# Patient Record
Sex: Female | Born: 1960 | Race: Asian | Hispanic: No | Smoking: Never smoker
Health system: Southern US, Community
[De-identification: ages and names within clinical notes are randomized; demographics above are authoritative.]

## PROBLEM LIST (undated history)

## (undated) DIAGNOSIS — F329 Major depressive disorder, single episode, unspecified: Secondary | ICD-10-CM

## (undated) DIAGNOSIS — I5042 Chronic combined systolic (congestive) and diastolic (congestive) heart failure: Secondary | ICD-10-CM

## (undated) DIAGNOSIS — I340 Nonrheumatic mitral (valve) insufficiency: Secondary | ICD-10-CM

## (undated) DIAGNOSIS — I469 Cardiac arrest, cause unspecified: Secondary | ICD-10-CM

## (undated) DIAGNOSIS — Z9889 Other specified postprocedural states: Secondary | ICD-10-CM

## (undated) DIAGNOSIS — IMO0001 Reserved for inherently not codable concepts without codable children: Secondary | ICD-10-CM

## (undated) DIAGNOSIS — F32A Depression, unspecified: Secondary | ICD-10-CM

## (undated) DIAGNOSIS — I719 Aortic aneurysm of unspecified site, without rupture: Secondary | ICD-10-CM

## (undated) DIAGNOSIS — M1990 Unspecified osteoarthritis, unspecified site: Secondary | ICD-10-CM

## (undated) DIAGNOSIS — D649 Anemia, unspecified: Secondary | ICD-10-CM

## (undated) DIAGNOSIS — Q250 Patent ductus arteriosus: Secondary | ICD-10-CM

## (undated) DIAGNOSIS — Z95810 Presence of automatic (implantable) cardiac defibrillator: Secondary | ICD-10-CM

## (undated) DIAGNOSIS — F419 Anxiety disorder, unspecified: Secondary | ICD-10-CM

## (undated) HISTORY — DX: Aortic aneurysm of unspecified site, without rupture: I71.9

## (undated) HISTORY — DX: Chronic combined systolic (congestive) and diastolic (congestive) heart failure: I50.42

## (undated) HISTORY — PX: OVARIAN CYST REMOVAL: SHX89

## (undated) HISTORY — DX: Nonrheumatic mitral (valve) insufficiency: I34.0

## (undated) HISTORY — PX: COLONOSCOPY: SHX174

---

## 2005-01-12 ENCOUNTER — Other Ambulatory Visit: Admission: RE | Admit: 2005-01-12 | Discharge: 2005-01-12 | Payer: Self-pay | Admitting: Obstetrics and Gynecology

## 2010-05-16 ENCOUNTER — Emergency Department (HOSPITAL_COMMUNITY)
Admission: EM | Admit: 2010-05-16 | Discharge: 2010-05-16 | Payer: Self-pay | Source: Home / Self Care | Admitting: Emergency Medicine

## 2011-03-12 ENCOUNTER — Ambulatory Visit
Admission: RE | Admit: 2011-03-12 | Discharge: 2011-03-12 | Disposition: A | Discharge status: Home / Self Care | Payer: No Typology Code available for payment source | Source: Ambulatory Visit | Attending: Chiropractic Medicine | Admitting: Chiropractic Medicine

## 2011-03-12 ENCOUNTER — Other Ambulatory Visit: Payer: Self-pay | Admitting: Chiropractic Medicine

## 2011-03-12 DIAGNOSIS — M545 Low back pain, unspecified: Secondary | ICD-10-CM

## 2011-03-12 DIAGNOSIS — M542 Cervicalgia: Secondary | ICD-10-CM

## 2013-07-04 ENCOUNTER — Other Ambulatory Visit (HOSPITAL_COMMUNITY): Payer: Self-pay | Admitting: *Deleted

## 2013-07-04 DIAGNOSIS — Z1231 Encounter for screening mammogram for malignant neoplasm of breast: Secondary | ICD-10-CM

## 2013-07-20 ENCOUNTER — Ambulatory Visit (HOSPITAL_COMMUNITY): Payer: Self-pay

## 2013-07-23 ENCOUNTER — Encounter (HOSPITAL_COMMUNITY): Payer: Self-pay

## 2013-07-25 ENCOUNTER — Other Ambulatory Visit (HOSPITAL_COMMUNITY): Payer: Self-pay | Admitting: *Deleted

## 2013-07-25 ENCOUNTER — Ambulatory Visit (HOSPITAL_COMMUNITY)
Admission: RE | Admit: 2013-07-25 | Discharge: 2013-07-25 | Disposition: A | Discharge status: Home / Self Care | Payer: BC Managed Care – PPO | Source: Ambulatory Visit | Attending: Cardiovascular Disease | Admitting: Cardiovascular Disease

## 2013-07-25 DIAGNOSIS — Z1231 Encounter for screening mammogram for malignant neoplasm of breast: Secondary | ICD-10-CM

## 2013-07-31 ENCOUNTER — Encounter: Payer: Self-pay | Admitting: Cardiovascular Disease

## 2013-07-31 ENCOUNTER — Ambulatory Visit (HOSPITAL_COMMUNITY): Payer: BC Managed Care – PPO | Attending: Cardiovascular Disease | Admitting: Radiology

## 2013-07-31 VITALS — BP 152/83 | HR 72 | Ht 62.0 in | Wt 135.0 lb

## 2013-07-31 DIAGNOSIS — R0989 Other specified symptoms and signs involving the circulatory and respiratory systems: Secondary | ICD-10-CM | POA: Insufficient documentation

## 2013-07-31 DIAGNOSIS — Z8249 Family history of ischemic heart disease and other diseases of the circulatory system: Secondary | ICD-10-CM | POA: Insufficient documentation

## 2013-07-31 DIAGNOSIS — R0789 Other chest pain: Secondary | ICD-10-CM | POA: Insufficient documentation

## 2013-07-31 DIAGNOSIS — R42 Dizziness and giddiness: Secondary | ICD-10-CM | POA: Insufficient documentation

## 2013-07-31 DIAGNOSIS — R0609 Other forms of dyspnea: Secondary | ICD-10-CM | POA: Insufficient documentation

## 2013-07-31 DIAGNOSIS — R002 Palpitations: Secondary | ICD-10-CM | POA: Insufficient documentation

## 2013-07-31 DIAGNOSIS — E785 Hyperlipidemia, unspecified: Secondary | ICD-10-CM | POA: Insufficient documentation

## 2013-07-31 DIAGNOSIS — R0602 Shortness of breath: Secondary | ICD-10-CM

## 2013-07-31 DIAGNOSIS — R079 Chest pain, unspecified: Secondary | ICD-10-CM

## 2013-07-31 MED ORDER — TECHNETIUM TC 99M SESTAMIBI GENERIC - CARDIOLITE
10.0000 | Freq: Once | INTRAVENOUS | Status: AC | PRN
  Administered 2013-07-31: 10 via INTRAVENOUS

## 2013-07-31 MED ORDER — REGADENOSON 0.4 MG/5ML IV SOLN
0.4000 mg | Freq: Once | INTRAVENOUS | Status: AC
  Administered 2013-07-31: 0.4 mg via INTRAVENOUS

## 2013-07-31 MED ORDER — TECHNETIUM TC 99M SESTAMIBI GENERIC - CARDIOLITE
30.0000 | Freq: Once | INTRAVENOUS | Status: AC | PRN
  Administered 2013-07-31: 30 via INTRAVENOUS

## 2013-07-31 NOTE — Progress Notes (Signed)
[facility] SITE 3 NUCLEAR MED [address] , [place] [address] [phone]    Cardiology Nuclear Med Study  Karn CassisJina Sayavong is a 53 y.o. female     MRN : [id number]     DOB: 1961-05-29  Procedure Date: 07/31/2013  Nuclear Med Background Indication for Stress Test:  Evaluation for Ischemia History:  No known CAD Cardiac Risk Factors: Family History - CAD and Lipids  Symptoms:  Chest Pain, Chest Pain with Exertion (last date of chest discomfort was one month ago), Dizziness, DOE and Palpitations   Nuclear Pre-Procedure Caffeine/Decaff Intake:  None > 12 hrs NPO After: 8:00pm   Lungs:  clear O2 Sat: 98% on room air. IV 0.9% NS with Angio Cath:  22g  IV Site: R Antecubital x 1, tolerated well IV Started by:  Irean HongPatsy Edwards, RN  Chest Size (in):  36 Cup Size: A  Height: 5\' 2"  (1.575 m)  Weight:  135 lb (61.236 kg)  BMI:  Body mass index is 24.69 kg/(m^2). Tech Comments:  N/A    Nuclear Med Study 1 or 2 day study: 1 day  Stress Test Type:  Lexiscan  Reading MD: N/A  Order Authorizing Provider:  Silvestre MomentKim Hongjae, MD  Resting Radionuclide: Technetium 9634m Sestamibi  Resting Radionuclide Dose: 11.0 mCi   Stress Radionuclide:  Technetium 4034m Sestamibi  Stress Radionuclide Dose: 33.0 mCi           Stress Protocol Rest HR: 72 Stress HR: 105  Rest BP: 152/83 Stress BP: 97/58  Exercise Time (min): n/a METS: n/a           Dose of Adenosine (mg):  n/a Dose of Lexiscan: 0.4 mg  Dose of Atropine (mg): n/a Dose of Dobutamine: n/a mcg/kg/min (at max HR)  Stress Test Technologist: Nelson ChimesSharon Brooks, BS-ES  Nuclear Technologist:  Domenic PoliteStephen Carbone, CNMT     Rest Procedure:  Myocardial perfusion imaging was performed at rest 45 minutes following the intravenous administration of Technetium 6134m Sestamibi. Rest ECG: Sinus rhythm, limb lead reversal, cannot R/O prior anterior MI  Stress Procedure:  The patient received IV Lexiscan 0.4 mg over 15-seconds.  Technetium 1934m Sestamibi  injected at 30-seconds.  Quantitative spect images were obtained after a 45 minute delay.  Attempted to stress patient with the Bruce Protocol.  The patient became SOB, fatigued, leg fatigue and lightheaded.  She did not feel she could keep going.  Sat patient in the chair due to lightheadedness and continued with the Lexiscan stress.  During the infusion of Lexiscan, the patient complained of SOB.  This resolved in recovery as well as all the other symptoms she had while on the treadmill.  Stress ECG: No significant ST segment change suggestive of ischemia.  QPS Raw Data Images:  Acquisition technically good; normal left ventricular size. Stress Images:  Normal homogeneous uptake in all areas of the myocardium. Rest Images:  Normal homogeneous uptake in all areas of the myocardium. Subtraction (SDS):  No evidence of ischemia. Transient Ischemic Dilatation (Normal <1.22):  1.05 Lung/Heart Ratio (Normal <0.45):  0.39  Quantitative Gated Spect Images QGS EDV:  77 ml QGS ESV:  33 ml  Impression Exercise Capacity:  Lexiscan with no exercise. BP Response:  Normal blood pressure response. Clinical Symptoms:  There is dyspnea. ECG Impression:  No significant ST segment change suggestive of ischemia. Comparison with Prior Nuclear Study: No images to compare  Overall Impression:  Normal stress nuclear study.  LV Ejection Fraction: 57%.  LV Wall Motion:  NL LV Function; NL Wall Motion  Diane [name]

## 2013-08-01 ENCOUNTER — Encounter (HOSPITAL_COMMUNITY): Payer: Self-pay | Admitting: Radiology

## 2013-08-01 NOTE — Progress Notes (Signed)
Nuclear report faxed to Dr. Silvestre Moment at [phone]. Burna Mortimer Rollande Thursby RT-N

## 2013-08-02 NOTE — Progress Notes (Signed)
Quick Note:  Became disconnected during informing patient of her Myoview results. Called her back twice without successfully speaking with her again but [facility] for results. ______

## 2014-03-06 ENCOUNTER — Ambulatory Visit
Admission: RE | Admit: 2014-03-06 | Discharge: 2014-03-06 | Disposition: A | Discharge status: Home / Self Care | Payer: BC Managed Care – PPO | Source: Ambulatory Visit | Attending: Family Medicine | Admitting: Family Medicine

## 2014-03-06 ENCOUNTER — Other Ambulatory Visit: Payer: Self-pay | Admitting: Family Medicine

## 2014-03-06 ENCOUNTER — Other Ambulatory Visit (HOSPITAL_COMMUNITY)
Admission: RE | Admit: 2014-03-06 | Discharge: 2014-03-06 | Disposition: A | Discharge status: Home / Self Care | Payer: BC Managed Care – PPO | Source: Ambulatory Visit | Attending: Family Medicine | Admitting: Family Medicine

## 2014-03-06 DIAGNOSIS — Z1151 Encounter for screening for human papillomavirus (HPV): Secondary | ICD-10-CM | POA: Diagnosis present

## 2014-03-06 DIAGNOSIS — R05 Cough: Secondary | ICD-10-CM

## 2014-03-06 DIAGNOSIS — Z124 Encounter for screening for malignant neoplasm of cervix: Secondary | ICD-10-CM | POA: Diagnosis present

## 2014-03-06 DIAGNOSIS — R059 Cough, unspecified: Secondary | ICD-10-CM

## 2014-03-07 LAB — CYTOLOGY - PAP

## 2014-03-19 ENCOUNTER — Other Ambulatory Visit: Payer: Self-pay | Admitting: Gastroenterology

## 2014-09-11 ENCOUNTER — Other Ambulatory Visit (HOSPITAL_COMMUNITY): Payer: Self-pay | Admitting: Family Medicine

## 2014-09-11 DIAGNOSIS — Z1231 Encounter for screening mammogram for malignant neoplasm of breast: Secondary | ICD-10-CM

## 2014-09-13 ENCOUNTER — Ambulatory Visit (HOSPITAL_COMMUNITY)
Admission: RE | Admit: 2014-09-13 | Discharge: 2014-09-13 | Disposition: A | Discharge status: Home / Self Care | Payer: 59 | Source: Ambulatory Visit | Attending: Family Medicine | Admitting: Family Medicine

## 2014-09-13 DIAGNOSIS — Z1231 Encounter for screening mammogram for malignant neoplasm of breast: Secondary | ICD-10-CM | POA: Insufficient documentation

## 2014-10-14 ENCOUNTER — Other Ambulatory Visit (HOSPITAL_COMMUNITY): Payer: Self-pay | Admitting: Radiology

## 2014-10-14 DIAGNOSIS — R0602 Shortness of breath: Secondary | ICD-10-CM

## 2014-11-05 ENCOUNTER — Ambulatory Visit (HOSPITAL_COMMUNITY)
Admission: RE | Admit: 2014-11-05 | Discharge: 2014-11-05 | Disposition: A | Discharge status: Home / Self Care | Payer: 59 | Source: Ambulatory Visit | Attending: Family Medicine | Admitting: Family Medicine

## 2014-11-05 DIAGNOSIS — R0602 Shortness of breath: Secondary | ICD-10-CM | POA: Insufficient documentation

## 2014-11-05 LAB — PULMONARY FUNCTION TEST
FEF 25-75 PRE: 1.7 L/s
FEF 25-75 Post: 2.1 L/sec
FEF2575-%CHANGE-POST: 23 %
FEF2575-%Pred-Post: 88 %
FEF2575-%Pred-Pre: 71 %
FEV1-%Change-Post: 6 %
FEV1-%Pred-Post: 83 %
FEV1-%Pred-Pre: 78 %
FEV1-Post: 2.02 L
FEV1-Pre: 1.9 L
FEV1FVC-%Change-Post: 4 %
FEV1FVC-%Pred-Pre: 100 %
FEV6-%Change-Post: 2 %
FEV6-%PRED-PRE: 79 %
FEV6-%Pred-Post: 81 %
FEV6-POST: 2.45 L
FEV6-Pre: 2.4 L
FEV6FVC-%PRED-POST: 103 %
FEV6FVC-%Pred-Pre: 103 %
FVC-%CHANGE-POST: 2 %
FVC-%Pred-Post: 78 %
FVC-%Pred-Pre: 77 %
FVC-PRE: 2.4 L
FVC-Post: 2.45 L
PRE FEV1/FVC RATIO: 79 %
Post FEV1/FVC ratio: 82 %
Post FEV6/FVC ratio: 100 %
Pre FEV6/FVC Ratio: 100 %

## 2014-11-05 MED ORDER — METHACHOLINE 1 MG/ML NEB SOLN
2.0000 mL | Freq: Once | RESPIRATORY_TRACT | Status: AC
  Administered 2014-11-05: 2 mg via RESPIRATORY_TRACT

## 2014-11-05 MED ORDER — METHACHOLINE 0.25 MG/ML NEB SOLN
2.0000 mL | Freq: Once | RESPIRATORY_TRACT | Status: AC
  Administered 2014-11-05: 0.5 mg via RESPIRATORY_TRACT

## 2014-11-05 MED ORDER — METHACHOLINE 0.0625 MG/ML NEB SOLN
2.0000 mL | Freq: Once | RESPIRATORY_TRACT | Status: AC
  Administered 2014-11-05: 0.125 mg via RESPIRATORY_TRACT

## 2014-11-05 MED ORDER — METHACHOLINE 16 MG/ML NEB SOLN
2.0000 mL | Freq: Once | RESPIRATORY_TRACT | Status: AC
  Administered 2014-11-05: 32 mg via RESPIRATORY_TRACT

## 2014-11-05 MED ORDER — METHACHOLINE 4 MG/ML NEB SOLN
2.0000 mL | Freq: Once | RESPIRATORY_TRACT | Status: AC
  Administered 2014-11-05: 8 mg via RESPIRATORY_TRACT

## 2014-11-05 MED ORDER — SODIUM CHLORIDE 0.9 % IN NEBU
3.0000 mL | INHALATION_SOLUTION | Freq: Once | RESPIRATORY_TRACT | Status: AC
  Administered 2014-11-05: 3 mL via RESPIRATORY_TRACT

## 2014-11-05 MED ORDER — ALBUTEROL SULFATE (2.5 MG/3ML) 0.083% IN NEBU
2.5000 mg | INHALATION_SOLUTION | Freq: Once | RESPIRATORY_TRACT | Status: AC
  Administered 2014-11-05: 2.5 mg via RESPIRATORY_TRACT

## 2014-11-15 ENCOUNTER — Other Ambulatory Visit: Payer: Self-pay | Admitting: Family Medicine

## 2014-11-15 ENCOUNTER — Ambulatory Visit
Admission: RE | Admit: 2014-11-15 | Discharge: 2014-11-15 | Disposition: A | Discharge status: Home / Self Care | Payer: 59 | Source: Ambulatory Visit | Attending: Family Medicine | Admitting: Family Medicine

## 2014-11-15 DIAGNOSIS — R202 Paresthesia of skin: Secondary | ICD-10-CM

## 2014-11-15 DIAGNOSIS — R209 Unspecified disturbances of skin sensation: Principal | ICD-10-CM

## 2014-11-21 ENCOUNTER — Emergency Department (HOSPITAL_COMMUNITY)
Admission: EM | Admit: 2014-11-21 | Discharge: 2014-11-22 | Disposition: A | Discharge status: Home / Self Care | Payer: 59 | Attending: Emergency Medicine | Admitting: Emergency Medicine

## 2014-11-21 ENCOUNTER — Encounter (HOSPITAL_COMMUNITY): Payer: Self-pay | Admitting: Emergency Medicine

## 2014-11-21 ENCOUNTER — Emergency Department (HOSPITAL_COMMUNITY): Payer: 59

## 2014-11-21 DIAGNOSIS — R079 Chest pain, unspecified: Secondary | ICD-10-CM | POA: Diagnosis not present

## 2014-11-21 DIAGNOSIS — R55 Syncope and collapse: Secondary | ICD-10-CM

## 2014-11-21 DIAGNOSIS — R0602 Shortness of breath: Secondary | ICD-10-CM

## 2014-11-21 LAB — CBC
HEMATOCRIT: 38.6 % (ref 36.0–46.0)
HEMOGLOBIN: 13 g/dL (ref 12.0–15.0)
MCH: 31.8 pg (ref 26.0–34.0)
MCHC: 33.7 g/dL (ref 30.0–36.0)
MCV: 94.4 fL (ref 78.0–100.0)
Platelets: 224 10*3/uL (ref 150–400)
RBC: 4.09 MIL/uL (ref 3.87–5.11)
RDW: 12.1 % (ref 11.5–15.5)
WBC: 8.7 10*3/uL (ref 4.0–10.5)

## 2014-11-21 LAB — I-STAT TROPONIN, ED: Troponin i, poc: 0.01 ng/mL (ref 0.00–0.08)

## 2014-11-21 NOTE — ED Notes (Signed)
Pt. reports syncopal episode this evening with central chest pain , SOB , nausea and dizziness . Alert and oriented at arrival / speech clear with no facial asymmetry. Denies diaphoresis .

## 2014-11-22 LAB — BASIC METABOLIC PANEL
Anion gap: 10 (ref 5–15)
BUN: 14 mg/dL (ref 6–20)
CALCIUM: 9.4 mg/dL (ref 8.9–10.3)
CHLORIDE: 95 mmol/L — AB (ref 101–111)
CO2: 26 mmol/L (ref 22–32)
Creatinine, Ser: 0.71 mg/dL (ref 0.44–1.00)
GFR calc Af Amer: >60 mL/min (ref >60)
GFR calc non Af Amer: >60 mL/min (ref >60)
Glucose, Bld: 110 mg/dL — ABNORMAL HIGH (ref 70–99)
Potassium: 4.2 mmol/L (ref 3.5–5.1)
Sodium: 131 mmol/L — ABNORMAL LOW (ref 135–145)

## 2014-11-22 LAB — URINALYSIS, ROUTINE W REFLEX MICROSCOPIC
Bilirubin Urine: NEGATIVE
Glucose, UA: NEGATIVE mg/dL
Hgb urine dipstick: NEGATIVE
Ketones, ur: NEGATIVE mg/dL
NITRITE: NEGATIVE
Protein, ur: NEGATIVE mg/dL
Specific Gravity, Urine: 1.01 (ref 1.005–1.030)
UROBILINOGEN UA: 0.2 mg/dL (ref 0.0–1.0)
pH: 7.5 (ref 5.0–8.0)

## 2014-11-22 LAB — URINE MICROSCOPIC-ADD ON

## 2014-11-22 LAB — I-STAT TROPONIN, ED: Troponin i, poc: 0 ng/mL (ref 0.00–0.08)

## 2014-11-22 LAB — D-DIMER, QUANTITATIVE (NOT AT ARMC): D DIMER QUANT: 0.42 ug{FEU}/mL (ref 0.00–0.48)

## 2014-11-22 LAB — TROPONIN I: Troponin I: <0.03 ng/mL (ref <0.031)

## 2014-11-22 LAB — BRAIN NATRIURETIC PEPTIDE: B Natriuretic Peptide: 196.6 pg/mL — ABNORMAL HIGH (ref 0.0–100.0)

## 2014-11-22 MED ORDER — ALBUTEROL SULFATE HFA 108 (90 BASE) MCG/ACT IN AERS
2.0000 | INHALATION_SPRAY | RESPIRATORY_TRACT | Status: DC
  Administered 2014-11-22: 2 via RESPIRATORY_TRACT
  Filled 2014-11-22: qty 6.7

## 2014-11-22 MED ORDER — ONDANSETRON HCL 4 MG/2ML IJ SOLN
4.0000 mg | Freq: Once | INTRAMUSCULAR | Status: AC
  Administered 2014-11-22: 4 mg via INTRAVENOUS
  Filled 2014-11-22: qty 2

## 2014-11-22 NOTE — ED Notes (Addendum)
Pt helped to bedside commode

## 2014-11-22 NOTE — Discharge Instructions (Signed)

## 2014-11-22 NOTE — ED Provider Notes (Signed)
CSN: [id number]     Arrival date & time 11/21/14  2313 History  This chart was scribed for Azalia Bilis, MD by Bronson Curb, ED Scribe. This patient was seen in room A10C/A10C and the patient's care was started at 12:23 AM.    Chief Complaint  Patient presents with  . Loss of Consciousness  . Chest Pain    The history is provided by the patient and a relative. No language interpreter was used.     HPI Comments: Diane [name] is a 54 y.o. female, with no significant medical history, who presents to the Emergency Department complaining of a syncopal episode that occurred 40 minutes ago and lasted for approximately 5 minutes. Patient states she was dancing for a short period of time when she suddenly felt as if she could not move, her vision became "white", and she subsequently "fainted". Patient was informed by witnesses that she became unresponsive and her eyes were open. Upon regaining consciousness, patient states she felt fine and proceeded to go home. She states began to develop l chest pain described as pressure and SOB which prompted her to come to the ED. Patient is unable to recall chest pain prior to syncopal episode. She also reports associated lightheadedness and nausea. Patient mentions mild dysuria for the past 2 days, but states "it is nothing serious". Per sister, patient has been experiencing prior similar episodes for the past 8 months, but reports this episode is worse. Sister states that, for the past 2 weeks.  Patient has been eating and drinking normally. Patient states she had a stress test, last year, when she developed severe chest pain after walking up 2 flights of stairs. Noted to be normal per pt. She reports maternal history of MI. She denies fever, tongue biting, urinary incontinence, melena, or hematochezia, abdominal pain, or vomiting.  History reviewed. No pertinent past medical history. History reviewed. No pertinent past surgical history. Family history: Mother  with MI  History  Substance Use Topics  . Smoking status: Never Smoker   . Smokeless tobacco: Not on file  . Alcohol Use: No   OB History    No data available     Review of Systems  A complete 10 system review of systems was obtained and all systems are negative except as noted in the HPI and PMH.    Allergies  Vicodin  Home Medications   Prior to Admission medications   Medication Sig Start Date End Date Taking? Authorizing Provider  PREMARIN vaginal cream Place 1 Applicatorful vaginally 2 (two) times a week.  10/14/14  Yes Historical Provider, MD   Triage Vitals: BP 107/83 mmHg  Pulse 93  Temp(Src) 97.8 F (36.6 C) (Oral)  Resp 14  SpO2 100%  Physical Exam  Constitutional: She is oriented to person, place, and time. She appears well-developed and well-nourished. No distress.  HENT:  Head: Normocephalic and atraumatic.  Eyes: EOM are normal.  Neck: Normal range of motion.  Cardiovascular: Normal rate, regular rhythm and normal heart sounds.   Pulmonary/Chest: Effort normal and breath sounds normal.  Abdominal: Soft. She exhibits no distension. There is no tenderness.  Musculoskeletal: Normal range of motion.  Neurological: She is alert and oriented to person, place, and time.  Skin: Skin is warm and dry.  Psychiatric: She has a normal mood and affect. Judgment normal.  Nursing note and vitals reviewed.   ED Course  Procedures (including critical care time)  DIAGNOSTIC STUDIES: Oxygen Saturation is 100% on room air, normal  by my interpretation.    COORDINATION OF CARE: At 0034 Discussed treatment plan with patient which includes labs, CXR, and O2 therapy. Patient agrees.   Labs Review Labs Reviewed  BASIC METABOLIC PANEL - Abnormal; Notable for the following:    Sodium 131 (*)    Chloride 95 (*)    Glucose, Bld 110 (*)    All other components within normal limits  BRAIN NATRIURETIC PEPTIDE - Abnormal; Notable for the following:    B Natriuretic Peptide  196.6 (*)    All other components within normal limits  URINALYSIS, ROUTINE W REFLEX MICROSCOPIC - Abnormal; Notable for the following:    Leukocytes, UA TRACE (*)    All other components within normal limits  URINE MICROSCOPIC-ADD ON - Abnormal; Notable for the following:    Bacteria, UA FEW (*)    All other components within normal limits  CBC  D-DIMER, QUANTITATIVE  TROPONIN I  I-STAT TROPOININ, ED  I-STAT TROPOININ, ED    Imaging Review Dg Chest 2 View  11/22/2014   CLINICAL DATA:  Shortness of breath and LEFT-sided chest pain beginning this morning. Weakness.  EXAM: CHEST  2 VIEW  COMPARISON:  Chest radiograph March 06, 2014  FINDINGS: The cardiac silhouette appears upper limits of normal in size, mediastinal silhouette is nonsuspicious. Mild interstitial prominence without pleural effusion or focal consolidation. No pneumothorax. Soft tissue planes and included osseous structure nonsuspicious.  IMPRESSION: Borderline cardiomegaly. Mild interstitial prominence suggests atypical infection, less likely pulmonary edema without focal consolidation.   Electronically Signed   By: Awilda Metro   On: 11/22/2014 00:35     EKG Interpretation   Date/Time:  Friday Nov 22 2014 00:37:53 EDT Ventricular Rate:  82 PR Interval:  191 QRS Duration: 97 QT Interval:  420 QTC Calculation: 490 R Axis:   -89 Text Interpretation:  Sinus rhythm Left anterior fascicular block  nonspecific ST and T wave changes Borderline prolonged QT interval No old  tracing to compare Confirmed by Dayle Mcnerney  MD, Caryn Bee ([id number]) on 11/22/2014  12:55:53 AM      MDM   Final diagnoses:  Chest pain, unspecified chest pain type  Syncope, unspecified syncope type    Nonspecific complaints.  Patient reports cough for 3 years.  She's had intermittent no chest pain shortness of breath.  She's been worked up with an outpatient stress test.  She definitely needs to follow-up with a cardiologist and she will need an  echocardiogram as outpatient.  Troponin 2 is negative.  Patient is asymptomatic at this time.  She was having shortness of breath during my evaluation of the bedside initially.  EKG repeated at that time without ischemic changes and pulse oximeter was 99%.  No increased work of breathing.  She did feel better after bronchodilators at that time.  Some this could represent bronchospasm although no overt wheezing was noted on initial examination.  D-dimer is normal.  Patient understands the importance of ongoing close primary care follow-up in outpatient cardiology follow-up.  She understands return the emergency department for new or worsening symptoms.  I personally performed the services described in this documentation, which was scribed in my presence. The recorded information has been reviewed and is accurate.      Azalia Bilis, MD 11/22/14 [phone]

## 2014-11-22 NOTE — ED Notes (Signed)
Ambulated pt on the unit no complaints noted at this time upon returning to the room O2 saturations remained at 99%

## 2015-01-01 ENCOUNTER — Encounter: Payer: Self-pay | Admitting: Neurology

## 2015-01-01 ENCOUNTER — Ambulatory Visit (INDEPENDENT_AMBULATORY_CARE_PROVIDER_SITE_OTHER): Payer: 59 | Admitting: Neurology

## 2015-01-01 VITALS — BP 90/64 | HR 77 | Resp 16 | Ht 61.5 in | Wt 118.0 lb

## 2015-01-01 DIAGNOSIS — G513 Clonic hemifacial spasm: Secondary | ICD-10-CM | POA: Diagnosis not present

## 2015-01-01 DIAGNOSIS — R55 Syncope and collapse: Secondary | ICD-10-CM | POA: Insufficient documentation

## 2015-01-01 DIAGNOSIS — G5139 Clonic hemifacial spasm, unspecified: Secondary | ICD-10-CM | POA: Insufficient documentation

## 2015-01-01 NOTE — Patient Instructions (Signed)
1. Schedule MRI brain with and without contrast, attention to facial nerve 2. Schedule routine EEG, if normal we will plan for 24-hour EEG 3. As per [place] driving laws, after an episode of loss of consciousness, one should not drive until 6 months event-free 4. Follow-up with Cardiology as scheduled 5. Follow-up after tests
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
## 2015-01-13 ENCOUNTER — Ambulatory Visit (HOSPITAL_COMMUNITY): Admission: RE | Admit: 2015-01-13 | Payer: 59 | Source: Ambulatory Visit

## 2015-01-14 ENCOUNTER — Ambulatory Visit (HOSPITAL_COMMUNITY)
Admission: RE | Admit: 2015-01-14 | Discharge: 2015-01-14 | Disposition: A | Discharge status: Home / Self Care | Payer: 59 | Source: Ambulatory Visit | Attending: Neurology | Admitting: Neurology

## 2015-01-14 DIAGNOSIS — R29810 Facial weakness: Secondary | ICD-10-CM | POA: Diagnosis not present

## 2015-01-14 DIAGNOSIS — R55 Syncope and collapse: Secondary | ICD-10-CM | POA: Diagnosis not present

## 2015-01-14 DIAGNOSIS — G513 Clonic hemifacial spasm: Secondary | ICD-10-CM | POA: Insufficient documentation

## 2015-01-14 DIAGNOSIS — R42 Dizziness and giddiness: Secondary | ICD-10-CM | POA: Diagnosis not present

## 2015-01-14 MED ORDER — GADOBENATE DIMEGLUMINE 529 MG/ML IV SOLN: 10.0000 mL | Freq: Once | INTRAVENOUS | Status: AC | PRN

## 2015-01-15 ENCOUNTER — Ambulatory Visit (INDEPENDENT_AMBULATORY_CARE_PROVIDER_SITE_OTHER): Payer: 59 | Admitting: Neurology

## 2015-01-15 DIAGNOSIS — G513 Clonic hemifacial spasm: Secondary | ICD-10-CM

## 2015-01-15 DIAGNOSIS — R55 Syncope and collapse: Secondary | ICD-10-CM | POA: Diagnosis not present

## 2015-01-15 DIAGNOSIS — G5139 Clonic hemifacial spasm, unspecified: Secondary | ICD-10-CM

## 2015-01-15 NOTE — Procedures (Signed)
ELECTROENCEPHALOGRAM REPORT  Date of Study: 01/15/2015  Patient's Name: Diane [name] MRN: [id number] Date of Birth: [birth year]  Referring Provider: Dr. Patrcia Dolly  Clinical History: This is a 54 year old woman with recurrent episodes of right facial drooping and tightening, increased in frequency in the past 6 months, now occurring weekly and lasting up to a week  Medications: Premarin  Technical Summary: A multichannel digital EEG recording measured by the international 10-20 system with electrodes applied with paste and impedances below 5000 ohms performed in our laboratory with EKG monitoring in an awake and asleep patient.  Hyperventilation and photic stimulation were performed.  The digital EEG was referentially recorded, reformatted, and digitally filtered in a variety of bipolar and referential montages for optimal display.    Description: The patient is awake and asleep during the recording.  During maximal wakefulness, there is a symmetric, medium voltage 10.5-11 Hz posterior dominant rhythm that attenuates with eye opening.  The record is symmetric.  During drowsiness and sleep, there is an increase in theta slowing of the background.  Vertex waves and symmetric sleep spindles were seen.  Hyperventilation and photic stimulation did not elicit any abnormalities.  There were no epileptiform discharges or electrographic seizures seen.    EKG lead was unremarkable.  Impression: This awake and asleep EEG is normal.    Clinical Correlation: A normal EEG does not exclude a clinical diagnosis of epilepsy.  If further clinical questions remain, prolonged EEG may be helpful.  Clinical correlation is advised.   Patrcia Dolly, M.D.

## 2015-01-16 ENCOUNTER — Telehealth: Payer: Self-pay | Admitting: Family Medicine

## 2015-01-16 NOTE — Telephone Encounter (Signed)
Patient was notified of results. Will proceed with 24 hour eeg.

## 2015-01-16 NOTE — Telephone Encounter (Signed)
-----   Message from Van Clines, MD sent at 01/15/2015  1:34 PM EDT ----- Pls let her know I reviewed MRI brain, it is normal, no evidence of tumor, stroke, or bleed. Her EEG showed normal brain waves. Thanks

## 2015-01-22 ENCOUNTER — Other Ambulatory Visit: Payer: Self-pay | Admitting: Family Medicine

## 2015-01-22 DIAGNOSIS — R55 Syncope and collapse: Secondary | ICD-10-CM

## 2015-01-31 ENCOUNTER — Encounter: Payer: Self-pay | Admitting: Neurology

## 2015-01-31 ENCOUNTER — Ambulatory Visit (INDEPENDENT_AMBULATORY_CARE_PROVIDER_SITE_OTHER): Payer: 59 | Admitting: Neurology

## 2015-01-31 VITALS — BP 110/72 | HR 84 | Resp 16 | Ht 61.5 in | Wt 118.0 lb

## 2015-01-31 DIAGNOSIS — R55 Syncope and collapse: Secondary | ICD-10-CM | POA: Diagnosis not present

## 2015-01-31 DIAGNOSIS — G513 Clonic hemifacial spasm: Secondary | ICD-10-CM

## 2015-01-31 DIAGNOSIS — G5139 Clonic hemifacial spasm, unspecified: Secondary | ICD-10-CM

## 2015-01-31 MED ORDER — BACLOFEN 10 MG PO TABS
ORAL_TABLET | ORAL | Status: DC
Start: 2015-01-31 — End: 2015-05-29

## 2015-01-31 NOTE — Patient Instructions (Addendum)
1. Start Baclofen 10mg : Take 1/2 tablet at bedtime. If not too drowsy, you may take an additional dose as needed during times of facial spasms 2. Proceed with prolonged EEG as scheduled 3. Follow-up in 6 weeks, call our office for any changes
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
## 2015-02-06 ENCOUNTER — Ambulatory Visit: Payer: 59 | Admitting: Neurology

## 2015-02-24 ENCOUNTER — Ambulatory Visit (INDEPENDENT_AMBULATORY_CARE_PROVIDER_SITE_OTHER): Payer: 59 | Admitting: Neurology

## 2015-02-24 DIAGNOSIS — R55 Syncope and collapse: Secondary | ICD-10-CM | POA: Diagnosis not present

## 2015-03-05 ENCOUNTER — Ambulatory Visit (INDEPENDENT_AMBULATORY_CARE_PROVIDER_SITE_OTHER): Payer: 59 | Admitting: Neurology

## 2015-03-05 ENCOUNTER — Encounter: Payer: Self-pay | Admitting: Neurology

## 2015-03-05 VITALS — BP 100/60 | HR 79 | Ht 62.0 in | Wt 115.0 lb

## 2015-03-05 DIAGNOSIS — R55 Syncope and collapse: Secondary | ICD-10-CM | POA: Diagnosis not present

## 2015-03-05 DIAGNOSIS — G513 Clonic hemifacial spasm: Secondary | ICD-10-CM

## 2015-03-05 DIAGNOSIS — G5139 Clonic hemifacial spasm, unspecified: Secondary | ICD-10-CM

## 2015-03-05 NOTE — Patient Instructions (Signed)
1. Continue to monitor symptoms 2. Follow-up in 6 months or earlier if needed

## 2015-03-05 NOTE — Progress Notes (Signed)
NEUROLOGY FOLLOW UP OFFICE NOTE  Diane [name] [id number]  HISTORY OF PRESENT ILLNESS: I had the pleasure of seeing Diane [name] in follow-up in the neurology clinic on 03/05/2015.  The patient was last seen a month ago for recurrent episodes of right facial symptoms that have increased in frequency. Records and images were personally reviewed where available. She had a 24-hour EEG which was normal, no epileptiform discharges. Typical events were not captured. She states the day after the EEG was taken off, she had a typical tightening episode on the right side of her face. She reports that she did not start Baclofen due to concern for possible side effects. She states she is not noticing the episodes as much, they are not as often or lasting as long. On average, they maybe occur 1-2 times a week, but not as intense. She feels that after being reassured by a normal MRI, she is not bothered by it so much and does not hyperfocus on her symptoms much. She is interested in acupuncture.   She denies any headaches, tinnitus, focal numbness/tingling/weakness in the extremities. No further episodes of loss of consciousness since May 2016.   HPI 01/01/15: This is a 54 yo LH woman with no significant past medical history with recurrent episodes of right-sided facial drooping. She reports symptoms started around 3-1/2 years ago, she would start having a tightening sensation on the right side of her face, then notice that her face would be "contorted." She would initially rub it and symptoms would resolve. It was occurring every once in a while until 6 months ago, when these started increasing in frequency to once a week, lasting 2-7 days. Instead of facial drooping, she noticed that her right frontalis region would be contracted up. She has been told that her eyebrows are not aligned, and had started wearing bangs to cover it up. She denies any associated headaches, but would feel lightheaded. There is no  associated speech difficulties, no confusion. She denies any associated arm or leg symptoms. One time, the left side of her face felt different. She has also been having a lot of dizziness recently, described as a spinning and floating sensation, leading up to 2 syncopal episodes a few months ago and most recently last 11/21/2014. During both times, she recalls feeling lightheaded, suddenly drained. Her vision became "white" then she passed out for a few minutes. She vomited after the first episode. With the second episode, she was told her eyes were open, then she had chest pain and shortness of breath when she came to. She was brought to [facility] ER, EKG showed sinus rhythm, left anterior fascicular block, nonspecific ST and T wave changes, borderline prolonged QT interval. Troponin x 2 negative, bloodwork showed normal CBC, her sodium was 131, otherwise normal. She denied having any facial symptoms during those periods.   She brings 2 phone videos of these events, the right frontalis muscle is seen contracting, and she is unable to move this as she lifts eyebrows up, no other facial weakness noted. She is speaking without any confusion seen. She also feels like the right side of her mouth is affected, not clearly seen on video. After the events she feels drained and depressed. She says the videos she shows her face is lifted up, but other times, the right side of her face is drooped down. Rarely, the left side of her face has similar symptoms.   Laboratory Data: 10/08/14 CMP normal, Na 138, ESR 10, TSH 2.12, ANA  negative, SS-A and SS-B negative I personally reviewed head CT without contrast which did not show any acute changes.  I personally reviewed MRI brain with and without contrast which is normal. Her routine EEG was normal, no epileptiform discharges or focal slowing seen.  PAST MEDICAL HISTORY: No past medical history on file.  MEDICATIONS: Current Outpatient Prescriptions on File Prior to Visit    Medication Sig Dispense Refill  . baclofen (LIORESAL) 10 MG tablet (not taking) Take 1/2 tablet at bedtime. 30 each 1  . PREMARIN vaginal cream Place 1 Applicatorful vaginally 2 (two) times a week.   5   No current facility-administered medications on file prior to visit.    ALLERGIES: Allergies  Allergen Reactions  . Vicodin [Hydrocodone-Acetaminophen] Other (See Comments)    "she feels out of it"    FAMILY HISTORY: No family history on file.  SOCIAL HISTORY: Social History   Social History  . Marital Status: Married    Spouse Name: N/A  . Number of Children: 2  . Years of Education: N/A   Occupational History  . Seamtress    Social History Main Topics  . Smoking status: Never Smoker   . Smokeless tobacco: Never Used  . Alcohol Use: No  . Drug Use: No  . Sexual Activity: Not on file   Other Topics Concern  . Not on file   Social History Narrative    REVIEW OF SYSTEMS: Constitutional: No fevers, chills, or sweats, no generalized fatigue, change in appetite Eyes: No visual changes, double vision, eye pain Ear, nose and throat: No hearing loss, ear pain, nasal congestion, sore throat Cardiovascular: No chest pain, palpitations Respiratory:  No shortness of breath at rest or with exertion, wheezes GastrointestinaI: No nausea, vomiting, diarrhea, abdominal pain, fecal incontinence Genitourinary:  No dysuria, urinary retention or frequency Musculoskeletal:  + neck pain, back pain Integumentary: No rash, pruritus, skin lesions Neurological: as above Psychiatric: No depression, insomnia, anxiety Endocrine: No palpitations, fatigue, diaphoresis, mood swings, change in appetite, change in weight, increased thirst Hematologic/Lymphatic:  No anemia, purpura, petechiae. Allergic/Immunologic: no itchy/runny eyes, nasal congestion, recent allergic reactions, rashes  PHYSICAL EXAM: Filed Vitals:   03/05/15 0905  BP: 100/60  Pulse: 79   General: No acute  distress Head:  Normocephalic/atraumatic Neck: supple, no paraspinal tenderness, full range of motion Heart:  Regular rate and rhythm Lungs:  Clear to auscultation bilaterally Back: No paraspinal tenderness Skin/Extremities: No rash, no edema Neurological Exam: alert and oriented to person, place, and time. No aphasia or dysarthria. Fund of knowledge is appropriate.  Recent and remote memory are intact.  Attention and concentration are normal.    Able to name objects and repeat phrases. Cranial nerves: Pupils equal, round, reactive to light.  Fundoscopic exam unremarkable, no papilledema. Extraocular movements intact with no nystagmus. Visual fields full. Facial sensation intact. No facial asymmetry with full facial movements. Tongue, uvula, palate midline.  Motor: Bulk and tone normal, muscle strength 5/5 throughout with no pronator drift.  Sensation to light touch intact.  No extinction to double simultaneous stimulation.  Deep tendon reflexes 2+ throughout, toes downgoing.  Finger to nose testing intact.  Gait narrow-based and steady, able to tandem walk adequately.  Romberg negative.  IMPRESSION: This is a 54 yo LH woman with a 3-1/2 year history of recurrent episodes of right facial drooping and tightening, which had increased in frequency since the beginning of the year. Videos she brings show right frontalis contraction. She reports feeling tired after. MRI  brain and routine/24-hour EEG normal. 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 She feels that with normal test results, she is paying less attention to the events, and that they occur less frequently and are shorter duration. She would like to hold off on starting any medication at this time. Options in the future for hemifacial spasms include baclofen, clonazepam, or carbamazepine. She denies any further syncopal episodes and is aware of [place] driving laws to stop driving after an episode of loss of consciousness, until 6  months event-free. She will continue to monitor symptoms and will follow-up in 6 months. She knows to call our office for any problems in the interim.   Thank you for allowing me to participate in her care.  Please do not hesitate to call for any questions or concerns.  The duration of this appointment visit was 15 minutes of face-to-face time with the patient.  Greater than 50% of this time was spent in counseling, explanation of diagnosis, planning of further management, and coordination of care.   Ellouise Newer, M.D.   CC: Dr. Stephanie Acre

## 2015-03-06 NOTE — Procedures (Signed)
ELECTROENCEPHALOGRAM REPORT  Dates of Recording: 02/24/2015 to 02/25/2015  Patient's Name: Diane [name] MRN: [id number] Date of Birth: 18-Jan-1961  Referring Provider: Dr. Patrcia Dolly  Procedure: 24-hour ambulatory EEG  History: This is a 53 year old woman with a 3-1/2 year history of recurrent episodes of right facial drooping and tightening, increased in frequency in the past 6 months, now occurring weekly and lasting up to a week. She also reports new onset dizziness and 2 syncopal episodes.  Medications: none  Technical Summary: This is a 24-hour multichannel digital EEG recording measured by the international 10-20 system with electrodes applied with paste and impedances below 5000 ohms performed as portable with EKG monitoring.  The digital EEG was referentially recorded, reformatted, and digitally filtered in a variety of bipolar and referential montages for optimal display.    DESCRIPTION OF RECORDING: During maximal wakefulness, the background activity consisted of a symmetric 10.5 Hz posterior dominant rhythm which was reactive to eye opening.  There were no epileptiform discharges or focal slowing seen in wakefulness.  During the recording, the patient progresses through wakefulness, drowsiness, and Stage 2 sleep.  Again, there were no epileptiform discharges seen.  Events: There were no push button events.   There were no electrographic seizures seen.  EKG lead was unremarkable.  IMPRESSION: This 24-hour ambulatory EEG study is normal.    CLINICAL CORRELATION: A normal EEG does not exclude a clinical diagnosis of epilepsy.  Typical events were not captured. If further clinical questions remain, inpatient video EEG monitoring may be helpful.   Patrcia Dolly, M.D.

## 2015-05-29 ENCOUNTER — Encounter: Payer: Self-pay | Admitting: Cardiovascular Disease

## 2015-05-29 ENCOUNTER — Ambulatory Visit (INDEPENDENT_AMBULATORY_CARE_PROVIDER_SITE_OTHER): Payer: 59 | Admitting: Cardiovascular Disease

## 2015-05-29 ENCOUNTER — Encounter (INDEPENDENT_AMBULATORY_CARE_PROVIDER_SITE_OTHER): Payer: 59

## 2015-05-29 VITALS — BP 106/72 | HR 77 | Ht 62.0 in | Wt 118.8 lb

## 2015-05-29 DIAGNOSIS — R0602 Shortness of breath: Secondary | ICD-10-CM

## 2015-05-29 DIAGNOSIS — R55 Syncope and collapse: Secondary | ICD-10-CM

## 2015-05-29 NOTE — Patient Instructions (Addendum)
Your physician has requested that you have an echocardiogram at [address]. Echocardiography is a painless test that uses sound waves to create images of your heart. It provides your doctor with information about the size and shape of your heart and how well your heart's chambers and valves are working. This procedure takes approximately one hour. There are no restrictions for this procedure.   Your physician has recommended that you wear a holter monitor 24 hour. Holter monitors are medical devices that record the heart's electrical activity. Doctors most often use these monitors to diagnose arrhythmias. Arrhythmias are problems with the speed or rhythm of the heartbeat. The monitor is a small, portable device. You can wear one while you do your normal daily activities. This is usually used to diagnose what is causing palpitations/syncope (passing out). RETURN TOMORROW AFTER 2 PM TO OFFICE.  Your physician recommends that you schedule a follow-up appointment in 1 month with Dr Duke Salvia.   Orthostatic Hypotension Orthostatic hypotension is a sudden drop in blood pressure. It happens when you quickly stand up from a seated or lying position. You may feel dizzy or light-headed. This can last for just a few seconds or for up to a few minutes. It is usually not a serious problem. However, if this happens frequently or gets worse, it can be a sign of something more serious. CAUSES  Different things can cause orthostatic hypotension, including:   Loss of body fluids (dehydration).  Medicines that lower blood pressure.  Sudden changes in posture, such as standing up quickly after you have been sitting or lying down.  Taking too much of your medicine. SIGNS AND SYMPTOMS   Light-headedness or dizziness.   Fainting or near-fainting.   A fast heart rate.   Weakness.   Feeling tired (fatigue).  DIAGNOSIS  Your health care provider may do several things to help  diagnose your condition and identify the cause. These may include:   Taking a medical history and doing a physical exam.  Checking your blood pressure. Your health care provider will check your blood pressure when you are:  Lying down.  Sitting.  Standing.  Using tilt table testing. In this test, you lie down on a table that moves from a lying position to a standing position. You will be strapped onto the table. This test monitors your blood pressure and heart rate when you are in different positions. TREATMENT  Treatment will vary depending on the cause. Possible treatments include:   Changing the dosage of your medicines.  Wearing compression stockings on your lower legs.  Standing up slowly after sitting or lying down.  Eating more salt.  Eating frequent, small meals.  In some cases, getting IV fluids.  Taking medicine to enhance fluid retention. HOME CARE INSTRUCTIONS  Only take over-the-counter or prescription medicines as directed by your health care provider.  Follow your health care provider's instructions for changing the dosage of your current medicines.  Do not stop or adjust your medicine on your own.  Stand up slowly after sitting or lying down. This allows your body to adjust to the different position.  Wear compression stockings as directed.  Eat extra salt as directed.  Do not add extra salt to your diet unless directed to by your health care provider.  Eat frequent, small meals.  Avoid standing suddenly after eating.  Avoid hot showers or excessive heat as directed by your health care provider.  Keep all follow-up appointments. SEEK MEDICAL CARE  IF:  You continue to feel dizzy or light-headed after standing.  You feel groggy or confused.  You feel cold, clammy, or sick to your stomach (nauseous).  You have blurred vision.  You feel short of breath. SEEK IMMEDIATE MEDICAL CARE IF:   You faint after standing.  You have chest  pain.  You have difficulty breathing.   You lose feeling or movement in your arms or legs.   You have slurred speech or difficulty talking, or you are unable to talk.  MAKE SURE YOU:   Understand these instructions.  Will watch your condition.  Will get help right away if you are not doing well or get worse.   This information is not intended to replace advice given to you by your health care provider. Make sure you discuss any questions you have with your health care provider.   Document Released: 06/25/2002 Document Revised: 07/10/2013 Document Reviewed: 04/27/2013 Elsevier Interactive Patient Education [employer]

## 2015-05-29 NOTE — Progress Notes (Signed)
Cardiology Office Note   Date:  05/29/2015   ID:  Diane [name], DOB [birth year], MRN [id number]  PCP:  Emeterio Reeve, MD  Cardiologist:   Madilyn Hook, MD   Chief Complaint  Patient presents with  . New Evaluation    pt c/o chest tightness/pressure, happens almost daily, lasts more than a few minutes//has had stress test, did not show anything  . Shortness of Breath    minimal exertion/stairs are hard to do//feels SOB all the time  . Fatigue    hard to fall asleep/once she falls asleep, she sleeps ok  . Dizziness    pt states she gets dizzy a lot/ PCP checked iron level//Syncope--pt fainted twice and lost consiousness in the span of a couple of months/has been hard to work, she has felt like she was going to faint at work several times  . Edema    legs and feet are usually always burning and swelling      History of Present Illness: Diane [name] is a 54 y.o. female who presents for an evaluation of syncope.  Diane [name] reports that her symptoms began approximately 1-1/2 years ago. In that time. She's had 2 episodes of syncope lasting up to 15 minutes. She's had lightheadedness and dizziness upon standing and lasts may have the most severe episode that resulted in syncope. She was evaluated in the emergency department, at which time her blood pressure and heart rate were normal.  Cardiac enzymes were negative and her EKG showed borderline QT prolongation, but was otherwise unremarkable.  D-dimer was negative. She was referred to follow-up with her primary care physician as well as cardiology. In the interim she also saw a neurologist, who did an MRI of the brain that was unremarkable.  She had an EEG to evaluate this event as well as recurrent episodes of right facial droop.  EEG was unremarkable.  Diane [name] has noted shortness of breath and lightheadedness when walking up an incline.  She has to sit down in order to avoid passing out.  These episodes are associated  with chest tightness.  The episodes last for a few minutes and improve with rest.  The tightness is 5-6/10 in severity.  It is sometimes associated with nausea but no diaphoresis. She has been unable to exercise due to these symptoms.  She had an exercise Myoview in January 2015 that was negative for ischemia.  She denies palpitations, lower extremity edema, orthopnea or PND.  Diane [name] is concerned because a friend had similar symptoms and was ultimately diagnosed with valvular heart disease requiring surgical intervention.  Diane [name] also notes that her taste buds have changed. She no longer like the taste of salty or sweet things. She's also had a cough for 4 years that is nonproductive and not associated with fever or chills.   No past medical history on file.  Past Surgical History  Procedure Laterality Date  . Ovarian cyst removal       No current outpatient prescriptions on file.   No current facility-administered medications for this visit.    Allergies:   Vicodin    Social History:  The patient  reports that she has never smoked. She has never used smokeless tobacco. She reports that she does not drink alcohol or use illicit drugs.   Family History:  The patient's family history is not on file.    ROS:  Please see the history of present illness.   Otherwise, review of systems are  positive for none.   All other systems are reviewed and negative.    PHYSICAL EXAM: VS:  BP 106/72 mmHg  Pulse 77  Ht  (1.575 m)  Wt 53.887 kg (118 lb 12.8 oz)  BMI 21.72 kg/m2 , BMI Body mass index is 21.72 kg/(m^2). GENERAL:  Well appearing HEENT:  Pupils equal round and reactive, fundi not visualized, oral mucosa unremarkable NECK:  No jugular venous distention, waveform within normal limits, carotid upstroke brisk and symmetric, no bruits, no thyromegaly LYMPHATICS:  No cervical adenopathy LUNGS:  Clear to auscultation bilaterally HEART:  RRR.  PMI not displaced or sustained,S1  and S2 within normal limits, no S3, no S4, no clicks, no rubs, no murmurs ABD:  Flat, positive bowel sounds normal in frequency in pitch, no bruits, no rebound, no guarding, no midline pulsatile mass, no hepatomegaly, no splenomegaly EXT:  2 plus pulses throughout, no edema, no cyanosis no clubbing SKIN:  No rashes no nodules NEURO:  Cranial nerves II through XII grossly intact, motor grossly intact throughout PSYCH:  Cognitively intact, oriented to person place and time    EKG:  EKG is ordered today. The ekg ordered today demsinus rhythm rate 77 bpm.  Right superior axis deviation. Possible prior lateral infarct.   Recent Labs: 11/21/2014: B Natriuretic Peptide 196.6*; BUN 14; Creatinine, Ser 0.71; Hemoglobin 13.0; Platelets 224; Potassium 4.2; Sodium 131*    Lipid Panel No results found for: CHOL, TRIG, HDL, CHOLHDL, VLDL, LDLCALC, LDLDIRECT    Wt Readings from Last 3 Encounters:  05/29/15 53.887 kg (118 lb 12.8 oz)  03/05/15 52.164 kg (115 lb)  01/31/15 53.524 kg (118 lb)      ASSESSMENT AND PLAN:  # Shortness of breath: The etiology of DianeRocha's shortness of breath is unclear. She does not appear to be in heart failure on exam. She had a negative stress test, however it was over a year ago. She is not anemic and her pulmonary function testing was normal. There is no evidence of blood clot, based on a negative d-dimer. We will obtain an echo to ensure that there is no underlying structural heart disease or diastolic heart failure that could account for her symptoms.  If this is unremarkable we will repeat her exercise Myoview.  # Syncope: Diane [name] is not orthostatic on exam today. Her blood pressure is, however quite low. She notes that she had been eating and drinking as much lately the changes in her to . She's also lost 20 pounds I'm wondering if she is not intravascularly volume depleted, which could be contributing to her is additional lightheadedness and dizziness. Have  recommended that she use to compression hose and that she increase her fluid intake to at least 8-10 glasses of fluids daily. I've also encouraged her to increase her intake of salty foods, as this will also help raise her blood pressure. He is a 24-hour Holter monitor to associate her heart rhythm at the time when she is having episodes of presyncope..   Current medicines are reviewed at length with the patient today.  The patient does not have concerns regarding medicines.  The following changes have been made:  no change  Labs/ tests ordered today include:   Orders Placed This Encounter  Procedures  . Holter monitor - 24 hour  . EKG 12-Lead  . ECHOCARDIOGRAM COMPLETE    Disposition:   FU with Inmer Nix C. Duke Salvia, MD in 1 month    Signed, Madilyn Hook, MD  05/29/2015 1:21  PM    [facility]

## 2015-06-04 ENCOUNTER — Ambulatory Visit (HOSPITAL_COMMUNITY): Payer: 59 | Attending: Internal Medicine

## 2015-06-04 ENCOUNTER — Other Ambulatory Visit: Payer: Self-pay

## 2015-06-04 DIAGNOSIS — I5189 Other ill-defined heart diseases: Secondary | ICD-10-CM | POA: Insufficient documentation

## 2015-06-04 DIAGNOSIS — R0602 Shortness of breath: Secondary | ICD-10-CM

## 2015-06-04 DIAGNOSIS — R55 Syncope and collapse: Secondary | ICD-10-CM | POA: Diagnosis not present

## 2015-06-04 DIAGNOSIS — I517 Cardiomegaly: Secondary | ICD-10-CM | POA: Insufficient documentation

## 2015-06-04 DIAGNOSIS — I071 Rheumatic tricuspid insufficiency: Secondary | ICD-10-CM | POA: Diagnosis not present

## 2015-06-04 DIAGNOSIS — R0600 Dyspnea, unspecified: Secondary | ICD-10-CM | POA: Diagnosis present

## 2015-06-04 DIAGNOSIS — I340 Nonrheumatic mitral (valve) insufficiency: Secondary | ICD-10-CM | POA: Diagnosis not present

## 2015-06-04 DIAGNOSIS — I313 Pericardial effusion (noninflammatory): Secondary | ICD-10-CM | POA: Diagnosis not present

## 2015-06-10 ENCOUNTER — Telehealth: Payer: Self-pay | Admitting: *Deleted

## 2015-06-10 NOTE — Telephone Encounter (Signed)
-----   Message from Chilton Si, MD sent at 06/09/2015  3:01 PM EST ----- Echo showed severe leaking of the mitral valve.  Please set up a follow up appointment to discuss the next steps.

## 2015-06-10 NOTE — Telephone Encounter (Signed)
Spoke to patient. Result given . Verbalized understanding Patient request to be put on waitlist for an early appointment. Scheduler aware

## 2015-06-10 NOTE — Telephone Encounter (Signed)
-----   Message from Chilton Si, MD sent at 06/10/2015  1:27 PM EST ----- Monitor shows very occasional extra beats from the top and bottom chambers of the heart.  No dangerous rhythms.

## 2015-07-03 ENCOUNTER — Encounter: Payer: Self-pay | Admitting: Cardiovascular Disease

## 2015-07-03 ENCOUNTER — Encounter: Payer: Self-pay | Admitting: Cardiology

## 2015-07-03 ENCOUNTER — Ambulatory Visit (INDEPENDENT_AMBULATORY_CARE_PROVIDER_SITE_OTHER): Payer: 59 | Admitting: Cardiovascular Disease

## 2015-07-03 DIAGNOSIS — Z01818 Encounter for other preprocedural examination: Secondary | ICD-10-CM | POA: Diagnosis not present

## 2015-07-03 DIAGNOSIS — I340 Nonrheumatic mitral (valve) insufficiency: Secondary | ICD-10-CM

## 2015-07-03 DIAGNOSIS — I38 Endocarditis, valve unspecified: Secondary | ICD-10-CM

## 2015-07-03 DIAGNOSIS — D689 Coagulation defect, unspecified: Secondary | ICD-10-CM

## 2015-07-03 DIAGNOSIS — Z01810 Encounter for preprocedural cardiovascular examination: Secondary | ICD-10-CM

## 2015-07-03 DIAGNOSIS — I5042 Chronic combined systolic (congestive) and diastolic (congestive) heart failure: Secondary | ICD-10-CM

## 2015-07-03 HISTORY — DX: Chronic combined systolic (congestive) and diastolic (congestive) heart failure: I50.42

## 2015-07-03 HISTORY — DX: Nonrheumatic mitral (valve) insufficiency: I34.0

## 2015-07-03 LAB — CBC
HEMATOCRIT: 39.8 % (ref 36.0–46.0)
HEMOGLOBIN: 13.3 g/dL (ref 12.0–15.0)
MCH: 31.6 pg (ref 26.0–34.0)
MCHC: 33.4 g/dL (ref 30.0–36.0)
MCV: 94.5 fL (ref 78.0–100.0)
MPV: 10.5 fL (ref 8.6–12.4)
Platelets: 241 10*3/uL (ref 150–400)
RBC: 4.21 MIL/uL (ref 3.87–5.11)
RDW: 12.8 % (ref 11.5–15.5)
WBC: 7.7 10*3/uL (ref 4.0–10.5)

## 2015-07-03 LAB — BASIC METABOLIC PANEL
BUN: 9 mg/dL (ref 7–25)
CHLORIDE: 100 mmol/L (ref 98–110)
CO2: 28 mmol/L (ref 20–31)
Calcium: 9.4 mg/dL (ref 8.6–10.4)
Creat: 0.61 mg/dL (ref 0.50–1.05)
Glucose, Bld: 77 mg/dL (ref 65–99)
POTASSIUM: 4 mmol/L (ref 3.5–5.3)
SODIUM: 135 mmol/L (ref 135–146)

## 2015-07-03 NOTE — Patient Instructions (Signed)
You have been referred to Dr Barry Dienes - ( mitral valve) AFTER TEST ARE COMPLETED.  LABS- CBC, PT,PTT, BMP  NO CHEST XRAY IS NEEDED.  Your physician has requested that you have a RIGHT AND LEFT HEART cardiac catheterization. Cardiac catheterization is used to diagnose and/or treat various heart conditions. Doctors may recommend this procedure for a number of different reasons. The most common reason is to evaluate chest pain. Chest pain can be a symptom of coronary artery disease (CAD), and cardiac catheterization can show whether plaque is narrowing or blocking your heart's arteries. This procedure is also used to evaluate the valves, as well as measure the blood flow and oxygen levels in different parts of your heart. For further information please visit [email or web address] Please follow instruction sheet, as given.   Your physician has requested that you have a TEE will PERFORMED AT [facility]. During a TEE, sound waves are used to create images of your heart. It provides your doctor with information about the size and shape of your heart and how well your heart's chambers and valves are working. In this t est, a transducer is attached to the end of a flexible tube that's guided down your throat and into your esophagus (the tube leading from you mouth to your stomach) to get a more detailed image of your heart. You are not awake for the procedure. Please see the instruction sheet given to you today. For further information please visit [email or web address]  Your physician wants you to follow-up in 4 months with Dr Duke Salvia . You will receive a reminder letter in the mail two months in advance. If you don't receive a letter, please call our office to schedule the follow-up appointment.

## 2015-07-03 NOTE — Progress Notes (Signed)
Cardiology Office Note   Date:  07/06/2015   ID:  Diane [name], DOB Dec 05, 1960, MRN [id number]  PCP:  Emeterio Reeve, MD  Cardiologist:   Madilyn Hook, MD   Chief Complaint  Patient presents with  . Follow-up    1 mo post ECHO/holter//pt c/o chest heavyness, constant SOB and lightheadedness/ throat feels dry, hard to swallow  . Cough    random. 4 years/tried allergy med, did not work  . Loss of Consciousness    last friday, children found her, not sure how long she was out. Pt states she thinks she was trying to sit down and she passed out. feels like she is going to faint often, but feels better after sitting for a while.     [name] is a 54 y.o. female who presents for an evaluation of syncope.     Interval History 12/15.16: Diane [name] underwent echocardiography that revealed severe mitral regurgitation and a reduced LVEF (45-50%).  The MR was due to tethering of the posterior leaflet.  She was also noted to have grade 3 diastolic dysfunction with mid basal to mid inferolateral wall hypokinesis.  Diane [name] continues to feel poorly.  She has constant coughing that makes it hard for her to breathe.  She also notes lower extremity edema.  She has been limiting her work because she can no longer stand all day.  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  She is unable to do a full day's work without extreme fatigue.  She also has constant chest discomfort.  Diane [name] lives alone but states that her daughter will come help her after surgery.   History of Present Illness 05/29/15: Diane [name] reports that her symptoms began approximately 1-1/2 years ago. In that time. She's had 2 episodes of syncope lasting up to 15 minutes. She's had lightheadedness and dizziness upon standing and lasts may have the most severe episode that resulted in syncope. She was evaluated in the emergency department, at which time her blood pressure and heart  rate were normal.  Cardiac enzymes were negative and her EKG showed borderline QT prolongation, but was otherwise unremarkable.  D-dimer was negative. She was referred to follow-up with her primary care physician as well as cardiology. In the interim she also saw a neurologist, who did an MRI of the brain that was unremarkable.  She had an EEG to evaluate this event as well as recurrent episodes of right facial droop.  EEG was unremarkable.  Diane [name] has noted shortness of breath and lightheadedness when walking up an incline.  She has to sit down in order to avoid passing out.  These episodes are associated with chest tightness.  The episodes last for a few minutes and improve with rest.  The tightness is 5-6/10 in severity.  It is sometimes associated with nausea but no diaphoresis. She has been unable to exercise due to these symptoms.  She had an exercise Myoview in January 2015 that was negative for ischemia.  She denies palpitations, lower extremity edema, orthopnea or PND.  Diane [name] is concerned because a friend had similar symptoms and was ultimately diagnosed with valvular heart disease requiring surgical intervention.  Diane [name] also notes that her taste buds have changed. She no longer like the taste of salty or sweet things. She's also had a cough for 4 years that is nonproductive and not associated with fever or chills.   Past Medical History  Diagnosis Date  . Shortness of breath 05/29/2015  . Severe mitral regurgitation 07/03/2015  . Chronic combined systolic and diastolic CHF (congestive heart failure) (HCC) 07/03/2015    Grade 3 diastolic dysfunction.  LVEF 40-45% 05/2015.    Past Surgical History  Procedure Laterality Date  . Ovarian cyst removal       Current Outpatient Prescriptions  Medication Sig Dispense Refill  . naproxen sodium (ANAPROX) 220 MG tablet Take 220 mg by mouth 2 (two) times daily with a meal.    . psyllium (REGULOID) 0.52 G capsule Take 0.52 g  by mouth daily.    Marland Kitchen saccharomyces boulardii (FLORASTOR) 250 MG capsule Take 250 mg by mouth 2 (two) times daily.     No current facility-administered medications for this visit.    Allergies:   Vicodin    Social History:  The patient  reports that she has never smoked. She has never used smokeless tobacco. She reports that she does not drink alcohol or use illicit drugs.   Family History:  The patient's family history is not on file.    ROS:  Please see the history of present illness.   Otherwise, review of systems are positive for none.   All other systems are reviewed and negative.    PHYSICAL EXAM: VS:  BP 110/76 mmHg  Pulse 87  Ht  (1.575 m)  Wt 52.799 kg (116 lb 6.4 oz)  BMI 21.28 kg/m2  SpO2 99% , BMI Body mass index is 21.28 kg/(m^2). GENERAL:  Well appearing HEENT:  Pupils equal round and reactive, fundi not visualized, oral mucosa unremarkable NECK:  No jugular venous distention, waveform within normal limits, carotid upstroke brisk and symmetric, no bruits, no thyromegaly LYMPHATICS:  No cervical adenopathy LUNGS:  Clear to auscultation bilaterally HEART:  RRR.  PMI not displaced or sustained,S1 and S2 within normal limits, no S3, no S4, no clicks, no rubs, no murmurs ABD:  Flat, positive bowel sounds normal in frequency in pitch, no bruits, no rebound, no guarding, no midline pulsatile mass, no hepatomegaly, no splenomegaly EXT:  2 plus pulses throughout, no edema, no cyanosis no clubbing SKIN:  No rashes no nodules NEURO:  Cranial nerves II through XII grossly intact, motor grossly intact throughout PSYCH:  Cognitively intact, oriented to person place and time    EKG:  EKG is not ordered today.  Echo 06/04/15: Study Conclusions  - Left ventricle: The cavity size was normal. Wall thickness was increased in a pattern of mild LVH. Systolic function was mildly reduced. The estimated ejection fraction was in the range of 45% to 50%. Abnormal GLPSS at  -11%, with inferior and inferolateral strain abnormality. Basal to mid inferolateral wall hypokinesis. Doppler parameters are consistent with restrictive left ventricular relaxation (grade 3 diastolic dysfunction). The E/A ratio is >2.5. The E/e&' ratio is >20, suggesting markedly elevated LV filling pressure. - Mitral valve: Mildly thickened leaflets with tethering of the posterior leaflet. There is severe, posteriorly directed regurgitation which hugs the atrial free wall. Reversal of flow is seen in the pulmonary vein. - Left atrium: Severely dilated at 52 ml/m2. - Right atrium: The atrium was mildly dilated. - Tricuspid valve: There was mild regurgitation. - Pulmonary arteries: PA peak pressure: 41 mm Hg (S). - Inferior vena cava: The vessel was normal in size. The respirophasic diameter changes were in the normal range (>= 50%), consistent with normal central venous pressure. - Pericardium, extracardiac: A trivial pericardial effusion was identified posterior to the heart. Features  were not consistent with tamponade physiology.  Impressions:  - LVEF 45-50%, mild LVH, abnormal GLPSS at -11% with inferolateral strain and wall motion abnormalities. There is tethering of the posterior mitral leaflet and severe posteriorly directed mitral regurgitation. Restrictive diastolic dysfunction with likely elevated LV filling pressures. Severely dilated LA at 52 ml/m2, mild TR, RVSP elevated at 41 mmHg (consistent with mild pulmonary venous hypertension), trivial posterior pericardial effusion without tamponade physiology.  PFT 11/05/14:  FEV1  1.9 L FVC : 2.4 L FEV1 and FVC are reduced, but the FEV1/FVC ratio is normal.  Following adminitration of bronchodilator there was no significant response.  Consistent with minimal obstructive airway disease.    Recent Labs: 11/21/2014: B Natriuretic Peptide 196.6* 07/03/2015: BUN 9; Creat 0.61; Hemoglobin  13.3; Platelets 241; Potassium 4.0; Sodium 135    Lipid Panel No results found for: CHOL, TRIG, HDL, CHOLHDL, VLDL, LDLCALC, LDLDIRECT    Wt Readings from Last 3 Encounters:  07/03/15 52.799 kg (116 lb 6.4 oz)  05/29/15 53.887 kg (118 lb 12.8 oz)  03/05/15 52.164 kg (115 lb)      ASSESSMENT AND PLAN:  # Severe MR:  Diane [name] has severe mitral regurgitation caused by tethering of the posterior leaflet. We will obtain a transesophageal echo to further evaluate her mitral regurgitation.  We will also obtain a left heart catheterization to both evaluate her chest pain as well as to rule out ischemia as the cause of her wall motion abnormality and leaflet tethering.  She will need a RHC as well for pre-surgical planning.  After these tests we will refer her to Dr. Cornelius Moras for surgical consultation.  She does not appear to be volume overloaded on exam.    # Chronic systolic and diastolic heart failure: LVEF was mildly depressed on echo.  She has no physical exam findings consistent with heart failure.  Given that her BP is low and she initially presented with syncope, we will not start an ACE-I/ARB or beta blocker att this time.  # Chest pain:  Symptoms are atypical, but she has inferolateral wall hypokinesis on echo and mitral valve leaflet tethering. She will undergo LHC as above.   Current medicines are reviewed at length with the patient today.  The patient does not have concerns regarding medicines.  The following changes have been made:  no change  Labs/ tests ordered today include:   Orders Placed This Encounter  Procedures  . CBC  . APTT  . Protime-INR  . Basic metabolic panel  . Ambulatory referral to Cardiothoracic Surgery  . ECHO TEE  . LEFT AND RIGHT HEART CATHETERIZATION WITH CORONARY ANGIOGRAM    Disposition:   FU with Diane Preston C. Duke Salvia, MD in 4 months   Signed, Madilyn Hook, MD  07/06/2015 11:35 AM    [facility]

## 2015-07-04 ENCOUNTER — Other Ambulatory Visit: Payer: Self-pay | Admitting: *Deleted

## 2015-07-04 ENCOUNTER — Telehealth: Payer: Self-pay | Admitting: *Deleted

## 2015-07-04 DIAGNOSIS — I340 Nonrheumatic mitral (valve) insufficiency: Secondary | ICD-10-CM

## 2015-07-04 DIAGNOSIS — Z01818 Encounter for other preprocedural examination: Secondary | ICD-10-CM

## 2015-07-04 LAB — PROTIME-INR
INR: 0.97 (ref <1.50)
PROTHROMBIN TIME: 13 s (ref 11.6–15.2)

## 2015-07-04 LAB — APTT: aPTT: 29 seconds (ref 24–37)

## 2015-07-04 NOTE — Telephone Encounter (Signed)
-----   Message from Chilton Si, MD sent at 07/04/2015  7:55 AM EST ----- Normal labs

## 2015-07-04 NOTE — Telephone Encounter (Signed)
Spoke to patient. labs Result given . Verbalized understanding  

## 2015-07-06 ENCOUNTER — Encounter: Payer: Self-pay | Admitting: Cardiovascular Disease

## 2015-07-07 ENCOUNTER — Encounter (HOSPITAL_COMMUNITY): Payer: Self-pay

## 2015-07-07 ENCOUNTER — Ambulatory Visit (HOSPITAL_COMMUNITY)
Admission: RE | Admit: 2015-07-07 | Discharge: 2015-07-07 | Disposition: A | Discharge status: Home / Self Care | Payer: 59 | Source: Ambulatory Visit | Attending: Cardiovascular Disease | Admitting: Cardiovascular Disease

## 2015-07-07 ENCOUNTER — Ambulatory Visit (HOSPITAL_BASED_OUTPATIENT_CLINIC_OR_DEPARTMENT_OTHER): Payer: 59

## 2015-07-07 ENCOUNTER — Encounter (HOSPITAL_COMMUNITY)
Admission: RE | Disposition: A | Discharge status: Home / Self Care | Payer: Self-pay | Source: Ambulatory Visit | Attending: Cardiovascular Disease

## 2015-07-07 DIAGNOSIS — I340 Nonrheumatic mitral (valve) insufficiency: Secondary | ICD-10-CM

## 2015-07-07 DIAGNOSIS — I5042 Chronic combined systolic (congestive) and diastolic (congestive) heart failure: Secondary | ICD-10-CM | POA: Insufficient documentation

## 2015-07-07 DIAGNOSIS — Z01818 Encounter for other preprocedural examination: Secondary | ICD-10-CM

## 2015-07-07 DIAGNOSIS — R0789 Other chest pain: Secondary | ICD-10-CM | POA: Insufficient documentation

## 2015-07-07 HISTORY — PX: TEE WITHOUT CARDIOVERSION: SHX5443

## 2015-07-07 SURGERY — ECHOCARDIOGRAM, TRANSESOPHAGEAL
Anesthesia: Moderate Sedation

## 2015-07-07 MED ORDER — DIPHENHYDRAMINE HCL 50 MG/ML IJ SOLN
INTRAMUSCULAR | Status: AC
  Filled 2015-07-07: qty 1

## 2015-07-07 MED ORDER — MIDAZOLAM HCL 5 MG/ML IJ SOLN
INTRAMUSCULAR | Status: AC
  Filled 2015-07-07: qty 2

## 2015-07-07 MED ORDER — LIDOCAINE VISCOUS 2 % MT SOLN
OROMUCOSAL | Status: DC | PRN
  Administered 2015-07-07: 1 via OROMUCOSAL

## 2015-07-07 MED ORDER — SODIUM CHLORIDE 0.9 % IV SOLN
INTRAVENOUS | Status: DC
  Administered 2015-07-07: 1000 mL via INTRAVENOUS

## 2015-07-07 MED ORDER — MIDAZOLAM HCL 10 MG/2ML IJ SOLN
INTRAMUSCULAR | Status: DC | PRN
  Administered 2015-07-07: 3 mg via INTRAVENOUS

## 2015-07-07 MED ORDER — FENTANYL CITRATE (PF) 100 MCG/2ML IJ SOLN
INTRAMUSCULAR | Status: AC
  Filled 2015-07-07: qty 2

## 2015-07-07 MED ORDER — FENTANYL CITRATE (PF) 100 MCG/2ML IJ SOLN
INTRAMUSCULAR | Status: DC | PRN
  Administered 2015-07-07: 25 ug via INTRAVENOUS

## 2015-07-07 MED ORDER — LIDOCAINE VISCOUS 2 % MT SOLN
OROMUCOSAL | Status: AC
  Filled 2015-07-07: qty 15

## 2015-07-07 NOTE — H&P (View-Only) (Signed)
Cardiology Office Note   Date:  07/06/2015   ID:  Diane [name], DOB Dec 05, 1960, MRN [id number]  PCP:  Emeterio Reeve, MD  Cardiologist:   Madilyn Hook, MD   Chief Complaint  Patient presents with  . Follow-up    1 mo post ECHO/holter//pt c/o chest heavyness, constant SOB and lightheadedness/ throat feels dry, hard to swallow  . Cough    random. 4 years/tried allergy med, did not work  . Loss of Consciousness    last friday, children found her, not sure how long she was out. Pt states she thinks she was trying to sit down and she passed out. feels like she is going to faint often, but feels better after sitting for a while.     [name] is a 54 y.o. female who presents for an evaluation of syncope.     Interval History 12/15.16: Diane [name] underwent echocardiography that revealed severe mitral regurgitation and a reduced LVEF (45-50%).  The MR was due to tethering of the posterior leaflet.  She was also noted to have grade 3 diastolic dysfunction with mid basal to mid inferolateral wall hypokinesis.  Diane [name] continues to feel poorly.  She has constant coughing that makes it hard for her to breathe.  She also notes lower extremity edema.  She has been limiting her work because she can no longer stand all day.  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  She is unable to do a full day's work without extreme fatigue.  She also has constant chest discomfort.  Diane [name] lives alone but states that her daughter will come help her after surgery.   History of Present Illness 05/29/15: Diane [name] reports that her symptoms began approximately 1-1/2 years ago. In that time. She's had 2 episodes of syncope lasting up to 15 minutes. She's had lightheadedness and dizziness upon standing and lasts may have the most severe episode that resulted in syncope. She was evaluated in the emergency department, at which time her blood pressure and heart  rate were normal.  Cardiac enzymes were negative and her EKG showed borderline QT prolongation, but was otherwise unremarkable.  D-dimer was negative. She was referred to follow-up with her primary care physician as well as cardiology. In the interim she also saw a neurologist, who did an MRI of the brain that was unremarkable.  She had an EEG to evaluate this event as well as recurrent episodes of right facial droop.  EEG was unremarkable.  Diane [name] has noted shortness of breath and lightheadedness when walking up an incline.  She has to sit down in order to avoid passing out.  These episodes are associated with chest tightness.  The episodes last for a few minutes and improve with rest.  The tightness is 5-6/10 in severity.  It is sometimes associated with nausea but no diaphoresis. She has been unable to exercise due to these symptoms.  She had an exercise Myoview in January 2015 that was negative for ischemia.  She denies palpitations, lower extremity edema, orthopnea or PND.  Diane [name] is concerned because a friend had similar symptoms and was ultimately diagnosed with valvular heart disease requiring surgical intervention.  Diane [name] also notes that her taste buds have changed. She no longer like the taste of salty or sweet things. She's also had a cough for 4 years that is nonproductive and not associated with fever or chills.   Past Medical History  Diagnosis Date  . Shortness of breath 05/29/2015  . Severe mitral regurgitation 07/03/2015  . Chronic combined systolic and diastolic CHF (congestive heart failure) (HCC) 07/03/2015    Grade 3 diastolic dysfunction.  LVEF 40-45% 05/2015.    Past Surgical History  Procedure Laterality Date  . Ovarian cyst removal       Current Outpatient Prescriptions  Medication Sig Dispense Refill  . naproxen sodium (ANAPROX) 220 MG tablet Take 220 mg by mouth 2 (two) times daily with a meal.    . psyllium (REGULOID) 0.52 G capsule Take 0.52 g  by mouth daily.    . saccharomyces boulardii (FLORASTOR) 250 MG capsule Take 250 mg by mouth 2 (two) times daily.     No current facility-administered medications for this visit.    Allergies:   Vicodin    Social History:  The patient  reports that she has never smoked. She has never used smokeless tobacco. She reports that she does not drink alcohol or use illicit drugs.   Family History:  The patient's family history is not on file.    ROS:  Please see the history of present illness.   Otherwise, review of systems are positive for none.   All other systems are reviewed and negative.    PHYSICAL EXAM: VS:  BP 110/76 mmHg  Pulse 87  Ht 5' 2" (1.575 m)  Wt 52.799 kg (116 lb 6.4 oz)  BMI 21.28 kg/m2  SpO2 99% , BMI Body mass index is 21.28 kg/(m^2). GENERAL:  Well appearing HEENT:  Pupils equal round and reactive, fundi not visualized, oral mucosa unremarkable NECK:  No jugular venous distention, waveform within normal limits, carotid upstroke brisk and symmetric, no bruits, no thyromegaly LYMPHATICS:  No cervical adenopathy LUNGS:  Clear to auscultation bilaterally HEART:  RRR.  PMI not displaced or sustained,S1 and S2 within normal limits, no S3, no S4, no clicks, no rubs, no murmurs ABD:  Flat, positive bowel sounds normal in frequency in pitch, no bruits, no rebound, no guarding, no midline pulsatile mass, no hepatomegaly, no splenomegaly EXT:  2 plus pulses throughout, no edema, no cyanosis no clubbing SKIN:  No rashes no nodules NEURO:  Cranial nerves II through XII grossly intact, motor grossly intact throughout PSYCH:  Cognitively intact, oriented to person place and time    EKG:  EKG is not ordered today.  Echo 06/04/15: Study Conclusions  - Left ventricle: The cavity size was normal. Wall thickness was increased in a pattern of mild LVH. Systolic function was mildly reduced. The estimated ejection fraction was in the range of 45% to 50%. Abnormal GLPSS at  -11%, with inferior and inferolateral strain abnormality. Basal to mid inferolateral wall hypokinesis. Doppler parameters are consistent with restrictive left ventricular relaxation (grade 3 diastolic dysfunction). The E/A ratio is >2.5. The E/e&' ratio is >20, suggesting markedly elevated LV filling pressure. - Mitral valve: Mildly thickened leaflets with tethering of the posterior leaflet. There is severe, posteriorly directed regurgitation which hugs the atrial free wall. Reversal of flow is seen in the pulmonary vein. - Left atrium: Severely dilated at 52 ml/m2. - Right atrium: The atrium was mildly dilated. - Tricuspid valve: There was mild regurgitation. - Pulmonary arteries: PA peak pressure: 41 mm Hg (S). - Inferior vena cava: The vessel was normal in size. The respirophasic diameter changes were in the normal range (>= 50%), consistent with normal central venous pressure. - Pericardium, extracardiac: A trivial pericardial effusion was identified posterior to the heart. Features   were not consistent with tamponade physiology.  Impressions:  - LVEF 45-50%, mild LVH, abnormal GLPSS at -11% with inferolateral strain and wall motion abnormalities. There is tethering of the posterior mitral leaflet and severe posteriorly directed mitral regurgitation. Restrictive diastolic dysfunction with likely elevated LV filling pressures. Severely dilated LA at 52 ml/m2, mild TR, RVSP elevated at 41 mmHg (consistent with mild pulmonary venous hypertension), trivial posterior pericardial effusion without tamponade physiology.  PFT 11/05/14:  FEV1  1.9 L FVC : 2.4 L FEV1 and FVC are reduced, but the FEV1/FVC ratio is normal.  Following adminitration of bronchodilator there was no significant response.  Consistent with minimal obstructive airway disease.    Recent Labs: 11/21/2014: B Natriuretic Peptide 196.6* 07/03/2015: BUN 9; Creat 0.61; Hemoglobin  13.3; Platelets 241; Potassium 4.0; Sodium 135    Lipid Panel No results found for: CHOL, TRIG, HDL, CHOLHDL, VLDL, LDLCALC, LDLDIRECT    Wt Readings from Last 3 Encounters:  07/03/15 52.799 kg (116 lb 6.4 oz)  05/29/15 53.887 kg (118 lb 12.8 oz)  03/05/15 52.164 kg (115 lb)      ASSESSMENT AND PLAN:  # Severe MR:  Diane [name] has severe mitral regurgitation caused by tethering of the posterior leaflet. We will obtain a transesophageal echo to further evaluate her mitral regurgitation.  We will also obtain a left heart catheterization to both evaluate her chest pain as well as to rule out ischemia as the cause of her wall motion abnormality and leaflet tethering.  She will need a RHC as well for pre-surgical planning.  After these tests we will refer her to Dr. Cornelius Moras for surgical consultation.  She does not appear to be volume overloaded on exam.    # Chronic systolic and diastolic heart failure: LVEF was mildly depressed on echo.  She has no physical exam findings consistent with heart failure.  Given that her BP is low and she initially presented with syncope, we will not start an ACE-I/ARB or beta blocker att this time.  # Chest pain:  Symptoms are atypical, but she has inferolateral wall hypokinesis on echo and mitral valve leaflet tethering. She will undergo LHC as above.   Current medicines are reviewed at length with the patient today.  The patient does not have concerns regarding medicines.  The following changes have been made:  no change  Labs/ tests ordered today include:   Orders Placed This Encounter  Procedures  . CBC  . APTT  . Protime-INR  . Basic metabolic panel  . Ambulatory referral to Cardiothoracic Surgery  . ECHO TEE  . LEFT AND RIGHT HEART CATHETERIZATION WITH CORONARY ANGIOGRAM    Disposition:   FU with Shuna Tabor C. Duke Salvia, MD in 4 months   Signed, Madilyn Hook, MD  07/06/2015 11:35 AM    [facility]

## 2015-07-07 NOTE — CV Procedure (Signed)
Brief TEE Note  LVEF >55% Moderate MR, mild TR, mild AR, trivial PR.  For additional details see full TEE report.  Karmin Kasprzak C. Duke Salvia, MD, [facility]  07/07/2015 1:23 PM

## 2015-07-07 NOTE — Discharge Instructions (Signed)

## 2015-07-07 NOTE — Progress Notes (Signed)
  Echocardiogram Echocardiogram Transesophageal has been performed.  Diane [name] 07/07/2015, 2:01 PM

## 2015-07-07 NOTE — Interval H&P Note (Signed)
History and Physical Interval Note:  07/07/2015 12:55 PM  Diane [name]  has presented today for surgery, with the diagnosis of mitro regurgitation  The various methods of treatment have been discussed with the patient and family. After consideration of risks, benefits and other options for treatment, the patient has consented to  Procedure(s): TRANSESOPHAGEAL ECHOCARDIOGRAM (TEE) (N/A) as a surgical intervention .  The patient's history has been reviewed, patient examined, no change in status, stable for surgery.  I have reviewed the patient's chart and labs.  Questions were answered to the patient's satisfaction.    Ragen Laver C. Duke Salvia, MD, [facility]  07/07/2015  12:55 PM

## 2015-07-08 ENCOUNTER — Encounter (HOSPITAL_COMMUNITY)
Admission: RE | Disposition: A | Discharge status: Home / Self Care | Payer: Self-pay | Source: Ambulatory Visit | Attending: Cardiology

## 2015-07-08 ENCOUNTER — Ambulatory Visit (HOSPITAL_COMMUNITY)
Admission: RE | Admit: 2015-07-08 | Discharge: 2015-07-08 | Disposition: A | Discharge status: Home / Self Care | Payer: 59 | Source: Ambulatory Visit | Attending: Cardiology | Admitting: Cardiology

## 2015-07-08 ENCOUNTER — Encounter (HOSPITAL_COMMUNITY): Payer: Self-pay | Admitting: Cardiovascular Disease

## 2015-07-08 DIAGNOSIS — Z01818 Encounter for other preprocedural examination: Secondary | ICD-10-CM

## 2015-07-08 DIAGNOSIS — I340 Nonrheumatic mitral (valve) insufficiency: Secondary | ICD-10-CM | POA: Diagnosis present

## 2015-07-08 DIAGNOSIS — I5042 Chronic combined systolic (congestive) and diastolic (congestive) heart failure: Secondary | ICD-10-CM | POA: Diagnosis present

## 2015-07-08 DIAGNOSIS — R0789 Other chest pain: Secondary | ICD-10-CM | POA: Diagnosis not present

## 2015-07-08 DIAGNOSIS — R0602 Shortness of breath: Secondary | ICD-10-CM | POA: Diagnosis present

## 2015-07-08 DIAGNOSIS — Z01810 Encounter for preprocedural cardiovascular examination: Secondary | ICD-10-CM | POA: Diagnosis not present

## 2015-07-08 HISTORY — PX: CARDIAC CATHETERIZATION: SHX172

## 2015-07-08 LAB — POCT I-STAT 3, ART BLOOD GAS (G3+)
ACID-BASE EXCESS: 3 mmol/L — AB (ref 0.0–2.0)
Bicarbonate: 28.1 mEq/L — ABNORMAL HIGH (ref 20.0–24.0)
O2 SAT: 95 %
TCO2: 29 mmol/L (ref 0–100)
pCO2 arterial: 44 mmHg (ref 35.0–45.0)
pH, Arterial: 7.413 (ref 7.350–7.450)
pO2, Arterial: 78 mmHg — ABNORMAL LOW (ref 80.0–100.0)

## 2015-07-08 LAB — POCT I-STAT 3, VENOUS BLOOD GAS (G3P V)
ACID-BASE EXCESS: 4 mmol/L — AB (ref 0.0–2.0)
BICARBONATE: 29.5 meq/L — AB (ref 20.0–24.0)
O2 Saturation: 63 %
TCO2: 31 mmol/L (ref 0–100)
pCO2, Ven: 49.4 mmHg (ref 45.0–50.0)
pH, Ven: 7.384 — ABNORMAL HIGH (ref 7.250–7.300)
pO2, Ven: 34 mmHg (ref 30.0–45.0)

## 2015-07-08 SURGERY — RIGHT/LEFT HEART CATH AND CORONARY ANGIOGRAPHY
Anesthesia: LOCAL

## 2015-07-08 MED ORDER — IOHEXOL 350 MG/ML SOLN
INTRAVENOUS | Status: DC | PRN
  Administered 2015-07-08: 55 mL via INTRA_ARTERIAL

## 2015-07-08 MED ORDER — SODIUM CHLORIDE 0.9 % IV SOLN
INTRAVENOUS | Status: DC
  Administered 2015-07-08: 11:00:00 via INTRAVENOUS
  Administered 2015-07-08 (×2): 250 mL via INTRAVENOUS

## 2015-07-08 MED ORDER — SODIUM CHLORIDE 0.9 % IJ SOLN: 3.0000 mL | Freq: Two times a day (BID) | INTRAMUSCULAR | Status: DC

## 2015-07-08 MED ORDER — FENTANYL CITRATE (PF) 100 MCG/2ML IJ SOLN
INTRAMUSCULAR | Status: DC | PRN
  Administered 2015-07-08: 25 ug via INTRAVENOUS

## 2015-07-08 MED ORDER — ASPIRIN 81 MG PO CHEW
CHEWABLE_TABLET | ORAL | Status: AC
  Administered 2015-07-08: 81 mg via ORAL
  Filled 2015-07-08: qty 1

## 2015-07-08 MED ORDER — VERAPAMIL HCL 2.5 MG/ML IV SOLN
INTRAVENOUS | Status: AC
  Filled 2015-07-08: qty 2

## 2015-07-08 MED ORDER — SODIUM CHLORIDE 0.9 % WEIGHT BASED INFUSION: 3.0000 mL/kg/h | INTRAVENOUS | Status: DC

## 2015-07-08 MED ORDER — HEPARIN (PORCINE) IN NACL 2-0.9 UNIT/ML-% IJ SOLN
INTRAMUSCULAR | Status: AC
  Filled 2015-07-08: qty 1500

## 2015-07-08 MED ORDER — FENTANYL CITRATE (PF) 100 MCG/2ML IJ SOLN
INTRAMUSCULAR | Status: AC
  Filled 2015-07-08: qty 2

## 2015-07-08 MED ORDER — ONDANSETRON HCL 4 MG/2ML IJ SOLN: 4.0000 mg | Freq: Four times a day (QID) | INTRAMUSCULAR | Status: DC | PRN

## 2015-07-08 MED ORDER — MIDAZOLAM HCL 2 MG/2ML IJ SOLN
INTRAMUSCULAR | Status: DC | PRN
  Administered 2015-07-08: 1 mg via INTRAVENOUS

## 2015-07-08 MED ORDER — NITROGLYCERIN 1 MG/10 ML FOR IR/CATH LAB
INTRA_ARTERIAL | Status: AC
  Filled 2015-07-08: qty 10

## 2015-07-08 MED ORDER — LIDOCAINE HCL (PF) 1 % IJ SOLN
INTRAMUSCULAR | Status: AC
  Filled 2015-07-08: qty 30

## 2015-07-08 MED ORDER — MIDAZOLAM HCL 2 MG/2ML IJ SOLN
INTRAMUSCULAR | Status: AC
  Filled 2015-07-08: qty 2

## 2015-07-08 MED ORDER — SODIUM CHLORIDE 0.9 % IJ SOLN: 3.0000 mL | INTRAMUSCULAR | Status: DC | PRN

## 2015-07-08 MED ORDER — HEPARIN SODIUM (PORCINE) 1000 UNIT/ML IJ SOLN
INTRAMUSCULAR | Status: AC
  Filled 2015-07-08: qty 1

## 2015-07-08 MED ORDER — ASPIRIN 81 MG PO CHEW
81.0000 mg | CHEWABLE_TABLET | ORAL | Status: AC
  Administered 2015-07-08: 81 mg via ORAL

## 2015-07-08 MED ORDER — HEPARIN (PORCINE) IN NACL 2-0.9 UNIT/ML-% IJ SOLN
INTRAMUSCULAR | Status: DC | PRN
  Administered 2015-07-08: 16:00:00

## 2015-07-08 MED ORDER — HEPARIN SODIUM (PORCINE) 1000 UNIT/ML IJ SOLN
INTRAMUSCULAR | Status: DC | PRN
  Administered 2015-07-08: 3000 [IU] via INTRAVENOUS

## 2015-07-08 MED ORDER — VERAPAMIL HCL 2.5 MG/ML IV SOLN
INTRA_ARTERIAL | Status: DC | PRN
  Administered 2015-07-08: 16:00:00 via INTRA_ARTERIAL

## 2015-07-08 MED ORDER — SODIUM CHLORIDE 0.9 % IV SOLN: 250.0000 mL | INTRAVENOUS | Status: DC | PRN

## 2015-07-08 SURGICAL SUPPLY — 13 items
CATH BALLN WEDGE 5F 110CM (CATHETERS) ×2 IMPLANT
CATH INFINITI 5FR ANG PIGTAIL (CATHETERS) ×2 IMPLANT
CATH OPTITORQUE TIG 4.0 5F (CATHETERS) ×2 IMPLANT
DEVICE RAD COMP TR BAND LRG (VASCULAR PRODUCTS) ×2 IMPLANT
GLIDESHEATH SLEND A-KIT 6F 22G (SHEATH) ×2 IMPLANT
GUIDEWIRE .025 260CM (WIRE) ×2 IMPLANT
KIT HEART LEFT (KITS) ×2 IMPLANT
PACK CARDIAC CATHETERIZATION (CUSTOM PROCEDURE TRAY) ×2 IMPLANT
SHEATH FAST CATH BRACH 5F 5CM (SHEATH) ×2 IMPLANT
SYR MEDRAD MARK V 150ML (SYRINGE) ×2 IMPLANT
TRANSDUCER W/STOPCOCK (MISCELLANEOUS) ×2 IMPLANT
TUBING CIL FLEX 10 FLL-RA (TUBING) ×2 IMPLANT
WIRE SAFE-T 1.5MM-J .035X260CM (WIRE) ×2 IMPLANT

## 2015-07-08 NOTE — Discharge Instructions (Signed)
Radial Site Care °Refer to this sheet in the next few weeks. These instructions provide you with information about caring for yourself after your procedure. Your health care provider may also give you more specific instructions. Your treatment has been planned according to current medical practices, but problems sometimes occur. Call your health care provider if you have any problems or questions after your procedure. °WHAT TO EXPECT AFTER THE PROCEDURE °After your procedure, it is typical to have the following: °· Bruising at the radial site that usually fades within 1-2 weeks. °· Blood collecting in the tissue (hematoma) that may be painful to the touch. It should usually decrease in size and tenderness within 1-2 weeks. °HOME CARE INSTRUCTIONS °· Take medicines only as directed by your health care provider. °· You may shower 24-48 hours after the procedure or as directed by your health care provider. Remove the bandage (dressing) and gently wash the site with plain soap and water. Pat the area dry with a clean towel. Do not rub the site, because this may cause bleeding. °· Do not take baths, swim, or use a hot tub until your health care provider approves. °· Check your insertion site every day for redness, swelling, or drainage. °· Do not apply powder or lotion to the site. °· Do not flex or bend the affected arm for 24 hours or as directed by your health care provider. °· Do not push or pull heavy objects with the affected arm for 24 hours or as directed by your health care provider. °· Do not lift over 10 lb (4.5 kg) for 5 days after your procedure or as directed by your health care provider. °· Ask your health care provider when it is okay to: °¨ Return to work or school. °¨ Resume usual physical activities or sports. °¨ Resume sexual activity. °· Do not drive home if you are discharged the same day as the procedure. Have someone else drive you. °· You may drive 24 hours after the procedure unless otherwise  instructed by your health care provider. °· Do not operate machinery or power tools for 24 hours after the procedure. °· If your procedure was done as an outpatient procedure, which means that you went home the same day as your procedure, a responsible adult should be with you for the first 24 hours after you arrive home. °· Keep all follow-up visits as directed by your health care provider. This is important. °SEEK MEDICAL CARE IF: °· You have a fever. °· You have chills. °· You have increased bleeding from the radial site. Hold pressure on the site. °SEEK IMMEDIATE MEDICAL CARE IF: °· You have unusual pain at the radial site. °· You have redness, warmth, or swelling at the radial site. °· You have drainage (other than a small amount of blood on the dressing) from the radial site. °· The radial site is bleeding, and the bleeding does not stop after 30 minutes of holding steady pressure on the site. °· Your arm or hand becomes pale, cool, tingly, or numb. °  °This information is not intended to replace advice given to you by your health care provider. Make sure you discuss any questions you have with your health care provider. °  °Document Released: 08/07/2010 Document Revised: 07/26/2014 Document Reviewed: 01/21/2014 °Elsevier Interactive Patient Education ©2016 [employer] ° °

## 2015-07-08 NOTE — Interval H&P Note (Signed)
History and Physical Interval Note:  07/08/2015 3:31 PM  Diane [name]  has presented today for surgery, with the diagnosis of severe mitrial regurgitation. Pre-operative CV Evauation.    The various methods of treatment have been discussed with the patient and family. After consideration of risks, benefits and other options for treatment, the patient has consented to  Procedure(s): Right/Left Heart Cath and Coronary Angiography (N/A) as a surgical intervention .  The patient's history has been reviewed, patient examined, no change in status, stable for surgery.  I have reviewed the patient's chart and labs.  Questions were answered to the patient's satisfaction.     Keelia Graybill W

## 2015-07-08 NOTE — H&P (View-Only) (Signed)
Cardiology Office Note   Date:  07/06/2015   ID:  Diane [name], DOB Dec 05, 1960, MRN [id number]  PCP:  Emeterio Reeve, MD  Cardiologist:   Madilyn Hook, MD   Chief Complaint  Patient presents with  . Follow-up    1 mo post ECHO/holter//pt c/o chest heavyness, constant SOB and lightheadedness/ throat feels dry, hard to swallow  . Cough    random. 4 years/tried allergy med, did not work  . Loss of Consciousness    last friday, children found her, not sure how long she was out. Pt states she thinks she was trying to sit down and she passed out. feels like she is going to faint often, but feels better after sitting for a while.     [name] is a 54 y.o. female who presents for an evaluation of syncope.     Interval History 12/15.16: Diane [name] underwent echocardiography that revealed severe mitral regurgitation and a reduced LVEF (45-50%).  The MR was due to tethering of the posterior leaflet.  She was also noted to have grade 3 diastolic dysfunction with mid basal to mid inferolateral wall hypokinesis.  Diane [name] continues to feel poorly.  She has constant coughing that makes it hard for her to breathe.  She also notes lower extremity edema.  She has been limiting her work because she can no longer stand all day.  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  She is unable to do a full day's work without extreme fatigue.  She also has constant chest discomfort.  Diane [name] lives alone but states that her daughter will come help her after surgery.   History of Present Illness 05/29/15: Diane [name] reports that her symptoms began approximately 1-1/2 years ago. In that time. She's had 2 episodes of syncope lasting up to 15 minutes. She's had lightheadedness and dizziness upon standing and lasts may have the most severe episode that resulted in syncope. She was evaluated in the emergency department, at which time her blood pressure and heart  rate were normal.  Cardiac enzymes were negative and her EKG showed borderline QT prolongation, but was otherwise unremarkable.  D-dimer was negative. She was referred to follow-up with her primary care physician as well as cardiology. In the interim she also saw a neurologist, who did an MRI of the brain that was unremarkable.  She had an EEG to evaluate this event as well as recurrent episodes of right facial droop.  EEG was unremarkable.  Diane [name] has noted shortness of breath and lightheadedness when walking up an incline.  She has to sit down in order to avoid passing out.  These episodes are associated with chest tightness.  The episodes last for a few minutes and improve with rest.  The tightness is 5-6/10 in severity.  It is sometimes associated with nausea but no diaphoresis. She has been unable to exercise due to these symptoms.  She had an exercise Myoview in January 2015 that was negative for ischemia.  She denies palpitations, lower extremity edema, orthopnea or PND.  Diane [name] is concerned because a friend had similar symptoms and was ultimately diagnosed with valvular heart disease requiring surgical intervention.  Diane [name] also notes that her taste buds have changed. She no longer like the taste of salty or sweet things. She's also had a cough for 4 years that is nonproductive and not associated with fever or chills.   Past Medical History  Diagnosis Date  . Shortness of breath 05/29/2015  . Severe mitral regurgitation 07/03/2015  . Chronic combined systolic and diastolic CHF (congestive heart failure) (HCC) 07/03/2015    Grade 3 diastolic dysfunction.  LVEF 40-45% 05/2015.    Past Surgical History  Procedure Laterality Date  . Ovarian cyst removal       Current Outpatient Prescriptions  Medication Sig Dispense Refill  . naproxen sodium (ANAPROX) 220 MG tablet Take 220 mg by mouth 2 (two) times daily with a meal.    . psyllium (REGULOID) 0.52 G capsule Take 0.52 g  by mouth daily.    . saccharomyces boulardii (FLORASTOR) 250 MG capsule Take 250 mg by mouth 2 (two) times daily.     No current facility-administered medications for this visit.    Allergies:   Vicodin    Social History:  The patient  reports that she has never smoked. She has never used smokeless tobacco. She reports that she does not drink alcohol or use illicit drugs.   Family History:  The patient's family history is not on file.    ROS:  Please see the history of present illness.   Otherwise, review of systems are positive for none.   All other systems are reviewed and negative.    PHYSICAL EXAM: VS:  BP 110/76 mmHg  Pulse 87  Ht 5' 2" (1.575 m)  Wt 52.799 kg (116 lb 6.4 oz)  BMI 21.28 kg/m2  SpO2 99% , BMI Body mass index is 21.28 kg/(m^2). GENERAL:  Well appearing HEENT:  Pupils equal round and reactive, fundi not visualized, oral mucosa unremarkable NECK:  No jugular venous distention, waveform within normal limits, carotid upstroke brisk and symmetric, no bruits, no thyromegaly LYMPHATICS:  No cervical adenopathy LUNGS:  Clear to auscultation bilaterally HEART:  RRR.  PMI not displaced or sustained,S1 and S2 within normal limits, no S3, no S4, no clicks, no rubs, no murmurs ABD:  Flat, positive bowel sounds normal in frequency in pitch, no bruits, no rebound, no guarding, no midline pulsatile mass, no hepatomegaly, no splenomegaly EXT:  2 plus pulses throughout, no edema, no cyanosis no clubbing SKIN:  No rashes no nodules NEURO:  Cranial nerves II through XII grossly intact, motor grossly intact throughout PSYCH:  Cognitively intact, oriented to person place and time    EKG:  EKG is not ordered today.  Echo 06/04/15: Study Conclusions  - Left ventricle: The cavity size was normal. Wall thickness was increased in a pattern of mild LVH. Systolic function was mildly reduced. The estimated ejection fraction was in the range of 45% to 50%. Abnormal GLPSS at  -11%, with inferior and inferolateral strain abnormality. Basal to mid inferolateral wall hypokinesis. Doppler parameters are consistent with restrictive left ventricular relaxation (grade 3 diastolic dysfunction). The E/A ratio is >2.5. The E/e&' ratio is >20, suggesting markedly elevated LV filling pressure. - Mitral valve: Mildly thickened leaflets with tethering of the posterior leaflet. There is severe, posteriorly directed regurgitation which hugs the atrial free wall. Reversal of flow is seen in the pulmonary vein. - Left atrium: Severely dilated at 52 ml/m2. - Right atrium: The atrium was mildly dilated. - Tricuspid valve: There was mild regurgitation. - Pulmonary arteries: PA peak pressure: 41 mm Hg (S). - Inferior vena cava: The vessel was normal in size. The respirophasic diameter changes were in the normal range (>= 50%), consistent with normal central venous pressure. - Pericardium, extracardiac: A trivial pericardial effusion was identified posterior to the heart. Features   were not consistent with tamponade physiology.  Impressions:  - LVEF 45-50%, mild LVH, abnormal GLPSS at -11% with inferolateral strain and wall motion abnormalities. There is tethering of the posterior mitral leaflet and severe posteriorly directed mitral regurgitation. Restrictive diastolic dysfunction with likely elevated LV filling pressures. Severely dilated LA at 52 ml/m2, mild TR, RVSP elevated at 41 mmHg (consistent with mild pulmonary venous hypertension), trivial posterior pericardial effusion without tamponade physiology.  PFT 11/05/14:  FEV1  1.9 L FVC : 2.4 L FEV1 and FVC are reduced, but the FEV1/FVC ratio is normal.  Following adminitration of bronchodilator there was no significant response.  Consistent with minimal obstructive airway disease.    Recent Labs: 11/21/2014: B Natriuretic Peptide 196.6* 07/03/2015: BUN 9; Creat 0.61; Hemoglobin  13.3; Platelets 241; Potassium 4.0; Sodium 135    Lipid Panel No results found for: CHOL, TRIG, HDL, CHOLHDL, VLDL, LDLCALC, LDLDIRECT    Wt Readings from Last 3 Encounters:  07/03/15 52.799 kg (116 lb 6.4 oz)  05/29/15 53.887 kg (118 lb 12.8 oz)  03/05/15 52.164 kg (115 lb)      ASSESSMENT AND PLAN:  # Severe MR:  Ms. Rey has severe mitral regurgitation caused by tethering of the posterior leaflet. We will obtain a transesophageal echo to further evaluate her mitral regurgitation.  We will also obtain a left heart catheterization to both evaluate her chest pain as well as to rule out ischemia as the cause of her wall motion abnormality and leaflet tethering.  She will need a RHC as well for pre-surgical planning.  After these tests we will refer her to Dr. Cornelius Moras for surgical consultation.  She does not appear to be volume overloaded on exam.    # Chronic systolic and diastolic heart failure: LVEF was mildly depressed on echo.  She has no physical exam findings consistent with heart failure.  Given that her BP is low and she initially presented with syncope, we will not start an ACE-I/ARB or beta blocker att this time.  # Chest pain:  Symptoms are atypical, but she has inferolateral wall hypokinesis on echo and mitral valve leaflet tethering. She will undergo LHC as above.   Current medicines are reviewed at length with the patient today.  The patient does not have concerns regarding medicines.  The following changes have been made:  no change  Labs/ tests ordered today include:   Orders Placed This Encounter  Procedures  . CBC  . APTT  . Protime-INR  . Basic metabolic panel  . Ambulatory referral to Cardiothoracic Surgery  . ECHO TEE  . LEFT AND RIGHT HEART CATHETERIZATION WITH CORONARY ANGIOGRAM    Disposition:   FU with Jamesetta Greenhalgh C. Duke Salvia, MD in 4 months   Signed, Madilyn Hook, MD  07/06/2015 11:35 AM    [facility]

## 2015-07-09 ENCOUNTER — Encounter (HOSPITAL_COMMUNITY): Payer: Self-pay | Admitting: Cardiology

## 2015-07-10 ENCOUNTER — Encounter: Payer: Self-pay | Admitting: Thoracic Surgery (Cardiothoracic Vascular Surgery)

## 2015-07-10 ENCOUNTER — Institutional Professional Consult (permissible substitution) (INDEPENDENT_AMBULATORY_CARE_PROVIDER_SITE_OTHER): Payer: 59 | Admitting: Thoracic Surgery (Cardiothoracic Vascular Surgery)

## 2015-07-10 ENCOUNTER — Other Ambulatory Visit: Payer: Self-pay | Admitting: *Deleted

## 2015-07-10 VITALS — BP 101/64 | HR 90 | Resp 20 | Ht 62.0 in | Wt 116.0 lb

## 2015-07-10 DIAGNOSIS — I7409 Other arterial embolism and thrombosis of abdominal aorta: Secondary | ICD-10-CM

## 2015-07-10 DIAGNOSIS — I340 Nonrheumatic mitral (valve) insufficiency: Secondary | ICD-10-CM | POA: Diagnosis not present

## 2015-07-10 DIAGNOSIS — I712 Thoracic aortic aneurysm, without rupture, unspecified: Secondary | ICD-10-CM

## 2015-07-10 DIAGNOSIS — I5042 Chronic combined systolic (congestive) and diastolic (congestive) heart failure: Secondary | ICD-10-CM | POA: Diagnosis not present

## 2015-07-10 NOTE — Progress Notes (Signed)
[address]       [place] [address]             [phone]     CARDIOTHORACIC SURGERY CONSULTATION REPORT  Referring Provider is Skeet Latch, MD PCP is Lilian Coma, MD  Chief Complaint  Patient presents with  . Mitral Regurgitation    Surgical eval, TEE 07/07/15, Cardiac Cath 07/08/15, PFT's 10/14/14    HPI:  [name] with recently discovered mitral regurgitation, chronic combined systolic and diastolic congestive heart failure, and syncope who has been referred for surgical consultation to discuss treatment options for management of severe symptomatic mitral regurgitation.  The patient describes a several history year history of persistent dry nonproductive cough. Over the past 1-1/2-2 years she has developed progressive exertional shortness of breath. More recently she has experienced worsening shortness of breath with frequent dizzy spells and at least 2 syncopal episodes.  She was evaluated in the emergency department where her blood pressure and heart rate were normal. Cardiac enzymes were negative. She has been followed intermittently by her primary care physician and referred to a neurologist. MRI of the brain and EEG were both unremarkable.  She was referred for cardiology consultation and evaluated by Dr. Oval Linsey on 05/29/2015. Transthoracic echocardiogram revealed moderate left ventricular systolic dysfunction with ejection fraction estimated 45-50%. There was severe mitral regurgitation with findings suggestive of restriction of the posterior leaflet. There was flow reversal in the pulmonary veins. There was severe left atrial enlargement.  Transesophageal echocardiogram confirmed the presence of at least moderate to severe mitral regurgitation and also suggested possible restriction of the posterior leaflet. Left heart catheterization was performed 07/08/2015 by Dr. Ellyn Hack. The patient was found have normal coronary  arteries with no significant coronary artery disease. There was mild to moderate left ventricular systolic dysfunction with ejection fraction estimated 40-45%. There was severe (4+) mitral regurgitation. Pulmonary artery pressures were not reported but the patient was noted to have moderate to severely reduced cardiac output and large V waves on PCWP waveform. The patient was referred for surgical consultation.  The patient is originally from [place] but has been living locally in [place] for the past 14 years. She is currently single and previously divorced. She works full-time as a Regulatory affairs officer in a Pharmacist, community in [place]. She has 2 adult children. Her son is in the [employer] and stationed in [place]. Her daughter lives in [place]. The patient states that she had been physically active all of her life. Over the past 1-2 years she has developed progressive exertional shortness breath with intermittent chest tightness. She now gets short of breath with minimal activity and frequently at rest. She cannot lay flat in bed. She has frequent dizzy spells and suffered several syncopal episodes. The 2 syncopal episodes she recalls both occurred while she was physically active. She states that now when she is active and she begins to feel dizzy she simply stops what she is doing and she recovers without coming close to passing out. She has not had lower extremity edema. She has a persistent dry nonproductive cough that has been present for nearly 4 years. Sometimes she states that her coughing spells or so severe that she develops chest wall pain. She denies history of hemoptysis. She denies any known history of rheumatic fever.  Past Medical History  Diagnosis Date  . Shortness of breath 05/29/2015  . Severe mitral regurgitation 07/03/2015  . Chronic combined systolic  and diastolic CHF (congestive heart failure) ([facility]) 07/03/2015    Grade 3 diastolic dysfunction.  LVEF 40-45% 05/2015.    Past  Surgical History  Procedure Laterality Date  . Ovarian cyst removal    . Tee without cardioversion N/A 07/07/2015    Procedure: TRANSESOPHAGEAL ECHOCARDIOGRAM (TEE);  Surgeon: Skeet Latch, MD;  Location: [facility];  Service: Cardiovascular;  Laterality: N/A;  . Cardiac catheterization N/A 07/08/2015    Procedure: Right/Left Heart Cath and Coronary Angiography;  Surgeon: Leonie Man, MD;  Location: [facility];  Service: Cardiovascular;  Laterality: N/A;    History reviewed. No pertinent family history.  Social History   Social History  . Marital Status: Married    Spouse Name: N/A  . Number of Children: 2  . Years of Education: N/A   Occupational History  . Seamtress    Social History Main Topics  . Smoking status: Never Smoker   . Smokeless tobacco: Never Used  . Alcohol Use: No  . Drug Use: No  . Sexual Activity: Not on file   Other Topics Concern  . Not on file   Social History Narrative    Current Outpatient Prescriptions  Medication Sig Dispense Refill  . psyllium (REGULOID) 0.52 G capsule Take 0.52 g by mouth daily.     No current facility-administered medications for this visit.    Allergies  Allergen Reactions  . Vicodin [Hydrocodone-Acetaminophen] Other (See Comments)    "she feels out of it"      Review of Systems:   General:  decreased appetite, decreased energy, no weight gain, + weight loss, no fever  Cardiac:  + chest pain with exertion, no chest pain at rest, + SOB with minimal exertion, + resting SOB, no PND, + orthopnea, + palpitations, no arrhythmia, no atrial fibrillation, no LE edema, + dizzy spells, + syncope  Respiratory:  + shortness of breath, no home oxygen, no productive cough, + chronic dry cough, no bronchitis, no wheezing, no hemoptysis, no asthma, occasional pain with inspiration or cough, no sleep apnea, no CPAP at night  GI:   + difficulty swallowing, no reflux, no frequent heartburn, no hiatal hernia, no  abdominal pain, + constipation, occasional diarrhea, no hematochezia, no hematemesis, no melena  GU:   no dysuria,  no frequency, no urinary tract infection, no hematuria, no kidney stones, no kidney disease  Vascular:  no pain suggestive of claudication, no pain in feet, no leg cramps, no varicose veins, no DVT, no non-healing foot ulcer  Neuro:   no stroke, no TIA's, no seizures, no headaches, no temporary blindness one eye,  no slurred speech, no peripheral neuropathy, no chronic pain, no instability of gait, no memory/cognitive dysfunction  Musculoskeletal: no arthritis, no joint swelling, + myalgias, no difficulty walking, normal mobility   Skin:   no rash, no itching, no skin infections, no pressure sores or ulcerations  Psych:   + anxiety, + depression, no nervousness, no unusual recent stress  Eyes:   + blurry vision, no floaters, + recent vision changes, + wears glasses or contacts  ENT:   no hearing loss, no loose or painful teeth, no dentures, last saw dentist 05/2015  Hematologic:  no easy bruising, no abnormal bleeding, no clotting disorder, no frequent epistaxis  Endocrine:  no diabetes, does not check CBG's at home     Physical Exam:   BP 101/64 mmHg  Pulse 90  Resp 20  Ht _0  (1.575 m)  Wt 116 lb (52.617  kg)  BMI 21.21 kg/m2  SpO2 98%  General:    well-appearing  HEENT:  Unremarkable   Neck:   no JVD, no bruits, no adenopathy   Chest:   clear to auscultation, symmetrical breath sounds, no wheezes, no rhonchi   CV:   RRR, faint systolic murmur axilla  Abdomen:  soft, non-tender, no masses   Extremities:  warm, well-perfused, pulses diminished, no LE edema  Rectal/GU  Deferred  Neuro:   Grossly non-focal and symmetrical throughout  Skin:   Clean and dry, no rashes, no breakdown   Diagnostic Tests:  Transthoracic Echocardiography  Patient:  Diane [name], Diane [name] MR #:    [id number] Study Date: 06/04/2015 Gender:   F Age:    34 Height:   157.5 cm Weight:    53.5 kg BSA:    1.53 m^2 Pt. Status: Room:  SONOGRAPHER Wyatt Mage, RDCS ATTENDING  Lyman Bishop MD PERFORMING  [facility] ORDERING   Skeet Latch, MD [place], MD  cc:  ------------------------------------------------------------------- LV EF: 45% -  50%  ------------------------------------------------------------------- Indications:   Shortness of breath (R06.02).  ------------------------------------------------------------------- History:  Risk factors: Faintness.  ------------------------------------------------------------------- Study Conclusions  - Left ventricle: The cavity size was normal. Wall thickness was increased in a pattern of mild LVH. Systolic function was mildly reduced. The estimated ejection fraction was in the range of 45% to 50%. Abnormal GLPSS at -11%, with inferior and inferolateral strain abnormality. Basal to mid inferolateral wall hypokinesis. Doppler parameters are consistent with restrictive left ventricular relaxation (grade 3 diastolic dysfunction). The E/A ratio is >2.5. The E/e&' ratio is >20, suggesting markedly elevated LV filling pressure. - Mitral valve: Mildly thickened leaflets with tethering of the posterior leaflet. There is severe, posteriorly directed regurgitation which hugs the atrial free wall. Reversal of flow is seen in the pulmonary vein. - Left atrium: Severely dilated at 52 ml/m2. - Right atrium: The atrium was mildly dilated. - Tricuspid valve: There was mild regurgitation. - Pulmonary arteries: PA peak pressure: 41 mm Hg (S). - Inferior vena cava: The vessel was normal in size. The respirophasic diameter changes were in the normal range (>= 50%), consistent with normal central venous pressure. - Pericardium, extracardiac: A trivial pericardial effusion was identified posterior to the heart. Features were not consistent with  tamponade physiology.  Impressions:  - LVEF 45-50%, mild LVH, abnormal GLPSS at -11% with inferolateral strain and wall motion abnormalities. There is tethering of the posterior mitral leaflet and severe posteriorly directed mitral regurgitation. Restrictive diastolic dysfunction with likely elevated LV filling pressures. Severely dilated LA at 52 ml/m2, mild TR, RVSP elevated at 41 mmHg (consistent with mild pulmonary venous hypertension), trivial posterior pericardial effusion without tamponade physiology.  Transthoracic echocardiography. M-mode, complete 2D, spectral Doppler, and color Doppler. Birthdate: Patient birthdate: 10/26/1960. Age: Patient is 54 yr old. Sex: Gender: female. BMI: 21.6 kg/m^2. Blood pressure:   106/72 Patient status: Outpatient. Study date: Study date: 06/04/2015. Study time: 10:20 AM. Location: [facility]  -------------------------------------------------------------------  ------------------------------------------------------------------- Left ventricle: The cavity size was normal. Wall thickness was increased in a pattern of mild LVH. Systolic function was mildly reduced. The estimated ejection fraction was in the range of 45% to 50%. Basal to mid inferolateral wall hypokinesis. Doppler parameters are consistent with restrictive left ventricular relaxation (grade 3 diastolic dysfunction). The E/A ratio is >2.5. The E/e&' ratio is >20, suggesting markedly elevated LV filling pressure.  ------------------------------------------------------------------- Aortic valve:  Structurally normal valve. Trileaflet. Cusp separation was normal. Doppler:  Transvalvular velocity was within the normal range. There was no stenosis. There was no regurgitation.  ------------------------------------------------------------------- Aorta: Aortic root: The aortic root was normal in size. Ascending aorta: The ascending aorta was  normal in size.  ------------------------------------------------------------------- Mitral valve: Mildly thickened leaflets with tethering of the posterior leaflet. There is severe, posteriorly directed regurgitation which hugs the atrial free wall. Reversal of flow is seen in the pulmonary vein. Doppler:   Peak gradient (D): 5 mm Hg.  ------------------------------------------------------------------- Left atrium: Severely dilated at 52 ml/m2.  ------------------------------------------------------------------- Right ventricle: The cavity size was normal. Wall thickness was normal. Systolic function was normal.  ------------------------------------------------------------------- Pulmonic valve:  The valve appears to be grossly normal. Doppler: There was trivial regurgitation.  ------------------------------------------------------------------- Tricuspid valve:  Doppler: There was mild regurgitation.  ------------------------------------------------------------------- Pulmonary artery:  The main pulmonary artery was normal-sized.  ------------------------------------------------------------------- Right atrium: The atrium was mildly dilated.  ------------------------------------------------------------------- Pericardium: A trivial pericardial effusion was identified posterior to the heart. Doppler: Features were not consistent with tamponade physiology.  ------------------------------------------------------------------- Systemic veins: Inferior vena cava: The vessel was normal in size. The respirophasic diameter changes were in the normal range (>= 50%), consistent with normal central venous pressure.  ------------------------------------------------------------------- Measurements  Left ventricle            Value    Reference LV ID, ED, PLAX chordal        44.6 mm   43 - 52 LV ID, ES, PLAX chordal        30.5 mm    23 - 38 LV fx shortening, PLAX chordal    32  %   >=29 LV PW thickness, ED          11.4 mm   --------- IVS/LV PW ratio, ED          1.01     <=1.3 Stroke volume, 2D           49  ml   --------- Stroke volume/bsa, 2D         32  ml/m^2 --------- LV e&', lateral            5.51 cm/s  --------- LV E/e&', lateral           20.69    --------- LV e&', medial             5.12 cm/s  --------- LV E/e&', medial            22.27    --------- LV e&', average            5.32 cm/s  --------- LV E/e&', average           21.45    --------- Longitudinal strain, TDI       11  %   ---------  Ventricular septum          Value    Reference IVS thickness, ED           11.5 mm   ---------  LVOT                 Value    Reference LVOT ID, S              18  mm   --------- LVOT area               2.54 cm^2  --------- LVOT peak velocity, S         82  cm/s  --------- LVOT mean velocity, S  60.1 cm/s  --------- LVOT VTI, S              19.4 cm   ---------  Aorta                 Value    Reference Aortic root ID, ED          27  mm   --------- Ascending aorta ID, A-P, S      30  mm   ---------  Left atrium              Value    Reference LA ID, A-P, ES            44  mm   --------- LA ID/bsa, A-P         (H)  2.87 cm/m^2 <=2.2 LA volume, S             80.1 ml   --------- LA volume/bsa, S           52.2 ml/m^2 --------- LA volume, ES, 1-p A4C        82.1 ml   --------- LA volume/bsa, ES, 1-p A4C      53.6 ml/m^2 --------- LA volume, ES, 1-p A2C        72.7  ml   --------- LA volume/bsa, ES, 1-p A2C      47.4 ml/m^2 ---------  Mitral valve             Value    Reference Mitral E-wave peak velocity      114  cm/s  --------- Mitral A-wave peak velocity      40.7 cm/s  --------- Mitral deceleration time    (L)  140  ms   150 - 230 Mitral peak gradient, D        5   mm Hg --------- Mitral E/A ratio, peak        2.8     --------- Mitral regurg VTI, PISA        139  cm   --------- Mitral ERO, PISA           0.36 cm^2  --------- Mitral regurg volume, PISA      50  ml   ---------  Pulmonary arteries          Value    Reference PA pressure, S, DP       (H)  41  mm Hg <=30  Systemic veins            Value    Reference Estimated CVP             3   mm Hg ---------  Right ventricle            Value    Reference RV s&', lateral, S           10.2 cm/s  ---------  Legend: (L) and (H) mark values outside specified reference range.  ------------------------------------------------------------------- Prepared and Electronically Authenticated by  Lyman Bishop MD 2016-11-16T15:59:27    Transesophageal Echocardiography  Patient:  Diane [name], Diane [name] MR #:    [id number] Study Date: 07/07/2015 Gender:   F Age:    68 Height:   157.5 cm Weight:   52.7 kg BSA:    1.52 m^2 Pt. Status: Room:  SONOGRAPHER Tresa Res, RDCS ADMITTING  Skeet Latch, MD ATTENDING  Skeet Latch, MD ORDERING   Skeet Latch, MD PERFORMING  Skeet Latch, MD [place], MD  cc:  ------------------------------------------------------------------- LV  EF: 50% -  55%  ------------------------------------------------------------------- Indications:   Mitral regurgitation  424.0.  ------------------------------------------------------------------- Study Conclusions  - Left ventricle: Systolic function was normal. The estimated ejection fraction was in the range of 50% to 55%. Wall motion was normal; there were no regional wall motion abnormalities. - Aortic valve: There was trivial regurgitation. - Mitral valve: Mobility of the posterior leaflet was mildly restricted. There was moderate regurgitation originating at the P3 segment. - Left atrium: The atrium was dilated. No evidence of thrombus in the atrial cavity or appendage. No evidence of thrombus in the atrial cavity or appendage. - Left upper pulmonary vein: There was no systolic flow reversal. - Right ventricle: The cavity size was normal. Wall thickness was normal. Systolic function was normal. - Right atrium: No evidence of thrombus in the atrial cavity or appendage. - Atrial septum: No defect or patent foramen ovale was identified. - Inferior vena cava: Eustacian valve noted. - Pericardium, extracardiac: A trivial pericardial effusion was identified.  Diagnostic transesophageal echocardiography. 2D and color Doppler. Birthdate: Patient birthdate: 03-11-1961. Age: Patient is 54 yr old. Sex: Gender: female.  BMI: 21.3 kg/m^2. Blood pressure: 106/82 Patient status: Outpatient. Study date: Study date: 07/07/2015. Study time: 12:54 PM. Location: [facility].  -------------------------------------------------------------------  ------------------------------------------------------------------- Left ventricle: Systolic function was normal. The estimated ejection fraction was in the range of 50% to 55%. Wall motion was normal; there were no regional wall motion abnormalities.  ------------------------------------------------------------------- Aortic valve:  Structurally normal valve. Trileaflet; normal thickness leaflets. Cusp separation was normal.  Doppler:  There was no stenosis.  There was trivial regurgitation.  ------------------------------------------------------------------- Aorta: There was no atheroma. There was no evidence for dissection. Aortic root: The aortic root was not dilated. Ascending aorta: The ascending aorta was normal in size. Aortic arch: The aortic arch was normal in size. Descending aorta: The descending aorta was normal in size.  ------------------------------------------------------------------- Mitral valve: Leaflet separation was normal. Mobility of the posterior leaflet was mildly restricted. Doppler: There was moderate regurgitation originating at the P3 segment.  ------------------------------------------------------------------- Left atrium: The atrium was dilated. No evidence of thrombus in the atrial cavity or appendage. No evidence of thrombus in the atrial cavity or appendage. The appendage was morphologically a left appendage, multilobulated, and of normal size. Emptying velocity was normal.  ------------------------------------------------------------------- Atrial septum: No defect or patent foramen ovale was identified.  ------------------------------------------------------------------- Pulmonary veins: Left upper pulmonary vein: There was no systolic flow reversal.  ------------------------------------------------------------------- Right ventricle: The cavity size was normal. Wall thickness was normal. Systolic function was normal.  ------------------------------------------------------------------- Pulmonic valve:  Structurally normal valve.  Doppler: There was trivial regurgitation.  ------------------------------------------------------------------- Tricuspid valve:  Structurally normal valve.  Leaflet separation was normal. Doppler: There was mild regurgitation.  ------------------------------------------------------------------- Pulmonary artery:   The main pulmonary artery was normal-sized.  ------------------------------------------------------------------- Right atrium: The atrium was normal in size. No evidence of thrombus in the atrial cavity or appendage. The appendage was morphologically a right appendage.  ------------------------------------------------------------------- Pericardium: A trivial pericardial effusion was identified.  ------------------------------------------------------------------- Systemic veins: Inferior vena cava: Eustacian valve noted.  ------------------------------------------------------------------- Measurements  LVOT                 Value LVOT ID, S              17.2 mm LVOT area              2.32 cm^2 LVOT peak velocity, S        79.59 cm/s LVOT VTI, S  14.57 cm Stroke volume (SV), LVOT DP     33.9 ml Stroke index (SV/bsa), LVOT DP    22.3 ml/m^2  Mitral valve             Value Mitral annulus diameter, A-P     28.5 mm Mitral regurg VTI, PISA       147  cm Mitral ERO, PISA           0.34 cm^2 Mitral regurg volume, PISA      50  ml Mitral regurg fraction, PISA     59.63 %  Tricuspid valve           Value Tricuspid regurg peak velocity    269  cm/s Tricuspid peak RV-RA gradient    29  mm Hg  Legend: (L) and (H) mark values outside specified reference range.  ------------------------------------------------------------------- Prepared and Electronically Authenticated by  Skeet Latch, MD 2016-12-19T14:42:59       CARDIAC CATHETERIZATION Procedures    Right/Left Heart Cath and Coronary Angiography    Conclusion     Severe mitral regurgitation with large V waves and 4+ MR  There is mild to moderate left ventricular systolic dysfunction. With cardiac output/index moderate-severely  reduced.  Angiographically normal coronary arteries   Angiographic and Right Heart Cath confirmed severe MR with normal PA pressures. Consistently reduced EF by LV gram as compared to echocardiogram. Cardiac output also confirms at least moderately reduced function.   Plan: Standard post radial/brachial cath care with sheath removal. Expected discharge following bed rest  Patient will follow-up with Dr. Oval Linsey plans to be referred to Dr. Roxy Manns for scheduling of MVR.   Leonie Man, M.D., M.S. Interventional Cardiologist   Pager # [phone]      Indications    Severe mitral regurgitation [I34.0 (ICD-10-CM)]   Preoperative testing [V40.086 (ICD-10-CM)]    Technique and Indications    PCP: Lilian Coma, MD Cardiologist: Sharol Harness, MD   Ms. Gretna is a very pleasant 54 year old woman who was undergoing evaluation for syncope. She underwent echocardiography that revealed severe mitral regurgitation and a reduced LVEF (45-50%). The MR was due to tethering of the posterior leaflet. She was also noted to have grade 3 diastolic dysfunction with mid basal to mid inferolateral wall hypokinesis. MR was noted on TEE as well. She is now referred for right and left heart catheterization for part of her preoperative evaluation.  PROCEDURE: Estimated blood loss <50 mL. There were no immediate complications during the procedure. Time Out: Verified patient identification, verified procedure, site/side was marked, verified correct patient position, special equipment/implants available, medications/allergies/relevent history reviewed, required imaging and test results available. Performed.  The patient was sedated with 25 g IV fentanyl, 1 mgIV Versed 500 mL NS Bolus for cocktail related hypotension.  Access:  RIGHT Radial Artery: 6 Fr sheath -- Seldinger technique using Angiocath Micropuncture Kit * 10 mL radial cocktail IA; Radial Cocktail: 5 mg  Verapamil, 400 mcg NTG, 2 ml 2% Lidocaine in 10 ml NS * 3000 Units IV Heparin Right Brachial/Antecubital Vein: The existing 18-gauge IV was exchanged over a wire for a 5Fr short sheath  Right Heart Catheterization: 5 Fr Gordy Councilman catheter advanced under fluoroscopy with balloon inflated to the RA, RV, then PCWP-PA for hemodynamic measurement.  Simultaneous FA & PA blood gases checked for SaO2% to calculate FICK CO/CI  Catheter removed completely out of the body with balloon deflated.  Left Heart Catheterization: 5Fr Catheters advanced or exchanged over a J-wire under direct  fluoroscopic guidance into the ascending aorta; TIG 4.0 catheter advanced first.  LV Hemodynamics (LV Gram): Angled Pigtail Catheter Left Coronary Artery Cineangiography: TIG 4.0 Catheter  Right Coronary Artery Cineangiography: TIG 4.0 Catheter   Brachial Sheath(s) removed in the PACU Holding Area with manual pressure for hemostasis.   Radial sheath removed in the Cardiac Catheterization lab with TR Band placed for hemostasis.  TR Band: 1615 Hours; 11 mL air    Coronary Findings    Dominance: Right   Left Main  . Vessel is large. Vessel is angiographically normal.     Left Anterior Descending  . Vessel is large. Vessel is angiographically normal. Tapers to moderate and small caliber vessel as it wraps the apex   . First Diagonal Branch   The vessel is moderate in size.   . Lateral First Diagonal Branch   The vessel is small in size.   . First Septal Branch   The vessel is moderate in size.   Marland Kitchen Second Septal Branch   The vessel is small in size.   . Third Septal Branch   The vessel is small in size.     Ramus Intermedius  . Vessel is small.     Left Circumflex  . Vessel is angiographically normal. Almost codominant vessel   . First Obtuse Marginal Branch   The vessel is large in size and is angiographically normal. The vessel is tortuous.   . Second Obtuse Marginal Branch   The vessel is moderate  in size.   . Lateral Second Obtuse Marginal Branch   The vessel is small in size.     Right Coronary Artery  . Vessel is large. Vessel is angiographically normal.   . Acute Marginal Branch   The vessel is small in size.   . Right Posterior Descending Artery   The vessel is moderate in size and is angiographically normal.   . Right Posterior Atrioventricular Branch   The vessel is small in size.   . First Right Posterolateral   The vessel is angiographically normal.       Right Heart Pressures Hemodynamic findings consistent with mitral valve regurgitation. LV EDP is normal. Significant V wave on PCWP waveform. 3-4+ MR on LV Gram  FICK CO/CI: 3.49 / 2.3 -- moderate-severely reduced. PVR 160 D/S (244 D/DI); TPVR: 527 D/C / 801 D/DI SVR 1329 D/X (2020 D/DI); TSVR 1397 D/S/ (2124 D/DI))    Wall Motion                 Left Heart    Left Ventricle The left ventricular size is normal. There is mild to moderate left ventricular systolic dysfunction. The left ventricular ejection fraction is 35-45% by visual estimate. There are wall motion abnormalities in the left ventricle. There is global hypokinesis of the left ventricle. EF ~40-45%   Mitral Valve There is no mitral valve stenosis, severe (4+) mitral regurgitation and mitral valve prolapse present. Posterior leaflet.   Aortic Valve There is no aortic valve stenosis.   Aorta The size of the ascending aorta is normal.    Coronary Diagrams    Diagnostic Diagram            Impression:  Patient has stage D severe symptomatic primary mitral regurgitation. She presents with progressive symptoms of exertional shortness of breath, chest tightness, dizzy spells, and 2 recent syncopal episodes. She also has a persistent dry nonproductive cough. I have personally reviewed the patient's recent transthoracic and transesophageal echocardiograms. The patient's  symptoms seem to be disproportionate to the severity of  regurgitation demonstrated on recent echocardiograms and her murmur is not very impressive on exam.  However, the patient clearly has severe mitral regurgitation and the degree of LV systolic dysfunction and left atrial enlargement suggest this is chronic. Both echocardiograms demonstrate the presence of at least mild to moderate global left ventricular systolic dysfunction and some restriction of the posterior leaflet of the mitral valve.  However, features are not suggestive of rheumatic disease.  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  Diagnostic cardiac catheterization is notable for the absence of significant coronary artery disease and confirms the presence of severe mitral regurgitation. I agree the patient would best be treated with mitral valve repair. Based upon review of the patient's transthoracic and transesophageal echocardiograms I remain hopeful that her valve should be, although the specific etiology of her valvular disease remains unclear at this time.  The patient may be a good candidate for minimally invasive approach for surgery.   Plan:  The patient was counseled at length regarding the indications, risks and potential benefits of mitral valve repair.  The rationale for elective surgery has been explained, including a comparison between surgery and continued medical therapy with close follow-up.  The likelihood of successful and durable valve repair has been discussed with particular reference to the findings of their recent echocardiogram.  Based upon these findings and previous experience, I have quoted them a greater than 90 percent likelihood of successful valve repair.  In the unlikely event that their valve cannot be successfully repaired, we discussed the possibility of replacing the mitral valve using a mechanical prosthesis with the attendant need for long-term anticoagulation versus the alternative of replacing it  using a bioprosthetic tissue valve with its potential for late structural valve deterioration and failure, depending upon the patient's longevity.  The patient specifically requests that if the mitral valve must be replaced that it be done using a bioprosthetic tissue valve.   She does not feel that she could take any type of medication in the long-term, especially warfarin.  The patient understands and accepts all potential risks of surgery including but not limited to risk of death, stroke or other neurologic complication, myocardial infarction, congestive heart failure, respiratory failure, renal failure, bleeding requiring transfusion and/or reexploration, arrhythmia, infection or other wound complications, pneumonia, pleural and/or pericardial effusion, pulmonary embolus, aortic dissection or other major vascular complication, or delayed complications related to valve repair or replacement including but not limited to structural valve deterioration and failure, thrombosis, embolization, endocarditis, or paravalvular leak.  Alternative surgical approaches have been discussed including a comparison between conventional sternotomy and minimally-invasive techniques.  The relative risks and benefits of each have been reviewed as they pertain to the patient's specific circumstances, and all of their questions have been addressed.  Specific risks potentially related to the minimally-invasive approach were discussed at length, including but not limited to risk of conversion to full or partial sternotomy, aortic dissection or other major vascular complication, unilateral acute lung injury or pulmonary edema, phrenic nerve dysfunction or paralysis, rib fracture, chronic pain, lung hernia, or lymphocele.  All of her questions have been answered.  We plan to proceed with surgery on Tuesday, 07/22/2015. The patient will undergo CT angiography to evaluate the feasibility of peripheral cannulation for surgery.    I  spent in excess of 90 minutes during the conduct of this office consultation and >50% of this time involved  direct face-to-face encounter with the patient for counseling and/or coordination of their care.   Valentina Gu. Roxy Manns, MD 07/10/2015 5:52 PM

## 2015-07-10 NOTE — Patient Instructions (Signed)
Continue all previous medications without any changes at this time  

## 2015-07-11 ENCOUNTER — Encounter: Payer: Self-pay | Admitting: *Deleted

## 2015-07-15 ENCOUNTER — Other Ambulatory Visit: Payer: Self-pay | Admitting: *Deleted

## 2015-07-15 DIAGNOSIS — I340 Nonrheumatic mitral (valve) insufficiency: Secondary | ICD-10-CM

## 2015-07-16 ENCOUNTER — Ambulatory Visit
Admission: RE | Admit: 2015-07-16 | Discharge: 2015-07-16 | Disposition: A | Discharge status: Home / Self Care | Payer: 59 | Source: Ambulatory Visit | Attending: Thoracic Surgery (Cardiothoracic Vascular Surgery) | Admitting: Thoracic Surgery (Cardiothoracic Vascular Surgery)

## 2015-07-16 DIAGNOSIS — I712 Thoracic aortic aneurysm, without rupture, unspecified: Secondary | ICD-10-CM

## 2015-07-16 DIAGNOSIS — I7409 Other arterial embolism and thrombosis of abdominal aorta: Secondary | ICD-10-CM

## 2015-07-16 DIAGNOSIS — I340 Nonrheumatic mitral (valve) insufficiency: Secondary | ICD-10-CM

## 2015-07-16 DIAGNOSIS — Q250 Patent ductus arteriosus: Secondary | ICD-10-CM

## 2015-07-16 HISTORY — DX: Patent ductus arteriosus: Q25.0

## 2015-07-16 MED ORDER — IOPAMIDOL (ISOVUE-370) INJECTION 76%
75.0000 mL | Freq: Once | INTRAVENOUS | Status: AC | PRN
  Administered 2015-07-16: 75 mL via INTRAVENOUS

## 2015-07-17 ENCOUNTER — Other Ambulatory Visit: Payer: Self-pay

## 2015-07-17 ENCOUNTER — Ambulatory Visit (HOSPITAL_COMMUNITY)
Admission: RE | Admit: 2015-07-17 | Discharge: 2015-07-17 | Disposition: A | Discharge status: Home / Self Care | Payer: 59 | Source: Ambulatory Visit | Attending: Thoracic Surgery (Cardiothoracic Vascular Surgery) | Admitting: Thoracic Surgery (Cardiothoracic Vascular Surgery)

## 2015-07-17 ENCOUNTER — Encounter (HOSPITAL_COMMUNITY): Payer: 59

## 2015-07-17 ENCOUNTER — Encounter (HOSPITAL_COMMUNITY)
Admission: RE | Admit: 2015-07-17 | Discharge: 2015-07-17 | Disposition: A | Discharge status: Home / Self Care | Payer: 59 | Source: Ambulatory Visit | Attending: Thoracic Surgery (Cardiothoracic Vascular Surgery) | Admitting: Thoracic Surgery (Cardiothoracic Vascular Surgery)

## 2015-07-17 ENCOUNTER — Other Ambulatory Visit: Payer: Self-pay | Admitting: *Deleted

## 2015-07-17 ENCOUNTER — Encounter (HOSPITAL_COMMUNITY): Payer: Self-pay | Admitting: Thoracic Surgery (Cardiothoracic Vascular Surgery)

## 2015-07-17 ENCOUNTER — Encounter (HOSPITAL_COMMUNITY): Payer: Self-pay

## 2015-07-17 VITALS — BP 110/49 | HR 85 | Temp 97.7°F | Resp 18 | Ht 62.0 in | Wt 115.9 lb

## 2015-07-17 DIAGNOSIS — I6521 Occlusion and stenosis of right carotid artery: Secondary | ICD-10-CM | POA: Insufficient documentation

## 2015-07-17 DIAGNOSIS — I340 Nonrheumatic mitral (valve) insufficiency: Secondary | ICD-10-CM | POA: Insufficient documentation

## 2015-07-17 DIAGNOSIS — Z01818 Encounter for other preprocedural examination: Secondary | ICD-10-CM | POA: Insufficient documentation

## 2015-07-17 DIAGNOSIS — R0789 Other chest pain: Secondary | ICD-10-CM | POA: Diagnosis not present

## 2015-07-17 DIAGNOSIS — R0602 Shortness of breath: Secondary | ICD-10-CM | POA: Diagnosis not present

## 2015-07-17 DIAGNOSIS — R05 Cough: Secondary | ICD-10-CM | POA: Diagnosis not present

## 2015-07-17 DIAGNOSIS — I517 Cardiomegaly: Secondary | ICD-10-CM | POA: Diagnosis not present

## 2015-07-17 HISTORY — DX: Major depressive disorder, single episode, unspecified: F32.9

## 2015-07-17 HISTORY — DX: Depression, unspecified: F32.A

## 2015-07-17 LAB — COMPREHENSIVE METABOLIC PANEL
ALBUMIN: 4.1 g/dL (ref 3.5–5.0)
ALK PHOS: 70 U/L (ref 38–126)
ALT: 23 U/L (ref 14–54)
AST: 25 U/L (ref 15–41)
Anion gap: 11 (ref 5–15)
BILIRUBIN TOTAL: 1 mg/dL (ref 0.3–1.2)
BUN: 11 mg/dL (ref 6–20)
CALCIUM: 9.8 mg/dL (ref 8.9–10.3)
CO2: 24 mmol/L (ref 22–32)
CREATININE: 0.47 mg/dL (ref 0.44–1.00)
Chloride: 102 mmol/L (ref 101–111)
GFR calc Af Amer: >60 mL/min (ref >60)
GFR calc non Af Amer: >60 mL/min (ref >60)
GLUCOSE: 95 mg/dL (ref 65–99)
Potassium: 4 mmol/L (ref 3.5–5.1)
Sodium: 137 mmol/L (ref 135–145)
TOTAL PROTEIN: 7.9 g/dL (ref 6.5–8.1)

## 2015-07-17 LAB — CBC
HEMATOCRIT: 37.3 % (ref 36.0–46.0)
HEMOGLOBIN: 12.5 g/dL (ref 12.0–15.0)
MCH: 32 pg (ref 26.0–34.0)
MCHC: 33.5 g/dL (ref 30.0–36.0)
MCV: 95.4 fL (ref 78.0–100.0)
Platelets: 219 10*3/uL (ref 150–400)
RBC: 3.91 MIL/uL (ref 3.87–5.11)
RDW: 12.2 % (ref 11.5–15.5)
WBC: 5.8 10*3/uL (ref 4.0–10.5)

## 2015-07-17 LAB — URINALYSIS, ROUTINE W REFLEX MICROSCOPIC
Bilirubin Urine: NEGATIVE
Glucose, UA: NEGATIVE mg/dL
Hgb urine dipstick: NEGATIVE
Ketones, ur: NEGATIVE mg/dL
NITRITE: POSITIVE — AB
PROTEIN: NEGATIVE mg/dL
SPECIFIC GRAVITY, URINE: 1.007 (ref 1.005–1.030)
pH: 7 (ref 5.0–8.0)

## 2015-07-17 LAB — SURGICAL PCR SCREEN
MRSA, PCR: NEGATIVE
STAPHYLOCOCCUS AUREUS: NEGATIVE

## 2015-07-17 LAB — BLOOD GAS, ARTERIAL
Acid-Base Excess: 2.4 mmol/L — ABNORMAL HIGH (ref 0.0–2.0)
BICARBONATE: 26.3 meq/L — AB (ref 20.0–24.0)
Drawn by: 421801
FIO2: 0.21
O2 Saturation: 98 %
PATIENT TEMPERATURE: 98.6
PH ART: 7.434 (ref 7.350–7.450)
TCO2: 27.6 mmol/L (ref 0–100)
pCO2 arterial: 40 mmHg (ref 35.0–45.0)
pO2, Arterial: 109 mmHg — ABNORMAL HIGH (ref 80.0–100.0)

## 2015-07-17 LAB — APTT: aPTT: 23 seconds — ABNORMAL LOW (ref 24–37)

## 2015-07-17 LAB — URINE MICROSCOPIC-ADD ON: RBC / HPF: NONE SEEN RBC/hpf (ref 0–5)

## 2015-07-17 LAB — PROTIME-INR
INR: 1.03 (ref 0.00–1.49)
Prothrombin Time: 13.7 seconds (ref 11.6–15.2)

## 2015-07-17 LAB — ABO/RH: ABO/RH(D): A POS

## 2015-07-17 NOTE — Telephone Encounter (Signed)
Cipro 500 mg BID x 5 days. RX called into pharm  - [facility].

## 2015-07-17 NOTE — Progress Notes (Signed)
VASCULAR LAB PRELIMINARY  PRELIMINARY  PRELIMINARY  PRELIMINARY  Pre-op Cardiac Surgery  Carotid Findings:  Right -  1-39% ICA stenosis.  Vertebral artery flow is antegrade.  Left - No evidence of significant ICA stenosis. Vertebral artery flow is antegrade   Upper Extremity Right Left  Brachial Pressures 124 Triphasic 123 triphasic  Radial Waveforms Triphasic Triphasuc  Ulnar Waveforms Triphasic Triphasic  Palmar Arch (Allen's Test) Normal Abnormal   Findings:  Right - Doppler waveforms remained normal with both radial and ulnar compressions. Left - Doppler waveforms remained normal with radial compression and reversed with ulnar compression.   Findings:  Pedal pulses were palpable bilaterally.   Latanya Hemmer, RVS 07/17/2015, 11:30 AM

## 2015-07-17 NOTE — H&P (Addendum)
[address]       [place] [address]             [phone]          CARDIOTHORACIC SURGERY HISTORY AND PHYSICAL EXAM  Referring Provider is Skeet Latch, MD PCP is Lilian Coma, MD  Chief Complaint  Patient presents with  . Mitral Regurgitation    Surgical eval, TEE 07/07/15, Cardiac Cath 07/08/15, PFT's 10/14/14    HPI:  [name] with recently discovered mitral regurgitation, chronic combined systolic and diastolic congestive heart failure, and syncope who has been referred for surgical consultation to discuss treatment options for management of severe symptomatic mitral regurgitation. The patient describes a several history year history of persistent dry nonproductive cough. Over the past 1-1/2-2 years she has developed progressive exertional shortness of breath. More recently she has experienced worsening shortness of breath with frequent dizzy spells and at least 2 syncopal episodes. She was evaluated in the emergency department where her blood pressure and heart rate were normal. Cardiac enzymes were negative. She has been followed intermittently by her primary care physician and referred to a neurologist. MRI of the brain and EEG were both unremarkable. She was referred for cardiology consultation and evaluated by Dr. Oval Linsey on 05/29/2015. Transthoracic echocardiogram revealed moderate left ventricular systolic dysfunction with ejection fraction estimated 45-50%. There was severe mitral regurgitation with findings suggestive of restriction of the posterior leaflet. There was flow reversal in the pulmonary veins. There was severe left atrial enlargement. Transesophageal echocardiogram confirmed the presence of at least moderate to severe mitral regurgitation and also suggested possible restriction of the posterior leaflet. Left heart catheterization was performed 07/08/2015 by Dr. Ellyn Hack. The patient was found have  normal coronary arteries with no significant coronary artery disease. There was mild to moderate left ventricular systolic dysfunction with ejection fraction estimated 40-45%. There was severe (4+) mitral regurgitation. Pulmonary artery pressures were not reported but the patient was noted to have moderate to severely reduced cardiac output and large V waves on PCWP waveform. The patient was referred for surgical consultation.  The patient is originally from [place] but has been living locally in [place] for the past 14 years. She is currently single and previously divorced. She works full-time as a Regulatory affairs officer in a Pharmacist, community in [place]. She has 2 adult children. Her son is in the [employer] and stationed in [place]. Her daughter lives in [place]. The patient states that she had been physically active all of her life. Over the past 1-2 years she has developed progressive exertional shortness breath with intermittent chest tightness. She now gets short of breath with minimal activity and frequently at rest. She cannot lay flat in bed. She has frequent dizzy spells and suffered several syncopal episodes. The 2 syncopal episodes she recalls both occurred while she was physically active. She states that now when she is active and she begins to feel dizzy she simply stops what she is doing and she recovers without coming close to passing out. She has not had lower extremity edema. She has a persistent dry nonproductive cough that has been present for nearly 4 years. Sometimes she states that her coughing spells or so severe that she develops chest wall pain. She denies history of hemoptysis. She denies any known history of rheumatic fever.         Past Medical History  Diagnosis Date  . Shortness of breath 05/29/2015  .  Severe mitral regurgitation 07/03/2015  . Chronic combined systolic and diastolic CHF (congestive heart failure) ([facility]) 07/03/2015    Grade 3 diastolic dysfunction.  LVEF  40-45% 05/2015.  Marland Kitchen Aortic aneurysm ([place])   . Patent ductus arteriosus 07/16/2015    Past Surgical History  Procedure Laterality Date  . Ovarian cyst removal    . Tee without cardioversion N/A 07/07/2015    Procedure: TRANSESOPHAGEAL ECHOCARDIOGRAM (TEE);  Surgeon: Skeet Latch, MD;  Location: [facility];  Service: Cardiovascular;  Laterality: N/A;  . Cardiac catheterization N/A 07/08/2015    Procedure: Right/Left Heart Cath and Coronary Angiography;  Surgeon: Leonie Man, MD;  Location: [facility];  Service: Cardiovascular;  Laterality: N/A;    No family history on file.  Social History Social History  Substance Use Topics  . Smoking status: Never Smoker   . Smokeless tobacco: Never Used  . Alcohol Use: No    Prior to Admission medications   Medication Sig Start Date End Date Taking? Authorizing Provider  Multiple Vitamin (MULTIVITAMIN) tablet Take 1 tablet by mouth daily.   Yes Historical Provider, MD  naproxen sodium (ANAPROX) 220 MG tablet Take 220 mg by mouth 2 (two) times daily as needed (for pain).   Yes Historical Provider, MD  psyllium (REGULOID) 0.52 G capsule Take 0.52 g by mouth daily.   Yes Historical Provider, MD    Allergies  Allergen Reactions  . Vicodin [Hydrocodone-Acetaminophen] Other (See Comments)    "she feels out of it", hallucinating  . Silicone Itching and Rash    TAPE ALLERGY/EKG STICKER ALLERGY, Please use "paper" tape only     Review of Systems:  General:decreased appetite, decreased energy, no weight gain, + weight loss, no fever Cardiac:+ chest pain with exertion, no chest pain at rest, + SOB with minimal exertion, + resting SOB, no PND, + orthopnea, + palpitations, no arrhythmia, no atrial fibrillation, no LE edema, + dizzy spells, + syncope Respiratory:+ shortness of breath, no home oxygen, no productive cough, + chronic dry  cough, no bronchitis, no wheezing, no hemoptysis, no asthma, occasional pain with inspiration or cough, no sleep apnea, no CPAP at night GI:+ difficulty swallowing, no reflux, no frequent heartburn, no hiatal hernia, no abdominal pain, + constipation, occasional diarrhea, no hematochezia, no hematemesis, no melena GU:no dysuria, no frequency, no urinary tract infection, no hematuria, no kidney stones, no kidney disease Vascular:no pain suggestive of claudication, no pain in feet, no leg cramps, no varicose veins, no DVT, no non-healing foot ulcer Neuro:no stroke, no TIA's, no seizures, no headaches, no temporary blindness one eye, no slurred speech, no peripheral neuropathy, no chronic pain, no instability of gait, no memory/cognitive dysfunction Musculoskeletal:no arthritis, no joint swelling, + myalgias, no difficulty walking, normal mobility  Skin:no rash, no itching, no skin infections, no pressure sores or ulcerations Psych:+ anxiety, + depression, no nervousness, no unusual recent stress Eyes:+ blurry vision, no floaters, + recent vision changes, + wears glasses or contacts ENT:no hearing loss, no loose or painful teeth, no dentures, last saw dentist 05/2015 Hematologic:no easy bruising, no abnormal bleeding, no clotting disorder, no frequent epistaxis Endocrine:no diabetes, does not check CBG's at home   Physical Exam:  BP 101/64 mmHg  Pulse 90  Resp 20  Ht '5\' 2"'  (1.575 m)  Wt 116 lb (52.617 kg)  BMI 21.21 kg/m2  SpO2 98% General:  well-appearing HEENT:Unremarkable  Neck:no JVD, no bruits, no adenopathy  Chest:clear to auscultation, symmetrical breath sounds, no wheezes, no rhonchi  CV:RRR, faint  systolic murmur axilla Abdomen:soft, non-tender, no masses  Extremities:warm, well-perfused, pulses diminished, no LE edema Rectal/GUDeferred Neuro:Grossly non-focal and symmetrical throughout Skin:Clean and dry, no rashes, no breakdown    Diagnostic Tests:  Transthoracic Echocardiography  Patient:  Kelsye, Loomer MR #:    [id number] Study Date: 06/04/2015 Gender:   F Age:    50 Height:   157.5 cm Weight:   53.5 kg BSA:    1.53 m^2 Pt. Status: Room:  SONOGRAPHER Wyatt Mage, RDCS ATTENDING  Lyman Bishop MD PERFORMING  [facility] ORDERING   Skeet Latch, MD [place], MD  cc:  ------------------------------------------------------------------- LV EF: 45% -  50%  ------------------------------------------------------------------- Indications:   Shortness of breath (R06.02).  ------------------------------------------------------------------- History:  Risk factors: Faintness.  ------------------------------------------------------------------- Study Conclusions  - Left ventricle: The cavity size was normal. Wall thickness was increased in a pattern of mild LVH. Systolic function was mildly reduced. The estimated ejection fraction was in the range of 45% to 50%. Abnormal GLPSS at -11%, with inferior and inferolateral strain abnormality. Basal to mid inferolateral wall hypokinesis. Doppler parameters are  consistent with restrictive left ventricular relaxation (grade 3 diastolic dysfunction). The E/A ratio is >2.5. The E/e&' ratio is >20, suggesting markedly elevated LV filling pressure. - Mitral valve: Mildly thickened leaflets with tethering of the posterior leaflet. There is severe, posteriorly directed regurgitation which hugs the atrial free wall. Reversal of flow is seen in the pulmonary vein. - Left atrium: Severely dilated at 52 ml/m2. - Right atrium: The atrium was mildly dilated. - Tricuspid valve: There was mild regurgitation. - Pulmonary arteries: PA peak pressure: 41 mm Hg (S). - Inferior vena cava: The vessel was normal in size. The respirophasic diameter changes were in the normal range (>= 50%), consistent with normal central venous pressure. - Pericardium, extracardiac: A trivial pericardial effusion was identified posterior to the heart. Features were not consistent with tamponade physiology.  Impressions:  - LVEF 45-50%, mild LVH, abnormal GLPSS at -11% with inferolateral strain and wall motion abnormalities. There is tethering of the posterior mitral leaflet and severe posteriorly directed mitral regurgitation. Restrictive diastolic dysfunction with likely elevated LV filling pressures. Severely dilated LA at 52 ml/m2, mild TR, RVSP elevated at 41 mmHg (consistent with mild pulmonary venous hypertension), trivial posterior pericardial effusion without tamponade physiology.  Transthoracic echocardiography. M-mode, complete 2D, spectral Doppler, and color Doppler. Birthdate: Patient birthdate: March 03, 1961. Age: Patient is 54 yr old. Sex: Gender: female. BMI: 21.6 kg/m^2. Blood pressure:   106/72 Patient status: Outpatient. Study date: Study date: 06/04/2015. Study time: 10:20 AM. Location: [facility]  3  -------------------------------------------------------------------  ------------------------------------------------------------------- Left ventricle: The cavity size was normal. Wall thickness was increased in a pattern of mild LVH. Systolic function was mildly reduced. The estimated ejection fraction was in the range of 45% to 50%. Basal to mid inferolateral wall hypokinesis. Doppler parameters are consistent with restrictive left ventricular relaxation (grade 3 diastolic dysfunction). The E/A ratio is >2.5. The E/e&' ratio is >20, suggesting markedly elevated LV filling pressure.  ------------------------------------------------------------------- Aortic valve:  Structurally normal valve. Trileaflet. Cusp separation was normal. Doppler: Transvalvular velocity was within the normal range. There was no stenosis. There was no regurgitation.  ------------------------------------------------------------------- Aorta: Aortic root: The aortic root was normal in size. Ascending aorta: The ascending aorta was normal in size.  ------------------------------------------------------------------- Mitral valve: Mildly thickened leaflets with tethering of the posterior leaflet. There is severe, posteriorly directed regurgitation which hugs the atrial free wall. Reversal of flow is seen in  the pulmonary vein. Doppler:   Peak gradient (D): 5 mm Hg.  ------------------------------------------------------------------- Left atrium: Severely dilated at 52 ml/m2.  ------------------------------------------------------------------- Right ventricle: The cavity size was normal. Wall thickness was normal. Systolic function was normal.  ------------------------------------------------------------------- Pulmonic valve:  The valve appears to be grossly normal. Doppler: There was trivial regurgitation.  ------------------------------------------------------------------- Tricuspid  valve:  Doppler: There was mild regurgitation.  ------------------------------------------------------------------- Pulmonary artery:  The main pulmonary artery was normal-sized.  ------------------------------------------------------------------- Right atrium: The atrium was mildly dilated.  ------------------------------------------------------------------- Pericardium: A trivial pericardial effusion was identified posterior to the heart. Doppler: Features were not consistent with tamponade physiology.  ------------------------------------------------------------------- Systemic veins: Inferior vena cava: The vessel was normal in size. The respirophasic diameter changes were in the normal range (>= 50%), consistent with normal central venous pressure.  ------------------------------------------------------------------- Measurements  Left ventricle            Value    Reference LV ID, ED, PLAX chordal        44.6 mm   43 - 52 LV ID, ES, PLAX chordal        30.5 mm   23 - 38 LV fx shortening, PLAX chordal    32  %   >=29 LV PW thickness, ED          11.4 mm   --------- IVS/LV PW ratio, ED          1.01     <=1.3 Stroke volume, 2D           49  ml   --------- Stroke volume/bsa, 2D         32  ml/m^2 --------- LV e&', lateral            5.51 cm/s  --------- LV E/e&', lateral           20.69    --------- LV e&', medial             5.12 cm/s  --------- LV E/e&', medial            22.27    --------- LV e&', average            5.32 cm/s  --------- LV E/e&', average           21.45    --------- Longitudinal strain, TDI       11  %   ---------  Ventricular septum          Value    Reference IVS thickness, ED           11.5 mm    ---------  LVOT                 Value    Reference LVOT ID, S              18  mm   --------- LVOT area               2.54 cm^2  --------- LVOT peak velocity, S         82  cm/s  --------- LVOT mean velocity, S         60.1 cm/s  --------- LVOT VTI, S              19.4 cm   ---------  Aorta                 Value    Reference Aortic root ID, ED          27  mm   --------- Ascending aorta ID, A-P, S      30  mm   ---------  Left atrium              Value    Reference LA ID, A-P, ES            44  mm   --------- LA ID/bsa, A-P         (H)  2.87 cm/m^2 <=2.2 LA volume, S             80.1 ml   --------- LA volume/bsa, S           52.2 ml/m^2 --------- LA volume, ES, 1-p A4C        82.1 ml   --------- LA volume/bsa, ES, 1-p A4C      53.6 ml/m^2 --------- LA volume, ES, 1-p A2C        72.7 ml   --------- LA volume/bsa, ES, 1-p A2C      47.4 ml/m^2 ---------  Mitral valve             Value    Reference Mitral E-wave peak velocity      114  cm/s  --------- Mitral A-wave peak velocity      40.7 cm/s  --------- Mitral deceleration time    (L)  140  ms   150 - 230 Mitral peak gradient, D        5   mm Hg --------- Mitral E/A ratio, peak        2.8     --------- Mitral regurg VTI, PISA        139  cm   --------- Mitral ERO, PISA           0.36 cm^2  --------- Mitral regurg volume, PISA      50  ml   ---------  Pulmonary arteries          Value    Reference PA pressure, S, DP       (H)  41  mm Hg <=30  Systemic veins            Value    Reference Estimated CVP              3   mm Hg ---------  Right ventricle            Value    Reference RV s&', lateral, S           10.2 cm/s  ---------  Legend: (L) and (H) mark values outside specified reference range.  ------------------------------------------------------------------- Prepared and Electronically Authenticated by  Lyman Bishop MD 2016-11-16T15:59:27    Transesophageal Echocardiography  Patient:  Cherilyn, Sautter MR #:    [id number] Study Date: 07/07/2015 Gender:   F Age:    81 Height:   157.5 cm Weight:   52.7 kg BSA:    1.52 m^2 Pt. Status: Room:  SONOGRAPHER Tresa Res, RDCS ADMITTING  Skeet Latch, MD [place], MD [place], MD PERFORMING  Skeet Latch, MD [place], MD  cc:  ------------------------------------------------------------------- LV EF: 50% -  55%  ------------------------------------------------------------------- Indications:   Mitral regurgitation 424.0.  ------------------------------------------------------------------- Study Conclusions  - Left ventricle: Systolic function was normal. The estimated ejection fraction was in the range of 50% to 55%. Wall motion was normal; there were no regional wall motion abnormalities. - Aortic valve: There was trivial regurgitation. - Mitral valve: Mobility of the posterior leaflet  was mildly restricted. There was moderate regurgitation originating at the P3 segment. - Left atrium: The atrium was dilated. No evidence of thrombus in the atrial cavity or appendage. No evidence of thrombus in the atrial cavity or appendage. - Left upper pulmonary vein: There was no systolic flow reversal. - Right ventricle: The cavity size was normal. Wall thickness was normal. Systolic function was normal. - Right atrium: No evidence of thrombus in the atrial cavity or appendage. -  Atrial septum: No defect or patent foramen ovale was identified. - Inferior vena cava: Eustacian valve noted. - Pericardium, extracardiac: A trivial pericardial effusion was identified.  Diagnostic transesophageal echocardiography. 2D and color Doppler. Birthdate: Patient birthdate: [birth year]. Age: Patient is 54 yr old. Sex: Gender: female.  BMI: 21.3 kg/m^2. Blood pressure: 106/82 Patient status: Outpatient. Study date: Study date: 07/07/2015. Study time: 12:54 PM. Location: [facility].  -------------------------------------------------------------------  ------------------------------------------------------------------- Left ventricle: Systolic function was normal. The estimated ejection fraction was in the range of 50% to 55%. Wall motion was normal; there were no regional wall motion abnormalities.  ------------------------------------------------------------------- Aortic valve:  Structurally normal valve. Trileaflet; normal thickness leaflets. Cusp separation was normal. Doppler:  There was no stenosis.  There was trivial regurgitation.  ------------------------------------------------------------------- Aorta: There was no atheroma. There was no evidence for dissection. Aortic root: The aortic root was not dilated. Ascending aorta: The ascending aorta was normal in size. Aortic arch: The aortic arch was normal in size. Descending aorta: The descending aorta was normal in size.  ------------------------------------------------------------------- Mitral valve: Leaflet separation was normal. Mobility of the posterior leaflet was mildly restricted. Doppler: There was moderate regurgitation originating at the P3 segment.  ------------------------------------------------------------------- Left atrium: The atrium was dilated. No evidence of thrombus in the atrial cavity or appendage. No evidence of thrombus in the atrial cavity or appendage. The  appendage was morphologically a left appendage, multilobulated, and of normal size. Emptying velocity was normal.  ------------------------------------------------------------------- Atrial septum: No defect or patent foramen ovale was identified.  ------------------------------------------------------------------- Pulmonary veins: Left upper pulmonary vein: There was no systolic flow reversal.  ------------------------------------------------------------------- Right ventricle: The cavity size was normal. Wall thickness was normal. Systolic function was normal.  ------------------------------------------------------------------- Pulmonic valve:  Structurally normal valve.  Doppler: There was trivial regurgitation.  ------------------------------------------------------------------- Tricuspid valve:  Structurally normal valve.  Leaflet separation was normal. Doppler: There was mild regurgitation.  ------------------------------------------------------------------- Pulmonary artery:  The main pulmonary artery was normal-sized.  ------------------------------------------------------------------- Right atrium: The atrium was normal in size. No evidence of thrombus in the atrial cavity or appendage. The appendage was morphologically a right appendage.  ------------------------------------------------------------------- Pericardium: A trivial pericardial effusion was identified.  ------------------------------------------------------------------- Systemic veins: Inferior vena cava: Eustacian valve noted.  ------------------------------------------------------------------- Measurements  LVOT                 Value LVOT ID, S              17.2 mm LVOT area              2.32 cm^2 LVOT peak velocity, S        79.59 cm/s LVOT VTI, S             14.57 cm Stroke volume (SV), LVOT DP     33.9  ml Stroke index (SV/bsa), LVOT DP    22.3 ml/m^2  Mitral valve             Value Mitral annulus diameter, A-P     28.5 mm Mitral regurg VTI, PISA  147  cm Mitral ERO, PISA           0.34 cm^2 Mitral regurg volume, PISA      50  ml Mitral regurg fraction, PISA     59.63 %  Tricuspid valve           Value Tricuspid regurg peak velocity    269  cm/s Tricuspid peak RV-RA gradient    29  mm Hg  Legend: (L) and (H) mark values outside specified reference range.  ------------------------------------------------------------------- Prepared and Electronically Authenticated by  Skeet Latch, MD 2016-12-19T14:42:59       CARDIAC CATHETERIZATION Procedures    Right/Left Heart Cath and Coronary Angiography    Conclusion     Severe mitral regurgitation with large V waves and 4+ MR  There is mild to moderate left ventricular systolic dysfunction. With cardiac output/index moderate-severely reduced.  Angiographically normal coronary arteries   Angiographic and Right Heart Cath confirmed severe MR with normal PA pressures. Consistently reduced EF by LV gram as compared to echocardiogram. Cardiac output also confirms at least moderately reduced function.   Plan: Standard post radial/brachial cath care with sheath removal. Expected discharge following bed rest  Patient will follow-up with Dr. Oval Linsey plans to be referred to Dr. Roxy Manns for scheduling of MVR.   Leonie Man, M.D., M.S. Interventional Cardiologist   Pager # [phone]      Indications    Severe mitral regurgitation [I34.0 (ICD-10-CM)]   Preoperative testing [N82.956 (ICD-10-CM)]    Technique and Indications    PCP: Lilian Coma, MD Cardiologist: Sharol Harness, MD   Ms. Brush is a very pleasant 54 year old woman who was undergoing evaluation for syncope.  She underwent echocardiography that revealed severe mitral regurgitation and a reduced LVEF (45-50%). The MR was due to tethering of the posterior leaflet. She was also noted to have grade 3 diastolic dysfunction with mid basal to mid inferolateral wall hypokinesis. MR was noted on TEE as well. She is now referred for right and left heart catheterization for part of her preoperative evaluation.  PROCEDURE: Estimated blood loss <50 mL. There were no immediate complications during the procedure. Time Out: Verified patient identification, verified procedure, site/side was marked, verified correct patient position, special equipment/implants available, medications/allergies/relevent history reviewed, required imaging and test results available. Performed.  The patient was sedated with 25 g IV fentanyl, 1 mgIV Versed 500 mL NS Bolus for cocktail related hypotension.  Access:  RIGHT Radial Artery: 6 Fr sheath -- Seldinger technique using Angiocath Micropuncture Kit * 10 mL radial cocktail IA; Radial Cocktail: 5 mg Verapamil, 400 mcg NTG, 2 ml 2% Lidocaine in 10 ml NS * 3000 Units IV Heparin Right Brachial/Antecubital Vein: The existing 18-gauge IV was exchanged over a wire for a 5Fr short sheath  Right Heart Catheterization: 5 Fr Gordy Councilman catheter advanced under fluoroscopy with balloon inflated to the RA, RV, then PCWP-PA for hemodynamic measurement.  Simultaneous FA & PA blood gases checked for SaO2% to calculate FICK CO/CI  Catheter removed completely out of the body with balloon deflated.  Left Heart Catheterization: 5Fr Catheters advanced or exchanged over a J-wire under direct fluoroscopic guidance into the ascending aorta; TIG 4.0 catheter advanced first.  LV Hemodynamics (LV Gram): Angled Pigtail Catheter Left Coronary Artery Cineangiography: TIG 4.0 Catheter  Right Coronary Artery Cineangiography: TIG 4.0 Catheter   Brachial Sheath(s) removed in the PACU Holding Area with manual  pressure for hemostasis.   Radial sheath removed in the Cardiac Catheterization lab  with TR Band placed for hemostasis.  TR Band: 1615 Hours; 11 mL air    Coronary Findings    Dominance: Right   Left Main  . Vessel is large. Vessel is angiographically normal.     Left Anterior Descending  . Vessel is large. Vessel is angiographically normal. Tapers to moderate and small caliber vessel as it wraps the apex   . First Diagonal Branch   The vessel is moderate in size.   . Lateral First Diagonal Branch   The vessel is small in size.   . First Septal Branch   The vessel is moderate in size.   Marland Kitchen Second Septal Branch   The vessel is small in size.   . Third Septal Branch   The vessel is small in size.     Ramus Intermedius  . Vessel is small.     Left Circumflex  . Vessel is angiographically normal. Almost codominant vessel   . First Obtuse Marginal Branch   The vessel is large in size and is angiographically normal. The vessel is tortuous.   . Second Obtuse Marginal Branch   The vessel is moderate in size.   . Lateral Second Obtuse Marginal Branch   The vessel is small in size.     Right Coronary Artery  . Vessel is large. Vessel is angiographically normal.   . Acute Marginal Branch   The vessel is small in size.   . Right Posterior Descending Artery   The vessel is moderate in size and is angiographically normal.   . Right Posterior Atrioventricular Branch   The vessel is small in size.   . First Right Posterolateral   The vessel is angiographically normal.       Right Heart Pressures Hemodynamic findings consistent with mitral valve regurgitation. LV EDP is normal. Significant V wave on PCWP waveform. 3-4+ MR on LV Gram  FICK CO/CI: 3.49 / 2.3 -- moderate-severely reduced. PVR 160 D/S (244 D/DI); TPVR: 527 D/C / 801 D/DI SVR 1329 D/X (2020 D/DI); TSVR 1397 D/S/ (2124 D/DI))     Wall Motion                 Left Heart    Left Ventricle The left ventricular size is normal. There is mild to moderate left ventricular systolic dysfunction. The left ventricular ejection fraction is 35-45% by visual estimate. There are wall motion abnormalities in the left ventricle. There is global hypokinesis of the left ventricle. EF ~40-45%   Mitral Valve There is no mitral valve stenosis, severe (4+) mitral regurgitation and mitral valve prolapse present. Posterior leaflet.   Aortic Valve There is no aortic valve stenosis.   Aorta The size of the ascending aorta is normal.    Coronary Diagrams    Diagnostic Diagram                CT ANGIOGRAPHY CHEST, ABDOMEN AND PELVIS  TECHNIQUE: Multidetector CT imaging through the chest, abdomen and pelvis was performed using the standard protocol during bolus administration of intravenous contrast. Multiplanar reconstructed images and MIPs were obtained and reviewed to evaluate the vascular anatomy.  CONTRAST: 75 mL of Isovue 370 intravenously.  COMPARISON: None.  FINDINGS: CTA CHEST FINDINGS  No pneumothorax or pleural effusion is noted. No acute pulmonary disease is noted. There is no evidence of thoracic aortic dissection or aneurysm. Great vessels are widely patent. The patient does appear to have a patent ductus arteriosus with jet seen extending into the main pulmonary  artery. There is noted enlargement of the left atrium and left atrial appendage. No significant osseous abnormality is noted. No mediastinal mass or adenopathy is noted.  Review of the MIP images confirms the above findings.  CTA ABDOMEN AND PELVIS FINDINGS  No gallstones are noted. The liver, spleen and pancreas are unremarkable. Adrenal glands and kidneys appear normal. No hydronephrosis or renal obstruction is noted. There is no evidence of abdominal aortic aneurysm or dissection. Mesenteric  and renal arteries are widely patent. There is no evidence of bowel obstruction. The appendix appears normal. Iliac arteries are widely patent without significant stenosis. Urinary bladder appears normal. Uterus appears normal. Small amount of free fluid is noted in the pelvis which most likely is physiologic. No significant adenopathy is noted. No significant osseous abnormality is noted in the abdomen or pelvis.  Review of the MIP images confirms the above findings.  IMPRESSION: No evidence of thoracic or abdominal aortic aneurysm or dissection.  Enlargement of the left atrium and atrial appendage is noted consistent with mitral valve regurgitation.  Patent ductus arteriosus is noted. These results will be called to the ordering clinician or representative by the Radiologist Assistant, and communication documented in the PACS or zVision Dashboard.   Electronically Signed  By: Marijo Conception, M.D.  On: 07/16/2015 15:33           Vitals     Height Weight BMI (Calculated)    '5\' 2"'  (1.575 m) 52.572 kg (115 lb 14.4 oz) 21.2      Interpretation Summary     CLINICAL DATA: Shortness of breath, cough, preop for mitral valve replacement.  EXAM: CT ANGIOGRAPHY CHEST, ABDOMEN AND PELVIS  TECHNIQUE: Multidetector CT imaging through the chest, abdomen and pelvis was performed using the standard protocol during bolus administration of intravenous contrast. Multiplanar reconstructed images and MIPs were obtained and reviewed to evaluate the vascular anatomy.  CONTRAST: 75 mL of Isovue 370 intravenously.  COMPARISON: None.  FINDINGS: CTA CHEST FINDINGS  No pneumothorax or pleural effusion is noted. No acute pulmonary disease is noted. There is no evidence of thoracic aortic dissection or aneurysm. Great vessels are widely patent. The patient does appear to have a patent ductus arteriosus with jet seen extending into the main pulmonary artery.  There is noted enlargement of the left atrium and left atrial appendage. No significant osseous abnormality is noted. No mediastinal mass or adenopathy is noted.  Review of the MIP images confirms the above findings.  CTA ABDOMEN AND PELVIS FINDINGS  No gallstones are noted. The liver, spleen and pancreas are unremarkable. Adrenal glands and kidneys appear normal. No hydronephrosis or renal obstruction is noted. There is no evidence of abdominal aortic aneurysm or dissection. Mesenteric and renal arteries are widely patent. There is no evidence of bowel obstruction. The appendix appears normal. Iliac arteries are widely patent without significant stenosis. Urinary bladder appears normal. Uterus appears normal. Small amount of free fluid is noted in the pelvis which most likely is physiologic. No significant adenopathy is noted. No significant osseous abnormality is noted in the abdomen or pelvis.  Review of the MIP images confirms the above findings.  IMPRESSION: No evidence of thoracic or abdominal aortic aneurysm or dissection.  Enlargement of the left atrium and atrial appendage is noted consistent with mitral valve regurgitation.  Patent ductus arteriosus is noted. These results will be called to the ordering clinician or representative by the Radiologist Assistant, and communication documented in the PACS or zVision Dashboard.   Electronically  Signed  By: Marijo Conception, M.D.  On: 07/16/2015 15:33      Impression:  Patient has stage D severe symptomatic primary mitral regurgitation. She presents with progressive symptoms of exertional shortness of breath, chest tightness, dizzy spells, and 2 recent syncopal episodes. She also has a persistent dry nonproductive cough. I have personally reviewed the patient's recent transthoracic and transesophageal echocardiograms. The patient's symptoms seem to be disproportionate to the severity of regurgitation  demonstrated on recent echocardiograms and her murmur is not very impressive on exam. However, the patient clearly has severe mitral regurgitation and the degree of LV systolic dysfunction and left atrial enlargement suggest this is chronic. Both echocardiograms demonstrate the presence of at least mild to moderate global left ventricular systolic dysfunction and some restriction of the posterior leaflet of the mitral valve. However, features are not suggestive of rheumatic disease. 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 Diagnostic cardiac catheterization is notable for the absence of significant coronary artery disease and confirms the presence of severe mitral regurgitation. I agree the patient would best be treated with mitral valve repair. Based upon review of the patient's transthoracic and transesophageal echocardiograms I remain hopeful that her valve should be, although the specific etiology of her valvular disease remains unclear at this time. The patient may be a good candidate for minimally invasive approach for surgery.   Plan:  The patient was counseled at length regarding the indications, risks and potential benefits of mitral valve repair. The rationale for elective surgery has been explained, including a comparison between surgery and continued medical therapy with close follow-up. The likelihood of successful and durable valve repair has been discussed with particular reference to the findings of their recent echocardiogram. Based upon these findings and previous experience, I have quoted them a greater than 90 percent likelihood of successful valve repair. In the unlikely event that their valve cannot be successfully repaired, we discussed the possibility of replacing the mitral valve using a mechanical prosthesis with the attendant need for long-term anticoagulation versus the alternative of replacing it using a  bioprosthetic tissue valve with its potential for late structural valve deterioration and failure, depending upon the patient's longevity. The patient specifically requests that if the mitral valve must be replaced that it be done using a bioprosthetic tissue valve. She does not feel that she could take any type of medication in the long-term, especially warfarin. The patient understands and accepts all potential risks of surgery including but not limited to risk of death, stroke or other neurologic complication, myocardial infarction, congestive heart failure, respiratory failure, renal failure, bleeding requiring transfusion and/or reexploration, arrhythmia, infection or other wound complications, pneumonia, pleural and/or pericardial effusion, pulmonary embolus, aortic dissection or other major vascular complication, or delayed complications related to valve repair or replacement including but not limited to structural valve deterioration and failure, thrombosis, embolization, endocarditis, or paravalvular leak. Alternative surgical approaches have been discussed including a comparison between conventional sternotomy and minimally-invasive techniques. The relative risks and benefits of each have been reviewed as they pertain to the patient's specific circumstances, and all of their questions have been addressed. Specific risks potentially related to the minimally-invasive approach were discussed at length, including but not limited to risk of conversion to full or partial sternotomy, aortic dissection or other major vascular complication, unilateral acute lung injury or pulmonary edema, phrenic nerve dysfunction or paralysis, rib fracture, chronic pain, lung hernia, or lymphocele. All of her questions have been  answered.  We plan to proceed with surgery on Tuesday, 07/22/2015. The patient will undergo CT angiography to evaluate the feasibility of peripheral cannulation for surgery.    Valentina Gu.  Roxy Manns, MD 07/10/2015 5:52 PM    Addendum:  CT angiography demonstrates the presence of a patent ductus arteriosus.  This unusual finding helps explain the patient's disproportionate symptoms and recent syncopal episodes.  The PDA will need to be closed at the time of surgery.  This will require a conventional median sternotomy rather than minimally-invasive approach.  This has been discussed with the patient and she agrees with the plan as outlined.   Valentina Gu. Roxy Manns, MD 07/17/2015 10:55 AM

## 2015-07-17 NOTE — Progress Notes (Addendum)
Patient had PFT's in March, which is OK with Dr. Cornelius Moras. Dopplers were obtained this am. Cardio is Dr. Chilton Si Left message to Darius Bump, to look at patient's UA results.

## 2015-07-17 NOTE — Pre-Procedure Instructions (Addendum)
Diane [name]  07/17/2015      [id number] - [place], [place] - [address] AT [facility] OF [address] [place] [address] Phone: [phone] Fax: [phone]  [facility] [address] [place], [place] - [address] AT [facility] OF [address] & [facility] [address] [place] [address] Phone: [phone] Fax: [phone]    Your procedure is scheduled on Tuesday, Jan. 3rd   Report to [facility] Admitting at 5:30 AM             (Surgery time is 7:30 - 2:00 PM)   Call this number if you have problems the morning of surgery:  [phone]   Remember:  Do not eat food or drink liquids after midnight Monday.  Take these medicines the morning of surgery with A SIP OF WATER : NONE   Do not wear jewelry, make-up or nail polish.  Do not wear lotions, powders, or perfumes.  You may wear deodorant.  Do not shave underarms & legs 48 hours prior to surgery.     Do not bring valuables to the hospital.  [facility] is not responsible for any belongings or valuables.  Contacts, dentures or bridgework may not be worn into surgery.  Leave your suitcase in the car.  After surgery it may be brought to your room. For patients admitted to the hospital, discharge time will be determined by your treatment team.  Name and phone number of your driver:   CHANDRA  CHANG - DAUGHTER  Please read over the following fact sheets that you were given. Pain Booklet, Coughing and Deep Breathing, Blood Transfusion Information, MRSA Information and Surgical Site Infection Prevention

## 2015-07-18 LAB — HEMOGLOBIN A1C
Hgb A1c MFr Bld: 5.8 % — ABNORMAL HIGH (ref 4.8–5.6)
Mean Plasma Glucose: 120 mg/dL

## 2015-07-21 ENCOUNTER — Encounter (HOSPITAL_COMMUNITY): Payer: Self-pay | Admitting: Certified Registered Nurse Anesthetist

## 2015-07-21 MED ORDER — NITROGLYCERIN IN D5W 200-5 MCG/ML-% IV SOLN
2.0000 ug/min | INTRAVENOUS | Status: DC
Start: 2015-07-22 — End: 2015-07-22
  Filled 2015-07-21: qty 250

## 2015-07-21 MED ORDER — VANCOMYCIN HCL 1000 MG IV SOLR
INTRAVENOUS | Status: AC
  Administered 2015-07-22: 1000 mL
  Filled 2015-07-21: qty 1000

## 2015-07-21 MED ORDER — DOPAMINE-DEXTROSE 3.2-5 MG/ML-% IV SOLN
0.0000 ug/kg/min | INTRAVENOUS | Status: DC
  Filled 2015-07-21: qty 250

## 2015-07-21 MED ORDER — PHENYLEPHRINE HCL 10 MG/ML IJ SOLN
30.0000 ug/min | INTRAVENOUS | Status: DC
  Filled 2015-07-21: qty 2

## 2015-07-21 MED ORDER — CHLORHEXIDINE GLUCONATE 0.12 % MT SOLN: 15.0000 mL | Freq: Once | OROMUCOSAL | Status: DC

## 2015-07-21 MED ORDER — VANCOMYCIN HCL IN DEXTROSE 1-5 GM/200ML-% IV SOLN
1000.0000 mg | INTRAVENOUS | Status: AC
  Administered 2015-07-22: 1000 mg via INTRAVENOUS
  Filled 2015-07-21: qty 200

## 2015-07-21 MED ORDER — PAPAVERINE HCL 30 MG/ML IJ SOLN
INTRAMUSCULAR | Status: DC
  Filled 2015-07-21: qty 2.5

## 2015-07-21 MED ORDER — SODIUM CHLORIDE 0.9 % IV SOLN
INTRAVENOUS | Status: DC
  Filled 2015-07-21: qty 30

## 2015-07-21 MED ORDER — POTASSIUM CHLORIDE 2 MEQ/ML IV SOLN
80.0000 meq | INTRAVENOUS | Status: DC
  Filled 2015-07-21: qty 40

## 2015-07-21 MED ORDER — SODIUM CHLORIDE 0.9 % IV SOLN
INTRAVENOUS | Status: AC
  Administered 2015-07-22: 5 mL/h via INTRAVENOUS
  Filled 2015-07-21: qty 40

## 2015-07-21 MED ORDER — DEXTROSE 5 % IV SOLN
750.0000 mg | INTRAVENOUS | Status: DC
  Filled 2015-07-21: qty 750

## 2015-07-21 MED ORDER — DEXMEDETOMIDINE HCL IN NACL 400 MCG/100ML IV SOLN
0.1000 ug/kg/h | INTRAVENOUS | Status: AC
  Administered 2015-07-22: .3 ug/kg/h via INTRAVENOUS
  Filled 2015-07-21: qty 100

## 2015-07-21 MED ORDER — EPINEPHRINE HCL 1 MG/ML IJ SOLN
0.0000 ug/min | INTRAVENOUS | Status: DC
  Filled 2015-07-21: qty 4

## 2015-07-21 MED ORDER — DEXTROSE 5 % IV SOLN
1.5000 g | INTRAVENOUS | Status: AC
  Administered 2015-07-22: .75 g via INTRAVENOUS
  Administered 2015-07-22: 1.5 g via INTRAVENOUS
  Filled 2015-07-21: qty 1.5

## 2015-07-21 MED ORDER — SODIUM CHLORIDE 0.9 % IV SOLN
INTRAVENOUS | Status: AC
  Administered 2015-07-22: .7 [IU]/h via INTRAVENOUS
  Filled 2015-07-21: qty 2.5

## 2015-07-21 MED ORDER — CHLORHEXIDINE GLUCONATE 4 % EX LIQD: 30.0000 mL | CUTANEOUS | Status: DC

## 2015-07-21 MED ORDER — MAGNESIUM SULFATE 50 % IJ SOLN
40.0000 meq | INTRAMUSCULAR | Status: DC
  Filled 2015-07-21: qty 10

## 2015-07-21 MED ORDER — METOPROLOL TARTRATE 12.5 MG HALF TABLET
12.5000 mg | ORAL_TABLET | Freq: Once | ORAL | Status: AC
  Administered 2015-07-22: 12.5 mg via ORAL
  Filled 2015-07-21: qty 1

## 2015-07-21 MED ORDER — GLUTARALDEHYDE 0.625% SOAKING SOLUTION
TOPICAL | Status: DC | PRN
  Administered 2015-07-22: 1 via TOPICAL
  Filled 2015-07-21: qty 50

## 2015-07-22 ENCOUNTER — Inpatient Hospital Stay (HOSPITAL_COMMUNITY): Payer: BLUE CROSS/BLUE SHIELD | Admitting: Certified Registered Nurse Anesthetist

## 2015-07-22 ENCOUNTER — Inpatient Hospital Stay (HOSPITAL_COMMUNITY)
Admission: RE | Admit: 2015-07-22 | Discharge: 2015-07-22 | Disposition: A | Discharge status: Home / Self Care | Payer: BLUE CROSS/BLUE SHIELD | Source: Ambulatory Visit | Attending: Thoracic Surgery (Cardiothoracic Vascular Surgery) | Admitting: Thoracic Surgery (Cardiothoracic Vascular Surgery)

## 2015-07-22 ENCOUNTER — Inpatient Hospital Stay (HOSPITAL_COMMUNITY): Payer: BLUE CROSS/BLUE SHIELD

## 2015-07-22 ENCOUNTER — Encounter (HOSPITAL_COMMUNITY): Payer: Self-pay | Admitting: Thoracic Surgery (Cardiothoracic Vascular Surgery)

## 2015-07-22 ENCOUNTER — Encounter (HOSPITAL_COMMUNITY)
Admission: RE | Disposition: A | Discharge status: Home Health | Payer: BLUE CROSS/BLUE SHIELD | Source: Ambulatory Visit | Attending: Thoracic Surgery (Cardiothoracic Vascular Surgery)

## 2015-07-22 ENCOUNTER — Inpatient Hospital Stay (HOSPITAL_COMMUNITY)
Admission: RE | Admit: 2015-07-22 | Discharge: 2015-07-28 | DRG: 220 | Disposition: A | Discharge status: Home Health | Payer: BLUE CROSS/BLUE SHIELD | Source: Ambulatory Visit | Attending: Thoracic Surgery (Cardiothoracic Vascular Surgery) | Admitting: Thoracic Surgery (Cardiothoracic Vascular Surgery)

## 2015-07-22 DIAGNOSIS — Z79899 Other long term (current) drug therapy: Secondary | ICD-10-CM | POA: Diagnosis not present

## 2015-07-22 DIAGNOSIS — D6959 Other secondary thrombocytopenia: Secondary | ICD-10-CM | POA: Diagnosis present

## 2015-07-22 DIAGNOSIS — I5042 Chronic combined systolic (congestive) and diastolic (congestive) heart failure: Secondary | ICD-10-CM | POA: Diagnosis present

## 2015-07-22 DIAGNOSIS — I340 Nonrheumatic mitral (valve) insufficiency: Secondary | ICD-10-CM | POA: Diagnosis present

## 2015-07-22 DIAGNOSIS — I342 Nonrheumatic mitral (valve) stenosis: Secondary | ICD-10-CM | POA: Diagnosis not present

## 2015-07-22 DIAGNOSIS — Z9889 Other specified postprocedural states: Secondary | ICD-10-CM

## 2015-07-22 DIAGNOSIS — Q250 Patent ductus arteriosus: Secondary | ICD-10-CM

## 2015-07-22 DIAGNOSIS — D62 Acute posthemorrhagic anemia: Secondary | ICD-10-CM | POA: Diagnosis not present

## 2015-07-22 DIAGNOSIS — R001 Bradycardia, unspecified: Secondary | ICD-10-CM | POA: Diagnosis not present

## 2015-07-22 DIAGNOSIS — R0602 Shortness of breath: Secondary | ICD-10-CM | POA: Diagnosis present

## 2015-07-22 DIAGNOSIS — I719 Aortic aneurysm of unspecified site, without rupture: Secondary | ICD-10-CM | POA: Diagnosis present

## 2015-07-22 DIAGNOSIS — J9811 Atelectasis: Secondary | ICD-10-CM

## 2015-07-22 DIAGNOSIS — I441 Atrioventricular block, second degree: Secondary | ICD-10-CM | POA: Diagnosis not present

## 2015-07-22 HISTORY — PX: CLIPPING OF ATRIAL APPENDAGE: SHX5773

## 2015-07-22 HISTORY — DX: Other specified postprocedural states: Z98.890

## 2015-07-22 HISTORY — PX: MITRAL VALVE REPAIR: SHX2039

## 2015-07-22 HISTORY — DX: Patent ductus arteriosus: Q25.0

## 2015-07-22 HISTORY — PX: TEE WITHOUT CARDIOVERSION: SHX5443

## 2015-07-22 HISTORY — PX: PATENT DUCTUS ARTERIOUS REPAIR: SHX269

## 2015-07-22 LAB — CBC
HEMATOCRIT: 28.5 % — AB (ref 36.0–46.0)
HEMATOCRIT: 24.4 % — AB (ref 36.0–46.0)
HEMOGLOBIN: 9.8 g/dL — AB (ref 12.0–15.0)
Hemoglobin: 8 g/dL — ABNORMAL LOW (ref 12.0–15.0)
MCH: 32.2 pg (ref 26.0–34.0)
MCH: 31.4 pg (ref 26.0–34.0)
MCHC: 34.4 g/dL (ref 30.0–36.0)
MCHC: 32.8 g/dL (ref 30.0–36.0)
MCV: 93.8 fL (ref 78.0–100.0)
MCV: 95.7 fL (ref 78.0–100.0)
PLATELETS: 111 10*3/uL — AB (ref 150–400)
Platelets: 116 10*3/uL — ABNORMAL LOW (ref 150–400)
RBC: 3.04 MIL/uL — ABNORMAL LOW (ref 3.87–5.11)
RBC: 2.55 MIL/uL — AB (ref 3.87–5.11)
RDW: 12.3 % (ref 11.5–15.5)
RDW: 12.4 % (ref 11.5–15.5)
WBC: 16.6 10*3/uL — ABNORMAL HIGH (ref 4.0–10.5)
WBC: 12 10*3/uL — AB (ref 4.0–10.5)

## 2015-07-22 LAB — POCT I-STAT, CHEM 8
BUN: 12 mg/dL (ref 6–20)
BUN: 10 mg/dL (ref 6–20)
BUN: 9 mg/dL (ref 6–20)
BUN: 10 mg/dL (ref 6–20)
BUN: 10 mg/dL (ref 6–20)
BUN: 10 mg/dL (ref 6–20)
CALCIUM ION: 1.2 mmol/L (ref 1.12–1.23)
CALCIUM ION: 1.07 mmol/L — AB (ref 1.12–1.23)
CALCIUM ION: 1.18 mmol/L (ref 1.12–1.23)
CHLORIDE: 103 mmol/L (ref 101–111)
CHLORIDE: 105 mmol/L (ref 101–111)
CHLORIDE: 105 mmol/L (ref 101–111)
CHLORIDE: 108 mmol/L (ref 101–111)
Calcium, Ion: 1.21 mmol/L (ref 1.12–1.23)
Calcium, Ion: 1.14 mmol/L (ref 1.12–1.23)
Calcium, Ion: 1.11 mmol/L — ABNORMAL LOW (ref 1.12–1.23)
Chloride: 104 mmol/L (ref 101–111)
Chloride: 103 mmol/L (ref 101–111)
Creatinine, Ser: <0.2 mg/dL — ABNORMAL LOW (ref 0.44–1.00)
Creatinine, Ser: <0.2 mg/dL — ABNORMAL LOW (ref 0.44–1.00)
Creatinine, Ser: 0.3 mg/dL — ABNORMAL LOW (ref 0.44–1.00)
Creatinine, Ser: 0.2 mg/dL — ABNORMAL LOW (ref 0.44–1.00)
Creatinine, Ser: 0.3 mg/dL — ABNORMAL LOW (ref 0.44–1.00)
GLUCOSE: 99 mg/dL (ref 65–99)
GLUCOSE: 160 mg/dL — AB (ref 65–99)
Glucose, Bld: 96 mg/dL (ref 65–99)
Glucose, Bld: 121 mg/dL — ABNORMAL HIGH (ref 65–99)
Glucose, Bld: 122 mg/dL — ABNORMAL HIGH (ref 65–99)
Glucose, Bld: 112 mg/dL — ABNORMAL HIGH (ref 65–99)
HCT: 26 % — ABNORMAL LOW (ref 36.0–46.0)
HCT: 24 % — ABNORMAL LOW (ref 36.0–46.0)
HCT: 23 % — ABNORMAL LOW (ref 36.0–46.0)
HEMATOCRIT: 35 % — AB (ref 36.0–46.0)
HEMATOCRIT: 32 % — AB (ref 36.0–46.0)
HEMATOCRIT: 25 % — AB (ref 36.0–46.0)
HEMOGLOBIN: 11.9 g/dL — AB (ref 12.0–15.0)
HEMOGLOBIN: 10.9 g/dL — AB (ref 12.0–15.0)
HEMOGLOBIN: 8.8 g/dL — AB (ref 12.0–15.0)
HEMOGLOBIN: 8.5 g/dL — AB (ref 12.0–15.0)
Hemoglobin: 8.2 g/dL — ABNORMAL LOW (ref 12.0–15.0)
Hemoglobin: 7.8 g/dL — ABNORMAL LOW (ref 12.0–15.0)
POTASSIUM: 3.6 mmol/L (ref 3.5–5.1)
POTASSIUM: 3.8 mmol/L (ref 3.5–5.1)
POTASSIUM: 4.5 mmol/L (ref 3.5–5.1)
POTASSIUM: 4.3 mmol/L (ref 3.5–5.1)
Potassium: 3.8 mmol/L (ref 3.5–5.1)
Potassium: 4.8 mmol/L (ref 3.5–5.1)
SODIUM: 141 mmol/L (ref 135–145)
SODIUM: 140 mmol/L (ref 135–145)
SODIUM: 142 mmol/L (ref 135–145)
SODIUM: 141 mmol/L (ref 135–145)
Sodium: 142 mmol/L (ref 135–145)
Sodium: 140 mmol/L (ref 135–145)
TCO2: 28 mmol/L (ref 0–100)
TCO2: 30 mmol/L (ref 0–100)
TCO2: 29 mmol/L (ref 0–100)
TCO2: 29 mmol/L (ref 0–100)
TCO2: 28 mmol/L (ref 0–100)
TCO2: 23 mmol/L (ref 0–100)

## 2015-07-22 LAB — POCT I-STAT 3, ART BLOOD GAS (G3+)
ACID-BASE EXCESS: 1 mmol/L (ref 0.0–2.0)
Acid-Base Excess: 3 mmol/L — ABNORMAL HIGH (ref 0.0–2.0)
Acid-Base Excess: 3 mmol/L — ABNORMAL HIGH (ref 0.0–2.0)
Acid-base deficit: 2 mmol/L (ref 0.0–2.0)
Acid-base deficit: 4 mmol/L — ABNORMAL HIGH (ref 0.0–2.0)
Acid-base deficit: 5 mmol/L — ABNORMAL HIGH (ref 0.0–2.0)
BICARBONATE: 27 meq/L — AB (ref 20.0–24.0)
BICARBONATE: 26.2 meq/L — AB (ref 20.0–24.0)
Bicarbonate: 23.7 mEq/L (ref 20.0–24.0)
Bicarbonate: 23.8 mEq/L (ref 20.0–24.0)
Bicarbonate: 23.3 mEq/L (ref 20.0–24.0)
Bicarbonate: 21.4 mEq/L (ref 20.0–24.0)
O2 SAT: 99 %
O2 SAT: 99 %
O2 SAT: 99 %
O2 Saturation: 100 %
O2 Saturation: 100 %
O2 Saturation: 99 %
PCO2 ART: 35.9 mmHg (ref 35.0–45.0)
PCO2 ART: 34.3 mmHg — AB (ref 35.0–45.0)
PCO2 ART: 43.4 mmHg (ref 35.0–45.0)
PCO2 ART: 51.4 mmHg — AB (ref 35.0–45.0)
PCO2 ART: 45.6 mmHg — AB (ref 35.0–45.0)
PH ART: 7.484 — AB (ref 7.350–7.450)
PH ART: 7.491 — AB (ref 7.350–7.450)
PH ART: 7.504 — AB (ref 7.350–7.450)
PO2 ART: 388 mmHg — AB (ref 80.0–100.0)
PO2 ART: 144 mmHg — AB (ref 80.0–100.0)
PO2 ART: 166 mmHg — AB (ref 80.0–100.0)
PO2 ART: 178 mmHg — AB (ref 80.0–100.0)
PO2 ART: 175 mmHg — AB (ref 80.0–100.0)
Patient temperature: 36.1
Patient temperature: 36.9
Patient temperature: 37.1
Patient temperature: 37.4
TCO2: 28 mmol/L (ref 0–100)
TCO2: 27 mmol/L (ref 0–100)
TCO2: 25 mmol/L (ref 0–100)
TCO2: 25 mmol/L (ref 0–100)
TCO2: 25 mmol/L (ref 0–100)
TCO2: 23 mmol/L (ref 0–100)
pCO2 arterial: 29.9 mmHg — ABNORMAL LOW (ref 35.0–45.0)
pH, Arterial: 7.347 — ABNORMAL LOW (ref 7.350–7.450)
pH, Arterial: 7.264 — ABNORMAL LOW (ref 7.350–7.450)
pH, Arterial: 7.282 — ABNORMAL LOW (ref 7.350–7.450)
pO2, Arterial: 407 mmHg — ABNORMAL HIGH (ref 80.0–100.0)

## 2015-07-22 LAB — GLUCOSE, CAPILLARY
Glucose-Capillary: 89 mg/dL (ref 65–99)
Glucose-Capillary: 117 mg/dL — ABNORMAL HIGH (ref 65–99)
Glucose-Capillary: 166 mg/dL — ABNORMAL HIGH (ref 65–99)

## 2015-07-22 LAB — POCT I-STAT 4, (NA,K, GLUC, HGB,HCT)
GLUCOSE: 87 mg/dL (ref 65–99)
HCT: 28 % — ABNORMAL LOW (ref 36.0–46.0)
HEMOGLOBIN: 9.5 g/dL — AB (ref 12.0–15.0)
POTASSIUM: 3.6 mmol/L (ref 3.5–5.1)
SODIUM: 142 mmol/L (ref 135–145)

## 2015-07-22 LAB — APTT: aPTT: 37 seconds (ref 24–37)

## 2015-07-22 LAB — CREATININE, SERUM
CREATININE: 0.34 mg/dL — AB (ref 0.44–1.00)
GFR calc Af Amer: >60 mL/min (ref >60)
GFR calc non Af Amer: >60 mL/min (ref >60)

## 2015-07-22 LAB — PROTIME-INR
INR: 1.62 — AB (ref 0.00–1.49)
Prothrombin Time: 19.2 seconds — ABNORMAL HIGH (ref 11.6–15.2)

## 2015-07-22 LAB — HEMOGLOBIN AND HEMATOCRIT, BLOOD
HCT: 24.7 % — ABNORMAL LOW (ref 36.0–46.0)
HEMOGLOBIN: 8.2 g/dL — AB (ref 12.0–15.0)

## 2015-07-22 LAB — MAGNESIUM: Magnesium: 2.6 mg/dL — ABNORMAL HIGH (ref 1.7–2.4)

## 2015-07-22 LAB — PLATELET COUNT: Platelets: 157 10*3/uL (ref 150–400)

## 2015-07-22 SURGERY — REPAIR, MITRAL VALVE
Anesthesia: General | Site: Chest

## 2015-07-22 MED ORDER — 0.9 % SODIUM CHLORIDE (POUR BTL) OPTIME
TOPICAL | Status: DC | PRN
  Administered 2015-07-22: 6000 mL

## 2015-07-22 MED ORDER — MAGNESIUM SULFATE 4 GM/100ML IV SOLN
4.0000 g | Freq: Once | INTRAVENOUS | Status: AC
  Administered 2015-07-22: 4 g via INTRAVENOUS
  Filled 2015-07-22: qty 100

## 2015-07-22 MED ORDER — ASPIRIN 81 MG PO CHEW: 324.0000 mg | CHEWABLE_TABLET | Freq: Every day | ORAL | Status: DC

## 2015-07-22 MED ORDER — HEPARIN SODIUM (PORCINE) 1000 UNIT/ML IJ SOLN
INTRAMUSCULAR | Status: DC | PRN
  Administered 2015-07-22: 20000 [IU] via INTRAVENOUS

## 2015-07-22 MED ORDER — LACTATED RINGERS IV SOLN
INTRAVENOUS | Status: DC
  Administered 2015-07-22: 20 mL/h via INTRAVENOUS

## 2015-07-22 MED ORDER — ROCURONIUM BROMIDE 100 MG/10ML IV SOLN
INTRAVENOUS | Status: DC | PRN
  Administered 2015-07-22: 50 mg via INTRAVENOUS

## 2015-07-22 MED ORDER — SODIUM CHLORIDE 0.9 % IJ SOLN
OROMUCOSAL | Status: DC | PRN
  Administered 2015-07-22 (×3): 4 mL via TOPICAL

## 2015-07-22 MED ORDER — MIDAZOLAM HCL 5 MG/5ML IJ SOLN
INTRAMUSCULAR | Status: DC | PRN
  Administered 2015-07-22: 4 mg via INTRAVENOUS
  Administered 2015-07-22: 1 mg via INTRAVENOUS
  Administered 2015-07-22 (×2): 2 mg via INTRAVENOUS
  Administered 2015-07-22: 1 mg via INTRAVENOUS

## 2015-07-22 MED ORDER — VECURONIUM BROMIDE 10 MG IV SOLR
INTRAVENOUS | Status: DC | PRN
  Administered 2015-07-22 (×3): 5 mg via INTRAVENOUS

## 2015-07-22 MED ORDER — LACTATED RINGERS IV SOLN: 500.0000 mL | Freq: Once | INTRAVENOUS | Status: DC | PRN

## 2015-07-22 MED ORDER — PROPOFOL 10 MG/ML IV BOLUS
INTRAVENOUS | Status: DC | PRN
  Administered 2015-07-22: 50 mg via INTRAVENOUS

## 2015-07-22 MED ORDER — MORPHINE SULFATE (PF) 2 MG/ML IV SOLN
2.0000 mg | INTRAVENOUS | Status: DC | PRN
Start: 2015-07-22 — End: 2015-07-23
  Administered 2015-07-22: 2 mg via INTRAVENOUS
  Administered 2015-07-23: 1 mg via INTRAVENOUS
  Filled 2015-07-22: qty 1

## 2015-07-22 MED ORDER — MIDAZOLAM HCL 10 MG/2ML IJ SOLN
INTRAMUSCULAR | Status: AC
  Filled 2015-07-22: qty 2

## 2015-07-22 MED ORDER — PHENYLEPHRINE HCL 10 MG/ML IJ SOLN
0.0000 ug/min | INTRAVENOUS | Status: DC
  Administered 2015-07-23: 40 ug/min via INTRAVENOUS
  Administered 2015-07-24: 5 ug/min via INTRAVENOUS
  Filled 2015-07-22 (×4): qty 2

## 2015-07-22 MED ORDER — LACTATED RINGERS IV SOLN
INTRAVENOUS | Status: DC | PRN
  Administered 2015-07-22: 07:00:00 via INTRAVENOUS

## 2015-07-22 MED ORDER — ASPIRIN EC 325 MG PO TBEC
325.0000 mg | DELAYED_RELEASE_TABLET | Freq: Every day | ORAL | Status: DC
  Filled 2015-07-22: qty 1

## 2015-07-22 MED ORDER — POTASSIUM CHLORIDE 10 MEQ/50ML IV SOLN
10.0000 meq | INTRAVENOUS | Status: AC
  Administered 2015-07-22 (×3): 10 meq via INTRAVENOUS

## 2015-07-22 MED ORDER — CHLORHEXIDINE GLUCONATE 0.12 % MT SOLN
15.0000 mL | OROMUCOSAL | Status: AC
  Administered 2015-07-22: 15 mL via OROMUCOSAL

## 2015-07-22 MED ORDER — PHENYLEPHRINE HCL 10 MG/ML IJ SOLN
10.0000 mg | INTRAVENOUS | Status: DC | PRN
  Administered 2015-07-22: 10 ug/min via INTRAVENOUS

## 2015-07-22 MED ORDER — INSULIN REGULAR BOLUS VIA INFUSION
0.0000 [IU] | Freq: Three times a day (TID) | INTRAVENOUS | Status: DC
  Filled 2015-07-22: qty 10

## 2015-07-22 MED ORDER — BISACODYL 5 MG PO TBEC
10.0000 mg | DELAYED_RELEASE_TABLET | Freq: Every day | ORAL | Status: DC
  Administered 2015-07-24 – 2015-07-27 (×3): 10 mg via ORAL
  Filled 2015-07-22 (×5): qty 2

## 2015-07-22 MED ORDER — ACETAMINOPHEN 500 MG PO TABS
1000.0000 mg | ORAL_TABLET | Freq: Four times a day (QID) | ORAL | Status: AC
  Administered 2015-07-23 – 2015-07-27 (×13): 1000 mg via ORAL
  Filled 2015-07-22 (×14): qty 2

## 2015-07-22 MED ORDER — FENTANYL CITRATE (PF) 100 MCG/2ML IJ SOLN
INTRAMUSCULAR | Status: DC | PRN
  Administered 2015-07-22: 50 ug via INTRAVENOUS
  Administered 2015-07-22: 150 ug via INTRAVENOUS
  Administered 2015-07-22: 50 ug via INTRAVENOUS
  Administered 2015-07-22 (×2): 100 ug via INTRAVENOUS
  Administered 2015-07-22 (×2): 150 ug via INTRAVENOUS
  Administered 2015-07-22: 100 ug via INTRAVENOUS
  Administered 2015-07-22 (×2): 150 ug via INTRAVENOUS
  Administered 2015-07-22: 100 ug via INTRAVENOUS

## 2015-07-22 MED ORDER — BISACODYL 10 MG RE SUPP: 10.0000 mg | Freq: Every day | RECTAL | Status: DC

## 2015-07-22 MED ORDER — ONDANSETRON HCL 4 MG/2ML IJ SOLN
4.0000 mg | Freq: Four times a day (QID) | INTRAMUSCULAR | Status: DC | PRN
  Administered 2015-07-23 – 2015-07-24 (×5): 4 mg via INTRAVENOUS
  Filled 2015-07-22 (×5): qty 2

## 2015-07-22 MED ORDER — SODIUM CHLORIDE 0.9 % IR SOLN
Status: DC | PRN
  Administered 2015-07-22: 3000 mL

## 2015-07-22 MED ORDER — ACETAMINOPHEN 160 MG/5ML PO SOLN: 1000.0000 mg | Freq: Four times a day (QID) | ORAL | Status: DC

## 2015-07-22 MED ORDER — DOCUSATE SODIUM 100 MG PO CAPS
200.0000 mg | ORAL_CAPSULE | Freq: Every day | ORAL | Status: DC
  Administered 2015-07-24 – 2015-07-27 (×3): 200 mg via ORAL
  Filled 2015-07-22 (×6): qty 2

## 2015-07-22 MED ORDER — FENTANYL CITRATE (PF) 250 MCG/5ML IJ SOLN
INTRAMUSCULAR | Status: AC
  Filled 2015-07-22: qty 25

## 2015-07-22 MED ORDER — INSULIN ASPART 100 UNIT/ML ~~LOC~~ SOLN
0.0000 [IU] | SUBCUTANEOUS | Status: DC
  Administered 2015-07-22: 2 [IU] via SUBCUTANEOUS
  Administered 2015-07-22: 4 [IU] via SUBCUTANEOUS
  Administered 2015-07-23 (×2): 2 [IU] via SUBCUTANEOUS

## 2015-07-22 MED ORDER — PANTOPRAZOLE SODIUM 40 MG PO TBEC
40.0000 mg | DELAYED_RELEASE_TABLET | Freq: Every day | ORAL | Status: DC
  Administered 2015-07-24 – 2015-07-25 (×2): 40 mg via ORAL
  Filled 2015-07-22 (×4): qty 1

## 2015-07-22 MED ORDER — VANCOMYCIN HCL IN DEXTROSE 1-5 GM/200ML-% IV SOLN
1000.0000 mg | Freq: Once | INTRAVENOUS | Status: AC
  Administered 2015-07-22: 1000 mg via INTRAVENOUS
  Filled 2015-07-22: qty 200

## 2015-07-22 MED ORDER — HEMOSTATIC AGENTS (NO CHARGE) OPTIME
TOPICAL | Status: DC | PRN
  Administered 2015-07-22: 1 via TOPICAL

## 2015-07-22 MED ORDER — ALBUMIN HUMAN 5 % IV SOLN
INTRAVENOUS | Status: DC | PRN
  Administered 2015-07-22: 12:00:00 via INTRAVENOUS

## 2015-07-22 MED ORDER — SODIUM CHLORIDE 0.9 % IV SOLN
250.0000 mL | INTRAVENOUS | Status: DC
  Administered 2015-07-23: 250 mL via INTRAVENOUS

## 2015-07-22 MED ORDER — FAMOTIDINE IN NACL 20-0.9 MG/50ML-% IV SOLN
20.0000 mg | Freq: Two times a day (BID) | INTRAVENOUS | Status: DC
  Administered 2015-07-22: 20 mg via INTRAVENOUS

## 2015-07-22 MED ORDER — NITROGLYCERIN IN D5W 200-5 MCG/ML-% IV SOLN: 0.0000 ug/min | INTRAVENOUS | Status: DC

## 2015-07-22 MED ORDER — SODIUM CHLORIDE 0.9 % IJ SOLN
3.0000 mL | Freq: Two times a day (BID) | INTRAMUSCULAR | Status: DC
  Administered 2015-07-23 – 2015-07-26 (×4): 3 mL via INTRAVENOUS

## 2015-07-22 MED ORDER — LACTATED RINGERS IV SOLN
INTRAVENOUS | Status: DC | PRN
  Administered 2015-07-22 (×2): via INTRAVENOUS

## 2015-07-22 MED ORDER — METOPROLOL TARTRATE 12.5 MG HALF TABLET: 12.5000 mg | ORAL_TABLET | Freq: Two times a day (BID) | ORAL | Status: DC

## 2015-07-22 MED ORDER — MORPHINE SULFATE (PF) 2 MG/ML IV SOLN
1.0000 mg | INTRAVENOUS | Status: DC | PRN
  Administered 2015-07-23: 1 mg via INTRAVENOUS
  Filled 2015-07-22 (×2): qty 1

## 2015-07-22 MED ORDER — MIDAZOLAM HCL 2 MG/2ML IJ SOLN: 2.0000 mg | INTRAMUSCULAR | Status: DC | PRN

## 2015-07-22 MED ORDER — VECURONIUM BROMIDE 10 MG IV SOLR: INTRAVENOUS | Status: DC | PRN

## 2015-07-22 MED ORDER — PROPOFOL 10 MG/ML IV BOLUS
INTRAVENOUS | Status: AC
  Filled 2015-07-22: qty 20

## 2015-07-22 MED ORDER — ACETAMINOPHEN 160 MG/5ML PO SOLN: 650.0000 mg | Freq: Once | ORAL | Status: AC

## 2015-07-22 MED ORDER — ALBUMIN HUMAN 5 % IV SOLN
12.5000 g | Freq: Once | INTRAVENOUS | Status: AC
  Administered 2015-07-22: 12.5 g via INTRAVENOUS
  Filled 2015-07-22: qty 250

## 2015-07-22 MED ORDER — SODIUM CHLORIDE 0.9 % IJ SOLN: 3.0000 mL | INTRAMUSCULAR | Status: DC | PRN

## 2015-07-22 MED ORDER — ALBUMIN HUMAN 5 % IV SOLN
250.0000 mL | INTRAVENOUS | Status: AC | PRN
  Administered 2015-07-22 (×4): 250 mL via INTRAVENOUS
  Filled 2015-07-22: qty 250

## 2015-07-22 MED ORDER — OXYCODONE HCL 5 MG PO TABS
5.0000 mg | ORAL_TABLET | ORAL | Status: DC | PRN
  Administered 2015-07-23: 5 mg via ORAL
  Filled 2015-07-22: qty 1

## 2015-07-22 MED ORDER — SODIUM CHLORIDE 0.9 % IV SOLN
INTRAVENOUS | Status: DC
  Filled 2015-07-22: qty 2.5

## 2015-07-22 MED ORDER — METOPROLOL TARTRATE 1 MG/ML IV SOLN: 2.5000 mg | INTRAVENOUS | Status: DC | PRN

## 2015-07-22 MED ORDER — DEXMEDETOMIDINE HCL IN NACL 200 MCG/50ML IV SOLN: 0.0000 ug/kg/h | INTRAVENOUS | Status: DC

## 2015-07-22 MED ORDER — SODIUM CHLORIDE 0.9 % IV SOLN: INTRAVENOUS | Status: DC

## 2015-07-22 MED ORDER — SODIUM CHLORIDE 0.45 % IV SOLN: INTRAVENOUS | Status: DC | PRN

## 2015-07-22 MED ORDER — DEXTROSE 5 % IV SOLN
1.5000 g | Freq: Two times a day (BID) | INTRAVENOUS | Status: AC
  Administered 2015-07-22 – 2015-07-24 (×4): 1.5 g via INTRAVENOUS
  Filled 2015-07-22 (×4): qty 1.5

## 2015-07-22 MED ORDER — METOPROLOL TARTRATE 25 MG/10 ML ORAL SUSPENSION: 12.5000 mg | Freq: Two times a day (BID) | ORAL | Status: DC

## 2015-07-22 MED ORDER — ACETAMINOPHEN 650 MG RE SUPP
650.0000 mg | Freq: Once | RECTAL | Status: AC
  Administered 2015-07-22: 650 mg via RECTAL

## 2015-07-22 MED ORDER — PROTAMINE SULFATE 10 MG/ML IV SOLN
INTRAVENOUS | Status: DC | PRN
  Administered 2015-07-22: 10 mg via INTRAVENOUS
  Administered 2015-07-22: 190 mg via INTRAVENOUS

## 2015-07-22 MED ORDER — TRAMADOL HCL 50 MG PO TABS
50.0000 mg | ORAL_TABLET | ORAL | Status: DC | PRN
  Administered 2015-07-23 (×2): 100 mg via ORAL
  Filled 2015-07-22 (×2): qty 2

## 2015-07-22 MED FILL — Heparin Sodium (Porcine) Inj 1000 Unit/ML: INTRAMUSCULAR | Qty: 30 | Status: AC

## 2015-07-22 MED FILL — Potassium Chloride Inj 2 mEq/ML: INTRAVENOUS | Qty: 40 | Status: AC

## 2015-07-22 MED FILL — Magnesium Sulfate Inj 50%: INTRAMUSCULAR | Qty: 10 | Status: AC

## 2015-07-22 SURGICAL SUPPLY — 130 items
ADAPTER CARDIO PERF ANTE/RETRO (ADAPTER) ×4 IMPLANT
APPLICATOR COTTON TIP 6IN STRL (MISCELLANEOUS) IMPLANT
BAG DECANTER FOR FLEXI CONT (MISCELLANEOUS) ×4 IMPLANT
BLADE STERNUM SYSTEM 6 (BLADE) ×4 IMPLANT
BLADE SURG 11 STRL SS (BLADE) ×4 IMPLANT
CANISTER SUCTION 2500CC (MISCELLANEOUS) ×4 IMPLANT
CANN PRFSN 3/8X14X24FR PCFC (MISCELLANEOUS)
CANN PRFSN 3/8XCNCT ST RT ANG (MISCELLANEOUS)
CANNULA AORTIC ROOT 9FR (CANNULA) ×4 IMPLANT
CANNULA EZ GLIDE AORTIC 21FR (CANNULA) IMPLANT
CANNULA FEM VENOUS REMOTE 22FR (CANNULA) ×4 IMPLANT
CANNULA GUNDRY RCSP 15FR (MISCELLANEOUS) IMPLANT
CANNULA PRFSN 3/8X14X24FR PCFC (MISCELLANEOUS) IMPLANT
CANNULA PRFSN 3/8XCNCT RT ANG (MISCELLANEOUS) IMPLANT
CANNULA SUMP PERICARDIAL (CANNULA) ×4 IMPLANT
CANNULA VEN MTL TIP RT (MISCELLANEOUS)
CANNULA VENNOUS METAL TIP 20FR (CANNULA) ×4 IMPLANT
CATH EMB 6FR 80CM (CATHETERS) ×4 IMPLANT
CATH FOLEY 2WAY SLVR  5CC 14FR (CATHETERS)
CATH FOLEY 2WAY SLVR 5CC 14FR (CATHETERS) IMPLANT
CATH RETROPLEGIA CORONARY 14FR (CATHETERS) ×4 IMPLANT
CATH THORACIC 28FR RT ANG (CATHETERS) IMPLANT
CATH THORACIC 36FR (CATHETERS) IMPLANT
CLIP FOGARTY SPRING 6M (CLIP) IMPLANT
CONN 1/2X1/2X1/2  BEN (MISCELLANEOUS) ×2
CONN 1/2X1/2X1/2 BEN (MISCELLANEOUS) ×6 IMPLANT
CONN 3/8X1/2 ST GISH (MISCELLANEOUS) ×8 IMPLANT
CONN ST 1/4X3/8  BEN (MISCELLANEOUS) ×1
CONN ST 1/4X3/8 BEN (MISCELLANEOUS) ×3 IMPLANT
CONNECTOR 1/2X3/8X1/2 3 WAY (MISCELLANEOUS) ×1
CONNECTOR 1/2X3/8X1/2 3WAY (MISCELLANEOUS) ×3 IMPLANT
COVER PROBE W GEL 5X96 (DRAPES) ×4 IMPLANT
COVER SURGICAL LIGHT HANDLE (MISCELLANEOUS) ×4 IMPLANT
CRADLE DONUT ADULT HEAD (MISCELLANEOUS) IMPLANT
DEVICE SUT CK QUICK LOAD MINI (Prosthesis & Implant Heart) ×4 IMPLANT
DRAIN CHANNEL 32F RND 10.7 FF (WOUND CARE) IMPLANT
DRAPE BILATERAL SPLIT (DRAPES) ×4 IMPLANT
DRAPE CARDIOVASCULAR INCISE (DRAPES)
DRAPE CV SPLIT W-CLR ANES SCRN (DRAPES) ×4 IMPLANT
DRAPE INCISE IOBAN 66X45 STRL (DRAPES) ×8 IMPLANT
DRAPE PROXIMA HALF (DRAPES) ×4 IMPLANT
DRAPE SLUSH/WARMER DISC (DRAPES) ×4 IMPLANT
DRAPE SRG 135X102X78XABS (DRAPES) IMPLANT
DRSG AQUACEL AG ADV 3.5X14 (GAUZE/BANDAGES/DRESSINGS) ×4 IMPLANT
DRSG COVADERM 4X14 (GAUZE/BANDAGES/DRESSINGS) ×4 IMPLANT
ELECT BLADE 4.0 EZ CLEAN MEGAD (MISCELLANEOUS) ×4
ELECT REM PT RETURN 9FT ADLT (ELECTROSURGICAL) ×8
ELECTRODE BLDE 4.0 EZ CLN MEGD (MISCELLANEOUS) ×3 IMPLANT
ELECTRODE REM PT RTRN 9FT ADLT (ELECTROSURGICAL) ×6 IMPLANT
GAUZE SPONGE 4X4 12PLY STRL (GAUZE/BANDAGES/DRESSINGS) ×4 IMPLANT
GLOVE BIO SURGEON STRL SZ 6 (GLOVE) ×8 IMPLANT
GLOVE BIO SURGEON STRL SZ 6.5 (GLOVE) ×4 IMPLANT
GLOVE BIO SURGEON STRL SZ7 (GLOVE) IMPLANT
GLOVE BIO SURGEON STRL SZ7.5 (GLOVE) IMPLANT
GLOVE BIOGEL PI IND STRL 6 (GLOVE) ×3 IMPLANT
GLOVE BIOGEL PI IND STRL 6.5 (GLOVE) ×9 IMPLANT
GLOVE BIOGEL PI IND STRL 7.0 (GLOVE) ×6 IMPLANT
GLOVE BIOGEL PI INDICATOR 6 (GLOVE) ×1
GLOVE BIOGEL PI INDICATOR 6.5 (GLOVE) ×3
GLOVE BIOGEL PI INDICATOR 7.0 (GLOVE) ×2
GLOVE ORTHO TXT STRL SZ7.5 (GLOVE) IMPLANT
GOWN STRL REUS W/ TWL LRG LVL3 (GOWN DISPOSABLE) ×18 IMPLANT
GOWN STRL REUS W/TWL LRG LVL3 (GOWN DISPOSABLE) ×6
HEMOSTAT POWDER SURGIFOAM 1G (HEMOSTASIS) ×12 IMPLANT
INSERT FOGARTY XLG (MISCELLANEOUS) ×4 IMPLANT
KIT BASIN OR (CUSTOM PROCEDURE TRAY) ×4 IMPLANT
KIT DILATOR VASC 18G NDL (KITS) ×4 IMPLANT
KIT DRAINAGE VACCUM ASSIST (KITS) ×4 IMPLANT
KIT ROOM TURNOVER OR (KITS) ×4 IMPLANT
KIT SUCTION CATH 14FR (SUCTIONS) ×16 IMPLANT
KIT SUT CK MINI COMBO 4X17 (Prosthesis & Implant Heart) ×4 IMPLANT
LINE VENT (MISCELLANEOUS) ×4 IMPLANT
LOOP VESSEL SUPERMAXI WHITE (MISCELLANEOUS) ×4 IMPLANT
MARKER GRAFT CORONARY BYPASS (MISCELLANEOUS) IMPLANT
NS IRRIG 1000ML POUR BTL (IV SOLUTION) ×24 IMPLANT
PACK OPEN HEART (CUSTOM PROCEDURE TRAY) ×4 IMPLANT
PAD ARMBOARD 7.5X6 YLW CONV (MISCELLANEOUS) ×16 IMPLANT
RING MITRAL MEMO 3D 26MM SMD26 (Prosthesis & Implant Heart) ×4 IMPLANT
SET CARDIOPLEGIA MPS 5001102 (MISCELLANEOUS) ×4 IMPLANT
SET IRRIG TUBING LAPAROSCOPIC (IRRIGATION / IRRIGATOR) ×4 IMPLANT
SPONGE LAP 4X18 X RAY DECT (DISPOSABLE) ×4 IMPLANT
STOPCOCK 4 WAY LG BORE MALE ST (IV SETS) ×4 IMPLANT
SUCKER INTRACARDIAC WEIGHTED (SUCKER) ×4 IMPLANT
SUT BONE WAX W31G (SUTURE) ×4 IMPLANT
SUT ETHIBOND (SUTURE) ×12 IMPLANT
SUT ETHIBOND 2 0 SH (SUTURE) ×5 IMPLANT
SUT ETHIBOND 2 0 SH 36X2 (SUTURE) ×15 IMPLANT
SUT ETHIBOND 2 0 V4 (SUTURE) IMPLANT
SUT ETHIBOND 2 0V4 GREEN (SUTURE) IMPLANT
SUT ETHIBOND 2-0 RB-1 WHT (SUTURE) ×12 IMPLANT
SUT ETHIBOND 4 0 TF (SUTURE) IMPLANT
SUT ETHIBOND 5 0 C 1 30 (SUTURE) ×4 IMPLANT
SUT ETHIBOND X763 2 0 SH 1 (SUTURE) IMPLANT
SUT MNCRL AB 3-0 PS2 18 (SUTURE) ×8 IMPLANT
SUT PDS AB 1 CTX 36 (SUTURE) ×8 IMPLANT
SUT PROLENE 3 0 SH 1 (SUTURE) ×4 IMPLANT
SUT PROLENE 3 0 SH DA (SUTURE) ×8 IMPLANT
SUT PROLENE 3 0 SH1 36 (SUTURE) ×12 IMPLANT
SUT PROLENE 4 0 RB 1 (SUTURE) ×3
SUT PROLENE 4 0 SH DA (SUTURE) ×8 IMPLANT
SUT PROLENE 4-0 RB1 .5 CRCL 36 (SUTURE) ×9 IMPLANT
SUT PROLENE 5 0 C 1 36 (SUTURE) ×12 IMPLANT
SUT PROLENE 6 0 C 1 30 (SUTURE) ×8 IMPLANT
SUT SILK  1 MH (SUTURE) ×5
SUT SILK 1 MH (SUTURE) ×15 IMPLANT
SUT SILK 1 TIES 10X30 (SUTURE) ×4 IMPLANT
SUT SILK 2 0 SH CR/8 (SUTURE) ×8 IMPLANT
SUT SILK 2 0 TIES 10X30 (SUTURE) ×4 IMPLANT
SUT SILK 2 0 TIES 17X18 (SUTURE) ×1
SUT SILK 2-0 18XBRD TIE BLK (SUTURE) ×3 IMPLANT
SUT SILK 3 0 SH CR/8 (SUTURE) ×4 IMPLANT
SUT SILK 4 0 TIE 10X30 (SUTURE) ×8 IMPLANT
SUT STEEL 6MS V (SUTURE) IMPLANT
SUT STEEL STERNAL CCS#1 18IN (SUTURE) IMPLANT
SUT STEEL SZ 6 DBL 3X14 BALL (SUTURE) IMPLANT
SUT TEM PAC WIRE 2 0 SH (SUTURE) ×8 IMPLANT
SUT VIC AB 1 CTX 36 (SUTURE) ×1
SUT VIC AB 1 CTX36XBRD ANBCTR (SUTURE) ×3 IMPLANT
SUT VIC AB 2-0 CTX 27 (SUTURE) ×8 IMPLANT
SYR 3ML LL SCALE MARK (SYRINGE) ×4 IMPLANT
SYS ARTICLIP LAA EXCLUSION 135 (Clip) ×4 IMPLANT
SYSTEM SAHARA CHEST DRAIN ATS (WOUND CARE) ×4 IMPLANT
TAPE CLOTH SURG 4X10 WHT LF (GAUZE/BANDAGES/DRESSINGS) ×4 IMPLANT
TOWEL OR 17X24 6PK STRL BLUE (TOWEL DISPOSABLE) ×4 IMPLANT
TOWEL OR 17X26 10 PK STRL BLUE (TOWEL DISPOSABLE) ×8 IMPLANT
TRAY FOLEY IC TEMP SENS 16FR (CATHETERS) ×4 IMPLANT
TUBE SUCT INTRACARD DLP 20F (MISCELLANEOUS) ×4 IMPLANT
TUBING INSUFFLATION 10FT LAP (TUBING) ×4 IMPLANT
UNDERPAD 30X30 INCONTINENT (UNDERPADS AND DIAPERS) ×4 IMPLANT
WATER STERILE IRR 1000ML POUR (IV SOLUTION) ×8 IMPLANT

## 2015-07-22 NOTE — Op Note (Addendum)
CARDIOTHORACIC SURGERY OPERATIVE NOTE  Date of Procedure:  07/22/2015  Preoperative Diagnosis:   Severe Mitral Regurgitation  Patent Ductus Arteriosus  Postoperative Diagnosis: Same  Procedure:    Mitral Valve Repair  Sorin Memo 3D ring annuloplasty (size 26mm, catalog #[id number], serial #[id number])    Ligation of Patent Ductus Arteriosus   Clipping of Left Atrial Appendage   Atricure Atriclip pro-1 clip (size 35mm)   Surgeon: Salvatore Decent. Cornelius Moras, MD  Assistant: Lowella Dandy, PA-C  Anesthesia: Kipp Brood, MD  Operative Findings:  Type I mitral valve dysfunction with moderate-severe mitral regurgitation  Patent ductus arteriosus  Low-normal LV systolic function, EF 50%  No residual mitral regurgitation after successful valve repair                   BRIEF CLINICAL NOTE AND INDICATIONS FOR SURGERY  Patient is a 55 year old female with recently discovered mitral regurgitation, chronic combined systolic and diastolic congestive heart failure, and syncope who has been referred for surgical consultation to discuss treatment options for management of severe symptomatic mitral regurgitation. The patient describes a several history year history of persistent dry nonproductive cough. Over the past 1-1/2-2 years she has developed progressive exertional shortness of breath. More recently she has experienced worsening shortness of breath with frequent dizzy spells and at least 2 syncopal episodes. She was evaluated in the emergency department where her blood pressure and heart rate were normal. Cardiac enzymes were negative. She has been followed intermittently by her primary care physician and referred to a neurologist. MRI of the brain and EEG were both unremarkable. She was referred for cardiology consultation and evaluated by Dr. Duke Salvia on 05/29/2015. Transthoracic echocardiogram revealed moderate left ventricular systolic dysfunction with ejection fraction estimated  45-50%. There was severe mitral regurgitation with findings suggestive of restriction of the posterior leaflet. There was flow reversal in the pulmonary veins. There was severe left atrial enlargement. Transesophageal echocardiogram confirmed the presence of at least moderate to severe mitral regurgitation and also suggested possible restriction of the posterior leaflet. Left heart catheterization was performed 07/08/2015 by Dr. Herbie Baltimore. The patient was found have normal coronary arteries with no significant coronary artery disease. There was mild to moderate left ventricular systolic dysfunction with ejection fraction estimated 40-45%. There was severe (4+) mitral regurgitation. Pulmonary artery pressures were not reported but the patient was noted to have moderate to severely reduced cardiac output and large V waves on PCWP waveform. The patient was referred for surgical consultation.  The patient has been seen in consultation and counseled at length regarding the indications, risks and potential benefits of surgery.  CT angiogram of the chest revealed the presence of a patent ductus arteriosus.  Alternative treatment strategies were discussed including mitral valve repair with ligation of PDA via median sternotomy versus preoperative coiling of the PDA followed by minimally-invasive mitral valve repair.  All questions have been answered, and the patient provides full informed consent for the operation as described.    DETAILS OF THE OPERATIVE PROCEDURE  Preparation:  The patient is brought to the operating room on the above mentioned date and central monitoring was established by the anesthesia team including placement of Swan-Ganz catheter and radial arterial line. The patient is placed in the supine position on the operating table.  Intravenous antibiotics are administered. General endotracheal anesthesia is induced uneventfully. A Foley catheter is placed.  Baseline transesophageal echocardiogram  was performed.  Findings were notable for low-normal left ventricular systolic function with ejection fraction estimated 50%. There  was mild left ventricular hypertrophy. There was moderate left atrial enlargement. There was moderate to severe mitral regurgitation. There appeared to be relatively normal leaflet mobility involving both the anterior and the posterior leaflet of the mitral valve with primarily type I dysfunction. There may be some degree of restriction of the posterior leaflet.  There was some acoustic shadowing from the posterior mitral annulus but for the most part the subvalvular apparatus appeared normal. There was no thickening or foreshortening of any of the chordae tendineae. There was no mitral valve prolapse.  There was a fairly broad central jet of mitral regurgitation. Aortic valve appeared normal.  Right ventricular size and systolic function appeared normal. There was trivial tricuspid regurgitation. Careful evaluation of the distal pulmonary artery confirmed the presence of a patent ductus arteriosus with left-to-right shunting. Mixed venous oxygen saturations were obtained from the distal pulmonary artery catheter port and the right atrial port. There was a step up in oxygen saturation from 67% to 72%, consistent with a small left-to-right shunt (QP:QS estimated 1.2:1)  The patient's chest, abdomen, both groins, and both lower extremities are prepared and draped in a sterile manner. A time out procedure is performed.   Surgical Approach:  A median sternotomy incision was performed and the pericardium is opened. The ascending aorta is normal in appearance. The pulmonary artery is dissected away from the proximal ascending aorta. The reflection of the pericardium was divided to expose the distal portion of the transverse aortic arch, the bifurcation of the pulmonary artery and proximal left main pulmonary artery.   Extracorporeal Cardiopulmonary Bypass and Myocardial  Protection:  The right common femoral vein is cannulated using the Seldinger technique and a guidewire advanced into the right atrium using TEE guidance.  The patient is heparinized systemically and the right common femoral vein cannulated using a 22 Fr long femoral venous cannula.  The ascending aorta is cannulated for cardiopulmonary bypass.  Adequate heparinization is verified.   A retrograde cardioplegia cannula is placed through the right atrium into the coronary sinus.   The entire pre-bypass portion of the operation was notable for stable hemodynamics.  Cardiopulmonary bypass was begun and the surface of the heart is inspected.  Dissection is continued to separate the distal portion of the main pulmonary artery and the bifurcation of the pulmonary artery from the inferior surface of the transverse aortic arch. The proximal end of the patent ductus arteriosus is identified.  A second venous cannula is placed directly into the superior vena cava.   A cardioplegia cannula is placed in the ascending aorta.  A temperature probe was placed in the interventricular septum.  The operative field is continuously flooded with carbon dioxide.  The patient is cooled to 32C systemic temperature.  The aortic cross clamp is applied and cold blood cardioplegia is delivered initially in an antegrade fashion through the aortic root.  Supplemental cardioplegia is given retrograde through the coronary sinus catheter.  Iced saline slush is applied for topical hypothermia.  The initial cardioplegic arrest is rapid with early diastolic arrest.  Repeat doses of cardioplegia are administered intermittently throughout the entire cross clamp portion of the operation through the aortic root and through the coronary sinus catheter in order to maintain completely flat electrocardiogram and septal myocardial temperature below 15C.  Myocardial protection was felt to be excellent.   Ligation of Patent Ductus Arteriosus:  A small  longitudinal incision is made on the anterior surface of the distal main pulmonary artery. Through this incision the  opening of the patent ductus arteriosus was clearly identified. A #6 Fogarty embolectomy catheter is inserted through the patent ductus arteriosus and the balloon inflated to stop all shunt flow through the PDA. Dissection is carefully continued to identify the entire length of the PDA. The PDA is triply ligated using 3-0 Prolene sutures. The embolectomy catheter is removed just prior to tying the first suture.  2 of the sutures are placed in a horizontal mattress fashion with Teflon felt pledgets around the PDA. The endocardial surface of the orifice of the PDA was further oversewn with running 5-0 Prolene suture from within the pulmonary artery. 5-0 Prolene sutures then utilized to close the incision in the pulmonary artery.    Mitral Valve Repair:  A left atriotomy incision was performed through the interatrial groove and extended partially across the back wall of the left atrium after opening the oblique sinus inferiorly.  The mitral valve is exposed using a self-retaining retractor.  The mitral valve was inspected and notable for normal leaflet appearance. There is minimal fibrosis of either leaflet. There is no sign of any scarring or foreshortening of any of the subvalvular apparatus. The entire posterior leaflet appeared thin and mobile.  The anatomical appearance of the valve appears consistent with pure type I dysfunction with normal leaflet mobility and dilated mitral annulus in the setting of relatively small sized mitral valve leaflets.  Interrupted 2-0 Ethibond horizontal mattress sutures were placed circumferentially around the entire mitral annulus.  The valve was tested with saline and appeared competent even without ring annuloplasty complete. The valve was sized to a 26 mm annuloplasty ring, based upon the transverse distance between the left and right commissures and the  height of the anterior leaflet, corresponding to a size just slightly larger than the overall surface area of the anterior leaflet.  A Sorin Memo 3D annuloplasty ring (size 26mm, catalog [id number], serial [id number]) was secured in place uneventfully. All sutures were secured using a Cor-knot device.    The valve is tested with saline and appears to be perfectly competent with a broad symmetrical line of coaptation of the anterior and posterior leaflet. There is no residual leak.  Rewarming is begun.   Clipping of Left Atrial Appendage:  The left atrial appendage is obliterated using an Atricure left atrial clip (Atriclip Pro-1, size 35 mm) applied under direct vision.   Procedure Completion:  The atriotomy was closed using a 2-layer closure of running 3-0 Prolene suture after placing a sump drain across the mitral valve to serve as a left ventricular vent.  One final dose of warm retrograde "hot shot" cardioplegia was administered retrograde through the coronary sinus catheter while all air was evacuated through the aortic root.  The aortic cross clamp was removed after a total cross clamp time of 82 minutes.  Epicardial pacing wires are fixed to the right ventricular outflow tract and to the right atrial appendage. The patient is rewarmed to 37C temperature. The aortic and left ventricular vents are removed.  The patient is weaned and disconnected from cardiopulmonary bypass.  The patient's rhythm at separation from bypass was AV paced.  The patient was weaned from cardioplegic bypass without any inotropic support. Total cardiopulmonary bypass time for the operation was 113 minutes.  Followup transesophageal echocardiogram performed after separation from bypass revealed a well-seated mitral annuloplasty ring and a mitral valve that was functioning normally and without any residual mitral regurgitation.  Mean transvalvular gradient across the mitral valve was estimated 2 mmHg.  The patent ductus  arteriosus was no longer visible.  Left ventricular function was unchanged from preoperatively.  The aortic and superior vena cava cannula were removed uneventfully. Protamine was administered to reverse the anticoagulation. The femoral venous cannula was removed and manual pressure held on the groin for 30 minutes.  The mediastinum and pleural space were inspected for hemostasis and irrigated with saline solution. The mediastinum and the left pleural space were drained using 3 chest tubes placed through separate stab incisions inferiorly.  The soft tissues anterior to the aorta were reapproximated loosely. The sternum is closed with double strength sternal wire. The soft tissues anterior to the sternum were closed in multiple layers and the skin is closed with a running subcuticular skin closure.   The post-bypass portion of the operation was notable for stable rhythm and hemodynamics.   No blood products were administered during the operation.   Disposition:  The patient tolerated the procedure well.  The patient was transported to the surgical intensive care unit in stable condition. There were no intraoperative complications. All sponge instrument and needle counts are verified correct at completion of the operation.    Salvatore Decent. Cornelius Moras MD 07/22/2015 12:49 PM

## 2015-07-22 NOTE — Progress Notes (Signed)
  Echocardiogram Echocardiogram Transesophageal has been performed.  Diane [name] 07/22/2015, 8:41 AM

## 2015-07-22 NOTE — Progress Notes (Signed)
07/22/2015 1400 Dr. Cornelius Moras made cardiac index 1.35. No new orders. Will continue to closely monitor patient.  Lyan Holck, Blanchard Kelch

## 2015-07-22 NOTE — Anesthesia Preprocedure Evaluation (Addendum)
Anesthesia Evaluation  Patient identified by MRN, date of birth, ID band Patient awake    Reviewed: Allergy & Precautions, NPO status , Patient's Chart, lab work & pertinent test results, reviewed documented beta blocker date and time   Airway Mallampati: II  TM Distance: >3 FB Neck ROM: Full    Dental  (+) Teeth Intact   Pulmonary shortness of breath and with exertion,    breath sounds clear to auscultation       Cardiovascular +CHF   Rhythm:Regular + Systolic murmurs    Neuro/Psych Anxiety    GI/Hepatic   Endo/Other    Renal/GU      Musculoskeletal   Abdominal   Peds  Hematology   Anesthesia Other Findings   Reproductive/Obstetrics                            Anesthesia Physical Anesthesia Plan  ASA: IV  Anesthesia Plan: General   Post-op Pain Management:    Induction: Intravenous  Airway Management Planned: Oral ETT  Additional Equipment: Arterial line, CVP, PA Cath, TEE, 3D TEE and Ultrasound Guidance Line Placement  Intra-op Plan:   Post-operative Plan: Post-operative intubation/ventilation  Informed Consent:   Dental advisory given  Plan Discussed with: Anesthesiologist, Surgeon and CRNA  Anesthesia Plan Comments:        Anesthesia Quick Evaluation

## 2015-07-22 NOTE — OR Nursing (Signed)
Forty-five minute call to SICU charge nurse at 1204.

## 2015-07-22 NOTE — Procedures (Signed)
Extubation Procedure Note  Patient Details:   Name: Diane [name] DOB: 09-22-1960 MRN: [id number]   Airway Documentation:     Evaluation  O2 sats: stable throughout and currently acceptable Complications: No apparent complications Patient did tolerate procedure well. Bilateral Breath Sounds: Clear   Yes   NIF >-20, VC 453.  Pt extubated per Rapid Wean Protocol.  Antoine Poche 07/22/2015, 6:19 PM

## 2015-07-22 NOTE — Anesthesia Postprocedure Evaluation (Signed)
Anesthesia Post Note  Patient: Public relations account executive  Procedure(s) Performed: Procedure(s) (LRB): MITRAL VALVE REPAIR (N/A) TRANSESOPHAGEAL ECHOCARDIOGRAM (TEE) (N/A) PATENT DUCTUS ARTERIOSUS (PDA) ligation (N/A) CLIPPING OF LEFT ATRIAL APPENDAGE  Patient location during evaluation: SICU Level of consciousness: patient remains intubated per anesthesia plan Pain management: pain level controlled Vital Signs Assessment: post-procedure vital signs reviewed and stable Respiratory status: patient on ventilator - see flowsheet for VS Cardiovascular status: stable Anesthetic complications: no    Last Vitals:  Filed Vitals:   07/22/15 1445 07/22/15 1500  BP:  102/63  Pulse: 80 80  Temp: 35.7 C 35.8 C  Resp: 15 20    Last Pain: There were no vitals filed for this visit.               Hutson Luft COKER

## 2015-07-22 NOTE — Addendum Note (Signed)
Addendum  created 07/22/15 1614 by Kipp Brood, MD   Modules edited: Notes Section   Notes Section:  File: [id number]; Pend: [id number]; Beverely Low: [id number]; Beverely Low: [id number]; Beverely Low: [id number]; Pend: [id number]

## 2015-07-22 NOTE — Anesthesia Procedure Notes (Signed)
Procedure Name: Intubation Date/Time: 07/22/2015 8:01 AM Performed by: Dairl Ponder Pre-anesthesia Checklist: Patient identified, Timeout performed, Emergency Drugs available, Suction available and Patient being monitored Patient Re-evaluated:Patient Re-evaluated prior to inductionOxygen Delivery Method: Circle system utilized Preoxygenation: Pre-oxygenation with 100% oxygen Intubation Type: IV induction Ventilation: Mask ventilation without difficulty Laryngoscope Size: Mac and 3 Grade View: Grade II Tube type: Oral Tube size: 8.0 mm Number of attempts: 1 Airway Equipment and Method: Stylet Placement Confirmation: ETT inserted through vocal cords under direct vision,  breath sounds checked- equal and bilateral and positive ETCO2 Secured at: 21 cm Tube secured with: Tape Dental Injury: Teeth and Oropharynx as per pre-operative assessment

## 2015-07-22 NOTE — Transfer of Care (Signed)
Immediate Anesthesia Transfer of Care Note  Patient: Diane [name]  Procedure(s) Performed: Procedure(s) with comments: MITRAL VALVE REPAIR (N/A) - 26 Sorin Memo 3D TRANSESOPHAGEAL ECHOCARDIOGRAM (TEE) (N/A) PATENT DUCTUS ARTERIOSUS (PDA) ligation (N/A) CLIPPING OF LEFT ATRIAL APPENDAGE - 35 Atriclip Pro  Patient Location: SICU  Anesthesia Type:General  Level of Consciousness: Patient remains intubated per anesthesia plan  Airway & Oxygen Therapy: Patient remains intubated per anesthesia plan  Post-op Assessment: Report given to RN and Post -op Vital signs reviewed and stable  Post vital signs: Reviewed and stable  Last Vitals:  Filed Vitals:   07/22/15 0625  BP: 110/36  Pulse: 82  Temp: 36.9 C  Resp: 18    Complications: No apparent anesthesia complications

## 2015-07-22 NOTE — Brief Op Note (Addendum)
07/22/2015  11:10 AM  PATIENT:  Diane [name]  55 y.o. female  PRE-OPERATIVE DIAGNOSIS:  MR  POST-OPERATIVE DIAGNOSIS:  MR  PROCEDURE:  Procedure(s):  MITRAL VALVE REPAIR (N/A) -Ring Annuloplasty utilizing 26 mm Sorin Memo 3D Ring  PATENT DUCTUS ARTERIOSUS (PDA) ligation (N/A)  CLIPPING OF LEFT ATRIAL APPENDAGE -35 Atricure Atrial Clip  TRANSESOPHAGEAL ECHOCARDIOGRAM (TEE) (N/A)  SURGEON:    Purcell Nails, MD  ASSISTANTS:  Lowella Dandy, PA-C  ANESTHESIA:   Kipp Brood, MD  CROSSCLAMP TIME:   48'  CARDIOPULMONARY BYPASS TIME: 113'  FINDINGS:  Type I mitral valve dysfunction with moderate-severe mitral regurgitation  Patent ductus arteriosus  Low-normal LV systolic function, EF 50%  No residual mitral regurgitation after successful valve repair  Mitral Valve Etiology  MV Insufficiency: Severe  MV Disease: Yes.  MV Stenosis: No mitral valve stenosis.  MV Disease Functional Class: MV Disease Functional Class: Type I.   Etiology (Choose at least one and up to five): Degenerative.  MV Lesions (Choose at least one): No additional lesions.    Mitral/Tricuspid/Pulmonary Valve Procedure  Mitral Valve Procedure Performed:  Repair: Annuloplasty.  Implant: Annuloplasty Device: Implant model number [id number], Size 26, Unique Device Identifier [id number].   COMPLICATIONS: None  BASELINE WEIGHT: 52 kg  PATIENT DISPOSITION:   TO SICU IN STABLE CONDITION  Purcell Nails, MD 07/22/2015 12:43 PM

## 2015-07-22 NOTE — CV Procedure (Signed)
Intra-operative Transesophageal Echocardiography Report:  Diane [name]  is a 55 year old female with mitral regurgitation, congestive heart failure, and syncope. She was also found on CT angiogram to have a patent ductus arteriosus. Left heart catheterization showed normal coronary arteries with mild left ventricular systolic dysfunction and ejection fraction of 40-45%. There was severe  mitral regurgitation. She is now scheduled to undergo mitral valve repair or replacement by Dr. Cornelius Moras with ligation of the patent ductus arteriosus.  Intra-operative transesophageal echocardiography was indicated to evaluate the mitral valve, to assess for any other valvular pathology, to assess the right and left ventricular function, and to assess the patent ductus arteriosus and evaluate adequacy of repair.  The patient was brought to the operating room at [facility] and general anesthesia was induced without difficulty. Following induction of anesthesia and endotracheal intubation, the transesophageal echocardiography probe was inserted into the esophagus without difficulty.  Impression: Pre-bypass findings:  1. Aortic valve: The aortic valve was trileaflet. The leaflets were mildly thickened but opened normally without restriction. There was trace aortic insufficiency.  2. Mitral valve: There was moderate to severe mitral regurgitation noted. There was a central jet of mitral regurgitation which appeared to be due to a reduced zone of coaptation of the leaflets. There were no structural abnormalities of the leaflets noted. There are no flail or prolapsing segments noted. The mitral annulus appeared dilated but the left ventricular cavity was not dilated. The jet of mitral regurgitation was most prominent in the P3 A3 region.  3. Left ventricle: The left ventricular cavity was of normal size. There was mildly reduced contractility of the left ventricule which was global. This resulted in an ejection  fraction estimated at 50-55%. There were no regional wall motion abnormalities. There was mild concentric left ventricular hypertrophy with the LV posterior wall and  anterior wall measuring 1.1 cm in diameter at end diastole at the mid-papillary level. Left ventricular end-diastolic diameter measured 43 mm. There was no thrombus noted within left ventricular apex.  4. Right ventricle: The right ventricular cavity was of normal size. There was normal contractility the right ventricular free wall and normal appearing lateral tricuspid annular systolic excursion.  5. Tricuspid valve: The tricuspid valve appeared structurally normal. The tricuspid annulus was normal in diameter and measured 2.8 cm. There was 1+ tricuspid insufficiency.  6. Interatrial septum: The interatrial septum was intact without evidence of patent foramen ovale or atrial septal defect by color Doppler and bubble study.  7. Left atrium: The left atrial cavity was mildly enlarged and measured 4.8 cm  in the mediolateral dimension. There was no thrombus noted within the left atrial cavity or left atrial appendage.   8. Ascending aorta: There was a well-defined aortic root and sinotubular ridge without effacement or dilatation of the aortic root.  9. Descending aorta: The descending aorta was examined and in the region of the left subclavian artery, a patent ductus arteriosus could be imaged with color Doppler. This appeared to result in continuous-flow from the descending aorta into the main pulmonary artery and into the area of the bifurcation of the of the main pulmonary artery.  Post-bypass findings:   1. Aortic valve: The aortic valve was unchanged from the pre-bypass study. The leaflets opened normally. There was a trace amount of aortic insufficiency noted.  2. Mitral valve: The mitral valve showed there was an annuloplasty ring present and the posterior leaflet was fixed and the anterior leaflet was freely mobile. There was  no  mitral insufficiency. There was a mean trans-mitral gradient of 2 mmHg.  3. Left ventricle: There was mild global hypokinesis of the left ventricular cavity and the ejection fraction was estimated at 45-50%. There was  dyssynchrony of left ventricular contraction due to ventricular pacing.  4. Right ventricle: The right ventricular cavity was normal in size. There was normal appearing contractility of the right ventricular free wall and normal appearing right ventricular function.  5. Tricuspid valve: The tricuspid valve appeared structurally unchanged from the pre-bypass study and there was 1+ tricuspid insufficiency.  6. Descending aorta and pulmonary artery: The area in which the patent ductus arteriosus was imaged was carefully examined with and without color Doppler and  no residual patent ductus arteriosus could be appreciated.  Kipp Brood,

## 2015-07-22 NOTE — OR Nursing (Signed)
Twenty minute call to SICU charge nurse at 1233.

## 2015-07-22 NOTE — Interval H&P Note (Signed)
History and Physical Interval Note:  07/22/2015 6:10 AM  Diane [name]  has presented today for surgery, with the diagnosis of MR  The various methods of treatment have been discussed with the patient and family. After consideration of risks, benefits and other options for treatment, the patient has consented to  Procedure(s): MITRAL VALVE REPAIR or replacement (MVR) (N/A) TRANSESOPHAGEAL ECHOCARDIOGRAM (TEE) (N/A) PATENT DUCTUS ARTERIOSUS (PDA) ligation (N/A) as a surgical intervention .  The patient's history has been reviewed, patient examined, no change in status, stable for surgery.  I have reviewed the patient's chart and labs.  Questions were answered to the patient's satisfaction.     Purcell Nails

## 2015-07-22 NOTE — Progress Notes (Signed)
[name], female   DOB: Nov 26, 1960, 55 y.o.   MRN: [id number] EVENING ROUNDS NOTE :     [address]       [place] [address]             [phone]                 Day of Surgery Procedure(s) (LRB): MITRAL VALVE REPAIR (N/A) TRANSESOPHAGEAL ECHOCARDIOGRAM (TEE) (N/A) PATENT DUCTUS ARTERIOSUS (PDA) ligation (N/A) CLIPPING OF LEFT ATRIAL APPENDAGE  Total Length of Stay:  LOS: 0 days  BP 81/48 mmHg  Pulse 80  Temp(Src) 98.6 F (37 C) (Oral)  Resp 16  Ht 5\' 2"  (1.575 m)  Wt 115 lb (52.164 kg)  BMI 21.03 kg/m2  SpO2 100%  .Intake/Output      01/02 0701 - 01/03 0700 01/03 0701 - 01/04 0700   I.V. (mL/kg)  3646.8 (69.9)   Blood  454   NG/GT  30   IV Piggyback  1520   Total Intake(mL/kg)  5650.8 (108.3)   Urine (mL/kg/hr)  1765 (3)   Blood  1100 (1.9)   Chest Tube  480 (0.8)   Total Output   3345   Net   +2305.8          . sodium chloride    . [START ON 07/23/2015] sodium chloride    . sodium chloride 20 mL/hr (07/22/15 1315)  . sodium chloride 100 mL/hr (07/22/15 1315)  . dexmedetomidine Stopped (07/22/15 1636)  . insulin (NOVOLIN-R) infusion Stopped (07/22/15 1430)  . lactated ringers Stopped (07/22/15 1400)  . lactated ringers 20 mL/hr at 07/22/15 1800  . nitroGLYCERIN Stopped (07/22/15 1315)  . phenylephrine (NEO-SYNEPHRINE) Adult infusion 45 mcg/min (07/22/15 1812)     Lab Results  Component Value Date   WBC 16.6* 07/22/2015   HGB 9.5* 07/22/2015   HCT 28.0* 07/22/2015   PLT 116* 07/22/2015   GLUCOSE 87 07/22/2015   ALT 23 07/17/2015   AST 25 07/17/2015   NA 142 07/22/2015   K 3.6 07/22/2015   CL 105 07/22/2015   CREATININE 0.20* 07/22/2015   BUN 10 07/22/2015   CO2 24 07/17/2015   INR 1.62* 07/22/2015   HGBA1C 5.8* 07/17/2015   Now extubated, neuro intact Not bleeding Stable hemodynamics   Delight Ovens MD  Beeper [phone] Office [phone] 07/22/2015 6:20 PM

## 2015-07-22 NOTE — Progress Notes (Signed)
07/22/2015 1820 Dr. Tyrone Sage at bedside and made aware of difficulty maintaining desired MAP, all 4 Albumin given and Neo at 45 mcg. Verbal orders received ok to administer one more Albumin x 1. Orders enacted. Will continue to closely monitor patient.  Yisell Sprunger, Blanchard Kelch

## 2015-07-23 ENCOUNTER — Encounter (HOSPITAL_COMMUNITY): Payer: Self-pay | Admitting: Thoracic Surgery (Cardiothoracic Vascular Surgery)

## 2015-07-23 ENCOUNTER — Inpatient Hospital Stay (HOSPITAL_COMMUNITY): Payer: BLUE CROSS/BLUE SHIELD

## 2015-07-23 LAB — POCT I-STAT, CHEM 8
BUN: 13 mg/dL (ref 6–20)
CALCIUM ION: 1.21 mmol/L (ref 1.12–1.23)
CREATININE: 0.4 mg/dL — AB (ref 0.44–1.00)
Chloride: 103 mmol/L (ref 101–111)
GLUCOSE: 123 mg/dL — AB (ref 65–99)
HCT: 28 % — ABNORMAL LOW (ref 36.0–46.0)
HEMOGLOBIN: 9.5 g/dL — AB (ref 12.0–15.0)
POTASSIUM: 4.7 mmol/L (ref 3.5–5.1)
Sodium: 137 mmol/L (ref 135–145)
TCO2: 23 mmol/L (ref 0–100)

## 2015-07-23 LAB — CREATININE, SERUM
Creatinine, Ser: 0.35 mg/dL — ABNORMAL LOW (ref 0.44–1.00)
GFR calc non Af Amer: >60 mL/min (ref >60)

## 2015-07-23 LAB — GLUCOSE, CAPILLARY
GLUCOSE-CAPILLARY: 104 mg/dL — AB (ref 65–99)
GLUCOSE-CAPILLARY: 109 mg/dL — AB (ref 65–99)
GLUCOSE-CAPILLARY: 113 mg/dL — AB (ref 65–99)
GLUCOSE-CAPILLARY: 114 mg/dL — AB (ref 65–99)
GLUCOSE-CAPILLARY: 120 mg/dL — AB (ref 65–99)
GLUCOSE-CAPILLARY: 146 mg/dL — AB (ref 65–99)
Glucose-Capillary: 122 mg/dL — ABNORMAL HIGH (ref 65–99)

## 2015-07-23 LAB — POCT I-STAT 7, (LYTES, BLD GAS, ICA,H+H)
ACID-BASE DEFICIT: 1 mmol/L (ref 0.0–2.0)
BICARBONATE: 24.1 meq/L — AB (ref 20.0–24.0)
CALCIUM ION: 1.25 mmol/L — AB (ref 1.12–1.23)
HCT: 33 % — ABNORMAL LOW (ref 36.0–46.0)
Hemoglobin: 11.2 g/dL — ABNORMAL LOW (ref 12.0–15.0)
O2 Saturation: 72 %
PO2 ART: 36 mmHg — AB (ref 80.0–100.0)
Potassium: 3.5 mmol/L (ref 3.5–5.1)
Sodium: 138 mmol/L (ref 135–145)
TCO2: 25 mmol/L (ref 0–100)
pCO2 arterial: 39 mmHg (ref 35.0–45.0)
pH, Arterial: 7.394 (ref 7.350–7.450)

## 2015-07-23 LAB — POCT I-STAT 3, ART BLOOD GAS (G3+)
Acid-base deficit: 3 mmol/L — ABNORMAL HIGH (ref 0.0–2.0)
Bicarbonate: 22.6 mEq/L (ref 20.0–24.0)
O2 SAT: 99 %
PCO2 ART: 41.1 mmHg (ref 35.0–45.0)
PH ART: 7.349 — AB (ref 7.350–7.450)
PO2 ART: 147 mmHg — AB (ref 80.0–100.0)
Patient temperature: 37
TCO2: 24 mmol/L (ref 0–100)

## 2015-07-23 LAB — CBC
HCT: 26 % — ABNORMAL LOW (ref 36.0–46.0)
HEMATOCRIT: 25.6 % — AB (ref 36.0–46.0)
Hemoglobin: 8.7 g/dL — ABNORMAL LOW (ref 12.0–15.0)
Hemoglobin: 8.7 g/dL — ABNORMAL LOW (ref 12.0–15.0)
MCH: 32.3 pg (ref 26.0–34.0)
MCH: 31.5 pg (ref 26.0–34.0)
MCHC: 34 g/dL (ref 30.0–36.0)
MCHC: 33.5 g/dL (ref 30.0–36.0)
MCV: 95.2 fL (ref 78.0–100.0)
MCV: 94.2 fL (ref 78.0–100.0)
Platelets: 120 10*3/uL — ABNORMAL LOW (ref 150–400)
Platelets: 121 K/uL — ABNORMAL LOW (ref 150–400)
RBC: 2.69 MIL/uL — AB (ref 3.87–5.11)
RBC: 2.76 MIL/uL — ABNORMAL LOW (ref 3.87–5.11)
RDW: 12.4 % (ref 11.5–15.5)
RDW: 12.5 % (ref 11.5–15.5)
WBC: 14.1 10*3/uL — AB (ref 4.0–10.5)
WBC: 16.5 K/uL — ABNORMAL HIGH (ref 4.0–10.5)

## 2015-07-23 LAB — BASIC METABOLIC PANEL
ANION GAP: 6 (ref 5–15)
BUN: 9 mg/dL (ref 6–20)
CO2: 23 mmol/L (ref 22–32)
Calcium: 8.1 mg/dL — ABNORMAL LOW (ref 8.9–10.3)
Chloride: 110 mmol/L (ref 101–111)
Creatinine, Ser: 0.55 mg/dL (ref 0.44–1.00)
GFR calc Af Amer: >60 mL/min (ref >60)
GLUCOSE: 130 mg/dL — AB (ref 65–99)
POTASSIUM: 4.5 mmol/L (ref 3.5–5.1)
Sodium: 139 mmol/L (ref 135–145)

## 2015-07-23 LAB — POCT I-STAT EG7
ACID-BASE DEFICIT: 8 mmol/L — AB (ref 0.0–2.0)
BICARBONATE: 16.7 meq/L — AB (ref 20.0–24.0)
CALCIUM ION: 1.17 mmol/L (ref 1.12–1.23)
HEMATOCRIT: 22 % — AB (ref 36.0–46.0)
Hemoglobin: 7.5 g/dL — ABNORMAL LOW (ref 12.0–15.0)
O2 SAT: 67 %
PH VEN: 7.354 — AB (ref 7.250–7.300)
PO2 VEN: 33 mmHg (ref 30.0–45.0)
Patient temperature: 35.9
Potassium: 3.5 mmol/L (ref 3.5–5.1)
SODIUM: 130 mmol/L — AB (ref 135–145)
TCO2: 18 mmol/L (ref 0–100)
pCO2, Ven: 29.7 mmHg — ABNORMAL LOW (ref 45.0–50.0)

## 2015-07-23 LAB — MAGNESIUM
Magnesium: 2.3 mg/dL (ref 1.7–2.4)
Magnesium: 2.3 mg/dL (ref 1.7–2.4)

## 2015-07-23 MED ORDER — KETOROLAC TROMETHAMINE 15 MG/ML IJ SOLN
15.0000 mg | Freq: Four times a day (QID) | INTRAMUSCULAR | Status: AC
  Administered 2015-07-23 – 2015-07-24 (×5): 15 mg via INTRAVENOUS
  Filled 2015-07-23 (×5): qty 1

## 2015-07-23 MED ORDER — MORPHINE SULFATE (PF) 2 MG/ML IV SOLN: 1.0000 mg | INTRAVENOUS | Status: DC | PRN

## 2015-07-23 MED ORDER — SODIUM CHLORIDE 0.9 % IV SOLN
250.0000 mL | INTRAVENOUS | Status: DC
  Administered 2015-07-24: 250 mL via INTRAVENOUS

## 2015-07-23 MED ORDER — WARFARIN - PHYSICIAN DOSING INPATIENT: Freq: Every day | Status: DC

## 2015-07-23 MED ORDER — METOCLOPRAMIDE HCL 5 MG/ML IJ SOLN
10.0000 mg | Freq: Four times a day (QID) | INTRAMUSCULAR | Status: DC
  Administered 2015-07-23 – 2015-07-24 (×3): 10 mg via INTRAVENOUS
  Filled 2015-07-23 (×3): qty 2

## 2015-07-23 MED ORDER — METOCLOPRAMIDE HCL 5 MG/ML IJ SOLN
10.0000 mg | Freq: Once | INTRAMUSCULAR | Status: AC
  Administered 2015-07-23: 10 mg via INTRAVENOUS
  Filled 2015-07-23: qty 2

## 2015-07-23 MED ORDER — WARFARIN SODIUM 2.5 MG PO TABS
2.5000 mg | ORAL_TABLET | Freq: Every day | ORAL | Status: DC
  Administered 2015-07-23 – 2015-07-24 (×2): 2.5 mg via ORAL
  Filled 2015-07-23 (×2): qty 1

## 2015-07-23 MED FILL — Sodium Chloride IV Soln 0.9%: INTRAVENOUS | Qty: 2000 | Status: AC

## 2015-07-23 MED FILL — Heparin Sodium (Porcine) Inj 1000 Unit/ML: INTRAMUSCULAR | Qty: 10 | Status: AC

## 2015-07-23 MED FILL — Electrolyte-R (PH 7.4) Solution: INTRAVENOUS | Qty: 3000 | Status: AC

## 2015-07-23 MED FILL — Sodium Bicarbonate IV Soln 8.4%: INTRAVENOUS | Qty: 50 | Status: AC

## 2015-07-23 MED FILL — Mannitol IV Soln 20%: INTRAVENOUS | Qty: 500 | Status: AC

## 2015-07-23 MED FILL — Lidocaine HCl IV Inj 20 MG/ML: INTRAVENOUS | Qty: 5 | Status: AC

## 2015-07-23 NOTE — Progress Notes (Signed)
Utilization review completed.  

## 2015-07-23 NOTE — Addendum Note (Signed)
Addendum  created 07/23/15 4562 by Kipp Brood, MD   Modules edited: Notes Section   Notes Section:  File: [id number]; Pend: [id number]; Pend: [id number]; Pend: [id number]

## 2015-07-23 NOTE — Progress Notes (Signed)
Anesthesiology Follow-up:  Awake and alert, neuro intact, having incisional pain with movement and deep breathing. Still requiring dual chamber pacing with slow ventricular rhythm underneath, otherwise hemodynamically stable.  VS: T-37.6 BP-114/53 HR 80 (DDD paced) RR-17 O2 sat 100% on 2L  PA- 31/14 CO/CI- 2.7/1.8  K-4.5 BUN/Cr- 9/0.55 glucose-130 H/H- 8.7/25.6 Plts- 120,000  Extubated yesterday at 18:00, 4.5 hours post-op.   POD #1 following MV repair and closure of PDA, still requiring dual chamber pacing otherwise doing well.  Kipp Brood

## 2015-07-23 NOTE — Progress Notes (Signed)
[address]       [place] [address]             [phone]        CARDIOTHORACIC SURGERY PROGRESS NOTE   R1 Day Post-Op Procedure(s) (LRB): MITRAL VALVE REPAIR (N/A) TRANSESOPHAGEAL ECHOCARDIOGRAM (TEE) (N/A) PATENT DUCTUS ARTERIOSUS (PDA) ligation (N/A) CLIPPING OF LEFT ATRIAL APPENDAGE  Subjective: Feels sore in chest.  No SOB  Objective: Vital signs: BP Readings from Last 1 Encounters:  07/23/15 110/55   Pulse Readings from Last 1 Encounters:  07/23/15 80   Resp Readings from Last 1 Encounters:  07/23/15 17   Temp Readings from Last 1 Encounters:  07/23/15 99.3 F (37.4 C) Core (Comment)    Hemodynamics: PAP: (27-51)/(9-33) 38/18 mmHg CO:  [2 L/min-3.4 L/min] 2.7 L/min CI:  [1.4 L/min/m2-2.3 L/min/m2] 1.8 L/min/m2  Physical Exam:  Rhythm:   Junctional 50's - AAI pacing  Breath sounds: Diminished at bases, otherwise clear  Heart sounds:  RRR w/out murmur  Incisions:  Dressing dry and intact  Abdomen:  Soft, non-distended, non-tender  Extremities:  Warm, well-perfused  Chest tubes:  Decreasing volume thin serosanguinous output, no air leak    Intake/Output from previous day: 01/03 0701 - 01/04 0700 In: 7673.4 [I.V.:5169.4; Blood:454; NG/GT:30; IV Piggyback:2020] Out: 4310 [Urine:2350; Blood:1100; Chest Tube:860] Intake/Output this shift: Total I/O In: 70 [I.V.:70] Out: 165 [Urine:75; Chest Tube:90]  Lab Results:  CBC: Recent Labs  07/22/15 2117 07/23/15 0340  WBC 12.0* 14.1*  HGB 8.0* 8.7*  HCT 24.4* 25.6*  PLT 111* 120*    BMET:  Recent Labs  07/22/15 2018 07/22/15 2117 07/23/15 0340  NA 140  --  139  K 4.8  --  4.5  CL 108  --  110  CO2  --   --  23  GLUCOSE 160*  --  130*  BUN 10  --  9  CREATININE 0.30* 0.34* 0.55  CALCIUM  --   --  8.1*     PT/INR:   Recent Labs  07/22/15 1320  LABPROT 19.2*  INR 1.62*    CBG (last 3)   Recent Labs  07/22/15 2012 07/22/15 2349 07/23/15 0341  GLUCAP  166* 146* 120*    ABG    Component Value Date/Time   PHART 7.349* 07/23/2015 0350   PCO2ART 41.1 07/23/2015 0350   PO2ART 147.0* 07/23/2015 0350   HCO3 22.6 07/23/2015 0350   TCO2 24 07/23/2015 0350   ACIDBASEDEF 3.0* 07/23/2015 0350   O2SAT 99.0 07/23/2015 0350    CXR: PORTABLE CHEST 1 VIEW  COMPARISON: 07/22/2015  FINDINGS: Patient has been extubated. Swan-Ganz catheter tip positioning stable in the proximal right pulmonary artery. Mediastinal drain and left chest tube remain. No pneumothorax. Mild increase in bilateral pleural effusions and bibasilar atelectasis since the prior exam. Stable mild interstitial edema.  IMPRESSION: Mild increase in bilateral pleural effusions and bibasilar atelectasis. Mild interstitial edema appears stable. No pneumothorax identified.   Electronically Signed  By: Irish Lack M.D.  On: 07/23/2015 08:12  Assessment/Plan: S/P Procedure(s) (LRB): MITRAL VALVE REPAIR (N/A) TRANSESOPHAGEAL ECHOCARDIOGRAM (TEE) (N/A) PATENT DUCTUS ARTERIOSUS (PDA) ligation (N/A) CLIPPING OF LEFT ATRIAL APPENDAGE  Overall doing well POD1 Maintaining stable AAI paced rhythm and hemodynamics on low dose Neo for BP support Bradycardic under pacer w/ adequate escape rhythm Expected post op acute blood loss anemia, stable Expected post op atelectasis, mild Chronic combined systolic and diastolic CHF with expected post-op volume excess, mild Post op  thrombocytopenia, mild   Mobilize  D/C swan-ganz  Wean Neo as tolerated  Hold diuretics until BP improved off Neo  Leave chest tubes in for now - possibly d/c later today or tomorrow  Start coumadin slowly   Purcell Nails, MD 07/23/2015 9:05 AM

## 2015-07-23 NOTE — Progress Notes (Signed)
      [address]       [place] [address]             [phone]      POD # 1 MVR, ligation of PDA  Resting comfortably  BP 92/66 mmHg  Pulse 89  Temp(Src) 97.5 F (36.4 C) (Oral)  Resp 12  Ht 5\' 2"  (1.575 m)  Wt 129 lb 14.4 oz (58.922 kg)  BMI 23.75 kg/m2  SpO2 100%   Intake/Output Summary (Last 24 hours) at 07/23/15 1802 Last data filed at 07/23/15 1700  Gross per 24 hour  Intake 2783.31 ml  Output   1635 ml  Net 1148.31 ml    Still on low dose neo  Cr= 0.35 Hct= 26  Doing well POD # 1  Ezra Denne C. Dorris Fetch, MD [facility] [phone]

## 2015-07-24 ENCOUNTER — Inpatient Hospital Stay (HOSPITAL_COMMUNITY): Payer: BLUE CROSS/BLUE SHIELD

## 2015-07-24 LAB — BASIC METABOLIC PANEL
ANION GAP: 5 (ref 5–15)
BUN: 15 mg/dL (ref 6–20)
CHLORIDE: 105 mmol/L (ref 101–111)
CO2: 24 mmol/L (ref 22–32)
CREATININE: 0.4 mg/dL — AB (ref 0.44–1.00)
Calcium: 8.3 mg/dL — ABNORMAL LOW (ref 8.9–10.3)
GFR calc non Af Amer: >60 mL/min (ref >60)
Glucose, Bld: 118 mg/dL — ABNORMAL HIGH (ref 65–99)
POTASSIUM: 4.7 mmol/L (ref 3.5–5.1)
SODIUM: 134 mmol/L — AB (ref 135–145)

## 2015-07-24 LAB — CBC
HCT: 23 % — ABNORMAL LOW (ref 36.0–46.0)
HEMOGLOBIN: 7.7 g/dL — AB (ref 12.0–15.0)
MCH: 32 pg (ref 26.0–34.0)
MCHC: 33.5 g/dL (ref 30.0–36.0)
MCV: 95.4 fL (ref 78.0–100.0)
PLATELETS: 109 10*3/uL — AB (ref 150–400)
RBC: 2.41 MIL/uL — AB (ref 3.87–5.11)
RDW: 12.7 % (ref 11.5–15.5)
WBC: 15.2 10*3/uL — AB (ref 4.0–10.5)

## 2015-07-24 LAB — PREPARE RBC (CROSSMATCH)

## 2015-07-24 LAB — PROTIME-INR
INR: 1.59 — ABNORMAL HIGH (ref 0.00–1.49)
Prothrombin Time: 19 seconds — ABNORMAL HIGH (ref 11.6–15.2)

## 2015-07-24 LAB — GLUCOSE, CAPILLARY
GLUCOSE-CAPILLARY: 98 mg/dL (ref 65–99)
GLUCOSE-CAPILLARY: 98 mg/dL (ref 65–99)

## 2015-07-24 MED ORDER — SODIUM CHLORIDE 0.9 % IJ SOLN
3.0000 mL | Freq: Two times a day (BID) | INTRAMUSCULAR | Status: DC
  Administered 2015-07-24 – 2015-07-27 (×7): 3 mL via INTRAVENOUS

## 2015-07-24 MED ORDER — SODIUM CHLORIDE 0.9 % IV SOLN: 250.0000 mL | INTRAVENOUS | Status: DC | PRN

## 2015-07-24 MED ORDER — SODIUM CHLORIDE 0.9 % IJ SOLN: 3.0000 mL | INTRAMUSCULAR | Status: DC | PRN

## 2015-07-24 MED ORDER — MOVING RIGHT ALONG BOOK
Freq: Once | Status: AC
  Administered 2015-07-24: 09:00:00
  Filled 2015-07-24: qty 1

## 2015-07-24 MED ORDER — SODIUM CHLORIDE 0.9 % IV SOLN
Freq: Once | INTRAVENOUS | Status: AC
  Administered 2015-07-24: 11:00:00 via INTRAVENOUS

## 2015-07-24 MED ORDER — ENOXAPARIN SODIUM 30 MG/0.3ML ~~LOC~~ SOLN
30.0000 mg | SUBCUTANEOUS | Status: DC
  Administered 2015-07-25: 30 mg via SUBCUTANEOUS
  Filled 2015-07-24 (×2): qty 0.3

## 2015-07-24 MED ORDER — ASPIRIN EC 81 MG PO TBEC
81.0000 mg | DELAYED_RELEASE_TABLET | Freq: Every day | ORAL | Status: DC
  Administered 2015-07-24 – 2015-07-28 (×5): 81 mg via ORAL
  Filled 2015-07-24 (×5): qty 1

## 2015-07-24 NOTE — Progress Notes (Signed)
[name], female   DOB: 02-26-1961, 55 y.o.   MRN: [id number]  SICU Evening Rounds:  Hemodynamically stable off neo. AAI paced  Ambulated 200 ft.   Nausea today.

## 2015-07-24 NOTE — Progress Notes (Signed)
[address]       [name] [address]             [phone]        CARDIOTHORACIC SURGERY PROGRESS NOTE   R2 Days Post-Op Procedure(s) (LRB): MITRAL VALVE REPAIR (N/A) TRANSESOPHAGEAL ECHOCARDIOGRAM (TEE) (N/A) PATENT DUCTUS ARTERIOSUS (PDA) ligation (N/A) CLIPPING OF LEFT ATRIAL APPENDAGE  Subjective: Still feels nauseated.  Soreness in chest improved but complains of pain with taking deep breaths  Objective: Vital signs: BP Readings from Last 1 Encounters:  07/24/15 90/50   Pulse Readings from Last 1 Encounters:  07/24/15 89   Resp Readings from Last 1 Encounters:  07/24/15 13   Temp Readings from Last 1 Encounters:  07/24/15 97.4 F (36.3 C) Oral    Hemodynamics: PAP: (35-36)/(16-18) 36/18 mmHg CO:  [3 L/min] 3 L/min CI:  [2 L/min/m2] 2 L/min/m2  Physical Exam:  Rhythm:   Junctional 40's - AAI pacing  Breath sounds: Diminished at bases, otherwise clear  Heart sounds:  RRR  Incisions:  Dressing dry, intact  Abdomen:  Soft, non-distended, non-tender  Extremities:  Warm, well-perfused  Chest tubes:  Low volume thin serosanguinous output, no air leak    Intake/Output from previous day: 01/04 0701 - 01/05 0700 In: 1002.3 [P.O.:100; I.V.:802.3; IV Piggyback:100] Out: 1250 [Urine:670; Chest Tube:580] Intake/Output this shift:    Lab Results:  CBC: Recent Labs  07/23/15 1609 07/24/15 0416  WBC 16.5* 15.2*  HGB 8.7* 7.7*  HCT 26.0* 23.0*  PLT 121* 109*    BMET:  Recent Labs  07/23/15 0340 07/23/15 1559 07/23/15 1609 07/24/15 0416  NA 139 137  --  134*  K 4.5 4.7  --  4.7  CL 110 103  --  105  CO2 23  --   --  24  GLUCOSE 130* 123*  --  118*  BUN 9 13  --  15  CREATININE 0.55 0.40* 0.35* 0.40*  CALCIUM 8.1*  --   --  8.3*     PT/INR:   Recent Labs  07/24/15 0416  LABPROT 19.0*  INR 1.59*    CBG (last 3)   Recent Labs  07/23/15 2335 07/24/15 0336 07/24/15 0738  GLUCAP 109* 98 98    ABG      Component Value Date/Time   PHART 7.349* 07/23/2015 0350   PCO2ART 41.1 07/23/2015 0350   PO2ART 147.0* 07/23/2015 0350   HCO3 22.6 07/23/2015 0350   TCO2 23 07/23/2015 1559   ACIDBASEDEF 3.0* 07/23/2015 0350   O2SAT 99.0 07/23/2015 0350    CXR: PORTABLE CHEST 1 VIEW  COMPARISON: 07/23/2015.  FINDINGS: Interim removal of Swan-Ganz catheter. Right IJ sheath in good anatomic position. Left chest tube in stable position. Mediastinal drainage catheters in stable position. Prior median sternotomy and cardiac valve replacement. Left atrial appendage clip noted. Cardiomegaly with with improvement of pulmonary interstitial prominence suggesting improving congestive heart failure. No prominent pleural effusion. No pneumothorax .  IMPRESSION: 1. Interim removal Swan-Ganz catheter. Remaining lines and tubes including left chest tube in stable position. No pneumothorax. 2. Prior mitral valve replacement. Cardiomegaly with partial clearing of pulmonary interstitial edema.   Electronically Signed  By: Maisie Fus Register  On: 07/24/2015 07:58   Assessment/Plan: S/P Procedure(s) (LRB): MITRAL VALVE REPAIR (N/A) TRANSESOPHAGEAL ECHOCARDIOGRAM (TEE) (N/A) PATENT DUCTUS ARTERIOSUS (PDA) ligation (N/A) CLIPPING OF LEFT ATRIAL APPENDAGE  Overall stable POD2 Maintaining stable AAI paced rhythm but still on low dose Neo for BP support Remains bradycardic  under pacer Expected post op acute blood loss anemia, Hgb has dropped to 7.7 this morning, patient symptomatic Expected post op atelectasis, stable Post op nausea, poor po intake - may be at least partially related to anemia  Post op thrombocytopenia, platelet count down slightly 109k   Will transfuse 1 unit PRBC's  Wean Neo as tolerated  Continue AAI pacing and hold beta blockers  D/C chest tubes and Foley  Mobilize  D/C central line and transfer step down once stable off Neo  Continue coumadin   Purcell Nails,  MD 07/24/2015 8:53 AM

## 2015-07-25 ENCOUNTER — Inpatient Hospital Stay (HOSPITAL_COMMUNITY): Payer: BLUE CROSS/BLUE SHIELD

## 2015-07-25 LAB — CBC
HEMATOCRIT: 27 % — AB (ref 36.0–46.0)
Hemoglobin: 9 g/dL — ABNORMAL LOW (ref 12.0–15.0)
MCH: 30.9 pg (ref 26.0–34.0)
MCHC: 33.3 g/dL (ref 30.0–36.0)
MCV: 92.8 fL (ref 78.0–100.0)
PLATELETS: 125 10*3/uL — AB (ref 150–400)
RBC: 2.91 MIL/uL — ABNORMAL LOW (ref 3.87–5.11)
RDW: 14.3 % (ref 11.5–15.5)
WBC: 14.4 10*3/uL — AB (ref 4.0–10.5)

## 2015-07-25 LAB — TYPE AND SCREEN
ABO/RH(D): A POS
Antibody Screen: NEGATIVE
UNIT DIVISION: 0

## 2015-07-25 LAB — BASIC METABOLIC PANEL
ANION GAP: 6 (ref 5–15)
BUN: 25 mg/dL — ABNORMAL HIGH (ref 6–20)
CALCIUM: 8.4 mg/dL — AB (ref 8.9–10.3)
CO2: 24 mmol/L (ref 22–32)
CREATININE: 0.63 mg/dL (ref 0.44–1.00)
Chloride: 103 mmol/L (ref 101–111)
GLUCOSE: 123 mg/dL — AB (ref 65–99)
Potassium: 4.9 mmol/L (ref 3.5–5.1)
Sodium: 133 mmol/L — ABNORMAL LOW (ref 135–145)

## 2015-07-25 LAB — PROTIME-INR
INR: 1.74 — AB (ref 0.00–1.49)
Prothrombin Time: 20.3 seconds — ABNORMAL HIGH (ref 11.6–15.2)

## 2015-07-25 MED ORDER — WARFARIN SODIUM 1 MG PO TABS
1.0000 mg | ORAL_TABLET | Freq: Every day | ORAL | Status: DC
  Administered 2015-07-25 – 2015-07-27 (×3): 1 mg via ORAL
  Filled 2015-07-25 (×4): qty 1

## 2015-07-25 MED ORDER — FUROSEMIDE 10 MG/ML IJ SOLN
20.0000 mg | Freq: Once | INTRAMUSCULAR | Status: AC
  Administered 2015-07-25: 20 mg via INTRAVENOUS
  Filled 2015-07-25: qty 2

## 2015-07-25 MED ORDER — MIDAZOLAM HCL 2 MG/2ML IJ SOLN: 1.0000 mg | Freq: Once | INTRAMUSCULAR | Status: DC

## 2015-07-25 NOTE — Care Management Note (Signed)
Case Management Note  Patient Details  Name: Diane [name] MRN: [id number] Date of Birth: [birth year]  Subjective/Objective:     CM received request to call sister-in-law, Gillermina Hu [phone]) to discuss discharge plans.  Pt lives alone and family may be unable to provide 24/7 care.  Pt will qualify for home health nurse for intermittent skilled visits, and CM provided information re private duty aide services.                             Expected Discharge Plan:  Home w Home Health Services  Discharge planning Services  CM Consult  Post Acute Care Choice:  Home Health  Status of Service:  In process, will continue to follow  Magdalene River, RN 07/25/2015, 2:37 PM

## 2015-07-25 NOTE — Progress Notes (Signed)
[address]       [place] [address]             [phone]        CARDIOTHORACIC SURGERY PROGRESS NOTE   R3 Days Post-Op Procedure(s) (LRB): MITRAL VALVE REPAIR (N/A) TRANSESOPHAGEAL ECHOCARDIOGRAM (TEE) (N/A) PATENT DUCTUS ARTERIOSUS (PDA) ligation (N/A) CLIPPING OF LEFT ATRIAL APPENDAGE  Subjective: Looks much better.  Ambulated 600 ft this morning.  Still nauseated.  Denies pain, SOB  Objective: Vital signs: BP Readings from Last 1 Encounters:  07/25/15 89/70   Pulse Readings from Last 1 Encounters:  07/25/15 79   Resp Readings from Last 1 Encounters:  07/25/15 24   Temp Readings from Last 1 Encounters:  07/25/15 98 F (36.7 C) Oral    Hemodynamics:    Physical Exam:  Rhythm:   Sinus brady - junctional w/ HR 40-50 - AAI pacing  Breath sounds: clear  Heart sounds:  RRR w/out murmur  Incisions:  Dressing dry, intact  Abdomen:  Soft, non-distended, non-tender  Extremities:  Warm, well-perfused    Intake/Output from previous day: 01/05 0701 - 01/06 0700 In: 440 [I.V.:140; Blood:300] Out: 45 [Urine:45] Intake/Output this shift:    Lab Results:  CBC: Recent Labs  07/24/15 0416 07/25/15 0425  WBC 15.2* 14.4*  HGB 7.7* 9.0*  HCT 23.0* 27.0*  PLT 109* 125*    BMET:  Recent Labs  07/24/15 0416 07/25/15 0425  NA 134* 133*  K 4.7 4.9  CL 105 103  CO2 24 24  GLUCOSE 118* 123*  BUN 15 25*  CREATININE 0.40* 0.63  CALCIUM 8.3* 8.4*     PT/INR:   Recent Labs  07/25/15 0425  LABPROT 20.3*  INR 1.74*    CBG (last 3)   Recent Labs  07/23/15 2335 07/24/15 0336 07/24/15 0738  GLUCAP 109* 98 98    ABG    Component Value Date/Time   PHART 7.349* 07/23/2015 0350   PCO2ART 41.1 07/23/2015 0350   PO2ART 147.0* 07/23/2015 0350   HCO3 22.6 07/23/2015 0350   TCO2 23 07/23/2015 1559   ACIDBASEDEF 3.0* 07/23/2015 0350   O2SAT 99.0 07/23/2015 0350    CXR: CHEST 2 VIEW  COMPARISON: Portable chest x-ray of  July 24, 2015  FINDINGS: The left lung is well-expanded. There is a tiny pleural effusion on the left inferiorly. On the right there is a small pleural effusion and mild basilar atelectasis. The heart is top-normal in size. The pulmonary vascularity is normal. A left atrial appendage clip and a prosthetic mitral valve ring are visible. There are 8 intact sternal wires. The mediastinum is normal in width. There is no pneumothorax.  IMPRESSION: Improved aeration and decreasing small bilateral pleural effusions. There is no pulmonary edema.   Electronically Signed  By: David [place]aziland M.D.  On: 07/25/2015 07:44  Assessment/Plan: S/P Procedure(s) (LRB): MITRAL VALVE REPAIR (N/A) TRANSESOPHAGEAL ECHOCARDIOGRAM (TEE) (N/A) PATENT DUCTUS ARTERIOSUS (PDA) ligation (N/A) CLIPPING OF LEFT ATRIAL APPENDAGE  Overall stable POD3 Maintaining stable AAI paced rhythm off Neo drip Remains bradycardic under pacer although HR improving some Expected post op acute blood loss anemia, Hgb up to 9.0 Expected post op atelectasis, improved Expected post op volume excess, mild, weight 6 kg above baseline Post op nausea - tolerating some water but still poor po intake Post op thrombocytopenia, platelet count up slightly 125k   Continue AAI pacing and hold beta blockers  Mobilize  Try 1 dose lasix  Continue coumadin but  decrease dose  Transfer step down  Anticipate possible d/c home 2-3 days if HR and po intake improve  Purcell Nails, MD 07/25/2015 8:36 AM

## 2015-07-26 LAB — BASIC METABOLIC PANEL
ANION GAP: 6 (ref 5–15)
BUN: 22 mg/dL — ABNORMAL HIGH (ref 6–20)
CHLORIDE: 98 mmol/L — AB (ref 101–111)
CO2: 27 mmol/L (ref 22–32)
Calcium: 8.4 mg/dL — ABNORMAL LOW (ref 8.9–10.3)
Creatinine, Ser: 0.56 mg/dL (ref 0.44–1.00)
GFR calc non Af Amer: >60 mL/min (ref >60)
GLUCOSE: 115 mg/dL — AB (ref 65–99)
Potassium: 4.4 mmol/L (ref 3.5–5.1)
Sodium: 131 mmol/L — ABNORMAL LOW (ref 135–145)

## 2015-07-26 LAB — PROTIME-INR
INR: 1.91 — ABNORMAL HIGH (ref 0.00–1.49)
PROTHROMBIN TIME: 21.8 s — AB (ref 11.6–15.2)

## 2015-07-26 LAB — CBC
HEMATOCRIT: 26.9 % — AB (ref 36.0–46.0)
HEMOGLOBIN: 8.9 g/dL — AB (ref 12.0–15.0)
MCH: 30.6 pg (ref 26.0–34.0)
MCHC: 33.1 g/dL (ref 30.0–36.0)
MCV: 92.4 fL (ref 78.0–100.0)
PLATELETS: 177 10*3/uL (ref 150–400)
RBC: 2.91 MIL/uL — AB (ref 3.87–5.11)
RDW: 13.6 % (ref 11.5–15.5)
WBC: 11.9 10*3/uL — ABNORMAL HIGH (ref 4.0–10.5)

## 2015-07-26 MED ORDER — WARFARIN VIDEO: Freq: Once | Status: DC

## 2015-07-26 MED ORDER — COUMADIN BOOK
Freq: Once | Status: DC
  Filled 2015-07-26: qty 1

## 2015-07-26 MED ORDER — INFLUENZA VAC SPLIT QUAD 0.5 ML IM SUSY
0.5000 mL | PREFILLED_SYRINGE | INTRAMUSCULAR | Status: DC
  Filled 2015-07-26: qty 0.5

## 2015-07-26 MED ORDER — FUROSEMIDE 20 MG PO TABS
20.0000 mg | ORAL_TABLET | Freq: Every day | ORAL | Status: AC
  Administered 2015-07-26 – 2015-07-28 (×3): 20 mg via ORAL
  Filled 2015-07-26 (×3): qty 1

## 2015-07-26 NOTE — Progress Notes (Signed)
Pt ambulated approx. 1000 ft with stand by assist. Tolerated well. VSS. No new complaints. Pt back in room sitting in chair with call bell within reach. Will continue to monitor.

## 2015-07-26 NOTE — Progress Notes (Signed)
CARDIAC REHAB PHASE I   PRE:  Rate/Rhythm: 86 sinus  BP:  Standing: 118/72   SaO2: 100 room air  MODE:  Ambulation: 890 ft   POST:  Rate/Rhythem: 87 sinus  BP:  Sitting: 126/64       Pt ambulated 890 ft with assist x1.  Pt tolerated walk well no complaints.  VSS. Eager to go home.  Completed d/c education: diet, exercise guideline, ISP use, splinting/coughing, restrictions, and when to call MD /911.  Also reviewed Cardiac Rehab Phase II with referral sent to [place].  Pt voiced understanding.  Fabio Pierce, MA, ACSM RCEP  Juanetta Gosling, Doy Mince

## 2015-07-26 NOTE — Progress Notes (Addendum)
[address]       [place] [address]             [phone]      4 Days Post-Op Procedure(s) (LRB): MITRAL VALVE REPAIR (N/A) TRANSESOPHAGEAL ECHOCARDIOGRAM (TEE) (N/A) PATENT DUCTUS ARTERIOSUS (PDA) ligation (N/A) CLIPPING OF LEFT ATRIAL APPENDAGE Subjective: Feels fairly well, fair appetite with mild nausea  Objective: Vital signs in last 24 hours: Temp:  [98.1 F (36.7 C)-98.4 F (36.9 C)] 98.2 F (36.8 C) (01/07 0547) Pulse Rate:  [76-84] 84 (01/07 0547) Cardiac Rhythm:  [-] Atrial paced (01/06 1900) Resp:  [12-21] 12 (01/07 0547) BP: (86-135)/(52-77) 135/59 mmHg (01/07 0547) SpO2:  [92 %-100 %] 95 % (01/07 0547) Weight:  [130 lb 11.7 oz (59.3 kg)] 130 lb 11.7 oz (59.3 kg) (01/07 0547)  Hemodynamic parameters for last 24 hours:    Intake/Output from previous day: 01/06 0701 - 01/07 0700 In: 350 [P.O.:350] Out: -  Intake/Output this shift:    General appearance: alert, cooperative and no distress Heart: regular rate and rhythm and no murmur Lungs: dim in right base Abdomen: benign Extremities: minor edema Wound: incis healing well  Lab Results:  Recent Labs  07/25/15 0425 07/26/15 0604  WBC 14.4* 11.9*  HGB 9.0* 8.9*  HCT 27.0* 26.9*  PLT 125* 177   BMET:  Recent Labs  07/25/15 0425 07/26/15 0604  NA 133* 131*  K 4.9 4.4  CL 103 98*  CO2 24 27  GLUCOSE 123* 115*  BUN 25* 22*  CREATININE 0.63 0.56  CALCIUM 8.4* 8.4*    PT/INR:  Recent Labs  07/26/15 0604  LABPROT 21.8*  INR 1.91*   ABG    Component Value Date/Time   PHART 7.349* 07/23/2015 0350   HCO3 22.6 07/23/2015 0350   TCO2 23 07/23/2015 1559   ACIDBASEDEF 3.0* 07/23/2015 0350   O2SAT 99.0 07/23/2015 0350   CBG (last 3)   Recent Labs  07/23/15 2335 07/24/15 0336 07/24/15 0738  GLUCAP 109* 98 98    Meds Scheduled Meds: . acetaminophen  1,000 mg Oral 4 times per day  . aspirin EC  81 mg Oral Daily  . bisacodyl  10 mg Oral Daily   Or  .  bisacodyl  10 mg Rectal Daily  . docusate sodium  200 mg Oral Daily  . enoxaparin (LOVENOX) injection  30 mg Subcutaneous Q24H  . midazolam  1 mg Intravenous Once  . pantoprazole  40 mg Oral Daily  . sodium chloride  3 mL Intravenous Q12H  . sodium chloride  3 mL Intravenous Q12H  . warfarin  1 mg Oral q1800  . Warfarin - Physician Dosing Inpatient   Does not apply q1800   Continuous Infusions: . sodium chloride 250 mL (07/24/15 1400)   PRN Meds:.sodium chloride, metoprolol, ondansetron (ZOFRAN) IV, sodium chloride, sodium chloride, traMADol  Xrays Dg Chest 2 View  07/25/2015  CLINICAL DATA:  Atelectasis, shortness of breath, CHF, status post mitral valve repair and ligation of PDA EXAM: CHEST  2 VIEW COMPARISON:  Portable chest x-ray of July 24, 2015 FINDINGS: The left lung is well-expanded. There is a tiny pleural effusion on the left inferiorly. On the right there is a small pleural effusion and mild basilar atelectasis. The heart is top-normal in size. The pulmonary vascularity is normal. A left atrial appendage clip and a prosthetic mitral valve ring are visible. There are 8 intact sternal wires. The mediastinum is normal in width. There is  no pneumothorax. IMPRESSION: Improved aeration and decreasing small bilateral pleural effusions. There is no pulmonary edema. Electronically Signed   By: David  [place] M.D.   On: 07/25/2015 07:44    Assessment/Plan: S/P Procedure(s) (LRB): MITRAL VALVE REPAIR (N/A) TRANSESOPHAGEAL ECHOCARDIOGRAM (TEE) (N/A) PATENT DUCTUS ARTERIOSUS (PDA) ligation (N/A) CLIPPING OF LEFT ATRIAL APPENDAGE  1 overall doing well 2 in sinus with rate mostly in 80's 3 INR rising on 1 mg dose of coumadin- continue same for now 4 leukocytosis improved 5 H/H stable, platelets cont to improve 6 will diurese further 7 pulm toilet and increase activity as able    LOS: 4 days    GOLD,WAYNE E 07/26/2015  I have seen and examined the patient and agree with the  assessment and plan as outlined.  Looks good.  Maintaining NSR.  Possible d/c home 1-2 days.  Purcell Nails, MD 07/26/2015 11:18 AM

## 2015-07-27 LAB — PROTIME-INR
INR: 1.95 — ABNORMAL HIGH (ref 0.00–1.49)
Prothrombin Time: 22.1 seconds — ABNORMAL HIGH (ref 11.6–15.2)

## 2015-07-27 NOTE — Discharge Summary (Signed)
Physician Discharge Summary  [name] MRN: [id number] DOB/AGE: [birth year] 55 y.o.  Admit date: 07/22/2015 Discharge date: 07/28/2015  Admission Diagnoses: Mitral Regurgitation  Discharge Diagnoses:  Principal Problem:   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 Active Problems:   Shortness of breath   Severe mitral regurgitation   Chronic combined systolic and diastolic CHF (congestive heart failure) (HCC)   Patent ductus arteriosus  Patient Active Problem List   Diagnosis Date Noted  . 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 Patent ductus arteriosus 07/16/2015  . Preoperative testing 07/07/2015  . Severe mitral regurgitation 07/03/2015  . Chronic combined systolic and diastolic CHF (congestive heart failure) (HCC) 07/03/2015  . Shortness of breath 05/29/2015  . Facial spasm 01/01/2015  . Faintness 01/01/2015    HPI:  Patient is a 55 year old female with recently discovered mitral regurgitation, chronic combined systolic and diastolic congestive heart failure, and syncope who has been referred for surgical consultation to discuss treatment options for management of severe symptomatic mitral regurgitation. The patient describes a several history year history of persistent dry nonproductive cough. Over the past 1-1/2-2 years she has developed progressive exertional shortness of breath. More recently she has experienced worsening shortness of breath with frequent dizzy spells and at least 2 syncopal episodes. She was evaluated in the emergency department where her blood pressure and heart rate were normal. Cardiac enzymes were negative. She has been followed intermittently by her primary care physician and referred to a neurologist. MRI of the brain and EEG were both unremarkable. She was referred for cardiology consultation and evaluated by Dr. Duke Salvia on 05/29/2015. Transthoracic echocardiogram revealed  moderate left ventricular systolic dysfunction with ejection fraction estimated 45-50%. There was severe mitral regurgitation with findings suggestive of restriction of the posterior leaflet. There was flow reversal in the pulmonary veins. There was severe left atrial enlargement. Transesophageal echocardiogram confirmed the presence of at least moderate to severe mitral regurgitation and also suggested possible restriction of the posterior leaflet. Left heart catheterization was performed 07/08/2015 by Dr. Herbie Baltimore. The patient was found have normal coronary arteries with no significant coronary artery disease. There was mild to moderate left ventricular systolic dysfunction with ejection fraction estimated 40-45%. There was severe (4+) mitral regurgitation. Pulmonary artery pressures were not reported but the patient was noted to have moderate to severely reduced cardiac output and large V waves on PCWP waveform. The patient was referred for surgical consultation.  The patient is originally from [place] but has been living locally in [place] for the past 14 years. She is currently single and previously divorced. 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 She has 2 adult children. Her son is in the [employer] and stationed in [place]. Her daughter lives in [place]. The patient states that she had been physically active all of her life. Over the past 1-2 years she has developed progressive exertional shortness breath with intermittent chest tightness. She now gets short of breath with minimal activity and frequently at rest. She cannot lay flat in bed. She has frequent dizzy spells and suffered several syncopal episodes. The 2 syncopal episodes she recalls both occurred while she was physically active. She states that now when she is active and she begins to feel dizzy she simply stops what she is doing and she recovers without coming close to passing out. She has not had  lower extremity edema. She has a persistent  dry nonproductive cough that has been present for nearly 4 years. Sometimes she states that her coughing spells or so severe that she develops chest wall pain. She denies history of hemoptysis. She denies any known history of rheumatic fever.           She was admitted for the procedure.    Discharged Condition: good  Hospital Course: The patient was admitted electively for the below described procedure. Postoperatively she has progressed nicely. tolerated well was taken to the surgical intensive care unit in stable condition. She has had some postoperative heart block and rhythm has been monitored closely AND HAS STABILIZED. Marland Kitchen She had some postoperative volume overload which has improved over time with diuresis. She has an expected acute blood loss anemia which is stable. She had postoperative thrombocytopenia but platelets have stabilized. She has been started on oral Coumadin dosing and INR has been monitored closely. She is tolerating gradually increasing activities using standard postoperative protocols. Incisions are healing well without evidence of infection. She is tolerating diet. Oxygen has been weaned and she maintains good saturations on room air. Currently her status is felt to be tentatively stable for discharge in the next 24-48 hours pending ongoing reevaluation of her progress.   Consults: None  Significant Diagnostic Studies: routine post-op labs and xrays   Procedures: CARDIOTHORACIC SURGERY OPERATIVE NOTE  Date of Procedure:07/22/2015  Preoperative Diagnosis:  Severe Mitral Regurgitation  Patent Ductus Arteriosus  Postoperative Diagnosis:Same  Procedure:   Mitral Valve Repair Sorin Memo 3D ring annuloplasty (size 26mm, catalog #[id number], serial #[id number])   Ligation of Patent Ductus Arteriosus   Clipping of Left Atrial Appendage Atricure Atriclip  pro-1 clip (size 35mm)   Surgeon:Clarence H. Cornelius Moras, MD  Assistant:Erin Barrett, PA-C  Anesthesia:David Noreene Larsson, MD  Operative Findings:  Type I mitral valve dysfunction with moderate-severe mitral regurgitation  Patent ductus arteriosus  Low-normal LV systolic function, EF 50%  No residual mitral regurgitation after successful valve repair Treatments: surgery:   Discharge Exam: Blood pressure 133/73, pulse 98, temperature 97.8 F (36.6 C), temperature source Oral, resp. rate 18, height 5\' 2"  (1.575 m), weight 129 lb (58.514 kg), SpO2 100 %.    General appearance: alert, cooperative and no distress Heart: regular rate and rhythm and no murmur Lungs: clear to auscultation bilaterally Abdomen: soft, nontender Extremities: minor edema Wound: incis healing well  Disposition: 01-Home or Self Care  Discharge Instructions    Amb Referral to Cardiac Rehabilitation    Complete by:  As directed   Diagnosis:  Valve Replacement/Repair            Medications at time of discharge:   Medication List    TAKE these medications        aspirin 81 MG EC tablet  Take 1 tablet (81 mg total) by mouth daily.     multivitamin tablet  Take 1 tablet by mouth daily.     naproxen sodium 220 MG tablet  Commonly known as:  ANAPROX  Take 220 mg by mouth 2 (two) times daily as needed (for pain).     psyllium 0.52 g capsule  Commonly known as:  REGULOID  Take 0.52 g by mouth daily.     traMADol 50 MG tablet  Commonly known as:  ULTRAM  Take 1-2 tablets (50-100 mg total) by mouth every 6 (six) hours as needed for moderate pain.     warfarin 1 MG tablet  Commonly known as:  COUMADIN  Take 2 tablets (2 mg total) by  mouth daily at 6 PM.        Follow-up Information    Follow up with Purcell Nails, MD.   Specialty:  Cardiothoracic Surgery   Why:  the office will contact you for appointment in 2 weeks. Please also obtain a chest xray at [facility] imaging which is  located in the same office complex    Contact information:   [address] [place] [address] [phone]       Follow up with Madilyn Hook, MD.   Specialty:  Cardiology   Why:  office will contact you for appt in 2 weeks for follow-up with cardiologist.    Contact information:   [address] [place] [address] [phone]       Follow up with [facility].   Specialty:  Cardiology   Why:  follow-up blood test will be arranged for you to adjust coumadin dosing   Contact information:   [address] [place] [address] [phone]      Signed: Rowe Clack 07/28/2015, 12:38 PM

## 2015-07-27 NOTE — Progress Notes (Addendum)
[address]       [employer] [address]             [phone]      5 Days Post-Op Procedure(s) (LRB): MITRAL VALVE REPAIR (N/A) TRANSESOPHAGEAL ECHOCARDIOGRAM (TEE) (N/A) PATENT DUCTUS ARTERIOSUS (PDA) ligation (N/A) CLIPPING OF LEFT ATRIAL APPENDAGE Subjective: Feels tired, but conts to feel better overall  Objective: Vital signs in last 24 hours: Temp:  [98.2 F (36.8 C)-98.7 F (37.1 C)] 98.2 F (36.8 C) (01/08 0455) Pulse Rate:  [84-88] 88 (01/08 0455) Cardiac Rhythm:  [-] Normal sinus rhythm (01/08 0700) Resp:  [16-18] 16 (01/08 0455) BP: (96-133)/(54-68) 96/68 mmHg (01/08 0455) SpO2:  [99 %-100 %] 99 % (01/08 0455) Weight:  [128 lb 4.8 oz (58.196 kg)] 128 lb 4.8 oz (58.196 kg) (01/08 0455)  Hemodynamic parameters for last 24 hours:    Intake/Output from previous day: 01/07 0701 - 01/08 0700 In: 60 [P.O.:60] Out: -  Intake/Output this shift:    General appearance: alert, cooperative and no distress Heart: regular rate and rhythm and no murmur Lungs: clear to auscultation bilaterally Abdomen: minor distension, soft, non-tender Extremities: minor edema Wound: incis healing well  Lab Results:  Recent Labs  07/25/15 0425 07/26/15 0604  WBC 14.4* 11.9*  HGB 9.0* 8.9*  HCT 27.0* 26.9*  PLT 125* 177   BMET:  Recent Labs  07/25/15 0425 07/26/15 0604  NA 133* 131*  K 4.9 4.4  CL 103 98*  CO2 24 27  GLUCOSE 123* 115*  BUN 25* 22*  CREATININE 0.63 0.56  CALCIUM 8.4* 8.4*    PT/INR:  Recent Labs  07/27/15 0300  LABPROT 22.1*  INR 1.95*   ABG    Component Value Date/Time   PHART 7.349* 07/23/2015 0350   HCO3 22.6 07/23/2015 0350   TCO2 23 07/23/2015 1559   ACIDBASEDEF 3.0* 07/23/2015 0350   O2SAT 99.0 07/23/2015 0350   CBG (last 3)  No results for input(s): GLUCAP in the last 72 hours.  Meds Scheduled Meds: . acetaminophen  1,000 mg Oral 4 times per day  . aspirin EC  81 mg Oral Daily  . bisacodyl  10 mg Oral  Daily   Or  . bisacodyl  10 mg Rectal Daily  . coumadin book   Does not apply Once  . docusate sodium  200 mg Oral Daily  . furosemide  20 mg Oral Daily  . Influenza vac split quadrivalent PF  0.5 mL Intramuscular Tomorrow-1000  . midazolam  1 mg Intravenous Once  . pantoprazole  40 mg Oral Daily  . sodium chloride  3 mL Intravenous Q12H  . sodium chloride  3 mL Intravenous Q12H  . warfarin  1 mg Oral q1800  . warfarin   Does not apply Once  . Warfarin - Physician Dosing Inpatient   Does not apply q1800   Continuous Infusions: . sodium chloride 250 mL (07/24/15 1400)    PRN Meds:.sodium chloride, metoprolol, ondansetron (ZOFRAN) IV, sodium chloride, sodium chloride, traMADol  Xrays No results found.  Assessment/Plan: S/P Procedure(s) (LRB): MITRAL VALVE REPAIR (N/A) TRANSESOPHAGEAL ECHOCARDIOGRAM (TEE) (N/A) PATENT DUCTUS ARTERIOSUS (PDA) ligation (N/A) CLIPPING OF LEFT ATRIAL APPENDAGE  1 she is progressing nicely overall 2 Having some intermitt heart block, rate is controlled, cont to monitor, epw's have not been removed yet 3 cont gentle diuresis 4 cont current coumadin dosing 5     LOS: 5 days    GOLD,WAYNE E 07/27/2015  I have  seen and examined the patient and agree with the assessment and plan as outlined.  Remains in sinus rhythm but has had a couple of brief episodes 2nd degree AV block - will continue to hold beta blockers and observe.  Hopefully home 1-2 days if rhythm stable/improved.  Otherwise doing well.  Purcell Nails, MD 07/27/2015 11:23 AM

## 2015-07-27 NOTE — Discharge Instructions (Signed)
Mitral Valve Replacement/RepairWarfarin: What You Need to Know Warfarin is an anticoagulant. Anticoagulants help prevent the formation of blood clots. They also help stop the growth of blood clots. Warfarin is sometimes referred to as a "blood thinner."  Normally, when body tissues are cut or damaged, the blood clots in order to prevent blood loss. Sometimes clots form inside your blood vessels and obstruct the flow of blood through your circulatory system (thrombosis). These clots may travel through your bloodstream and become lodged in smaller blood vessels in your brain, which can cause a stroke, or in your lungs (pulmonary embolism). WHO SHOULD USE WARFARIN? Warfarin is prescribed for people at risk of developing harmful blood clots:  People with surgically implanted mechanical heart valves, irregular heart rhythms called atrial fibrillation, and certain clotting disorders.  People who have developed harmful blood clotting in the past, including those who have had a stroke or a pulmonary embolism, or thrombosis in their legs (deep vein thrombosis [DVT]).  People with an existing blood clot, such as a pulmonary embolism. WARFARIN DOSING Warfarin tablets come in different strengths. Each tablet strength is a different color, with the amount of warfarin (in milligrams) clearly printed on the tablet. If the color of your tablet is different than usual when you receive a new prescription, report it immediately to your pharmacist or health care provider. WARFARIN MONITORING The goal of warfarin therapy is to lessen the clotting tendency of blood but not prevent clotting completely. Your health care provider will monitor the anticoagulation effect of warfarin closely and adjust your dose as needed. For your safety, blood tests called prothrombin time (PT) or international normalized ratio (INR) are used to measure the effects of warfarin. Both of these tests can be done with a finger stick or a blood  draw. The longer it takes the blood to clot, the higher the PT or INR. Your health care provider will inform you of your "target" PT or INR range. If, at any time, your PT or INR is above the target range, there is a risk of bleeding. If your PT or INR is below the target range, there is a risk of clotting. Whether you are started on warfarin while you are in the hospital or in your health care provider's office, you will need to have your PT or INR checked within one week of starting the medicine. Initially, some people are asked to have their PT or INR checked as much as twice a week. Once you are on a stable maintenance dose, the PT or INR is checked less often, usually once every 2 to 4 weeks. The warfarin dose may be adjusted if the PT or INR is not within the target range. It is important to keep all laboratory and health care provider follow-up appointments. Not keeping appointments could result in a chronic or permanent injury, pain, or disability because warfarin is a medicine that requires close monitoring. WHAT ARE THE SIDE EFFECTS OF WARFARIN?  Too much warfarin can cause bleeding (hemorrhage) from any part of the body. This may include bleeding from the gums, blood in the urine, bloody or dark stools, a nosebleed that is not easily stopped, coughing up blood, or vomiting blood.  Too little warfarin can increase the risk of blood clots.  Too little or too much warfarin can also increase the risk of a stroke.  Warfarin use may cause a skin rash or irritation, an unusual fever, continual nausea or stomach upset, or severe pain in your joints  or back. SPECIAL PRECAUTIONS WHILE TAKING WARFARIN Warfarin should be taken exactly as directed. It is very important to take warfarin as directed since bleeding or blood clots could result in chronic or permanent injury, pain, or disability.  Take your medicine at the same time every day. If you forget to take your dose, you can take it if it is within  6 hours of when it was due.  Do not change the dose of warfarin on your own to make up for missed or extra doses.  If you miss more than 2 doses in a row, you should contact your health care provider for advice. Avoid situations that cause bleeding. You may have a tendency to bleed more easily than usual while taking warfarin. The following actions can limit bleeding:  Using a softer toothbrush.  Flossing with waxed floss rather than unwaxed floss.  Shaving with an Neurosurgeon rather than a blade.  Limiting the use of sharp objects.  Avoiding potentially harmful activities, such as contact sports. Warfarin and Pregnancy or Breastfeeding  Warfarin is not advised during the first trimester of pregnancy due to an increased risk of birth defects. In certain situations, a woman may take warfarin after her first trimester of pregnancy. A woman who becomes pregnant or plans to become pregnant while taking warfarin should notify her health care provider immediately.  Although warfarin does not pass into breast milk, a woman who wishes to breastfeed while taking warfarin should also consult with her health care provider. Alcohol, Smoking, and Illicit Drug Use  Alcohol affects how warfarin works in the body. It is best to avoid alcoholic drinks or consume very small amounts while taking warfarin. In general, alcohol intake should be limited to 1 oz (30 mL) of liquor, 6 oz (180 mL) of wine, or 12 oz (360 mL) of beer each day. Notify your health care provider if you change your alcohol intake.  Smoking affects how warfarin works. It is best to avoid smoking while taking warfarin. Notify your health care provider if you change your smoking habits.  It is best to avoid all illicit drugs while taking warfarin since there are few studies that show how warfarin interacts with these drugs. Other Medicines and Dietary Supplements Many prescription and over-the-counter medicines can interfere with  warfarin. Be sure all of your health care providers know you are taking warfarin. Notify your health care provider who prescribed warfarin for you or your pharmacist before starting or stopping any new medicines, including over-the-counter vitamins, dietary supplements, and pain medicines. Your warfarin dose may need to be adjusted. Some common over-the-counter medicines that may increase the risk of bleeding while taking warfarin include:   Acetaminophen.  Aspirin.  Nonsteroidal anti-inflammatory medicines (NSAIDs), such as ibuprofen or naproxen.  Vitamin E. Dietary Considerations  Foods that have moderate or high amounts of vitamin K can interfere with warfarin. Avoid major changes in your diet or notify your health care provider before changing your diet. Eat a consistent amount of foods that have moderate or high amounts of vitamin K. Eating less foods containing vitamin K can increase the risk of bleeding. Eating more foods containing vitamin K can increase the risk of blood clots. Additional questions about dietary considerations can be discussed with a dietitian. Foods that are very high in vitamin K:  Greens, such as Swiss chard and beet, collard, mustard, or turnip greens (fresh or frozen, cooked).  Kale (fresh or frozen, cooked).  Parsley (raw).  Spinach (cooked). Foods that  are high in vitamin K:  Asparagus (frozen, cooked).  Broccoli.  Bok choy (cooked).  Brussels sprouts (fresh or frozen, cooked).  Cabbage (cooked).   Coleslaw. Foods that are moderately high in vitamin K:  Blueberries.  Black-eyed peas.  Endive (raw).  Green leaf lettuce (raw).  Green scallions (raw).  Kale (raw).  Okra (frozen, cooked).  Plantains (fried).  Romaine lettuce (raw).  Sauerkraut (canned).  Spinach (raw). CALL YOUR CLINIC OR HEALTH CARE PROVIDER IF YOU:  Plan to have any surgery or procedure.  Feel sick, especially if you have diarrhea or vomiting.  Experience  or anticipate any major changes in your diet.  Start or stop a prescription or over-the-counter medicine.  Become, plan to become, or think you may be pregnant.  Are having heavier than usual menstrual periods.  Have had a fall, accident, or any symptoms of bleeding or unusual bruising.  Develop an unusual fever. CALL 911 IN THE [place] OR GO TO THE EMERGENCY DEPARTMENT IF YOU:   Think you may be having an allergic reaction to warfarin. The signs of an allergic reaction could include itching, rash, hives, swelling, chest tightness, or trouble breathing.  See signs of blood in your urine. The signs could include reddish, pinkish, or tea-colored urine.  See signs of blood in your stools. The signs could include bright red or black stools.  Vomit or cough up blood. In these instances, the blood could have either a bright red or a "coffee-grounds" appearance.  Have bleeding that will not stop after applying pressure for 30 minutes such as cuts, nosebleeds, or other injuries.  Have severe pain in your joints or back.  Have a new and severe headache.  Have sudden weakness or numbness of your face, arm, or leg, especially on one side of your body.  Have sudden confusion or trouble understanding.  Have sudden trouble seeing in one or both eyes.  Have sudden trouble walking, dizziness, loss of balance, or coordination.  Have trouble speaking or understanding (aphasia).   This information is not intended to replace advice given to you by your health care provider. Make sure you discuss any questions you have with your health care provider.   Document Released: 07/05/2005 Document Revised: 07/26/2014 Document Reviewed: 12/29/2012 Elsevier Interactive Patient Education [employer] , Care After Refer to this sheet in the next few weeks. These instructions provide you with information on caring for yourself after your procedure. Your health care provider may also give you specific  instructions. Your treatment has been planned according to current medical practices, but problems sometimes occur. Call your health care provider if you have any problems or questions after your procedure.  HOME CARE INSTRUCTIONS   Take medicines only as directed by your health care provider.  Take your temperature every morning for the first 7 days after surgery. Write these down.  Weigh yourself every morning for at least 7 days after surgery. Write your weight down.  Wear elastic stockings during the day for at least 2 weeks after surgery or as directed by your health care provider. Use them longer if your ankles are swollen. The stockings help blood flow and help reduce swelling in the legs.  Take frequent naps or rest often throughout the day.  Avoid lifting more than 10 lb (4.5 kg) or pushing or pulling things with your arms for 6-8 weeks or as directed by your health care provider.  Avoid driving or airplane travel for 4-6 weeks after surgery or as  directed. If you are riding in a car for an extended period, stop every 1-2 hours to stretch your legs.  Avoid crossing your legs.  Avoid climbing stairs and using the handrail to pull yourself up for the first 2-3 weeks after surgery.  Do not take baths for 2-4 weeks after surgery. Take showers once your health care provider approves. Pat incisions dry. Do not rub incisions with a washcloth or towel.  Return to work as directed by your health care provider.  Drink enough fluids to keep your urine clear or pale yellow.  Do not strain to have a bowel movement. Eat high-fiber foods if you become constipated. You may also take a medicine to help you have a bowel movement (laxative) as directed by your health care provider.  Resume sexual activity as directed by your health care provider. SEEK MEDICAL CARE IF:   You develop a skin rash.   Your weight is increasing each day over 2-3 days.  Your weight increases by 2 or more pounds  (1 kg) in a single day.  You have a fever. SEEK IMMEDIATE MEDICAL CARE IF:   You develop chest pain that is not coming from your incision.  You develop shortness of breath or difficulty breathing.  You have drainage, redness, swelling, or pain at your incision site.  You have pus coming from your incision.  You develop light-headedness. MAKE SURE YOU:  Understand these directions.  Will watch your condition.  Will get help right away if you are not doing well or get worse.   This information is not intended to replace advice given to you by your health care provider. Make sure you discuss any questions you have with your health care provider.   Document Released: 01/22/2005 Document Revised: 07/26/2014 Document Reviewed: 12/05/2012 Elsevier Interactive Patient Education [employer]

## 2015-07-28 LAB — PROTIME-INR
INR: 1.72 — AB (ref 0.00–1.49)
PROTHROMBIN TIME: 20.2 s — AB (ref 11.6–15.2)

## 2015-07-28 MED ORDER — WARFARIN SODIUM 2 MG PO TABS: 2.0000 mg | ORAL_TABLET | Freq: Every day | ORAL | Status: DC

## 2015-07-28 MED ORDER — WARFARIN SODIUM 2.5 MG PO TABS: 2.5000 mg | ORAL_TABLET | Freq: Every day | ORAL | Status: DC

## 2015-07-28 MED ORDER — TRAMADOL HCL 50 MG PO TABS: 50.0000 mg | ORAL_TABLET | Freq: Four times a day (QID) | ORAL | Status: DC | PRN

## 2015-07-28 MED ORDER — ASPIRIN 81 MG PO TBEC: 81.0000 mg | DELAYED_RELEASE_TABLET | Freq: Every day | ORAL | Status: DC

## 2015-07-28 MED ORDER — WARFARIN SODIUM 1 MG PO TABS: 2.0000 mg | ORAL_TABLET | Freq: Every day | ORAL | Status: DC

## 2015-07-28 NOTE — Progress Notes (Signed)
Pt up ambulating around the unit. No complaints at this time. RN will continue to monitor.  Roselie Awkward, RN

## 2015-07-28 NOTE — Care Management Note (Signed)
Case Management Note Previous CM note initiated by [facility] RN, CM  Patient Details  Name: Inga Ahlstrom MRN: [id number] Date of Birth: 1960-12-12  Subjective/Objective:     CM received request to call sister-in-law, Gillermina Hu [phone]) to discuss discharge plans.  Pt lives alone and family may be unable to provide 24/7 care.  Pt will qualify for home health nurse for intermittent skilled visits, and CM provided information re private duty aide services.                              Expected Discharge Date:      07/28/15            Expected Discharge Plan:  Home w Home Health Services  In-House Referral:     Discharge planning Services  CM Consult  Post Acute Care Choice:  Home Health Choice offered to:     DME Arranged:    DME Agency:     HH Arranged:    HH Agency:     Status of Service:  Completed, signed off  Medicare Important Message Given:    Date Medicare IM Given:    Medicare IM give by:    Date Additional Medicare IM Given:    Additional Medicare Important Message give by:     If discussed at Long Length of Stay Meetings, dates discussed:    Discharge Disposition: Home/self care   Additional Comments:  07/28/15- Pt for d/c home today, no HH orders written  Darrold Span, RN 07/28/2015, 1:41 PM

## 2015-07-28 NOTE — Progress Notes (Signed)
EPW d/c'd per order and per protocol. Tips intact. VS stable. Pt tolerated procedure well. Pt educated on need for VS q15 min and bedrest for 1h. Call bell and phone within reach. Will continue to monitor.

## 2015-07-28 NOTE — Progress Notes (Signed)
CARDIAC REHAB PHASE I   PRE:  Rate/Rhythm: 94 SR    BP: sitting 110/80    SaO2: 100 RA  MODE:  Ambulation: 1240 ft   POST:  Rate/Rhythm: 99 SR with occ PAC, HR up to 105 ST sitting in bed after walk    BP: sitting 129/59     SaO2: 99 RA  Pt moving very well, supervision assist. HR maintained around 95 SR entire walk with occasional PAC. Pt asymptomatic. She did have x1 short feeling of lightheadedness, resolved with a few seconds of rest.  HR did not increase much until she sat down after walking long distance, then up to 105 ST. Pt sts she can feel her HR being higher since she is used to having a low HR. Otherwise doing well. Eager to do CRPII. [id number]  Elissa Lovett [place] CES, ACSM 07/28/2015 9:14 AM

## 2015-07-28 NOTE — Progress Notes (Addendum)
[address]       [place] [address]             [phone]      6 Days Post-Op Procedure(s) (LRB): MITRAL VALVE REPAIR (N/A) TRANSESOPHAGEAL ECHOCARDIOGRAM (TEE) (N/A) PATENT DUCTUS ARTERIOSUS (PDA) ligation (N/A) CLIPPING OF LEFT ATRIAL APPENDAGE Subjective: Feels fairly well overall, no new issues. Having some short episodes of 2 deg block, occ pvc's. Rate is well controlled  Objective: Vital signs in last 24 hours: Temp:  [97.8 F (36.6 C)-98.4 F (36.9 C)] 97.8 F (36.6 C) (01/09 0559) Pulse Rate:  [93-98] 98 (01/09 0559) Cardiac Rhythm:  [-] Normal sinus rhythm (01/08 1900) Resp:  [17-18] 18 (01/09 0559) BP: (104-130)/(44-85) 130/44 mmHg (01/09 0559) SpO2:  [99 %-100 %] 100 % (01/09 0559) Weight:  [129 lb (58.514 kg)] 129 lb (58.514 kg) (01/09 0559)  Hemodynamic parameters for last 24 hours:    Intake/Output from previous day: 01/08 0701 - 01/09 0700 In: 480 [P.O.:480] Out: -  Intake/Output this shift:    General appearance: alert, cooperative and no distress Heart: regular rate and rhythm and no murmur Lungs: clear to auscultation bilaterally Abdomen: soft, nontender Extremities: minor edema Wound: incis healing well  Lab Results:  Recent Labs  07/26/15 0604  WBC 11.9*  HGB 8.9*  HCT 26.9*  PLT 177   BMET:  Recent Labs  07/26/15 0604  NA 131*  K 4.4  CL 98*  CO2 27  GLUCOSE 115*  BUN 22*  CREATININE 0.56  CALCIUM 8.4*    PT/INR:  Recent Labs  07/28/15 0551  LABPROT 20.2*  INR 1.72*   ABG    Component Value Date/Time   PHART 7.349* 07/23/2015 0350   HCO3 22.6 07/23/2015 0350   TCO2 23 07/23/2015 1559   ACIDBASEDEF 3.0* 07/23/2015 0350   O2SAT 99.0 07/23/2015 0350   CBG (last 3)  No results for input(s): GLUCAP in the last 72 hours.  Meds Scheduled Meds: . aspirin EC  81 mg Oral Daily  . bisacodyl  10 mg Oral Daily   Or  . bisacodyl  10 mg Rectal Daily  . coumadin book   Does not apply Once  .  docusate sodium  200 mg Oral Daily  . furosemide  20 mg Oral Daily  . Influenza vac split quadrivalent PF  0.5 mL Intramuscular Tomorrow-1000  . midazolam  1 mg Intravenous Once  . pantoprazole  40 mg Oral Daily  . sodium chloride  3 mL Intravenous Q12H  . sodium chloride  3 mL Intravenous Q12H  . warfarin  1 mg Oral q1800  . warfarin   Does not apply Once  . Warfarin - Physician Dosing Inpatient   Does not apply q1800   Continuous Infusions: . sodium chloride 250 mL (07/24/15 1400)   PRN Meds:.sodium chloride, metoprolol, ondansetron (ZOFRAN) IV, sodium chloride, sodium chloride, traMADol  Xrays No results found.  Assessment/Plan: S/P Procedure(s) (LRB): MITRAL VALVE REPAIR (N/A) TRANSESOPHAGEAL ECHOCARDIOGRAM (TEE) (N/A) PATENT DUCTUS ARTERIOSUS (PDA) ligation (N/A) CLIPPING OF LEFT ATRIAL APPENDAGE   1 conts to do well clinically 2 will increase coumadin dose a little 3 will need epw's out when rhythm felt to be stable enough to remove    LOS: 6 days    GOLD,WAYNE E 07/28/2015  I have seen and examined the patient and agree with the assessment and plan as outlined.  No symptomatic bradycardia.  Rhythm stable with 1 very brief non-sustained episode of  2nd degree AV block last 24 hours.  D/C pacing wires and D/C home.  Coumadin 2 mg/day plus low dose ASA.  No beta blocker.  Stop lasix.    Purcell Nails, MD 07/28/2015 9:35 AM

## 2015-07-30 ENCOUNTER — Encounter: Payer: BLUE CROSS/BLUE SHIELD | Admitting: Pharmacist Clinician (PhC)/ Clinical Pharmacy Specialist

## 2015-07-31 ENCOUNTER — Ambulatory Visit
Admission: RE | Admit: 2015-07-31 | Discharge: 2015-07-31 | Disposition: A | Discharge status: Home / Self Care | Payer: BLUE CROSS/BLUE SHIELD | Source: Ambulatory Visit | Attending: Thoracic Surgery (Cardiothoracic Vascular Surgery) | Admitting: Thoracic Surgery (Cardiothoracic Vascular Surgery)

## 2015-07-31 ENCOUNTER — Ambulatory Visit (INDEPENDENT_AMBULATORY_CARE_PROVIDER_SITE_OTHER): Payer: Self-pay | Admitting: Thoracic Surgery (Cardiothoracic Vascular Surgery)

## 2015-07-31 ENCOUNTER — Other Ambulatory Visit: Payer: Self-pay

## 2015-07-31 ENCOUNTER — Ambulatory Visit (INDEPENDENT_AMBULATORY_CARE_PROVIDER_SITE_OTHER): Payer: BLUE CROSS/BLUE SHIELD | Admitting: Pharmacist Clinician (PhC)/ Clinical Pharmacy Specialist

## 2015-07-31 ENCOUNTER — Encounter: Payer: Self-pay | Admitting: Thoracic Surgery (Cardiothoracic Vascular Surgery)

## 2015-07-31 VITALS — BP 99/57 | HR 95 | Resp 18 | Ht 62.0 in | Wt 128.0 lb

## 2015-07-31 DIAGNOSIS — Z8774 Personal history of (corrected) congenital malformations of heart and circulatory system: Secondary | ICD-10-CM

## 2015-07-31 DIAGNOSIS — R0600 Dyspnea, unspecified: Secondary | ICD-10-CM

## 2015-07-31 DIAGNOSIS — Z9889 Other specified postprocedural states: Secondary | ICD-10-CM | POA: Diagnosis not present

## 2015-07-31 DIAGNOSIS — Z7901 Long term (current) use of anticoagulants: Secondary | ICD-10-CM | POA: Diagnosis not present

## 2015-07-31 DIAGNOSIS — I5043 Acute on chronic combined systolic (congestive) and diastolic (congestive) heart failure: Secondary | ICD-10-CM

## 2015-07-31 DIAGNOSIS — Q250 Patent ductus arteriosus: Secondary | ICD-10-CM

## 2015-07-31 DIAGNOSIS — I5042 Chronic combined systolic (congestive) and diastolic (congestive) heart failure: Secondary | ICD-10-CM

## 2015-07-31 DIAGNOSIS — I340 Nonrheumatic mitral (valve) insufficiency: Secondary | ICD-10-CM

## 2015-07-31 LAB — POCT INR: INR: 1.9

## 2015-07-31 MED ORDER — METOPROLOL TARTRATE 25 MG PO TABS: 12.5000 mg | ORAL_TABLET | Freq: Two times a day (BID) | ORAL | Status: DC

## 2015-07-31 MED ORDER — POTASSIUM CHLORIDE CRYS ER 10 MEQ PO TBCR: 10.0000 meq | EXTENDED_RELEASE_TABLET | Freq: Every day | ORAL | Status: DC

## 2015-07-31 MED ORDER — FUROSEMIDE 20 MG PO TABS: 20.0000 mg | ORAL_TABLET | Freq: Every day | ORAL | Status: DC

## 2015-07-31 NOTE — Progress Notes (Signed)
[address]       [place] [address]             [phone]     CARDIOTHORACIC SURGERY OFFICE NOTE  Referring Provider is Chilton Si, MD PCP is Emeterio Reeve, MD   HPI:  Patient returns to the office today for follow-up status post mitral valve repair, ligation of patent ductus arteriosus, and clipping of left atrial appendage on 07/22/2015 for severe symptomatic mitral regurgitation with patent ductus arteriosus. Her early postoperative recovery in the hospital was notable for prolonged postoperative nausea and anorexia with poor oral intake as well as long first-degree AV block with occasional periods of second-degree AV block. Her heart rhythm gradually improved and she never had any periods of symptomatic bradycardia. She was discharged from the hospital on the eighth postoperative day. Because of her postoperative AV block she was not discharged from the hospital on a beta blocker. Her oral intake had improved prior to hospital discharge, but she was not discharged on a diuretic due to marginal intake with euvolemic volumes status. She called our office this morning stating that over the last several days she has gained a few pounds in weight. This has been associated with some degree of increased shortness of breath and lower extremity swelling. She states that she otherwise feels well and her appetite has improved considerably. She admits that she has been eating foods associated with high sodium content. She has mild residual soreness in her chest and has not been taking pain relievers. Her prothrombin time was checked this morning at the Coumadin clinic and her INR was reported 1.9. She is scheduled to return for Coumadin and check next week.   Current Outpatient Prescriptions  Medication Sig Dispense Refill  . aspirin EC 81 MG EC tablet Take 1 tablet (81 mg total) by mouth daily.    . Multiple Vitamin (MULTIVITAMIN) tablet Take 1 tablet by mouth  daily.    . naproxen sodium (ANAPROX) 220 MG tablet Take 220 mg by mouth 2 (two) times daily as needed (for pain).    . psyllium (REGULOID) 0.52 G capsule Take 0.52 g by mouth daily.    . traMADol (ULTRAM) 50 MG tablet Take 1-2 tablets (50-100 mg total) by mouth every 6 (six) hours as needed for moderate pain. 50 tablet 0  . warfarin (COUMADIN) 1 MG tablet Take 2 tablets (2 mg total) by mouth daily at 6 PM. 100 tablet 1   No current facility-administered medications for this visit.      Physical Exam:   BP 99/57 mmHg  Pulse 95  Resp 18  Ht 5\' 2"  (1.575 m)  Wt 128 lb (58.06 kg)  BMI 23.41 kg/m2  SpO2 98%  General:  Well-appearing  Chest:   Diminished at both lung bases  CV:   Regular rate and rhythm without murmur  Incisions:  Clean and dry and healing nicely, sternum is stable  Abdomen:  Soft and nontender  Extremities:  Warm and well perfused with mild bilateral lower extremity edema  Diagnostic Tests:  CHEST 2 VIEW  COMPARISON: 07/25/2015  FINDINGS:  Increased moderate left pleural effusion is seen. Small right  pleural effusion shows no significant change. There is increased  atelectasis or infiltrate in the left lower lung. No pneumothorax  visualized. Previous median sternotomy and mitral valve replacement  noted.  IMPRESSION:  Increased moderate left pleural effusion and left lower lobe  atelectasis versus infiltrate.  No significant change  in small right pleural effusion.  Electronically Signed  By: Myles Rosenthal M.D.  On: 07/31/2015 15:51    Impression:  Patient has acute exacerbation of chronic combined systolic and diastolic congestive heart failure with shortness of breath, mild lower extremity edema and several pound weight gain.  Chest x-ray reveals increased left lower lobe atelectasis and possible small to moderate-sized left pleural effusion.  The patient is somewhat tachycardic.    Plan:  I have started low-dose beta blocker and given the patient a  prescription for both Lasix and potassium. She will continue to monitor her weight on a daily basis and call to return to see [place] if she continues to gain weight and/or if her breathing does not improve. We will otherwise plan to see her back for routine follow-up with repeat chest x-ray in 2 weeks. She has been advised to avoid foods with high sodium content.    Salvatore Decent. Cornelius Moras, MD 07/31/2015 4:11 PM

## 2015-07-31 NOTE — Patient Instructions (Signed)
Begin taking lasix (fluid pill) and potassium daily.  Take your first dose today then take every morning after that.  Begin taking metoprolol (Lopressor) twice daily.  Continue to record your weight every morning.  Avoid foods containing salt (sodium).  Call and return to our office if your weight continues to go up and/or your breathing doesn't improve.

## 2015-08-05 ENCOUNTER — Telehealth: Payer: Self-pay | Admitting: Cardiovascular Disease

## 2015-08-05 ENCOUNTER — Inpatient Hospital Stay (HOSPITAL_COMMUNITY): Payer: BLUE CROSS/BLUE SHIELD

## 2015-08-05 ENCOUNTER — Encounter (HOSPITAL_COMMUNITY): Payer: Self-pay | Admitting: Radiology

## 2015-08-05 ENCOUNTER — Emergency Department (HOSPITAL_COMMUNITY): Payer: BLUE CROSS/BLUE SHIELD

## 2015-08-05 ENCOUNTER — Inpatient Hospital Stay (HOSPITAL_COMMUNITY)
Admission: EM | Admit: 2015-08-05 | Discharge: 2015-08-29 | DRG: 242 | Disposition: A | Discharge status: Skilled Nursing Facility | Payer: BLUE CROSS/BLUE SHIELD | Attending: Internal Medicine | Admitting: Internal Medicine

## 2015-08-05 DIAGNOSIS — I4891 Unspecified atrial fibrillation: Secondary | ICD-10-CM

## 2015-08-05 DIAGNOSIS — I951 Orthostatic hypotension: Secondary | ICD-10-CM | POA: Diagnosis not present

## 2015-08-05 DIAGNOSIS — J9811 Atelectasis: Secondary | ICD-10-CM

## 2015-08-05 DIAGNOSIS — I340 Nonrheumatic mitral (valve) insufficiency: Secondary | ICD-10-CM

## 2015-08-05 DIAGNOSIS — Z79899 Other long term (current) drug therapy: Secondary | ICD-10-CM | POA: Diagnosis not present

## 2015-08-05 DIAGNOSIS — T17908A Unspecified foreign body in respiratory tract, part unspecified causing other injury, initial encounter: Secondary | ICD-10-CM

## 2015-08-05 DIAGNOSIS — R092 Respiratory arrest: Secondary | ICD-10-CM

## 2015-08-05 DIAGNOSIS — I5021 Acute systolic (congestive) heart failure: Secondary | ICD-10-CM | POA: Diagnosis not present

## 2015-08-05 DIAGNOSIS — R7303 Prediabetes: Secondary | ICD-10-CM | POA: Diagnosis present

## 2015-08-05 DIAGNOSIS — Z7901 Long term (current) use of anticoagulants: Secondary | ICD-10-CM | POA: Diagnosis not present

## 2015-08-05 DIAGNOSIS — R402112 Coma scale, eyes open, never, at arrival to emergency department: Secondary | ICD-10-CM | POA: Diagnosis not present

## 2015-08-05 DIAGNOSIS — R1310 Dysphagia, unspecified: Secondary | ICD-10-CM | POA: Diagnosis present

## 2015-08-05 DIAGNOSIS — I480 Paroxysmal atrial fibrillation: Secondary | ICD-10-CM | POA: Diagnosis present

## 2015-08-05 DIAGNOSIS — I428 Other cardiomyopathies: Secondary | ICD-10-CM | POA: Diagnosis present

## 2015-08-05 DIAGNOSIS — E871 Hypo-osmolality and hyponatremia: Secondary | ICD-10-CM | POA: Diagnosis present

## 2015-08-05 DIAGNOSIS — D649 Anemia, unspecified: Secondary | ICD-10-CM | POA: Diagnosis present

## 2015-08-05 DIAGNOSIS — Z885 Allergy status to narcotic agent status: Secondary | ICD-10-CM | POA: Diagnosis not present

## 2015-08-05 DIAGNOSIS — R739 Hyperglycemia, unspecified: Secondary | ICD-10-CM | POA: Diagnosis present

## 2015-08-05 DIAGNOSIS — G931 Anoxic brain damage, not elsewhere classified: Secondary | ICD-10-CM | POA: Diagnosis present

## 2015-08-05 DIAGNOSIS — R570 Cardiogenic shock: Secondary | ICD-10-CM

## 2015-08-05 DIAGNOSIS — I447 Left bundle-branch block, unspecified: Secondary | ICD-10-CM | POA: Diagnosis present

## 2015-08-05 DIAGNOSIS — Z9889 Other specified postprocedural states: Secondary | ICD-10-CM | POA: Diagnosis not present

## 2015-08-05 DIAGNOSIS — R791 Abnormal coagulation profile: Secondary | ICD-10-CM | POA: Diagnosis present

## 2015-08-05 DIAGNOSIS — Z952 Presence of prosthetic heart valve: Secondary | ICD-10-CM | POA: Diagnosis not present

## 2015-08-05 DIAGNOSIS — I429 Cardiomyopathy, unspecified: Secondary | ICD-10-CM

## 2015-08-05 DIAGNOSIS — Z95818 Presence of other cardiac implants and grafts: Secondary | ICD-10-CM

## 2015-08-05 DIAGNOSIS — R402212 Coma scale, best verbal response, none, at arrival to emergency department: Secondary | ICD-10-CM | POA: Diagnosis present

## 2015-08-05 DIAGNOSIS — K7200 Acute and subacute hepatic failure without coma: Secondary | ICD-10-CM | POA: Diagnosis present

## 2015-08-05 DIAGNOSIS — J690 Pneumonitis due to inhalation of food and vomit: Secondary | ICD-10-CM | POA: Diagnosis present

## 2015-08-05 DIAGNOSIS — Z9911 Dependence on respirator [ventilator] status: Secondary | ICD-10-CM

## 2015-08-05 DIAGNOSIS — I4581 Long QT syndrome: Secondary | ICD-10-CM | POA: Diagnosis not present

## 2015-08-05 DIAGNOSIS — Z7982 Long term (current) use of aspirin: Secondary | ICD-10-CM

## 2015-08-05 DIAGNOSIS — I469 Cardiac arrest, cause unspecified: Secondary | ICD-10-CM

## 2015-08-05 DIAGNOSIS — J90 Pleural effusion, not elsewhere classified: Secondary | ICD-10-CM

## 2015-08-05 DIAGNOSIS — Z91048 Other nonmedicinal substance allergy status: Secondary | ICD-10-CM

## 2015-08-05 DIAGNOSIS — T80212A Local infection due to central venous catheter, initial encounter: Secondary | ICD-10-CM

## 2015-08-05 DIAGNOSIS — T17998D Other foreign object in respiratory tract, part unspecified causing other injury, subsequent encounter: Secondary | ICD-10-CM | POA: Diagnosis not present

## 2015-08-05 DIAGNOSIS — I5033 Acute on chronic diastolic (congestive) heart failure: Secondary | ICD-10-CM | POA: Insufficient documentation

## 2015-08-05 DIAGNOSIS — I472 Ventricular tachycardia: Secondary | ICD-10-CM | POA: Diagnosis present

## 2015-08-05 DIAGNOSIS — F329 Major depressive disorder, single episode, unspecified: Secondary | ICD-10-CM | POA: Diagnosis present

## 2015-08-05 DIAGNOSIS — I462 Cardiac arrest due to underlying cardiac condition: Secondary | ICD-10-CM | POA: Diagnosis not present

## 2015-08-05 DIAGNOSIS — I4729 Other ventricular tachycardia: Secondary | ICD-10-CM | POA: Insufficient documentation

## 2015-08-05 DIAGNOSIS — Z9289 Personal history of other medical treatment: Secondary | ICD-10-CM

## 2015-08-05 DIAGNOSIS — Q250 Patent ductus arteriosus: Secondary | ICD-10-CM

## 2015-08-05 DIAGNOSIS — R402312 Coma scale, best motor response, none, at arrival to emergency department: Secondary | ICD-10-CM | POA: Diagnosis present

## 2015-08-05 DIAGNOSIS — Z978 Presence of other specified devices: Secondary | ICD-10-CM

## 2015-08-05 DIAGNOSIS — R740 Nonspecific elevation of levels of transaminase and lactic acid dehydrogenase [LDH]: Secondary | ICD-10-CM | POA: Diagnosis present

## 2015-08-05 DIAGNOSIS — I719 Aortic aneurysm of unspecified site, without rupture: Secondary | ICD-10-CM | POA: Diagnosis present

## 2015-08-05 DIAGNOSIS — R001 Bradycardia, unspecified: Secondary | ICD-10-CM | POA: Diagnosis present

## 2015-08-05 DIAGNOSIS — T17998A Other foreign object in respiratory tract, part unspecified causing other injury, initial encounter: Secondary | ICD-10-CM | POA: Diagnosis not present

## 2015-08-05 DIAGNOSIS — I5022 Chronic systolic (congestive) heart failure: Secondary | ICD-10-CM

## 2015-08-05 DIAGNOSIS — Z4659 Encounter for fitting and adjustment of other gastrointestinal appliance and device: Secondary | ICD-10-CM

## 2015-08-05 DIAGNOSIS — R55 Syncope and collapse: Secondary | ICD-10-CM | POA: Diagnosis not present

## 2015-08-05 DIAGNOSIS — I5043 Acute on chronic combined systolic (congestive) and diastolic (congestive) heart failure: Secondary | ICD-10-CM | POA: Diagnosis not present

## 2015-08-05 DIAGNOSIS — E876 Hypokalemia: Secondary | ICD-10-CM | POA: Diagnosis present

## 2015-08-05 DIAGNOSIS — I509 Heart failure, unspecified: Secondary | ICD-10-CM

## 2015-08-05 DIAGNOSIS — E872 Acidosis: Secondary | ICD-10-CM | POA: Diagnosis present

## 2015-08-05 DIAGNOSIS — J9601 Acute respiratory failure with hypoxia: Secondary | ICD-10-CM | POA: Diagnosis present

## 2015-08-05 DIAGNOSIS — R9431 Abnormal electrocardiogram [ECG] [EKG]: Secondary | ICD-10-CM

## 2015-08-05 DIAGNOSIS — R490 Dysphonia: Secondary | ICD-10-CM | POA: Diagnosis not present

## 2015-08-05 LAB — CBC WITH DIFFERENTIAL/PLATELET
BASOS PCT: 0 %
Basophils Absolute: 0 10*3/uL (ref 0.0–0.1)
EOS ABS: 0 10*3/uL (ref 0.0–0.7)
EOS PCT: 0 %
HEMATOCRIT: 28.8 % — AB (ref 36.0–46.0)
HEMOGLOBIN: 9.2 g/dL — AB (ref 12.0–15.0)
LYMPHS PCT: 10 %
Lymphs Abs: 3.4 10*3/uL (ref 0.7–4.0)
MCH: 31.2 pg (ref 26.0–34.0)
MCHC: 31.9 g/dL (ref 30.0–36.0)
MCV: 97.6 fL (ref 78.0–100.0)
Monocytes Absolute: 1.7 10*3/uL — ABNORMAL HIGH (ref 0.1–1.0)
Monocytes Relative: 5 %
NEUTROS ABS: 28.8 10*3/uL — AB (ref 1.7–7.7)
Neutrophils Relative %: 85 %
Platelets: 354 10*3/uL (ref 150–400)
RBC: 2.95 MIL/uL — ABNORMAL LOW (ref 3.87–5.11)
RDW: 14.3 % (ref 11.5–15.5)
WBC: 33.9 10*3/uL — ABNORMAL HIGH (ref 4.0–10.5)

## 2015-08-05 LAB — I-STAT ARTERIAL BLOOD GAS, ED
Acid-base deficit: 4 mmol/L — ABNORMAL HIGH (ref 0.0–2.0)
BICARBONATE: 20.3 meq/L (ref 20.0–24.0)
O2 Saturation: 96 %
TCO2: 21 mmol/L (ref 0–100)
pCO2 arterial: 28.7 mmHg — ABNORMAL LOW (ref 35.0–45.0)
pH, Arterial: 7.445 (ref 7.350–7.450)
pO2, Arterial: 66 mmHg — ABNORMAL LOW (ref 80.0–100.0)

## 2015-08-05 LAB — POCT I-STAT 3, ART BLOOD GAS (G3+)
ACID-BASE DEFICIT: 4 mmol/L — AB (ref 0.0–2.0)
BICARBONATE: 22 meq/L (ref 20.0–24.0)
O2 Saturation: 95 %
PCO2 ART: 33 mmHg — AB (ref 35.0–45.0)
PO2 ART: 56 mmHg — AB (ref 80.0–100.0)
Patient temperature: 89.42
TCO2: 23 mmol/L (ref 0–100)
pH, Arterial: 7.407 (ref 7.350–7.450)

## 2015-08-05 LAB — COMPREHENSIVE METABOLIC PANEL
ALBUMIN: 2.6 g/dL — AB (ref 3.5–5.0)
ALK PHOS: 76 U/L (ref 38–126)
ALT: 33 U/L (ref 14–54)
ANION GAP: 13 (ref 5–15)
AST: 38 U/L (ref 15–41)
BUN: 15 mg/dL (ref 6–20)
CALCIUM: 7.7 mg/dL — AB (ref 8.9–10.3)
CHLORIDE: 92 mmol/L — AB (ref 101–111)
CO2: 22 mmol/L (ref 22–32)
Creatinine, Ser: 0.74 mg/dL (ref 0.44–1.00)
GFR calc Af Amer: >60 mL/min (ref >60)
GFR calc non Af Amer: >60 mL/min (ref >60)
GLUCOSE: 211 mg/dL — AB (ref 65–99)
Potassium: 3.4 mmol/L — ABNORMAL LOW (ref 3.5–5.1)
SODIUM: 127 mmol/L — AB (ref 135–145)
Total Bilirubin: 0.8 mg/dL (ref 0.3–1.2)
Total Protein: 5.1 g/dL — ABNORMAL LOW (ref 6.5–8.1)

## 2015-08-05 LAB — I-STAT CHEM 8, ED
BUN: 18 mg/dL (ref 6–20)
CHLORIDE: 91 mmol/L — AB (ref 101–111)
CREATININE: 0.6 mg/dL (ref 0.44–1.00)
Calcium, Ion: 1.03 mmol/L — ABNORMAL LOW (ref 1.12–1.23)
Glucose, Bld: 202 mg/dL — ABNORMAL HIGH (ref 65–99)
HEMATOCRIT: 33 % — AB (ref 36.0–46.0)
Hemoglobin: 11.2 g/dL — ABNORMAL LOW (ref 12.0–15.0)
POTASSIUM: 3.3 mmol/L — AB (ref 3.5–5.1)
Sodium: 129 mmol/L — ABNORMAL LOW (ref 135–145)
TCO2: 23 mmol/L (ref 0–100)

## 2015-08-05 LAB — PROTIME-INR
INR: 2.51 — ABNORMAL HIGH (ref 0.00–1.49)
Prothrombin Time: 26.8 seconds — ABNORMAL HIGH (ref 11.6–15.2)

## 2015-08-05 LAB — LACTIC ACID, PLASMA
LACTIC ACID, VENOUS: 5.4 mmol/L — AB (ref 0.5–2.0)
Lactic Acid, Venous: 5.7 mmol/L (ref 0.5–2.0)

## 2015-08-05 LAB — I-STAT TROPONIN, ED
TROPONIN I, POC: 0.35 ng/mL — AB (ref 0.00–0.08)
TROPONIN I, POC: 0.42 ng/mL — AB (ref 0.00–0.08)

## 2015-08-05 LAB — TROPONIN I: Troponin I: 0.29 ng/mL — ABNORMAL HIGH (ref <0.031)

## 2015-08-05 LAB — MAGNESIUM: MAGNESIUM: 2.3 mg/dL (ref 1.7–2.4)

## 2015-08-05 LAB — TSH: TSH: 2.455 u[IU]/mL (ref 0.350–4.500)

## 2015-08-05 MED ORDER — FENTANYL CITRATE (PF) 100 MCG/2ML IJ SOLN
100.0000 ug | Freq: Once | INTRAMUSCULAR | Status: AC
  Administered 2015-08-05: 100 ug via INTRAVENOUS

## 2015-08-05 MED ORDER — MIDAZOLAM HCL 5 MG/ML IJ SOLN
1.0000 mg/h | INTRAMUSCULAR | Status: DC
  Administered 2015-08-05: 2 mg/h via INTRAVENOUS
  Administered 2015-08-06: 8 mg/h via INTRAVENOUS
  Administered 2015-08-06: 5 mg/h via INTRAVENOUS
  Administered 2015-08-07 (×2): 8 mg/h via INTRAVENOUS
  Filled 2015-08-05 (×5): qty 10

## 2015-08-05 MED ORDER — AMIODARONE HCL IN DEXTROSE 360-4.14 MG/200ML-% IV SOLN: 30.0000 mg/h | INTRAVENOUS | Status: DC

## 2015-08-05 MED ORDER — MIDAZOLAM HCL 2 MG/2ML IJ SOLN
2.0000 mg | Freq: Once | INTRAMUSCULAR | Status: AC
  Administered 2015-08-05: 2 mg via INTRAVENOUS

## 2015-08-05 MED ORDER — AMIODARONE HCL IN DEXTROSE 360-4.14 MG/200ML-% IV SOLN
60.0000 mg/h | INTRAVENOUS | Status: DC
  Administered 2015-08-06: 60 mg/h via INTRAVENOUS
  Filled 2015-08-05: qty 200

## 2015-08-05 MED ORDER — PANTOPRAZOLE SODIUM 40 MG IV SOLR
40.0000 mg | Freq: Every day | INTRAVENOUS | Status: DC
Start: 2015-08-05 — End: 2015-08-13
  Administered 2015-08-06 – 2015-08-12 (×8): 40 mg via INTRAVENOUS
  Filled 2015-08-05 (×8): qty 40

## 2015-08-05 MED ORDER — IOHEXOL 350 MG/ML SOLN
80.0000 mL | Freq: Once | INTRAVENOUS | Status: AC | PRN
  Administered 2015-08-05: 80 mL via INTRAVENOUS

## 2015-08-05 MED ORDER — SODIUM CHLORIDE 0.9 % IV SOLN: 2000.0000 mL | Freq: Once | INTRAVENOUS | Status: DC

## 2015-08-05 MED ORDER — SODIUM CHLORIDE 0.9 % IV SOLN
1.5000 g | Freq: Four times a day (QID) | INTRAVENOUS | Status: AC
  Administered 2015-08-06 – 2015-08-13 (×29): 1.5 g via INTRAVENOUS
  Filled 2015-08-05 (×29): qty 1.5

## 2015-08-05 MED ORDER — SODIUM CHLORIDE 0.9 % IV BOLUS (SEPSIS)
1000.0000 mL | Freq: Once | INTRAVENOUS | Status: AC
  Administered 2015-08-05: 1000 mL via INTRAVENOUS

## 2015-08-05 MED ORDER — DEXTROSE 5 % IV SOLN
0.0000 ug/min | INTRAVENOUS | Status: DC
  Administered 2015-08-05: 5 ug/min via INTRAVENOUS
  Filled 2015-08-05: qty 4

## 2015-08-05 MED ORDER — MIDAZOLAM BOLUS VIA INFUSION
2.0000 mg | INTRAVENOUS | Status: DC | PRN
  Filled 2015-08-05: qty 2

## 2015-08-05 MED ORDER — AMIODARONE LOAD VIA INFUSION
150.0000 mg | Freq: Once | INTRAVENOUS | Status: DC
  Filled 2015-08-05: qty 83.34

## 2015-08-05 MED ORDER — DEXTROSE 5 % IV SOLN
0.0000 ug/min | Freq: Once | INTRAVENOUS | Status: DC
  Filled 2015-08-05: qty 4

## 2015-08-05 MED ORDER — CISATRACURIUM BOLUS VIA INFUSION
0.0500 mg/kg | INTRAVENOUS | Status: DC | PRN
  Filled 2015-08-05: qty 3

## 2015-08-05 MED ORDER — ASPIRIN 300 MG RE SUPP
300.0000 mg | RECTAL | Status: AC
  Administered 2015-08-05: 300 mg via RECTAL
  Filled 2015-08-05: qty 1

## 2015-08-05 MED ORDER — DEXTROSE 5 % IV SOLN
300.0000 mg | INTRAVENOUS | Status: AC | PRN
  Administered 2015-08-05: 300 mg via INTRAVENOUS

## 2015-08-05 MED ORDER — FENTANYL BOLUS VIA INFUSION
50.0000 ug | INTRAVENOUS | Status: DC | PRN
  Filled 2015-08-05: qty 50

## 2015-08-05 MED ORDER — EPINEPHRINE HCL 1 MG/ML IJ SOLN
0.5000 ug/min | INTRAVENOUS | Status: DC
  Administered 2015-08-05: 4 ug/min via INTRAVENOUS
  Administered 2015-08-05: 3 ug/min via INTRAVENOUS
  Filled 2015-08-05: qty 4

## 2015-08-05 MED ORDER — SODIUM CHLORIDE 0.9 % IV SOLN
25.0000 ug/h | INTRAVENOUS | Status: DC
  Administered 2015-08-05: 100 ug/h via INTRAVENOUS
  Administered 2015-08-06 – 2015-08-07 (×2): 350 ug/h via INTRAVENOUS
  Filled 2015-08-05 (×5): qty 50

## 2015-08-05 MED ORDER — EPINEPHRINE HCL 0.1 MG/ML IJ SOSY
PREFILLED_SYRINGE | INTRAMUSCULAR | Status: AC | PRN
  Administered 2015-08-05 (×2): 1 mg via INTRAVENOUS

## 2015-08-05 MED ORDER — SODIUM CHLORIDE 0.9 % IV SOLN
1.0000 ug/kg/min | INTRAVENOUS | Status: DC
  Administered 2015-08-05 – 2015-08-06 (×2): 1 ug/kg/min via INTRAVENOUS
  Filled 2015-08-05 (×2): qty 20

## 2015-08-05 MED ORDER — ARTIFICIAL TEARS OP OINT
1.0000 "application " | TOPICAL_OINTMENT | Freq: Three times a day (TID) | OPHTHALMIC | Status: DC
  Administered 2015-08-06 – 2015-08-07 (×6): 1 via OPHTHALMIC
  Filled 2015-08-05: qty 3.5

## 2015-08-05 MED ORDER — CISATRACURIUM BOLUS VIA INFUSION
0.1000 mg/kg | Freq: Once | INTRAVENOUS | Status: AC
  Administered 2015-08-05: 5.8 mg via INTRAVENOUS
  Filled 2015-08-05: qty 6

## 2015-08-05 NOTE — Telephone Encounter (Signed)
Noticed in review that she comes to [place] for her INR and coumadin Routing to [place] - Coumadin clinic. No pain or dysuria.  Just noticed today with urination

## 2015-08-05 NOTE — Progress Notes (Signed)
   08/05/15 2100  Clinical Encounter Type  Visited With Family;Health care provider  Visit Type Code;Critical Care;ED  Referral From Nurse  Spiritual Encounters  Spiritual Needs Emotional;Grief support  CH met with family; [place] had previously supported family from last admission; daughter has anxiety and is concerned about mother; sister of pt part of consult via phone, husband also present; family escorted to [facility] waiting area waiting for pt admit to [place].  [place] support available as needed. 9:14 PM Gwynn Burly

## 2015-08-05 NOTE — ED Notes (Signed)
Per EMS: pt found in living room by family after about a minute of being out of sight. Pt pulse and apenic, 150 mL of food content suctioned from pt. CPR initiated 1810, ROSC 1817, given one epi prior to ROSC. Pt lost pulse again en route and compressions restarted

## 2015-08-05 NOTE — ED Provider Notes (Signed)
CSN: [id number]     Arrival date & time 08/05/15  1853 History   First MD Initiated Contact with Patient 08/05/15 1910     Chief Complaint  Patient presents with  . Cardiac Arrest     (Consider location/radiation/quality/duration/timing/severity/associated sxs/prior Treatment) Patient is a 55 y.o. female presenting with general illness. The history is provided by the EMS personnel.  Illness Location:  Post cardiac arrest Quality:  Initially PEA arrest Severity:  Severe Onset quality:  Sudden Timing:  Constant Progression:  Unchanged Chronicity:  New Context:  Recent mitral valve repair Relieved by:  ACLS Worsened by:  N/a Ineffective treatments:  N/a Associated symptoms comment:  Unclear if any preceding symptoms   Past Medical History  Diagnosis Date  . Shortness of breath 05/29/2015  . Severe mitral regurgitation 07/03/2015  . Chronic combined systolic and diastolic CHF (congestive heart failure) (HCC) 07/03/2015    Grade 3 diastolic dysfunction.  LVEF 40-45% 05/2015.  Marland Kitchen Aortic aneurysm (HCC)   . Patent ductus arteriosus 07/16/2015  . Depression     SOME DEPRESSION (ON MEDS FOR 2 YRS) WHEN HER MOTHER PASSED  . 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 07/22/2015    26 mm Sorin Memo 3D ring annuloplasty with ligation of patent ductus arteriosus and clipping of LA appendage   Past Surgical History  Procedure Laterality Date  . Ovarian cyst removal    . Tee without cardioversion N/A 07/07/2015    Procedure: TRANSESOPHAGEAL ECHOCARDIOGRAM (TEE);  Surgeon: Chilton Si, MD;  Location: [facility] ENDOSCOPY;  Service: Cardiovascular;  Laterality: N/A;  . Cardiac catheterization N/A 07/08/2015    Procedure: Right/Left Heart Cath and Coronary Angiography;  Surgeon: Marykay Lex, MD;  Location: [facility] INVASIVE CV LAB;  Service: Cardiovascular;  Laterality: N/A;  . Mitral valve repair N/A 07/22/2015    Procedure: MITRAL VALVE REPAIR;  Surgeon: Purcell Nails, MD;   Location: [facility] OR;  Service: Open Heart Surgery;  Laterality: N/A;  26 Sorin Memo 3D  . Tee without cardioversion N/A 07/22/2015    Procedure: TRANSESOPHAGEAL ECHOCARDIOGRAM (TEE);  Surgeon: Purcell Nails, MD;  Location: [facility] OR;  Service: Open Heart Surgery;  Laterality: N/A;  . Patent ductus arterious repair N/A 07/22/2015    Procedure: PATENT DUCTUS ARTERIOSUS (PDA) ligation;  Surgeon: Purcell Nails, MD;  Location: [facility] OR;  Service: Open Heart Surgery;  Laterality: N/A;  . Clipping of atrial appendage  07/22/2015    Procedure: CLIPPING OF LEFT ATRIAL APPENDAGE;  Surgeon: Purcell Nails, MD;  Location: [facility] OR;  Service: Open Heart Surgery;;  35 Atriclip Pro   No family history on file. Social History  Substance Use Topics  . Smoking status: Never Smoker   . Smokeless tobacco: Never Used  . Alcohol Use: No   OB History    No data available     Review of Systems  Unable to perform ROS: Acuity of condition      Allergies  Vicodin; Adhesive; and Silicone  Home Medications   Prior to Admission medications   Medication Sig Start Date End Date Taking? Authorizing Provider  aspirin EC 81 MG EC tablet Take 1 tablet (81 mg total) by mouth daily. 07/28/15  Yes Wayne E Gold, PA-C  furosemide (LASIX) 20 MG tablet Take 1 tablet (20 mg total) by mouth daily. 07/31/15  Yes Purcell Nails, MD  potassium chloride (K-DUR,KLOR-CON) 10 MEQ tablet Take 1 tablet (10 mEq total) by mouth daily. Patient taking differently: Take 10  mEq by mouth at bedtime.  07/31/15  Yes Purcell Nails, MD  warfarin (COUMADIN) 1 MG tablet Take 2 tablets (2 mg total) by mouth daily at 6 PM. 07/28/15  Yes Wayne E Gold, PA-C  metoprolol tartrate (LOPRESSOR) 25 MG tablet Take 0.5 tablets (12.5 mg total) by mouth 2 (two) times daily. 07/31/15   Purcell Nails, MD  traMADol (ULTRAM) 50 MG tablet Take 1-2 tablets (50-100 mg total) by mouth every 6 (six) hours as needed for moderate pain. Patient not taking: Reported on 08/05/2015 07/28/15    Glenice Laine Gold, PA-C   BP 86/59 mmHg  Pulse 80  Temp(Src) 91.2 F (32.9 C) (Core (Comment))  Resp 18  Ht 5\' 2"  (1.575 m)  Wt 59.3 kg  BMI 23.91 kg/m2  SpO2 100% Physical Exam  Constitutional: She appears well-developed and well-nourished. Face mask in place.  HENT:  Head: Normocephalic and atraumatic.  Mouth/Throat: No oropharyngeal exudate.  Large amount of vomitus in mouth and face  Eyes:  Dilated pupils bilaterally and equal  Neck: Normal range of motion. Neck supple. No tracheal deviation present.  Cardiovascular: Normal heart sounds and intact distal pulses.   Initially tachycardic with palpable pulses  Pulmonary/Chest:  Agonal respirations.   Abdominal: There is no tenderness. There is no rebound and no guarding.  Musculoskeletal: She exhibits no edema.  Neurological: She is unresponsive. She displays no tremor. GCS eye subscore is 1. GCS verbal subscore is 1. GCS motor subscore is 1.  Skin: Skin is warm. No rash noted. No erythema. No pallor.  Nursing note and vitals reviewed.   ED Course  .Intubation Date/Time: 08/05/2015 8:41 PM Performed by: Marijean Niemann Authorized by: Marijean Niemann Consent: The procedure was performed in an emergent situation. Verbal consent not obtained. Written consent not obtained. Indications: respiratory failure,  airway protection and  hypoxemia Intubation method: video-assisted Patient status: paralyzed (RSI) Preoxygenation: BVM Sedatives: etomidate Paralytic: rocuronium Laryngoscope size: glide scope on second attempt. Tube size: 7.5 mm Tube type: cuffed Number of attempts: 2 Ventilation between attempts: BVM Cricoid pressure: no Cords visualized: yes Post-procedure assessment: chest rise,  ETCO2 monitor and CO2 detector Breath sounds: reduced on left Cuff inflated: yes ETT to teeth: 25 cm Tube secured with: ETT holder Chest x-ray interpreted by me and other physician. Chest x-ray findings: endotracheal tube too low Tube  repositioned: tube repositioned successfully Patient tolerance: Patient tolerated the procedure well with no immediate complications   (including critical care time) Labs Review Labs Reviewed  COMPREHENSIVE METABOLIC PANEL - Abnormal; Notable for the following:    Sodium 127 (*)    Potassium 3.4 (*)    Chloride 92 (*)    Glucose, Bld 211 (*)    Calcium 7.7 (*)    Total Protein 5.1 (*)    Albumin 2.6 (*)    All other components within normal limits  CBC WITH DIFFERENTIAL/PLATELET - Abnormal; Notable for the following:    WBC 33.9 (*)    RBC 2.95 (*)    Hemoglobin 9.2 (*)    HCT 28.8 (*)    Neutro Abs 28.8 (*)    Monocytes Absolute 1.7 (*)    All other components within normal limits  LACTIC ACID, PLASMA - Abnormal; Notable for the following:    Lactic Acid, Venous 5.7 (*)    All other components within normal limits  LACTIC ACID, PLASMA - Abnormal; Notable for the following:    Lactic Acid, Venous 5.4 (*)    All other components  within normal limits  TROPONIN I - Abnormal; Notable for the following:    Troponin I 0.29 (*)    All other components within normal limits  PROTIME-INR - Abnormal; Notable for the following:    Prothrombin Time 26.8 (*)    INR 2.51 (*)    All other components within normal limits  TROPONIN I - Abnormal; Notable for the following:    Troponin I 0.52 (*)    All other components within normal limits  TROPONIN I - Abnormal; Notable for the following:    Troponin I 0.31 (*)    All other components within normal limits  TROPONIN I - Abnormal; Notable for the following:    Troponin I 0.58 (*)    All other components within normal limits  BASIC METABOLIC PANEL - Abnormal; Notable for the following:    Sodium 126 (*)    Potassium 3.0 (*)    Chloride 90 (*)    Glucose, Bld 283 (*)    Calcium 7.9 (*)    All other components within normal limits  BASIC METABOLIC PANEL - Abnormal; Notable for the following:    Sodium 127 (*)    Chloride 92 (*)     Glucose, Bld 282 (*)    Calcium 8.1 (*)    All other components within normal limits  BASIC METABOLIC PANEL - Abnormal; Notable for the following:    Sodium 130 (*)    Chloride 95 (*)    Glucose, Bld 239 (*)    Calcium 8.3 (*)    All other components within normal limits  BASIC METABOLIC PANEL - Abnormal; Notable for the following:    Sodium 129 (*)    Chloride 99 (*)    Glucose, Bld 163 (*)    Calcium 8.1 (*)    All other components within normal limits  PROTIME-INR - Abnormal; Notable for the following:    Prothrombin Time 28.4 (*)    INR 2.71 (*)    All other components within normal limits  PROTIME-INR - Abnormal; Notable for the following:    Prothrombin Time 27.0 (*)    INR 2.54 (*)    All other components within normal limits  APTT - Abnormal; Notable for the following:    aPTT 56 (*)    All other components within normal limits  APTT - Abnormal; Notable for the following:    aPTT 38 (*)    All other components within normal limits  PHOSPHORUS - Abnormal; Notable for the following:    Phosphorus 1.4 (*)    All other components within normal limits  BLOOD GAS, ARTERIAL - Abnormal; Notable for the following:    pH, Arterial 7.496 (*)    pCO2 arterial 27.8 (*)    pO2, Arterial 234 (*)    Allens test (pass/fail) NOT INDICATED (*)    All other components within normal limits  CBC - Abnormal; Notable for the following:    WBC 39.1 (*)    RBC 3.43 (*)    Hemoglobin 11.1 (*)    HCT 31.7 (*)    All other components within normal limits  OSMOLALITY - Abnormal; Notable for the following:    Osmolality 272 (*)    All other components within normal limits  GLUCOSE, CAPILLARY - Abnormal; Notable for the following:    Glucose-Capillary 278 (*)    All other components within normal limits  GLUCOSE, CAPILLARY - Abnormal; Notable for the following:    Glucose-Capillary 240 (*)  All other components within normal limits  GLUCOSE, CAPILLARY - Abnormal; Notable for the  following:    Glucose-Capillary 179 (*)    All other components within normal limits  GLUCOSE, CAPILLARY - Abnormal; Notable for the following:    Glucose-Capillary 157 (*)    All other components within normal limits  GLUCOSE, CAPILLARY - Abnormal; Notable for the following:    Glucose-Capillary 218 (*)    All other components within normal limits  GLUCOSE, CAPILLARY - Abnormal; Notable for the following:    Glucose-Capillary 143 (*)    All other components within normal limits  GLUCOSE, CAPILLARY - Abnormal; Notable for the following:    Glucose-Capillary 119 (*)    All other components within normal limits  I-STAT TROPOININ, ED - Abnormal; Notable for the following:    Troponin i, poc 0.35 (*)    All other components within normal limits  I-STAT CHEM 8, ED - Abnormal; Notable for the following:    Sodium 129 (*)    Potassium 3.3 (*)    Chloride 91 (*)    Glucose, Bld 202 (*)    Calcium, Ion 1.03 (*)    Hemoglobin 11.2 (*)    HCT 33.0 (*)    All other components within normal limits  I-STAT TROPOININ, ED - Abnormal; Notable for the following:    Troponin i, poc 0.42 (*)    All other components within normal limits  I-STAT ARTERIAL BLOOD GAS, ED - Abnormal; Notable for the following:    pCO2 arterial 28.7 (*)    pO2, Arterial 66.0 (*)    Acid-base deficit 4.0 (*)    All other components within normal limits  POCT I-STAT 3, ART BLOOD GAS (G3+) - Abnormal; Notable for the following:    pCO2 arterial 33.0 (*)    pO2, Arterial 56.0 (*)    Acid-base deficit 4.0 (*)    All other components within normal limits  MRSA PCR SCREENING  URINE CULTURE  CULTURE, BLOOD (ROUTINE X 2)  CULTURE, BLOOD (ROUTINE X 2)  CULTURE, RESPIRATORY (NON-EXPECTORATED)  URINE CULTURE  MAGNESIUM  TSH  MAGNESIUM  SODIUM, URINE, RANDOM  OSMOLALITY, URINE  TROPONIN I    Imaging Review Ct Angio Chest Pe W/cm &/or Wo Cm  08/05/2015  CLINICAL DATA:  55 year old female with shortness of breath.  Status post CPR. EXAM: CT ANGIOGRAPHY CHEST WITH CONTRAST TECHNIQUE: Multidetector CT imaging of the chest was performed using the standard protocol during bolus administration of intravenous contrast. Multiplanar CT image reconstructions and MIPs were obtained to evaluate the vascular anatomy. CONTRAST:  80mL OMNIPAQUE IOHEXOL 350 MG/ML SOLN COMPARISON:  Radiograph dated 08/05/2015 FINDINGS: An endotracheal tube is noted with tip at the carina tilting towards the right mainstem bronchus. Recommend retraction and repositioning by approximately 3- 4 cm. Moderate bilateral pleural effusions. There is complete consolidation of the lower lobes bilaterally with air bronchograms. There is partial consolidative changes of the left upper lobe and lingula. There is ground-glass opacity within the lingula. Findings may represent pneumonia, or pulmonary edema or ARDS. Clinical correlation is recommended. There is no pneumothorax. The visualized thoracic aorta appears unremarkable. No CT evidence of pulmonary embolism. Top-normal cardiac size. Mechanical mitral valve. Small pockets of air noted within the right atrium, likely iatrogenic from IV injection. There is retrograde flow of contrast from the right atrium into the IVC compatible with a degree of right cardiac dysfunction. No significant pericardial effusion. Small amount of fluid along the left aortic arch in the upper  mediastinum may represent complex fluid or small amount of blood/serosanguineous fluid. Small pockets of air noted within this fluid in the upper mediastinum compatible with small pneumomediastinum. An enteric tube is partially visualized extending into the upper abdomen with tip not included in the CT. There is no axillary adenopathy. There is diffuse subcutaneous soft tissue edema and anasarca. Mild degenerative changes of the spine. Median sternotomy wires age indeterminate fracture of the left first rib, new from prior study possibly acute or  subacute. No other acute fracture identified. Review of the MIP images confirms the above findings. IMPRESSION: No CT evidence of pulmonary embolism. Moderate bilateral pleural effusions with complete consolidative changes of the lower lobes and partial consolidative changes of the left upper lobe. Clinical correlation is recommended. Endotracheal tube the tip at the origin of the right mainstem bronchus. Recommend retraction and repositioning by approximately 3-4 cm. Small high attenuating fluid along the left aspect of the aortic arch may represent complex fluid or small amount of blood. These results were called by telephone at the time of interpretation on 08/05/2015 at 10:59 pm to Dr. Marijean Niemann , who verbally acknowledged these results. Electronically Signed   By: Elgie Collard M.D.   On: 08/05/2015 23:04   Dg Chest Port 1 View  08/05/2015  CLINICAL DATA:  Central line placement.  Initial encounter. EXAM: PORTABLE CHEST 1 VIEW COMPARISON:  Chest radiograph performed earlier today at 7:10 p.m., and CTA of the chest performed earlier today at 10:15 p.m. FINDINGS: The left IJ line is noted ending about the distal SVC. The patient's endotracheal tube is noted ending 2-3 cm above the carina. An enteric tube is noted extending below the diaphragm. Vascular congestion is again noted. Note is made of small right and small to moderate left pleural effusions, and likely underlying pulmonary edema. No pneumothorax is seen. The cardiomediastinal silhouette is mildly enlarged. The patient is status post median sternotomy. A mitral valve replacement is noted. No acute osseous abnormalities are identified. IMPRESSION: 1. Left IJ line noted ending about the distal SVC. 2. Endotracheal tube noted ending 2-3 cm above the carina. 3. Vascular congestion and mild cardiomegaly. Small right and small to moderate left pleural effusions, with likely underlying pulmonary edema. Electronically Signed   By: Roanna Raider M.D.    On: 08/05/2015 23:50   Dg Chest Portable 1 View  08/05/2015  CLINICAL DATA:  Status post CPR. Endotracheal tube placement. Initial encounter. EXAM: PORTABLE CHEST 1 VIEW COMPARISON:  Chest radiograph performed 07/31/2015 FINDINGS: The patient's endotracheal tube is noted ending overlying the right mainstem bronchus, 1 cm below the carina. This should be retracted 4 cm. Diffuse bilateral airspace opacification is noted, with a small to moderate left-sided pleural effusion. This is concerning for pulmonary edema, though pneumonia cannot be excluded. No pneumothorax is seen. The cardiomediastinal silhouette is mildly enlarged. The patient is status post median sternotomy. External pacing pads are noted. No acute osseous abnormalities are seen. IMPRESSION: 1. Endotracheal tube noted ending overlying the right mainstem bronchus, 1 cm below the carina. This should be retracted 4 cm. 2. Diffuse bilateral airspace opacification, with a small to moderate left-sided pleural effusion. This is concerning for pulmonary edema, though pneumonia cannot be excluded. 3. Mild cardiomegaly. These results were called by telephone at the time of interpretation on 08/05/2015 at 9:20 pm to [facility], who verbally acknowledged these results. The endotracheal tube had already been repositioned. Electronically Signed   By: Beryle Beams.D.  On: 08/05/2015 21:50   I have personally reviewed and evaluated these images and lab results as part of my medical decision-making.   EKG Interpretation   Date/Time:  Tuesday August 05 2015 19:05:56 EST Ventricular Rate:  109 PR Interval:    QRS Duration: 137 QT Interval:  362 QTC Calculation: 487 R Axis:   89 Text Interpretation:  Atrial flutter with predominant 2:1 AV block  Nonspecific intraventricular conduction delay Abnormal lateral Q waves  Anteroseptal infarct, possibly acute ED PHYSICIAN INTERPRETATION AVAILABLE  IN [facility] Confirmed by TEST,  Record ([id number]) on 08/06/2015 7:34:56  AM      MDM   Final diagnoses:  Central line insertion site infection, initial encounter  Pleural effusion  Congestive heart failure (HCC)    Patient is a 55 year old female with a recent mitral valve repair who presents via EMS as a post arrest. Patient was found down in the living room after eating dinner by family. CPR initiated by EMS. She achieved ROSC after about 7-10 minutes with one round of epi. The patient underwent another round of compressions prior to arrival. EMS attempted to intubated but could not 2/2 large amount of vomit. Upon arrival patient tachycardic and airway maintained with bag valve mask with copious amount of vomiting with nasal trumpet in place. Patient developed Vtach requiring 3 rounds of defibrillation. Amiodarone and epi given. Patient intubated as above for airway protection. EKG with what appears to be elevations in a known left bundle branch block along her anterior leads. Patient persistently hypotensive despite epi drip thus levophed will be started. Cardiology consulted. Patient be admitted to the ICU for further management and evaluation. Hypothermia protocol initiated.    Marijean Niemann, MD 08/06/15 [id number]  Rolan Bucco, MD 08/08/15 1505

## 2015-08-05 NOTE — ED Notes (Signed)
Ice packs placed on armpits, groin and neck per hypothermia protocol

## 2015-08-05 NOTE — ED Notes (Signed)
Hold levophed until needed for BP per Dr. Fredderick Phenix

## 2015-08-05 NOTE — Telephone Encounter (Signed)
Called patient and spoke to daughter also.  States that Dr. Barry Dienes writes her Warfarin orders.   Told daughter that passing blood in urine can be from Warfarin. Advised patient and daughter to call Dr. Barry Dienes office and let them know of the symptom.  They might want to check her INR and adjust her Coumadin level

## 2015-08-05 NOTE — Telephone Encounter (Signed)
Spoke with patient, she states hematuria x 1 this am.  None since.  Advised patient to monitor and let us know if repeats tomorrow morning.  If she develops more problems tonight she can go to ER for follow up.  Patient has appointment on Friday for follow up INR.  Patient voiced understanding

## 2015-08-05 NOTE — Telephone Encounter (Signed)
Diane [name] is calling because there are some blood in her urine and not sure if this was some side effect from the medication (Warfrain and Baby Aspirin)   Please call

## 2015-08-05 NOTE — Progress Notes (Signed)
ANTIBIOTIC CONSULT NOTE - INITIAL  Pharmacy Consult: Unasyn Indication:   Aspiration PNA  Allergies  Allergen Reactions  . Vicodin [Hydrocodone-Acetaminophen] Other (See Comments)    "she feels out of it", hallucinating  . Adhesive [Tape] Itching and Rash  . Silicone Itching and Rash    TAPE ALLERGY/EKG STICKER ALLERGY, Please use "paper" tape only    Patient Measurements: Weight = 58.1 kg Height = 62 inches  Vital Signs: Temp: 93 F (33.9 C) (01/17 2130) BP: 154/80 mmHg (01/17 2130) Pulse Rate: 82 (01/17 2130)  Labs:  Recent Labs  08/05/15 2006 08/05/15 2008  WBC 33.9*  --   HGB 9.2* 11.2*  PLT 354  --   CREATININE 0.74 0.60   Estimated Creatinine Clearance: 62.1 mL/min (by C-G formula based on Cr of 0.6). No results for input(s): VANCOTROUGH, VANCOPEAK, VANCORANDOM, GENTTROUGH, GENTPEAK, GENTRANDOM, TOBRATROUGH, TOBRAPEAK, TOBRARND, AMIKACINPEAK, AMIKACINTROU, AMIKACIN in the last 72 hours.   Microbiology: Recent Results (from the past 720 hour(s))  Surgical pcr screen     Status: None   Collection Time: 07/17/15 12:09 PM  Result Value Ref Range Status   MRSA, PCR NEGATIVE NEGATIVE Final   Staphylococcus aureus NEGATIVE NEGATIVE Final    Comment:        The Xpert SA Assay (FDA approved for NASAL specimens in patients over 57 years of age), is one component of a comprehensive surveillance program.  Test performance has been validated by [facility] for patients greater than or equal to 57 year old. It is not intended to diagnose infection nor to guide or monitor treatment.     Medical History: Past Medical History  Diagnosis Date  . Shortness of breath 05/29/2015  . Severe mitral regurgitation 07/03/2015  . Chronic combined systolic and diastolic CHF (congestive heart failure) (HCC) 07/03/2015    Grade 3 diastolic dysfunction.  LVEF 40-45% 05/2015.  Marland Kitchen Aortic aneurysm (HCC)   . Patent ductus arteriosus 07/16/2015  . Depression     SOME DEPRESSION  (ON MEDS FOR 2 YRS) WHEN HER MOTHER PASSED  . 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 07/22/2015    26 mm Sorin Memo 3D ring annuloplasty with ligation of patent ductus arteriosus and clipping of LA appendage      Assessment: 55 YOF admitted s/p PEA arrest at home.  Pharmacy consulted to initiate Unasyn for possible aspiration PNA.  Baseline labs reviewed.   Goal of Therapy:  Resolution of infection   Plan:  - Unasyn 1.5gm IV Q6H - Monitor renal fxn, clinical progress    Saffron Busey D. Laney Potash, PharmD, BCPS Pager:  [phone] 08/05/2015, 10:39 PM

## 2015-08-05 NOTE — Procedures (Signed)
Central Venous Catheter Insertion Procedure Note Diane [name] [id number] [birth year]  Procedure: Insertion of Central Venous Catheter Indications: Assessment of intravascular volume, Drug and/or fluid administration and Frequent blood sampling  Procedure Details Consent: Unable to obtain consent because of altered level of consciousness. Time Out: Verified patient identification, verified procedure, site/side was marked, verified correct patient position, special equipment/implants available, medications/allergies/relevent history reviewed, required imaging and test results available.  Performed  Maximum sterile technique was used including antiseptics, cap, gloves, gown, hand hygiene, mask and sheet. Skin prep: Chlorhexidine; local anesthetic administered A antimicrobial bonded/coated triple lumen catheter was placed in the left internal jugular vein using the Seldinger technique.  Evaluation Blood flow good Complications: No apparent complications Patient did tolerate procedure well. Chest X-ray ordered to verify placement.  CXR: pending.  Procedure performed under direct ultrasound guidance for real time vessel cannulation.      Rutherford Guys, [place] [facility] Pager: [phone]  or [phone] 08/05/2015, 11:18 PM

## 2015-08-05 NOTE — ED Notes (Signed)
Echo at bedside

## 2015-08-05 NOTE — ED Notes (Signed)
Cardiology at bedside.

## 2015-08-05 NOTE — Code Documentation (Signed)
Epi drip started 86mcg/min

## 2015-08-05 NOTE — Progress Notes (Signed)
abg collected, pt is on artic sun cooling

## 2015-08-05 NOTE — Consult Note (Addendum)
Reason for Consult: PEA arrest   Referring Physician: Dr.  Armond Hang- ER MD  PCP:  Emeterio Reeve, MD  Primary Cardiologist:Dr. Miles Costain Brines is an 55 y.o. female.    Chief Complaint: PEA arrest   HPI: 55 year old female with recent MV repair 07/22/15 for severe mitral regurgitation, PDA, and clipping of Lt atrial appendage.   She had cardiac cath 07/08/15 with normal coronary arteries.   She did well post op and was discharged 07/27/15.  She did have heart block post op but no strips to view.    Today per EMS she was found in her living room after being out of sight about 1 min.  EMS suctioned 150 ml fluid and CPR for 7 min with return of pulse,  Then en route lost pulse again.  CPR and epi.  Here in ER V tach X 3 with shocks to A fib.  Pt unresponsive and now intubated on vent.  BP 90 systolic.  No family currently to talk to.  Labs pending.  INR on 1/12 was 1.9. Highest prior to discharge 1.95.    EKG with a fib with LBBB.    Past Medical History  Diagnosis Date  . Shortness of breath 05/29/2015  . Severe mitral regurgitation 07/03/2015  . Chronic combined systolic and diastolic CHF (congestive heart failure) (HCC) 07/03/2015    Grade 3 diastolic dysfunction.  LVEF 40-45% 05/2015.  Marland Kitchen Aortic aneurysm (HCC)   . Patent ductus arteriosus 07/16/2015  . Depression     SOME DEPRESSION (ON MEDS FOR 2 YRS) WHEN HER MOTHER PASSED  . 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 07/22/2015    26 mm Sorin Memo 3D ring annuloplasty with ligation of patent ductus arteriosus and clipping of LA appendage    Past Surgical History  Procedure Laterality Date  . Ovarian cyst removal    . Tee without cardioversion N/A 07/07/2015    Procedure: TRANSESOPHAGEAL ECHOCARDIOGRAM (TEE);  Surgeon: Chilton Si, MD;  Location: [facility] ENDOSCOPY;  Service: Cardiovascular;  Laterality: N/A;  . Cardiac catheterization N/A 07/08/2015    Procedure: Right/Left Heart Cath  and Coronary Angiography;  Surgeon: Marykay Lex, MD;  Location: [facility] INVASIVE CV LAB;  Service: Cardiovascular;  Laterality: N/A;  . Mitral valve repair N/A 07/22/2015    Procedure: MITRAL VALVE REPAIR;  Surgeon: Purcell Nails, MD;  Location: [facility] OR;  Service: Open Heart Surgery;  Laterality: N/A;  26 Sorin Memo 3D  . Tee without cardioversion N/A 07/22/2015    Procedure: TRANSESOPHAGEAL ECHOCARDIOGRAM (TEE);  Surgeon: Purcell Nails, MD;  Location: [facility] OR;  Service: Open Heart Surgery;  Laterality: N/A;  . Patent ductus arterious repair N/A 07/22/2015    Procedure: PATENT DUCTUS ARTERIOSUS (PDA) ligation;  Surgeon: Purcell Nails, MD;  Location: [facility] OR;  Service: Open Heart Surgery;  Laterality: N/A;  . Clipping of atrial appendage  07/22/2015    Procedure: CLIPPING OF LEFT ATRIAL APPENDAGE;  Surgeon: Purcell Nails, MD;  Location: [facility] OR;  Service: Open Heart Surgery;;  35 Atriclip Pro    No family history on file. unable to obtain due to unconscious   Social History:  reports that she has never smoked. She has never used smokeless tobacco. She reports that she does not drink alcohol or use illicit drugs.  Allergies:  Allergies  Allergen Reactions  . Vicodin [Hydrocodone-Acetaminophen] Other (See Comments)    "she feels out of  it", hallucinating  . Adhesive [Tape] Itching and Rash  . Silicone Itching and Rash    TAPE ALLERGY/EKG STICKER ALLERGY, Please use "paper" tape only    OUTPATIENT MEDICATIONS: No current facility-administered medications on file prior to encounter.   Current Outpatient Prescriptions on File Prior to Encounter  Medication Sig Dispense Refill  . aspirin EC 81 MG EC tablet Take 1 tablet (81 mg total) by mouth daily.    . furosemide (LASIX) 20 MG tablet Take 1 tablet (20 mg total) by mouth daily. 30 tablet 1  . metoprolol tartrate (LOPRESSOR) 25 MG tablet Take 0.5 tablets (12.5 mg total) by mouth 2 (two) times daily. 60 tablet 1  . Multiple Vitamin (MULTIVITAMIN)  tablet Take 1 tablet by mouth daily.    . naproxen sodium (ANAPROX) 220 MG tablet Take 220 mg by mouth 2 (two) times daily as needed (for pain).    . potassium chloride (K-DUR,KLOR-CON) 10 MEQ tablet Take 1 tablet (10 mEq total) by mouth daily. 30 tablet 1  . psyllium (REGULOID) 0.52 G capsule Take 0.52 g by mouth daily.    . traMADol (ULTRAM) 50 MG tablet Take 1-2 tablets (50-100 mg total) by mouth every 6 (six) hours as needed for moderate pain. 50 tablet 0  . warfarin (COUMADIN) 1 MG tablet Take 2 tablets (2 mg total) by mouth daily at 6 PM. 100 tablet 1    No results found for this or any previous visit (from the past 48 hour(s)). No results found.  ROS: unable to obtain due to unresponsiveness.   Blood pressure 82/54, pulse 62, resp. rate 14, SpO2 92 %.  Wt Readings from Last 3 Encounters:  07/31/15 128 lb (58.06 kg)  07/28/15 129 lb (58.514 kg)  07/17/15 115 lb 14.4 oz (52.572 kg)    PE: General:unresponsive intubated on vent  Skin:Warm to cool and dry, brisk capillary refill HEENT:normocephalic, sclera clear, mucus membranes moist Heart:S1S2 RRR without murmur, gallup, rub or click Lungs:diminished without rales, rhonchi, or wheezes JXB:JYNW, non tender, + BS, do not palpate liver spleen or masses Ext:no lower ext edema, 2+ pedal pulses, 2+ radial pulses Neuro:unresponsive     Assessment/Plan Principal Problem:   Cardiac arrest (HCC) Active Problems:   Respiratory arrest (HCC)   Severe mitral regurgitation   Patent ductus arteriosus   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   Long term (current) use of anticoagulants [Z79.01]  Pt post out of hospital arrest with PEA and CPR X 2 unsure how long down before EMS arrived.  Recent MV repair and PDA closure.  Cardiac cath with patent coronary arteries 07/08/15 -Beginning cooling protocol planning stat Echo.  Follow troponin, possible aspiration.   CCM to admit.  See Dr. Darl Householder  note.   [facility] R  Nurse Practitioner Certified [facility] Pager [phone] or after 5pm or weekends call [phone] 08/05/2015, 8:11 PM   The patient was seen and examined, and I agree with the assessment and plan as documented above, with modifications as noted below. 55 yr old woman initially evaluated for syncope by Dr. Duke Salvia. At that time she had borderline QTc prolongation. Brain MRI was unremarkable. Holter monitor in 05/2015 showed predominantly sinus rhythm with no pauses and rare PAC's and PVC's.  She was also found to have severe mitral regurgitation (TTE 06/04/15) for which she ultimately underwent repair earlier this month with PDA ligation and left atrial appendage clipping. Pre-op coronary angiography demonstrated normal coronary arteries. She had  reportedly developed 1st degree and 2nd degree AV block post-op (strips unavailable) and thus a beta blocker was not initiated. She had no symptomatic bradycardia.  She then saw Dr. Cornelius Moras for a post-op office visit on 07/31/15 and was tachycardic and had a pleural effusion and was started on low-dose beta blockers and diuretics. She has a cardiomyopathy with chronic systolic and diastolic heart failure, EF 45-50%.  On warfarin with INR most recently 1.9 on 1/12.   The patient called earlier today to report some hematuria. INR was scheduled to be checked this Friday.  Subsequently, tonight's events occurred as mentioned above. All ECG's and rhythm strips reviewed, currently in atrial fibrillation with a LBBB.  Unclear if arrhythmogenic event precipitated LOC or if patient choked and then became unresponsive. Regardless, has likely aspirated in the interim. Event was not witnessed. No family here to speak with. Doubt ischemic event given normal coronaries pre-op. Has had VT which required shock x 3. Have ordered stat echocardiogram which preliminarily demonstrated normal LV systolic function, EF>55%, normal RV  size and systolic function, normal MV repair, and no large pericardial effusion. There did appear to be a pleural effusion.  Labs today pending. CXR pending. Stat echo ordered. Discussed with Dr. Mayford Knife who is on call and will follow up on results.  I will initiate amiodarone bolus and infusion given prior episodes of ventricular tachycardia. Discussed with critical care team.  Prentice Docker, MD, [facility]  08/05/2015 8:20 PM   Time spent: 90 minutes

## 2015-08-05 NOTE — Progress Notes (Signed)
Patient transported to CT from ED then from  CT to 2H without any complications. RT will continue to monitor

## 2015-08-05 NOTE — Progress Notes (Signed)
  Echocardiogram 2D Echocardiogram has been performed.  Delcie Roch 08/05/2015, 9:28 PM

## 2015-08-05 NOTE — ED Notes (Signed)
Critical care at bedside  

## 2015-08-05 NOTE — Progress Notes (Signed)
RT decrease VT to 8CC 400 and RR to 18 per ABG results.

## 2015-08-05 NOTE — H&P (Signed)
Name: Diane [name] MRN: [id number] DOB: 02/06/1961    LOS: 0  Referring Provider:  EDP Reason for Referral:  Cardiac arrest requiring intubation and vent management, hypothermia protocol  PULMONARY / CRITICAL CARE MEDICINE  HPI: History is obtained from family, medical staff, and chart review as patient is intubated and unresponsive. Diane [name] is a 55 year old female with PMH of severe mitral regurgitaion s/p mitral valve repair on 07/22/2015, NICM, chronic combined systolic/diastolic CHF (EF 40-98% from TEE 07/07/2015 who presented to [facility] ED after cardiac arrest. She has recently been evaluated by Cardiology for increasing shortness of breath and syncopal episodes. Workup was revealing for severe mitral regurgitation. Coronary angiography on 07/08/2015 for pre-op evaluation showed angiographically normal coronary arteries. Patient underwent mitral valve repair with annuloplasty an ligation of PDA on 07/22/2015. She had post-op heart block which stabilized prior to discharge from hospital. She was started on Coumadin with INR goal of 2.0-3.0. Last INR on 1/12 was 1.9. On her follow up out patient visit she was noted to have some increased SOB, lower extremity swelling, and several pounds increase in weight.  Patient's daughter reports that since her surgery, she has been feeling more tired than usual, taking several naps throughout the day. Today, daughter states that patient was having a good day and proud of herself that she did not need to take a nap. Her only complaint was some noted hematuria. She had dinner around 5 pm and was able to eat without issue. About 1 hour later, daughter reports that her mother suddenly fell to the floor without warning. CPR was begun until EMS arrived. She was in PEA and CPR was continued for a total of about 7 minutes until ROSC. Pulse was lost en route to the ED and CPR was restarted. Intubation was attempted en route and patient vomited with likely  aspiration. Patient was noted to be in VTach on 2nd round of CPR which lasted for 7-8 minutes. Total pulseless time was approximately 15 minutes. Patient was unresponsive and intubated in the ED and started on Epi drip and hypothermic protocol.   Past Medical History  Diagnosis Date  . Shortness of breath 05/29/2015  . Severe mitral regurgitation 07/03/2015  . Chronic combined systolic and diastolic CHF (congestive heart failure) (HCC) 07/03/2015    Grade 3 diastolic dysfunction.  LVEF 40-45% 05/2015.  Marland Kitchen Aortic aneurysm (HCC)   . Patent ductus arteriosus 07/16/2015  . Depression     SOME DEPRESSION (ON MEDS FOR 2 YRS) WHEN HER MOTHER PASSED  . 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 07/22/2015    26 mm Sorin Memo 3D ring annuloplasty with ligation of patent ductus arteriosus and clipping of LA appendage   Past Surgical History  Procedure Laterality Date  . Ovarian cyst removal    . Tee without cardioversion N/A 07/07/2015    Procedure: TRANSESOPHAGEAL ECHOCARDIOGRAM (TEE);  Surgeon: Chilton Si, MD;  Location: [facility] ENDOSCOPY;  Service: Cardiovascular;  Laterality: N/A;  . Cardiac catheterization N/A 07/08/2015    Procedure: Right/Left Heart Cath and Coronary Angiography;  Surgeon: Marykay Lex, MD;  Location: [facility] INVASIVE CV LAB;  Service: Cardiovascular;  Laterality: N/A;  . Mitral valve repair N/A 07/22/2015    Procedure: MITRAL VALVE REPAIR;  Surgeon: Purcell Nails, MD;  Location: [facility] OR;  Service: Open Heart Surgery;  Laterality: N/A;  26 Sorin Memo 3D  . Tee without cardioversion N/A 07/22/2015    Procedure: TRANSESOPHAGEAL ECHOCARDIOGRAM (TEE);  Surgeon: Purcell Nails, MD;  Location: [facility] OR;  Service: Open Heart Surgery;  Laterality: N/A;  . Patent ductus arterious repair N/A 07/22/2015    Procedure: PATENT DUCTUS ARTERIOSUS (PDA) ligation;  Surgeon: Purcell Nails, MD;  Location: [facility] OR;  Service: Open Heart Surgery;  Laterality: N/A;  . Clipping of atrial  appendage  07/22/2015    Procedure: CLIPPING OF LEFT ATRIAL APPENDAGE;  Surgeon: Purcell Nails, MD;  Location: [facility] OR;  Service: Open Heart Surgery;;  35 Atriclip Pro   Prior to Admission medications   Medication Sig Start Date End Date Taking? Authorizing Provider  aspirin EC 81 MG EC tablet Take 1 tablet (81 mg total) by mouth daily. 07/28/15   Wayne E Gold, PA-C  furosemide (LASIX) 20 MG tablet Take 1 tablet (20 mg total) by mouth daily. 07/31/15   Purcell Nails, MD  metoprolol tartrate (LOPRESSOR) 25 MG tablet Take 0.5 tablets (12.5 mg total) by mouth 2 (two) times daily. 07/31/15   Purcell Nails, MD  Multiple Vitamin (MULTIVITAMIN) tablet Take 1 tablet by mouth daily.    Historical Provider, MD  naproxen sodium (ANAPROX) 220 MG tablet Take 220 mg by mouth 2 (two) times daily as needed (for pain).    Historical Provider, MD  potassium chloride (K-DUR,KLOR-CON) 10 MEQ tablet Take 1 tablet (10 mEq total) by mouth daily. 07/31/15   Purcell Nails, MD  psyllium (REGULOID) 0.52 G capsule Take 0.52 g by mouth daily.    Historical Provider, MD  traMADol (ULTRAM) 50 MG tablet Take 1-2 tablets (50-100 mg total) by mouth every 6 (six) hours as needed for moderate pain. 07/28/15   Rowe Clack, PA-C  warfarin (COUMADIN) 1 MG tablet Take 2 tablets (2 mg total) by mouth daily at 6 PM. 07/28/15   Rowe Clack, PA-C   Allergies Allergies  Allergen Reactions  . Vicodin [Hydrocodone-Acetaminophen] Other (See Comments)    "she feels out of it", hallucinating  . Adhesive [Tape] Itching and Rash  . Silicone Itching and Rash    TAPE ALLERGY/EKG STICKER ALLERGY, Please use "paper" tape only    Family History No family history on file. Social History  reports that she has never smoked. She has never used smokeless tobacco. She reports that she does not drink alcohol or use illicit drugs.  Review Of Systems:  Unable to obtain from patient while intubated and unresponsive.   Studies: 1/17 pCXR >> diffuse  bilateral airspace opacification, possibly aspiration PNA vs pulonary edema 1/17 CTA chest >>  Cultures:  Antibiotics: Unasyn 1/17 >>   Events Since Admission: 1/17 Admitted after cardiac arrest, intubated, started hypothermia protocol   Vital Signs: Temp:  [93 F (33.9 C)-96.6 F (35.9 C)] 93 F (33.9 C) (01/17 2130) Pulse Rate:  [62-87] 82 (01/17 2130) Resp:  [14-20] 20 (01/17 2130) BP: (77-154)/(52-80) 154/80 mmHg (01/17 2130) SpO2:  [90 %-100 %] 100 % (01/17 2130) FiO2 (%):  [100 %] 100 % (01/17 2153)  Physical Examination: General: intubated on vent, unresponsive, ice packs in place Neuro:  Unresponsive to noxious stimuli HEENT:  [place]/AT, pupils dilated and reactive Neck: no appreciable carotid bruit Cardiovascular:  RRR, no murmur/rub/gallop appreciated Lungs: wheezing bilaterally, on mechanical ventilation  Abdomen: soft, distended, +BS Musculoskeletal: trace pedal edema Skin:  Midline sternal surgical scar  Vent Mode:  [-] PRVC FiO2 (%):  [100 %] 100 % Set Rate:  [14 bmp-18 bmp] 18 bmp Vt Set:  [400 mL-500 mL] 400 mL PEEP:  [  5 cmH20-10 cmH20] 10 cmH20 Plateau Pressure:  [45 cmH20] 45 cmH20   Principal Problem:   Cardiac arrest (HCC) Active Problems:   Severe mitral regurgitation   Patent ductus arteriosus   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   Long term (current) use of anticoagulants [Z79.01]   Respiratory arrest [facility])  Brief patient description:  55 y/o F with NICM, CHF, s/p mitral valve repair on 07/22/15 presenting after sudden cardiac arrest, noted to have PEA then Vtach with approximately 15 minutes pulseless time. She was unresponsive requiring intubation and Epi drip for BP maintenance.   ASSESSMENT AND PLAN  PULMONARY A: Intubation to protect airway Aspiration Pneumonia ?PE P:   Continue full vent support pCXR in AM ABG Abx as below CTA ordered in ED, f/u results  CARDIOVASCULAR A: S/p Cardiac arrest,  PEA and Vtach, ? From arrhyhtmia, PE, valvular disease Severe mitral regurgitation s/p repair Atrial fibrillation LBBB P:  Cardiology following, assistance appreciated Hypothermic protocol with goal temp 33C for 24 hours Place central line F/u TTE Amiodarone D/c Epi drip Levophed CVP monitoring Rpt EKG  RENAL A: Hyponatremia Lactic acidosis Hypothermia protocol -> monitor for electrolyte abnormalities P:   Monitor metabolic panel and UOP Serial BMET per hypothermic protocol Replace electrolytes as needed Check Mg, Phos  GASTROINTESTINAL A: GI prophylaxis P:   Pantoprazole  HEMATOLOGIC A: Leukocytosis Anticoagulation for mitral valve repair Normocytic anemia DVT ppx P:  SCDs, consider Heparin anticoagulation Monitor for GI bleed Monitor CBC, PT/INR  INFECTIOUS A: Likely aspiration PNA P:   Unasyn  ENDOCRINE A: Hyperglycemia P:   Monitor CBGs Consider SSI  NEUROLOGIC A: Sedation on vent P:   EEG Fentanyl, Versed Consider neurology consult as indicated, continuous EEG if appropriate  FAMILY Family updated in the ED, discussions led by Dr. Kendrick Fries.  Darreld Mclean, M.D. Internal Medicine PGY1 Pager: [phone]  08/05/2015, 11:33 PM

## 2015-08-06 ENCOUNTER — Inpatient Hospital Stay (HOSPITAL_COMMUNITY): Payer: BLUE CROSS/BLUE SHIELD

## 2015-08-06 ENCOUNTER — Encounter: Payer: BLUE CROSS/BLUE SHIELD | Admitting: Pharmacist Clinician (PhC)/ Clinical Pharmacy Specialist

## 2015-08-06 DIAGNOSIS — Z7901 Long term (current) use of anticoagulants: Secondary | ICD-10-CM

## 2015-08-06 DIAGNOSIS — R570 Cardiogenic shock: Secondary | ICD-10-CM

## 2015-08-06 DIAGNOSIS — J9601 Acute respiratory failure with hypoxia: Secondary | ICD-10-CM

## 2015-08-06 DIAGNOSIS — G931 Anoxic brain damage, not elsewhere classified: Secondary | ICD-10-CM

## 2015-08-06 DIAGNOSIS — Z9889 Other specified postprocedural states: Secondary | ICD-10-CM

## 2015-08-06 LAB — GLUCOSE, CAPILLARY
GLUCOSE-CAPILLARY: 157 mg/dL — AB (ref 65–99)
GLUCOSE-CAPILLARY: 179 mg/dL — AB (ref 65–99)
GLUCOSE-CAPILLARY: 143 mg/dL — AB (ref 65–99)
GLUCOSE-CAPILLARY: 119 mg/dL — AB (ref 65–99)
GLUCOSE-CAPILLARY: 108 mg/dL — AB (ref 65–99)
GLUCOSE-CAPILLARY: 84 mg/dL (ref 65–99)
Glucose-Capillary: 278 mg/dL — ABNORMAL HIGH (ref 65–99)
Glucose-Capillary: 240 mg/dL — ABNORMAL HIGH (ref 65–99)
Glucose-Capillary: 218 mg/dL — ABNORMAL HIGH (ref 65–99)

## 2015-08-06 LAB — POCT I-STAT, CHEM 8
BUN: 10 mg/dL (ref 6–20)
BUN: 9 mg/dL (ref 6–20)
BUN: 8 mg/dL (ref 6–20)
BUN: 8 mg/dL (ref 6–20)
CALCIUM ION: 1.11 mmol/L — AB (ref 1.12–1.23)
CHLORIDE: 96 mmol/L — AB (ref 101–111)
CHLORIDE: 97 mmol/L — AB (ref 101–111)
Calcium, Ion: 1.12 mmol/L (ref 1.12–1.23)
Calcium, Ion: 1.11 mmol/L — ABNORMAL LOW (ref 1.12–1.23)
Calcium, Ion: 1.1 mmol/L — ABNORMAL LOW (ref 1.12–1.23)
Chloride: 95 mmol/L — ABNORMAL LOW (ref 101–111)
Chloride: 97 mmol/L — ABNORMAL LOW (ref 101–111)
Creatinine, Ser: 0.3 mg/dL — ABNORMAL LOW (ref 0.44–1.00)
Creatinine, Ser: 0.3 mg/dL — ABNORMAL LOW (ref 0.44–1.00)
Creatinine, Ser: 0.3 mg/dL — ABNORMAL LOW (ref 0.44–1.00)
Creatinine, Ser: 0.3 mg/dL — ABNORMAL LOW (ref 0.44–1.00)
GLUCOSE: 109 mg/dL — AB (ref 65–99)
GLUCOSE: 94 mg/dL (ref 65–99)
Glucose, Bld: 127 mg/dL — ABNORMAL HIGH (ref 65–99)
Glucose, Bld: 97 mg/dL (ref 65–99)
HCT: 35 % — ABNORMAL LOW (ref 36.0–46.0)
HEMATOCRIT: 35 % — AB (ref 36.0–46.0)
HEMATOCRIT: 36 % (ref 36.0–46.0)
HEMATOCRIT: 34 % — AB (ref 36.0–46.0)
HEMOGLOBIN: 11.9 g/dL — AB (ref 12.0–15.0)
HEMOGLOBIN: 12.2 g/dL (ref 12.0–15.0)
HEMOGLOBIN: 11.9 g/dL — AB (ref 12.0–15.0)
Hemoglobin: 11.6 g/dL — ABNORMAL LOW (ref 12.0–15.0)
POTASSIUM: 3.5 mmol/L (ref 3.5–5.1)
POTASSIUM: 3.3 mmol/L — AB (ref 3.5–5.1)
POTASSIUM: 4.2 mmol/L (ref 3.5–5.1)
POTASSIUM: 3.6 mmol/L (ref 3.5–5.1)
SODIUM: 133 mmol/L — AB (ref 135–145)
SODIUM: 134 mmol/L — AB (ref 135–145)
SODIUM: 132 mmol/L — AB (ref 135–145)
SODIUM: 133 mmol/L — AB (ref 135–145)
TCO2: 25 mmol/L (ref 0–100)
TCO2: 25 mmol/L (ref 0–100)
TCO2: 22 mmol/L (ref 0–100)
TCO2: 20 mmol/L (ref 0–100)

## 2015-08-06 LAB — BASIC METABOLIC PANEL
ANION GAP: 11 (ref 5–15)
ANION GAP: 13 (ref 5–15)
ANION GAP: 14 (ref 5–15)
ANION GAP: 7 (ref 5–15)
BUN: 11 mg/dL (ref 6–20)
BUN: 13 mg/dL (ref 6–20)
BUN: 14 mg/dL (ref 6–20)
BUN: 15 mg/dL (ref 6–20)
CHLORIDE: 90 mmol/L — AB (ref 101–111)
CHLORIDE: 92 mmol/L — AB (ref 101–111)
CHLORIDE: 95 mmol/L — AB (ref 101–111)
CHLORIDE: 99 mmol/L — AB (ref 101–111)
CO2: 22 mmol/L (ref 22–32)
CO2: 22 mmol/L (ref 22–32)
CO2: 23 mmol/L (ref 22–32)
CO2: 24 mmol/L (ref 22–32)
CREATININE: 0.52 mg/dL (ref 0.44–1.00)
CREATININE: 0.55 mg/dL (ref 0.44–1.00)
CREATININE: 0.65 mg/dL (ref 0.44–1.00)
CREATININE: 0.73 mg/dL (ref 0.44–1.00)
Calcium: 7.9 mg/dL — ABNORMAL LOW (ref 8.9–10.3)
Calcium: 8.1 mg/dL — ABNORMAL LOW (ref 8.9–10.3)
Calcium: 8.1 mg/dL — ABNORMAL LOW (ref 8.9–10.3)
Calcium: 8.3 mg/dL — ABNORMAL LOW (ref 8.9–10.3)
GFR calc non Af Amer: >60 mL/min (ref >60)
GFR calc non Af Amer: >60 mL/min (ref >60)
GFR calc non Af Amer: >60 mL/min (ref >60)
GFR calc non Af Amer: >60 mL/min (ref >60)
Glucose, Bld: 163 mg/dL — ABNORMAL HIGH (ref 65–99)
Glucose, Bld: 239 mg/dL — ABNORMAL HIGH (ref 65–99)
Glucose, Bld: 282 mg/dL — ABNORMAL HIGH (ref 65–99)
Glucose, Bld: 283 mg/dL — ABNORMAL HIGH (ref 65–99)
POTASSIUM: 3 mmol/L — AB (ref 3.5–5.1)
POTASSIUM: 3.5 mmol/L (ref 3.5–5.1)
POTASSIUM: 4.3 mmol/L (ref 3.5–5.1)
Potassium: 4.2 mmol/L (ref 3.5–5.1)
SODIUM: 126 mmol/L — AB (ref 135–145)
SODIUM: 127 mmol/L — AB (ref 135–145)
SODIUM: 129 mmol/L — AB (ref 135–145)
SODIUM: 130 mmol/L — AB (ref 135–145)

## 2015-08-06 LAB — BLOOD GAS, ARTERIAL
ACID-BASE DEFICIT: 1.2 mmol/L (ref 0.0–2.0)
BICARBONATE: 22.7 meq/L (ref 20.0–24.0)
Drawn by: 44166
FIO2: 1
O2 SAT: 99.8 %
PATIENT TEMPERATURE: 89.6
PCO2 ART: 27.8 mmHg — AB (ref 35.0–45.0)
PEEP/CPAP: 10 cmH2O
PH ART: 7.496 — AB (ref 7.350–7.450)
RATE: 18 resp/min
TCO2: 23.7 mmol/L (ref 0–100)
VT: 400 mL
pO2, Arterial: 234 mmHg — ABNORMAL HIGH (ref 80.0–100.0)

## 2015-08-06 LAB — OSMOLALITY: Osmolality: 272 mOsm/kg — ABNORMAL LOW (ref 275–295)

## 2015-08-06 LAB — CBC
HCT: 31.7 % — ABNORMAL LOW (ref 36.0–46.0)
Hemoglobin: 11.1 g/dL — ABNORMAL LOW (ref 12.0–15.0)
MCH: 32.4 pg (ref 26.0–34.0)
MCHC: 35 g/dL (ref 30.0–36.0)
MCV: 92.4 fL (ref 78.0–100.0)
PLATELETS: 394 10*3/uL (ref 150–400)
RBC: 3.43 MIL/uL — ABNORMAL LOW (ref 3.87–5.11)
RDW: 14.2 % (ref 11.5–15.5)
WBC: 39.1 10*3/uL — ABNORMAL HIGH (ref 4.0–10.5)

## 2015-08-06 LAB — PROTIME-INR
INR: 2.54 — ABNORMAL HIGH (ref 0.00–1.49)
INR: 2.71 — AB (ref 0.00–1.49)
PROTHROMBIN TIME: 27 s — AB (ref 11.6–15.2)
PROTHROMBIN TIME: 28.4 s — AB (ref 11.6–15.2)

## 2015-08-06 LAB — TROPONIN I
TROPONIN I: 0.52 ng/mL — AB (ref <0.031)
Troponin I: 0.31 ng/mL — ABNORMAL HIGH (ref <0.031)
Troponin I: 0.58 ng/mL (ref <0.031)
Troponin I: 0.65 ng/mL (ref <0.031)

## 2015-08-06 LAB — SODIUM, URINE, RANDOM: Sodium, Ur: 96 mmol/L

## 2015-08-06 LAB — APTT
APTT: 56 s — AB (ref 24–37)
aPTT: 38 seconds — ABNORMAL HIGH (ref 24–37)

## 2015-08-06 LAB — OSMOLALITY, URINE: OSMOLALITY UR: 452 mosm/kg (ref 300–900)

## 2015-08-06 LAB — PHOSPHORUS: Phosphorus: 1.4 mg/dL — ABNORMAL LOW (ref 2.5–4.6)

## 2015-08-06 LAB — MAGNESIUM: Magnesium: 1.9 mg/dL (ref 1.7–2.4)

## 2015-08-06 LAB — MRSA PCR SCREENING: MRSA by PCR: NEGATIVE

## 2015-08-06 MED ORDER — POTASSIUM CHLORIDE 10 MEQ/50ML IV SOLN
10.0000 meq | INTRAVENOUS | Status: AC
  Administered 2015-08-06 (×4): 10 meq via INTRAVENOUS
  Filled 2015-08-06 (×4): qty 50

## 2015-08-06 MED ORDER — SODIUM CHLORIDE 0.9 % IJ SOLN
10.0000 mL | Freq: Two times a day (BID) | INTRAMUSCULAR | Status: DC
  Administered 2015-08-06 – 2015-08-08 (×4): 10 mL
  Administered 2015-08-09: 20 mL
  Administered 2015-08-10 – 2015-08-13 (×7): 10 mL

## 2015-08-06 MED ORDER — SODIUM CHLORIDE 0.9 % IJ SOLN: 10.0000 mL | INTRAMUSCULAR | Status: DC | PRN

## 2015-08-06 MED ORDER — CHLORHEXIDINE GLUCONATE 0.12% ORAL RINSE (MEDLINE KIT)
15.0000 mL | Freq: Two times a day (BID) | OROMUCOSAL | Status: DC
  Administered 2015-08-06 – 2015-08-07 (×3): 15 mL via OROMUCOSAL

## 2015-08-06 MED ORDER — MAGNESIUM SULFATE 2 GM/50ML IV SOLN
INTRAVENOUS | Status: AC
  Administered 2015-08-06: 2 g via INTRAVENOUS
  Filled 2015-08-06: qty 50

## 2015-08-06 MED ORDER — MAGNESIUM SULFATE 2 GM/50ML IV SOLN
2.0000 g | Freq: Once | INTRAVENOUS | Status: AC
  Administered 2015-08-06: 2 g via INTRAVENOUS

## 2015-08-06 MED ORDER — POTASSIUM CHLORIDE 20 MEQ/15ML (10%) PO SOLN
40.0000 meq | Freq: Once | ORAL | Status: AC
  Administered 2015-08-06: 40 meq
  Filled 2015-08-06: qty 30

## 2015-08-06 MED ORDER — SODIUM PHOSPHATE 3 MMOLE/ML IV SOLN
20.0000 mmol | Freq: Once | INTRAVENOUS | Status: AC
  Administered 2015-08-06: 20 mmol via INTRAVENOUS
  Filled 2015-08-06 (×2): qty 6.67

## 2015-08-06 MED ORDER — POTASSIUM CHLORIDE 10 MEQ/50ML IV SOLN
10.0000 meq | INTRAVENOUS | Status: AC
  Administered 2015-08-06 (×2): 10 meq via INTRAVENOUS

## 2015-08-06 MED ORDER — POTASSIUM CHLORIDE 10 MEQ/50ML IV SOLN
INTRAVENOUS | Status: AC
  Filled 2015-08-06: qty 100

## 2015-08-06 MED ORDER — ANTISEPTIC ORAL RINSE SOLUTION (CORINZ)
7.0000 mL | Freq: Four times a day (QID) | OROMUCOSAL | Status: DC
  Administered 2015-08-06 – 2015-08-07 (×5): 7 mL via OROMUCOSAL

## 2015-08-06 MED ORDER — INSULIN ASPART 100 UNIT/ML ~~LOC~~ SOLN
0.0000 [IU] | SUBCUTANEOUS | Status: DC
  Administered 2015-08-06 (×2): 3 [IU] via SUBCUTANEOUS
  Administered 2015-08-07 – 2015-08-08 (×4): 2 [IU] via SUBCUTANEOUS
  Administered 2015-08-08 – 2015-08-10 (×2): 1 [IU] via SUBCUTANEOUS
  Administered 2015-08-10: 2 [IU] via SUBCUTANEOUS
  Administered 2015-08-10: 1 [IU] via SUBCUTANEOUS

## 2015-08-06 MED ORDER — NOREPINEPHRINE BITARTRATE 1 MG/ML IV SOLN
0.0000 ug/min | INTRAVENOUS | Status: DC
  Administered 2015-08-06: 7 ug/min via INTRAVENOUS
  Filled 2015-08-06 (×2): qty 16

## 2015-08-06 MED FILL — Medication: Qty: 1 | Status: AC

## 2015-08-06 NOTE — Progress Notes (Signed)
eLink Physician-Brief Progress Note Patient Name: Diane [name] DOB: Jan 04, 1961 MRN: [id number]   Date of Service  08/06/2015  HPI/Events of Note  Brief periods brady into 30's, without drop in BP  eICU Interventions  Dc amio, observe, may had dop and change to normo if continues     Intervention Category Major Interventions: Arrhythmia - evaluation and management  Timmie Dugue J. 08/06/2015, 3:44 AM

## 2015-08-06 NOTE — Care Management Note (Signed)
Case Management Note  Patient Details  Name: Diane [name] MRN: [id number] Date of Birth: 1960-12-24  Subjective/Objective:          Adm w cardiac arrest, vent          Action/Plan: lives alone, pcp dr Paulino Rily  Expected Discharge Date:                  Expected Discharge Plan:     In-House Referral:     Discharge planning Services     Post Acute Care Choice:    Choice offered to:     DME Arranged:    DME Agency:     HH Arranged:    HH Agency:     Status of Service:     Medicare Important Message Given:    Date Medicare IM Given:    Medicare IM give by:    Date Additional Medicare IM Given:    Additional Medicare Important Message give by:     If discussed at Long Length of Stay Meetings, dates discussed:    Additional Comments:ur review done  Hanley Hays, RN 08/06/2015, 8:33 AM

## 2015-08-06 NOTE — Progress Notes (Addendum)
PULMONARY / CRITICAL CARE MEDICINE   Name: Diane [name] MRN: [id number] DOB: [birth year]    ADMISSION DATE:  08/05/2015   REFERRING MD:  ED  CHIEF COMPLAINT:  Cardiac Arrest  HISTORY OF PRESENT ILLNESS:   Diane [name] is a 55 y.o. female w/ PMHx of severe MR s/p repair 07/22/15, NICM, chronic combined CHF, presented to [facility] ED after cardiac arrest. Patient was recently evaluated by cardiology for increasing SOB and syncopal episodes, workup was significant for severe MR at that time. Cardiac cath on 07/08/2015 for pre-op evaluation showed normal coronary arteries. Patient underwent MVR with annuloplasty and ligation of PDA on 07/22/2015. She had post-op heart block which stabilized prior to discharge from hospital. She was started on Coumadin with INR goal of 2.0-3.0. Last INR on 1/12 was 1.9. On her follow up outpatient visit she was noted to have some increased SOB, lower extremity swelling, and several pounds increase in weight. Per chart review, her daughter has noted she has been more tired than usual since her MVR. Yesterday evening about 1 hour after dinner, patient fell to the floor at home and was unresponsive. CPR was started by family and upon EMS arrival, patient was found to be in PEA arrest with ROSC. Patient then lost pulse again en route to the hospital, found to be in VT which lasted about 7-8 minutes. Total pulseless time ~15 minutes. Attempted intubation en route, however, patient vomited and had likely aspiration. Patient was unresponsive on arrival to the ED, intubated, started on Epi gtt, and put on hypothermia protocol.    SUBJECTIVE:  Hypothermia protocol. Intermittent changes in BP with changes in HR. EKG shows junctional rhythm/trigemy appearing rhythm, now in NSR at bedside. BP stable on minimal Levophed.    VITAL SIGNS: BP 111/70 mmHg  Pulse 83  Temp(Src) 91.4 F (33 C) (Core (Comment))  Resp 18  Ht 5\' 2"  (1.575 m)  Wt 59.3 kg (130 lb 11.7 oz)  BMI 23.91 kg/m2   SpO2 100%  HEMODYNAMICS: CVP:  [8 mmHg-9 mmHg] 9 mmHg  VENTILATOR SETTINGS: Vent Mode:  [-] PRVC FiO2 (%):  [50 %-100 %] 50 % Set Rate:  [14 bmp-18 bmp] 18 bmp Vt Set:  [400 mL-500 mL] 400 mL PEEP:  [5 cmH20-10 cmH20] 8 cmH20 Plateau Pressure:  [22 cmH20-45 cmH20] 22 cmH20  INTAKE / OUTPUT: I/O last 3 completed shifts: In: 1152.8 [I.V.:532.8; NG/GT:60; IV Piggyback:560] Out: 1950 [Urine:1950]  PHYSICAL EXAMINATION: General:  Intubated, sedated, hypothermic, paralyzed.  Neuro:  Pupils 2 mm, sluggish. Sedated.  HEENT:  ETT/OGT in place. Dried blood on upper lip. Moist mucus membranes.  Cardiovascular:  RRR, no murmurs, gallops, rubs.  Lungs:  Air entry equal bilaterally. Coarse breath sounds.  Abdomen:  Arctic sun pads in place.  Musculoskeletal:  No edema. Faint distal pulses.  Skin:  Pallor.   LABS:  BMET  Recent Labs Lab 08/06/15 0200 08/06/15 0400 08/06/15 0634 08/06/15 1009  NA 127* 130* 129* 133*  K 3.5 4.3 4.2 3.5  CL 92* 95* 99* 96*  CO2 22 24 23   --   BUN 15 13 11 10   CREATININE 0.73 0.55 0.52 0.30*  GLUCOSE 282* 239* 163* 127*   Electrolytes  Recent Labs Lab 08/05/15 2135  08/06/15 0200 08/06/15 0400 08/06/15 0634  CALCIUM  --   < > 8.1* 8.3* 8.1*  MG 2.3  --   --  1.9  --   PHOS  --   --   --  1.4*  --   < > =  values in this interval not displayed.  CBC  Recent Labs Lab 08/05/15 2006 08/05/15 2008 08/06/15 0400 08/06/15 1009  WBC 33.9*  --  39.1*  --   HGB 9.2* 11.2* 11.1* 11.9*  HCT 28.8* 33.0* 31.7* 35.0*  PLT 354  --  394  --    Coag's  Recent Labs Lab 08/05/15 2135 08/05/15 2351 08/06/15 0634  APTT  --  56* 38*  INR 2.51* 2.71* 2.54*   Sepsis Markers  Recent Labs Lab 08/05/15 2007 08/05/15 2135  LATICACIDVEN 5.7* 5.4*   ABG  Recent Labs Lab 08/05/15 2148 08/05/15 2340 08/06/15 0345  PHART 7.445 7.407 7.496*  PCO2ART 28.7* 33.0* 27.8*  PO2ART 66.0* 56.0* 234*   Liver Enzymes  Recent Labs Lab  08/05/15 2006  AST 38  ALT 33  ALKPHOS 76  BILITOT 0.8  ALBUMIN 2.6*   Cardiac Enzymes  Recent Labs Lab 08/06/15 0200 08/06/15 0400 08/06/15 0845  TROPONINI 0.52* 0.58* 0.65*    Glucose  Recent Labs Lab 08/06/15 0331 08/06/15 0357 08/06/15 0512 08/06/15 0655 08/06/15 0827 08/06/15 1216  GLUCAP 157* 218* 179* 143* 119* 108*    Imaging Ct Angio Chest Pe W/cm &/or Wo Cm  08/05/2015  CLINICAL DATA:  55 year old female with shortness of breath. Status post CPR. EXAM: CT ANGIOGRAPHY CHEST WITH CONTRAST TECHNIQUE: Multidetector CT imaging of the chest was performed using the standard protocol during bolus administration of intravenous contrast. Multiplanar CT image reconstructions and MIPs were obtained to evaluate the vascular anatomy. CONTRAST:  80mL OMNIPAQUE IOHEXOL 350 MG/ML SOLN COMPARISON:  Radiograph dated 08/05/2015 FINDINGS: An endotracheal tube is noted with tip at the carina tilting towards the right mainstem bronchus. Recommend retraction and repositioning by approximately 3- 4 cm. Moderate bilateral pleural effusions. There is complete consolidation of the lower lobes bilaterally with air bronchograms. There is partial consolidative changes of the left upper lobe and lingula. There is ground-glass opacity within the lingula. Findings may represent pneumonia, or pulmonary edema or ARDS. Clinical correlation is recommended. There is no pneumothorax. The visualized thoracic aorta appears unremarkable. No CT evidence of pulmonary embolism. Top-normal cardiac size. Mechanical mitral valve. Small pockets of air noted within the right atrium, likely iatrogenic from IV injection. There is retrograde flow of contrast from the right atrium into the IVC compatible with a degree of right cardiac dysfunction. No significant pericardial effusion. Small amount of fluid along the left aortic arch in the upper mediastinum may represent complex fluid or small amount of blood/serosanguineous  fluid. Small pockets of air noted within this fluid in the upper mediastinum compatible with small pneumomediastinum. An enteric tube is partially visualized extending into the upper abdomen with tip not included in the CT. There is no axillary adenopathy. There is diffuse subcutaneous soft tissue edema and anasarca. Mild degenerative changes of the spine. Median sternotomy wires age indeterminate fracture of the left first rib, new from prior study possibly acute or subacute. No other acute fracture identified. Review of the MIP images confirms the above findings. IMPRESSION: No CT evidence of pulmonary embolism. Moderate bilateral pleural effusions with complete consolidative changes of the lower lobes and partial consolidative changes of the left upper lobe. Clinical correlation is recommended. Endotracheal tube the tip at the origin of the right mainstem bronchus. Recommend retraction and repositioning by approximately 3-4 cm. Small high attenuating fluid along the left aspect of the aortic arch may represent complex fluid or small amount of blood. These results were called by telephone at the time  of interpretation on 08/05/2015 at 10:59 pm to Dr. Marijean Niemann , who verbally acknowledged these results. Electronically Signed   By: Elgie Collard M.D.   On: 08/05/2015 23:04   Dg Chest Port 1 View  08/05/2015  CLINICAL DATA:  Central line placement.  Initial encounter. EXAM: PORTABLE CHEST 1 VIEW COMPARISON:  Chest radiograph performed earlier today at 7:10 p.m., and CTA of the chest performed earlier today at 10:15 p.m. FINDINGS: The left IJ line is noted ending about the distal SVC. The patient's endotracheal tube is noted ending 2-3 cm above the carina. An enteric tube is noted extending below the diaphragm. Vascular congestion is again noted. Note is made of small right and small to moderate left pleural effusions, and likely underlying pulmonary edema. No pneumothorax is seen. The cardiomediastinal  silhouette is mildly enlarged. The patient is status post median sternotomy. A mitral valve replacement is noted. No acute osseous abnormalities are identified. IMPRESSION: 1. Left IJ line noted ending about the distal SVC. 2. Endotracheal tube noted ending 2-3 cm above the carina. 3. Vascular congestion and mild cardiomegaly. Small right and small to moderate left pleural effusions, with likely underlying pulmonary edema. Electronically Signed   By: Roanna Raider M.D.   On: 08/05/2015 23:50   Dg Chest Portable 1 View  08/05/2015  CLINICAL DATA:  Status post CPR. Endotracheal tube placement. Initial encounter. EXAM: PORTABLE CHEST 1 VIEW COMPARISON:  Chest radiograph performed 07/31/2015 FINDINGS: The patient's endotracheal tube is noted ending overlying the right mainstem bronchus, 1 cm below the carina. This should be retracted 4 cm. Diffuse bilateral airspace opacification is noted, with a small to moderate left-sided pleural effusion. This is concerning for pulmonary edema, though pneumonia cannot be excluded. No pneumothorax is seen. The cardiomediastinal silhouette is mildly enlarged. The patient is status post median sternotomy. External pacing pads are noted. No acute osseous abnormalities are seen. IMPRESSION: 1. Endotracheal tube noted ending overlying the right mainstem bronchus, 1 cm below the carina. This should be retracted 4 cm. 2. Diffuse bilateral airspace opacification, with a small to moderate left-sided pleural effusion. This is concerning for pulmonary edema, though pneumonia cannot be excluded. 3. Mild cardiomegaly. These results were called by telephone at the time of interpretation on 08/05/2015 at 9:20 pm to Nursing at the [facility] ER, who verbally acknowledged these results. The endotracheal tube had already been repositioned. Electronically Signed   By: Roanna Raider M.D.   On: 08/05/2015 21:50     STUDIES:  1/17 CXR >>> Diffuse bilateral airspace opacification, aspiration vs  edema 1/17 CTA chest >>> No CT evidence of pulmonary embolism. Moderate bilateral pleural effusions with complete consolidative changes of the lower lobes and partial consolidative changes of the left upper lobe. Small high attenuating fluid along the left aspect of the aortic arch may represent complex fluid or small amount of blood.  CULTURES: Blood 1/18 >> Urine 1/18 >> Resp 1/18 >>  ANTIBIOTICS: Unasyn 1/17 >>  SIGNIFICANT EVENTS: 1/17 PEA arrest with ROSC, VT arrest with ROSC, intubated, hypothermia protocol  LINES/TUBES: LIJ CVL 1/17 >> L Radial A-line 1/18 >> ETT 1/17 >>  DISCUSSION: 55 y.o. female w/ PMHx of severe MR s/p repair 07/22/15, NICM, chronic combined CHF, presented to [facility] ED after cardiac arrest.  ASSESSMENT / PLAN:  PULMONARY A: VDRF Aspiration Pneumonia vs Pulmonary Edema P:  Continue full vent support SBT after rewarming Repeat CXR pending See ID  CARDIOVASCULAR A: S/p Cardiac arrest, PEA and VT (shock x3);  unknown etiology Severe MR s/p Repair on Coumadin Heart Block Post-op Atrial Fibrillation s/p LAA Clipping Normal Coronaries on Cath Chronic Combined CHF P:  Cardiology following Hypothermia protocol Amiodarone off for bradycardia in ED Levophed gtt for MAP < 85 Hold Coumadin  RENAL A: Hyponatremia; 2/2 CHF? Lactic acidosis Normal Renal Function P:  Repeat BMP in AM Replace electrolytes as needed Check Mag, Phos in AM  GASTROINTESTINAL A: GI prophylaxis Nutrition P:  Protonix NPO while hypothermia  HEMATOLOGIC A: Leukocytosis On Chronic Coumadin Normocytic anemia DVT PPx P:  SCD's Goal INR 2-3; start heparin once < 2 Monitor for bleeding Repeat CBC in AM  INFECTIOUS A: Likely Aspiration Pneumonia P:  Continue Unasyn Follow cultures  ENDOCRINE A: Hyperglycemia P:  CBG's q4h  NEUROLOGIC A: Sedation Likely Anoxic Brain Injury Paralyzed  P:  EEG this AM Consider neurology  consult Fentanyl gtt, Versed prn Nimbex gtt  FAMILY  - Updates: Family updated in ED 1/17   Lauris Chroman, MD PGY-3, Internal Medicine Pager: [phone]  08/06/2015, 1:47 PM  Attending Note:  55 year old female with history of MR s/p repair 2 wks ago who presents after PEA episode with total pulseless time of 15 minutes.  Patient was intubated and brought to the ICU.  Hypothermia protocol started.  Start warming at 10 PM tonight.  On exam, lungs with diffuse crackles and dullness to percussion.  Patient is sedated and paralyzed.  Post warming will need to evaluate neurologic status and involve neurology in AM.  INR is elevated.  Would like to perform a thora at least on the left but will wait for INR to be under 2 prior to that then perform procedure then start heparin drip.  In the meantime, continue full support.  Sister updated over the phone and daughter bedside.  The patient is critically ill with multiple organ systems failure and requires high complexity decision making for assessment and support, frequent evaluation and titration of therapies, application of advanced monitoring technologies and extensive interpretation of multiple databases.   Critical Care Time devoted to patient care services described in this note is  35  Minutes. This time reflects time of care of this signee Dr Koren Bound. This critical care time does not reflect procedure time, or teaching time or supervisory time of PA/NP/Med student/Med Resident etc but could involve care discussion time.  Alyson Reedy, M.D. [facility] Pulmonary/Critical Care Medicine. Pager: [phone]. After hours pager: [phone].

## 2015-08-06 NOTE — Progress Notes (Signed)
PULMONARY / CRITICAL CARE MEDICINE   Name: Diane [name] MRN: [id number] DOB: Aug 17, 1960    ADMISSION DATE:  08/05/2015   REFERRING MD:  ED  CHIEF COMPLAINT:  Cardiac Arrest  HISTORY OF PRESENT ILLNESS:   Diane [name] is a 55 y.o. female w/ PMHx of severe MR s/p repair 07/22/15, NICM, chronic combined CHF, presented to [facility] ED after cardiac arrest. Patient was recently evaluated by cardiology for increasing SOB and syncopal episodes, workup was significant for severe MR at that time. Cardiac cath on 07/08/2015 for pre-op evaluation showed normal coronary arteries. Patient underwent MVR with annuloplasty and ligation of PDA on 07/22/2015. She had post-op heart block which stabilized prior to discharge from hospital. She was started on Coumadin with INR goal of 2.0-3.0. Last INR on 1/12 was 1.9. On her follow up outpatient visit she was noted to have some increased SOB, lower extremity swelling, and several pounds increase in weight. Per chart review, her daughter has noted she has been more tired than usual since her MVR. Yesterday evening about 1 hour after dinner, patient fell to the floor at home and was unresponsive. CPR was started by family and upon EMS arrival, patient was found to be in PEA arrest with ROSC. Patient then lost pulse again en route to the hospital, found to be in VT which lasted about 7-8 minutes. Total pulseless time ~15 minutes. Attempted intubation en route, however, patient vomited and had likely aspiration. Patient was unresponsive on arrival to the ED, intubated, started on Epi gtt, and put on hypothermia protocol.    SUBJECTIVE:  Hypothermia protocol. Intermittent changes in BP with changes in HR. EKG shows junctional rhythm/trigemy appearing rhythm, now in NSR at bedside. BP stable on minimal Levophed.    VITAL SIGNS: BP 111/77 mmHg  Pulse 39  Temp(Src) 91.6 F (33.1 C) (Core (Comment))  Resp 18  Ht  (1.575 m)  Wt 130 lb 11.7 oz (59.3 kg)  BMI 23.91 kg/m2   SpO2 100%  HEMODYNAMICS: CVP:  [8 mmHg-9 mmHg] 9 mmHg  VENTILATOR SETTINGS: Vent Mode:  [-] PRVC FiO2 (%):  [60 %-100 %] 60 % Set Rate:  [14 bmp-18 bmp] 18 bmp Vt Set:  [400 mL-500 mL] 400 mL PEEP:  [5 cmH20-10 cmH20] 10 cmH20 Plateau Pressure:  [26 cmH20-45 cmH20] 26 cmH20  INTAKE / OUTPUT: I/O last 3 completed shifts: In: 1127.4 [I.V.:510.7; NG/GT:60; IV Piggyback:556.7] Out: 1950 [Urine:1950]  PHYSICAL EXAMINATION: General:  Intubated, sedated, hypothermic.  Neuro:  Pupils 2 mm, sluggish. Sedated.  HEENT:  ETT/OGT in place. Dried blood on upper lip. Moist mucus membranes.  Cardiovascular:  RRR, no murmurs, gallops, rubs.  Lungs:  Air entry equal bilaterally. Coarse breath sounds.  Abdomen:  Arctic sun pads in place.  Musculoskeletal:  No edema. Faint distal pulses.  Skin:  Pallor.   LABS:  BMET  Recent Labs Lab 08/05/15 2351 08/06/15 0200 08/06/15 0400  NA 126* 127* 130*  K 3.0* 3.5 4.3  CL 90* 92* 95*  CO2 BUN CREATININE 0.65 0.73 0.55  GLUCOSE 283* 282* 239*    Electrolytes  Recent Labs Lab 08/05/15 2135 08/05/15 2351 08/06/15 0200 08/06/15 0400  CALCIUM  --  7.9* 8.1* 8.3*  MG 2.3  --   --  1.9  PHOS  --   --   --  1.4*    CBC  Recent Labs Lab 08/05/15 2006 08/05/15 2008 08/06/15 0400  WBC 33.9*  --  39.1*  HGB 9.2* 11.2* 11.1*  HCT 28.8* 33.0* 31.7*  PLT 354  --  394    Coag's  Recent Labs Lab 08/05/15 2135 08/05/15 2351 08/06/15 0634  APTT  --  56* 38*  INR 2.51* 2.71* 2.54*    Sepsis Markers  Recent Labs Lab 08/05/15 2007 08/05/15 2135  LATICACIDVEN 5.7* 5.4*    ABG  Recent Labs Lab 08/05/15 2148 08/05/15 2340 08/06/15 0345  PHART 7.445 7.407 7.496*  PCO2ART 28.7* 33.0* 27.8*  PO2ART 66.0* 56.0* 234*    Liver Enzymes  Recent Labs Lab 08/05/15 2006  AST 38  ALT 33  ALKPHOS 76  BILITOT 0.8  ALBUMIN 2.6*    Cardiac Enzymes  Recent Labs Lab 08/05/15 2351 08/06/15 0200  08/06/15 0400  TROPONINI 0.31* 0.52* 0.58*    Glucose  Recent Labs Lab 08/06/15 0007 08/06/15 0148 08/06/15 0331 08/06/15 0357 08/06/15 0512 08/06/15 0655  GLUCAP 240* 278* 157* 218* 179* 143*    Imaging Ct Angio Chest Pe W/cm &/or Wo Cm  08/05/2015  CLINICAL DATA:  55 year old female with shortness of breath. Status post CPR. EXAM: CT ANGIOGRAPHY CHEST WITH CONTRAST TECHNIQUE: Multidetector CT imaging of the chest was performed using the standard protocol during bolus administration of intravenous contrast. Multiplanar CT image reconstructions and MIPs were obtained to evaluate the vascular anatomy. CONTRAST:  67mL OMNIPAQUE IOHEXOL 350 MG/ML SOLN COMPARISON:  Radiograph dated 08/05/2015 FINDINGS: An endotracheal tube is noted with tip at the carina tilting towards the right mainstem bronchus. Recommend retraction and repositioning by approximately 3- 4 cm. Moderate bilateral pleural effusions. There is complete consolidation of the lower lobes bilaterally with air bronchograms. There is partial consolidative changes of the left upper lobe and lingula. There is ground-glass opacity within the lingula. Findings may represent pneumonia, or pulmonary edema or ARDS. Clinical correlation is recommended. There is no pneumothorax. The visualized thoracic aorta appears unremarkable. No CT evidence of pulmonary embolism. Top-normal cardiac size. Mechanical mitral valve. Small pockets of air noted within the right atrium, likely iatrogenic from IV injection. There is retrograde flow of contrast from the right atrium into the IVC compatible with a degree of right cardiac dysfunction. No significant pericardial effusion. Small amount of fluid along the left aortic arch in the upper mediastinum may represent complex fluid or small amount of blood/serosanguineous fluid. Small pockets of air noted within this fluid in the upper mediastinum compatible with small pneumomediastinum. An enteric tube is partially  visualized extending into the upper abdomen with tip not included in the CT. There is no axillary adenopathy. There is diffuse subcutaneous soft tissue edema and anasarca. Mild degenerative changes of the spine. Median sternotomy wires age indeterminate fracture of the left first rib, new from prior study possibly acute or subacute. No other acute fracture identified. Review of the MIP images confirms the above findings. IMPRESSION: No CT evidence of pulmonary embolism. Moderate bilateral pleural effusions with complete consolidative changes of the lower lobes and partial consolidative changes of the left upper lobe. Clinical correlation is recommended. Endotracheal tube the tip at the origin of the right mainstem bronchus. Recommend retraction and repositioning by approximately 3-4 cm. Small high attenuating fluid along the left aspect of the aortic arch may represent complex fluid or small amount of blood. These results were called by telephone at the time of interpretation on 08/05/2015 at 10:59 pm to Dr. Marijean Niemann , who verbally acknowledged these results. Electronically Signed   By: Elgie Collard M.D.   On:  08/05/2015 23:04   Dg Chest Port 1 View  08/05/2015  CLINICAL DATA:  Central line placement.  Initial encounter. EXAM: PORTABLE CHEST 1 VIEW COMPARISON:  Chest radiograph performed earlier today at 7:10 p.m., and CTA of the chest performed earlier today at 10:15 p.m. FINDINGS: The left IJ line is noted ending about the distal SVC. The patient's endotracheal tube is noted ending 2-3 cm above the carina. An enteric tube is noted extending below the diaphragm. Vascular congestion is again noted. Note is made of small right and small to moderate left pleural effusions, and likely underlying pulmonary edema. No pneumothorax is seen. The cardiomediastinal silhouette is mildly enlarged. The patient is status post median sternotomy. A mitral valve replacement is noted. No acute osseous abnormalities are  identified. IMPRESSION: 1. Left IJ line noted ending about the distal SVC. 2. Endotracheal tube noted ending 2-3 cm above the carina. 3. Vascular congestion and mild cardiomegaly. Small right and small to moderate left pleural effusions, with likely underlying pulmonary edema. Electronically Signed   By: Roanna Raider M.D.   On: 08/05/2015 23:50   Dg Chest Portable 1 View  08/05/2015  CLINICAL DATA:  Status post CPR. Endotracheal tube placement. Initial encounter. EXAM: PORTABLE CHEST 1 VIEW COMPARISON:  Chest radiograph performed 07/31/2015 FINDINGS: The patient's endotracheal tube is noted ending overlying the right mainstem bronchus, 1 cm below the carina. This should be retracted 4 cm. Diffuse bilateral airspace opacification is noted, with a small to moderate left-sided pleural effusion. This is concerning for pulmonary edema, though pneumonia cannot be excluded. No pneumothorax is seen. The cardiomediastinal silhouette is mildly enlarged. The patient is status post median sternotomy. External pacing pads are noted. No acute osseous abnormalities are seen. IMPRESSION: 1. Endotracheal tube noted ending overlying the right mainstem bronchus, 1 cm below the carina. This should be retracted 4 cm. 2. Diffuse bilateral airspace opacification, with a small to moderate left-sided pleural effusion. This is concerning for pulmonary edema, though pneumonia cannot be excluded. 3. Mild cardiomegaly. These results were called by telephone at the time of interpretation on 08/05/2015 at 9:20 pm to Nursing at the [facility] ER, who verbally acknowledged these results. The endotracheal tube had already been repositioned. Electronically Signed   By: Roanna Raider M.D.   On: 08/05/2015 21:50     STUDIES:  1/17 CXR >>> Diffuse bilateral airspace opacification, aspiration vs edema 1/17 CTA chest >>> No CT evidence of pulmonary embolism. Moderate bilateral pleural effusions with complete consolidative changes of the lower  lobes and partial consolidative changes of the left upper lobe. Small high attenuating fluid along the left aspect of the aortic arch may represent complex fluid or small amount of blood.  CULTURES: Blood 1/18 >> Urine 1/18 >> Resp 1/18 >>  ANTIBIOTICS: Unasyn 1/17 >>  SIGNIFICANT EVENTS: 1/17 PEA arrest with ROSC, VT arrest with ROSC, intubated, hypothermia protocol  LINES/TUBES: LIJ CVL 1/17 >> L Radial A-line 1/18 >> ETT 1/17 >>  DISCUSSION: 54 y.o. female w/ PMHx of severe MR s/p repair 07/22/15, NICM, chronic combined CHF, presented to [facility] ED after cardiac arrest.  ASSESSMENT / PLAN:  PULMONARY A: VDRF Aspiration Pneumonia vs Pulmonary Edema P:  Continue full vent support SBT after rewarming Repeat CXR pending See ID  CARDIOVASCULAR A: S/p Cardiac arrest, PEA and VT (shock x3); unknown etiology Severe MR s/p Repair on Coumadin Heart Block Post-op Atrial Fibrillation s/p LAA Clipping Normal Coronaries on Cath Chronic Combined CHF P:  Cardiology following Hypothermia protocol  Amiodarone off for bradycardia in ED Levophed gtt for MAP < 85 Hold Coumadin  RENAL A: Hyponatremia; 2/2 CHF? Lactic acidosis Normal Renal Function P:  Repeat BMP in AM Replace electrolytes as needed Check Mag, Phos in AM  GASTROINTESTINAL A: GI prophylaxis Nutrition P:  Protonix NPO while hypothermia  HEMATOLOGIC A: Leukocytosis On Chronic Coumadin Normocytic anemia DVT PPx P:  SCD's Goal INR 2-3; start heparin once < 2 Monitor for bleeding Repeat CBC in AM  INFECTIOUS A: Likely Aspiration Pneumonia P:  Continue Unasyn Follow cultures  ENDOCRINE A: Hyperglycemia P:  CBG's q4h  NEUROLOGIC A: Sedation Likely Anoxic Brain Injury Paralyzed  P:  EEG this AM Consider neurology consult Fentanyl gtt, Versed prn Nimbex gtt  FAMILY  - Updates: Family updated in ED 1/17   Lauris Chroman, MD PGY-3, Internal Medicine Pager:  [phone]  08/06/2015, 7:17 AM   Alyson Reedy, M.D. [facility] Pulmonary/Critical Care Medicine. Pager: [phone]. After hours pager: [phone].

## 2015-08-06 NOTE — Progress Notes (Addendum)
[address]       [place] [address]             [phone]        CARDIOTHORACIC SURGERY PROGRESS NOTE  Subjective: Sedated, unresponsive on vent  Objective: Vital signs: BP Readings from Last 1 Encounters:  08/06/15 86/59   Pulse Readings from Last 1 Encounters:  08/06/15 80   Resp Readings from Last 1 Encounters:  08/06/15 18   Temp Readings from Last 1 Encounters:  08/06/15 91.2 F (32.9 C) Core (Comment)    Hemodynamics: CVP:  [8 mmHg-9 mmHg] 9 mmHg  Physical Exam:  Rhythm:   Afib w/ variable rate  Breath sounds: Clear anteriorly, diminished at bases  Heart sounds:  RRR w/out murmur  Incisions:  Clean and dry, sternum stable  Abdomen:  Soft, non-distended  Extremities:  Cool but adequately perfused   Intake/Output from previous day: 01/17 0701 - 01/18 0700 In: 1152.8 [I.V.:532.8; NG/GT:60; IV Piggyback:560] Out: 1950 [Urine:1950] Intake/Output this shift: Total I/O In: -  Out: 325 [Urine:325]  Lab Results:  CBC: Recent Labs  08/05/15 2006 08/05/15 2008 08/06/15 0400  WBC 33.9*  --  39.1*  HGB 9.2* 11.2* 11.1*  HCT 28.8* 33.0* 31.7*  PLT 354  --  394    BMET:  Recent Labs  08/06/15 0400 08/06/15 0634  NA 130* 129*  K 4.3 4.2  CL 95* 99*  CO2 24 23  GLUCOSE 239* 163*  BUN 13 11  CREATININE 0.55 0.52  CALCIUM 8.3* 8.1*     PT/INR:   Recent Labs  08/06/15 0634  LABPROT 27.0*  INR 2.54*    CBG (last 3)   Recent Labs  08/06/15 0512 08/06/15 0655 08/06/15 0827  GLUCAP 179* 143* 119*    ABG    Component Value Date/Time   PHART 7.496* 08/06/2015 0345   PCO2ART 27.8* 08/06/2015 0345   PO2ART 234* 08/06/2015 0345   HCO3 22.7 08/06/2015 0345   TCO2 23.7 08/06/2015 0345   ACIDBASEDEF 1.2 08/06/2015 0345   O2SAT 99.8 08/06/2015 0345    CXR: PORTABLE CHEST 1 VIEW  COMPARISON: Chest radiograph performed earlier today at 7:10 p.m., and CTA of the chest performed earlier today at 10:15  p.m.  FINDINGS: The left IJ line is noted ending about the distal SVC. The patient's endotracheal tube is noted ending 2-3 cm above the carina. An enteric tube is noted extending below the diaphragm.  Vascular congestion is again noted. Note is made of small right and small to moderate left pleural effusions, and likely underlying pulmonary edema. No pneumothorax is seen.  The cardiomediastinal silhouette is mildly enlarged. The patient is status post median sternotomy. A mitral valve replacement is noted. No acute osseous abnormalities are identified.  IMPRESSION: 1. Left IJ line noted ending about the distal SVC. 2. Endotracheal tube noted ending 2-3 cm above the carina. 3. Vascular congestion and mild cardiomegaly. Small right and small to moderate left pleural effusions, with likely underlying pulmonary edema.   Electronically Signed  By: Roanna Raider M.D.  On: 08/05/2015 23:50   CT ANGIOGRAPHY CHEST WITH CONTRAST  TECHNIQUE: Multidetector CT imaging of the chest was performed using the standard protocol during bolus administration of intravenous contrast. Multiplanar CT image reconstructions and MIPs were obtained to evaluate the vascular anatomy.  CONTRAST: 80mL OMNIPAQUE IOHEXOL 350 MG/ML SOLN  COMPARISON: Radiograph dated 08/05/2015  FINDINGS: An endotracheal tube is noted with tip at the carina tilting towards  the right mainstem bronchus. Recommend retraction and repositioning by approximately 3- 4 cm. Moderate bilateral pleural effusions. There is complete consolidation of the lower lobes bilaterally with air bronchograms. There is partial consolidative changes of the left upper lobe and lingula. There is ground-glass opacity within the lingula. Findings may represent pneumonia, or pulmonary edema or ARDS. Clinical correlation is recommended. There is no pneumothorax.  The visualized thoracic aorta appears unremarkable. No CT evidence of  pulmonary embolism. Top-normal cardiac size. Mechanical mitral valve. Small pockets of air noted within the right atrium, likely iatrogenic from IV injection. There is retrograde flow of contrast from the right atrium into the IVC compatible with a degree of right cardiac dysfunction. No significant pericardial effusion. Small amount of fluid along the left aortic arch in the upper mediastinum may represent complex fluid or small amount of blood/serosanguineous fluid. Small pockets of air noted within this fluid in the upper mediastinum compatible with small pneumomediastinum.  An enteric tube is partially visualized extending into the upper abdomen with tip not included in the CT. There is no axillary adenopathy. There is diffuse subcutaneous soft tissue edema and anasarca. Mild degenerative changes of the spine. Median sternotomy wires age indeterminate fracture of the left first rib, new from prior study possibly acute or subacute. No other acute fracture identified.  Review of the MIP images confirms the above findings.  IMPRESSION: No CT evidence of pulmonary embolism.  Moderate bilateral pleural effusions with complete consolidative changes of the lower lobes and partial consolidative changes of the left upper lobe. Clinical correlation is recommended.  Endotracheal tube the tip at the origin of the right mainstem bronchus. Recommend retraction and repositioning by approximately 3-4 cm.  Small high attenuating fluid along the left aspect of the aortic arch may represent complex fluid or small amount of blood.  These results were called by telephone at the time of interpretation on 08/05/2015 at 10:59 pm to Dr. Marijean Niemann , who verbally acknowledged these results.   Electronically Signed  By: Elgie Collard M.D.  On: 08/05/2015 23:04    Assessment/Plan:  Patient is well known to me from her recent presentation for treatment of severe symptomatic  mitral regurgitation and patent ductus arteriosus.  She originally presented with frequent dizzy spells and at least 2 syncopal episodes. She had been evaluated by her primary care physician and a neurologist. MRI of the brain and EEG were both unremarkable. She was referred for cardiology consultation and evaluated by Dr. Duke Salvia last November. Transthoracic echocardiogram revealed moderate left ventricular systolic dysfunction with ejection fraction estimated 45-50% and severe mitral regurgitation. Left heart catheterization revealed normal coronary arteries and confirmed the presence of moderate left ventricular systolic dysfunction with ejection fraction estimated 40-45%. She had severe mitral regurgitation and was referred for surgical consultation.  CT angiogram of the chest revealed the presence of a patent ductus arteriosus.  She underwent elective mitral valve repair with ligation of patent ductus arteriosus and clipping of the left atrial appendage on 07/22/2015. She was found to have type I dysfunction of the mitral valve and the valve was repaired successfully using simple ring annuloplasty. Her early postoperative recovery was notable for some first and second-degree AV block which resolved prior to hospital discharge. For this reason she was not discharged from the hospital on a beta blocker. Early postoperatively she also had prolonged nausea and anorexia which slowly improved prior to hospital discharge.  She was discharged from the hospital on warfarin for anticoagulation. She was seen in  follow-up in the office on 07/31/2015 at which time she had reported that her appetite had improved and she was eating better.  However, she had gained a fair amount of weight and developed some increased exertional shortness of breath and lower extremity edema. She was started on oral Lasix and potassium at that time. Low-dose metoprolol was also started at that time.  Her INR was checked that day at the coumadin  clinic and notably 1.9.   According to the family's report the patient had been feeling somewhat better over the last several days. She noted some hematuria without dysuria or fever yesterday morning. She telephoned our office and was instructed to go to the Coumadin clinic to have her INR checked.  By report approximately 1 hour after eating supper yesterday evening the patient suddenly collapsed. CPR was administered by her daughter until EMS arrived.  She reportedly was in VT and was cardioverted by EMS. She reportedly vomited with intubation by EMS. She was comatose on arrival to the emergency department. Following resuscitation in the emergency department the patient was started on hypothermia protocol. CT angiogram of the chest was notable for the absence of pulmonary embolus.  Transthoracic echocardiogram was performed and notable for the absence of pericardial effusion or signs of pericardial tamponade.  At present it seems that the patient's original presentation with syncope and her event yesterday evening are most consistent with some type of primary arrhythmia.  I have personally reviewed the transthoracic echocardiogram and CT angiogram of the chest performed yesterday evening.  Echocardiogram demonstrates intact mitral valve repair with no residual mitral regurgitation. Left ventricular systolic function looks mildly reduced and similar to her preoperative baseline. CT angiogram of the chest reveals no suspicious findings.  The patient does have small to moderate size bilateral pleural effusions, left greater than right.  The interpreting radiologist comments upon an area of contrast along the underside of the aortic arch which corresponds to the remnant of the patient's previous ductus arteriosus.  Laboratory data from last night and today are notable for leukocytosis which may be reactive and secondary to the patient's code event. However, some type of underlying infectious process cannot be  ruled out. Will obtain blood cultures and urine culture.  The patient should probably be treated with antibiotics for possible aspiration pneumonia.  Depending upon the patient's subsequent recovery from her event she will need EPS consultation to assist with her management and consider placement of some type of ICD. We will continue to follow along closely.     Purcell Nails, MD 08/06/2015 9:16 AM

## 2015-08-06 NOTE — Progress Notes (Signed)
eLink Physician-Brief Progress Note Patient Name: Tammi Hartvigsen DOB: May 30, 1961 MRN: [id number]   Date of Service  08/06/2015  HPI/Events of Note  Na , holding saline with pulm edema, assess serum osm, urine na, osm k low, supp Glu up, add ssi  eICU Interventions       Intervention Category Major Interventions: Electrolyte abnormality - evaluation and management  Zyliah Schier J. 08/06/2015, 12:45 AM

## 2015-08-06 NOTE — Procedures (Signed)
Arterial Catheter Insertion Procedure Note Diane [name] [id number] August 13, 1960  Procedure: Insertion of Arterial Catheter  Indications: Blood pressure monitoring    Procedure Details Consent: Risks of procedure as well as the alternatives and risks of each were explained to the (patient/caregiver).  Consent for procedure obtained. Time Out: Verified patient identification, verified procedure, site/side was marked, verified correct patient position, special equipment/implants available, medications/allergies/relevent history reviewed, required imaging and test results available.  Performed  Maximum sterile technique was used including antiseptics, cap, gloves, gown, hand hygiene and mask. Skin prep: Chlorhexidine; local anesthetic administered 20 gauge catheter was inserted into left radial artery using the Seldinger technique.  Evaluation Blood flow good; BP tracing good. Complications: No apparent complications.  Left Radial Arterial Line Placement, no complications noted. Good waveform on monitor. RN aware of placement.   Benjamine Sprague 08/06/2015

## 2015-08-06 NOTE — Progress Notes (Signed)
CRITICAL VALUE ALERT  Critical value received:  Troponin .5  Date of notification:  08/06/15  Time of notification:  0445  Critical value read back:Yes.    Nurse who received alert:  Alycia Rossetti  MD notified (1st page):  E-Link MD  Time of first page:  [phone]   Responding MD:  E-Link MD  Time MD responded:  [phone]

## 2015-08-06 NOTE — Consult Note (Signed)
ELECTROPHYSIOLOGY CONSULT NOTE    [name] MRN: [id number], DOB/AGE: [birth year] 55 y.o.  Admit date: 08/05/2015 Date of Consult: 08/06/2015   Primary Physician: Emeterio Reeve, MD Primary Cardiologist: Dr. Duke Salvia CTS: Dr. Cornelius Moras  Reason for Consultation: Cardiac arrest  HPI: HPI is obtained entirely from the chart:  Diane [name] is a 55 y.o. female who collapsed at home, record indicates unwitnessed, out of the sight of family 1 minute prior to being found and was given CPR by a family member until EMS arrived, who reportedly found her in VT and was cardioverted and intubated. Per record,  she was found in her living room after being out of sight about 1 min. EMS suctioned 150 ml fluid and CPR for 7 min with return of pulse, Then en route lost pulse again. CPR and epi. Here in ER V tach X 3 with shocks to A fib.  She was reported comatose upon arrival to ER and hypothermia protocol initiated.  The patient originally presented for evaluation of 1-1/2 years of recurrent syncope (x2)/near syncopal events, SOB/CP to her out patient providers, as per CTS note she underwent neurology evaluation that was unremarkable and in the process of cardiac evaluation noted to have severe MR, no significant CAD and was evaluated for MVR, during this w/u she was found to have PDA and underwent elective mitral valve repair with ligation of patent ductus arteriosus and clipping of the left atrial appendage on 07/22/2015.  Her PMHx otherwise is depression and appears negative for other known medical problems.   EP is being asked to see this patient for evaluation of possible arrhythmia as her primary etiology of syncope.  No family at bedside at this time.   Past Medical History  Diagnosis Date  . Shortness of breath 05/29/2015  . Severe mitral regurgitation 07/03/2015  . Chronic combined systolic and diastolic CHF (congestive heart failure) (HCC) 07/03/2015    Grade 3 diastolic  dysfunction.  LVEF 40-45% 05/2015.  Marland Kitchen Aortic aneurysm (HCC)   . Patent ductus arteriosus 07/16/2015  . Depression     SOME DEPRESSION (ON MEDS FOR 2 YRS) WHEN HER MOTHER PASSED  . 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 07/22/2015    26 mm Sorin Memo 3D ring annuloplasty with ligation of patent ductus arteriosus and clipping of LA appendage     Surgical History:  Past Surgical History  Procedure Laterality Date  . Ovarian cyst removal    . Tee without cardioversion N/A 07/07/2015    Procedure: TRANSESOPHAGEAL ECHOCARDIOGRAM (TEE);  Surgeon: Chilton Si, MD;  Location: [facility] ENDOSCOPY;  Service: Cardiovascular;  Laterality: N/A;  . Cardiac catheterization N/A 07/08/2015    Procedure: Right/Left Heart Cath and Coronary Angiography;  Surgeon: Marykay Lex, MD;  Location: [facility] INVASIVE CV LAB;  Service: Cardiovascular;  Laterality: N/A;  . Mitral valve repair N/A 07/22/2015    Procedure: MITRAL VALVE REPAIR;  Surgeon: Purcell Nails, MD;  Location: [facility] OR;  Service: Open Heart Surgery;  Laterality: N/A;  26 Sorin Memo 3D  . Tee without cardioversion N/A 07/22/2015    Procedure: TRANSESOPHAGEAL ECHOCARDIOGRAM (TEE);  Surgeon: Purcell Nails, MD;  Location: [facility] OR;  Service: Open Heart Surgery;  Laterality: N/A;  . Patent ductus arterious repair N/A 07/22/2015    Procedure: PATENT DUCTUS ARTERIOSUS (PDA) ligation;  Surgeon: Purcell Nails, MD;  Location: [facility] OR;  Service: Open Heart Surgery;  Laterality: N/A;  . Clipping of atrial appendage  07/22/2015  Procedure: CLIPPING OF LEFT ATRIAL APPENDAGE;  Surgeon: Purcell Nails, MD;  Location: [facility] OR;  Service: Open Heart Surgery;;  35 Atriclip Pro     Prescriptions prior to admission  Medication Sig Dispense Refill Last Dose  . aspirin EC 81 MG EC tablet Take 1 tablet (81 mg total) by mouth daily.   08/05/2015 at Unknown time  . furosemide (LASIX) 20 MG tablet Take 1 tablet (20 mg total) by mouth daily. 30 tablet 1 08/05/2015 at  Unknown time  . potassium chloride (K-DUR,KLOR-CON) 10 MEQ tablet Take 1 tablet (10 mEq total) by mouth daily. (Patient taking differently: Take 10 mEq by mouth at bedtime. ) 30 tablet 1 08/04/2015 at Unknown time  . warfarin (COUMADIN) 1 MG tablet Take 2 tablets (2 mg total) by mouth daily at 6 PM. 100 tablet 1 08/04/2015 at Unknown time  . metoprolol tartrate (LOPRESSOR) 25 MG tablet Take 0.5 tablets (12.5 mg total) by mouth 2 (two) times daily. 60 tablet 1   . traMADol (ULTRAM) 50 MG tablet Take 1-2 tablets (50-100 mg total) by mouth every 6 (six) hours as needed for moderate pain. (Patient not taking: Reported on 08/05/2015) 50 tablet 0 Not Taking at Unknown time    Inpatient Medications:  . sodium chloride  2,000 mL Intravenous Once  . amiodarone  150 mg Intravenous Once  . ampicillin-sulbactam (UNASYN) IV  1.5 g Intravenous Q6H  . antiseptic oral rinse  7 mL Mouth Rinse QID  . artificial tears  1 application Both Eyes 3 times per day  . chlorhexidine gluconate  15 mL Mouth Rinse BID  . insulin aspart  0-9 Units Subcutaneous 6 times per day  . pantoprazole (PROTONIX) IV  40 mg Intravenous QHS  . sodium chloride  10-40 mL Intracatheter Q12H    Allergies:  Allergies  Allergen Reactions  . Vicodin [Hydrocodone-Acetaminophen] Other (See Comments)    "she feels out of it", hallucinating  . Adhesive [Tape] Itching and Rash  . Silicone Itching and Rash    TAPE ALLERGY/EKG STICKER ALLERGY, Please use "paper" tape only    Social History   Social History  . Marital Status: Married    Spouse Name: N/A  . Number of Children: 2  . Years of Education: N/A   Occupational History  . Seamtress    Social History Main Topics  . Smoking status: Never Smoker   . Smokeless tobacco: Never Used  . Alcohol Use: No  . Drug Use: No  . Sexual Activity: Not on file   Other Topics Concern  . Not on file   Social History Narrative     No family history on file.   Review of Systems: Unable  to obtain/intubated  Physical Exam: Filed Vitals:   08/06/15 0813 08/06/15 0900 08/06/15 1147 08/06/15 1148  BP: 86/59  111/70 111/70  Pulse: 80 80 83 83  Temp:  91.2 F (32.9 C) 91.4 F (33 C)   TempSrc:  Core (Comment) Core (Comment)   Resp: 18 18 18 18   Height:      Weight:      SpO2: 100% 100% 100% 100%   GEN- The patient is intubated, sedated, paralyzed HEENT: ETT/OGT in place Lungs- vent assisted breathing, coarse breath sounds b/l Heart- Regular rate and rhythm, no murmurs, rubs or gallops GI- not examined cooling pads in place Extremities- No edema MS- no significant deformity or atrophy Skin- cool, dry,  Psych- unable to assess Neuro- unable to assess Labs:   Lab  Results  Component Value Date   WBC 39.1* 08/06/2015   HGB 11.9* 08/06/2015   HCT 35.0* 08/06/2015   MCV 92.4 08/06/2015   PLT 394 08/06/2015    Recent Labs Lab 08/05/15 2006  08/06/15 0634 08/06/15 1009  NA 127*  < > 129* 133*  K 3.4*  < > 4.2 3.5  CL 92*  < > 99* 96*  CO2 22  < > 23  --   BUN 15  < > 11 10  CREATININE 0.74  < > 0.52 0.30*  CALCIUM 7.7*  < > 8.1*  --   PROT 5.1*  --   --   --   BILITOT 0.8  --   --   --   ALKPHOS 76  --   --   --   ALT 33  --   --   --   AST 38  --   --   --   GLUCOSE 211*  < > 163* 127*  < > = values in this interval not displayed.    Radiology/Studies:   Ct Angio Chest Pe W/cm &/or Wo Cm 08/05/2015  CLINICAL DATA:  56 year old female with shortness of breath. Status post CPR. EXAM: CT ANGIOGRAPHY CHEST WITH CONTRAST TECHNIQUE: Multidetector CT imaging of the chest was performed using the standard protocol during bolus administration of intravenous contrast. Multiplanar CT image reconstructions and MIPs were obtained to evaluate the vascular anatomy. CONTRAST:  80mL OMNIPAQUE IOHEXOL 350 MG/ML SOLN COMPARISON:  Radiograph dated 08/05/2015 FINDINGS: An endotracheal tube is noted with tip at the carina tilting towards the right mainstem bronchus. Recommend  retraction and repositioning by approximately 3- 4 cm. Moderate bilateral pleural effusions. There is complete consolidation of the lower lobes bilaterally with air bronchograms. There is partial consolidative changes of the left upper lobe and lingula. There is ground-glass opacity within the lingula. Findings may represent pneumonia, or pulmonary edema or ARDS. Clinical correlation is recommended. There is no pneumothorax. The visualized thoracic aorta appears unremarkable. No CT evidence of pulmonary embolism. Top-normal cardiac size. Mechanical mitral valve. Small pockets of air noted within the right atrium, likely iatrogenic from IV injection. There is retrograde flow of contrast from the right atrium into the IVC compatible with a degree of right cardiac dysfunction. No significant pericardial effusion. Small amount of fluid along the left aortic arch in the upper mediastinum may represent complex fluid or small amount of blood/serosanguineous fluid. Small pockets of air noted within this fluid in the upper mediastinum compatible with small pneumomediastinum. An enteric tube is partially visualized extending into the upper abdomen with tip not included in the CT. There is no axillary adenopathy. There is diffuse subcutaneous soft tissue edema and anasarca. Mild degenerative changes of the spine. Median sternotomy wires age indeterminate fracture of the left first rib, new from prior study possibly acute or subacute. No other acute fracture identified. Review of the MIP images confirms the above findings. IMPRESSION: No CT evidence of pulmonary embolism. Moderate bilateral pleural effusions with complete consolidative changes of the lower lobes and partial consolidative changes of the left upper lobe. Clinical correlation is recommended. Endotracheal tube the tip at the origin of the right mainstem bronchus. Recommend retraction and repositioning by approximately 3-4 cm. Small high attenuating fluid along the  left aspect of the aortic arch may represent complex fluid or small amount of blood. These results were called by telephone at the time of interpretation on 08/05/2015 at 10:59 pm to Dr. Marijean Niemann ,  who verbally acknowledged these results. Electronically Signed   By: Elgie Collard M.D.   On: 08/05/2015 23:04   Dg Chest Port 1 View 08/05/2015  CLINICAL DATA:  Central line placement.  Initial encounter. EXAM: PORTABLE CHEST 1 VIEW COMPARISON:  Chest radiograph performed earlier today at 7:10 p.m., and CTA of the chest performed earlier today at 10:15 p.m. FINDINGS: The left IJ line is noted ending about the distal SVC. The patient's endotracheal tube is noted ending 2-3 cm above the carina. An enteric tube is noted extending below the diaphragm. Vascular congestion is again noted. Note is made of small right and small to moderate left pleural effusions, and likely underlying pulmonary edema. No pneumothorax is seen. The cardiomediastinal silhouette is mildly enlarged. The patient is status post median sternotomy. A mitral valve replacement is noted. No acute osseous abnormalities are identified. IMPRESSION: 1. Left IJ line noted ending about the distal SVC. 2. Endotracheal tube noted ending 2-3 cm above the carina. 3. Vascular congestion and mild cardiomegaly. Small right and small to moderate left pleural effusions, with likely underlying pulmonary edema. Electronically Signed   By: Roanna Raider M.D.   On: 08/05/2015 23:50   Dg Chest Portable 1 View 08/05/2015  CLINICAL DATA:  Status post CPR. Endotracheal tube placement. Initial encounter. EXAM: PORTABLE CHEST 1 VIEW COMPARISON:  Chest radiograph performed 07/31/2015 FINDINGS: The patient's endotracheal tube is noted ending overlying the right mainstem bronchus, 1 cm below the carina. This should be retracted 4 cm. Diffuse bilateral airspace opacification is noted, with a small to moderate left-sided pleural effusion. This is concerning for pulmonary  edema, though pneumonia cannot be excluded. No pneumothorax is seen. The cardiomediastinal silhouette is mildly enlarged. The patient is status post median sternotomy. External pacing pads are noted. No acute osseous abnormalities are seen. IMPRESSION: 1. Endotracheal tube noted ending overlying the right mainstem bronchus, 1 cm below the carina. This should be retracted 4 cm. 2. Diffuse bilateral airspace opacification, with a small to moderate left-sided pleural effusion. This is concerning for pulmonary edema, though pneumonia cannot be excluded. 3. Mild cardiomegaly. These results were called by telephone at the time of interpretation on 08/05/2015 at 9:20 pm to Nursing at the [facility] ER, who verbally acknowledged these results. The endotracheal tube had already been repositioned. Electronically Signed   By: Roanna Raider M.D.   On: 08/05/2015 21:50   Dg Chest Port 1 View   EKG:  Uncertain rhythm, possibly SR, junctional beats, APCs, prolonged QT 08/05/15: AFib, IVCD 07/27/15: SR, prolonged QT  TELEMETRY: SR in the 80's currently, SB in the 30's overnight, nonsustained wide complex beats  08/05/15: Echocardiogram Study Conclusions - Left ventricle: The cavity size was normal. Wall thickness was increased in a pattern of mild LVH. Systolic function was normal. The estimated ejection fraction was in the range of 50% to 55%. Wall motion was normal; there were no regional wall motion abnormalities. - Aortic valve: There was trivial regurgitation. - Mitral valve: Prior procedures included surgical repair. The findings are consistent with mild stenosis. - Left atrium: The atrium was mildly dilated. - Pericardium, extracardiac: A small pericardial effusion was identified. There was a left pleural effusion.  Impressions: - Normal LV systolic function; mild LVH; mild LAE; trace AI; s/p MV repair with mild MS; no MR; small pericardial effusion; left pleural effusion.  07/08/15:  R/L heart cath Conclusion     Severe mitral regurgitation with large V waves and 4+ MR  There is mild  to moderate left ventricular systolic dysfunction. With cardiac output/index moderate-severely reduced.  Angiographically normal coronary arteries  Angiographic and Right Heart Cath confirmed severe MR with normal PA pressures. Consistently reduced EF by LV gram as compared to echocardiogram. Cardiac output also confirms at least moderately reduced function   Assessment and Plan:  1. Unwitnessed cardiac arrest     CPR given in the field, records indicate VT, PEA     Hypothermia protocol in progress, initiated 08/04/14, rewarming to start this evening     Off amiodarone with bradycardfia overnight     On norepinephrine     Hypokalemia being addressed/replaced 08/01/15: mitral valve repair with ligation of patent ductus arteriosus and clipping of the left atrial appendage  2. Aspiration Pneumonia  Continue supportive care with primary team  3. QT prolongation  4.  Cardiomyopathy--valve appeared secondary  5. Syncope      Hx of syncope, EKGs with prolonged QT Pending recovery for final POC from an EP stanpoint    Signed, Francis Dowse, PA-C 08/06/2015 12:48 PM  Examined NO hx available  Reviewed all ECGs preoperatively and all have QTc about 490 raising spectre of LQTS  It is also possible taht it is secondary to cardiomyopathy  Have spoken with sister and daughter  We will need to exclude LQTS by genetic evaluation regardless of her recovery  We will need to find the EMS records as there is discordant information as to what was originannlhy found  I have told her sister (naturpothic MD) that i suspect a primary arrhythmic event in the context of antecedent syncope

## 2015-08-06 NOTE — Progress Notes (Signed)
DAILY PROGRESS NOTE  Subjective:  Consultation note and subsequent events reviewed. PEA arrest initially, but thought that inciting event could be arrhythmia.  Echo looks suprisingly good with EF 50-55%, intact mitral valve repair, small pericardial effusion without tamponade and left pleural effusion. She is on the hypothermia protocol at present.  Objective:  Temp:  [89.4 F (31.9 C)-96.6 F (35.9 C)] 91.4 F (33 C) (01/18 1147) Pulse Rate:  [39-110] 83 (01/18 1148) Resp:  [14-20] 18 (01/18 1148) BP: (77-154)/(52-81) 111/70 mmHg (01/18 1148) SpO2:  [90 %-100 %] 100 % (01/18 1148) Arterial Line BP: (111-177)/(28-74) 148/74 mmHg (01/18 0900) FiO2 (%):  [50 %-100 %] 50 % (01/18 1148) Weight:  [130 lb 11.7 oz (59.3 kg)] 130 lb 11.7 oz (59.3 kg) (01/17 2300) Weight change:   Intake/Output from previous day: 01/17 0701 - 01/18 0700 In: 1152.8 [I.V.:532.8; NG/GT:60; IV Piggyback:560] Out: 1950 [Urine:1950]  Intake/Output from this shift: Total I/O In: -  Out: 325 [Urine:325]  Medications: Current Facility-Administered Medications  Medication Dose Route Frequency Provider Last Rate Last Dose  . 0.9 %  sodium chloride infusion  2,000 mL Intravenous Once Jones Bales, MD   2,000 mL at 08/05/15 2245  . amiodarone (NEXTERONE) 1.8 mg/mL load via infusion 150 mg  150 mg Intravenous Once Herminio Commons, MD   150 mg at 08/05/15 2100  . ampicillin-sulbactam (UNASYN) 1.5 g in sodium chloride 0.9 % 50 mL IVPB  1.5 g Intravenous Q6H Thuy D Dang, RPH   1.5 g at 08/06/15 1107  . antiseptic oral rinse solution (CORINZ)  7 mL Mouth Rinse QID Juanito Doom, MD   7 mL at 08/06/15 0400  . artificial tears (LACRILUBE) ophthalmic ointment 1 application  1 application Both Eyes 3 times per day Jones Bales, MD   1 application at 41/74/08 0610  . chlorhexidine gluconate (PERIDEX) 0.12 % solution 15 mL  15 mL Mouth Rinse BID Juanito Doom, MD   15 mL at 08/06/15 0800  .  cisatracurium (NIMBEX) 200 mg in sodium chloride 0.9 % 200 mL (1 mg/mL) infusion  1-1.5 mcg/kg/min Intravenous Continuous Jones Bales, MD 3.5 mL/hr at 08/06/15 0700 1 mcg/kg/min at 08/06/15 0700   And  . cisatracurium (NIMBEX) bolus via infusion 2.9 mg  0.05 mg/kg Intravenous PRN Jones Bales, MD      . fentaNYL (SUBLIMAZE) 2,500 mcg in sodium chloride 0.9 % 250 mL (10 mcg/mL) infusion  25-400 mcg/hr Intravenous Continuous Jones Bales, MD 30 mL/hr at 08/06/15 1030 300 mcg/hr at 08/06/15 1030  . fentaNYL (SUBLIMAZE) bolus via infusion 50 mcg  50 mcg Intravenous Q30 min PRN Jones Bales, MD      . insulin aspart (novoLOG) injection 0-9 Units  0-9 Units Subcutaneous 6 times per day Raylene Miyamoto, MD   3 Units at 08/06/15 0400  . midazolam (VERSED) 50 mg in sodium chloride 0.9 % 50 mL (1 mg/mL) infusion  1-10 mg/hr Intravenous Continuous Jones Bales, MD 5 mL/hr at 08/06/15 1200 5 mg/hr at 08/06/15 1200  . midazolam (VERSED) bolus via infusion 2 mg  2 mg Intravenous Q30 min PRN Jones Bales, MD      . norepinephrine (LEVOPHED) 16 mg in dextrose 5 % 250 mL (0.064 mg/mL) infusion  0-50 mcg/min Intravenous Titrated Juanito Doom, MD 3.6 mL/hr at 08/06/15 1138 3.8 mcg/min at 08/06/15 1138  . pantoprazole (PROTONIX) injection 40 mg  40 mg Intravenous QHS Jones Bales,  MD   40 mg at 08/06/15 0142  . sodium chloride 0.9 % injection 10-40 mL  10-40 mL Intracatheter Q12H Lupita Leash, MD      . sodium chloride 0.9 % injection 10-40 mL  10-40 mL Intracatheter PRN Lupita Leash, MD        Physical Exam: General appearance: sedated, paralyzed and intubated on vent on hypothermia protocol Neck: no carotid bruit and no JVD Lungs: diminished breath sounds bilaterally Heart: regular rate and rhythm Abdomen: soft, non-tender; bowel sounds normal; no masses,  no organomegaly Extremities: extremities normal, atraumatic, no cyanosis or edema Pulses: 2+ and  symmetric Skin: pale, cool, dry Neurologic: Mental status: Intubated, sedated and paralyzed on vent .  Lab Results: Results for orders placed or performed during the hospital encounter of 08/05/15 (from the past 48 hour(s))  Comprehensive metabolic panel     Status: Abnormal   Collection Time: 08/05/15  8:06 PM  Result Value Ref Range   Sodium 127 (L) 135 - 145 mmol/L   Potassium 3.4 (L) 3.5 - 5.1 mmol/L   Chloride 92 (L) 101 - 111 mmol/L   CO2 22 22 - 32 mmol/L   Glucose, Bld 211 (H) 65 - 99 mg/dL   BUN 15 6 - 20 mg/dL   Creatinine, Ser 3.84 0.44 - 1.00 mg/dL   Calcium 7.7 (L) 8.9 - 10.3 mg/dL   Total Protein 5.1 (L) 6.5 - 8.1 g/dL   Albumin 2.6 (L) 3.5 - 5.0 g/dL   AST 38 15 - 41 U/L   ALT 33 14 - 54 U/L   Alkaline Phosphatase 76 38 - 126 U/L   Total Bilirubin 0.8 0.3 - 1.2 mg/dL   GFR calc non Af Amer >60 >60 mL/min   GFR calc Af Amer >60 >60 mL/min    Comment: (NOTE) The eGFR has been calculated using the CKD EPI equation. This calculation has not been validated in all clinical situations. eGFR's persistently <60 mL/min signify possible Chronic Kidney Disease.    Anion gap 13 5 - 15  CBC with Differential     Status: Abnormal   Collection Time: 08/05/15  8:06 PM  Result Value Ref Range   WBC 33.9 (H) 4.0 - 10.5 K/uL   RBC 2.95 (L) 3.87 - 5.11 MIL/uL   Hemoglobin 9.2 (L) 12.0 - 15.0 g/dL   HCT 66.5 (L) 99.3 - 57.0 %   MCV 97.6 78.0 - 100.0 fL   MCH 31.2 26.0 - 34.0 pg   MCHC 31.9 30.0 - 36.0 g/dL   RDW 17.7 93.9 - 03.0 %   Platelets 354 150 - 400 K/uL   Neutrophils Relative % 85 %   Lymphocytes Relative 10 %   Monocytes Relative 5 %   Eosinophils Relative 0 %   Basophils Relative 0 %   Neutro Abs 28.8 (H) 1.7 - 7.7 K/uL   Lymphs Abs 3.4 0.7 - 4.0 K/uL   Monocytes Absolute 1.7 (H) 0.1 - 1.0 K/uL   Eosinophils Absolute 0.0 0.0 - 0.7 K/uL   Basophils Absolute 0.0 0.0 - 0.1 K/uL   WBC Morphology MILD LEFT SHIFT (1-5% METAS, OCC MYELO, OCC BANDS)   I-stat  troponin, ED     Status: Abnormal   Collection Time: 08/05/15  8:06 PM  Result Value Ref Range   Troponin i, poc 0.35 (HH) 0.00 - 0.08 ng/mL   Comment NOTIFIED PHYSICIAN    Comment 3            Comment:  Due to the release kinetics of cTnI, a negative result within the first hours of the onset of symptoms does not rule out myocardial infarction with certainty. If myocardial infarction is still suspected, repeat the test at appropriate intervals.   Lactic acid, plasma     Status: Abnormal   Collection Time: 08/05/15  8:07 PM  Result Value Ref Range   Lactic Acid, Venous 5.7 (HH) 0.5 - 2.0 mmol/L    Comment: CRITICAL RESULT CALLED TO, READ BACK BY AND VERIFIED WITH: J SERRAINOLO,RN 2116 08/05/2015 WBOND   I-stat Chem 8, ED     Status: Abnormal   Collection Time: 08/05/15  8:08 PM  Result Value Ref Range   Sodium 129 (L) 135 - 145 mmol/L   Potassium 3.3 (L) 3.5 - 5.1 mmol/L   Chloride 91 (L) 101 - 111 mmol/L   BUN 18 6 - 20 mg/dL   Creatinine, Ser 0.60 0.44 - 1.00 mg/dL   Glucose, Bld 202 (H) 65 - 99 mg/dL   Calcium, Ion 1.03 (L) 1.12 - 1.23 mmol/L   TCO2 23 0 - 100 mmol/L   Hemoglobin 11.2 (L) 12.0 - 15.0 g/dL   HCT 33.0 (L) 36.0 - 46.0 %  Osmolality     Status: Abnormal   Collection Time: 08/05/15  9:16 PM  Result Value Ref Range   Osmolality 272 (L) 275 - 295 mOsm/kg  I-Stat Troponin, ED (not at [facility])     Status: Abnormal   Collection Time: 08/05/15  9:32 PM  Result Value Ref Range   Troponin i, poc 0.42 (HH) 0.00 - 0.08 ng/mL   Comment NOTIFIED PHYSICIAN    Comment 3            Comment: Due to the release kinetics of cTnI, a negative result within the first hours of the onset of symptoms does not rule out myocardial infarction with certainty. If myocardial infarction is still suspected, repeat the test at appropriate intervals.   Lactic acid, plasma     Status: Abnormal   Collection Time: 08/05/15  9:35 PM  Result Value Ref Range   Lactic Acid, Venous 5.4 (HH) 0.5  - 2.0 mmol/L    Comment: CRITICAL RESULT CALLED TO, READ BACK BY AND VERIFIED WITH: SNOW,N RN 08/05/2015 2246 JORDANS   Magnesium     Status: None   Collection Time: 08/05/15  9:35 PM  Result Value Ref Range   Magnesium 2.3 1.7 - 2.4 mg/dL  TSH     Status: None   Collection Time: 08/05/15  9:35 PM  Result Value Ref Range   TSH 2.455 0.350 - 4.500 uIU/mL  Troponin I     Status: Abnormal   Collection Time: 08/05/15  9:35 PM  Result Value Ref Range   Troponin I 0.29 (H) <0.031 ng/mL    Comment:        PERSISTENTLY INCREASED TROPONIN VALUES IN THE RANGE OF 0.04-0.49 ng/mL CAN BE SEEN IN:       -UNSTABLE ANGINA       -CONGESTIVE HEART FAILURE       -MYOCARDITIS       -CHEST TRAUMA       -ARRYHTHMIAS       -LATE PRESENTING MYOCARDIAL INFARCTION       -COPD   CLINICAL FOLLOW-UP RECOMMENDED.   Protime-INR     Status: Abnormal   Collection Time: 08/05/15  9:35 PM  Result Value Ref Range   Prothrombin Time 26.8 (H) 11.6 - 15.2 seconds  INR 2.51 (H) 0.00 - 1.49  I-Stat arterial blood gas, ED     Status: Abnormal   Collection Time: 08/05/15  9:48 PM  Result Value Ref Range   pH, Arterial 7.445 7.350 - 7.450   pCO2 arterial 28.7 (L) 35.0 - 45.0 mmHg   pO2, Arterial 66.0 (L) 80.0 - 100.0 mmHg   Bicarbonate 20.3 20.0 - 24.0 mEq/L   TCO2 21 0 - 100 mmol/L   O2 Saturation 96.0 %   Acid-base deficit 4.0 (H) 0.0 - 2.0 mmol/L   Patient temperature 33.7 C    Collection site RADIAL, ALLEN'S TEST ACCEPTABLE    Drawn by RT    Sample type ARTERIAL   MRSA PCR Screening     Status: None   Collection Time: 08/05/15 10:35 PM  Result Value Ref Range   MRSA by PCR NEGATIVE NEGATIVE    Comment:        The GeneXpert MRSA Assay (FDA approved for NASAL specimens only), is one component of a comprehensive MRSA colonization surveillance program. It is not intended to diagnose MRSA infection nor to guide or monitor treatment for MRSA infections.   I-STAT 3, arterial blood gas (G3+)      Status: Abnormal   Collection Time: 08/05/15 11:40 PM  Result Value Ref Range   pH, Arterial 7.407 7.350 - 7.450   pCO2 arterial 33.0 (L) 35.0 - 45.0 mmHg   pO2, Arterial 56.0 (L) 80.0 - 100.0 mmHg   Bicarbonate 22.0 20.0 - 24.0 mEq/L   TCO2 23 0 - 100 mmol/L   O2 Saturation 95.0 %   Acid-base deficit 4.0 (H) 0.0 - 2.0 mmol/L   Patient temperature 89.42 F    Sample type ARTERIAL   Troponin I     Status: Abnormal   Collection Time: 08/05/15 11:51 PM  Result Value Ref Range   Troponin I 0.31 (H) <0.031 ng/mL    Comment:        PERSISTENTLY INCREASED TROPONIN VALUES IN THE RANGE OF 0.04-0.49 ng/mL CAN BE SEEN IN:       -UNSTABLE ANGINA       -CONGESTIVE HEART FAILURE       -MYOCARDITIS       -CHEST TRAUMA       -ARRYHTHMIAS       -LATE PRESENTING MYOCARDIAL INFARCTION       -COPD   CLINICAL FOLLOW-UP RECOMMENDED.   Basic metabolic panel     Status: Abnormal   Collection Time: 08/05/15 11:51 PM  Result Value Ref Range   Sodium 126 (L) 135 - 145 mmol/L   Potassium 3.0 (L) 3.5 - 5.1 mmol/L   Chloride 90 (L) 101 - 111 mmol/L   CO2 22 22 - 32 mmol/L   Glucose, Bld 283 (H) 65 - 99 mg/dL   BUN 14 6 - 20 mg/dL   Creatinine, Ser 0.65 0.44 - 1.00 mg/dL   Calcium 7.9 (L) 8.9 - 10.3 mg/dL   GFR calc non Af Amer >60 >60 mL/min   GFR calc Af Amer >60 >60 mL/min    Comment: (NOTE) The eGFR has been calculated using the CKD EPI equation. This calculation has not been validated in all clinical situations. eGFR's persistently <60 mL/min signify possible Chronic Kidney Disease.    Anion gap 14 5 - 15  Protime-INR now and repeat in 8 hours     Status: Abnormal   Collection Time: 08/05/15 11:51 PM  Result Value Ref Range   Prothrombin Time 28.4 (H) 11.6 -  15.2 seconds   INR 2.71 (H) 0.00 - 1.49  APTT now and repeat in 8 hours     Status: Abnormal   Collection Time: 08/05/15 11:51 PM  Result Value Ref Range   aPTT 56 (H) 24 - 37 seconds    Comment:        IF BASELINE aPTT IS  ELEVATED, SUGGEST PATIENT RISK ASSESSMENT BE USED TO DETERMINE APPROPRIATE ANTICOAGULANT THERAPY.   Glucose, capillary     Status: Abnormal   Collection Time: 08/06/15 12:07 AM  Result Value Ref Range   Glucose-Capillary 240 (H) 65 - 99 mg/dL   Comment 1 Capillary Specimen   Sodium, urine, random     Status: None   Collection Time: 08/06/15  1:19 AM  Result Value Ref Range   Sodium, Ur 96 mmol/L  Osmolality, urine     Status: None   Collection Time: 08/06/15  1:19 AM  Result Value Ref Range   Osmolality, Ur 452 300 - 900 mOsm/kg  Glucose, capillary     Status: Abnormal   Collection Time: 08/06/15  1:48 AM  Result Value Ref Range   Glucose-Capillary 278 (H) 65 - 99 mg/dL   Comment 1 Arterial Specimen   Troponin I     Status: Abnormal   Collection Time: 08/06/15  2:00 AM  Result Value Ref Range   Troponin I 0.52 (HH) <0.031 ng/mL    Comment:        POSSIBLE MYOCARDIAL ISCHEMIA. SERIAL TESTING RECOMMENDED. CRITICAL RESULT CALLED TO, READ BACK BY AND VERIFIED WITH: SNOW,N RN 08/06/2015 0437 JORDANS AGE OF SPECIMEN MAY AFFECT INTEGRITY OF RESULTS   Basic metabolic panel     Status: Abnormal   Collection Time: 08/06/15  2:00 AM  Result Value Ref Range   Sodium 127 (L) 135 - 145 mmol/L   Potassium 3.5 3.5 - 5.1 mmol/L   Chloride 92 (L) 101 - 111 mmol/L   CO2 22 22 - 32 mmol/L   Glucose, Bld 282 (H) 65 - 99 mg/dL   BUN 15 6 - 20 mg/dL   Creatinine, Ser 0.73 0.44 - 1.00 mg/dL   Calcium 8.1 (L) 8.9 - 10.3 mg/dL   GFR calc non Af Amer >60 >60 mL/min   GFR calc Af Amer >60 >60 mL/min    Comment: (NOTE) The eGFR has been calculated using the CKD EPI equation. This calculation has not been validated in all clinical situations. eGFR's persistently <60 mL/min signify possible Chronic Kidney Disease.    Anion gap 13 5 - 15  Glucose, capillary     Status: Abnormal   Collection Time: 08/06/15  3:31 AM  Result Value Ref Range   Glucose-Capillary 157 (H) 65 - 99 mg/dL   Comment  1 Arterial Specimen   Blood gas, arterial     Status: Abnormal   Collection Time: 08/06/15  3:45 AM  Result Value Ref Range   FIO2 1.00    Delivery systems VENTILATOR    Mode PRESSURE REGULATED VOLUME CONTROL    VT 400 mL   LHR 18 resp/min   Peep/cpap 10.0 cm H20   pH, Arterial 7.496 (H) 7.350 - 7.450   pCO2 arterial 27.8 (L) 35.0 - 45.0 mmHg   pO2, Arterial 234 (H) 80.0 - 100.0 mmHg   Bicarbonate 22.7 20.0 - 24.0 mEq/L   TCO2 23.7 0 - 100 mmol/L   Acid-base deficit 1.2 0.0 - 2.0 mmol/L   O2 Saturation 99.8 %   Patient temperature 89.6  Collection site A-LINE    Drawn by [phone]    Sample type ARTERIAL    Allens test (pass/fail) NOT INDICATED (A) PASS  Glucose, capillary     Status: Abnormal   Collection Time: 08/06/15  3:57 AM  Result Value Ref Range   Glucose-Capillary 218 (H) 65 - 99 mg/dL   Comment 1 Capillary Specimen   Troponin I     Status: Abnormal   Collection Time: 08/06/15  4:00 AM  Result Value Ref Range   Troponin I 0.58 (HH) <0.031 ng/mL    Comment:        POSSIBLE MYOCARDIAL ISCHEMIA. SERIAL TESTING RECOMMENDED. CRITICAL VALUE NOTED.  VALUE IS CONSISTENT WITH PREVIOUSLY REPORTED AND CALLED VALUE.   Basic metabolic panel     Status: Abnormal   Collection Time: 08/06/15  4:00 AM  Result Value Ref Range   Sodium 130 (L) 135 - 145 mmol/L   Potassium 4.3 3.5 - 5.1 mmol/L    Comment: DELTA CHECK NOTED   Chloride 95 (L) 101 - 111 mmol/L   CO2 24 22 - 32 mmol/L   Glucose, Bld 239 (H) 65 - 99 mg/dL   BUN 13 6 - 20 mg/dL   Creatinine, Ser 0.55 0.44 - 1.00 mg/dL   Calcium 8.3 (L) 8.9 - 10.3 mg/dL   GFR calc non Af Amer >60 >60 mL/min   GFR calc Af Amer >60 >60 mL/min    Comment: (NOTE) The eGFR has been calculated using the CKD EPI equation. This calculation has not been validated in all clinical situations. eGFR's persistently <60 mL/min signify possible Chronic Kidney Disease.    Anion gap 11 5 - 15  Magnesium     Status: None   Collection Time:  08/06/15  4:00 AM  Result Value Ref Range   Magnesium 1.9 1.7 - 2.4 mg/dL  Phosphorus     Status: Abnormal   Collection Time: 08/06/15  4:00 AM  Result Value Ref Range   Phosphorus 1.4 (L) 2.5 - 4.6 mg/dL  CBC     Status: Abnormal   Collection Time: 08/06/15  4:00 AM  Result Value Ref Range   WBC 39.1 (H) 4.0 - 10.5 K/uL   RBC 3.43 (L) 3.87 - 5.11 MIL/uL   Hemoglobin 11.1 (L) 12.0 - 15.0 g/dL   HCT 31.7 (L) 36.0 - 46.0 %   MCV 92.4 78.0 - 100.0 fL   MCH 32.4 26.0 - 34.0 pg   MCHC 35.0 30.0 - 36.0 g/dL   RDW 14.2 11.5 - 15.5 %   Platelets 394 150 - 400 K/uL  Glucose, capillary     Status: Abnormal   Collection Time: 08/06/15  5:12 AM  Result Value Ref Range   Glucose-Capillary 179 (H) 65 - 99 mg/dL   Comment 1 Arterial Specimen   Basic metabolic panel     Status: Abnormal   Collection Time: 08/06/15  6:34 AM  Result Value Ref Range   Sodium 129 (L) 135 - 145 mmol/L   Potassium 4.2 3.5 - 5.1 mmol/L   Chloride 99 (L) 101 - 111 mmol/L   CO2 23 22 - 32 mmol/L   Glucose, Bld 163 (H) 65 - 99 mg/dL   BUN 11 6 - 20 mg/dL   Creatinine, Ser 0.52 0.44 - 1.00 mg/dL   Calcium 8.1 (L) 8.9 - 10.3 mg/dL   GFR calc non Af Amer >60 >60 mL/min   GFR calc Af Amer >60 >60 mL/min    Comment: (NOTE) The eGFR  has been calculated using the CKD EPI equation. This calculation has not been validated in all clinical situations. eGFR's persistently <60 mL/min signify possible Chronic Kidney Disease.    Anion gap 7 5 - 15  Protime-INR now and repeat in 8 hours     Status: Abnormal   Collection Time: 08/06/15  6:34 AM  Result Value Ref Range   Prothrombin Time 27.0 (H) 11.6 - 15.2 seconds   INR 2.54 (H) 0.00 - 1.49  APTT now and repeat in 8 hours     Status: Abnormal   Collection Time: 08/06/15  6:34 AM  Result Value Ref Range   aPTT 38 (H) 24 - 37 seconds    Comment:        IF BASELINE aPTT IS ELEVATED, SUGGEST PATIENT RISK ASSESSMENT BE USED TO DETERMINE APPROPRIATE ANTICOAGULANT THERAPY.    Glucose, capillary     Status: Abnormal   Collection Time: 08/06/15  6:55 AM  Result Value Ref Range   Glucose-Capillary 143 (H) 65 - 99 mg/dL   Comment 1 Arterial Specimen   Glucose, capillary     Status: Abnormal   Collection Time: 08/06/15  8:27 AM  Result Value Ref Range   Glucose-Capillary 119 (H) 65 - 99 mg/dL   Comment 1 Notify RN   Troponin I     Status: Abnormal   Collection Time: 08/06/15  8:45 AM  Result Value Ref Range   Troponin I 0.65 (HH) <0.031 ng/mL    Comment:        POSSIBLE MYOCARDIAL ISCHEMIA. SERIAL TESTING RECOMMENDED. CRITICAL VALUE NOTED.  VALUE IS CONSISTENT WITH PREVIOUSLY REPORTED AND CALLED VALUE.   Culture, blood (routine x 2)     Status: None (Preliminary result)   Collection Time: 08/06/15  8:55 AM  Result Value Ref Range   Specimen Description BLOOD RIGHT HAND    Special Requests IN PEDIATRIC BOTTLE 2CC    Culture PENDING    Report Status PENDING   I-STAT, chem 8     Status: Abnormal   Collection Time: 08/06/15 10:09 AM  Result Value Ref Range   Sodium 133 (L) 135 - 145 mmol/L   Potassium 3.5 3.5 - 5.1 mmol/L   Chloride 96 (L) 101 - 111 mmol/L   BUN 10 6 - 20 mg/dL   Creatinine, Ser 0.30 (L) 0.44 - 1.00 mg/dL   Glucose, Bld 127 (H) 65 - 99 mg/dL   Calcium, Ion 1.12 1.12 - 1.23 mmol/L   TCO2 25 0 - 100 mmol/L   Hemoglobin 11.9 (L) 12.0 - 15.0 g/dL   HCT 35.0 (L) 36.0 - 46.0 %  Glucose, capillary     Status: Abnormal   Collection Time: 08/06/15 12:16 PM  Result Value Ref Range   Glucose-Capillary 108 (H) 65 - 99 mg/dL    Imaging: Ct Angio Chest Pe W/cm &/or Wo Cm  08/05/2015  CLINICAL DATA:  55 year old female with shortness of breath. Status post CPR. EXAM: CT ANGIOGRAPHY CHEST WITH CONTRAST TECHNIQUE: Multidetector CT imaging of the chest was performed using the standard protocol during bolus administration of intravenous contrast. Multiplanar CT image reconstructions and MIPs were obtained to evaluate the vascular anatomy. CONTRAST:   72m OMNIPAQUE IOHEXOL 350 MG/ML SOLN COMPARISON:  Radiograph dated 08/05/2015 FINDINGS: An endotracheal tube is noted with tip at the carina tilting towards the right mainstem bronchus. Recommend retraction and repositioning by approximately 3- 4 cm. Moderate bilateral pleural effusions. There is complete consolidation of the lower lobes bilaterally with air bronchograms.  There is partial consolidative changes of the left upper lobe and lingula. There is ground-glass opacity within the lingula. Findings may represent pneumonia, or pulmonary edema or ARDS. Clinical correlation is recommended. There is no pneumothorax. The visualized thoracic aorta appears unremarkable. No CT evidence of pulmonary embolism. Top-normal cardiac size. Mechanical mitral valve. Small pockets of air noted within the right atrium, likely iatrogenic from IV injection. There is retrograde flow of contrast from the right atrium into the IVC compatible with a degree of right cardiac dysfunction. No significant pericardial effusion. Small amount of fluid along the left aortic arch in the upper mediastinum may represent complex fluid or small amount of blood/serosanguineous fluid. Small pockets of air noted within this fluid in the upper mediastinum compatible with small pneumomediastinum. An enteric tube is partially visualized extending into the upper abdomen with tip not included in the CT. There is no axillary adenopathy. There is diffuse subcutaneous soft tissue edema and anasarca. Mild degenerative changes of the spine. Median sternotomy wires age indeterminate fracture of the left first rib, new from prior study possibly acute or subacute. No other acute fracture identified. Review of the MIP images confirms the above findings. IMPRESSION: No CT evidence of pulmonary embolism. Moderate bilateral pleural effusions with complete consolidative changes of the lower lobes and partial consolidative changes of the left upper lobe. Clinical  correlation is recommended. Endotracheal tube the tip at the origin of the right mainstem bronchus. Recommend retraction and repositioning by approximately 3-4 cm. Small high attenuating fluid along the left aspect of the aortic arch may represent complex fluid or small amount of blood. These results were called by telephone at the time of interpretation on 08/05/2015 at 10:59 pm to Dr. Heriberto Antigua , who verbally acknowledged these results. Electronically Signed   By: Anner Crete M.D.   On: 08/05/2015 23:04   Dg Chest Port 1 View  08/05/2015  CLINICAL DATA:  Central line placement.  Initial encounter. EXAM: PORTABLE CHEST 1 VIEW COMPARISON:  Chest radiograph performed earlier today at 7:10 p.m., and CTA of the chest performed earlier today at 10:15 p.m. FINDINGS: The left IJ line is noted ending about the distal SVC. The patient's endotracheal tube is noted ending 2-3 cm above the carina. An enteric tube is noted extending below the diaphragm. Vascular congestion is again noted. Note is made of small right and small to moderate left pleural effusions, and likely underlying pulmonary edema. No pneumothorax is seen. The cardiomediastinal silhouette is mildly enlarged. The patient is status post median sternotomy. A mitral valve replacement is noted. No acute osseous abnormalities are identified. IMPRESSION: 1. Left IJ line noted ending about the distal SVC. 2. Endotracheal tube noted ending 2-3 cm above the carina. 3. Vascular congestion and mild cardiomegaly. Small right and small to moderate left pleural effusions, with likely underlying pulmonary edema. Electronically Signed   By: Garald Balding M.D.   On: 08/05/2015 23:50   Dg Chest Portable 1 View  08/05/2015  CLINICAL DATA:  Status post CPR. Endotracheal tube placement. Initial encounter. EXAM: PORTABLE CHEST 1 VIEW COMPARISON:  Chest radiograph performed 07/31/2015 FINDINGS: The patient's endotracheal tube is noted ending overlying the right  mainstem bronchus, 1 cm below the carina. This should be retracted 4 cm. Diffuse bilateral airspace opacification is noted, with a small to moderate left-sided pleural effusion. This is concerning for pulmonary edema, though pneumonia cannot be excluded. No pneumothorax is seen. The cardiomediastinal silhouette is mildly enlarged. The patient is status post median  sternotomy. External pacing pads are noted. No acute osseous abnormalities are seen. IMPRESSION: 1. Endotracheal tube noted ending overlying the right mainstem bronchus, 1 cm below the carina. This should be retracted 4 cm. 2. Diffuse bilateral airspace opacification, with a small to moderate left-sided pleural effusion. This is concerning for pulmonary edema, though pneumonia cannot be excluded. 3. Mild cardiomegaly. These results were called by telephone at the time of interpretation on 08/05/2015 at 9:20 pm to Nursing at the [facility] ER, who verbally acknowledged these results. The endotracheal tube had already been repositioned. Electronically Signed   By: Garald Balding M.D.   On: 08/05/2015 21:50    Assessment:  1. Principal Problem: 2.   Cardiac arrest ([place] [place]) 3. Active Problems: 4.   Severe mitral regurgitation 5.   Patent ductus arteriosus 6.   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 7.   Long term (current) use of anticoagulants [Z79.01] 8.   Respiratory arrest ([place]) 9.   Acute respiratory failure with hypoxia (H[facility]) 10.   Plan:  1. Hypothermia protocol. Noted to be bradycardic (expected) overnight, now with intermittent a-fib. Amiodarone was ordered, but discontinued due to hypotension. Blood pressure is labile. INR 2.5 - was on warfarin. EP consult is pending for long-term recommendations regarding EPS, possible need for AICD and arrhythmias.  CRITICAL CARE:  The patient is critically ill with multi-organ system failure and requires high complexity decision making for assessment and support, frequent  evaluation and titration of therapies, application of advanced monitoring technologies and extensive interpretation of multiple databases.  Time Spent Directly with Patient:  35 minutes  Length of Stay:  LOS: 1 day   Pixie Casino, MD, [facility] Attending Cardiologist [place] 08/06/2015, 2:10 PM

## 2015-08-06 NOTE — Progress Notes (Signed)
EKG CRITICAL VALUE     12 lead EKG performed.  Critical value noted.  Johnella Moloney, RN notified.   Oda Cogan, CCT 08/06/2015 7:22 AM

## 2015-08-06 NOTE — Progress Notes (Signed)
EEG Completed; Results Pending  

## 2015-08-06 NOTE — Progress Notes (Signed)
Sputum sample obtained and sent to lab by RT. 

## 2015-08-06 NOTE — Procedures (Signed)
History: [name] s/p cardiac arrest  Sedation: midazolam  Technique: This is a 21 channel routine scalp EEG performed at the bedside with bipolar and monopolar montages arranged in accordance to the international 10/20 system of electrode placement. One channel was dedicated to EKG recording.    Background: The background consists of generalized irregular delta activity with a relative paucity of faster frequencies.   Photic stimulation: Physiologic driving is not performed  EEG Abnormalities: 1) Generalized irregular slow activity 2) Absent PDR  Clinical Interpretation: This EEG is consistent with a generalized non-specific cerebral dysfunction(encephalopathy). Though nonspecific, this can be seen with sedation and/or hypothermia. There was no seizure or seizure predisposition recorded on this study. Please note that a normal EEG does not preclude the possibility of epilepsy.   Ritta Slot, MD [facility] [phone]  If 7pm- 7am, please page neurology on call as listed in AMION.

## 2015-08-07 ENCOUNTER — Inpatient Hospital Stay (HOSPITAL_COMMUNITY): Payer: BLUE CROSS/BLUE SHIELD

## 2015-08-07 LAB — POCT I-STAT, CHEM 8
BUN: 7 mg/dL (ref 6–20)
BUN: 8 mg/dL (ref 6–20)
BUN: 9 mg/dL (ref 6–20)
BUN: 10 mg/dL (ref 6–20)
CALCIUM ION: 1.1 mmol/L — AB (ref 1.12–1.23)
CALCIUM ION: 1.12 mmol/L (ref 1.12–1.23)
CHLORIDE: 97 mmol/L — AB (ref 101–111)
CHLORIDE: 102 mmol/L (ref 101–111)
CHLORIDE: 99 mmol/L — AB (ref 101–111)
Calcium, Ion: 1.09 mmol/L — ABNORMAL LOW (ref 1.12–1.23)
Calcium, Ion: 1.07 mmol/L — ABNORMAL LOW (ref 1.12–1.23)
Chloride: 98 mmol/L — ABNORMAL LOW (ref 101–111)
Creatinine, Ser: 0.3 mg/dL — ABNORMAL LOW (ref 0.44–1.00)
Creatinine, Ser: 0.4 mg/dL — ABNORMAL LOW (ref 0.44–1.00)
Creatinine, Ser: 0.4 mg/dL — ABNORMAL LOW (ref 0.44–1.00)
Creatinine, Ser: 0.5 mg/dL (ref 0.44–1.00)
Glucose, Bld: 94 mg/dL (ref 65–99)
Glucose, Bld: 107 mg/dL — ABNORMAL HIGH (ref 65–99)
Glucose, Bld: 107 mg/dL — ABNORMAL HIGH (ref 65–99)
Glucose, Bld: 110 mg/dL — ABNORMAL HIGH (ref 65–99)
HCT: 39 % (ref 36.0–46.0)
HCT: 40 % (ref 36.0–46.0)
HEMATOCRIT: 37 % (ref 36.0–46.0)
HEMATOCRIT: 37 % (ref 36.0–46.0)
HEMOGLOBIN: 12.6 g/dL (ref 12.0–15.0)
HEMOGLOBIN: 13.6 g/dL (ref 12.0–15.0)
Hemoglobin: 12.6 g/dL (ref 12.0–15.0)
Hemoglobin: 13.3 g/dL (ref 12.0–15.0)
POTASSIUM: 4.1 mmol/L (ref 3.5–5.1)
POTASSIUM: 4.5 mmol/L (ref 3.5–5.1)
Potassium: 3.8 mmol/L (ref 3.5–5.1)
Potassium: 3.7 mmol/L (ref 3.5–5.1)
SODIUM: 133 mmol/L — AB (ref 135–145)
SODIUM: 131 mmol/L — AB (ref 135–145)
SODIUM: 132 mmol/L — AB (ref 135–145)
Sodium: 133 mmol/L — ABNORMAL LOW (ref 135–145)
TCO2: 20 mmol/L (ref 0–100)
TCO2: 19 mmol/L (ref 0–100)
TCO2: 18 mmol/L (ref 0–100)
TCO2: 20 mmol/L (ref 0–100)

## 2015-08-07 LAB — PROTIME-INR
INR: 4.31 — AB (ref 0.00–1.49)
Prothrombin Time: 40.2 seconds — ABNORMAL HIGH (ref 11.6–15.2)

## 2015-08-07 LAB — CBC WITH DIFFERENTIAL/PLATELET
Basophils Absolute: 0 10*3/uL (ref 0.0–0.1)
Basophils Relative: 0 %
Eosinophils Absolute: 0 10*3/uL (ref 0.0–0.7)
Eosinophils Relative: 0 %
HCT: 33.4 % — ABNORMAL LOW (ref 36.0–46.0)
Hemoglobin: 11.6 g/dL — ABNORMAL LOW (ref 12.0–15.0)
Lymphocytes Relative: 8 %
Lymphs Abs: 1.5 10*3/uL (ref 0.7–4.0)
MCH: 32.2 pg (ref 26.0–34.0)
MCHC: 34.7 g/dL (ref 30.0–36.0)
MCV: 92.8 fL (ref 78.0–100.0)
Monocytes Absolute: 1.2 10*3/uL — ABNORMAL HIGH (ref 0.1–1.0)
Monocytes Relative: 6 %
Neutro Abs: 16.9 10*3/uL — ABNORMAL HIGH (ref 1.7–7.7)
Neutrophils Relative %: 86 %
Platelets: 321 10*3/uL (ref 150–400)
RBC: 3.6 MIL/uL — ABNORMAL LOW (ref 3.87–5.11)
RDW: 15.2 % (ref 11.5–15.5)
WBC: 19.7 10*3/uL — ABNORMAL HIGH (ref 4.0–10.5)

## 2015-08-07 LAB — URINE CULTURE: Culture: NO GROWTH

## 2015-08-07 LAB — GLUCOSE, CAPILLARY
GLUCOSE-CAPILLARY: 89 mg/dL (ref 65–99)
GLUCOSE-CAPILLARY: 97 mg/dL (ref 65–99)
GLUCOSE-CAPILLARY: 91 mg/dL (ref 65–99)
GLUCOSE-CAPILLARY: 151 mg/dL — AB (ref 65–99)
Glucose-Capillary: 85 mg/dL (ref 65–99)
Glucose-Capillary: 97 mg/dL (ref 65–99)
Glucose-Capillary: 172 mg/dL — ABNORMAL HIGH (ref 65–99)

## 2015-08-07 LAB — POCT I-STAT 3, ART BLOOD GAS (G3+)
Acid-base deficit: 5 mmol/L — ABNORMAL HIGH (ref 0.0–2.0)
BICARBONATE: 18.4 meq/L — AB (ref 20.0–24.0)
O2 Saturation: 99 %
PO2 ART: 122 mmHg — AB (ref 80.0–100.0)
TCO2: 19 mmol/L (ref 0–100)
pCO2 arterial: 26.6 mmHg — ABNORMAL LOW (ref 35.0–45.0)
pH, Arterial: 7.444 (ref 7.350–7.450)

## 2015-08-07 LAB — BASIC METABOLIC PANEL
Anion gap: 14 (ref 5–15)
BUN: 9 mg/dL (ref 6–20)
CHLORIDE: 100 mmol/L — AB (ref 101–111)
CO2: 19 mmol/L — AB (ref 22–32)
CREATININE: 0.64 mg/dL (ref 0.44–1.00)
Calcium: 8.2 mg/dL — ABNORMAL LOW (ref 8.9–10.3)
GFR calc non Af Amer: >60 mL/min (ref >60)
Glucose, Bld: 106 mg/dL — ABNORMAL HIGH (ref 65–99)
POTASSIUM: 4.2 mmol/L (ref 3.5–5.1)
SODIUM: 133 mmol/L — AB (ref 135–145)

## 2015-08-07 LAB — MAGNESIUM
Magnesium: 2.1 mg/dL (ref 1.7–2.4)
Magnesium: 2.2 mg/dL (ref 1.7–2.4)

## 2015-08-07 LAB — PHOSPHORUS: Phosphorus: 3.6 mg/dL (ref 2.5–4.6)

## 2015-08-07 LAB — HEPATIC FUNCTION PANEL
ALT: 30 U/L (ref 14–54)
AST: 31 U/L (ref 15–41)
Albumin: 2.4 g/dL — ABNORMAL LOW (ref 3.5–5.0)
Alkaline Phosphatase: 97 U/L (ref 38–126)
Bilirubin, Direct: 0.2 mg/dL (ref 0.1–0.5)
Indirect Bilirubin: 1.2 mg/dL — ABNORMAL HIGH (ref 0.3–0.9)
Total Bilirubin: 1.4 mg/dL — ABNORMAL HIGH (ref 0.3–1.2)
Total Protein: 5.3 g/dL — ABNORMAL LOW (ref 6.5–8.1)

## 2015-08-07 MED ORDER — AMIODARONE LOAD VIA INFUSION
150.0000 mg | Freq: Once | INTRAVENOUS | Status: AC
  Administered 2015-08-07: 150 mg via INTRAVENOUS
  Filled 2015-08-07: qty 83.34

## 2015-08-07 MED ORDER — AMIODARONE HCL IN DEXTROSE 360-4.14 MG/200ML-% IV SOLN
60.0000 mg/h | INTRAVENOUS | Status: DC
  Administered 2015-08-07 – 2015-08-08 (×2): 60 mg/h via INTRAVENOUS
  Filled 2015-08-07 (×2): qty 200

## 2015-08-07 MED ORDER — CHLORHEXIDINE GLUCONATE 0.12% ORAL RINSE (MEDLINE KIT)
15.0000 mL | Freq: Two times a day (BID) | OROMUCOSAL | Status: DC
  Administered 2015-08-07 – 2015-08-10 (×7): 15 mL via OROMUCOSAL

## 2015-08-07 MED ORDER — PHENYLEPHRINE HCL 10 MG/ML IJ SOLN
0.0000 ug/min | INTRAVENOUS | Status: DC
  Administered 2015-08-07: 200 ug/min via INTRAVENOUS
  Administered 2015-08-07: 400 ug/min via INTRAVENOUS
  Administered 2015-08-07: 350 ug/min via INTRAVENOUS
  Administered 2015-08-08 (×6): 400 ug/min via INTRAVENOUS
  Filled 2015-08-07 (×10): qty 4

## 2015-08-07 MED ORDER — SODIUM CHLORIDE 0.9 % IV SOLN
INTRAVENOUS | Status: DC | PRN
  Administered 2015-08-07: 500 mL via INTRAVENOUS

## 2015-08-07 MED ORDER — ANTISEPTIC ORAL RINSE SOLUTION (CORINZ)
7.0000 mL | OROMUCOSAL | Status: DC
  Administered 2015-08-07 – 2015-08-10 (×33): 7 mL via OROMUCOSAL

## 2015-08-07 MED ORDER — PHENYLEPHRINE HCL 10 MG/ML IJ SOLN
0.0000 ug/min | INTRAVENOUS | Status: DC
  Administered 2015-08-07: 50 ug/min via INTRAVENOUS
  Filled 2015-08-07: qty 1

## 2015-08-07 MED ORDER — SODIUM CHLORIDE 0.9 % IV SOLN
INTRAVENOUS | Status: DC | PRN
  Administered 2015-08-07 – 2015-08-10 (×2): 500 mL via INTRAVENOUS

## 2015-08-07 MED ORDER — NOREPINEPHRINE BITARTRATE 1 MG/ML IV SOLN
0.0000 ug/min | INTRAVENOUS | Status: DC
  Administered 2015-08-08: 5 ug/min via INTRAVENOUS
  Administered 2015-08-11: 2 ug/min via INTRAVENOUS
  Filled 2015-08-07 (×2): qty 16

## 2015-08-07 MED ORDER — AMIODARONE HCL IN DEXTROSE 360-4.14 MG/200ML-% IV SOLN
30.0000 mg/h | INTRAVENOUS | Status: DC
  Administered 2015-08-08: 30 mg/h via INTRAVENOUS
  Filled 2015-08-07: qty 200

## 2015-08-07 NOTE — Progress Notes (Signed)
PULMONARY / CRITICAL CARE MEDICINE   Name: Diane [name] MRN: [id number] DOB: 1961-04-19    ADMISSION DATE:  08/05/2015   REFERRING MD:  ED  CHIEF COMPLAINT:  Cardiac Arrest   SUBJECTIVE:  Very labile BP overnight, extremely sensitive to titration of Levo. Rewarming.    VITAL SIGNS: BP 94/64 mmHg  Pulse 103  Temp(Src) 94.1 F (34.5 C) (Core (Comment))  Resp 18  Ht  (1.575 m)  Wt 130 lb 11.7 oz (59.3 kg)  BMI 23.91 kg/m2  SpO2 100%  HEMODYNAMICS: CVP:  [4 mmHg-14 mmHg] 13 mmHg  VENTILATOR SETTINGS: Vent Mode:  [-] PRVC FiO2 (%):  [50 %] 50 % Set Rate:  [18 bmp] 18 bmp Vt Set:  [400 mL] 400 mL PEEP:  [8 cmH20-10 cmH20] 8 cmH20 Plateau Pressure:  [22 cmH20-26 cmH20] 23 cmH20  INTAKE / OUTPUT: I/O last 3 completed shifts: In: 2531.1 [I.V.:1561.1; NG/GT:60; IV Piggyback:910] Out: 3675 [Urine:3675]  PHYSICAL EXAMINATION: General:  Intubated, sedated, hypothermic, paralyzed.  Neuro:  RASS -4, pupils 2 mm, sluggish. Sedated.  HEENT:  ETT/OGT in place. Moist mucus membranes.  Cardiovascular:  RRR, no murmurs, gallops, rubs.  Lungs:  Air entry equal bilaterally. Coarse breath sounds.  Abdomen:  Arctic sun pads in place.  Musculoskeletal:  No edema. Faint distal pulses.  Skin:  Pallor.   LABS:  BMET  Recent Labs Lab 08/06/15 0200 08/06/15 0400 08/06/15 0634  08/06/15 2222 08/07/15 0223 08/07/15 0610  NA 127* 130* 129*  < > 133* 133* 133*  K 3.5 4.3 4.2  < > 3.6 3.8 3.7  CL 92* 95* 99*  < > 97* 97* 98*  CO2 --   --   --   --   BUN < > CREATININE 0.73 0.55 0.52  < > 0.30* 0.30* 0.40*  GLUCOSE 282* 239* 163*  < > 94 94 107*  < > = values in this interval not displayed.   Electrolytes  Recent Labs Lab 08/05/15 2135  08/06/15 0200 08/06/15 0400 08/06/15 0634  CALCIUM  --   < > 8.1* 8.3* 8.1*  MG 2.3  --   --  1.9  --   PHOS  --   --   --  1.4*  --   < > = values in this interval not displayed.  CBC  Recent  Labs Lab 08/05/15 2006  08/06/15 0400  08/06/15 2222 08/07/15 0223 08/07/15 0610  WBC 33.9*  --  39.1*  --   --   --   --   HGB 9.2*  < > 11.1*  < > 11.9* 12.6 13.3  HCT 28.8*  < > 31.7*  < > 35.0* 37.0 39.0  PLT 354  --  394  --   --   --   --   < > = values in this interval not displayed.   Coag's  Recent Labs Lab 08/05/15 2135 08/05/15 2351 08/06/15 0634  APTT  --  56* 38*  INR 2.51* 2.71* 2.54*   Sepsis Markers  Recent Labs Lab 08/05/15 2007 08/05/15 2135  LATICACIDVEN 5.7* 5.4*   ABG  Recent Labs Lab 08/05/15 2148 08/05/15 2340 08/06/15 0345  PHART 7.445 7.407 7.496*  PCO2ART 28.7* 33.0* 27.8*  PO2ART 66.0* 56.0* 234*   Liver Enzymes  Recent Labs Lab 08/05/15 2006  AST 38  ALT 33  ALKPHOS 76  BILITOT 0.8  ALBUMIN 2.6*   Cardiac Enzymes  Recent Labs Lab 08/06/15 0200 08/06/15 0400 08/06/15 0845  TROPONINI 0.52* 0.58* 0.65*    Glucose  Recent Labs Lab 08/06/15 0827 08/06/15 1216 08/06/15 1618 08/06/15 2028 08/07/15 0015 08/07/15 0437  GLUCAP 119* 108* 89 84 85 97    Imaging No results found.   STUDIES:  1/17 CXR >>> Diffuse bilateral airspace opacification, aspiration vs edema 1/17 CTA chest >>> No CT evidence of pulmonary embolism. Moderate bilateral pleural effusions with complete consolidative changes of the lower lobes and partial consolidative changes of the left upper lobe. Small high attenuating fluid along the left aspect of the aortic arch may represent complex fluid or small amount of blood. 1/18 EEG >>> Consistent with a generalized non-specific cerebral dysfunction (encephalopathy).  CULTURES: Blood 1/18 >> Urine 1/18 >> Resp 1/18 >>  ANTIBIOTICS: Unasyn 1/17 >>   SIGNIFICANT EVENTS: 1/17 PEA arrest with ROSC, VT arrest with ROSC, intubated, hypothermia protocol  LINES/TUBES: LIJ CVL 1/17 >> L Radial A-line 1/18 >> ETT 1/17 >>  DISCUSSION: 55 y.o. female w/ PMHx of severe MR s/p repair 07/22/15, NICM,  chronic combined CHF, presented to [facility] ED after cardiac arrest.  ASSESSMENT / PLAN:  PULMONARY A: VDRF Aspiration Pneumonia vs Pulmonary Edema Large Bilateral Pleural Effusions L > R P:  Continue full vent support SBT after rewarmed Repeat CXR in AM See ID Pending neurological recovery, will likely need thoracentesis given size of effusions  CARDIOVASCULAR A: S/p Cardiac arrest, PEA and VT (shock x3); thought to be related to primary arrhythmia Severe MR s/p Repair on Coumadin Heart Block Post-op Atrial Fibrillation s/p LAA Clipping Normal Coronaries on Cath Chronic Combined CHF Labile Blood Pressure P:  Cardiology following Hypothermia protocol Hold Amio given bradycardia Levophed gtt for MAP < 85 Hold Coumadin; start Heparin for INR <2 Repeat EKG this AM  RENAL A: Hyponatremia Normal Renal Function P:  Repeat BMP in AM Replace electrolytes as needed Check Mag, Phos  GASTROINTESTINAL A: GI prophylaxis Nutrition P:  Protonix NPO while hypothermia Start tube feeds once rewarmed  HEMATOLOGIC A: Leukocytosis; improved On Chronic Coumadin Normocytic anemia DVT PPx Supratherapeutic INR P:  SCD's Goal INR 2-3; start heparin once < 2 Monitor for bleeding Repeat CBC, INR in AM  INFECTIOUS A: Likely Aspiration Pneumonia P:  Continue Unasyn Follow cultures  ENDOCRINE A: Hyperglycemia P:  CBG's q4h  NEUROLOGIC A: Sedation Likely Anoxic Brain Injury Paralyzed  P:  Consider neurology consult pending neurological recovery Fentanyl gtt, Versed gtt Nimbex gtt; will discontinue once 36 C   FAMILY  - Updates: Family updated 1/18  Lauris Chroman, MD PGY-3, Internal Medicine Pager: [phone]  Attending Note:  55 year old female with extensive cardiac history, s/p MV replacement, on exam lungs are clear with dullness to percussion.  CXR I reviewed myself with evidence of pleural effusion.  She is in the process of warming right now.   Once paralytics and sedative are off, will need to more thorough neurological testing.  Will likely call neuro in AM with EEG and MRI to be ordered in AM.  Daughter updated bedside.  Discussed with CCM resident and bedside RN.  The patient is critically ill with multiple organ systems failure and requires high complexity decision making for assessment and support, frequent evaluation and titration of therapies, application of advanced monitoring technologies and extensive interpretation of multiple databases.   Critical Care Time devoted to patient care services described in this note is  35  Minutes. This time reflects time of care of  this signee Dr Koren Bound. This critical care time does not reflect procedure time, or teaching time or supervisory time of PA/NP/Med student/Med Resident etc but could involve care discussion time.  Alyson Reedy, M.D. [facility] Pulmonary/Critical Care Medicine. Pager: [phone]. After hours pager: [phone].  08/07/2015, 7:14 AM

## 2015-08-07 NOTE — Progress Notes (Signed)
Pt HR 60-80s, irregular (A. Fib) on the monitor. Cardiology rounding at bedside and aware. No new orders received. Will continue to monitor closely.

## 2015-08-07 NOTE — Progress Notes (Signed)
E-Link notified of labile pressure. After bathing patient her pressure went down to 40's systolic and stayed there for a few minutes. Titrated levophed aggressively to get pressure back up and her systolic went greater than 200's. Backed down on levophed and will continue to monitor / titrate. Pressures remain extremely labile.

## 2015-08-07 NOTE — Progress Notes (Addendum)
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 BP labile overnight, hopefully will stabilize with rewarming. Remains intubated, sedated on vent. On levophed. INR remains elevated.  Objective:  Temp:  [90.9 F (32.7 C)-97 F (36.1 C)] 97 F (36.1 C) (01/19 1120) Pulse Rate:  [79-112] 112 (01/19 1120) Resp:  [17-18] 18 (01/19 1120) BP: (83-127)/(51-77) 83/53 mmHg (01/19 1120) SpO2:  [100 %] 100 % (01/19 1120) Arterial Line BP: (46-238)/(29-109) 90/55 mmHg (01/19 1045) FiO2 (%):  [40 %-50 %] 40 % (01/19 1120) Weight:  [132 lb 12.5 oz (60.23 kg)] 132 lb 12.5 oz (60.23 kg) (01/19 1000) Weight change:   Intake/Output from previous day: 01/18 0701 - 01/19 0700 In: 1432.5 [I.V.:1082.5; IV Piggyback:350] Out: 8101 [Urine:1725]  Intake/Output from this shift: Total I/O In: 176.3 [I.V.:176.3] Out: 45 [Urine:45]  Medications: Current Facility-Administered Medications  Medication Dose Route Frequency Provider Last Rate Last Dose  . ampicillin-sulbactam (UNASYN) 1.5 g in sodium chloride 0.9 % 50 mL IVPB  1.5 g Intravenous Q6H Thuy D Dang, RPH   1.5 g at 08/07/15 1128  . antiseptic oral rinse solution (CORINZ)  7 mL Mouth Rinse 10 times per day Corky Sox, MD      . artificial tears (LACRILUBE) ophthalmic ointment 1 application  1 application Both Eyes 3 times per day Jones Bales, MD   1 application at 75/10/25 [phone]  . chlorhexidine gluconate (PERIDEX) 0.12 % solution 15 mL  15 mL Mouth Rinse BID Corky Sox, MD   0 mL at 08/07/15 1120  . cisatracurium (NIMBEX) 200 mg in sodium chloride 0.9 % 200 mL (1 mg/mL) infusion  1-1.5 mcg/kg/min Intravenous Continuous Jones Bales, MD   Stopped at 08/07/15 1119   And  . cisatracurium (NIMBEX) bolus via infusion 2.9 mg  0.05 mg/kg Intravenous PRN Jones Bales, MD      . fentaNYL (SUBLIMAZE) 2,500 mcg in sodium chloride 0.9 % 250 mL (10 mcg/mL) infusion  25-400 mcg/hr  Intravenous Continuous Jones Bales, MD 35 mL/hr at 08/07/15 0740 350 mcg/hr at 08/07/15 0740  . fentaNYL (SUBLIMAZE) bolus via infusion 50 mcg  50 mcg Intravenous Q30 min PRN Jones Bales, MD      . insulin aspart (novoLOG) injection 0-9 Units  0-9 Units Subcutaneous 6 times per day Raylene Miyamoto, MD   3 Units at 08/06/15 0400  . midazolam (VERSED) 50 mg in sodium chloride 0.9 % 50 mL (1 mg/mL) infusion  1-10 mg/hr Intravenous Continuous Jones Bales, MD 8 mL/hr at 08/07/15 1120 8 mg/hr at 08/07/15 1120  . midazolam (VERSED) bolus via infusion 2 mg  2 mg Intravenous Q30 min PRN Jones Bales, MD      . norepinephrine (LEVOPHED) 16 mg in dextrose 5 % 250 mL (0.064 mg/mL) infusion  0-50 mcg/min Intravenous Titrated Juanito Doom, MD 11.3 mL/hr at 08/07/15 1045 12 mcg/min at 08/07/15 1045  . pantoprazole (PROTONIX) injection 40 mg  40 mg Intravenous QHS Jones Bales, MD   40 mg at 08/06/15 2211  . sodium chloride 0.9 % injection 10-40 mL  10-40 mL Intracatheter Q12H Juanito Doom, MD   10 mL at 08/07/15 1011  . sodium chloride 0.9 % injection 10-40 mL  10-40 mL Intracatheter PRN Juanito Doom, MD        Physical Exam: General appearance: sedated, paralyzed and intubated on vent on hypothermia protocol Neck:  no carotid bruit and no JVD Lungs: diminished breath sounds bilaterally Heart: regular rate and rhythm Abdomen: soft, non-tender; bowel sounds normal; no masses,  no organomegaly Extremities: extremities normal, atraumatic, no cyanosis or edema Pulses: 2+ and symmetric Skin: pale, cool, dry Neurologic: Mental status: Intubated, sedated and paralyzed on vent .  Lab Results: Results for orders placed or performed during the hospital encounter of 08/05/15 (from the past 48 hour(s))  Comprehensive metabolic panel     Status: Abnormal   Collection Time: 08/05/15  8:06 PM  Result Value Ref Range   Sodium 127 (L) 135 - 145 mmol/L   Potassium 3.4 (L) 3.5 -  5.1 mmol/L   Chloride 92 (L) 101 - 111 mmol/L   CO2 22 22 - 32 mmol/L   Glucose, Bld 211 (H) 65 - 99 mg/dL   BUN 15 6 - 20 mg/dL   Creatinine, Ser 0.74 0.44 - 1.00 mg/dL   Calcium 7.7 (L) 8.9 - 10.3 mg/dL   Total Protein 5.1 (L) 6.5 - 8.1 g/dL   Albumin 2.6 (L) 3.5 - 5.0 g/dL   AST 38 15 - 41 U/L   ALT 33 14 - 54 U/L   Alkaline Phosphatase 76 38 - 126 U/L   Total Bilirubin 0.8 0.3 - 1.2 mg/dL   GFR calc non Af Amer >60 >60 mL/min   GFR calc Af Amer >60 >60 mL/min    Comment: (NOTE) The eGFR has been calculated using the CKD EPI equation. This calculation has not been validated in all clinical situations. eGFR's persistently <60 mL/min signify possible Chronic Kidney Disease.    Anion gap 13 5 - 15  CBC with Differential     Status: Abnormal   Collection Time: 08/05/15  8:06 PM  Result Value Ref Range   WBC 33.9 (H) 4.0 - 10.5 K/uL   RBC 2.95 (L) 3.87 - 5.11 MIL/uL   Hemoglobin 9.2 (L) 12.0 - 15.0 g/dL   HCT 28.8 (L) 36.0 - 46.0 %   MCV 97.6 78.0 - 100.0 fL   MCH 31.2 26.0 - 34.0 pg   MCHC 31.9 30.0 - 36.0 g/dL   RDW 14.3 11.5 - 15.5 %   Platelets 354 150 - 400 K/uL   Neutrophils Relative % 85 %   Lymphocytes Relative 10 %   Monocytes Relative 5 %   Eosinophils Relative 0 %   Basophils Relative 0 %   Neutro Abs 28.8 (H) 1.7 - 7.7 K/uL   Lymphs Abs 3.4 0.7 - 4.0 K/uL   Monocytes Absolute 1.7 (H) 0.1 - 1.0 K/uL   Eosinophils Absolute 0.0 0.0 - 0.7 K/uL   Basophils Absolute 0.0 0.0 - 0.1 K/uL   WBC Morphology MILD LEFT SHIFT (1-5% METAS, OCC MYELO, OCC BANDS)   I-stat troponin, ED     Status: Abnormal   Collection Time: 08/05/15  8:06 PM  Result Value Ref Range   Troponin i, poc 0.35 (HH) 0.00 - 0.08 ng/mL   Comment NOTIFIED PHYSICIAN    Comment 3            Comment: Due to the release kinetics of cTnI, a negative result within the first hours of the onset of symptoms does not rule out myocardial infarction with certainty. If myocardial infarction is still  suspected, repeat the test at appropriate intervals.   Lactic acid, plasma     Status: Abnormal   Collection Time: 08/05/15  8:07 PM  Result Value Ref Range   Lactic Acid, Venous 5.7 (  HH) 0.5 - 2.0 mmol/L    Comment: CRITICAL RESULT CALLED TO, READ BACK BY AND VERIFIED WITH: J SERRAINOLO,RN 2116 08/05/2015 WBOND   I-stat Chem 8, ED     Status: Abnormal   Collection Time: 08/05/15  8:08 PM  Result Value Ref Range   Sodium 129 (L) 135 - 145 mmol/L   Potassium 3.3 (L) 3.5 - 5.1 mmol/L   Chloride 91 (L) 101 - 111 mmol/L   BUN 18 6 - 20 mg/dL   Creatinine, Ser 0.60 0.44 - 1.00 mg/dL   Glucose, Bld 202 (H) 65 - 99 mg/dL   Calcium, Ion 1.03 (L) 1.12 - 1.23 mmol/L   TCO2 23 0 - 100 mmol/L   Hemoglobin 11.2 (L) 12.0 - 15.0 g/dL   HCT 33.0 (L) 36.0 - 46.0 %  Osmolality     Status: Abnormal   Collection Time: 08/05/15  9:16 PM  Result Value Ref Range   Osmolality 272 (L) 275 - 295 mOsm/kg  I-Stat Troponin, ED (not at [facility])     Status: Abnormal   Collection Time: 08/05/15  9:32 PM  Result Value Ref Range   Troponin i, poc 0.42 (HH) 0.00 - 0.08 ng/mL   Comment NOTIFIED PHYSICIAN    Comment 3            Comment: Due to the release kinetics of cTnI, a negative result within the first hours of the onset of symptoms does not rule out myocardial infarction with certainty. If myocardial infarction is still suspected, repeat the test at appropriate intervals.   Lactic acid, plasma     Status: Abnormal   Collection Time: 08/05/15  9:35 PM  Result Value Ref Range   Lactic Acid, Venous 5.4 (HH) 0.5 - 2.0 mmol/L    Comment: CRITICAL RESULT CALLED TO, READ BACK BY AND VERIFIED WITH: SNOW,N RN 08/05/2015 2246 JORDANS   Magnesium     Status: None   Collection Time: 08/05/15  9:35 PM  Result Value Ref Range   Magnesium 2.3 1.7 - 2.4 mg/dL  TSH     Status: None   Collection Time: 08/05/15  9:35 PM  Result Value Ref Range   TSH 2.455 0.350 - 4.500 uIU/mL  Troponin I     Status: Abnormal    Collection Time: 08/05/15  9:35 PM  Result Value Ref Range   Troponin I 0.29 (H) <0.031 ng/mL    Comment:        PERSISTENTLY INCREASED TROPONIN VALUES IN THE RANGE OF 0.04-0.49 ng/mL CAN BE SEEN IN:       -UNSTABLE ANGINA       -CONGESTIVE HEART FAILURE       -MYOCARDITIS       -CHEST TRAUMA       -ARRYHTHMIAS       -LATE PRESENTING MYOCARDIAL INFARCTION       -COPD   CLINICAL FOLLOW-UP RECOMMENDED.   Protime-INR     Status: Abnormal   Collection Time: 08/05/15  9:35 PM  Result Value Ref Range   Prothrombin Time 26.8 (H) 11.6 - 15.2 seconds   INR 2.51 (H) 0.00 - 1.49  I-Stat arterial blood gas, ED     Status: Abnormal   Collection Time: 08/05/15  9:48 PM  Result Value Ref Range   pH, Arterial 7.445 7.350 - 7.450   pCO2 arterial 28.7 (L) 35.0 - 45.0 mmHg   pO2, Arterial 66.0 (L) 80.0 - 100.0 mmHg   Bicarbonate 20.3 20.0 - 24.0 mEq/L  TCO2 21 0 - 100 mmol/L   O2 Saturation 96.0 %   Acid-base deficit 4.0 (H) 0.0 - 2.0 mmol/L   Patient temperature 33.7 C    Collection site RADIAL, ALLEN'S TEST ACCEPTABLE    Drawn by RT    Sample type ARTERIAL   MRSA PCR Screening     Status: None   Collection Time: 08/05/15 10:35 PM  Result Value Ref Range   MRSA by PCR NEGATIVE NEGATIVE    Comment:        The GeneXpert MRSA Assay (FDA approved for NASAL specimens only), is one component of a comprehensive MRSA colonization surveillance program. It is not intended to diagnose MRSA infection nor to guide or monitor treatment for MRSA infections.   I-STAT 3, arterial blood gas (G3+)     Status: Abnormal   Collection Time: 08/05/15 11:40 PM  Result Value Ref Range   pH, Arterial 7.407 7.350 - 7.450   pCO2 arterial 33.0 (L) 35.0 - 45.0 mmHg   pO2, Arterial 56.0 (L) 80.0 - 100.0 mmHg   Bicarbonate 22.0 20.0 - 24.0 mEq/L   TCO2 23 0 - 100 mmol/L   O2 Saturation 95.0 %   Acid-base deficit 4.0 (H) 0.0 - 2.0 mmol/L   Patient temperature 89.42 F    Sample type ARTERIAL   Troponin I      Status: Abnormal   Collection Time: 08/05/15 11:51 PM  Result Value Ref Range   Troponin I 0.31 (H) <0.031 ng/mL    Comment:        PERSISTENTLY INCREASED TROPONIN VALUES IN THE RANGE OF 0.04-0.49 ng/mL CAN BE SEEN IN:       -UNSTABLE ANGINA       -CONGESTIVE HEART FAILURE       -MYOCARDITIS       -CHEST TRAUMA       -ARRYHTHMIAS       -LATE PRESENTING MYOCARDIAL INFARCTION       -COPD   CLINICAL FOLLOW-UP RECOMMENDED.   Basic metabolic panel     Status: Abnormal   Collection Time: 08/05/15 11:51 PM  Result Value Ref Range   Sodium 126 (L) 135 - 145 mmol/L   Potassium 3.0 (L) 3.5 - 5.1 mmol/L   Chloride 90 (L) 101 - 111 mmol/L   CO2 22 22 - 32 mmol/L   Glucose, Bld 283 (H) 65 - 99 mg/dL   BUN 14 6 - 20 mg/dL   Creatinine, Ser 0.65 0.44 - 1.00 mg/dL   Calcium 7.9 (L) 8.9 - 10.3 mg/dL   GFR calc non Af Amer >60 >60 mL/min   GFR calc Af Amer >60 >60 mL/min    Comment: (NOTE) The eGFR has been calculated using the CKD EPI equation. This calculation has not been validated in all clinical situations. eGFR's persistently <60 mL/min signify possible Chronic Kidney Disease.    Anion gap 14 5 - 15  Protime-INR now and repeat in 8 hours     Status: Abnormal   Collection Time: 08/05/15 11:51 PM  Result Value Ref Range   Prothrombin Time 28.4 (H) 11.6 - 15.2 seconds   INR 2.71 (H) 0.00 - 1.49  APTT now and repeat in 8 hours     Status: Abnormal   Collection Time: 08/05/15 11:51 PM  Result Value Ref Range   aPTT 56 (H) 24 - 37 seconds    Comment:        IF BASELINE aPTT IS ELEVATED, SUGGEST PATIENT RISK ASSESSMENT BE USED TO  DETERMINE APPROPRIATE ANTICOAGULANT THERAPY.   Glucose, capillary     Status: Abnormal   Collection Time: 08/06/15 12:07 AM  Result Value Ref Range   Glucose-Capillary 240 (H) 65 - 99 mg/dL   Comment 1 Capillary Specimen   Sodium, urine, random     Status: None   Collection Time: 08/06/15  1:19 AM  Result Value Ref Range   Sodium, Ur 96 mmol/L    Osmolality, urine     Status: None   Collection Time: 08/06/15  1:19 AM  Result Value Ref Range   Osmolality, Ur 452 300 - 900 mOsm/kg  Glucose, capillary     Status: Abnormal   Collection Time: 08/06/15  1:48 AM  Result Value Ref Range   Glucose-Capillary 278 (H) 65 - 99 mg/dL   Comment 1 Arterial Specimen   Troponin I     Status: Abnormal   Collection Time: 08/06/15  2:00 AM  Result Value Ref Range   Troponin I 0.52 (HH) <0.031 ng/mL    Comment:        POSSIBLE MYOCARDIAL ISCHEMIA. SERIAL TESTING RECOMMENDED. CRITICAL RESULT CALLED TO, READ BACK BY AND VERIFIED WITH: SNOW,N RN 08/06/2015 0437 JORDANS AGE OF SPECIMEN MAY AFFECT INTEGRITY OF RESULTS   Basic metabolic panel     Status: Abnormal   Collection Time: 08/06/15  2:00 AM  Result Value Ref Range   Sodium 127 (L) 135 - 145 mmol/L   Potassium 3.5 3.5 - 5.1 mmol/L   Chloride 92 (L) 101 - 111 mmol/L   CO2 22 22 - 32 mmol/L   Glucose, Bld 282 (H) 65 - 99 mg/dL   BUN 15 6 - 20 mg/dL   Creatinine, Ser 0.73 0.44 - 1.00 mg/dL   Calcium 8.1 (L) 8.9 - 10.3 mg/dL   GFR calc non Af Amer >60 >60 mL/min   GFR calc Af Amer >60 >60 mL/min    Comment: (NOTE) The eGFR has been calculated using the CKD EPI equation. This calculation has not been validated in all clinical situations. eGFR's persistently <60 mL/min signify possible Chronic Kidney Disease.    Anion gap 13 5 - 15  Glucose, capillary     Status: Abnormal   Collection Time: 08/06/15  3:31 AM  Result Value Ref Range   Glucose-Capillary 157 (H) 65 - 99 mg/dL   Comment 1 Arterial Specimen   Blood gas, arterial     Status: Abnormal   Collection Time: 08/06/15  3:45 AM  Result Value Ref Range   FIO2 1.00    Delivery systems VENTILATOR    Mode PRESSURE REGULATED VOLUME CONTROL    VT 400 mL   LHR 18 resp/min   Peep/cpap 10.0 cm H20   pH, Arterial 7.496 (H) 7.350 - 7.450   pCO2 arterial 27.8 (L) 35.0 - 45.0 mmHg   pO2, Arterial 234 (H) 80.0 - 100.0 mmHg    Bicarbonate 22.7 20.0 - 24.0 mEq/L   TCO2 23.7 0 - 100 mmol/L   Acid-base deficit 1.2 0.0 - 2.0 mmol/L   O2 Saturation 99.8 %   Patient temperature 89.6    Collection site A-LINE    Drawn by [phone]    Sample type ARTERIAL    Allens test (pass/fail) NOT INDICATED (A) PASS  Glucose, capillary     Status: Abnormal   Collection Time: 08/06/15  3:57 AM  Result Value Ref Range   Glucose-Capillary 218 (H) 65 - 99 mg/dL   Comment 1 Capillary Specimen   Troponin I  Status: Abnormal   Collection Time: 08/06/15  4:00 AM  Result Value Ref Range   Troponin I 0.58 (HH) <0.031 ng/mL    Comment:        POSSIBLE MYOCARDIAL ISCHEMIA. SERIAL TESTING RECOMMENDED. CRITICAL VALUE NOTED.  VALUE IS CONSISTENT WITH PREVIOUSLY REPORTED AND CALLED VALUE.   Basic metabolic panel     Status: Abnormal   Collection Time: 08/06/15  4:00 AM  Result Value Ref Range   Sodium 130 (L) 135 - 145 mmol/L   Potassium 4.3 3.5 - 5.1 mmol/L    Comment: DELTA CHECK NOTED   Chloride 95 (L) 101 - 111 mmol/L   CO2 24 22 - 32 mmol/L   Glucose, Bld 239 (H) 65 - 99 mg/dL   BUN 13 6 - 20 mg/dL   Creatinine, Ser 0.55 0.44 - 1.00 mg/dL   Calcium 8.3 (L) 8.9 - 10.3 mg/dL   GFR calc non Af Amer >60 >60 mL/min   GFR calc Af Amer >60 >60 mL/min    Comment: (NOTE) The eGFR has been calculated using the CKD EPI equation. This calculation has not been validated in all clinical situations. eGFR's persistently <60 mL/min signify possible Chronic Kidney Disease.    Anion gap 11 5 - 15  Magnesium     Status: None   Collection Time: 08/06/15  4:00 AM  Result Value Ref Range   Magnesium 1.9 1.7 - 2.4 mg/dL  Phosphorus     Status: Abnormal   Collection Time: 08/06/15  4:00 AM  Result Value Ref Range   Phosphorus 1.4 (L) 2.5 - 4.6 mg/dL  CBC     Status: Abnormal   Collection Time: 08/06/15  4:00 AM  Result Value Ref Range   WBC 39.1 (H) 4.0 - 10.5 K/uL   RBC 3.43 (L) 3.87 - 5.11 MIL/uL   Hemoglobin 11.1 (L) 12.0 - 15.0 g/dL    HCT 31.7 (L) 36.0 - 46.0 %   MCV 92.4 78.0 - 100.0 fL   MCH 32.4 26.0 - 34.0 pg   MCHC 35.0 30.0 - 36.0 g/dL   RDW 14.2 11.5 - 15.5 %   Platelets 394 150 - 400 K/uL  Glucose, capillary     Status: Abnormal   Collection Time: 08/06/15  5:12 AM  Result Value Ref Range   Glucose-Capillary 179 (H) 65 - 99 mg/dL   Comment 1 Arterial Specimen   Basic metabolic panel     Status: Abnormal   Collection Time: 08/06/15  6:34 AM  Result Value Ref Range   Sodium 129 (L) 135 - 145 mmol/L   Potassium 4.2 3.5 - 5.1 mmol/L   Chloride 99 (L) 101 - 111 mmol/L   CO2 23 22 - 32 mmol/L   Glucose, Bld 163 (H) 65 - 99 mg/dL   BUN 11 6 - 20 mg/dL   Creatinine, Ser 0.52 0.44 - 1.00 mg/dL   Calcium 8.1 (L) 8.9 - 10.3 mg/dL   GFR calc non Af Amer >60 >60 mL/min   GFR calc Af Amer >60 >60 mL/min    Comment: (NOTE) The eGFR has been calculated using the CKD EPI equation. This calculation has not been validated in all clinical situations. eGFR's persistently <60 mL/min signify possible Chronic Kidney Disease.    Anion gap 7 5 - 15  Protime-INR now and repeat in 8 hours     Status: Abnormal   Collection Time: 08/06/15  6:34 AM  Result Value Ref Range   Prothrombin Time 27.0 (H) 11.6 -  15.2 seconds   INR 2.54 (H) 0.00 - 1.49  APTT now and repeat in 8 hours     Status: Abnormal   Collection Time: 08/06/15  6:34 AM  Result Value Ref Range   aPTT 38 (H) 24 - 37 seconds    Comment:        IF BASELINE aPTT IS ELEVATED, SUGGEST PATIENT RISK ASSESSMENT BE USED TO DETERMINE APPROPRIATE ANTICOAGULANT THERAPY.   Glucose, capillary     Status: Abnormal   Collection Time: 08/06/15  6:55 AM  Result Value Ref Range   Glucose-Capillary 143 (H) 65 - 99 mg/dL   Comment 1 Arterial Specimen   Culture, respiratory (NON-Expectorated)     Status: None (Preliminary result)   Collection Time: 08/06/15  8:13 AM  Result Value Ref Range   Specimen Description TRACHEAL ASPIRATE    Special Requests NONE    Gram Stain       MODERATE WBC PRESENT,BOTH PMN AND MONONUCLEAR NO SQUAMOUS EPITHELIAL CELLS SEEN MODERATE GRAM POSITIVE COCCI IN PAIRS IN CHAINS Performed at [facility]    Culture      Culture reincubated for better growth Performed at [facility]    Report Status PENDING   Glucose, capillary     Status: Abnormal   Collection Time: 08/06/15  8:27 AM  Result Value Ref Range   Glucose-Capillary 119 (H) 65 - 99 mg/dL   Comment 1 Notify RN   Urine culture     Status: None   Collection Time: 08/06/15  8:44 AM  Result Value Ref Range   Specimen Description URINE, CATHETERIZED    Special Requests NONE    Culture NO GROWTH 1 DAY    Report Status 08/07/2015 FINAL   Troponin I     Status: Abnormal   Collection Time: 08/06/15  8:45 AM  Result Value Ref Range   Troponin I 0.65 (HH) <0.031 ng/mL    Comment:        POSSIBLE MYOCARDIAL ISCHEMIA. SERIAL TESTING RECOMMENDED. CRITICAL VALUE NOTED.  VALUE IS CONSISTENT WITH PREVIOUSLY REPORTED AND CALLED VALUE.   Culture, blood (routine x 2)     Status: None (Preliminary result)   Collection Time: 08/06/15  8:55 AM  Result Value Ref Range   Specimen Description BLOOD RIGHT HAND    Special Requests IN PEDIATRIC BOTTLE 2CC    Culture PENDING    Report Status PENDING   I-STAT, chem 8     Status: Abnormal   Collection Time: 08/06/15 10:09 AM  Result Value Ref Range   Sodium 133 (L) 135 - 145 mmol/L   Potassium 3.5 3.5 - 5.1 mmol/L   Chloride 96 (L) 101 - 111 mmol/L   BUN 10 6 - 20 mg/dL   Creatinine, Ser 0.30 (L) 0.44 - 1.00 mg/dL   Glucose, Bld 127 (H) 65 - 99 mg/dL   Calcium, Ion 1.12 1.12 - 1.23 mmol/L   TCO2 25 0 - 100 mmol/L   Hemoglobin 11.9 (L) 12.0 - 15.0 g/dL   HCT 35.0 (L) 36.0 - 46.0 %  Glucose, capillary     Status: Abnormal   Collection Time: 08/06/15 12:16 PM  Result Value Ref Range   Glucose-Capillary 108 (H) 65 - 99 mg/dL  I-STAT, chem 8     Status: Abnormal   Collection Time: 08/06/15  2:10 PM  Result Value  Ref Range   Sodium 134 (L) 135 - 145 mmol/L   Potassium 3.3 (L) 3.5 - 5.1 mmol/L   Chloride  95 (L) 101 - 111 mmol/L   BUN 9 6 - 20 mg/dL   Creatinine, Ser 0.30 (L) 0.44 - 1.00 mg/dL   Glucose, Bld 109 (H) 65 - 99 mg/dL   Calcium, Ion 1.11 (L) 1.12 - 1.23 mmol/L   TCO2 25 0 - 100 mmol/L   Hemoglobin 12.2 12.0 - 15.0 g/dL   HCT 36.0 36.0 - 46.0 %  Glucose, capillary     Status: None   Collection Time: 08/06/15  4:18 PM  Result Value Ref Range   Glucose-Capillary 89 65 - 99 mg/dL   Comment 1 Notify RN   I-STAT, chem 8     Status: Abnormal   Collection Time: 08/06/15  6:03 PM  Result Value Ref Range   Sodium 132 (L) 135 - 145 mmol/L   Potassium 4.2 3.5 - 5.1 mmol/L   Chloride 97 (L) 101 - 111 mmol/L   BUN 8 6 - 20 mg/dL   Creatinine, Ser 0.30 (L) 0.44 - 1.00 mg/dL   Glucose, Bld 97 65 - 99 mg/dL   Calcium, Ion 1.11 (L) 1.12 - 1.23 mmol/L   TCO2 22 0 - 100 mmol/L   Hemoglobin 11.6 (L) 12.0 - 15.0 g/dL   HCT 34.0 (L) 36.0 - 46.0 %  Glucose, capillary     Status: None   Collection Time: 08/06/15  8:28 PM  Result Value Ref Range   Glucose-Capillary 84 65 - 99 mg/dL   Comment 1 Arterial Specimen   I-STAT, chem 8     Status: Abnormal   Collection Time: 08/06/15 10:22 PM  Result Value Ref Range   Sodium 133 (L) 135 - 145 mmol/L   Potassium 3.6 3.5 - 5.1 mmol/L   Chloride 97 (L) 101 - 111 mmol/L   BUN 8 6 - 20 mg/dL   Creatinine, Ser 0.30 (L) 0.44 - 1.00 mg/dL   Glucose, Bld 94 65 - 99 mg/dL   Calcium, Ion 1.10 (L) 1.12 - 1.23 mmol/L   TCO2 20 0 - 100 mmol/L   Hemoglobin 11.9 (L) 12.0 - 15.0 g/dL   HCT 35.0 (L) 36.0 - 46.0 %  Glucose, capillary     Status: None   Collection Time: 08/07/15 12:15 AM  Result Value Ref Range   Glucose-Capillary 85 65 - 99 mg/dL  I-STAT, chem 8     Status: Abnormal   Collection Time: 08/07/15  2:23 AM  Result Value Ref Range   Sodium 133 (L) 135 - 145 mmol/L   Potassium 3.8 3.5 - 5.1 mmol/L   Chloride 97 (L) 101 - 111 mmol/L   BUN 7 6 - 20  mg/dL   Creatinine, Ser 0.30 (L) 0.44 - 1.00 mg/dL   Glucose, Bld 94 65 - 99 mg/dL   Calcium, Ion 1.10 (L) 1.12 - 1.23 mmol/L   TCO2 20 0 - 100 mmol/L   Hemoglobin 12.6 12.0 - 15.0 g/dL   HCT 37.0 36.0 - 46.0 %  Glucose, capillary     Status: None   Collection Time: 08/07/15  4:37 AM  Result Value Ref Range   Glucose-Capillary 97 65 - 99 mg/dL   Comment 1 Arterial Specimen   I-STAT, chem 8     Status: Abnormal   Collection Time: 08/07/15  6:10 AM  Result Value Ref Range   Sodium 133 (L) 135 - 145 mmol/L   Potassium 3.7 3.5 - 5.1 mmol/L   Chloride 98 (L) 101 - 111 mmol/L   BUN 8 6 - 20 mg/dL  Creatinine, Ser 0.40 (L) 0.44 - 1.00 mg/dL   Glucose, Bld 107 (H) 65 - 99 mg/dL   Calcium, Ion 1.09 (L) 1.12 - 1.23 mmol/L   TCO2 19 0 - 100 mmol/L   Hemoglobin 13.3 12.0 - 15.0 g/dL   HCT 39.0 36.0 - 46.0 %  CBC with Differential/Platelet     Status: Abnormal   Collection Time: 08/07/15  7:51 AM  Result Value Ref Range   WBC 19.7 (H) 4.0 - 10.5 K/uL   RBC 3.60 (L) 3.87 - 5.11 MIL/uL   Hemoglobin 11.6 (L) 12.0 - 15.0 g/dL   HCT 33.4 (L) 36.0 - 46.0 %   MCV 92.8 78.0 - 100.0 fL   MCH 32.2 26.0 - 34.0 pg   MCHC 34.7 30.0 - 36.0 g/dL   RDW 15.2 11.5 - 15.5 %   Platelets 321 150 - 400 K/uL   Neutrophils Relative % 86 %   Neutro Abs 16.9 (H) 1.7 - 7.7 K/uL   Lymphocytes Relative 8 %   Lymphs Abs 1.5 0.7 - 4.0 K/uL   Monocytes Relative 6 %   Monocytes Absolute 1.2 (H) 0.1 - 1.0 K/uL   Eosinophils Relative 0 %   Eosinophils Absolute 0.0 0.0 - 0.7 K/uL   Basophils Relative 0 %   Basophils Absolute 0.0 0.0 - 0.1 K/uL  Basic metabolic panel     Status: Abnormal   Collection Time: 08/07/15  7:51 AM  Result Value Ref Range   Sodium 133 (L) 135 - 145 mmol/L   Potassium 4.2 3.5 - 5.1 mmol/L   Chloride 100 (L) 101 - 111 mmol/L   CO2 19 (L) 22 - 32 mmol/L   Glucose, Bld 106 (H) 65 - 99 mg/dL   BUN 9 6 - 20 mg/dL   Creatinine, Ser 0.64 0.44 - 1.00 mg/dL   Calcium 8.2 (L) 8.9 - 10.3 mg/dL    GFR calc non Af Amer >60 >60 mL/min   GFR calc Af Amer >60 >60 mL/min    Comment: (NOTE) The eGFR has been calculated using the CKD EPI equation. This calculation has not been validated in all clinical situations. eGFR's persistently <60 mL/min signify possible Chronic Kidney Disease.    Anion gap 14 5 - 15  Protime-INR     Status: Abnormal   Collection Time: 08/07/15  7:55 AM  Result Value Ref Range   Prothrombin Time 40.2 (H) 11.6 - 15.2 seconds   INR 4.31 (H) 0.00 - 1.49  Magnesium     Status: None   Collection Time: 08/07/15  7:55 AM  Result Value Ref Range   Magnesium 2.1 1.7 - 2.4 mg/dL  Phosphorus     Status: None   Collection Time: 08/07/15  7:55 AM  Result Value Ref Range   Phosphorus 3.6 2.5 - 4.6 mg/dL  Glucose, capillary     Status: None   Collection Time: 08/07/15  8:31 AM  Result Value Ref Range   Glucose-Capillary 97 65 - 99 mg/dL  Hepatic function panel     Status: Abnormal   Collection Time: 08/07/15  9:23 AM  Result Value Ref Range   Total Protein 5.3 (L) 6.5 - 8.1 g/dL   Albumin 2.4 (L) 3.5 - 5.0 g/dL   AST 31 15 - 41 U/L   ALT 30 14 - 54 U/L   Alkaline Phosphatase 97 38 - 126 U/L   Total Bilirubin 1.4 (H) 0.3 - 1.2 mg/dL   Bilirubin, Direct 0.2 0.1 - 0.5 mg/dL  Indirect Bilirubin 1.2 (H) 0.3 - 0.9 mg/dL  I-STAT, chem 8     Status: Abnormal   Collection Time: 08/07/15 10:24 AM  Result Value Ref Range   Sodium 131 (L) 135 - 145 mmol/L   Potassium 4.1 3.5 - 5.1 mmol/L   Chloride 102 101 - 111 mmol/L   BUN 9 6 - 20 mg/dL   Creatinine, Ser 0.40 (L) 0.44 - 1.00 mg/dL   Glucose, Bld 107 (H) 65 - 99 mg/dL   Calcium, Ion 1.07 (L) 1.12 - 1.23 mmol/L   TCO2 18 0 - 100 mmol/L   Hemoglobin 12.6 12.0 - 15.0 g/dL   HCT 37.0 36.0 - 46.0 %  I-STAT 3, arterial blood gas (G3+)     Status: Abnormal   Collection Time: 08/07/15 11:32 AM  Result Value Ref Range   pH, Arterial 7.444 7.350 - 7.450   pCO2 arterial 26.6 (L) 35.0 - 45.0 mmHg   pO2, Arterial 122.0  (H) 80.0 - 100.0 mmHg   Bicarbonate 18.4 (L) 20.0 - 24.0 mEq/L   TCO2 19 0 - 100 mmol/L   O2 Saturation 99.0 %   Acid-base deficit 5.0 (H) 0.0 - 2.0 mmol/L   Patient temperature 36.1 C    Collection site RADIAL, ALLEN'S TEST ACCEPTABLE    Drawn by VP    Sample type ARTERIAL     Imaging: Ct Angio Chest Pe W/cm &/or Wo Cm  08/05/2015  CLINICAL DATA:  55 year old female with shortness of breath. Status post CPR. EXAM: CT ANGIOGRAPHY CHEST WITH CONTRAST TECHNIQUE: Multidetector CT imaging of the chest was performed using the standard protocol during bolus administration of intravenous contrast. Multiplanar CT image reconstructions and MIPs were obtained to evaluate the vascular anatomy. CONTRAST:  52m OMNIPAQUE IOHEXOL 350 MG/ML SOLN COMPARISON:  Radiograph dated 08/05/2015 FINDINGS: An endotracheal tube is noted with tip at the carina tilting towards the right mainstem bronchus. Recommend retraction and repositioning by approximately 3- 4 cm. Moderate bilateral pleural effusions. There is complete consolidation of the lower lobes bilaterally with air bronchograms. There is partial consolidative changes of the left upper lobe and lingula. There is ground-glass opacity within the lingula. Findings may represent pneumonia, or pulmonary edema or ARDS. Clinical correlation is recommended. There is no pneumothorax. The visualized thoracic aorta appears unremarkable. No CT evidence of pulmonary embolism. Top-normal cardiac size. Mechanical mitral valve. Small pockets of air noted within the right atrium, likely iatrogenic from IV injection. There is retrograde flow of contrast from the right atrium into the IVC compatible with a degree of right cardiac dysfunction. No significant pericardial effusion. Small amount of fluid along the left aortic arch in the upper mediastinum may represent complex fluid or small amount of blood/serosanguineous fluid. Small pockets of air noted within this fluid in the upper  mediastinum compatible with small pneumomediastinum. An enteric tube is partially visualized extending into the upper abdomen with tip not included in the CT. There is no axillary adenopathy. There is diffuse subcutaneous soft tissue edema and anasarca. Mild degenerative changes of the spine. Median sternotomy wires age indeterminate fracture of the left first rib, new from prior study possibly acute or subacute. No other acute fracture identified. Review of the MIP images confirms the above findings. IMPRESSION: No CT evidence of pulmonary embolism. Moderate bilateral pleural effusions with complete consolidative changes of the lower lobes and partial consolidative changes of the left upper lobe. Clinical correlation is recommended. Endotracheal tube the tip at the origin of the right mainstem bronchus.  Recommend retraction and repositioning by approximately 3-4 cm. Small high attenuating fluid along the left aspect of the aortic arch may represent complex fluid or small amount of blood. These results were called by telephone at the time of interpretation on 08/05/2015 at 10:59 pm to Dr. Heriberto Antigua , who verbally acknowledged these results. Electronically Signed   By: Anner Crete M.D.   On: 08/05/2015 23:04   Dg Chest Port 1 View  08/07/2015  CLINICAL DATA:  Heart failure. EXAM: PORTABLE CHEST 1 VIEW COMPARISON:  08/05/2015. FINDINGS: Endotracheal tube, NG tube, left IJ line in stable position. Prior cardiac valve replacement. Left atrial appendage clip noted. Partial clearing of bilateral pulmonary infiltrates/edema. Persistent but slightly improved bilateral pleural effusions. No pneumothorax . IMPRESSION: 1. Lines and tubes in stable position. 2. Partial clearing of bilateral pulmonary edema and pleural effusions. 3. Prior cardiac valve replacement.  Stable cardiomegaly. Electronically Signed   By: Marcello Moores  Register   On: 08/07/2015 07:32   Dg Chest Port 1 View  08/05/2015  CLINICAL DATA:  Central  line placement.  Initial encounter. EXAM: PORTABLE CHEST 1 VIEW COMPARISON:  Chest radiograph performed earlier today at 7:10 p.m., and CTA of the chest performed earlier today at 10:15 p.m. FINDINGS: The left IJ line is noted ending about the distal SVC. The patient's endotracheal tube is noted ending 2-3 cm above the carina. An enteric tube is noted extending below the diaphragm. Vascular congestion is again noted. Note is made of small right and small to moderate left pleural effusions, and likely underlying pulmonary edema. No pneumothorax is seen. The cardiomediastinal silhouette is mildly enlarged. The patient is status post median sternotomy. A mitral valve replacement is noted. No acute osseous abnormalities are identified. IMPRESSION: 1. Left IJ line noted ending about the distal SVC. 2. Endotracheal tube noted ending 2-3 cm above the carina. 3. Vascular congestion and mild cardiomegaly. Small right and small to moderate left pleural effusions, with likely underlying pulmonary edema. Electronically Signed   By: Garald Balding M.D.   On: 08/05/2015 23:50   Dg Chest Portable 1 View  08/05/2015  CLINICAL DATA:  Status post CPR. Endotracheal tube placement. Initial encounter. EXAM: PORTABLE CHEST 1 VIEW COMPARISON:  Chest radiograph performed 07/31/2015 FINDINGS: The patient's endotracheal tube is noted ending overlying the right mainstem bronchus, 1 cm below the carina. This should be retracted 4 cm. Diffuse bilateral airspace opacification is noted, with a small to moderate left-sided pleural effusion. This is concerning for pulmonary edema, though pneumonia cannot be excluded. No pneumothorax is seen. The cardiomediastinal silhouette is mildly enlarged. The patient is status post median sternotomy. External pacing pads are noted. No acute osseous abnormalities are seen. IMPRESSION: 1. Endotracheal tube noted ending overlying the right mainstem bronchus, 1 cm below the carina. This should be retracted 4  cm. 2. Diffuse bilateral airspace opacification, with a small to moderate left-sided pleural effusion. This is concerning for pulmonary edema, though pneumonia cannot be excluded. 3. Mild cardiomegaly. These results were called by telephone at the time of interpretation on 08/05/2015 at 9:20 pm to Nursing at the [facility], who verbally acknowledged these results. The endotracheal tube had already been repositioned. Electronically Signed   By: Garald Balding M.D.   On: 08/05/2015 21:50    Assessment:  Principal Problem:   Cardiac arrest [facility]) Active Problems:   Severe mitral regurgitation   Patent ductus arteriosus   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   Long  term (current) use of anticoagulants [Z79.01]   Respiratory arrest ([place])   Acute respiratory failure with hypoxia (HCC)   Anoxic encephalopathy (HCC)   Cardiogenic shock ([place])   Plan:  Hypothermia protocol - now rewarming. Intermittent a-fib, now with alternating bradycardia and regular tachycardia. Unclear etiology of labile BP - may want to consider an alternate pressor such as neosynephrine. Could this be related to CNS injury causing autonomic dysfunction. Discontinue paralytics once rewarmed. INR rising - warfarin on hold. Awaiting additional cardiac testing prior to further cardiac work-up.  CRITICAL CARE:  The patient is critically ill with multi-organ system failure and requires high complexity decision making for assessment and support, frequent evaluation and titration of therapies, application of advanced monitoring technologies and extensive interpretation of multiple databases.  Time Spent Directly with Patient:  35 minutes  Length of Stay:  LOS: 2 days   Pixie Casino, MD, [facility] Attending Cardiologist [place] 08/07/2015, 12:16 PM

## 2015-08-07 NOTE — Progress Notes (Signed)
eLink Physician-Brief Progress Note Patient Name: Diane [name] DOB: October 29, 1960 MRN: [id number]   Date of Service  08/07/2015  HPI/Events of Note  AFIB with RVR - HR 130's - 140's. BP = 160/85. Currently on Norepinephrine and Phenylephrine IV infusions for hemodynamic support.   eICU Interventions  Will order: 1. BMP and Mg++ level now.  2. Amiodarone IV load and infusion.      Intervention Category Major Interventions: Arrhythmia - evaluation and management  Sommer,Steven Eugene 08/07/2015, 8:41 PM

## 2015-08-07 NOTE — Progress Notes (Signed)
      [address]       [place] [address]             [phone]        CARDIOTHORACIC SURGERY PROGRESS NOTE  Subjective: Sedated and paralyzed on vent.  BP reportedly somewhat labile during early phase of rewarming, otherwise no events  Objective: Vital signs: BP Readings from Last 1 Encounters:  08/07/15 94/64   Pulse Readings from Last 1 Encounters:  08/07/15 106   Resp Readings from Last 1 Encounters:  08/07/15 18   Temp Readings from Last 1 Encounters:  08/07/15 94.1 F (34.5 C) Core (Comment)    Hemodynamics: CVP:  [4 mmHg-14 mmHg] 13 mmHg  Physical Exam:  Rhythm:   sinus  Breath sounds: clear  Heart sounds:  RRR  Incisions:  Clean and dry  Abdomen:  Soft, non-distended  Extremities:  Adequately perfused    Intake/Output from previous day: 01/18 0701 - 01/19 0700 In: 1378.3 [I.V.:1028.3; IV Piggyback:350] Out: 1725 [Urine:1725] Intake/Output this shift:    Lab Results:  CBC: Recent Labs  08/06/15 0400  08/07/15 0610 08/07/15 0751  WBC 39.1*  --   --  19.7*  HGB 11.1*  < > 13.3 11.6*  HCT 31.7*  < > 39.0 33.4*  PLT 394  --   --  321  < > = values in this interval not displayed.  BMET:  Recent Labs  08/06/15 0400 08/06/15 0634  08/07/15 0223 08/07/15 0610  NA 130* 129*  < > 133* 133*  K 4.3 4.2  < > 3.8 3.7  CL 95* 99*  < > 97* 98*  CO2 24 23  --   --   --   GLUCOSE 239* 163*  < > 94 107*  BUN 13 11  < > 7 8  CREATININE 0.55 0.52  < > 0.30* 0.40*  CALCIUM 8.3* 8.1*  --   --   --   < > = values in this interval not displayed.   PT/INR:   Recent Labs  08/07/15 0755  LABPROT 40.2*  INR 4.31*    CBG (last 3)   Recent Labs  08/06/15 2028 08/07/15 0015 08/07/15 0437  GLUCAP 84 85 97    ABG    Component Value Date/Time   PHART 7.496* 08/06/2015 0345   PCO2ART 27.8* 08/06/2015 0345   PO2ART 234* 08/06/2015 0345   HCO3 22.7 08/06/2015 0345   TCO2 19 08/07/2015 0610   ACIDBASEDEF 1.2 08/06/2015 0345    O2SAT 99.8 08/06/2015 0345    CXR: PORTABLE CHEST 1 VIEW  COMPARISON: 08/05/2015.  FINDINGS: Endotracheal tube, NG tube, left IJ line in stable position. Prior cardiac valve replacement. Left atrial appendage clip noted. Partial clearing of bilateral pulmonary infiltrates/edema. Persistent but slightly improved bilateral pleural effusions. No pneumothorax .  IMPRESSION: 1. Lines and tubes in stable position. 2. Partial clearing of bilateral pulmonary edema and pleural effusions. 3. Prior cardiac valve replacement. Stable cardiomegaly.   Electronically Signed  By: Maisie Fus Register  On: 08/07/2015 07:32  Assessment/Plan:  Progress and plans noted Will continue to follow closely Nothing further to add at this time   Purcell Nails, MD 08/07/2015 8:22 AM

## 2015-08-07 NOTE — Progress Notes (Signed)
Called for increasing heart rates on telemetry with relative hypotension and increased pressor requirement. I reviewed the EKG strips with Dr. Elberta Fortis (cardiac EP) and we agree this is likely a sinus tachycardia with HR variability. He would not recommend additional treatment at this time given hypotension and agrees to switch levophed over to neo-synephrine to remove any beta-agonist properties.  Chrystie Nose, MD, [facility] Attending Cardiologist [facility]

## 2015-08-07 NOTE — Progress Notes (Addendum)
Patient's telemetry is reviewed, currently ST (reviewed with Dr. Elberta Fortis),  no VT has been observed.  Currently in re-warming process, EP will follow from afar as her clinical condition progresses.  Francis Dowse, PA-C

## 2015-08-07 NOTE — Progress Notes (Signed)
Initial Nutrition Assessment  INTERVENTION:   Once rewarmed recommend: Initiate Vital AF 1.2 @ 25 ml/hr via OG tube and increase by 10 ml every 4 hours to goal rate of 45 ml/hr.   Tube feeding regimen provides 1296 kcal (103% of needs), 81 grams of protein, and 875 ml of H2O.   NUTRITION DIAGNOSIS:   Inadequate oral intake related to inability to eat as evidenced by NPO status.  GOAL:   Patient will meet greater than or equal to 90% of their needs  MONITOR:   Vent status, Labs, I & O's  REASON FOR ASSESSMENT:   Ventilator   ASSESSMENT:   55 y.o. female w/ PMHx of severe MR s/p repair 07/22/15, NICM, chronic combined CHF, presented to [facility] ED after cardiac arrest.  Per MD likely anoxic brain injury.  Rewarming, plan for TF once rewarmed. Currently @ 94.1 degrees.   Patient is currently intubated on ventilator support MV: 6.3 L/min Temp (24hrs), Avg:92.8 F (33.8 C), Min:90.9 F (32.7 C), Max:97 F (36.1 C)  Labs reviewed: sodium low (133) CBG's: 106-107 OG tube  Diet Order:    NPO  Skin:  Reviewed, no issues  Last BM:  unknown  Height:   Ht Readings from Last 1 Encounters:  08/05/15 5\' 2"  (1.575 m)   Weight:   Wt Readings from Last 1 Encounters:  08/07/15 132 lb 12.5 oz (60.23 kg)   Ideal Body Weight:  50 kg  BMI:  Body mass index is 24.28 kg/(m^2).  Estimated Nutritional Needs:   Kcal:  1257  Protein:  75-90 grams  Fluid:  > 1.5 L/day  EDUCATION NEEDS:   No education needs identified at this time  Kendell Bane RD, LDN, CNSC [phone] Pager [phone] After Hours Pager

## 2015-08-07 NOTE — Progress Notes (Signed)
Pt HR sustained on the monitor to 140-150s, BP noted to drop with increased HR. Pt given sedation bolus with no change in clinical picture. EKG obtained. Critical care MD and Cardiology made aware. New orders received. Will continue to monitor closely.

## 2015-08-08 ENCOUNTER — Inpatient Hospital Stay (HOSPITAL_COMMUNITY): Payer: BLUE CROSS/BLUE SHIELD

## 2015-08-08 ENCOUNTER — Encounter: Payer: BLUE CROSS/BLUE SHIELD | Admitting: Pharmacist Clinician (PhC)/ Clinical Pharmacy Specialist

## 2015-08-08 LAB — CBC WITH DIFFERENTIAL/PLATELET
BASOS ABS: 0 10*3/uL (ref 0.0–0.1)
BASOS PCT: 0 %
EOS ABS: 0 10*3/uL (ref 0.0–0.7)
Eosinophils Relative: 0 %
HCT: 31 % — ABNORMAL LOW (ref 36.0–46.0)
Hemoglobin: 10.4 g/dL — ABNORMAL LOW (ref 12.0–15.0)
Lymphocytes Relative: 8 %
Lymphs Abs: 1.1 10*3/uL (ref 0.7–4.0)
MCH: 31.6 pg (ref 26.0–34.0)
MCHC: 33.5 g/dL (ref 30.0–36.0)
MCV: 94.2 fL (ref 78.0–100.0)
MONO ABS: 1 10*3/uL (ref 0.1–1.0)
MONOS PCT: 8 %
Neutro Abs: 10.7 10*3/uL — ABNORMAL HIGH (ref 1.7–7.7)
Neutrophils Relative %: 84 %
PLATELETS: 319 10*3/uL (ref 150–400)
RBC: 3.29 MIL/uL — ABNORMAL LOW (ref 3.87–5.11)
RDW: 15.4 % (ref 11.5–15.5)
WBC: 12.8 10*3/uL — ABNORMAL HIGH (ref 4.0–10.5)

## 2015-08-08 LAB — BASIC METABOLIC PANEL
ANION GAP: 11 (ref 5–15)
BUN: 9 mg/dL (ref 6–20)
CALCIUM: 7.9 mg/dL — AB (ref 8.9–10.3)
CO2: 21 mmol/L — ABNORMAL LOW (ref 22–32)
CREATININE: 0.91 mg/dL (ref 0.44–1.00)
Chloride: 94 mmol/L — ABNORMAL LOW (ref 101–111)
Glucose, Bld: 203 mg/dL — ABNORMAL HIGH (ref 65–99)
Potassium: 4.5 mmol/L (ref 3.5–5.1)
SODIUM: 126 mmol/L — AB (ref 135–145)

## 2015-08-08 LAB — GLUCOSE, CAPILLARY
GLUCOSE-CAPILLARY: 174 mg/dL — AB (ref 65–99)
GLUCOSE-CAPILLARY: 117 mg/dL — AB (ref 65–99)
GLUCOSE-CAPILLARY: 102 mg/dL — AB (ref 65–99)
Glucose-Capillary: 143 mg/dL — ABNORMAL HIGH (ref 65–99)
Glucose-Capillary: 178 mg/dL — ABNORMAL HIGH (ref 65–99)
Glucose-Capillary: 77 mg/dL (ref 65–99)
Glucose-Capillary: 93 mg/dL (ref 65–99)

## 2015-08-08 LAB — BLOOD GAS, ARTERIAL
Acid-base deficit: 7 mmol/L — ABNORMAL HIGH (ref 0.0–2.0)
Bicarbonate: 17.1 mEq/L — ABNORMAL LOW (ref 20.0–24.0)
DRAWN BY: 24487
FIO2: 0.4
LHR: 16 {breaths}/min
MECHVT: 400 mL
O2 Saturation: 98.8 %
PATIENT TEMPERATURE: 98.6
PCO2 ART: 29.7 mmHg — AB (ref 35.0–45.0)
PEEP: 5 cmH2O
PO2 ART: 133 mmHg — AB (ref 80.0–100.0)
TCO2: 18.1 mmol/L (ref 0–100)
pH, Arterial: 7.379 (ref 7.350–7.450)

## 2015-08-08 LAB — CULTURE, RESPIRATORY: CULTURE: NORMAL

## 2015-08-08 LAB — MAGNESIUM: MAGNESIUM: 2 mg/dL (ref 1.7–2.4)

## 2015-08-08 LAB — PROTIME-INR
INR: 9.4 (ref 0.00–1.49)
INR: 1.53 — AB (ref 0.00–1.49)
PROTHROMBIN TIME: 72.2 s — AB (ref 11.6–15.2)
PROTHROMBIN TIME: 18.4 s — AB (ref 11.6–15.2)

## 2015-08-08 MED ORDER — VITAMIN K1 10 MG/ML IJ SOLN
5.0000 mg | Freq: Once | INTRAMUSCULAR | Status: AC
  Administered 2015-08-08: 5 mg via INTRAVENOUS
  Filled 2015-08-08: qty 0.5

## 2015-08-08 MED ORDER — SODIUM CHLORIDE 0.9 % IV SOLN
Freq: Once | INTRAVENOUS | Status: AC
  Administered 2015-08-08: 12:00:00 via INTRAVENOUS

## 2015-08-08 NOTE — Progress Notes (Signed)
Patient Name: Diane [name]      SUBJECTIVE:*  Have spent about 1 hr tryingto understand EMS findings 1) asystole on arrival 2) sinus bradycardia and repeated episodes of sinus pauses during resuscition  Tel today afib.flutter last pm  Notably with IMPROVED BP, but mostly assoc with JUNCTIONAL RHTYHM  Past Medical History  Diagnosis Date  . Shortness of breath 05/29/2015  . Severe mitral regurgitation 07/03/2015  . Chronic combined systolic and diastolic CHF (congestive heart failure) (HCC) 07/03/2015    Grade 3 diastolic dysfunction.  LVEF 40-45% 05/2015.  Marland Kitchen Aortic aneurysm (HCC)   . Patent ductus arteriosus 07/16/2015  . Depression     SOME DEPRESSION (ON MEDS FOR 2 YRS) WHEN HER MOTHER PASSED  . 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 07/22/2015    26 mm Sorin Memo 3D ring annuloplasty with ligation of patent ductus arteriosus and clipping of LA appendage    Scheduled Meds:  Scheduled Meds: . ampicillin-sulbactam (UNASYN) IV  1.5 g Intravenous Q6H  . antiseptic oral rinse  7 mL Mouth Rinse 10 times per day  . chlorhexidine gluconate  15 mL Mouth Rinse BID  . insulin aspart  0-9 Units Subcutaneous 6 times per day  . pantoprazole (PROTONIX) IV  40 mg Intravenous QHS  . sodium chloride  10-40 mL Intracatheter Q12H   Continuous Infusions: . amiodarone 30 mg/hr (08/08/15 0302)  . norepinephrine (LEVOPHED) Adult infusion 1 mcg/min (08/08/15 0231)  . phenylephrine (NEO-SYNEPHRINE) Adult infusion 400 mcg/min (08/08/15 0722)   sodium chloride, sodium chloride, sodium chloride    PHYSICAL EXAM Filed Vitals:   08/08/15 0300 08/08/15 0400 08/08/15 0500 08/08/15 0600  BP:    103/63  Pulse: 63 64 62 63  Temp:  99 F (37.2 C)  99.1 F (37.3 C)  TempSrc:  Core (Comment)  Core (Comment)  Resp: 16 16 16 16   Height:      Weight:      SpO2: 100% 100% 100% 100%   intubeated Awakens and tracks to voice Good air  movement RRR -edema  TELEMETRY: Reviewed telemetry pt in  As above :    Intake/Output Summary (Last 24 hours) at 08/08/15 0822 Last data filed at 08/08/15 0600  Gross per 24 hour  Intake 3046.19 ml  Output    290 ml  Net 2756.19 ml    LABS: Basic Metabolic Panel:  Recent Labs Lab 08/05/15 2006  08/05/15 2351 08/06/15 0200 08/06/15 0400 08/06/15 0634  08/06/15 2222 08/07/15 0223 08/07/15 0610 08/07/15 0751 08/07/15 0755 08/07/15 1024 08/07/15 1437 08/07/15 2050 08/08/15 0410  NA 127*  < > 126* 127* 130* 129*  < > 133* 133* 133* 133*  --  131* 132*  --  126*  K 3.4*  < > 3.0* 3.5 4.3 4.2  < > 3.6 3.8 3.7 4.2  --  4.1 4.5  --  4.5  CL 92*  < > 90* 92* 95* 99*  < > 97* 97* 98* 100*  --  102 99*  --  94*  CO2 22  --  22 22 24 23   --   --   --   --  19*  --   --   --   --  21*  GLUCOSE 211*  < > 283* 282* 239* 163*  < > 94 94 107* 106*  --  107* 110*  --  203*  BUN 15  < > 14 15 13 11   < >  8 7 8 9   --  9 10  --  9  CREATININE 0.74  < > 0.65 0.73 0.55 0.52  < > 0.30* 0.30* 0.40* 0.64  --  0.40* 0.50  --  0.91  CALCIUM 7.7*  --  7.9* 8.1* 8.3* 8.1*  --   --   --   --  8.2*  --   --   --   --  7.9*  MG  --   < >  --   --  1.9  --   --   --   --   --   --  2.1  --   --  2.2 2.0  PHOS  --   --   --   --  1.4*  --   --   --   --   --   --  3.6  --   --   --   --   < > = values in this interval not displayed. Cardiac Enzymes:  Recent Labs  08/06/15 0200 08/06/15 0400 08/06/15 0845  TROPONINI 0.52* 0.58* 0.65*   CBC:  Recent Labs Lab 08/05/15 2006  08/06/15 0400  08/06/15 2222 08/07/15 0223 08/07/15 0610 08/07/15 0751 08/07/15 1024 08/07/15 1437 08/08/15 0410  WBC 33.9*  --  39.1*  --   --   --   --  19.7*  --   --  12.8*  NEUTROABS 28.8*  --   --   --   --   --   --  16.9*  --   --  10.7*  HGB 9.2*  < > 11.1*  < > 11.9* 12.6 13.3 11.6* 12.6 13.6 10.4*  HCT 28.8*  < > 31.7*  < > 35.0* 37.0 39.0 33.4* 37.0 40.0 31.0*  MCV 97.6  --  92.4  --   --   --   --   92.8  --   --  94.2  PLT 354  --  394  --   --   --   --  321  --   --  319  < > = values in this interval not displayed. PROTIME:  Recent Labs  08/06/15 0634 08/07/15 0755 08/08/15 0410  LABPROT 27.0* 40.2* 72.2*  INR 2.54* 4.31* 9.40*   Liver Function Tests:  Recent Labs  08/05/15 2006 08/07/15 0923  AST 38 31  ALT 33 30  ALKPHOS 76 97  BILITOT 0.8 1.4*  PROT 5.1* 5.3*  ALBUMIN 2.6* 2.4*   No results for input(s): LIPASE, AMYLASE in the last 72 hours. BNP: BNP (last 3 results)  Recent Labs  11/21/14 2324  BNP 196.6*    ProBNP (last 3 results) No results for input(s): PROBNP in the last 8760 hours.  D-Dimer: No results for input(s): DDIMER in the last 72 hours. Hemoglobin A1C: No results for input(s): HGBA1C in the last 72 hours. Fasting Lipid Panel: No results for input(s): CHOL, HDL, LDLCALC, TRIG, CHOLHDL, LDLDIRECT in the last 72 hours. Thyroid Function Tests:  Recent Labs  08/05/15 2135  TSH 2.455      ASSESSMENT AND PLAN:  Principal Problem:   Cardiac arrest [facility]) Active Problems:   Severe mitral regurgitation   Patent ductus arteriosus   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   Long term (current) use of anticoagulants [Z79.01]   Respiratory arrest (HCC)   Acute respiratory failure with hypoxia (HCC)   Anoxic encephalopathy (HCC)   Cardiogenic shock (HCC)  Arrest appears to be asystolic--this might also explain antecendednt syncope  In this light the QT prolongation is likely related to her valvular cardiomyopathy and not primary  With junctional rhythm, there is not significant hemodynamic benefit for NOT BEING IN AFL/FIB, as in neither caese is there atrial contribution and her ability to augment CO will be by RATE ALONE  Furhter, junctional rhythm can be assoc with pacemaker like syndrome which can in itself be worse than atrial fib because atrial contraction on colsed AV valve results in release of  vasomediators promoting Hypotension  Hence would stop amio And perhaps rearrange pressors  Will continue to follow  No family   Thanks    Signed, Sherryl Manges MD  08/08/2015

## 2015-08-08 NOTE — Progress Notes (Signed)
CRITICAL VALUE ALERT  Critical value received:  PT 72.2; INR 9.40  Date of notification:  08/08/2015  Time of notification:  05:20  Critical value read back:Yes.    Nurse who received alert:  Hillery Jacks, RN  MD notified (1st page):  Dr. Darrick Penna  Time of first page:  05:20  Responding MD:  Dr. Darrick Penna  Time MD responded:  05:20

## 2015-08-08 NOTE — Progress Notes (Signed)
Appreciate EP assistance. I agree with holding amiodarone or rate control agents. She is more awake today, which is promising. Main issues long-term have to do with arrhyhtmia. EP will follow and general cardiology will sign-off.  Chrystie Nose, MD, [facility] Attending Cardiologist [facility]

## 2015-08-08 NOTE — Procedures (Signed)
ELECTROENCEPHALOGRAM REPORT  Date of Study: 08/08/2015  Patient's Name: Diane [name] MRN: [id number] Date of Birth: July 04, 1961  Referring Provider: Dr. Moss Mc  Clinical History: This is a 55 year old woman s/p cardiac arrest and hypothermia protocol. Now normothermic and off sedation.  Medications: amiodarone (NEXTERONE PREMIX) 360 MG/200ML (1.8 mg/mL) IV infusion ampicillin-sulbactam (UNASYN) 1.5 g in sodium chloride 0.9 % 50 mL IVPB pantoprazole (PROTONIX) injection 40 mg phenylephrine (NEO-SYNEPHRINE) 40 mg in dextrose 5 % 250 mL (0.16 mg/mL) infusion  Technical Summary: A multichannel digital EEG recording measured by the international 10-20 system with electrodes applied with paste and impedances below 5000 ohms performed as portable with EKG monitoring in an intubated and unresponsive patient.  Hyperventilation and photic stimulation were not performed.  The digital EEG was referentially recorded, reformatted, and digitally filtered in a variety of bipolar and referential montages for optimal display.   Description: The patient is intubated and unresponsive during the recording. There is no clear posterior dominant rhythm. The background consists of a moderate amount of diffuse 4-7 Hz theta and 2-3 Hz delta slowing, with sleep architecture noted at times (vertex waves and poorly formed sleep spindles). With noxious stimulation, there is an increase in faster frequencies. Hyperventilation and photic stimulation were not performed.  There were no epileptiform discharges or electrographic seizures seen.    EKG lead was unremarkable.  Impression: This EEG is abnormal due to mild to moderate diffuse slowing of the background.  Clinical Correlation of the above findings indicates diffuse cerebral dysfunction that is non-specific in etiology and can be seen with hypoxic/ischemic injury, toxic/metabolic encephalopathies, or medication effect. There were no electrographic seizures  in this study.    Patrcia Dolly, M.D.

## 2015-08-08 NOTE — Progress Notes (Signed)
Pharmacy Antibiotic Follow-up Note  Diane [name] is a 55 y.o. year-old female admitted on 08/05/2015.  The patient is currently on day 4 of Unasyn for possible asp PNA.  Assessment/Plan: WBC improving 12.8, afeb, Plan to Continue Unasyn 1.5 Q6h given patient clinical picture. Will re-evealuate continuation of abx at later date.   Temp (24hrs), Avg:98.2 F (36.8 C), Min:96.6 F (35.9 C), Max:99.1 F (37.3 C)   Recent Labs Lab 08/05/15 2006 08/06/15 0400 08/07/15 0751 08/08/15 0410  WBC 33.9* 39.1* 19.7* 12.8*    Recent Labs Lab 08/07/15 0610 08/07/15 0751 08/07/15 1024 08/07/15 1437 08/08/15 0410  CREATININE 0.40* 0.64 0.40* 0.50 0.91   Estimated Creatinine Clearance: 59.9 mL/min (by C-G formula based on Cr of 0.91).    Allergies  Allergen Reactions  . Vicodin [Hydrocodone-Acetaminophen] Other (See Comments)    "she feels out of it", hallucinating  . Adhesive [Tape] Itching and Rash  . Silicone Itching and Rash    TAPE ALLERGY/EKG STICKER ALLERGY, Please use "paper" tape only    Antimicrobials this admission: Unasyn 1/17 >>  Levels/dose changes this admission: N/A  Microbiology results: 1/18: BCx >> ngtd 1/18 UCx >> ng final 1/18 trach asp >> normal flora final MRSA PCR neg  Thank you for allowing pharmacy to be a part of this patient's care.  Remi Haggard PharmD 08/08/2015 10:55 AM

## 2015-08-08 NOTE — Consult Note (Signed)
NEURO HOSPITALIST CONSULT NOTE   Referring physician: Dr Lake Bells Reason for Consult: prognostication s/p cardiac arrrest  HPI:                                                                                                                  Patient is intubated on the vent and thus all clinical information was obtained from the medical record. " 55 y/o with severe mitral regurgitaion s/p mitral valve repair on 07/22/2015, NICM, chronic combined systolic/diastolic CHF (EF 81-19% from TEE 07/07/2015 who presented to [facility] ED after cardiac arrest. She has recently been evaluated by Cardiology for increasing shortness of breath and syncopal episodes. Workup was revealing for severe mitral regurgitation. Coronary angiography on 07/08/2015 for pre-op evaluation showed angiographically normal coronary arteries. Patient underwent mitral valve repair with annuloplasty an ligation of PDA on 07/22/2015. She had post-op heart block which stabilized prior to discharge from hospital. She was started on Coumadin with INR goal of 2.0-3.0. Last INR on 1/12 was 1.9. On her follow up out patient visit she was noted to have some increased SOB, lower extremity swelling, and several pounds increase in weight. Patient's daughter reports that since her surgery, she has been feeling more tired than usual, taking several naps throughout the day. Today, daughter states that patient was having a good day and proud of herself that she did not need to take a nap. Her only complaint was some noted hematuria. She had dinner around 5 pm and was able to eat without issue. About 1 hour later, daughter reports that her mother suddenly fell to the floor without warning. CPR was begun until EMS arrived. She was in PEA and CPR was continued for a total of about 7 minutes until ROSC. Pulse was lost en route to the ED and CPR was restarted. Intubation was attempted en route and patient vomited with likely aspiration. Patient  was noted to be in [place] on 2nd round of CPR which lasted for 7-8 minutes. Total pulseless time was approximately 15 minutes. Patient was unresponsive and intubated in the ED and started on Epi drip and hypothermic protocol". Hospital course complicated by INR increasing, now 9.4 on NO anticoagulation, aspiration pneumonia vs pulmonary edema, labile BP requiring levo and Neo, improving Non-Anion Gap Metabolic Acidosis . CT head obtained 1/19 was personally reviewed and showed no acute abnormality. EEG without electrographic seizures but findings consistent with global cerebral dysfunction. Significant labs reviewed: wbc 12.8, Na 126, BUN 203, INR 9.40,  Patient nurse reports that patient has been off sedation for 24 hours, was completely rewarmed yesterday, and open eyes to voice and noxious stimuli.   Past Medical History  Diagnosis Date  . Shortness of breath 05/29/2015  . Severe mitral regurgitation 07/03/2015  . Chronic combined systolic and diastolic CHF (congestive heart failure) ([place]) 07/03/2015    Grade 3 diastolic dysfunction.  LVEF 40-45% 05/2015.  Marland Kitchen  Aortic aneurysm ([place])   . Patent ductus arteriosus 07/16/2015  . Depression     SOME DEPRESSION (ON MEDS FOR 2 YRS) WHEN HER MOTHER PASSED  . 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 07/22/2015    26 mm Sorin Memo 3D ring annuloplasty with ligation of patent ductus arteriosus and clipping of LA appendage    Past Surgical History  Procedure Laterality Date  . Ovarian cyst removal    . Tee without cardioversion N/A 07/07/2015    Procedure: TRANSESOPHAGEAL ECHOCARDIOGRAM (TEE);  Surgeon: Skeet Latch, MD;  Location: [place];  Service: Cardiovascular;  Laterality: N/A;  . Cardiac catheterization N/A 07/08/2015    Procedure: Right/Left Heart Cath and Coronary Angiography;  Surgeon: Leonie Man, MD;  Location: [facility];  Service: Cardiovascular;  Laterality: N/A;  . Mitral valve repair N/A  07/22/2015    Procedure: MITRAL VALVE REPAIR;  Surgeon: Rexene Alberts, MD;  Location: [place];  Service: Open Heart Surgery;  Laterality: N/A;  26 Sorin Memo 3D  . Tee without cardioversion N/A 07/22/2015    Procedure: TRANSESOPHAGEAL ECHOCARDIOGRAM (TEE);  Surgeon: Rexene Alberts, MD;  Location: [place];  Service: Open Heart Surgery;  Laterality: N/A;  . Patent ductus arterious repair N/A 07/22/2015    Procedure: PATENT DUCTUS ARTERIOSUS (PDA) ligation;  Surgeon: Rexene Alberts, MD;  Location: [place];  Service: Open Heart Surgery;  Laterality: N/A;  . Clipping of atrial appendage  07/22/2015    Procedure: CLIPPING OF LEFT ATRIAL APPENDAGE;  Surgeon: Rexene Alberts, MD;  Location: [place];  Service: Open Heart Surgery;;  [address] [place] [address]    No family history on file.  Family History: unable to obtain due to mental status   Social History:  reports that she has never smoked. She has never used smokeless tobacco. She reports that she does not drink alcohol or use illicit drugs.  Allergies  Allergen Reactions  . Vicodin [Hydrocodone-Acetaminophen] Other (See Comments)    "she feels out of it", hallucinating  . Adhesive [Tape] Itching and Rash  . Silicone Itching and Rash    TAPE ALLERGY/EKG STICKER ALLERGY, Please use "paper" tape only    MEDICATIONS:                                                                                                                     Scheduled: . sodium chloride   Intravenous Once  . ampicillin-sulbactam (UNASYN) IV  1.5 g Intravenous Q6H  . antiseptic oral rinse  7 mL Mouth Rinse 10 times per day  . chlorhexidine gluconate  15 mL Mouth Rinse BID  . insulin aspart  0-9 Units Subcutaneous 6 times per day  . pantoprazole (PROTONIX) IV  40 mg Intravenous QHS  . sodium chloride  10-40 mL Intracatheter Q12H     ROS: unobtainable due to mental status  History obtained from chart review     Physical exam:  Constitutional: critically ill female intubated on the vent. Blood pressure 98/68, pulse 121, temperature 97.7 F (36.5 C), temperature source Core (Comment), resp. rate 16, height _0  (1.575 m), weight 62.33 kg (137 lb 6.6 oz), SpO2 100 %. Eyes: no jaundice or exophthalmos.  Head: normocephalic. Neck: supple, no bruits, no JVD. Cardiac: no murmurs. Lungs: clear. Abdomen: arctic sun pads in place.  Extremities: no edema, clubbing, or cyanosis.  Skin: no rash  Neurologic Examination:                                                                                                      Mental status: off sedation > 24 hours, open eyes to verbal commands but is not able to follow commands. CN 2-12: pupils 4 mm, good reaction to light. Oculocephalic and corneal reflexes intact. Gag reflex present. Tongue: intubated. Motor: no spontaneous movements noted during this exam. Sensory: withdraws to pain but does not localize. DTR's: unable to elicit. Plantars: mute Coordination and gait: unable to test due to mental status  No results found for: CHOL  Results for orders placed or performed during the hospital encounter of 08/05/15 (from the past 48 hour(s))  Glucose, capillary     Status: None   Collection Time: 08/06/15  4:18 PM  Result Value Ref Range   Glucose-Capillary 89 65 - 99 mg/dL   Comment 1 Notify RN   I-STAT, chem 8     Status: Abnormal   Collection Time: 08/06/15  6:03 PM  Result Value Ref Range   Sodium 132 (L) 135 - 145 mmol/L   Potassium 4.2 3.5 - 5.1 mmol/L   Chloride 97 (L) 101 - 111 mmol/L   BUN 8 6 - 20 mg/dL   Creatinine, Ser 0.30 (L) 0.44 - 1.00 mg/dL   Glucose, Bld 97 65 - 99 mg/dL   Calcium, Ion 1.11 (L) 1.12 - 1.23 mmol/L   TCO2 22 0 - 100 mmol/L   Hemoglobin 11.6 (L) 12.0 - 15.0 g/dL   HCT 34.0 (L) 36.0 - 46.0 %  Glucose, capillary     Status: None   Collection Time: 08/06/15  8:28 PM   Result Value Ref Range   Glucose-Capillary 84 65 - 99 mg/dL   Comment 1 Arterial Specimen   I-STAT, chem 8     Status: Abnormal   Collection Time: 08/06/15 10:22 PM  Result Value Ref Range   Sodium 133 (L) 135 - 145 mmol/L   Potassium 3.6 3.5 - 5.1 mmol/L   Chloride 97 (L) 101 - 111 mmol/L   BUN 8 6 - 20 mg/dL   Creatinine, Ser 0.30 (L) 0.44 - 1.00 mg/dL   Glucose, Bld 94 65 - 99 mg/dL   Calcium, Ion 1.10 (L) 1.12 - 1.23 mmol/L   TCO2 20 0 - 100 mmol/L   Hemoglobin 11.9 (L) 12.0 - 15.0 g/dL   HCT 35.0 (L) 36.0 - 46.0 %  Glucose, capillary     Status: None   Collection Time: 08/07/15 12:15 AM  Result Value Ref Range   Glucose-Capillary 85 65 - 99 mg/dL  I-STAT, chem 8     Status: Abnormal   Collection Time: 08/07/15  2:23 AM  Result Value Ref Range   Sodium 133 (L) 135 - 145 mmol/L   Potassium 3.8 3.5 - 5.1 mmol/L   Chloride 97 (L) 101 - 111 mmol/L   BUN 7 6 - 20 mg/dL   Creatinine, Ser 0.30 (L) 0.44 - 1.00 mg/dL   Glucose, Bld 94 65 - 99 mg/dL   Calcium, Ion 1.10 (L) 1.12 - 1.23 mmol/L   TCO2 20 0 - 100 mmol/L   Hemoglobin 12.6 12.0 - 15.0 g/dL   HCT 37.0 36.0 - 46.0 %  Glucose, capillary     Status: None   Collection Time: 08/07/15  4:37 AM  Result Value Ref Range   Glucose-Capillary 97 65 - 99 mg/dL   Comment 1 Arterial Specimen   I-STAT, chem 8     Status: Abnormal   Collection Time: 08/07/15  6:10 AM  Result Value Ref Range   Sodium 133 (L) 135 - 145 mmol/L   Potassium 3.7 3.5 - 5.1 mmol/L   Chloride 98 (L) 101 - 111 mmol/L   BUN 8 6 - 20 mg/dL   Creatinine, Ser 0.40 (L) 0.44 - 1.00 mg/dL   Glucose, Bld 107 (H) 65 - 99 mg/dL   Calcium, Ion 1.09 (L) 1.12 - 1.23 mmol/L   TCO2 19 0 - 100 mmol/L   Hemoglobin 13.3 12.0 - 15.0 g/dL   HCT 39.0 36.0 - 46.0 %  CBC with Differential/Platelet     Status: Abnormal   Collection Time: 08/07/15  7:51 AM  Result Value Ref Range   WBC 19.7 (H) 4.0 - 10.5 K/uL   RBC 3.60 (L) 3.87 - 5.11 MIL/uL   Hemoglobin 11.6 (L) 12.0 -  15.0 g/dL   HCT 33.4 (L) 36.0 - 46.0 %   MCV 92.8 78.0 - 100.0 fL   MCH 32.2 26.0 - 34.0 pg   MCHC 34.7 30.0 - 36.0 g/dL   RDW 15.2 11.5 - 15.5 %   Platelets 321 150 - 400 K/uL   Neutrophils Relative % 86 %   Neutro Abs 16.9 (H) 1.7 - 7.7 K/uL   Lymphocytes Relative 8 %   Lymphs Abs 1.5 0.7 - 4.0 K/uL   Monocytes Relative 6 %   Monocytes Absolute 1.2 (H) 0.1 - 1.0 K/uL   Eosinophils Relative 0 %   Eosinophils Absolute 0.0 0.0 - 0.7 K/uL   Basophils Relative 0 %   Basophils Absolute 0.0 0.0 - 0.1 K/uL  Basic metabolic panel     Status: Abnormal   Collection Time: 08/07/15  7:51 AM  Result Value Ref Range   Sodium 133 (L) 135 - 145 mmol/L   Potassium 4.2 3.5 - 5.1 mmol/L   Chloride 100 (L) 101 - 111 mmol/L   CO2 19 (L) 22 - 32 mmol/L   Glucose, Bld 106 (H) 65 - 99 mg/dL   BUN 9 6 - 20 mg/dL   Creatinine, Ser 0.64 0.44 - 1.00 mg/dL   Calcium 8.2 (L) 8.9 - 10.3 mg/dL   GFR calc non Af Amer >60 >60 mL/min   GFR calc Af Amer >60 >60 mL/min    Comment: (NOTE) The eGFR has been calculated using the CKD EPI equation. This calculation has not been validated in all clinical situations. eGFR's persistently <60 mL/min signify possible Chronic Kidney Disease.    Anion gap  14 5 - 15  Protime-INR     Status: Abnormal   Collection Time: 08/07/15  7:55 AM  Result Value Ref Range   Prothrombin Time 40.2 (H) 11.6 - 15.2 seconds   INR 4.31 (H) 0.00 - 1.49  Magnesium     Status: None   Collection Time: 08/07/15  7:55 AM  Result Value Ref Range   Magnesium 2.1 1.7 - 2.4 mg/dL  Phosphorus     Status: None   Collection Time: 08/07/15  7:55 AM  Result Value Ref Range   Phosphorus 3.6 2.5 - 4.6 mg/dL  Glucose, capillary     Status: None   Collection Time: 08/07/15  8:31 AM  Result Value Ref Range   Glucose-Capillary 97 65 - 99 mg/dL  Hepatic function panel     Status: Abnormal   Collection Time: 08/07/15  9:23 AM  Result Value Ref Range   Total Protein 5.3 (L) 6.5 - 8.1 g/dL   Albumin  2.4 (L) 3.5 - 5.0 g/dL   AST 31 15 - 41 U/L   ALT 30 14 - 54 U/L   Alkaline Phosphatase 97 38 - 126 U/L   Total Bilirubin 1.4 (H) 0.3 - 1.2 mg/dL   Bilirubin, Direct 0.2 0.1 - 0.5 mg/dL   Indirect Bilirubin 1.2 (H) 0.3 - 0.9 mg/dL  I-STAT, chem 8     Status: Abnormal   Collection Time: 08/07/15 10:24 AM  Result Value Ref Range   Sodium 131 (L) 135 - 145 mmol/L   Potassium 4.1 3.5 - 5.1 mmol/L   Chloride 102 101 - 111 mmol/L   BUN 9 6 - 20 mg/dL   Creatinine, Ser 0.40 (L) 0.44 - 1.00 mg/dL   Glucose, Bld 107 (H) 65 - 99 mg/dL   Calcium, Ion 1.07 (L) 1.12 - 1.23 mmol/L   TCO2 18 0 - 100 mmol/L   Hemoglobin 12.6 12.0 - 15.0 g/dL   HCT 37.0 36.0 - 46.0 %  I-STAT 3, arterial blood gas (G3+)     Status: Abnormal   Collection Time: 08/07/15 11:32 AM  Result Value Ref Range   pH, Arterial 7.444 7.350 - 7.450   pCO2 arterial 26.6 (L) 35.0 - 45.0 mmHg   pO2, Arterial 122.0 (H) 80.0 - 100.0 mmHg   Bicarbonate 18.4 (L) 20.0 - 24.0 mEq/L   TCO2 19 0 - 100 mmol/L   O2 Saturation 99.0 %   Acid-base deficit 5.0 (H) 0.0 - 2.0 mmol/L   Patient temperature 36.1 C    Collection site RADIAL, ALLEN'S TEST ACCEPTABLE    Drawn by VP    Sample type ARTERIAL   Glucose, capillary     Status: None   Collection Time: 08/07/15 12:13 PM  Result Value Ref Range   Glucose-Capillary 91 65 - 99 mg/dL   Comment 1 Arterial Specimen   I-STAT, chem 8     Status: Abnormal   Collection Time: 08/07/15  2:37 PM  Result Value Ref Range   Sodium 132 (L) 135 - 145 mmol/L   Potassium 4.5 3.5 - 5.1 mmol/L   Chloride 99 (L) 101 - 111 mmol/L   BUN 10 6 - 20 mg/dL   Creatinine, Ser 0.50 0.44 - 1.00 mg/dL   Glucose, Bld 110 (H) 65 - 99 mg/dL   Calcium, Ion 1.12 1.12 - 1.23 mmol/L   TCO2 20 0 - 100 mmol/L   Hemoglobin 13.6 12.0 - 15.0 g/dL   HCT 40.0 36.0 - 46.0 %  Glucose, capillary  Status: Abnormal   Collection Time: 08/07/15  4:18 PM  Result Value Ref Range   Glucose-Capillary 151 (H) 65 - 99 mg/dL    Comment 1 Arterial Specimen   Glucose, capillary     Status: Abnormal   Collection Time: 08/07/15  7:59 PM  Result Value Ref Range   Glucose-Capillary 172 (H) 65 - 99 mg/dL   Comment 1 Venous Specimen   Magnesium     Status: None   Collection Time: 08/07/15  8:50 PM  Result Value Ref Range   Magnesium 2.2 1.7 - 2.4 mg/dL  Glucose, capillary     Status: Abnormal   Collection Time: 08/07/15 11:58 PM  Result Value Ref Range   Glucose-Capillary 178 (H) 65 - 99 mg/dL   Comment 1 Arterial Specimen   Glucose, capillary     Status: Abnormal   Collection Time: 08/08/15  4:00 AM  Result Value Ref Range   Glucose-Capillary 174 (H) 65 - 99 mg/dL   Comment 1 Venous Specimen   Blood gas, arterial     Status: Abnormal   Collection Time: 08/08/15  4:09 AM  Result Value Ref Range   FIO2 0.40    Delivery systems VENTILATOR    Mode PRESSURE REGULATED VOLUME CONTROL    VT 400 mL   LHR 16 resp/min   Peep/cpap 5.0 cm H20   pH, Arterial 7.379 7.350 - 7.450   pCO2 arterial 29.7 (L) 35.0 - 45.0 mmHg   pO2, Arterial 133 (H) 80.0 - 100.0 mmHg   Bicarbonate 17.1 (L) 20.0 - 24.0 mEq/L   TCO2 18.1 0 - 100 mmol/L   Acid-base deficit 7.0 (H) 0.0 - 2.0 mmol/L   O2 Saturation 98.8 %   Patient temperature 98.6    Collection site ARTERIAL LINE    Drawn by [id number]    Sample type ARTERIAL    Allens test (pass/fail) PASS PASS  CBC with Differential/Platelet     Status: Abnormal   Collection Time: 08/08/15  4:10 AM  Result Value Ref Range   WBC 12.8 (H) 4.0 - 10.5 K/uL   RBC 3.29 (L) 3.87 - 5.11 MIL/uL   Hemoglobin 10.4 (L) 12.0 - 15.0 g/dL    Comment: DELTA CHECK NOTED REPEATED TO VERIFY    HCT 31.0 (L) 36.0 - 46.0 %   MCV 94.2 78.0 - 100.0 fL   MCH 31.6 26.0 - 34.0 pg   MCHC 33.5 30.0 - 36.0 g/dL   RDW 15.4 11.5 - 15.5 %   Platelets 319 150 - 400 K/uL   Neutrophils Relative % 84 %   Neutro Abs 10.7 (H) 1.7 - 7.7 K/uL   Lymphocytes Relative 8 %   Lymphs Abs 1.1 0.7 - 4.0 K/uL   Monocytes Relative  8 %   Monocytes Absolute 1.0 0.1 - 1.0 K/uL   Eosinophils Relative 0 %   Eosinophils Absolute 0.0 0.0 - 0.7 K/uL   Basophils Relative 0 %   Basophils Absolute 0.0 0.0 - 0.1 K/uL  Basic metabolic panel     Status: Abnormal   Collection Time: 08/08/15  4:10 AM  Result Value Ref Range   Sodium 126 (L) 135 - 145 mmol/L   Potassium 4.5 3.5 - 5.1 mmol/L   Chloride 94 (L) 101 - 111 mmol/L   CO2 21 (L) 22 - 32 mmol/L   Glucose, Bld 203 (H) 65 - 99 mg/dL   BUN 9 6 - 20 mg/dL   Creatinine, Ser 0.91 0.44 - 1.00 mg/dL  Calcium 7.9 (L) 8.9 - 10.3 mg/dL   GFR calc non Af Amer >60 >60 mL/min   GFR calc Af Amer >60 >60 mL/min    Comment: (NOTE) The eGFR has been calculated using the CKD EPI equation. This calculation has not been validated in all clinical situations. eGFR's persistently <60 mL/min signify possible Chronic Kidney Disease.    Anion gap 11 5 - 15  Magnesium     Status: None   Collection Time: 08/08/15  4:10 AM  Result Value Ref Range   Magnesium 2.0 1.7 - 2.4 mg/dL  Protime-INR     Status: Abnormal   Collection Time: 08/08/15  4:10 AM  Result Value Ref Range   Prothrombin Time 72.2 (H) 11.6 - 15.2 seconds   INR 9.40 (HH) 0.00 - 1.49    Comment: REPEATED TO VERIFY CRITICAL RESULT CALLED TO, READ BACK BY AND VERIFIED WITH: REBECCA SHUMPER,RN AT 0518 08/08/15. [email or web address]   Prepare fresh frozen plasma     Status: None (Preliminary result)   Collection Time: 08/08/15 11:43 AM  Result Value Ref Range   Unit Number [id number]    Blood Component Type THWPLS APHR1    Unit division 00    Status of Unit ISSUED    Transfusion Status OK TO TRANSFUSE    Unit Number [id number]    Blood Component Type THAWED PLASMA    Unit division 00    Status of Unit ISSUED    Transfusion Status OK TO TRANSFUSE    Unit Number [id number]    Blood Component Type THAWED PLASMA    Unit division 00    Status of Unit ALLOCATED    Transfusion Status OK TO TRANSFUSE     Ct Head Wo  Contrast  08/07/2015  CLINICAL DATA:  Status post cardiac arrest. History of CHF, aortic aneurysm. EXAM: CT HEAD WITHOUT CONTRAST TECHNIQUE: Contiguous axial images were obtained from the base of the skull through the vertex without intravenous contrast. COMPARISON:  MRI of the brain January 14, 2015 FINDINGS: The ventricles and sulci are normal. No intraparenchymal hemorrhage, mass effect nor midline shift. No acute large vascular territory infarcts. No abnormal extra-axial fluid collections. Basal cisterns are patent. No skull fracture. Subcentimeter calcification LEFT periorbital soft tissues. The included ocular globes and orbital contents are non-suspicious. The mastoid aircells and included paranasal sinuses are well-aerated. IMPRESSION: Negative CT head. Electronically Signed   By: Elon Alas M.D.   On: 08/07/2015 23:36   Dg Chest Port 1 View  08/08/2015  CLINICAL DATA:  Cardiac arrest EXAM: PORTABLE CHEST 1 VIEW COMPARISON:  08/07/2015 FINDINGS: Endotracheal tube in good position. Central venous catheter tip in the SVC unchanged. NG tube in the stomach. Moderately large left effusion with left lower lobe airspace disease unchanged. Smaller right effusion with right lower lobe airspace disease unchanged. Diffuse bilateral airspace disease may represent edema and has progressed. IMPRESSION: Progressive bilateral airspace disease most likely due to edema. No change in bilateral pleural effusions left greater than right with compressive atelectasis in the bases. Endotracheal tube remains in good position. Electronically Signed   By: Franchot Gallo M.D.   On: 08/08/2015 08:06   Dg Chest Port 1 View  08/07/2015  CLINICAL DATA:  Heart failure. EXAM: PORTABLE CHEST 1 VIEW COMPARISON:  08/05/2015. FINDINGS: Endotracheal tube, NG tube, left IJ line in stable position. Prior cardiac valve replacement. Left atrial appendage clip noted. Partial clearing of bilateral pulmonary infiltrates/edema. Persistent but  slightly improved bilateral pleural effusions. No pneumothorax .  IMPRESSION: 1. Lines and tubes in stable position. 2. Partial clearing of bilateral pulmonary edema and pleural effusions. 3. Prior cardiac valve replacement.  Stable cardiomegaly. Electronically Signed   By: Marcello Moores  Register   On: 08/07/2015 07:32   Assessment/Plan: 55 y/o with post cardiac arrest anoxic encephalopathy, PEA episode with total pulseless time of 15 minute. She completely rewarmed 1/19 and has been off sedatives > 24 hours. No dramatic metabolic derangements at this moment.  Open eyes to verbal commands, has preserved brainstem function, CT head does not show acute structural damage, EEG demonstrates global cerebral dysfunction without evidence of electrographic seizures. Likewise, she completed rewarming yesterday, has reassuring neuro findings on exam, and thus at this time will be prudent to allow more time and then make a determination regarding prognostic and need for further testing. Will follow up   Dorian Pod, MD 08/08/2015, 2:21 PM

## 2015-08-08 NOTE — Progress Notes (Signed)
eLink Physician-Brief Progress Note Patient Name: Diane [name] DOB: September 09, 1960 MRN: [id number]   Date of Service  08/08/2015  HPI/Events of Note  Patient with increase in PT/INR now up to 72.2/9.4 .  ON no anticoagulation but on amiodarone.  No sign of active bleeding.  eICU Interventions  Plan: Continue to monitor PT/INR Vitamin K 5 mg IV now     Intervention Category Intermediate Interventions: Coagulopathy - evaluation and management  Elsie Baynes 08/08/2015, 5:24 AM

## 2015-08-08 NOTE — Progress Notes (Signed)
PULMONARY / CRITICAL CARE MEDICINE   Name: Diane [name] MRN: [id number] DOB: 06-18-1961    ADMISSION DATE:  08/05/2015   REFERRING MD:  ED  CHIEF COMPLAINT:  Cardiac Arrest   SUBJECTIVE:  Continued tachycardia, started on amiodarone overnight given continued increasing HR. INR increasing, now 9.4 on NO anticoagulation. Given 5 mg IV Vitamin K overnight. Had CT head yesterday PM, negative. Started back on Amio overnight for suspected atrial fib with RVR, now junctional rhythm.    VITAL SIGNS: BP 103/63 mmHg  Pulse 63  Temp(Src) 99.1 F (37.3 C) (Core (Comment))  Resp 16  Ht  (1.575 m)  Wt 137 lb 6.6 oz (62.33 kg)  BMI 25.13 kg/m2  SpO2 100%  HEMODYNAMICS: CVP:  [12 mmHg-18 mmHg] 18 mmHg  VENTILATOR SETTINGS: Vent Mode:  [-] PRVC FiO2 (%):  [40 %] 40 % Set Rate:  [16 bmp-18 bmp] 16 bmp Vt Set:  [400 mL] 400 mL PEEP:  [5 cmH20-8 cmH20] 5 cmH20 Plateau Pressure:  [19 cmH20-21 cmH20] 21 cmH20  INTAKE / OUTPUT: I/O last 3 completed shifts: In: 3859.8 [I.V.:3559.8; IV Piggyback:300] Out: 915 [Urine:915]  PHYSICAL EXAMINATION: General:  Intubated, sedated, opens eyes to voice.  Neuro:  RASS -1, pupils 2 mm, reactive. Opens eyes but does not withdraw to pain.  HEENT:  ETT/OGT in place. Moist mucus membranes.  Cardiovascular:  Mild bradycardia, no murmurs, gallops, rubs.  Lungs:  Air entry equal bilaterally. Coarse breath sounds. No wheezes.  Abdomen:  Arctic sun pads in place.  Musculoskeletal:  No edema. Faint distal pulses. Cool to the touch. Skin:  Pallor. Cool.   LABS:  BMET  Recent Labs Lab 08/06/15 0634  08/07/15 0751 08/07/15 1024 08/07/15 1437 08/08/15 0410  NA 129*  < > 133* 131* 132* 126*  K 4.2  < > 4.2 4.1 4.5 4.5  CL 99*  < > 100* 102 99* 94*  CO2 23  --  19*  --   --  21*  BUN 11  < > CREATININE 0.52  < > 0.64 0.40* 0.50 0.91  GLUCOSE 163*  < > 106* 107* 110* 203*  < > = values in this interval not displayed.    Electrolytes  Recent Labs Lab 08/06/15 0400 08/06/15 0634 08/07/15 0751 08/07/15 0755 08/07/15 2050 08/08/15 0410  CALCIUM 8.3* 8.1* 8.2*  --   --  7.9*  MG 1.9  --   --  2.1 2.2 2.0  PHOS 1.4*  --   --  3.6  --   --     CBC  Recent Labs Lab 08/06/15 0400  08/07/15 0751 08/07/15 1024 08/07/15 1437 08/08/15 0410  WBC 39.1*  --  19.7*  --   --  12.8*  HGB 11.1*  < > 11.6* 12.6 13.6 10.4*  HCT 31.7*  < > 33.4* 37.0 40.0 31.0*  PLT 394  --  321  --   --  319  < > = values in this interval not displayed.   Coag's  Recent Labs Lab 08/05/15 2351 08/06/15 0634 08/07/15 0755 08/08/15 0410  APTT 56* 38*  --   --   INR 2.71* 2.54* 4.31* 9.40*   Sepsis Markers  Recent Labs Lab 08/05/15 2007 08/05/15 2135  LATICACIDVEN 5.7* 5.4*   ABG  Recent Labs Lab 08/06/15 0345 08/07/15 1132 08/08/15 0409  PHART 7.496* 7.444 7.379  PCO2ART 27.8* 26.6* 29.7*  PO2ART 234* 122.0* 133*   Liver Enzymes  Recent Labs  Lab 08/05/15 2006 08/07/15 0923  AST 38 31  ALT 33 30  ALKPHOS 76 97  BILITOT 0.8 1.4*  ALBUMIN 2.6* 2.4*   Cardiac Enzymes  Recent Labs Lab 08/06/15 0200 08/06/15 0400 08/06/15 0845  TROPONINI 0.52* 0.58* 0.65*    Glucose  Recent Labs Lab 08/07/15 0831 08/07/15 1213 08/07/15 1618 08/07/15 1959 08/07/15 2358 08/08/15 0400  GLUCAP 97 91 151* 172* 178* 174*    Imaging Ct Head Wo Contrast  08/07/2015  CLINICAL DATA:  Status post cardiac arrest. History of CHF, aortic aneurysm. EXAM: CT HEAD WITHOUT CONTRAST TECHNIQUE: Contiguous axial images were obtained from the base of the skull through the vertex without intravenous contrast. COMPARISON:  MRI of the brain January 14, 2015 FINDINGS: The ventricles and sulci are normal. No intraparenchymal hemorrhage, mass effect nor midline shift. No acute large vascular territory infarcts. No abnormal extra-axial fluid collections. Basal cisterns are patent. No skull fracture. Subcentimeter calcification  LEFT periorbital soft tissues. The included ocular globes and orbital contents are non-suspicious. The mastoid aircells and included paranasal sinuses are well-aerated. IMPRESSION: Negative CT head. Electronically Signed   By: Awilda Metro M.D.   On: 08/07/2015 23:36     STUDIES:  1/17 ECHO >>> Normal LV systolic function, mild LVH, mild LAE, trace AI. S/p MV repair with mild MS, no MR. Small pericardial effusion, left pleural effusion. 1/17 CXR >>> Diffuse bilateral airspace opacification, aspiration vs edema 1/17 CTA chest >>> No CT evidence of pulmonary embolism. Moderate bilateral pleural effusions with complete consolidative changes of the lower lobes and partial consolidative changes of the left upper lobe. Small high attenuating fluid along the left aspect of the aortic arch may represent complex fluid or small amount of blood. 1/18 EEG >>> Consistent with a generalized non-specific cerebral dysfunction (encephalopathy). 1/19 CT head >>> Negative CT head.  CULTURES: Blood 1/18 >> Urine 1/18 >> Resp 1/18 >> GPC's  ANTIBIOTICS: Unasyn 1/17 >>   SIGNIFICANT EVENTS: 1/17 PEA arrest with ROSC, VT arrest with ROSC, intubated, hypothermia protocol 1/19 Rewarmed, paralytics, sedation turned off  LINES/TUBES: L IJ CVL 1/17 >> L Radial A-line 1/18 >> ETT 1/17 >>  DISCUSSION: 55 y.o. female w/ PMHx of severe MR s/p repair 07/22/15, NICM, chronic combined CHF, presented to [facility] ED after cardiac arrest.  ASSESSMENT / PLAN:  PULMONARY A: VDRF Aspiration Pneumonia vs Pulmonary Edema Large Bilateral Pleural Effusions L > R P:  Continue full vent support Daily SBT Repeat CXR in AM See ID Pending neurological recovery, will likely need thoracentesis given size of effusions  CARDIOVASCULAR A: S/p Cardiac arrest, PEA and VT (shock x3); thought to be related to primary arrhythmia Primary Arrhythmia? Severe MR s/p Repair on Coumadin Atrial Fibrillation s/p LAA Clipping RVR vs  Sinus Tach Labile Blood Pressure Chronic Combined CHF P:  Cardiology, EP following Neo gtt for MAP < 65, taper off Levo given tachycardia Hold Coumadin given supratherapeutic INR Stop amiodarone  RENAL A: Hyponatremia; worsening Improving Non-Anion Gap Metabolic Acidosis Normal Renal Function P:  Repeat BMP in AM Replace electrolytes as needed Check Mag, Phos in AM  GASTROINTESTINAL A: GI prophylaxis Nutrition P:  Protonix Start tube feeds today  HEMATOLOGIC A: Leukocytosis; improved On Chronic Coumadin Normocytic anemia DVT PPx Supratherapeutic INR; worsening. LFT's normal P:  Given 5 mg IV Vitamin K overnight Give 3 units FFP today Goal INR 2-3 Monitor closely for bleeding; Hb stable Repeat CBC, INR in AM SCD's  INFECTIOUS A: Likely Aspiration Pneumonia; GPC's in respiratory  culture P:  Continue Unasyn Follow cultures  ENDOCRINE A: Hyperglycemia P:  CBG's q4h ISS  NEUROLOGIC A: Likely Anoxic Brain Injury CT head normal  P:  EEG this AM Consult neurology for prognostication  Sedation off currently  Daily WUA Intermittent sedation for agitation   FAMILY  - Updates: Family updated 1/19  Lauris Chroman, MD PGY-3, Internal Medicine Pager: [phone]  Attending Note:  55 year old female s/p cardiac arrest with evidence of anoxia on exam today.  She opens eyes and withdraws to pain on exam but nothing to command.  CT of the head is normal.  EEG is pending.  Will continue support for now.  D/C all sedation.  F/U on EEG.  Family updated bedside, will give more time for brain recovery.  Will call neurology on consultation and transfuse FFP for INR of 9.4.  F/U INR ordered post transfusion.  Will f/u.  The patient is critically ill with multiple organ systems failure and requires high complexity decision making for assessment and support, frequent evaluation and titration of therapies, application of advanced monitoring technologies and  extensive interpretation of multiple databases.   Critical Care Time devoted to patient care services described in this note is  35  Minutes. This time reflects time of care of this signee Dr Koren Bound. This critical care time does not reflect procedure time, or teaching time or supervisory time of PA/NP/Med student/Med Resident etc but could involve care discussion time.  Alyson Reedy, M.D. [facility] Pulmonary/Critical Care Medicine. Pager: [phone]. After hours pager: [phone].

## 2015-08-08 NOTE — Progress Notes (Signed)
EEG Completed; Results Pending  

## 2015-08-09 ENCOUNTER — Inpatient Hospital Stay (HOSPITAL_COMMUNITY): Payer: BLUE CROSS/BLUE SHIELD

## 2015-08-09 LAB — GLUCOSE, CAPILLARY
GLUCOSE-CAPILLARY: 84 mg/dL (ref 65–99)
GLUCOSE-CAPILLARY: 81 mg/dL (ref 65–99)
GLUCOSE-CAPILLARY: 84 mg/dL (ref 65–99)
GLUCOSE-CAPILLARY: 96 mg/dL (ref 65–99)
Glucose-Capillary: 77 mg/dL (ref 65–99)
Glucose-Capillary: 74 mg/dL (ref 65–99)
Glucose-Capillary: 95 mg/dL (ref 65–99)

## 2015-08-09 LAB — CBC
HCT: 26 % — ABNORMAL LOW (ref 36.0–46.0)
HEMOGLOBIN: 9 g/dL — AB (ref 12.0–15.0)
MCH: 32 pg (ref 26.0–34.0)
MCHC: 34.6 g/dL (ref 30.0–36.0)
MCV: 92.5 fL (ref 78.0–100.0)
Platelets: 244 10*3/uL (ref 150–400)
RBC: 2.81 MIL/uL — AB (ref 3.87–5.11)
RDW: 15 % (ref 11.5–15.5)
WBC: 9.7 10*3/uL (ref 4.0–10.5)

## 2015-08-09 LAB — COMPREHENSIVE METABOLIC PANEL
ALBUMIN: 2.4 g/dL — AB (ref 3.5–5.0)
ALT: 551 U/L — ABNORMAL HIGH (ref 14–54)
ANION GAP: 9 (ref 5–15)
AST: 564 U/L — AB (ref 15–41)
Alkaline Phosphatase: 97 U/L (ref 38–126)
BILIRUBIN TOTAL: 1.5 mg/dL — AB (ref 0.3–1.2)
BUN: 7 mg/dL (ref 6–20)
CHLORIDE: 100 mmol/L — AB (ref 101–111)
CO2: 23 mmol/L (ref 22–32)
Calcium: 8.2 mg/dL — ABNORMAL LOW (ref 8.9–10.3)
Creatinine, Ser: 0.76 mg/dL (ref 0.44–1.00)
GFR calc Af Amer: >60 mL/min (ref >60)
GFR calc non Af Amer: >60 mL/min (ref >60)
GLUCOSE: 88 mg/dL (ref 65–99)
POTASSIUM: 3.9 mmol/L (ref 3.5–5.1)
SODIUM: 132 mmol/L — AB (ref 135–145)
TOTAL PROTEIN: 5.1 g/dL — AB (ref 6.5–8.1)

## 2015-08-09 LAB — PREPARE FRESH FROZEN PLASMA
UNIT DIVISION: 0
Unit division: 0
Unit division: 0

## 2015-08-09 LAB — BLOOD GAS, ARTERIAL
Acid-base deficit: 0.3 mmol/L (ref 0.0–2.0)
BICARBONATE: 22.6 meq/L (ref 20.0–24.0)
DRAWN BY: 252031
FIO2: 0.4
O2 SAT: 99.6 %
PATIENT TEMPERATURE: 98.4
PCO2 ART: 29.2 mmHg — AB (ref 35.0–45.0)
PEEP: 5 cmH2O
PH ART: 7.499 — AB (ref 7.350–7.450)
PO2 ART: 172 mmHg — AB (ref 80.0–100.0)
RATE: 16 resp/min
TCO2: 23.5 mmol/L (ref 0–100)
VT: 400 mL

## 2015-08-09 LAB — PROTIME-INR
INR: 1.41 (ref 0.00–1.49)
Prothrombin Time: 17.4 seconds — ABNORMAL HIGH (ref 11.6–15.2)

## 2015-08-09 LAB — MAGNESIUM: MAGNESIUM: 2.1 mg/dL (ref 1.7–2.4)

## 2015-08-09 LAB — PHOSPHORUS: Phosphorus: 3.1 mg/dL (ref 2.5–4.6)

## 2015-08-09 LAB — HEPARIN LEVEL (UNFRACTIONATED)

## 2015-08-09 MED ORDER — HEPARIN (PORCINE) IN NACL 100-0.45 UNIT/ML-% IJ SOLN
1150.0000 [IU]/h | INTRAMUSCULAR | Status: DC
  Administered 2015-08-09: 900 [IU]/h via INTRAVENOUS
  Filled 2015-08-09 (×2): qty 250

## 2015-08-09 MED ORDER — VITAL AF 1.2 CAL PO LIQD
1000.0000 mL | ORAL | Status: DC
  Administered 2015-08-09: 1000 mL

## 2015-08-09 MED ORDER — VITAL HIGH PROTEIN PO LIQD: 1000.0000 mL | ORAL | Status: DC

## 2015-08-09 NOTE — Progress Notes (Addendum)
ANTICOAGULATION CONSULT NOTE - Initial Consult  Pharmacy Consult for heparin Indication: hx MVR  Allergies  Allergen Reactions  . Vicodin [Hydrocodone-Acetaminophen] Other (See Comments)    "she feels out of it", hallucinating  . Adhesive [Tape] Itching and Rash  . Silicone Itching and Rash    TAPE ALLERGY/EKG STICKER ALLERGY, Please use "paper" tape only    Patient Measurements: Height: 5\' 2"  (157.5 cm) Weight: 136 lb 5 oz (61.83 kg) IBW/kg (Calculated) : 50.1 Heparin Dosing Weight: 61.8  Vital Signs: Temp: 98.5 F (36.9 C) (01/21 1217) Temp Source: Oral (01/21 1217) BP: 123/61 mmHg (01/21 1217) Pulse Rate: 82 (01/21 1217)  Labs:  Recent Labs  08/07/15 0751  08/07/15 1437 08/08/15 0410 08/08/15 1649 08/09/15 0430  HGB 11.6*  < > 13.6 10.4*  --  9.0*  HCT 33.4*  < > 40.0 31.0*  --  26.0*  PLT 321  --   --  319  --  244  LABPROT  --   < >  --  72.2* 18.4* 17.4*  INR  --   < >  --  9.40* 1.53* 1.41  CREATININE 0.64  < > 0.50 0.91  --  0.76  < > = values in this interval not displayed.  Estimated Creatinine Clearance: 67.9 mL/min (by C-G formula based on Cr of 0.76).   Medical History: Past Medical History  Diagnosis Date  . Shortness of breath 05/29/2015  . Severe mitral regurgitation 07/03/2015  . Chronic combined systolic and diastolic CHF (congestive heart failure) (HCC) 07/03/2015    Grade 3 diastolic dysfunction.  LVEF 40-45% 05/2015.  Marland Kitchen Aortic aneurysm (HCC)   . Patent ductus arteriosus 07/16/2015  . Depression     SOME DEPRESSION (ON MEDS FOR 2 YRS) WHEN HER MOTHER PASSED  . 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 ampicillin-sulbactam (UNASYN) IV  1.5 g Intravenous Q6H  . antiseptic oral rinse  7 mL Mouth Rinse 10 times per day  . chlorhexidine gluconate  15 mL Mouth Rinse  BID  . insulin aspart  0-9 Units Subcutaneous 6 times per day  . pantoprazole (PROTONIX) IV  40 mg Intravenous QHS  . sodium chloride  10-40 mL Intracatheter Q12H    Assessment: 55 yo woman admitted 08/05/2015 for PEA arrest and possible aspiration pna. Pt on warfarin PTA for MVR currently on hold. Pharmacy consulted to dose heparin.  PMH severe MR s/p MVR 07/2015, NICM (EF 50-55%), depression, AFib  Fluctuating INR during hospitalization up to 9.4, given vitamin K and FFP on 1/20. INR today 1.41. Hgb trending down to 9, plt wnl.   Goal of Therapy:  Heparin level 0.3-0.7 units/ml Monitor platelets by anticoagulation protocol: Yes   Plan:  Start heparin 900 units/h HL in 8h Daily HL/CBC Monitor s/sx bleeding   Hillery Aldo, Pharm.D. PGY2 Cardiology Pharmacy Resident Pager: [phone]  08/09/2015,12:51 PM

## 2015-08-09 NOTE — Progress Notes (Signed)
PULMONARY / CRITICAL CARE MEDICINE   Name: Diane [name] MRN: [id number] DOB: 27-Jul-1960    ADMISSION DATE:  08/05/2015   REFERRING MD:  ED  CHIEF COMPLAINT:  Cardiac Arrest   SUBJECTIVE:  Continued tachycardia, started on amiodarone overnight given continued increasing HR. INR increasing, now 9.4 on NO anticoagulation. Given 5 mg IV Vitamin K overnight. Had CT head yesterday PM, negative. Started back on Amio overnight for suspected atrial fib with RVR, now junctional rhythm.    VITAL SIGNS: BP 118/57 mmHg  Pulse 87  Temp(Src) 98.6 F (37 C) (Core (Comment))  Resp 16  Ht _0  (1.575 m)  Wt 136 lb 5 oz (61.83 kg)  BMI 24.93 kg/m2  SpO2 100%  HEMODYNAMICS: CVP:  [9 mmHg-12 mmHg] 11 mmHg  VENTILATOR SETTINGS: Vent Mode:  [-] PRVC FiO2 (%):  [40 %] 40 % Set Rate:  [16 bmp] 16 bmp Vt Set:  [400 mL] 400 mL PEEP:  [5 cmH20] 5 cmH20 Plateau Pressure:  [18 cmH20-20 cmH20] 18 cmH20  INTAKE / OUTPUT: I/O last 3 completed shifts: In: 3930.3 [I.V.:2787.3; HQPRF:163; IV [id number]:701] Out: 3125 [Urine:3125]  PHYSICAL EXAMINATION: General:  Intubated, sedated, opens eyes to voice.  Neuro:  RASS -1, pupils 2 mm, reactive. Opens eyes, moves all ext, follows commands  HEENT:  ETT/OGT in place. Moist mucus membranes.  Cardiovascular:  Mild bradycardia, no murmurs, gallops, rubs.  Lungs:  Air entry equal bilaterally. Decreased in bases  Abdomen:  Arctic sun pads in place.  Musculoskeletal:  No edema. Faint distal pulses. Cool to the touch. Skin:  Pallor. Cool.   LABS:  BMET  Recent Labs Lab 08/07/15 0751  08/07/15 1437 08/08/15 0410 08/09/15 0430  NA 133*  < > 132* 126* 132*  K 4.2  < > 4.5 4.5 3.9  CL 100*  < > 99* 94* 100*  CO2 19*  --   --  21* 23  BUN 9  < > _1 CREATININE 0.64  < > 0.50 0.91 0.76  GLUCOSE 106*  < > 110* 203* 88  < > = values in this interval not displayed.   Electrolytes  Recent Labs Lab 08/06/15 0400  08/07/15 0751 08/07/15 0755  08/07/15 2050 08/08/15 0410 08/09/15 0430  CALCIUM 8.3*  < > 8.2*  --   --  7.9* 8.2*  MG 1.9  --   --  2.1 2.2 2.0 2.1  PHOS 1.4*  --   --  3.6  --   --  3.1  < > = values in this interval not displayed.  CBC  Recent Labs Lab 08/07/15 0751  08/07/15 1437 08/08/15 0410 08/09/15 0430  WBC 19.7*  --   --  12.8* 9.7  HGB 11.6*  < > 13.6 10.4* 9.0*  HCT 33.4*  < > 40.0 31.0* 26.0*  PLT 321  --   --  319 244  < > = values in this interval not displayed.   Coag's  Recent Labs Lab 08/05/15 2351 08/06/15 0634  08/08/15 0410 08/08/15 1649 08/09/15 0430  APTT 56* 38*  --   --   --   --   INR 2.71* 2.54*  < > 9.40* 1.53* 1.41  < > = values in this interval not displayed. Sepsis Markers  Recent Labs Lab 08/05/15 2007 08/05/15 2135  LATICACIDVEN 5.7* 5.4*   ABG  Recent Labs Lab 08/07/15 1132 08/08/15 0409 08/09/15 0310  PHART 7.444 7.379 7.499*  PCO2ART 26.6* 29.7* 29.2*  PO2ART  122.0* 133* 172*   Liver Enzymes  Recent Labs Lab 08/05/15 2006 08/07/15 0923 08/09/15 0430  AST 38 31 564*  ALT 33 30 551*  ALKPHOS 76 97 97  BILITOT 0.8 1.4* 1.5*  ALBUMIN 2.6* 2.4* 2.4*   Cardiac Enzymes  Recent Labs Lab 08/06/15 0200 08/06/15 0400 08/06/15 0845  TROPONINI 0.52* 0.58* 0.65*    Glucose  Recent Labs Lab 08/08/15 1645 08/08/15 2012 08/08/15 2344 08/09/15 0147 08/09/15 0430 08/09/15 0730  GLUCAP 102* 93 77 84 81 84    Imaging Dg Chest Port 1 View  08/09/2015  CLINICAL DATA:  Check endotracheal tube placement EXAM: PORTABLE CHEST - 1 VIEW COMPARISON:  08/08/2015 FINDINGS: An endotracheal tube is again seen approximately 6 cm above the carina. A left jugular central line is noted at the cavoatrial junction. Nasogastric catheter extends into the stomach. Postsurgical changes are seen. Left-sided pleural effusion is again noted. Patchy infiltrates are again seen likely related to edema but stable. No new focal abnormality is noted. IMPRESSION: Tubes  and lines as described. Left-sided pleural effusion and bilateral pulmonary edema stable in appearance. Electronically Signed   By: Inez Catalina M.D.   On: 08/09/2015 08:05     STUDIES:  1/17 ECHO >>> Normal LV systolic function, mild LVH, mild LAE, trace AI. S/p MV repair with mild MS, no MR. Small pericardial effusion, left pleural effusion. 1/17 CXR >>> Diffuse bilateral airspace opacification, aspiration vs edema 1/17 CTA chest >>> No CT evidence of pulmonary embolism. Moderate bilateral pleural effusions with complete consolidative changes of the lower lobes and partial consolidative changes of the left upper lobe. Small high attenuating fluid along the left aspect of the aortic arch may represent complex fluid or small amount of blood. 1/18 EEG >>> Consistent with a generalized non-specific cerebral dysfunction (encephalopathy). 1/19 CT head >>> Negative CT head. 1/20 coagulopathic. Got FFP and vit K 1/21 IV heparin. Following commands. Not strong enough to wean.   CULTURES: Blood 1/18 >> Urine 1/18 >>neg  Resp 1/18 >>NF   ANTIBIOTICS: Unasyn 1/17 >>   SIGNIFICANT EVENTS: 1/17 PEA arrest with ROSC, VT arrest with ROSC, intubated, hypothermia protocol 1/19 Rewarmed, paralytics, sedation turned off 1/20: seen by neuro. Felt need more time to determine extent of neurological injury   LINES/TUBES: L IJ CVL 1/17 >> L Radial A-line 1/18 >> ETT 1/17 >>  DISCUSSION: 55 y.o. female w/ PMHx of severe MR s/p repair 07/22/15, NICM, chronic combined CHF, presented to [facility] ED after cardiac arrest.  ASSESSMENT / PLAN:  PULMONARY A: VDRF Aspiration Pneumonia vs Pulmonary Edema Large Bilateral Pleural Effusions L > R Respiratory alk  P:  Continue full vent support Decrease RR to 12  Daily SBT Repeat CXR in AM; may consider thora to assist w/ weaning efforts.  See ID   CARDIOVASCULAR A: S/p Cardiac arrest, PEA and VT (shock x3); thought to be related to primary  arrhythmia Primary Arrhythmia? Severe MR s/p Repair on Coumadin Atrial Fibrillation s/p LAA Clipping RVR vs Sinus Tach Labile Blood Pressure Chronic Combined CHF P:  Cardiology, EP following Neo gtt for MAP < 65, taper off Levo given tachycardia Hold Coumadin given supratherapeutic INR; starting IV heparin   RENAL A: Hyponatremia; stable  Improving Non-Anion Gap Metabolic Acidosis P:  Repeat BMP in AM Replace electrolytes as needed Check Mag, Phos in AM  GASTROINTESTINAL A: GI prophylaxis Shock liver  Nutrition P:  Protonix Start tube feeds today Repeat LFTs am   HEMATOLOGIC A: Leukocytosis; improved  On Chronic Coumadin Normocytic anemia Supratherapeutic INR; worsening-->now normal  P:  Goal INR 2-3; pharmacy following  Monitor closely for bleeding; Hb stable Repeat CBC, INR in AM SCD's  INFECTIOUS A: Likely Aspiration Pneumonia (NOS) P:  Continue Unasyn (complete 7d) Follow cultures  ENDOCRINE A: Hyperglycemia P:  CBG's q4h ISS  NEUROLOGIC A: Post-cardiac arrest anoxic encephalopathy  CT head normal  P:  Intermittent sedation for agitation Supportive care  On-going family support   FAMILY  - Updates: Family updated 1/19   S/p cardiac arrest s/p MVR. She is weaning off hypothermia protocol. Clinically she is following commands. We are slowly weaning off the levophed. Will repeat CXR in am, will consider low dose lasix in am. Will start IV heparin as her INR is < 2, hope to start weaning in am.   Erick Colace ACNP-BC [place] Pager # [phone] OR # [phone] if no answer

## 2015-08-09 NOTE — Progress Notes (Signed)
ANTICOAGULATION CONSULT NOTE - Follow Up Consult  Pharmacy Consult for Heparin  Indication: Afib/MVR  Allergies  Allergen Reactions  . Vicodin [Hydrocodone-Acetaminophen] Other (See Comments)    "she feels out of it", hallucinating  . Adhesive [Tape] Itching and Rash  . Silicone Itching and Rash    TAPE ALLERGY/EKG STICKER ALLERGY, Please use "paper" tape only    Patient Measurements: Height: 5\' 2"  (157.5 cm) Weight: 136 lb 5 oz (61.83 kg) IBW/kg (Calculated) : 50.1  Vital Signs: Temp: 98.4 F (36.9 C) (01/21 2000) Temp Source: Core (Comment) (01/21 2000) BP: 129/63 mmHg (01/21 2200) Pulse Rate: 85 (01/21 2200)  Labs:  Recent Labs  08/07/15 0751  08/07/15 1437 08/08/15 0410 08/08/15 1649 08/09/15 0430 08/09/15 2125  HGB 11.6*  < > 13.6 10.4*  --  9.0*  --   HCT 33.4*  < > 40.0 31.0*  --  26.0*  --   PLT 321  --   --  319  --  244  --   LABPROT  --   < >  --  72.2* 18.4* 17.4*  --   INR  --   < >  --  9.40* 1.53* 1.41  --   HEPARINUNFRC  --   --   --   --   --   --  <0.10*  CREATININE 0.64  < > 0.50 0.91  --  0.76  --   < > = values in this interval not displayed.  Estimated Creatinine Clearance: 67.9 mL/min (by C-G formula based on Cr of 0.76).   Assessment: 55 y/o F s/p arrest, also went into afib/flutter, now in NSR, started heparin today, pt is s/p hypothermia protocol, first HL is undetectable, no issues per RN.   Goal of Therapy:  Heparin level 0.3-0.7 units/ml Monitor platelets by anticoagulation protocol: Yes   Plan:  -Increase heparin drip to 1150 units/hr -0700 HL  Abran Duke 08/09/2015,10:57 PM

## 2015-08-09 NOTE — Progress Notes (Signed)
SUBJECTIVE: The patient is doing better today.  She went into sinus rhythm and her rate is now stable in the 80s.  She is following some commands and has been shaking her head to answer some questions.    Marland Kitchen ampicillin-sulbactam (UNASYN) IV  1.5 g Intravenous Q6H  . antiseptic oral rinse  7 mL Mouth Rinse 10 times per day  . chlorhexidine gluconate  15 mL Mouth Rinse BID  . insulin aspart  0-9 Units Subcutaneous 6 times per day  . pantoprazole (PROTONIX) IV  40 mg Intravenous QHS  . sodium chloride  10-40 mL Intracatheter Q12H   . feeding supplement (VITAL AF 1.2 CAL)    . heparin    . norepinephrine (LEVOPHED) Adult infusion 3 mcg/min (08/09/15 1130)  . phenylephrine (NEO-SYNEPHRINE) Adult infusion Stopped (08/09/15 0818)    OBJECTIVE: Physical Exam: Filed Vitals:   08/09/15 1000 08/09/15 1100 08/09/15 1158 08/09/15 1217  BP: 98/55 118/57 143/61 123/61  Pulse: 78 87 74 82  Temp:    98.5 F (36.9 C)  TempSrc:    Oral  Resp: 16 16  16   Height:      Weight:      SpO2: 100% 100%  100%    Intake/Output Summary (Last 24 hours) at 08/09/15 1327 Last data filed at 08/09/15 1049  Gross per 24 hour  Intake 1725.68 ml  Output   2925 ml  Net -1199.32 ml    Telemetry reveals sinus rhythm  GEN- following some commands, nodding head when asked questions   Head- normocephalic, atraumatic Eyes-  Sclera clear, conjunctiva pink Ears- hearing intact Oropharynx- ET tube in place Neck- supple, no JVP Lymph- no cervical lymphadenopathy Lungs- Clear to ausculation bilaterally, normal work of breathing Heart- Regular rate and rhythm, no murmurs, rubs or gallops, PMI not laterally displaced GI- soft, NT, ND, + BS Extremities- no clubbing, cyanosis    LABS: Basic Metabolic Panel:  Recent Labs  16/10/96 0755  08/08/15 0410 08/09/15 0430  NA  --   < > 126* 132*  K  --   < > 4.5 3.9  CL  --   < > 94* 100*  CO2  --   --  21* 23  GLUCOSE  --   < > 203* 88  BUN  --   < > 9 7    CREATININE  --   < > 0.91 0.76  CALCIUM  --   --  7.9* 8.2*  MG 2.1  < > 2.0 2.1  PHOS 3.6  --   --  3.1  < > = values in this interval not displayed. Liver Function Tests:  Recent Labs  08/07/15 0923 08/09/15 0430  AST 31 564*  ALT 30 551*  ALKPHOS 97 97  BILITOT 1.4* 1.5*  PROT 5.3* 5.1*  ALBUMIN 2.4* 2.4*   No results for input(s): LIPASE, AMYLASE in the last 72 hours. CBC:  Recent Labs  08/07/15 0751  08/08/15 0410 08/09/15 0430  WBC 19.7*  --  12.8* 9.7  NEUTROABS 16.9*  --  10.7*  --   HGB 11.6*  < > 10.4* 9.0*  HCT 33.4*  < > 31.0* 26.0*  MCV 92.8  --  94.2 92.5  PLT 321  --  319 244  < > = values in this interval not displayed. Cardiac Enzymes: No results for input(s): CKTOTAL, CKMB, CKMBINDEX, TROPONINI in the last 72 hours. BNP: Invalid input(s): POCBNP D-Dimer: No results for input(s): DDIMER in the last  72 hours. Hemoglobin A1C: No results for input(s): HGBA1C in the last 72 hours. Fasting Lipid Panel: No results for input(s): CHOL, HDL, LDLCALC, TRIG, CHOLHDL, LDLDIRECT in the last 72 hours. Thyroid Function Tests: No results for input(s): TSH, T4TOTAL, T3FREE, THYROIDAB in the last 72 hours.  Invalid input(s): FREET3 Anemia Panel: No results for input(s): VITAMINB12, FOLATE, FERRITIN, TIBC, IRON, RETICCTPCT in the last 72 hours.  RADIOLOGY: Dg Chest 2 View  07/31/2015  CLINICAL DATA:  Worsening shortness of breath, cough, and peripheral edema. 9 days postop from mitral valve replacement. EXAM: CHEST  2 VIEW COMPARISON:  07/25/2015 FINDINGS: Increased moderate left pleural effusion is seen. Small right pleural effusion shows no significant change. There is increased atelectasis or infiltrate in the left lower lung. No pneumothorax visualized. Previous median sternotomy and mitral valve replacement noted. IMPRESSION: Increased moderate left pleural effusion and left lower lobe atelectasis versus infiltrate. No significant change in small right pleural  effusion. Electronically Signed   By: Myles Rosenthal M.D.   On: 07/31/2015 15:51   Dg Chest 2 View  07/25/2015  CLINICAL DATA:  Atelectasis, shortness of breath, CHF, status post mitral valve repair and ligation of PDA EXAM: CHEST  2 VIEW COMPARISON:  Portable chest x-ray of July 24, 2015 FINDINGS: The left lung is well-expanded. There is a tiny pleural effusion on the left inferiorly. On the right there is a small pleural effusion and mild basilar atelectasis. The heart is top-normal in size. The pulmonary vascularity is normal. A left atrial appendage clip and a prosthetic mitral valve ring are visible. There are 8 intact sternal wires. The mediastinum is normal in width. There is no pneumothorax. IMPRESSION: Improved aeration and decreasing small bilateral pleural effusions. There is no pulmonary edema. Electronically Signed   By: David  [place] M.D.   On: 07/25/2015 07:44   Dg Chest 2 View  07/17/2015  CLINICAL DATA:  55 year old female preoperative study for cardiac surgery in January. Mitral regurgitation. Central chest pressure, nonproductive cough, shortness of breath. Initial encounter. EXAM: CHEST  2 VIEW COMPARISON:  CTA chest 07/16/2015 FINDINGS: Stable mild cardiomegaly. Other mediastinal contours are within normal limits. Visualized tracheal air column is within normal limits. No pneumothorax, pulmonary edema, pleural effusion or confluent pulmonary opacity. No acute osseous abnormality identified. IMPRESSION: Stable mild cardiomegaly.  No acute cardiopulmonary abnormality. Electronically Signed   By: Odessa Fleming M.D.   On: 07/17/2015 13:55   Ct Head Wo Contrast  08/07/2015  CLINICAL DATA:  Status post cardiac arrest. History of CHF, aortic aneurysm. EXAM: CT HEAD WITHOUT CONTRAST TECHNIQUE: Contiguous axial images were obtained from the base of the skull through the vertex without intravenous contrast. COMPARISON:  MRI of the brain January 14, 2015 FINDINGS: The ventricles and sulci are normal. No  intraparenchymal hemorrhage, mass effect nor midline shift. No acute large vascular territory infarcts. No abnormal extra-axial fluid collections. Basal cisterns are patent. No skull fracture. Subcentimeter calcification LEFT periorbital soft tissues. The included ocular globes and orbital contents are non-suspicious. The mastoid aircells and included paranasal sinuses are well-aerated. IMPRESSION: Negative CT head. Electronically Signed   By: Awilda Metro M.D.   On: 08/07/2015 23:36   Ct Angio Chest Pe W/cm &/or Wo Cm  08/05/2015  CLINICAL DATA:  55 year old female with shortness of breath. Status post CPR. EXAM: CT ANGIOGRAPHY CHEST WITH CONTRAST TECHNIQUE: Multidetector CT imaging of the chest was performed using the standard protocol during bolus administration of intravenous contrast. Multiplanar CT image reconstructions and MIPs  were obtained to evaluate the vascular anatomy. CONTRAST:  80mL OMNIPAQUE IOHEXOL 350 MG/ML SOLN COMPARISON:  Radiograph dated 08/05/2015 FINDINGS: An endotracheal tube is noted with tip at the carina tilting towards the right mainstem bronchus. Recommend retraction and repositioning by approximately 3- 4 cm. Moderate bilateral pleural effusions. There is complete consolidation of the lower lobes bilaterally with air bronchograms. There is partial consolidative changes of the left upper lobe and lingula. There is ground-glass opacity within the lingula. Findings may represent pneumonia, or pulmonary edema or ARDS. Clinical correlation is recommended. There is no pneumothorax. The visualized thoracic aorta appears unremarkable. No CT evidence of pulmonary embolism. Top-normal cardiac size. Mechanical mitral valve. Small pockets of air noted within the right atrium, likely iatrogenic from IV injection. There is retrograde flow of contrast from the right atrium into the IVC compatible with a degree of right cardiac dysfunction. No significant pericardial effusion. Small amount of  fluid along the left aortic arch in the upper mediastinum may represent complex fluid or small amount of blood/serosanguineous fluid. Small pockets of air noted within this fluid in the upper mediastinum compatible with small pneumomediastinum. An enteric tube is partially visualized extending into the upper abdomen with tip not included in the CT. There is no axillary adenopathy. There is diffuse subcutaneous soft tissue edema and anasarca. Mild degenerative changes of the spine. Median sternotomy wires age indeterminate fracture of the left first rib, new from prior study possibly acute or subacute. No other acute fracture identified. Review of the MIP images confirms the above findings. IMPRESSION: No CT evidence of pulmonary embolism. Moderate bilateral pleural effusions with complete consolidative changes of the lower lobes and partial consolidative changes of the left upper lobe. Clinical correlation is recommended. Endotracheal tube the tip at the origin of the right mainstem bronchus. Recommend retraction and repositioning by approximately 3-4 cm. Small high attenuating fluid along the left aspect of the aortic arch may represent complex fluid or small amount of blood. These results were called by telephone at the time of interpretation on 08/05/2015 at 10:59 pm to Dr. Marijean Niemann , who verbally acknowledged these results. Electronically Signed   By: Elgie Collard M.D.   On: 08/05/2015 23:04   Dg Chest Port 1 View  08/09/2015  CLINICAL DATA:  Check endotracheal tube placement EXAM: PORTABLE CHEST - 1 VIEW COMPARISON:  08/08/2015 FINDINGS: An endotracheal tube is again seen approximately 6 cm above the carina. A left jugular central line is noted at the cavoatrial junction. Nasogastric catheter extends into the stomach. Postsurgical changes are seen. Left-sided pleural effusion is again noted. Patchy infiltrates are again seen likely related to edema but stable. No new focal abnormality is noted.  IMPRESSION: Tubes and lines as described. Left-sided pleural effusion and bilateral pulmonary edema stable in appearance. Electronically Signed   By: Alcide Clever M.D.   On: 08/09/2015 08:05   Dg Chest Port 1 View  08/08/2015  CLINICAL DATA:  Cardiac arrest EXAM: PORTABLE CHEST 1 VIEW COMPARISON:  08/07/2015 FINDINGS: Endotracheal tube in good position. Central venous catheter tip in the SVC unchanged. NG tube in the stomach. Moderately large left effusion with left lower lobe airspace disease unchanged. Smaller right effusion with right lower lobe airspace disease unchanged. Diffuse bilateral airspace disease may represent edema and has progressed. IMPRESSION: Progressive bilateral airspace disease most likely due to edema. No change in bilateral pleural effusions left greater than right with compressive atelectasis in the bases. Endotracheal tube remains in good position. Electronically Signed  By: Marlan Palau M.D.   On: 08/08/2015 08:06   Dg Chest Port 1 View  08/07/2015  CLINICAL DATA:  Heart failure. EXAM: PORTABLE CHEST 1 VIEW COMPARISON:  08/05/2015. FINDINGS: Endotracheal tube, NG tube, left IJ line in stable position. Prior cardiac valve replacement. Left atrial appendage clip noted. Partial clearing of bilateral pulmonary infiltrates/edema. Persistent but slightly improved bilateral pleural effusions. No pneumothorax . IMPRESSION: 1. Lines and tubes in stable position. 2. Partial clearing of bilateral pulmonary edema and pleural effusions. 3. Prior cardiac valve replacement.  Stable cardiomegaly. Electronically Signed   By: Maisie Fus  Register   On: 08/07/2015 07:32   Dg Chest Port 1 View  08/05/2015  CLINICAL DATA:  Central line placement.  Initial encounter. EXAM: PORTABLE CHEST 1 VIEW COMPARISON:  Chest radiograph performed earlier today at 7:10 p.m., and CTA of the chest performed earlier today at 10:15 p.m. FINDINGS: The left IJ line is noted ending about the distal SVC. The patient's  endotracheal tube is noted ending 2-3 cm above the carina. An enteric tube is noted extending below the diaphragm. Vascular congestion is again noted. Note is made of small right and small to moderate left pleural effusions, and likely underlying pulmonary edema. No pneumothorax is seen. The cardiomediastinal silhouette is mildly enlarged. The patient is status post median sternotomy. A mitral valve replacement is noted. No acute osseous abnormalities are identified. IMPRESSION: 1. Left IJ line noted ending about the distal SVC. 2. Endotracheal tube noted ending 2-3 cm above the carina. 3. Vascular congestion and mild cardiomegaly. Small right and small to moderate left pleural effusions, with likely underlying pulmonary edema. Electronically Signed   By: Roanna Raider M.D.   On: 08/05/2015 23:50   Dg Chest Portable 1 View  08/05/2015  CLINICAL DATA:  Status post CPR. Endotracheal tube placement. Initial encounter. EXAM: PORTABLE CHEST 1 VIEW COMPARISON:  Chest radiograph performed 07/31/2015 FINDINGS: The patient's endotracheal tube is noted ending overlying the right mainstem bronchus, 1 cm below the carina. This should be retracted 4 cm. Diffuse bilateral airspace opacification is noted, with a small to moderate left-sided pleural effusion. This is concerning for pulmonary edema, though pneumonia cannot be excluded. No pneumothorax is seen. The cardiomediastinal silhouette is mildly enlarged. The patient is status post median sternotomy. External pacing pads are noted. No acute osseous abnormalities are seen. IMPRESSION: 1. Endotracheal tube noted ending overlying the right mainstem bronchus, 1 cm below the carina. This should be retracted 4 cm. 2. Diffuse bilateral airspace opacification, with a small to moderate left-sided pleural effusion. This is concerning for pulmonary edema, though pneumonia cannot be excluded. 3. Mild cardiomegaly. These results were called by telephone at the time of interpretation  on 08/05/2015 at 9:20 pm to Nursing at the [facility] ER, who verbally acknowledged these results. The endotracheal tube had already been repositioned. Electronically Signed   By: Roanna Raider M.D.   On: 08/05/2015 21:50   Dg Chest Port 1 View  07/24/2015  CLINICAL DATA:  Mitral valve repair. EXAM: PORTABLE CHEST 1 VIEW COMPARISON:  07/23/2015. FINDINGS: Interim removal of Swan-Ganz catheter. Right IJ sheath in good anatomic position. Left chest tube in stable position. Mediastinal drainage catheters in stable position. Prior median sternotomy and cardiac valve replacement. Left atrial appendage clip noted. Cardiomegaly with with improvement of pulmonary interstitial prominence suggesting improving congestive heart failure. No prominent pleural effusion. No pneumothorax . IMPRESSION: 1. Interim removal Swan-Ganz catheter. Remaining lines and tubes including left chest tube in stable position.  No pneumothorax. 2. Prior mitral valve replacement. Cardiomegaly with partial clearing of pulmonary interstitial edema. Electronically Signed   By: Maisie Fus  Register   On: 07/24/2015 07:58   Dg Chest Port 1 View  07/23/2015  CLINICAL DATA:  Status post mitral valve repair, PDA ligation and clipping of left atrial appendage. EXAM: PORTABLE CHEST 1 VIEW COMPARISON:  07/22/2015 FINDINGS: Patient has been extubated. Swan-Ganz catheter tip positioning stable in the proximal right pulmonary artery. Mediastinal drain and left chest tube remain. No pneumothorax. Mild increase in bilateral pleural effusions and bibasilar atelectasis since the prior exam. Stable mild interstitial edema. IMPRESSION: Mild increase in bilateral pleural effusions and bibasilar atelectasis. Mild interstitial edema appears stable. No pneumothorax identified. Electronically Signed   By: Irish Lack M.D.   On: 07/23/2015 08:12   Dg Chest Port 1 View  07/22/2015  CLINICAL DATA:  Status post CABG, ETT, and OG tube. EXAM: PORTABLE CHEST 1 VIEW COMPARISON:   July 17, 2015 FINDINGS: The patient is status post CABG. The distal tip of the PA catheter terminates in the pulmonary outflow tract. The ETT terminates 4.2 cm above the carina. Two left-sided chest tubes and a mediastinal drain are both noted. The NG tube terminates with the distal tip near the GE junction and the side port 7.5 cm above the GE junction. The patient is status post aortic valve replacement. No pneumothorax. The heart, hila, and mediastinum are unchanged. Mild central interstitial prominence consistent with mild edema. There is a tiny amount of postoperative mediastinal air. No other acute abnormalities identified. IMPRESSION: 1. Postoperative changes in the chest with support apparatus as described above. Mild edema. 2. The side port of the NG tube is 7.5 cm above the GE junction. Recommend advancement as clinically warranted. Electronically Signed   By: Gerome Sam III M.D   On: 07/22/2015 14:01   Ct Angio Chest Aorta W/cm &/or Wo/cm  07/16/2015  CLINICAL DATA:  Shortness of breath, cough, preop for mitral valve replacement. EXAM: CT ANGIOGRAPHY CHEST, ABDOMEN AND PELVIS TECHNIQUE: Multidetector CT imaging through the chest, abdomen and pelvis was performed using the standard protocol during bolus administration of intravenous contrast. Multiplanar reconstructed images and MIPs were obtained and reviewed to evaluate the vascular anatomy. CONTRAST:  75 mL of Isovue 370 intravenously. COMPARISON:  None. FINDINGS: CTA CHEST FINDINGS No pneumothorax or pleural effusion is noted. No acute pulmonary disease is noted. There is no evidence of thoracic aortic dissection or aneurysm. Great vessels are widely patent. The patient does appear to have a patent ductus arteriosus with jet seen extending into the main pulmonary artery. There is noted enlargement of the left atrium and left atrial appendage. No significant osseous abnormality is noted. No mediastinal mass or adenopathy is noted. Review  of the MIP images confirms the above findings. CTA ABDOMEN AND PELVIS FINDINGS No gallstones are noted. The liver, spleen and pancreas are unremarkable. Adrenal glands and kidneys appear normal. No hydronephrosis or renal obstruction is noted. There is no evidence of abdominal aortic aneurysm or dissection. Mesenteric and renal arteries are widely patent. There is no evidence of bowel obstruction. The appendix appears normal. Iliac arteries are widely patent without significant stenosis. Urinary bladder appears normal. Uterus appears normal. Small amount of free fluid is noted in the pelvis which most likely is physiologic. No significant adenopathy is noted. No significant osseous abnormality is noted in the abdomen or pelvis. Review of the MIP images confirms the above findings. IMPRESSION: No evidence of thoracic or abdominal  aortic aneurysm or dissection. Enlargement of the left atrium and atrial appendage is noted consistent with mitral valve regurgitation. Patent ductus arteriosus is noted. These results Bridie Colquhoun be called to the ordering clinician or representative by the Radiologist Assistant, and communication documented in the PACS or zVision Dashboard. Electronically Signed   By: Lupita Raider, M.D.   On: 07/16/2015 15:33   Ct Angio Abd/pel W/ And/or W/o  07/16/2015  CLINICAL DATA:  Shortness of breath, cough, preop for mitral valve replacement. EXAM: CT ANGIOGRAPHY CHEST, ABDOMEN AND PELVIS TECHNIQUE: Multidetector CT imaging through the chest, abdomen and pelvis was performed using the standard protocol during bolus administration of intravenous contrast. Multiplanar reconstructed images and MIPs were obtained and reviewed to evaluate the vascular anatomy. CONTRAST:  75 mL of Isovue 370 intravenously. COMPARISON:  None. FINDINGS: CTA CHEST FINDINGS No pneumothorax or pleural effusion is noted. No acute pulmonary disease is noted. There is no evidence of thoracic aortic dissection or aneurysm. Great  vessels are widely patent. The patient does appear to have a patent ductus arteriosus with jet seen extending into the main pulmonary artery. There is noted enlargement of the left atrium and left atrial appendage. No significant osseous abnormality is noted. No mediastinal mass or adenopathy is noted. Review of the MIP images confirms the above findings. CTA ABDOMEN AND PELVIS FINDINGS No gallstones are noted. The liver, spleen and pancreas are unremarkable. Adrenal glands and kidneys appear normal. No hydronephrosis or renal obstruction is noted. There is no evidence of abdominal aortic aneurysm or dissection. Mesenteric and renal arteries are widely patent. There is no evidence of bowel obstruction. The appendix appears normal. Iliac arteries are widely patent without significant stenosis. Urinary bladder appears normal. Uterus appears normal. Small amount of free fluid is noted in the pelvis which most likely is physiologic. No significant adenopathy is noted. No significant osseous abnormality is noted in the abdomen or pelvis. Review of the MIP images confirms the above findings. IMPRESSION: No evidence of thoracic or abdominal aortic aneurysm or dissection. Enlargement of the left atrium and atrial appendage is noted consistent with mitral valve regurgitation. Patent ductus arteriosus is noted. These results Ravonda Brecheen be called to the ordering clinician or representative by the Radiologist Assistant, and communication documented in the PACS or zVision Dashboard. Electronically Signed   By: Lupita Raider, M.D.   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  She was apparently found to be in asystole on EMS arrival but did have episodes of wide complex tachycardia.  She went into atrial fibrillation/flutter while here and was put on amiodarone but went into junctional rhythm and this was stopped.  She is now in sinus rhythm, agree with holding amiodarone.  Anticoagulation can be started.  Parul Porcelli continue to follow.  Clorissa Gruenberg Jorja Loa, MD 08/09/2015 1:27 PM

## 2015-08-09 NOTE — Progress Notes (Signed)
Nutrition Consult Follow-up  INTERVENTION:   Continuous Vital AF 1.2 @ 25 ml/hr via OG tube and increase by 10 ml every 4 hours to goal rate of 45 ml/hr.   Tube feeding regimen provides 1296 kcal (103% of needs), 81 grams of protein, and 875 ml of H2O.   NUTRITION DIAGNOSIS:   Inadequate oral intake related to inability to eat as evidenced by NPO status.  GOAL:   Patient will meet greater than or equal to 90% of their needs  MONITOR:   Vent status, Labs, I & O's  REASON FOR ASSESSMENT: consult to manage tube feeding Ventilator   ASSESSMENT:   55 y.o. female w/ PMHx of severe MR s/p repair 07/22/15, NICM, chronic combined CHF, presented to [facility] ED after cardiac arrest.   Patient is currently intubated on ventilator support. She is rewarmed and cleared now to start tube feeds via OG.  MV: 6.2 L/min Temp (24hrs), Avg:98.4 F (36.9 C), Min:97.3 F (36.3 C), Max:98.8 F (37.1 C)  Labs reviewed: sodium remains  low (132)   Diet Order:    NPO  Skin:  Reviewed, no issues  Last BM:  unknown  Height:   Ht Readings from Last 1 Encounters:  08/05/15 5\' 2"  (1.575 m)   Weight:   Wt Readings from Last 1 Encounters:  08/09/15 136 lb 5 oz (61.83 kg)   Ideal Body Weight:  50 kg  BMI:  Body mass index is 24.93 kg/(m^2).  Estimated Nutritional Needs:   Kcal:  1257  Protein:  75-90 grams  Fluid:  > 1.5 L/day  Royann Shivers MS,RD,CSG,LDN Office: [phone] Pager: [phone]

## 2015-08-10 ENCOUNTER — Inpatient Hospital Stay (HOSPITAL_COMMUNITY): Payer: BLUE CROSS/BLUE SHIELD

## 2015-08-10 LAB — CBC
HEMATOCRIT: 27.8 % — AB (ref 36.0–46.0)
Hemoglobin: 9.5 g/dL — ABNORMAL LOW (ref 12.0–15.0)
MCH: 31.9 pg (ref 26.0–34.0)
MCHC: 34.2 g/dL (ref 30.0–36.0)
MCV: 93.3 fL (ref 78.0–100.0)
Platelets: 256 10*3/uL (ref 150–400)
RBC: 2.98 MIL/uL — ABNORMAL LOW (ref 3.87–5.11)
RDW: 14.8 % (ref 11.5–15.5)
WBC: 6.9 10*3/uL (ref 4.0–10.5)

## 2015-08-10 LAB — COMPREHENSIVE METABOLIC PANEL
ALT: 364 U/L — AB (ref 14–54)
AST: 202 U/L — AB (ref 15–41)
Albumin: 2.2 g/dL — ABNORMAL LOW (ref 3.5–5.0)
Alkaline Phosphatase: 101 U/L (ref 38–126)
Anion gap: 9 (ref 5–15)
BUN: 12 mg/dL (ref 6–20)
CHLORIDE: 100 mmol/L — AB (ref 101–111)
CO2: 23 mmol/L (ref 22–32)
Calcium: 8.1 mg/dL — ABNORMAL LOW (ref 8.9–10.3)
Creatinine, Ser: 0.6 mg/dL (ref 0.44–1.00)
GFR calc Af Amer: >60 mL/min (ref >60)
GFR calc non Af Amer: >60 mL/min (ref >60)
GLUCOSE: 121 mg/dL — AB (ref 65–99)
POTASSIUM: 3.5 mmol/L (ref 3.5–5.1)
SODIUM: 132 mmol/L — AB (ref 135–145)
TOTAL PROTEIN: 5.2 g/dL — AB (ref 6.5–8.1)
Total Bilirubin: 1.7 mg/dL — ABNORMAL HIGH (ref 0.3–1.2)

## 2015-08-10 LAB — PHOSPHORUS: Phosphorus: 3.2 mg/dL (ref 2.5–4.6)

## 2015-08-10 LAB — GLUCOSE, CAPILLARY
GLUCOSE-CAPILLARY: 139 mg/dL — AB (ref 65–99)
Glucose-Capillary: 116 mg/dL — ABNORMAL HIGH (ref 65–99)
Glucose-Capillary: 142 mg/dL — ABNORMAL HIGH (ref 65–99)
Glucose-Capillary: 157 mg/dL — ABNORMAL HIGH (ref 65–99)
Glucose-Capillary: 74 mg/dL (ref 65–99)
Glucose-Capillary: 99 mg/dL (ref 65–99)

## 2015-08-10 LAB — HIV ANTIBODY (ROUTINE TESTING W REFLEX): HIV Screen 4th Generation wRfx: NONREACTIVE

## 2015-08-10 LAB — HEPARIN LEVEL (UNFRACTIONATED): Heparin Unfractionated: 0.55 IU/mL (ref 0.30–0.70)

## 2015-08-10 LAB — MAGNESIUM: Magnesium: 2 mg/dL (ref 1.7–2.4)

## 2015-08-10 MED ORDER — FUROSEMIDE 10 MG/ML IJ SOLN
80.0000 mg | Freq: Two times a day (BID) | INTRAMUSCULAR | Status: DC
  Administered 2015-08-10 – 2015-08-12 (×5): 80 mg via INTRAVENOUS
  Filled 2015-08-10 (×6): qty 8

## 2015-08-10 MED ORDER — CHLORHEXIDINE GLUCONATE 0.12% ORAL RINSE (MEDLINE KIT)
15.0000 mL | Freq: Two times a day (BID) | OROMUCOSAL | Status: DC
  Administered 2015-08-11 – 2015-08-12 (×3): 15 mL via OROMUCOSAL

## 2015-08-10 MED ORDER — ONDANSETRON HCL 4 MG/2ML IJ SOLN
4.0000 mg | Freq: Four times a day (QID) | INTRAMUSCULAR | Status: DC | PRN
  Administered 2015-08-10: 4 mg via INTRAVENOUS
  Filled 2015-08-10 (×2): qty 2

## 2015-08-10 MED ORDER — POTASSIUM CHLORIDE 10 MEQ/50ML IV SOLN
10.0000 meq | INTRAVENOUS | Status: AC
  Administered 2015-08-10 (×4): 10 meq via INTRAVENOUS
  Filled 2015-08-10 (×4): qty 50

## 2015-08-10 MED ORDER — ANTISEPTIC ORAL RINSE SOLUTION (CORINZ)
7.0000 mL | Freq: Four times a day (QID) | OROMUCOSAL | Status: DC
  Administered 2015-08-10 – 2015-08-12 (×5): 7 mL via OROMUCOSAL

## 2015-08-10 MED ORDER — POTASSIUM CHLORIDE 20 MEQ/15ML (10%) PO SOLN
40.0000 meq | Freq: Once | ORAL | Status: AC
  Administered 2015-08-10: 40 meq
  Filled 2015-08-10: qty 30

## 2015-08-10 MED ORDER — FUROSEMIDE 10 MG/ML IJ SOLN: 80.0000 mg | Freq: Once | INTRAMUSCULAR | Status: DC

## 2015-08-10 MED ORDER — FUROSEMIDE 10 MG/ML IJ SOLN
40.0000 mg | Freq: Once | INTRAMUSCULAR | Status: AC
  Administered 2015-08-10: 40 mg via INTRAVENOUS

## 2015-08-10 NOTE — Progress Notes (Signed)
PULMONARY / CRITICAL CARE MEDICINE   Name: Diane [name] MRN: [id number] DOB: 1961-01-31    ADMISSION DATE:  08/05/2015 CONSULTATION DATE:  08/05/15  REFERRING MD :  EDP PRIMARY SERVICE: PCCM   BRIEF PATIENT DESCRIPTION: 54-yo F with MVR repair on 07/22/15 found to be in PEA/VT arrest with 3 shocks and ROSC after 7-9 minutes requiring hypothermic protocol.   SIGNIFICANT EVENTS / STUDIES:  1/17 Admitted for PEA/VT arrest, intubated, placed on hypothermic protocol   1/17 CTA chest with no PO, moderate bilateral pleural effusions and consolidative changes of lower lobes 1/17 Echo with normal EF, mild LVH, mild LAW, trace AI, mild MS, no MR, small pericardial effusion, left pleural effusion 1/18 EEG with generalized slowing  1/19 Rewarmed, paralytics, sedation turned off 1/19 CT head normal 1/20 CXR with left sided pleural effusion and b/l pulmonary edema  1/20 INR 9.4 requiring FFP and vitamin K 1/20 IV amio stopped  1/20 EEG with generalized slowing  1/21 Improved mental status, start IV heparin    LINES / TUBES: 1/17 ETT >> 1/17 Central line >> 1/18 OG >> 1/18 Foley >> 1/18 Aline >>>  CULTURES: 1/18 Blood >> 1/18 Tracheal >> normal flora 1/18 Urine >> 1/18 MRSA >> neg    ANTIBIOTICS: 1/18 Unasyn >>   SUBJECTIVE:  Pt's mental status is much improved per nurse. She is  able to nod to questions and follow commands. Currently alert and minimally sedated. Still on low dose levo. IV heparin started yesterday with no reported bleeding. Also on tube feeds.  Could not wean from vent yesterday.   VITAL SIGNS: Temp:  [98.4 F (36.9 C)-98.6 F (37 C)] 98.6 F (37 C) (01/22 0400) Pulse Rate:  [74-91] 85 (01/22 0600) Resp:  [16-19] 16 (01/22 0600) BP: (84-154)/(42-77) 115/58 mmHg (01/22 0600) SpO2:  [100 %] 100 % (01/22 0600) Arterial Line BP: (105-162)/(49-72) 127/59 mmHg (01/22 0600) FiO2 (%):  [30 %-40 %] 30 % (01/22 0400) Weight:  [134 lb 11.2 oz (61.1 kg)] 134 lb 11.2  oz (61.1 kg) (01/22 0355) HEMODYNAMICS: CVP:  [9 mmHg-11 mmHg] 9 mmHg VENTILATOR SETTINGS: Vent Mode:  [-] PRVC FiO2 (%):  [30 %-40 %] 30 % Set Rate:  [16 bmp] 16 bmp Vt Set:  [400 mL] 400 mL PEEP:  [5 cmH20] 5 cmH20 Plateau Pressure:  [18 cmH20-21 cmH20] 20 cmH20 INTAKE / OUTPUT: Intake/Output      01/21 0701 - 01/22 0700 01/22 0701 - 01/23 0700   I.V. (mL/kg) 1069.2 (17.5)    Blood     NG/GT 564.2    IV Piggyback 200    Total Intake(mL/kg) 1833.4 (30)    Urine (mL/kg/hr) 1075 (0.7)    Total Output 1075     Net +758.4            PHYSICAL EXAMINATION: General:  NAD, intubated on vent Neuro:  Alert, nods to questions, follows commands HEENT:  OG, normocephalic  Cardiovascular:  Normal rate and rhythm  Lungs:  Ronchi/coarse BS in anterior fields  Abdomen: Arctic pads on, non-tender, nondistended, decreased BS Musculoskeletal:  SCD's on, trace pedal edema Skin:  No rashes  LABS:  CBC  Recent Labs Lab 08/08/15 0410 08/09/15 0430 08/10/15 0420  WBC 12.8* 9.7 6.9  HGB 10.4* 9.0* 9.5*  HCT 31.0* 26.0* 27.8*  PLT 319 244 256   Coag's  Recent Labs Lab 08/05/15 2351 08/06/15 0634  08/08/15 0410 08/08/15 1649 08/09/15 0430  APTT 56* 38*  --   --   --   --  INR 2.71* 2.54*  < > 9.40* 1.53* 1.41  < > = values in this interval not displayed. BMET  Recent Labs Lab 08/08/15 0410 08/09/15 0430 08/10/15 0420  NA 126* 132* 132*  K 4.5 3.9 3.5  CL 94* 100* 100*  CO2 21* 23 23  BUN 9 7 12   CREATININE 0.91 0.76 0.60  GLUCOSE 203* 88 121*   Electrolytes  Recent Labs Lab 08/07/15 0755  08/08/15 0410 08/09/15 0430 08/10/15 0420  CALCIUM  --   --  7.9* 8.2* 8.1*  MG 2.1  < > 2.0 2.1 2.0  PHOS 3.6  --   --  3.1 3.2  < > = values in this interval not displayed. Sepsis Markers  Recent Labs Lab 08/05/15 2007 08/05/15 2135  LATICACIDVEN 5.7* 5.4*   ABG  Recent Labs Lab 08/07/15 1132 08/08/15 0409 08/09/15 0310  PHART 7.444 7.379 7.499*  PCO2ART  26.6* 29.7* 29.2*  PO2ART 122.0* 133* 172*   Liver Enzymes  Recent Labs Lab 08/07/15 0923 08/09/15 0430 08/10/15 0420  AST 31 564* 202*  ALT 30 551* 364*  ALKPHOS 97 97 101  BILITOT 1.4* 1.5* 1.7*  ALBUMIN 2.4* 2.4* 2.2*   Cardiac Enzymes  Recent Labs Lab 08/06/15 0200 08/06/15 0400 08/06/15 0845  TROPONINI 0.52* 0.58* 0.65*   Glucose  Recent Labs Lab 08/09/15 0730 08/09/15 1216 08/09/15 1614 08/09/15 2026 08/09/15 2329 08/10/15 0349  GLUCAP 84 77 74 95 96 116*    Imaging Dg Chest Port 1 View  08/09/2015  CLINICAL DATA:  Check endotracheal tube placement EXAM: PORTABLE CHEST - 1 VIEW COMPARISON:  08/08/2015 FINDINGS: An endotracheal tube is again seen approximately 6 cm above the carina. A left jugular central line is noted at the cavoatrial junction. Nasogastric catheter extends into the stomach. Postsurgical changes are seen. Left-sided pleural effusion is again noted. Patchy infiltrates are again seen likely related to edema but stable. No new focal abnormality is noted. IMPRESSION: Tubes and lines as described. Left-sided pleural effusion and bilateral pulmonary edema stable in appearance. Electronically Signed   By: Alcide Clever M.D.   On: 08/09/2015 08:05     CXR: pending   ASSESSMENT / PLAN:  PULMONARY A: Ventilator dependent respiratory failure in setting of cardiopulmonary arrest Aspiration pneumonia  Bilateral pulmonary edema  Left-sided pleural effusion P:  Continue full vent settings  SBT today  Wean PEEP/FIO2 to keep SpO2 >92% Continue IV unasyn Day 5/7 Per cards ok to start diuresis, will start IV lasix 80 mg BID May need thoracentesis  CXR in AM  CARDIOVASCULAR A: PEA and VT arrest with ROSC 7-9 minutes s/p hypothermic protocol - unclear cause Severe MR s/p repair (07/22/15) on coumadin  Atrial fibrillation s/p LAA clipping (07/22/15) 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% on 08/05/15) Labile blood pressure  P:  EP and cardiology  following  Wean off levophed, goal MAP >65 Continue IV heparin per pharmacy Monitor INR Start IV lasix 80 mg BID (net +1.5) Holding amiodarone   RENAL A:  Hyponatremia - improved  Hypokalemia  P:   Monitor BMP and UOP Replete K  GASTROINTESTINAL A: Elevated AST/ALT in setting of ischemia - improving  Hyperbilirubinemia  Nutrition  GI PPx P:   Monitor CMP Continue tube feeds IV protonix 40 mg daily  HEMATOLOGIC A:  Chronic AC therapy in setting of MVR and afib  Supratheraputeuic INR with no bleeding - resolved  Acute normocytic anemia - no active bleeding P:  Monitor heparin levels  Monitor CBC  Monitor INR goal 2-3   INFECTIOUS A:  Aspiration pneumonia  Leukocytosis - resolved  P:   IV unasyn Day 5/7  Follow-up blood cultures  Follow-up HIV and HCV  ENDOCRINE A:  Elevated fasting glucose  Prediabetes - A1c 5.8 on 07/17/15  P:   SSI insulin with goal CBG <180  NEUROLOGIC A:  Anoxic brain injury s/p cardiac arrest and warming with CPR 7-9 min - much improved Sedation on vent EEG with generalized slowing, CT head normal  P:   Neurology following  Wean fentanyl infusion and PRN for SBT RAAS goal 0    Andrick Rust  PGY-3 IMTS Pager [phone]     08/10/2015, 7:31 AM

## 2015-08-10 NOTE — Progress Notes (Addendum)
ANTICOAGULATION CONSULT NOTE - Initial Consult  Pharmacy Consult for heparin Indication: hx MVR  Allergies  Allergen Reactions  . Vicodin [Hydrocodone-Acetaminophen] Other (See Comments)    "she feels out of it", hallucinating  . Adhesive [Tape] Itching and Rash  . Silicone Itching and Rash    TAPE ALLERGY/EKG STICKER ALLERGY, Please use "paper" tape only    Patient Measurements: Height: 5\' 2"  (157.5 cm) Weight: 134 lb 11.2 oz (61.1 kg) IBW/kg (Calculated) : 50.1 Heparin Dosing Weight: 61.8  Vital Signs: Temp: 98.6 F (37 C) (01/22 0700) Temp Source: Core (Comment) (01/22 0700) BP: 155/71 mmHg (01/22 0900) Pulse Rate: 95 (01/22 0900)  Labs:  Recent Labs  08/08/15 0410 08/08/15 1649 08/09/15 0430 08/09/15 2125 08/10/15 0420 08/10/15 0640  HGB 10.4*  --  9.0*  --  9.5*  --   HCT 31.0*  --  26.0*  --  27.8*  --   PLT 319  --  244  --  256  --   LABPROT 72.2* 18.4* 17.4*  --   --   --   INR 9.40* 1.53* 1.41  --   --   --   HEPARINUNFRC  --   --   --  <0.10*  --  0.55  CREATININE 0.91  --  0.76  --  0.60  --     Estimated Creatinine Clearance: 67.6 mL/min (by C-G formula based on Cr of 0.6).   Medical History: Past Medical History  Diagnosis Date  . Shortness of breath 05/29/2015  . Severe mitral regurgitation 07/03/2015  . Chronic combined systolic and diastolic CHF (congestive heart failure) (HCC) 07/03/2015    Grade 3 diastolic dysfunction.  LVEF 40-45% 05/2015.  Marland Kitchen Aortic aneurysm (HCC)   . Patent ductus arteriosus 07/16/2015  . Depression     SOME DEPRESSION (ON MEDS FOR 2 YRS) WHEN HER MOTHER PASSED  . 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 ampicillin-sulbactam (UNASYN) IV  1.5 g Intravenous Q6H  . antiseptic oral rinse  7 mL Mouth Rinse 10 times per day  . chlorhexidine  gluconate  15 mL Mouth Rinse BID  . furosemide  80 mg Intravenous BID  . insulin aspart  0-9 Units Subcutaneous 6 times per day  . pantoprazole (PROTONIX) IV  40 mg Intravenous QHS  . potassium chloride  10 mEq Intravenous Q1 Hr x 4  . sodium chloride  10-40 mL Intracatheter Q12H    Assessment: 55 yo woman admitted 08/05/2015 for PEA arrest and possible aspiration pna. Pt on warfarin PTA for MVR currently on hold. Pharmacy consulted to dose heparin.  PMH severe MR s/p MVR 07/2015, NICM (EF 50-55%), depression, AFib  HL 0.55 therapeutic on heparin 1150 units/h. Hgb 9.5, plt wnl. No noted bleeding.  Goal of Therapy:  Heparin level 0.3-0.7 units/ml Monitor platelets by anticoagulation protocol: Yes   Plan:  Heparin 1150 units/h Daily HL/CBC Monitor s/sx bleeding   Hillery Aldo, Pharm.D. PGY2 Cardiology Pharmacy Resident Pager: [phone]  08/10/2015,10:42 AM

## 2015-08-10 NOTE — Progress Notes (Signed)
Pt vomited large amount of tube feeding ( on vent). Rn was in room and immediately able to suction.KUB ordered, tube feeds on hold til read KUB. Able to auscultate sounds of OG in correct place. Tammy Sours

## 2015-08-10 NOTE — Progress Notes (Addendum)
   Repeat cxr with large L > R pleural effusion despite diuresis since AM  Plan - rest back on vent  - no extubation 08/10/2015 -patient and family informed - reassess 08/11/15   Note: patient sister Dr Cardell Peach wants to talk to an EP Doc -    Dr. Kalman Shan, M.D., [facility].C.P Pulmonary and Critical Care Medicine Staff Physician [facility] Pager: [phone], If no answer or between  15:00h - 7:00h: call [phone]  08/10/2015 1:47 PM

## 2015-08-10 NOTE — Progress Notes (Signed)
SUBJECTIVE: The patient is doing better today.  She went into sinus rhythm and her rate is now stable in the 80s.  She is more responsive than yesterday both nodding and shaking her head with movements in her hands when asked questions.     Marland Kitchen ampicillin-sulbactam (UNASYN) IV  1.5 g Intravenous Q6H  . antiseptic oral rinse  7 mL Mouth Rinse 10 times per day  . chlorhexidine gluconate  15 mL Mouth Rinse BID  . furosemide  80 mg Intravenous BID  . insulin aspart  0-9 Units Subcutaneous 6 times per day  . pantoprazole (PROTONIX) IV  40 mg Intravenous QHS  . potassium chloride  10 mEq Intravenous Q1 Hr x 4  . sodium chloride  10-40 mL Intracatheter Q12H   . feeding supplement (VITAL AF 1.2 CAL) 1,000 mL (08/10/15 0300)  . heparin 1,150 Units/hr (08/09/15 2317)  . norepinephrine (LEVOPHED) Adult infusion 2 mcg/min (08/09/15 1900)  . phenylephrine (NEO-SYNEPHRINE) Adult infusion Stopped (08/09/15 0818)    OBJECTIVE: Physical Exam: Filed Vitals:   08/10/15 0752 08/10/15 0800 08/10/15 0810 08/10/15 0900  BP:    155/71  Pulse: 91 89 66 95  Temp:      TempSrc:      Resp: 16 16 16 22   Height:      Weight:      SpO2: 100% 100% 100% 100%    Intake/Output Summary (Last 24 hours) at 08/10/15 0902 Last data filed at 08/10/15 0800  Gross per 24 hour  Intake 1355.51 ml  Output    950 ml  Net 405.51 ml    Telemetry reveals sinus rhythm  GEN- following some commands, nodding head when asked questions   Head- normocephalic, atraumatic Eyes-  Sclera clear, conjunctiva pink Ears- hearing intact Oropharynx- ET tube in place Neck- supple, no JVP Lymph- no cervical lymphadenopathy Lungs- Clear to ausculation bilaterally, normal work of breathing Heart- Regular rate and rhythm, no murmurs, rubs or gallops, PMI not laterally displaced GI- soft, NT, ND, + BS Extremities- no clubbing, cyanosis, 2 edema in hands and legs    LABS: Basic Metabolic Panel:  Recent Labs  16/10/96 0430  08/10/15 0420  NA 132* 132*  K 3.9 3.5  CL 100* 100*  CO2 23 23  GLUCOSE 88 121*  BUN 7 12  CREATININE 0.76 0.60  CALCIUM 8.2* 8.1*  MG 2.1 2.0  PHOS 3.1 3.2   Liver Function Tests:  Recent Labs  08/09/15 0430 08/10/15 0420  AST 564* 202*  ALT 551* 364*  ALKPHOS 97 101  BILITOT 1.5* 1.7*  PROT 5.1* 5.2*  ALBUMIN 2.4* 2.2*   No results for input(s): LIPASE, AMYLASE in the last 72 hours. CBC:  Recent Labs  08/08/15 0410 08/09/15 0430 08/10/15 0420  WBC 12.8* 9.7 6.9  NEUTROABS 10.7*  --   --   HGB 10.4* 9.0* 9.5*  HCT 31.0* 26.0* 27.8*  MCV 94.2 92.5 93.3  PLT 319 244 256   Cardiac Enzymes: No results for input(s): CKTOTAL, CKMB, CKMBINDEX, TROPONINI in the last 72 hours. BNP: Invalid input(s): POCBNP D-Dimer: No results for input(s): DDIMER in the last 72 hours. Hemoglobin A1C: No results for input(s): HGBA1C in the last 72 hours. Fasting Lipid Panel: No results for input(s): CHOL, HDL, LDLCALC, TRIG, CHOLHDL, LDLDIRECT in the last 72 hours. Thyroid Function Tests: No results for input(s): TSH, T4TOTAL, T3FREE, THYROIDAB in the last 72 hours.  Invalid input(s): FREET3 Anemia Panel: No results for input(s): VITAMINB12, FOLATE, FERRITIN,  TIBC, IRON, RETICCTPCT in the last 72 hours.  RADIOLOGY: Dg Chest 2 View  07/31/2015  CLINICAL DATA:  Worsening shortness of breath, cough, and peripheral edema. 9 days postop from mitral valve replacement. EXAM: CHEST  2 VIEW COMPARISON:  07/25/2015 FINDINGS: Increased moderate left pleural effusion is seen. Small right pleural effusion shows no significant change. There is increased atelectasis or infiltrate in the left lower lung. No pneumothorax visualized. Previous median sternotomy and mitral valve replacement noted. IMPRESSION: Increased moderate left pleural effusion and left lower lobe atelectasis versus infiltrate. No significant change in small right pleural effusion. Electronically Signed   By: Myles Rosenthal M.D.    On: 07/31/2015 15:51   Dg Chest 2 View  07/25/2015  CLINICAL DATA:  Atelectasis, shortness of breath, CHF, status post mitral valve repair and ligation of PDA EXAM: CHEST  2 VIEW COMPARISON:  Portable chest x-ray of July 24, 2015 FINDINGS: The left lung is well-expanded. There is a tiny pleural effusion on the left inferiorly. On the right there is a small pleural effusion and mild basilar atelectasis. The heart is top-normal in size. The pulmonary vascularity is normal. A left atrial appendage clip and a prosthetic mitral valve ring are visible. There are 8 intact sternal wires. The mediastinum is normal in width. There is no pneumothorax. IMPRESSION: Improved aeration and decreasing small bilateral pleural effusions. There is no pulmonary edema. Electronically Signed   By: David  [place] M.D.   On: 07/25/2015 07:44   Dg Chest 2 View  07/17/2015  CLINICAL DATA:  55 year old female preoperative study for cardiac surgery in January. Mitral regurgitation. Central chest pressure, nonproductive cough, shortness of breath. Initial encounter. EXAM: CHEST  2 VIEW COMPARISON:  CTA chest 07/16/2015 FINDINGS: Stable mild cardiomegaly. Other mediastinal contours are within normal limits. Visualized tracheal air column is within normal limits. No pneumothorax, pulmonary edema, pleural effusion or confluent pulmonary opacity. No acute osseous abnormality identified. IMPRESSION: Stable mild cardiomegaly.  No acute cardiopulmonary abnormality. Electronically Signed   By: Odessa Fleming M.D.   On: 07/17/2015 13:55   Ct Head Wo Contrast  08/07/2015  CLINICAL DATA:  Status post cardiac arrest. History of CHF, aortic aneurysm. EXAM: CT HEAD WITHOUT CONTRAST TECHNIQUE: Contiguous axial images were obtained from the base of the skull through the vertex without intravenous contrast. COMPARISON:  MRI of the brain January 14, 2015 FINDINGS: The ventricles and sulci are normal. No intraparenchymal hemorrhage, mass effect nor midline shift.  No acute large vascular territory infarcts. No abnormal extra-axial fluid collections. Basal cisterns are patent. No skull fracture. Subcentimeter calcification LEFT periorbital soft tissues. The included ocular globes and orbital contents are non-suspicious. The mastoid aircells and included paranasal sinuses are well-aerated. IMPRESSION: Negative CT head. Electronically Signed   By: Awilda Metro M.D.   On: 08/07/2015 23:36   Ct Angio Chest Pe W/cm &/or Wo Cm  08/05/2015  CLINICAL DATA:  55 year old female with shortness of breath. Status post CPR. EXAM: CT ANGIOGRAPHY CHEST WITH CONTRAST TECHNIQUE: Multidetector CT imaging of the chest was performed using the standard protocol during bolus administration of intravenous contrast. Multiplanar CT image reconstructions and MIPs were obtained to evaluate the vascular anatomy. CONTRAST:  80mL OMNIPAQUE IOHEXOL 350 MG/ML SOLN COMPARISON:  Radiograph dated 08/05/2015 FINDINGS: An endotracheal tube is noted with tip at the carina tilting towards the right mainstem bronchus. Recommend retraction and repositioning by approximately 3- 4 cm. Moderate bilateral pleural effusions. There is complete consolidation of the lower lobes bilaterally with air  bronchograms. There is partial consolidative changes of the left upper lobe and lingula. There is ground-glass opacity within the lingula. Findings may represent pneumonia, or pulmonary edema or ARDS. Clinical correlation is recommended. There is no pneumothorax. The visualized thoracic aorta appears unremarkable. No CT evidence of pulmonary embolism. Top-normal cardiac size. Mechanical mitral valve. Small pockets of air noted within the right atrium, likely iatrogenic from IV injection. There is retrograde flow of contrast from the right atrium into the IVC compatible with a degree of right cardiac dysfunction. No significant pericardial effusion. Small amount of fluid along the left aortic arch in the upper mediastinum  may represent complex fluid or small amount of blood/serosanguineous fluid. Small pockets of air noted within this fluid in the upper mediastinum compatible with small pneumomediastinum. An enteric tube is partially visualized extending into the upper abdomen with tip not included in the CT. There is no axillary adenopathy. There is diffuse subcutaneous soft tissue edema and anasarca. Mild degenerative changes of the spine. Median sternotomy wires age indeterminate fracture of the left first rib, new from prior study possibly acute or subacute. No other acute fracture identified. Review of the MIP images confirms the above findings. IMPRESSION: No CT evidence of pulmonary embolism. Moderate bilateral pleural effusions with complete consolidative changes of the lower lobes and partial consolidative changes of the left upper lobe. Clinical correlation is recommended. Endotracheal tube the tip at the origin of the right mainstem bronchus. Recommend retraction and repositioning by approximately 3-4 cm. Small high attenuating fluid along the left aspect of the aortic arch may represent complex fluid or small amount of blood. These results were called by telephone at the time of interpretation on 08/05/2015 at 10:59 pm to Dr. Marijean Niemann , who verbally acknowledged these results. Electronically Signed   By: Elgie Collard M.D.   On: 08/05/2015 23:04   Dg Chest Port 1 View  08/10/2015  CLINICAL DATA:  Pleural effusion. EXAM: PORTABLE CHEST 1 VIEW COMPARISON:  08/09/2015 FINDINGS: Endotracheal tube, enteric catheter, left IJ approach central venous catheter are stable. Median sternotomy wires also seen. Cardiomediastinal silhouette is partially obscured by dense lower lobe airspace opacities and bilateral pleural effusions. Reticular opacities are seen in the upper lobes. There is no evidence of focal airspace consolidation, pleural effusion or pneumothorax. Osseous structures are without acute abnormality. Soft  tissues are grossly normal. IMPRESSION: Pulmonary edema with bilateral pleural effusions. More confluent airspace consolidation in bilateral lung bases may represent areas of subsegmental atelectasis. Electronically Signed   By: Ted Mcalpine M.D.   On: 08/10/2015 08:47   Dg Chest Port 1 View  08/09/2015  CLINICAL DATA:  Check endotracheal tube placement EXAM: PORTABLE CHEST - 1 VIEW COMPARISON:  08/08/2015 FINDINGS: An endotracheal tube is again seen approximately 6 cm above the carina. A left jugular central line is noted at the cavoatrial junction. Nasogastric catheter extends into the stomach. Postsurgical changes are seen. Left-sided pleural effusion is again noted. Patchy infiltrates are again seen likely related to edema but stable. No new focal abnormality is noted. IMPRESSION: Tubes and lines as described. Left-sided pleural effusion and bilateral pulmonary edema stable in appearance. Electronically Signed   By: Alcide Clever M.D.   On: 08/09/2015 08:05   Dg Chest Port 1 View  08/08/2015  CLINICAL DATA:  Cardiac arrest EXAM: PORTABLE CHEST 1 VIEW COMPARISON:  08/07/2015 FINDINGS: Endotracheal tube in good position. Central venous catheter tip in the SVC unchanged. NG tube in the stomach. Moderately large left effusion  with left lower lobe airspace disease unchanged. Smaller right effusion with right lower lobe airspace disease unchanged. Diffuse bilateral airspace disease may represent edema and has progressed. IMPRESSION: Progressive bilateral airspace disease most likely due to edema. No change in bilateral pleural effusions left greater than right with compressive atelectasis in the bases. Endotracheal tube remains in good position. Electronically Signed   By: Marlan Palau M.D.   On: 08/08/2015 08:06   Dg Chest Port 1 View  08/07/2015  CLINICAL DATA:  Heart failure. EXAM: PORTABLE CHEST 1 VIEW COMPARISON:  08/05/2015. FINDINGS: Endotracheal tube, NG tube, left IJ line in stable position.  Prior cardiac valve replacement. Left atrial appendage clip noted. Partial clearing of bilateral pulmonary infiltrates/edema. Persistent but slightly improved bilateral pleural effusions. No pneumothorax . IMPRESSION: 1. Lines and tubes in stable position. 2. Partial clearing of bilateral pulmonary edema and pleural effusions. 3. Prior cardiac valve replacement.  Stable cardiomegaly. Electronically Signed   By: Maisie Fus  Register   On: 08/07/2015 07:32   Dg Chest Port 1 View  08/05/2015  CLINICAL DATA:  Central line placement.  Initial encounter. EXAM: PORTABLE CHEST 1 VIEW COMPARISON:  Chest radiograph performed earlier today at 7:10 p.m., and CTA of the chest performed earlier today at 10:15 p.m. FINDINGS: The left IJ line is noted ending about the distal SVC. The patient's endotracheal tube is noted ending 2-3 cm above the carina. An enteric tube is noted extending below the diaphragm. Vascular congestion is again noted. Note is made of small right and small to moderate left pleural effusions, and likely underlying pulmonary edema. No pneumothorax is seen. The cardiomediastinal silhouette is mildly enlarged. The patient is status post median sternotomy. A mitral valve replacement is noted. No acute osseous abnormalities are identified. IMPRESSION: 1. Left IJ line noted ending about the distal SVC. 2. Endotracheal tube noted ending 2-3 cm above the carina. 3. Vascular congestion and mild cardiomegaly. Small right and small to moderate left pleural effusions, with likely underlying pulmonary edema. Electronically Signed   By: Roanna Raider M.D.   On: 08/05/2015 23:50   Dg Chest Portable 1 View  08/05/2015  CLINICAL DATA:  Status post CPR. Endotracheal tube placement. Initial encounter. EXAM: PORTABLE CHEST 1 VIEW COMPARISON:  Chest radiograph performed 07/31/2015 FINDINGS: The patient's endotracheal tube is noted ending overlying the right mainstem bronchus, 1 cm below the carina. This should be retracted 4  cm. Diffuse bilateral airspace opacification is noted, with a small to moderate left-sided pleural effusion. This is concerning for pulmonary edema, though pneumonia cannot be excluded. No pneumothorax is seen. The cardiomediastinal silhouette is mildly enlarged. The patient is status post median sternotomy. External pacing pads are noted. No acute osseous abnormalities are seen. IMPRESSION: 1. Endotracheal tube noted ending overlying the right mainstem bronchus, 1 cm below the carina. This should be retracted 4 cm. 2. Diffuse bilateral airspace opacification, with a small to moderate left-sided pleural effusion. This is concerning for pulmonary edema, though pneumonia cannot be excluded. 3. Mild cardiomegaly. These results were called by telephone at the time of interpretation on 08/05/2015 at 9:20 pm to Nursing at the [facility], who verbally acknowledged these results. The endotracheal tube had already been repositioned. Electronically Signed   By: Roanna Raider M.D.   On: 08/05/2015 21:50   Dg Chest Port 1 View  07/24/2015  CLINICAL DATA:  Mitral valve repair. EXAM: PORTABLE CHEST 1 VIEW COMPARISON:  07/23/2015. FINDINGS: Interim removal of Swan-Ganz catheter. Right IJ sheath in good  anatomic position. Left chest tube in stable position. Mediastinal drainage catheters in stable position. Prior median sternotomy and cardiac valve replacement. Left atrial appendage clip noted. Cardiomegaly with with improvement of pulmonary interstitial prominence suggesting improving congestive heart failure. No prominent pleural effusion. No pneumothorax . IMPRESSION: 1. Interim removal Swan-Ganz catheter. Remaining lines and tubes including left chest tube in stable position. No pneumothorax. 2. Prior mitral valve replacement. Cardiomegaly with partial clearing of pulmonary interstitial edema. Electronically Signed   By: Maisie Fus  Register   On: 07/24/2015 07:58   Dg Chest Port 1 View  07/23/2015  CLINICAL DATA:  Status  post mitral valve repair, PDA ligation and clipping of left atrial appendage. EXAM: PORTABLE CHEST 1 VIEW COMPARISON:  07/22/2015 FINDINGS: Patient has been extubated. Swan-Ganz catheter tip positioning stable in the proximal right pulmonary artery. Mediastinal drain and left chest tube remain. No pneumothorax. Mild increase in bilateral pleural effusions and bibasilar atelectasis since the prior exam. Stable mild interstitial edema. IMPRESSION: Mild increase in bilateral pleural effusions and bibasilar atelectasis. Mild interstitial edema appears stable. No pneumothorax identified. Electronically Signed   By: Irish Lack M.D.   On: 07/23/2015 08:12   Dg Chest Port 1 View  07/22/2015  CLINICAL DATA:  Status post CABG, ETT, and OG tube. EXAM: PORTABLE CHEST 1 VIEW COMPARISON:  July 17, 2015 FINDINGS: The patient is status post CABG. The distal tip of the PA catheter terminates in the pulmonary outflow tract. The ETT terminates 4.2 cm above the carina. Two left-sided chest tubes and a mediastinal drain are both noted. The NG tube terminates with the distal tip near the GE junction and the side port 7.5 cm above the GE junction. The patient is status post aortic valve replacement. No pneumothorax. The heart, hila, and mediastinum are unchanged. Mild central interstitial prominence consistent with mild edema. There is a tiny amount of postoperative mediastinal air. No other acute abnormalities identified. IMPRESSION: 1. Postoperative changes in the chest with support apparatus as described above. Mild edema. 2. The side port of the NG tube is 7.5 cm above the GE junction. Recommend advancement as clinically warranted. Electronically Signed   By: Gerome Sam III M.D   On: 07/22/2015 14:01   Ct Angio Chest Aorta W/cm &/or Wo/cm  07/16/2015  CLINICAL DATA:  Shortness of breath, cough, preop for mitral valve replacement. EXAM: CT ANGIOGRAPHY CHEST, ABDOMEN AND PELVIS TECHNIQUE: Multidetector CT imaging  through the chest, abdomen and pelvis was performed using the standard protocol during bolus administration of intravenous contrast. Multiplanar reconstructed images and MIPs were obtained and reviewed to evaluate the vascular anatomy. CONTRAST:  75 mL of Isovue 370 intravenously. COMPARISON:  None. FINDINGS: CTA CHEST FINDINGS No pneumothorax or pleural effusion is noted. No acute pulmonary disease is noted. There is no evidence of thoracic aortic dissection or aneurysm. Great vessels are widely patent. The patient does appear to have a patent ductus arteriosus with jet seen extending into the main pulmonary artery. There is noted enlargement of the left atrium and left atrial appendage. No significant osseous abnormality is noted. No mediastinal mass or adenopathy is noted. Review of the MIP images confirms the above findings. CTA ABDOMEN AND PELVIS FINDINGS No gallstones are noted. The liver, spleen and pancreas are unremarkable. Adrenal glands and kidneys appear normal. No hydronephrosis or renal obstruction is noted. There is no evidence of abdominal aortic aneurysm or dissection. Mesenteric and renal arteries are widely patent. There is no evidence of bowel obstruction. The appendix appears  normal. Iliac arteries are widely patent without significant stenosis. Urinary bladder appears normal. Uterus appears normal. Small amount of free fluid is noted in the pelvis which most likely is physiologic. No significant adenopathy is noted. No significant osseous abnormality is noted in the abdomen or pelvis. Review of the MIP images confirms the above findings. IMPRESSION: No evidence of thoracic or abdominal aortic aneurysm or dissection. Enlargement of the left atrium and atrial appendage is noted consistent with mitral valve regurgitation. Patent ductus arteriosus is noted. These results will be called to the ordering clinician or representative by the Radiologist Assistant, and communication documented in the PACS  or zVision Dashboard. Electronically Signed   By: Lupita Raider, M.D.   On: 07/16/2015 15:33   Ct Angio Abd/pel W/ And/or W/o  07/16/2015  CLINICAL DATA:  Shortness of breath, cough, preop for mitral valve replacement. EXAM: CT ANGIOGRAPHY CHEST, ABDOMEN AND PELVIS TECHNIQUE: Multidetector CT imaging through the chest, abdomen and pelvis was performed using the standard protocol during bolus administration of intravenous contrast. Multiplanar reconstructed images and MIPs were obtained and reviewed to evaluate the vascular anatomy. CONTRAST:  75 mL of Isovue 370 intravenously. COMPARISON:  None. FINDINGS: CTA CHEST FINDINGS No pneumothorax or pleural effusion is noted. No acute pulmonary disease is noted. There is no evidence of thoracic aortic dissection or aneurysm. Great vessels are widely patent. The patient does appear to have a patent ductus arteriosus with jet seen extending into the main pulmonary artery. There is noted enlargement of the left atrium and left atrial appendage. No significant osseous abnormality is noted. No mediastinal mass or adenopathy is noted. Review of the MIP images confirms the above findings. CTA ABDOMEN AND PELVIS FINDINGS No gallstones are noted. The liver, spleen and pancreas are unremarkable. Adrenal glands and kidneys appear normal. No hydronephrosis or renal obstruction is noted. There is no evidence of abdominal aortic aneurysm or dissection. Mesenteric and renal arteries are widely patent. There is no evidence of bowel obstruction. The appendix appears normal. Iliac arteries are widely patent without significant stenosis. Urinary bladder appears normal. Uterus appears normal. Small amount of free fluid is noted in the pelvis which most likely is physiologic. No significant adenopathy is noted. No significant osseous abnormality is noted in the abdomen or pelvis. Review of the MIP images confirms the above findings. IMPRESSION: No evidence of thoracic or abdominal aortic  aneurysm or dissection. Enlargement of the left atrium and atrial appendage is noted consistent with mitral valve regurgitation. Patent ductus arteriosus is noted. These results will be called to the ordering clinician or representative by the Radiologist Assistant, and communication documented in the PACS or zVision Dashboard. Electronically Signed   By: Lupita Raider, M.D.   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  She was apparently found to be in asystole on EMS arrival but did have episodes of wide complex tachycardia.  She went into atrial fibrillation/flutter while here and was put on amiodarone but went into junctional rhythm and this was stopped.  She is now in  sinus rhythm, agree with holding amiodarone.  Anticoagulation can be started.  She is volume overloaded today which may complicate efforts to extubate.  Should her BP tolerate, would begin diuresis.  Will Jorja Loa, MD 08/10/2015 9:02 AM

## 2015-08-11 ENCOUNTER — Inpatient Hospital Stay (HOSPITAL_COMMUNITY): Payer: BLUE CROSS/BLUE SHIELD

## 2015-08-11 DIAGNOSIS — J90 Pleural effusion, not elsewhere classified: Secondary | ICD-10-CM

## 2015-08-11 LAB — HEPATITIS C ANTIBODY (REFLEX)

## 2015-08-11 LAB — PROTEIN, BODY FLUID: Total protein, fluid: 3.5 g/dL

## 2015-08-11 LAB — CBC
HCT: 29.5 % — ABNORMAL LOW (ref 36.0–46.0)
HEMOGLOBIN: 10.2 g/dL — AB (ref 12.0–15.0)
MCH: 31.8 pg (ref 26.0–34.0)
MCHC: 34.6 g/dL (ref 30.0–36.0)
MCV: 91.9 fL (ref 78.0–100.0)
Platelets: 277 10*3/uL (ref 150–400)
RBC: 3.21 MIL/uL — AB (ref 3.87–5.11)
RDW: 14.5 % (ref 11.5–15.5)
WBC: 7.2 10*3/uL (ref 4.0–10.5)

## 2015-08-11 LAB — COMPREHENSIVE METABOLIC PANEL
ALK PHOS: 106 U/L (ref 38–126)
ALT: 327 U/L — AB (ref 14–54)
AST: 149 U/L — ABNORMAL HIGH (ref 15–41)
Albumin: 2.7 g/dL — ABNORMAL LOW (ref 3.5–5.0)
Anion gap: 16 — ABNORMAL HIGH (ref 5–15)
BILIRUBIN TOTAL: 1.8 mg/dL — AB (ref 0.3–1.2)
BUN: 10 mg/dL (ref 6–20)
CALCIUM: 8.1 mg/dL — AB (ref 8.9–10.3)
CO2: 25 mmol/L (ref 22–32)
CREATININE: 0.73 mg/dL (ref 0.44–1.00)
Chloride: 92 mmol/L — ABNORMAL LOW (ref 101–111)
Glucose, Bld: 105 mg/dL — ABNORMAL HIGH (ref 65–99)
Potassium: 3 mmol/L — ABNORMAL LOW (ref 3.5–5.1)
Sodium: 133 mmol/L — ABNORMAL LOW (ref 135–145)
TOTAL PROTEIN: 6.5 g/dL (ref 6.5–8.1)

## 2015-08-11 LAB — CULTURE, BLOOD (ROUTINE X 2)
Culture: NO GROWTH
Culture: NO GROWTH

## 2015-08-11 LAB — PHOSPHORUS: PHOSPHORUS: 3.1 mg/dL (ref 2.5–4.6)

## 2015-08-11 LAB — GLUCOSE, CAPILLARY
GLUCOSE-CAPILLARY: 94 mg/dL (ref 65–99)
GLUCOSE-CAPILLARY: 102 mg/dL — AB (ref 65–99)
Glucose-Capillary: 95 mg/dL (ref 65–99)
Glucose-Capillary: 98 mg/dL (ref 65–99)
Glucose-Capillary: 107 mg/dL — ABNORMAL HIGH (ref 65–99)

## 2015-08-11 LAB — HCV COMMENT:

## 2015-08-11 LAB — BODY FLUID CELL COUNT WITH DIFFERENTIAL
EOS FL: 0 %
Lymphs, Fluid: 47 %
Monocyte-Macrophage-Serous Fluid: 5 % — ABNORMAL LOW (ref 50–90)
Neutrophil Count, Fluid: 48 % — ABNORMAL HIGH (ref 0–25)
OTHER CELLS FL: 0 %
Total Nucleated Cell Count, Fluid: 1270 cu mm — ABNORMAL HIGH (ref 0–1000)

## 2015-08-11 LAB — LACTATE DEHYDROGENASE, PLEURAL OR PERITONEAL FLUID: LD FL: 245 U/L — AB (ref 3–23)

## 2015-08-11 LAB — LACTATE DEHYDROGENASE: LDH: 256 U/L — ABNORMAL HIGH (ref 98–192)

## 2015-08-11 LAB — HEMATOCRIT, BODY FLUID: Hematocrit, Fluid: <2 %

## 2015-08-11 LAB — MAGNESIUM: MAGNESIUM: 1.9 mg/dL (ref 1.7–2.4)

## 2015-08-11 LAB — PROCALCITONIN: Procalcitonin: 0.11 ng/mL

## 2015-08-11 LAB — PROTEIN, TOTAL: Total Protein: 6.1 g/dL — ABNORMAL LOW (ref 6.5–8.1)

## 2015-08-11 LAB — CHOLESTEROL, TOTAL: Cholesterol: 161 mg/dL (ref 0–200)

## 2015-08-11 MED ORDER — MAGNESIUM SULFATE 2 GM/50ML IV SOLN
2.0000 g | Freq: Once | INTRAVENOUS | Status: AC
  Administered 2015-08-11: 2 g via INTRAVENOUS
  Filled 2015-08-11: qty 50

## 2015-08-11 MED ORDER — POTASSIUM CHLORIDE 10 MEQ/50ML IV SOLN
10.0000 meq | INTRAVENOUS | Status: AC
  Administered 2015-08-11 (×4): 10 meq via INTRAVENOUS
  Filled 2015-08-11 (×4): qty 50

## 2015-08-11 MED ORDER — FENTANYL CITRATE (PF) 100 MCG/2ML IJ SOLN
INTRAMUSCULAR | Status: AC
  Filled 2015-08-11: qty 2

## 2015-08-11 MED ORDER — POTASSIUM CHLORIDE 20 MEQ/15ML (10%) PO SOLN
40.0000 meq | Freq: Once | ORAL | Status: AC
  Administered 2015-08-11: 40 meq via ORAL
  Filled 2015-08-11: qty 30

## 2015-08-11 MED ORDER — FENTANYL CITRATE (PF) 100 MCG/2ML IJ SOLN
25.0000 ug | INTRAMUSCULAR | Status: DC | PRN
  Administered 2015-08-11 – 2015-08-12 (×2): 25 ug via INTRAVENOUS
  Filled 2015-08-11: qty 2

## 2015-08-11 MED ORDER — METHYLPREDNISOLONE SODIUM SUCC 40 MG IJ SOLR
40.0000 mg | Freq: Two times a day (BID) | INTRAMUSCULAR | Status: DC
  Administered 2015-08-11 – 2015-08-13 (×4): 40 mg via INTRAVENOUS
  Filled 2015-08-11 (×4): qty 1

## 2015-08-11 NOTE — Progress Notes (Signed)
[facility] ADULT ICU REPLACEMENT PROTOCOL FOR AM LAB REPLACEMENT ONLY  The patient does apply for the [facility] Adult ICU Electrolyte Replacment Protocol based on the criteria listed below:   1. Is GFR >/= 40 ml/min? Yes.    Patient's GFR today is >60 2. Is urine output >/= 0.5 ml/kg/hr for the last 6 hours? Yes.   Patient's UOP is 1.96 ml/kg/hr 3. Is BUN < 60 mg/dL? Yes.    Patient's BUN today is 10 4. Abnormal electrolyte  K 3.0 5. Ordered repletion with: per protocol 6. If a panic level lab has been reported, has the CCM MD in charge been notified? Yes.  .   Physician:  Tonny Branch, Lang Snow 08/11/2015 5:07 AM

## 2015-08-11 NOTE — Procedures (Signed)
Thoracentesis Procedure Note  Pre-operative Diagnosis: large left effusion in setting resp failure  Post-operative Diagnosis: large hemorrhagic effusion drained, s/p mitral repair  Indications: effusion, poor weaning, resp failure  Procedure Details  Consent: Informed consent was obtained. Risks of the procedure were discussed including: infection, bleeding, pain, pneumothorax.  Under sterile conditions the patient was positioned. Betadine solution and sterile drapes were utilized.  1% buffered lidocaine was used to anesthetize the 5th rib space. Fluid was obtained without any difficulties and minimal blood loss.  A dressing was applied to the wound and wound care instructions were provided.   Findings 950 ml of semi hemorrhagic, [place] pleural fluid was obtained. A sample was sent to Pathology for cytogenetics, flow, and cell counts, as well as for infection analysis.  Complications:  None; patient tolerated the procedure well.          Condition: stable  Plan A follow up chest x-ray was ordered. Bed Rest for 4 hours. Tylenol 650 mg. for pain.  Attending Attestation: I performed the procedure.   US guidance  Mcarthur Rossetti. Tyson Alias, MD, FACP Pgr: [phone] [facility]

## 2015-08-11 NOTE — Procedures (Signed)
[place] chest  1. Large free flowing left effusion, significant lung ATX / flap (will thora) 2. One isolated mid lung rt effusion pocket, lung above and below (doubt contributing to weaning issues)  Mcarthur Rossetti. Tyson Alias, MD, FACP Pgr: [phone] [facility]

## 2015-08-11 NOTE — Progress Notes (Addendum)
PULMONARY / CRITICAL CARE MEDICINE   Name: Kisty Kaman MRN: [id number] DOB: [birth year]    ADMISSION DATE:  08/05/2015 CONSULTATION DATE:  08/05/15  REFERRING MD :  EDP PRIMARY SERVICE: PCCM   BRIEF PATIENT DESCRIPTION: 54-yo F with MVR repair on 07/22/15 found to be in PEA/VT arrest with 3 shocks and ROSC after 7-9 minutes requiring hypothermic protocol.   SIGNIFICANT EVENTS / STUDIES:  1/17 Admitted for PEA/VT arrest, intubated, placed on hypothermic protocol   1/17 CTA chest with no PO, moderate bilateral pleural effusions and consolidative changes of lower lobes 1/17 Echo with normal EF, mild LVH, mild LAW, trace AI, mild MS, no MR, small pericardial effusion, left pleural effusion 1/18 EEG with generalized slowing  1/19 Rewarmed, paralytics, sedation turned off 1/19 CT head normal 1/20 CXR with left sided pleural effusion and b/l pulmonary edema  1/20 INR 9.4 requiring FFP and vitamin K 1/20 IV amio stopped  1/20 EEG with generalized slowing  1/21 Improved mental status, start IV heparin  1/22 Vomited tube feeds, KUB normal    LINES / TUBES: 1/17 ETT >> 1/17 Central line >> 1/18 OG >> 1/18 Foley >> 1/18 Aline >>>  CULTURES: 1/18 Blood >> 1/18 Tracheal >> normal flora 1/18 Urine >> 1/18 MRSA >> neg    ANTIBIOTICS: 1/18 Unasyn >>   SUBJECTIVE:  Pt still with labile blood pressure overnight and on 2 of levo with CVP 6.  Diuresed 5.8 L yesterday on IV lasix 80 mg BID with net negative 2.8 L since admission. CXR this AM slightly improved. Currently on vent with no sedation. Tube feeds on hold due to vomiting yesterday. KUB was normal.   VITAL SIGNS: Temp:  [98.3 F (36.8 C)-98.8 F (37.1 C)] 98.4 F (36.9 C) (01/23 0400) Pulse Rate:  [66-99] 95 (01/23 0600) Resp:  [15-23] 16 (01/23 0600) BP: (75-155)/(41-82) 127/64 mmHg (01/23 0600) SpO2:  [97 %-100 %] 100 % (01/23 0600) Arterial Line BP: (72-168)/(41-81) 160/67 mmHg (01/23 0600) FiO2 (%):  [30 %-40 %] 30 %  (01/23 0400) Weight:  [122 lb 9.2 oz (55.6 kg)] 122 lb 9.2 oz (55.6 kg) (01/23 0416) HEMODYNAMICS: CVP:  [6 mmHg-13 mmHg] 6 mmHg VENTILATOR SETTINGS: Vent Mode:  [-] PRVC FiO2 (%):  [30 %-40 %] 30 % Set Rate:  [16 bmp] 16 bmp Vt Set:  [400 mL] 400 mL PEEP:  [5 cmH20] 5 cmH20 Pressure Support:  [5 cmH20-10 cmH20] 5 cmH20 Plateau Pressure:  [16 cmH20-23 cmH20] 17 cmH20 INTAKE / OUTPUT: Intake/Output      01/22 0701 - 01/23 0700 01/23 0701 - 01/24 0700   I.V. (mL/kg) 555.7 (10)    NG/GT 585    IV Piggyback 400    Total Intake(mL/kg) 1540.7 (27.7)    Urine (mL/kg/hr) 5850 (4.4)    Total Output 5850     Net -4309.3            PHYSICAL EXAMINATION: General:  NAD, intubated on vent Neuro:  Alert, nods to questions, follows commands HEENT:  OG, normocephalic  Cardiovascular:  Normal rate and rhythm  Lungs:  Ronchi/coarse BS in anterior fields  Abdomen: Arctic pads on, non-tender, nondistended, decreased BS Musculoskeletal:  SCD's on, trace pedal edema Skin:  No rashes  LABS:  CBC  Recent Labs Lab 08/09/15 0430 08/10/15 0420 08/11/15 0400  WBC 9.7 6.9 7.2  HGB 9.0* 9.5* 10.2*  HCT 26.0* 27.8* 29.5*  PLT 244 256 277   Coag's  Recent Labs Lab 08/05/15 2351 08/06/15 5573  08/08/15 0410 08/08/15 1649 08/09/15 0430  APTT 56* 38*  --   --   --   --   INR 2.71* 2.54*  < > 9.40* 1.53* 1.41  < > = values in this interval not displayed. BMET  Recent Labs Lab 08/09/15 0430 08/10/15 0420 08/11/15 0400  NA 132* 132* 133*  K 3.9 3.5 3.0*  CL 100* 100* 92*  CO2 23 23 25   BUN 7 12 10   CREATININE 0.76 0.60 0.73  GLUCOSE 88 121* 105*   Electrolytes  Recent Labs Lab 08/09/15 0430 08/10/15 0420 08/11/15 0400  CALCIUM 8.2* 8.1* 8.1*  MG 2.1 2.0 1.9  PHOS 3.1 3.2 3.1   Sepsis Markers  Recent Labs Lab 08/05/15 2007 08/05/15 2135 08/11/15 0400  LATICACIDVEN 5.7* 5.4*  --   PROCALCITON  --   --  0.11   ABG  Recent Labs Lab 08/07/15 1132  08/08/15 0409 08/09/15 0310  PHART 7.444 7.379 7.499*  PCO2ART 26.6* 29.7* 29.2*  PO2ART 122.0* 133* 172*   Liver Enzymes  Recent Labs Lab 08/09/15 0430 08/10/15 0420 08/11/15 0400  AST 564* 202* 149*  ALT 551* 364* 327*  ALKPHOS 97 101 106  BILITOT 1.5* 1.7* 1.8*  ALBUMIN 2.4* 2.2* 2.7*   Cardiac Enzymes  Recent Labs Lab 08/06/15 0200 08/06/15 0400 08/06/15 0845  TROPONINI 0.52* 0.58* 0.65*   Glucose  Recent Labs Lab 08/10/15 0743 08/10/15 1155 08/10/15 1702 08/10/15 1954 08/10/15 2332 08/11/15 0405  GLUCAP 142* 157* 139* 74 99 95    Imaging Dg Chest Port 1 View  08/11/2015  CLINICAL DATA:  Ventilator.  Mitral valve repair. EXAM: PORTABLE CHEST 1 VIEW COMPARISON:  08/10/2015 FINDINGS: Changes of median sternotomy and valve replacement. Bilateral pleural effusions, left greater than right, likely slightly improved since prior study. Bilateral airspace opacities, most pronounced in the the lower lobes, also greater on the left with slight improvement in aeration since prior study. No pneumothorax. No acute bony abnormality. IMPRESSION: Moderate to large bilateral pleural effusions and bilateral airspace disease, both greater on the left. This is improved slightly since prior study. Support devices stable. Electronically Signed   By: Charlett Nose M.D.   On: 08/11/2015 07:12   Dg Chest Port 1 View  08/10/2015  CLINICAL DATA:  Endotracheally intubated. Pt currently on lasix and diuresed about 4 liters of fluid from chest. Checking on fluid to be able to extubate tomorrow. EXAM: PORTABLE CHEST 1 VIEW COMPARISON:  08/10/2015 FINDINGS: Lines and tubes unchanged. Large left pleural effusion unchanged. Moderate right pleural effusion unchanged. Vascular congestion stable. Bilateral airspace opacity presumably represent the compressive atelectasis as the result of the pleural fluid stable. IMPRESSION: Radiographically no change from prior study Electronically Signed   By: Esperanza Heir M.D.   On: 08/10/2015 14:12   Dg Chest Port 1 View  08/10/2015  CLINICAL DATA:  Pleural effusion. EXAM: PORTABLE CHEST 1 VIEW COMPARISON:  08/09/2015 FINDINGS: Endotracheal tube, enteric catheter, left IJ approach central venous catheter are stable. Median sternotomy wires also seen. Cardiomediastinal silhouette is partially obscured by dense lower lobe airspace opacities and bilateral pleural effusions. Reticular opacities are seen in the upper lobes. There is no evidence of focal airspace consolidation, pleural effusion or pneumothorax. Osseous structures are without acute abnormality. Soft tissues are grossly normal. IMPRESSION: Pulmonary edema with bilateral pleural effusions. More confluent airspace consolidation in bilateral lung bases may represent areas of subsegmental atelectasis. Electronically Signed   By: Ted Mcalpine M.D.   On: 08/10/2015  08:47   Dg Abd Portable 1v  08/10/2015  CLINICAL DATA:  Vomiting tube feedings, orogastric tube placement EXAM: PORTABLE ABDOMEN - 1 VIEW COMPARISON:  None. FINDINGS: Orogastric tube projects just to the right of midline over the anticipated position of the distal body of the stomach. Nonobstructive gas pattern. Rectal thermometer noted. IMPRESSION: OG tube as described Electronically Signed   By: Esperanza Heir M.D.   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) on  coumadin  Atrial fibrillation s/p LAA clipping (07/22/15) 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% on 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) Labile blood pressure  P:  EP and cardiology following  Try to wean off levophed, goal MAP >60 Continue IV heparin per pharmacy Monitor INR IV lasix 80 mg BID  Holding amiodarone   RENAL A:  Hyponatremia - 133 Hypokalemia - 3  P:   Monitor BMP and UOP Replete K lasix  GASTROINTESTINAL A: Emesis - KUB normal  Elevated AST/ALT in setting of ischemia - improving (shock liver) Hyperbilirubinemia  Nutrition  GI PPx P:   Monitor CMP Hold tube feeds for now, may extubate IV protonix 40 mg daily May need abdo [place]  HEMATOLOGIC A:  Chronic AC therapy in setting of MVR and afib  Supratheraputeuic INR with no bleeding - resolved  Acute normocytic anemia - no active bleeding P:  Monitor heparin levels  Monitor CBC  Monitor INR goal 2-3   INFECTIOUS A:  Aspiration pneumonia - PCT 0.11 (not impressed) Leukocytosis - resolved  P:   IV unasyn Day 6/7  Follow-up blood cultures  Follow-up HCV  ENDOCRINE A:  Elevated fasting glucose  Prediabetes - A1c 5.8 on 07/17/15  P:   SSI insulin with goal CBG <180  NEUROLOGIC A:  Anoxic brain injury s/p cardiac arrest and warming with CPR 7-9 min - much improved EEG with generalized slowing, CT head normal  P:   Not currently on sedation RAAS goal 0    Marjan Rabbani  PGY-3 IMTS Pager [phone]   08/11/2015, 7:44 AM   STAFF NOTE: I, Rory Percy, MD FACP have personally reviewed patient's available data, including medical history, events of note, physical examination and test results as part of my evaluation. I have discussed with resident/NP and other care providers such as pharmacist, RN and RRT. In addition, I personally evaluated patient and elicited key findings of: reduced BS left greater rt, CT initially reviewed without PNA had compressive ATX, EP on board, lasix started and was neg 4.2 liters, renal fxn  tolerated well, maintain lasix, repeat pcxr, weaning sBT cpap 5ps5 goal 2 hrs, would make a strong consideration for thoracentsis,last INR noted, on heparin drip, unimpressed PNA, unasyn to end in am , updated sister in full, assess rsbi on wean, i will [place] chest now and hold heparin The patient is critically ill with multiple organ systems failure and requires high complexity decision making for assessment and  support, frequent evaluation and titration of therapies, application of advanced monitoring technologies and extensive interpretation of multiple databases.   Critical Care Time devoted to patient care services described in this note is30 Minutes. This time reflects time of care of this signee: Rory Percy, MD FACP. This critical care time does not reflect procedure time, or teaching time or supervisory time of PA/NP/Med student/Med Resident etc but could involve care discussion time. Rest per NP/medical resident whose note is outlined above and that I agree with   Mcarthur Rossetti. Tyson Alias, MD, FACP Pgr: [phone] [facility] 08/11/2015 10:24 AM

## 2015-08-11 NOTE — Progress Notes (Signed)
Pharmacy Antibiotic Follow-up Note  Diane [name] is a 55 y.o. year-old female admitted on 08/05/2015.  The patient is currently on day 6 of Unasyn for possible asp PNA.  Assessment/Plan: WBC improved 7.2, afeb, Stop date of 1/24 adding for Unasyn 1.5 Q6h for a total of 7 days.   Temp (24hrs), Avg:98.3 F (36.8 C), Min:97.8 F (36.6 C), Max:98.8 F (37.1 C)   Recent Labs Lab 08/07/15 0751 08/08/15 0410 08/09/15 0430 08/10/15 0420 08/11/15 0400  WBC 19.7* 12.8* 9.7 6.9 7.2     Recent Labs Lab 08/07/15 1437 08/08/15 0410 08/09/15 0430 08/10/15 0420 08/11/15 0400  CREATININE 0.50 0.91 0.76 0.60 0.73   Estimated Creatinine Clearance: 62.1 mL/min (by C-G formula based on Cr of 0.73).    Allergies  Allergen Reactions  . Vicodin [Hydrocodone-Acetaminophen] Other (See Comments)    "she feels out of it", hallucinating  . Adhesive [Tape] Itching and Rash  . Silicone Itching and Rash    TAPE ALLERGY/EKG STICKER ALLERGY, Please use "paper" tape only    Antimicrobials this admission: Unasyn 1/17 >> (stop date of 1/24)  Levels/dose changes this admission: N/A  Microbiology results: 1/18: BCx >> ngtd 1/18 UCx >> ng final 1/18 trach asp >> normal flora final MRSA PCR neg  Thank you for allowing pharmacy to be a part of this patient's care.  Remi Haggard PharmD 08/11/2015 10:32 AM

## 2015-08-11 NOTE — Progress Notes (Signed)
eLink Physician-Brief Progress Note Patient Name: Diane [name] DOB: 13-Sep-1960 MRN: [id number]   Date of Service  08/11/2015  HPI/Events of Note  Pain  eICU Interventions  Fentanyl PRN     Intervention Category Minor Interventions: Other:  Keaun Schnabel 08/11/2015, 10:10 PM

## 2015-08-11 NOTE — Progress Notes (Signed)
SUBJECTIVE: The patient is doing better today.  She went into sinus rhythm and her rate is now stable in the 80s.  Did have junctional overnight.  She continues to nod her head to questions.    Marland Kitchen ampicillin-sulbactam (UNASYN) IV  1.5 g Intravenous Q6H  . antiseptic oral rinse  7 mL Mouth Rinse QID  . chlorhexidine gluconate  15 mL Mouth Rinse BID  . furosemide  80 mg Intravenous BID  . insulin aspart  0-9 Units Subcutaneous 6 times per day  . methylPREDNISolone (SOLU-MEDROL) injection  40 mg Intravenous Q12H  . pantoprazole (PROTONIX) IV  40 mg Intravenous QHS  . sodium chloride  10-40 mL Intracatheter Q12H   . norepinephrine (LEVOPHED) Adult infusion Stopped (08/11/15 1330)    OBJECTIVE: Physical Exam: Filed Vitals:   08/11/15 1200 08/11/15 1300 08/11/15 1322 08/11/15 1400  BP:   140/64   Pulse: 89 91 93 93  Temp:      TempSrc:      Resp: 18 18 17 13   Height:      Weight:      SpO2: 100% 100% 100% 100%    Intake/Output Summary (Last 24 hours) at 08/11/15 1538 Last data filed at 08/11/15 1400  Gross per 24 hour  Intake 1370.1 ml  Output   5100 ml  Net -3729.9 ml    Telemetry reveals sinus rhythm  GEN- following some commands, nodding head when asked questions   Head- normocephalic, atraumatic Eyes-  Sclera clear, conjunctiva pink Ears- hearing intact Oropharynx- ET tube in place Neck- supple, no JVP Lymph- no cervical lymphadenopathy Lungs- Clear to ausculation bilaterally, normal work of breathing Heart- Regular rate and rhythm, no murmurs, rubs or gallops, PMI not laterally displaced GI- soft, NT, ND, + BS Extremities- no clubbing, cyanosis, 2 edema in hands and legs    LABS: Basic Metabolic Panel:  Recent Labs  40/98/11 0420 08/11/15 0400  NA 132* 133*  K 3.5 3.0*  CL 100* 92*  CO2 23 25  GLUCOSE 121* 105*  BUN 12 10  CREATININE 0.60 0.73  CALCIUM 8.1* 8.1*  MG 2.0 1.9  PHOS 3.2 3.1   Liver Function Tests:  Recent Labs  08/10/15 0420  08/11/15 0400  AST 202* 149*  ALT 364* 327*  ALKPHOS 101 106  BILITOT 1.7* 1.8*  PROT 5.2* 6.5  ALBUMIN 2.2* 2.7*   No results for input(s): LIPASE, AMYLASE in the last 72 hours. CBC:  Recent Labs  08/10/15 0420 08/11/15 0400  WBC 6.9 7.2  HGB 9.5* 10.2*  HCT 27.8* 29.5*  MCV 93.3 91.9  PLT 256 277   Cardiac Enzymes: No results for input(s): CKTOTAL, CKMB, CKMBINDEX, TROPONINI in the last 72 hours. BNP: Invalid input(s): POCBNP D-Dimer: No results for input(s): DDIMER in the last 72 hours. Hemoglobin A1C: No results for input(s): HGBA1C in the last 72 hours. Fasting Lipid Panel: No results for input(s): CHOL, HDL, LDLCALC, TRIG, CHOLHDL, LDLDIRECT in the last 72 hours. Thyroid Function Tests: No results for input(s): TSH, T4TOTAL, T3FREE, THYROIDAB in the last 72 hours.  Invalid input(s): FREET3 Anemia Panel: No results for input(s): VITAMINB12, FOLATE, FERRITIN, TIBC, IRON, RETICCTPCT in the last 72 hours.  RADIOLOGY: Dg Chest 2 View  07/31/2015  CLINICAL DATA:  Worsening shortness of breath, cough, and peripheral edema. 9 days postop from mitral valve replacement. EXAM: CHEST  2 VIEW COMPARISON:  07/25/2015 FINDINGS: Increased moderate left pleural effusion is seen. Small right pleural effusion shows no significant change.  There is increased atelectasis or infiltrate in the left lower lung. No pneumothorax visualized. Previous median sternotomy and mitral valve replacement noted. IMPRESSION: Increased moderate left pleural effusion and left lower lobe atelectasis versus infiltrate. No significant change in small right pleural effusion. Electronically Signed   By: Myles Rosenthal M.D.   On: 07/31/2015 15:51   Dg Chest 2 View  07/25/2015  CLINICAL DATA:  Atelectasis, shortness of breath, CHF, status post mitral valve repair and ligation of PDA EXAM: CHEST  2 VIEW COMPARISON:  Portable chest x-ray of July 24, 2015 FINDINGS: The left lung is well-expanded. There is a tiny  pleural effusion on the left inferiorly. On the right there is a small pleural effusion and mild basilar atelectasis. The heart is top-normal in size. The pulmonary vascularity is normal. A left atrial appendage clip and a prosthetic mitral valve ring are visible. There are 8 intact sternal wires. The mediastinum is normal in width. There is no pneumothorax. IMPRESSION: Improved aeration and decreasing small bilateral pleural effusions. There is no pulmonary edema. Electronically Signed   By: David  [place] M.D.   On: 07/25/2015 07:44   Dg Chest 2 View  07/17/2015  CLINICAL DATA:  55 year old female preoperative study for cardiac surgery in January. Mitral regurgitation. Central chest pressure, nonproductive cough, shortness of breath. Initial encounter. EXAM: CHEST  2 VIEW COMPARISON:  CTA chest 07/16/2015 FINDINGS: Stable mild cardiomegaly. Other mediastinal contours are within normal limits. Visualized tracheal air column is within normal limits. No pneumothorax, pulmonary edema, pleural effusion or confluent pulmonary opacity. No acute osseous abnormality identified. IMPRESSION: Stable mild cardiomegaly.  No acute cardiopulmonary abnormality. Electronically Signed   By: Odessa Fleming M.D.   On: 07/17/2015 13:55   Ct Head Wo Contrast  08/07/2015  CLINICAL DATA:  Status post cardiac arrest. History of CHF, aortic aneurysm. EXAM: CT HEAD WITHOUT CONTRAST TECHNIQUE: Contiguous axial images were obtained from the base of the skull through the vertex without intravenous contrast. COMPARISON:  MRI of the brain January 14, 2015 FINDINGS: The ventricles and sulci are normal. No intraparenchymal hemorrhage, mass effect nor midline shift. No acute large vascular territory infarcts. No abnormal extra-axial fluid collections. Basal cisterns are patent. No skull fracture. Subcentimeter calcification LEFT periorbital soft tissues. The included ocular globes and orbital contents are non-suspicious. The mastoid aircells and  included paranasal sinuses are well-aerated. IMPRESSION: Negative CT head. Electronically Signed   By: Awilda Metro M.D.   On: 08/07/2015 23:36   Ct Angio Chest Pe W/cm &/or Wo Cm  08/05/2015  CLINICAL DATA:  55 year old female with shortness of breath. Status post CPR. EXAM: CT ANGIOGRAPHY CHEST WITH CONTRAST TECHNIQUE: Multidetector CT imaging of the chest was performed using the standard protocol during bolus administration of intravenous contrast. Multiplanar CT image reconstructions and MIPs were obtained to evaluate the vascular anatomy. CONTRAST:  80mL OMNIPAQUE IOHEXOL 350 MG/ML SOLN COMPARISON:  Radiograph dated 08/05/2015 FINDINGS: An endotracheal tube is noted with tip at the carina tilting towards the right mainstem bronchus. Recommend retraction and repositioning by approximately 3- 4 cm. Moderate bilateral pleural effusions. There is complete consolidation of the lower lobes bilaterally with air bronchograms. There is partial consolidative changes of the left upper lobe and lingula. There is ground-glass opacity within the lingula. Findings may represent pneumonia, or pulmonary edema or ARDS. Clinical correlation is recommended. There is no pneumothorax. The visualized thoracic aorta appears unremarkable. No CT evidence of pulmonary embolism. Top-normal cardiac size. Mechanical mitral valve. Small pockets of air  noted within the right atrium, likely iatrogenic from IV injection. There is retrograde flow of contrast from the right atrium into the IVC compatible with a degree of right cardiac dysfunction. No significant pericardial effusion. Small amount of fluid along the left aortic arch in the upper mediastinum may represent complex fluid or small amount of blood/serosanguineous fluid. Small pockets of air noted within this fluid in the upper mediastinum compatible with small pneumomediastinum. An enteric tube is partially visualized extending into the upper abdomen with tip not included in  the CT. There is no axillary adenopathy. There is diffuse subcutaneous soft tissue edema and anasarca. Mild degenerative changes of the spine. Median sternotomy wires age indeterminate fracture of the left first rib, new from prior study possibly acute or subacute. No other acute fracture identified. Review of the MIP images confirms the above findings. IMPRESSION: No CT evidence of pulmonary embolism. Moderate bilateral pleural effusions with complete consolidative changes of the lower lobes and partial consolidative changes of the left upper lobe. Clinical correlation is recommended. Endotracheal tube the tip at the origin of the right mainstem bronchus. Recommend retraction and repositioning by approximately 3-4 cm. Small high attenuating fluid along the left aspect of the aortic arch may represent complex fluid or small amount of blood. These results were called by telephone at the time of interpretation on 08/05/2015 at 10:59 pm to Dr. Marijean Niemann , who verbally acknowledged these results. Electronically Signed   By: Elgie Collard M.D.   On: 08/05/2015 23:04   Dg Chest Port 1 View  08/11/2015  CLINICAL DATA:  Status post left-sided thoracentesis EXAM: PORTABLE CHEST 1 VIEW COMPARISON:  Study obtained earlier in the day FINDINGS: Left effusion is smaller following thoracentesis. No pneumothorax. Endotracheal tube tip is 3.6 cm above the carina. Central catheter tip is in the superior vena cava. Nasogastric tube tip and side port are below the diaphragm in the stomach. There remains a fairly small left effusion. There is also a small right effusion. There is moderate atelectatic change in the left base and medial right base regions. Heart is mildly enlarged. Patient has a left atrial appendage clamp. The patient is status post mitral valve replacement. IMPRESSION: Left effusion smaller following thoracentesis. No pneumothorax. There is a small left effusion with small right effusion is well. Bibasilar  atelectatic change, more on the left than on the right. Stable cardiac silhouette. Tube and catheter positions as described without pneumothorax. Electronically Signed   By: Bretta Bang III M.D.   On: 08/11/2015 13:02   Dg Chest Port 1 View  08/11/2015  CLINICAL DATA:  Ventilator.  Mitral valve repair. EXAM: PORTABLE CHEST 1 VIEW COMPARISON:  08/10/2015 FINDINGS: Changes of median sternotomy and valve replacement. Bilateral pleural effusions, left greater than right, likely slightly improved since prior study. Bilateral airspace opacities, most pronounced in the the lower lobes, also greater on the left with slight improvement in aeration since prior study. No pneumothorax. No acute bony abnormality. IMPRESSION: Moderate to large bilateral pleural effusions and bilateral airspace disease, both greater on the left. This is improved slightly since prior study. Support devices stable. Electronically Signed   By: Charlett Nose M.D.   On: 08/11/2015 07:12   Dg Chest Port 1 View  08/10/2015  CLINICAL DATA:  Endotracheally intubated. Pt currently on lasix and diuresed about 4 liters of fluid from chest. Checking on fluid to be able to extubate tomorrow. EXAM: PORTABLE CHEST 1 VIEW COMPARISON:  08/10/2015 FINDINGS: Lines and tubes unchanged.  Large left pleural effusion unchanged. Moderate right pleural effusion unchanged. Vascular congestion stable. Bilateral airspace opacity presumably represent the compressive atelectasis as the result of the pleural fluid stable. IMPRESSION: Radiographically no change from prior study Electronically Signed   By: Esperanza Heir M.D.   On: 08/10/2015 14:12   Dg Chest Port 1 View  08/10/2015  CLINICAL DATA:  Pleural effusion. EXAM: PORTABLE CHEST 1 VIEW COMPARISON:  08/09/2015 FINDINGS: Endotracheal tube, enteric catheter, left IJ approach central venous catheter are stable. Median sternotomy wires also seen. Cardiomediastinal silhouette is partially obscured by dense lower  lobe airspace opacities and bilateral pleural effusions. Reticular opacities are seen in the upper lobes. There is no evidence of focal airspace consolidation, pleural effusion or pneumothorax. Osseous structures are without acute abnormality. Soft tissues are grossly normal. IMPRESSION: Pulmonary edema with bilateral pleural effusions. More confluent airspace consolidation in bilateral lung bases may represent areas of subsegmental atelectasis. Electronically Signed   By: Ted Mcalpine M.D.   On: 08/10/2015 08:47   Dg Chest Port 1 View  08/09/2015  CLINICAL DATA:  Check endotracheal tube placement EXAM: PORTABLE CHEST - 1 VIEW COMPARISON:  08/08/2015 FINDINGS: An endotracheal tube is again seen approximately 6 cm above the carina. A left jugular central line is noted at the cavoatrial junction. Nasogastric catheter extends into the stomach. Postsurgical changes are seen. Left-sided pleural effusion is again noted. Patchy infiltrates are again seen likely related to edema but stable. No new focal abnormality is noted. IMPRESSION: Tubes and lines as described. Left-sided pleural effusion and bilateral pulmonary edema stable in appearance. Electronically Signed   By: Alcide Clever M.D.   On: 08/09/2015 08:05   Dg Chest Port 1 View  08/08/2015  CLINICAL DATA:  Cardiac arrest EXAM: PORTABLE CHEST 1 VIEW COMPARISON:  08/07/2015 FINDINGS: Endotracheal tube in good position. Central venous catheter tip in the SVC unchanged. NG tube in the stomach. Moderately large left effusion with left lower lobe airspace disease unchanged. Smaller right effusion with right lower lobe airspace disease unchanged. Diffuse bilateral airspace disease may represent edema and has progressed. IMPRESSION: Progressive bilateral airspace disease most likely due to edema. No change in bilateral pleural effusions left greater than right with compressive atelectasis in the bases. Endotracheal tube remains in good position. Electronically  Signed   By: Marlan Palau M.D.   On: 08/08/2015 08:06   Dg Chest Port 1 View  08/07/2015  CLINICAL DATA:  Heart failure. EXAM: PORTABLE CHEST 1 VIEW COMPARISON:  08/05/2015. FINDINGS: Endotracheal tube, NG tube, left IJ line in stable position. Prior cardiac valve replacement. Left atrial appendage clip noted. Partial clearing of bilateral pulmonary infiltrates/edema. Persistent but slightly improved bilateral pleural effusions. No pneumothorax . IMPRESSION: 1. Lines and tubes in stable position. 2. Partial clearing of bilateral pulmonary edema and pleural effusions. 3. Prior cardiac valve replacement.  Stable cardiomegaly. Electronically Signed   By: Maisie Fus  Register   On: 08/07/2015 07:32   Dg Chest Port 1 View  08/05/2015  CLINICAL DATA:  Central line placement.  Initial encounter. EXAM: PORTABLE CHEST 1 VIEW COMPARISON:  Chest radiograph performed earlier today at 7:10 p.m., and CTA of the chest performed earlier today at 10:15 p.m. FINDINGS: The left IJ line is noted ending about the distal SVC. The patient's endotracheal tube is noted ending 2-3 cm above the carina. An enteric tube is noted extending below the diaphragm. Vascular congestion is again noted. Note is made of small right and small to moderate left pleural effusions,  and likely underlying pulmonary edema. No pneumothorax is seen. The cardiomediastinal silhouette is mildly enlarged. The patient is status post median sternotomy. A mitral valve replacement is noted. No acute osseous abnormalities are identified. IMPRESSION: 1. Left IJ line noted ending about the distal SVC. 2. Endotracheal tube noted ending 2-3 cm above the carina. 3. Vascular congestion and mild cardiomegaly. Small right and small to moderate left pleural effusions, with likely underlying pulmonary edema. Electronically Signed   By: Roanna Raider M.D.   On: 08/05/2015 23:50   Dg Chest Portable 1 View  08/05/2015  CLINICAL DATA:  Status post CPR. Endotracheal tube  placement. Initial encounter. EXAM: PORTABLE CHEST 1 VIEW COMPARISON:  Chest radiograph performed 07/31/2015 FINDINGS: The patient's endotracheal tube is noted ending overlying the right mainstem bronchus, 1 cm below the carina. This should be retracted 4 cm. Diffuse bilateral airspace opacification is noted, with a small to moderate left-sided pleural effusion. This is concerning for pulmonary edema, though pneumonia cannot be excluded. No pneumothorax is seen. The cardiomediastinal silhouette is mildly enlarged. The patient is status post median sternotomy. External pacing pads are noted. No acute osseous abnormalities are seen. IMPRESSION: 1. Endotracheal tube noted ending overlying the right mainstem bronchus, 1 cm below the carina. This should be retracted 4 cm. 2. Diffuse bilateral airspace opacification, with a small to moderate left-sided pleural effusion. This is concerning for pulmonary edema, though pneumonia cannot be excluded. 3. Mild cardiomegaly. These results were called by telephone at the time of interpretation on 08/05/2015 at 9:20 pm to Nursing at the [facility] ER, who verbally acknowledged these results. The endotracheal tube had already been repositioned. Electronically Signed   By: Roanna Raider M.D.   On: 08/05/2015 21:50   Dg Chest Port 1 View  07/24/2015  CLINICAL DATA:  Mitral valve repair. EXAM: PORTABLE CHEST 1 VIEW COMPARISON:  07/23/2015. FINDINGS: Interim removal of Swan-Ganz catheter. Right IJ sheath in good anatomic position. Left chest tube in stable position. Mediastinal drainage catheters in stable position. Prior median sternotomy and cardiac valve replacement. Left atrial appendage clip noted. Cardiomegaly with with improvement of pulmonary interstitial prominence suggesting improving congestive heart failure. No prominent pleural effusion. No pneumothorax . IMPRESSION: 1. Interim removal Swan-Ganz catheter. Remaining lines and tubes including left chest tube in stable  position. No pneumothorax. 2. Prior mitral valve replacement. Cardiomegaly with partial clearing of pulmonary interstitial edema. Electronically Signed   By: Maisie Fus  Register   On: 07/24/2015 07:58   Dg Chest Port 1 View  07/23/2015  CLINICAL DATA:  Status post mitral valve repair, PDA ligation and clipping of left atrial appendage. EXAM: PORTABLE CHEST 1 VIEW COMPARISON:  07/22/2015 FINDINGS: Patient has been extubated. Swan-Ganz catheter tip positioning stable in the proximal right pulmonary artery. Mediastinal drain and left chest tube remain. No pneumothorax. Mild increase in bilateral pleural effusions and bibasilar atelectasis since the prior exam. Stable mild interstitial edema. IMPRESSION: Mild increase in bilateral pleural effusions and bibasilar atelectasis. Mild interstitial edema appears stable. No pneumothorax identified. Electronically Signed   By: Irish Lack M.D.   On: 07/23/2015 08:12   Dg Chest Port 1 View  07/22/2015  CLINICAL DATA:  Status post CABG, ETT, and OG tube. EXAM: PORTABLE CHEST 1 VIEW COMPARISON:  July 17, 2015 FINDINGS: The patient is status post CABG. The distal tip of the PA catheter terminates in the pulmonary outflow tract. The ETT terminates 4.2 cm above the carina. Two left-sided chest tubes and a mediastinal drain are both  noted. The NG tube terminates with the distal tip near the GE junction and the side port 7.5 cm above the GE junction. The patient is status post aortic valve replacement. No pneumothorax. The heart, hila, and mediastinum are unchanged. Mild central interstitial prominence consistent with mild edema. There is a tiny amount of postoperative mediastinal air. No other acute abnormalities identified. IMPRESSION: 1. Postoperative changes in the chest with support apparatus as described above. Mild edema. 2. The side port of the NG tube is 7.5 cm above the GE junction. Recommend advancement as clinically warranted. Electronically Signed   By: Gerome Sam III M.D   On: 07/22/2015 14:01   Dg Abd Portable 1v  08/10/2015  CLINICAL DATA:  Vomiting tube feedings, orogastric tube placement EXAM: PORTABLE ABDOMEN - 1 VIEW COMPARISON:  None. FINDINGS: Orogastric tube projects just to the right of midline over the anticipated position of the distal body of the stomach. Nonobstructive gas pattern. Rectal thermometer noted. IMPRESSION: OG tube as described Electronically Signed   By: Esperanza Heir M.D.   On: 08/10/2015 17:43   Ct Angio Chest Aorta W/cm &/or Wo/cm  07/16/2015  CLINICAL DATA:  Shortness of breath, cough, preop for mitral valve replacement. EXAM: CT ANGIOGRAPHY CHEST, ABDOMEN AND PELVIS TECHNIQUE: Multidetector CT imaging through the chest, abdomen and pelvis was performed using the standard protocol during bolus administration of intravenous contrast. Multiplanar reconstructed images and MIPs were obtained and reviewed to evaluate the vascular anatomy. CONTRAST:  75 mL of Isovue 370 intravenously. COMPARISON:  None. FINDINGS: CTA CHEST FINDINGS No pneumothorax or pleural effusion is noted. No acute pulmonary disease is noted. There is no evidence of thoracic aortic dissection or aneurysm. Great vessels are widely patent. The patient does appear to have a patent ductus arteriosus with jet seen extending into the main pulmonary artery. There is noted enlargement of the left atrium and left atrial appendage. No significant osseous abnormality is noted. No mediastinal mass or adenopathy is noted. Review of the MIP images confirms the above findings. CTA ABDOMEN AND PELVIS FINDINGS No gallstones are noted. The liver, spleen and pancreas are unremarkable. Adrenal glands and kidneys appear normal. No hydronephrosis or renal obstruction is noted. There is no evidence of abdominal aortic aneurysm or dissection. Mesenteric and renal arteries are widely patent. There is no evidence of bowel obstruction. The appendix appears normal. Iliac arteries are  widely patent without significant stenosis. Urinary bladder appears normal. Uterus appears normal. Small amount of free fluid is noted in the pelvis which most likely is physiologic. No significant adenopathy is noted. No significant osseous abnormality is noted in the abdomen or pelvis. Review of the MIP images confirms the above findings. IMPRESSION: No evidence of thoracic or abdominal aortic aneurysm or dissection. Enlargement of the left atrium and atrial appendage is noted consistent with mitral valve regurgitation. Patent ductus arteriosus is noted. These results Kanai Hilger be called to the ordering clinician or representative by the Radiologist Assistant, and communication documented in the PACS or zVision Dashboard. Electronically Signed   By: Lupita Raider, M.D.   On: 07/16/2015 15:33   Ct Angio Abd/pel W/ And/or W/o  07/16/2015  CLINICAL DATA:  Shortness of breath, cough, preop for mitral valve replacement. EXAM: CT ANGIOGRAPHY CHEST, ABDOMEN AND PELVIS TECHNIQUE: Multidetector CT imaging through the chest, abdomen and pelvis was performed using the standard protocol during bolus administration of intravenous contrast. Multiplanar reconstructed images and MIPs were obtained and reviewed to evaluate the vascular anatomy. CONTRAST:  75 mL of Isovue 370 intravenously. COMPARISON:  None. FINDINGS: CTA CHEST FINDINGS No pneumothorax or pleural effusion is noted. No acute pulmonary disease is noted. There is no evidence of thoracic aortic dissection or aneurysm. Great vessels are widely patent. The patient does appear to have a patent ductus arteriosus with jet seen extending into the main pulmonary artery. There is noted enlargement of the left atrium and left atrial appendage. No significant osseous abnormality is noted. No mediastinal mass or adenopathy is noted. Review of the MIP images confirms the above findings. CTA ABDOMEN AND PELVIS FINDINGS No gallstones are noted. The liver, spleen and pancreas are  unremarkable. Adrenal glands and kidneys appear normal. No hydronephrosis or renal obstruction is noted. There is no evidence of abdominal aortic aneurysm or dissection. Mesenteric and renal arteries are widely patent. There is no evidence of bowel obstruction. The appendix appears normal. Iliac arteries are widely patent without significant stenosis. Urinary bladder appears normal. Uterus appears normal. Small amount of free fluid is noted in the pelvis which most likely is physiologic. No significant adenopathy is noted. No significant osseous abnormality is noted in the abdomen or pelvis. Review of the MIP images confirms the above findings. IMPRESSION: No evidence of thoracic or abdominal aortic aneurysm or dissection. Enlargement of the left atrium and atrial appendage is noted consistent with mitral valve regurgitation. Patent ductus arteriosus is noted. These results Estellar Cadena be called to the ordering clinician or representative by the Radiologist Assistant, and communication documented in the PACS or zVision Dashboard. Electronically Signed   By: Lupita Raider, M.D.   On: 07/16/2015 15:33    ASSESSMENT AND PLAN:  Principal Problem:   Cardiac arrest [facility]) Active Problems:   Severe mitral regurgitation   Patent ductus arteriosus   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   Long term (current) use of anticoagulants [Z79.01]   Respiratory arrest (HCC)   Acute respiratory failure with hypoxia (HCC)   Anoxic encephalopathy (HCC)   Cardiogenic shock (HCC)   Pleural effusion Agree with diuresis Junctional rhythm overnight, may require pacemaker placement No need for amiodarone at this time. Anticoagulation per critical care  Theda Payer Jorja Loa, MD 08/11/2015 3:38 PM

## 2015-08-12 ENCOUNTER — Inpatient Hospital Stay (HOSPITAL_COMMUNITY): Payer: BLUE CROSS/BLUE SHIELD

## 2015-08-12 DIAGNOSIS — I5020 Unspecified systolic (congestive) heart failure: Secondary | ICD-10-CM

## 2015-08-12 DIAGNOSIS — I509 Heart failure, unspecified: Secondary | ICD-10-CM | POA: Insufficient documentation

## 2015-08-12 DIAGNOSIS — I340 Nonrheumatic mitral (valve) insufficiency: Secondary | ICD-10-CM

## 2015-08-12 DIAGNOSIS — T80212A Local infection due to central venous catheter, initial encounter: Secondary | ICD-10-CM | POA: Insufficient documentation

## 2015-08-12 DIAGNOSIS — T17908A Unspecified foreign body in respiratory tract, part unspecified causing other injury, initial encounter: Secondary | ICD-10-CM

## 2015-08-12 DIAGNOSIS — T17998A Other foreign object in respiratory tract, part unspecified causing other injury, initial encounter: Secondary | ICD-10-CM

## 2015-08-12 LAB — PHOSPHORUS: PHOSPHORUS: 4.2 mg/dL (ref 2.5–4.6)

## 2015-08-12 LAB — COMPREHENSIVE METABOLIC PANEL
ALK PHOS: 106 U/L (ref 38–126)
ALT: 243 U/L — AB (ref 14–54)
AST: 99 U/L — AB (ref 15–41)
Albumin: 2.7 g/dL — ABNORMAL LOW (ref 3.5–5.0)
Anion gap: 16 — ABNORMAL HIGH (ref 5–15)
BILIRUBIN TOTAL: 1.7 mg/dL — AB (ref 0.3–1.2)
BUN: 17 mg/dL (ref 6–20)
CALCIUM: 8.7 mg/dL — AB (ref 8.9–10.3)
CO2: 26 mmol/L (ref 22–32)
CREATININE: 0.81 mg/dL (ref 0.44–1.00)
Chloride: 92 mmol/L — ABNORMAL LOW (ref 101–111)
Glucose, Bld: 102 mg/dL — ABNORMAL HIGH (ref 65–99)
Potassium: 3.4 mmol/L — ABNORMAL LOW (ref 3.5–5.1)
Sodium: 134 mmol/L — ABNORMAL LOW (ref 135–145)
TOTAL PROTEIN: 6.4 g/dL — AB (ref 6.5–8.1)

## 2015-08-12 LAB — GLUCOSE, CAPILLARY
GLUCOSE-CAPILLARY: 103 mg/dL — AB (ref 65–99)
GLUCOSE-CAPILLARY: 103 mg/dL — AB (ref 65–99)
GLUCOSE-CAPILLARY: 113 mg/dL — AB (ref 65–99)
Glucose-Capillary: 97 mg/dL (ref 65–99)
Glucose-Capillary: 94 mg/dL (ref 65–99)
Glucose-Capillary: 91 mg/dL (ref 65–99)

## 2015-08-12 LAB — CBC
HEMATOCRIT: 30.8 % — AB (ref 36.0–46.0)
HEMOGLOBIN: 10.1 g/dL — AB (ref 12.0–15.0)
MCH: 30.2 pg (ref 26.0–34.0)
MCHC: 32.8 g/dL (ref 30.0–36.0)
MCV: 92.2 fL (ref 78.0–100.0)
Platelets: 290 10*3/uL (ref 150–400)
RBC: 3.34 MIL/uL — ABNORMAL LOW (ref 3.87–5.11)
RDW: 14.5 % (ref 11.5–15.5)
WBC: 6.2 10*3/uL (ref 4.0–10.5)

## 2015-08-12 LAB — MAGNESIUM: Magnesium: 2.3 mg/dL (ref 1.7–2.4)

## 2015-08-12 MED ORDER — WHITE PETROLATUM GEL
Status: DC | PRN
  Filled 2015-08-12: qty 1

## 2015-08-12 MED ORDER — CETYLPYRIDINIUM CHLORIDE 0.05 % MT LIQD
7.0000 mL | Freq: Two times a day (BID) | OROMUCOSAL | Status: DC
  Administered 2015-08-13 – 2015-08-29 (×24): 7 mL via OROMUCOSAL

## 2015-08-12 MED ORDER — FUROSEMIDE 10 MG/ML IJ SOLN
60.0000 mg | Freq: Two times a day (BID) | INTRAMUSCULAR | Status: DC
  Administered 2015-08-12: 60 mg via INTRAVENOUS
  Filled 2015-08-12 (×2): qty 6

## 2015-08-12 MED ORDER — POTASSIUM CHLORIDE 10 MEQ/50ML IV SOLN
10.0000 meq | INTRAVENOUS | Status: AC
  Administered 2015-08-12 (×4): 10 meq via INTRAVENOUS
  Filled 2015-08-12 (×3): qty 50

## 2015-08-12 MED ORDER — POTASSIUM CHLORIDE 10 MEQ/100ML IV SOLN: 10.0000 meq | Freq: Once | INTRAVENOUS | Status: DC

## 2015-08-12 NOTE — Progress Notes (Signed)
PULMONARY / CRITICAL CARE MEDICINE   Name: Diane [name] MRN: [id number] DOB: Jan 18, 1961    ADMISSION DATE:  08/05/2015 CONSULTATION DATE:  08/05/15  REFERRING MD :  EDP PRIMARY SERVICE: PCCM   BRIEF PATIENT DESCRIPTION: 54-yo F with MVR repair on 07/22/15 found to be in PEA/VT arrest with 3 shocks and ROSC after 7-9 minutes requiring hypothermic protocol.   SIGNIFICANT EVENTS / STUDIES:  1/17 Admitted for PEA/VT arrest, intubated, placed on hypothermic protocol   1/17 CTA chest with no PO, moderate bilateral pleural effusions and consolidative changes of lower lobes 1/17 Echo with normal EF, mild LVH, mild LAW, trace AI, mild MS, no MR, small pericardial effusion, left pleural effusion 1/18 EEG with generalized slowing  1/19 Rewarmed, paralytics, sedation turned off 1/19 CT head normal 1/20 CXR with left sided pleural effusion and b/l pulmonary edema  1/20 INR 9.4 requiring FFP and vitamin K 1/20 IV amio stopped  1/20 EEG with generalized slowing  1/21 Improved mental status, start IV heparin  1/22 Vomited tube feeds, KUB normal  1/23 Thoracentesis of large left effusion with 950 cc semi-hemorrhagic fluid. IV heparin stopped. 1/23 2D echo   LINES / TUBES: 1/17 ETT >> 1/17 Central line >> 1/18 OG >> 1/18 Foley >> 1/18 Aline >>>  CULTURES: 1/18 Blood >> 1/18 Tracheal >> normal flora 1/18 Urine >> 1/18 MRSA >> neg  1/23 Left pleural fluid >> 1/23 Left pleural fungal >>  ANTIBIOTICS: 1/18 Unasyn >>   SUBJECTIVE:  Pt had thoracentesis yesterday with pain last night requiring fentanyl. Levo off with stable MAP 63-64.  Currently on 30% FIO2,  5 of PEEP, and RR 10. Net negative 4.7 L since admission on IV lasix.  VITAL SIGNS: Temp:  [97.3 F (36.3 C)-98 F (36.7 C)] 97.7 F (36.5 C) (01/24 0700) Pulse Rate:  [88-98] 94 (01/24 0749) Resp:  [13-21] 16 (01/24 0749) BP: (84-140)/(47-64) 87/50 mmHg (01/24 0749) SpO2:  [100 %] 100 % (01/24 0749) Arterial Line BP:  (91-147)/(49-70) 92/51 mmHg (01/24 0700) FiO2 (%):  [30 %] 30 % (01/24 0749) Weight:  [115 lb 8.3 oz (52.4 kg)] 115 lb 8.3 oz (52.4 kg) (01/24 0500) HEMODYNAMICS: CVP:  [5 mmHg-7 mmHg] 7 mmHg VENTILATOR SETTINGS: Vent Mode:  [-] PRVC FiO2 (%):  [30 %] 30 % Set Rate:  [16 bmp] 16 bmp Vt Set:  [400 mL] 400 mL PEEP:  [5 cmH20] 5 cmH20 Pressure Support:  [8 cmH20] 8 cmH20 Plateau Pressure:  [12 cmH20-20 cmH20] 17 cmH20 INTAKE / OUTPUT: Intake/Output      01/23 0701 - 01/24 0700 01/24 0701 - 01/25 0700   I.V. (mL/kg) 283.2 (5.4)    NG/GT     IV Piggyback 350    Total Intake(mL/kg) 633.2 (12.1)    Urine (mL/kg/hr) 2755 (2.2)    Total Output 2755     Net -2121.8            PHYSICAL EXAMINATION: General:  NAD, intubated on vent Neuro: Asleep  HEENT:  OG, normocephalic  Cardiovascular:  Normal rate and rhythm  Lungs:  Ronchi/coarse BS in anterior fields  Abdomen: Arctic pads on, non-tender, nondistended, decreased BS Musculoskeletal:  SCD's on, trace pedal edema Skin:  No rashes  LABS:  CBC  Recent Labs Lab 08/10/15 0420 08/11/15 0400 08/12/15 0510  WBC 6.9 7.2 6.2  HGB 9.5* 10.2* 10.1*  HCT 27.8* 29.5* 30.8*  PLT 256 277 290   Coag's  Recent Labs Lab 08/05/15 2351 08/06/15 0634  08/08/15 0410  08/08/15 1649 08/09/15 0430  APTT 56* 38*  --   --   --   --   INR 2.71* 2.54*  < > 9.40* 1.53* 1.41  < > = values in this interval not displayed. BMET  Recent Labs Lab 08/10/15 0420 08/11/15 0400 08/12/15 0510  NA 132* 133* 134*  K 3.5 3.0* 3.4*  CL 100* 92* 92*  CO2 23 25 26   BUN 12 10 17   CREATININE 0.60 0.73 0.81  GLUCOSE 121* 105* 102*   Electrolytes  Recent Labs Lab 08/10/15 0420 08/11/15 0400 08/12/15 0510  CALCIUM 8.1* 8.1* 8.7*  MG 2.0 1.9 2.3  PHOS 3.2 3.1 4.2   Sepsis Markers  Recent Labs Lab 08/05/15 2007 08/05/15 2135 08/11/15 0400  LATICACIDVEN 5.7* 5.4*  --   PROCALCITON  --   --  0.11   ABG  Recent Labs Lab  08/07/15 1132 08/08/15 0409 08/09/15 0310  PHART 7.444 7.379 7.499*  PCO2ART 26.6* 29.7* 29.2*  PO2ART 122.0* 133* 172*   Liver Enzymes  Recent Labs Lab 08/10/15 0420 08/11/15 0400 08/12/15 0510  AST 202* 149* 99*  ALT 364* 327* 243*  ALKPHOS 101 106 106  BILITOT 1.7* 1.8* 1.7*  ALBUMIN 2.2* 2.7* 2.7*   Cardiac Enzymes  Recent Labs Lab 08/06/15 0200 08/06/15 0400 08/06/15 0845  TROPONINI 0.52* 0.58* 0.65*   Glucose  Recent Labs Lab 08/11/15 0751 08/11/15 1152 08/11/15 1538 08/11/15 2010 08/12/15 0009 08/12/15 0511  GLUCAP 98 94 102* 107* 103* 97    Imaging Dg Chest Port 1 View  08/12/2015  CLINICAL DATA:  Endotracheal tube.  Shortness of breath. EXAM: PORTABLE CHEST 1 VIEW COMPARISON:  08/11/2015 FINDINGS: Endotracheal tube with tip measuring 4.5 cm above the carina. Enteric tube tip is off the field of view but below the left hemidiaphragm. Left central venous catheter with tip over the low SVC region. Normal heart size and pulmonary vascularity. Shallow inspiration. Small bilateral pleural effusions with basilar atelectasis or infiltration. No definite change since previous study, allowing for differences in technique. Postoperative changes in the mediastinum. IMPRESSION: Appliances appear to be in satisfactory location. Small bilateral pleural effusions with basilar atelectasis or infiltration. Electronically Signed   By: Burman Nieves M.D.   On: 08/12/2015 03:39   Dg Chest Port 1 View  08/11/2015  CLINICAL DATA:  Status post left-sided thoracentesis EXAM: PORTABLE CHEST 1 VIEW COMPARISON:  Study obtained earlier in the day FINDINGS: Left effusion is smaller following thoracentesis. No pneumothorax. Endotracheal tube tip is 3.6 cm above the carina. Central catheter tip is in the superior vena cava. Nasogastric tube tip and side port are below the diaphragm in the stomach. There remains a fairly small left effusion. There is also a small right effusion. There is  moderate atelectatic change in the left base and medial right base regions. Heart is mildly enlarged. Patient has a left atrial appendage clamp. The patient is status post mitral valve replacement. IMPRESSION: Left effusion smaller following thoracentesis. No pneumothorax. There is a small left effusion with small right effusion is well. Bibasilar atelectatic change, more on the left than on the right. Stable cardiac silhouette. Tube and catheter positions as described without pneumothorax. Electronically Signed   By: Bretta Bang III M.D.   On: 08/11/2015 13:02   Dg Chest Port 1 View  08/11/2015  CLINICAL DATA:  Ventilator.  Mitral valve repair. EXAM: PORTABLE CHEST 1 VIEW COMPARISON:  08/10/2015 FINDINGS: Changes of median sternotomy and valve replacement. Bilateral pleural effusions, left  greater than right, likely slightly improved since prior study. Bilateral airspace opacities, most pronounced in the the lower lobes, also greater on the left with slight improvement in aeration since prior study. No pneumothorax. No acute bony abnormality. IMPRESSION: Moderate to large bilateral pleural effusions and bilateral airspace disease, both greater on the left. This is improved slightly since prior study. Support devices stable. Electronically Signed   By: Charlett Nose M.D.   On: 08/11/2015 07:12   Dg Chest Port 1 View  08/10/2015  CLINICAL DATA:  Endotracheally intubated. Pt currently on lasix and diuresed about 4 liters of fluid from chest. Checking on fluid to be able to extubate tomorrow. EXAM: PORTABLE CHEST 1 VIEW COMPARISON:  08/10/2015 FINDINGS: Lines and tubes unchanged. Large left pleural effusion unchanged. Moderate right pleural effusion unchanged. Vascular congestion stable. Bilateral airspace opacity presumably represent the compressive atelectasis as the result of the pleural fluid stable. IMPRESSION: Radiographically no change from prior study Electronically Signed   By: Esperanza Heir M.D.    On: 08/10/2015 14:12   Dg Abd Portable 1v  08/10/2015  CLINICAL DATA:  Vomiting tube feedings, orogastric tube placement EXAM: PORTABLE ABDOMEN - 1 VIEW COMPARISON:  None. FINDINGS: Orogastric tube projects just to the right of midline over the anticipated position of the distal body of the stomach. Nonobstructive gas pattern. Rectal thermometer noted. IMPRESSION: OG tube as described Electronically Signed   By: Esperanza Heir M.D.   On: 08/10/2015 17:43     CXR: pending   ASSESSMENT / PLAN:  PULMONARY A: Ventilator dependent respiratory failure in setting of cardiopulmonary arrest Aspiration pneumonia  Bilateral pulmonary edema - resolved  Moderate to large exudative pleural effusions s/p left sided thoracentesis on 1/23   P:  Continue full vent settings  Wean PEEP/FIO2 to keep SpO2 >92%  Hopeful to extubate today Continue IV unasyn Day 7/7 IV lasix 80 mg BID, consider decreasing  Follow-up pleural fluid studies CXR in AM  CARDIOVASCULAR A:  Hypotension on pressor support - off levo  PEA and VT arrest with ROSC 7-9 minutes s/p hypothermic protocol in setting of junctional rhythm  Severe MR s/p repair (07/22/15) on coumadin  Atrial fibrillation s/p LAA clipping (07/22/15) 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% on 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) P:  EP and cardiology following  Goal MAP >60 IV lasix 80 mg BID, consider decreasing  Holding amiodarone  Hold AC therapy  Plan for cardiac MRI tomm  Needs dual chamber ICD before discharge Repeat 2D-echo pending  RENAL A: Mild hyponatremia - 134 Mild hypokalemia - 3.4 P:   Monitor BMP and UOP Replete K, aggressive  GASTROINTESTINAL A: Elevated AST/ALT in setting of ischemia - improving  Hyperbilirubinemia  Nutrition  GI PPx P:   Monitor CMP Tube feeds hold wenaing IV protonix 40 mg daily  HEMATOLOGIC A:  Hemorrhagic pleural effusion Chronic AC therapy in setting of MVR and afib  Supratheraputeuic INR with no bleeding - resolved  Acute  normocytic anemia - no active bleeding P:  Hold AC therapy with bloody tap for now Monitor CBC   INFECTIOUS A:  Aspiration pneumonia - resolved on CXR Leukocytosis - resolved  P:   IV unasyn Day 7/7  Follow-up blood cultures   ENDOCRINE A:  Hyperglycemia   Prediabetes - A1c 5.8 on 07/17/15  P:   SSI insulin with goal CBG <180  NEUROLOGIC A:  Anoxic brain injury s/p cardiac arrest and warming with CPR 7-9 min  Pain  P:   Fentanyl PRN  Otis Brace  PGY-3 IMTS Pager [phone]   08/12/2015, 8:22 AM   STAFF NOTE: Cindi Carbon, MD FACP have personally reviewed patient's available data, including medical history, events of note, physical examination and test results as part of my evaluation. I have discussed with resident/NP and other care providers such as pharmacist, RN and RRT. In addition, I personally evaluated patient and elicited key findings of: awake, alert, lungs sound improved, s/p thora dresslers?, left , steroids added, continued neg balanec, pcxr improved overall, doubt rt will inhibit extubation and she is responding to lasix well, if no signifiant re accumulation effusion in 48 hr I will restart heparin, for MRI cardiac if stable after extubation, k supp, allow abx to dc, reduce lasix slight, need to mobilize, dc a line  The patient is critically ill with multiple organ systems failure and requires high complexity decision making for assessment and support, frequent evaluation and titration of therapies, application of advanced monitoring technologies and extensive interpretation of multiple databases.   Critical Care Time devoted to patient care services described in this note is 30 Minutes. This time reflects time of care of this signee: Rory Percy, MD FACP. This critical care time does not reflect procedure time, or teaching time or supervisory time of PA/NP/Med student/Med Resident etc but could involve care discussion time. Rest per NP/medical  resident whose note is outlined above and that I agree with   Mcarthur Rossetti. Tyson Alias, MD, FACP Pgr: [phone] [facility] 08/12/2015 8:59 AM

## 2015-08-12 NOTE — Progress Notes (Signed)
*  PRELIMINARY RESULTS* Echocardiogram 2D Echocardiogram has been performed.  Diane [name] 08/12/2015, 9:11 AM

## 2015-08-12 NOTE — Progress Notes (Signed)
      [address]       [place] [address]             [phone]        CARDIOTHORACIC SURGERY PROGRESS NOTE  Subjective: Looks great.  Extubated and breathing comfortably.  Denies pain.  Very weak and voice is hoarse.  Objective: Vital signs: BP Readings from Last 1 Encounters:  08/12/15 103/49   Pulse Readings from Last 1 Encounters:  08/12/15 98   Resp Readings from Last 1 Encounters:  08/12/15 19   Temp Readings from Last 1 Encounters:  08/12/15 98.2 F (36.8 C) Core (Comment)    Hemodynamics: CVP:  [2 mmHg-7 mmHg] 2 mmHg  Physical Exam:  Rhythm:   sinus  Breath sounds: clear  Heart sounds:  RRR w/out murmur  Incisions:  Clean and dry  Abdomen:  Soft, non-distended, non-tender  Extremities:  Warm, well-perfused    Intake/Output from previous day: 01/23 0701 - 01/24 0700 In: 643.2 [I.V.:293.2; IV Piggyback:350] Out: 2755 [Urine:2755] Intake/Output this shift: Total I/O In: 200.2 [I.V.:50.2; IV Piggyback:150] Out: 550 [Urine:550]  Lab Results:  CBC: Recent Labs  08/11/15 0400 08/12/15 0510  WBC 7.2 6.2  HGB 10.2* 10.1*  HCT 29.5* 30.8*  PLT 277 290    BMET:  Recent Labs  08/11/15 0400 08/12/15 0510  NA 133* 134*  K 3.0* 3.4*  CL 92* 92*  CO2 25 26  GLUCOSE 105* 102*  BUN 10 17  CREATININE 0.73 0.81  CALCIUM 8.1* 8.7*     PT/INR:  No results for input(s): LABPROT, INR in the last 72 hours.  CBG (last 3)   Recent Labs  08/12/15 0511 08/12/15 0807 08/12/15 1308  GLUCAP 97 103* 94    ABG    Component Value Date/Time   PHART 7.499* 08/09/2015 0310   PCO2ART 29.2* 08/09/2015 0310   PO2ART 172* 08/09/2015 0310   HCO3 22.6 08/09/2015 0310   TCO2 23.5 08/09/2015 0310   ACIDBASEDEF 0.3 08/09/2015 0310   O2SAT 99.6 08/09/2015 0310    CXR: PORTABLE CHEST 1 VIEW  COMPARISON: 08/11/2015  FINDINGS: Endotracheal tube with tip measuring 4.5 cm above the carina. Enteric tube tip is off the field of view  but below the left hemidiaphragm. Left central venous catheter with tip over the low SVC region. Normal heart size and pulmonary vascularity. Shallow inspiration. Small bilateral pleural effusions with basilar atelectasis or infiltration. No definite change since previous study, allowing for differences in technique. Postoperative changes in the mediastinum.  IMPRESSION: Appliances appear to be in satisfactory location. Small bilateral pleural effusions with basilar atelectasis or infiltration.   Electronically Signed  By: Burman Nieves M.D.  On: 08/12/2015 03:39  Assessment/Plan:  Making excellent progress Plan per Pulm/CCM and EPS teams. Will continue to follow.  Purcell Nails, MD 08/12/2015 1:31 PM

## 2015-08-12 NOTE — Procedures (Signed)
Extubation Procedure Note  Patient Details:   Name: Nazifa Gonser DOB: 04/25/1961 MRN: [id number]   Airway Documentation:   Positive cuff leak test. Extubated to 2L , RN at bedside.   Evaluation  O2 sats: stable throughout Complications: No apparent complications Patient did tolerate procedure well. Bilateral Breath Sounds: Clear Suctioning: Airway Yes  [place] R Haylea Schlichting 08/12/2015, 9:26 AM

## 2015-08-12 NOTE — Progress Notes (Signed)
SUBJECTIVE: The patient remains intubated. S/p thorancentesis yesterday, plan for extubation today per nursing.  No further arrhythmias  CURRENT MEDICATIONS: . ampicillin-sulbactam (UNASYN) IV  1.5 g Intravenous Q6H  . antiseptic oral rinse  7 mL Mouth Rinse QID  . chlorhexidine gluconate  15 mL Mouth Rinse BID  . furosemide  80 mg Intravenous BID  . insulin aspart  0-9 Units Subcutaneous 6 times per day  . methylPREDNISolone (SOLU-MEDROL) injection  40 mg Intravenous Q12H  . pantoprazole (PROTONIX) IV  40 mg Intravenous QHS  . sodium chloride  10-40 mL Intracatheter Q12H   . norepinephrine (LEVOPHED) Adult infusion Stopped (08/11/15 1330)    OBJECTIVE: Physical Exam: Filed Vitals:   08/12/15 0500 08/12/15 0600 08/12/15 0700 08/12/15 0749  BP:    87/50  Pulse: 95 93 93 94  Temp: 97.3 F (36.3 C) 97.7 F (36.5 C) 97.7 F (36.5 C)   TempSrc:      Resp: 16 16 16 16   Height:      Weight: 115 lb 8.3 oz (52.4 kg)     SpO2: 100% 100% 100% 100%    Intake/Output Summary (Last 24 hours) at 08/12/15 4327 Last data filed at 08/12/15 0600  Gross per 24 hour  Intake 609.83 ml  Output   2755 ml  Net -2145.17 ml    Telemetry reveals sinus rhythm  GEN- The patient is intubated  Head- normocephalic, atraumatic Eyes-  Sclera clear, conjunctiva pink Ears- hearing intact Oropharynx- +ETT Neck- supple  Lungs- Clear to ausculation bilaterally, normal work of breathing Heart- Regular rate and rhythm  GI- soft, NT, ND, + BS Extremities- no clubbing, cyanosis, or edema Skin- no rash or lesion  LABS: Basic Metabolic Panel:  Recent Labs  61/47/09 0400 08/12/15 0510  NA 133* 134*  K 3.0* 3.4*  CL 92* 92*  CO2 25 26  GLUCOSE 105* 102*  BUN 10 17  CREATININE 0.73 0.81  CALCIUM 8.1* 8.7*  MG 1.9 2.3  PHOS 3.1 4.2   Liver Function Tests:  Recent Labs  08/11/15 0400 08/11/15 1543 08/12/15 0510  AST 149*  --  99*  ALT 327*  --  243*  ALKPHOS 106  --  106  BILITOT  1.8*  --  1.7*  PROT 6.5 6.1* 6.4*  ALBUMIN 2.7*  --  2.7*   CBC:  Recent Labs  08/11/15 0400 08/12/15 0510  WBC 7.2 6.2  HGB 10.2* 10.1*  HCT 29.5* 30.8*  MCV 91.9 92.2  PLT 277 290   Fasting Lipid Panel:  Recent Labs  08/11/15 1544  CHOL 161    RADIOLOGY: Dg Chest Port 1 View 08/12/2015  CLINICAL DATA:  Endotracheal tube.  Shortness of breath. EXAM: PORTABLE CHEST 1 VIEW COMPARISON:  08/11/2015 FINDINGS: Endotracheal tube with tip measuring 4.5 cm above the carina. Enteric tube tip is off the field of view but below the left hemidiaphragm. Left central venous catheter with tip over the low SVC region. Normal heart size and pulmonary vascularity. Shallow inspiration. Small bilateral pleural effusions with basilar atelectasis or infiltration. No definite change since previous study, allowing for differences in technique. Postoperative changes in the mediastinum. IMPRESSION: Appliances appear to be in satisfactory location. Small bilateral pleural effusions with basilar atelectasis or infiltration. Electronically Signed   By: Burman Nieves M.D.   On: 08/12/2015 03:39    ASSESSMENT AND PLAN:  Principal Problem:   Cardiac arrest [facility]) Active Problems:   Severe mitral regurgitation   Patent ductus arteriosus  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   Long term (current) use of anticoagulants [Z79.01]   Respiratory arrest (HCC)   Acute respiratory failure with hypoxia (HCC)   Anoxic encephalopathy (HCC)   Cardiogenic shock (HCC)   Pleural effusion  1.  Cardiac arrest EMS records describe both PEA as well as VT during arrest Will plan for cardiac MRI tomorrow if extubated Will need device prior to discharge, likely dual chamber ICD (would prefer to have all lines removed x24 hours prior to device implant) Repeat echo to look at LVEF post arrest  2.  VDRF Per CCM  3.  Junctional tachycardia Resolved  Dr Ladona Ridgel to see later today  Gypsy Balsam, NP 08/12/2015 8:27 AM  Pt not seen by MD 08/12/15

## 2015-08-13 ENCOUNTER — Inpatient Hospital Stay (HOSPITAL_COMMUNITY): Payer: BLUE CROSS/BLUE SHIELD

## 2015-08-13 DIAGNOSIS — I428 Other cardiomyopathies: Secondary | ICD-10-CM

## 2015-08-13 DIAGNOSIS — I4581 Long QT syndrome: Secondary | ICD-10-CM

## 2015-08-13 DIAGNOSIS — I469 Cardiac arrest, cause unspecified: Secondary | ICD-10-CM

## 2015-08-13 DIAGNOSIS — E876 Hypokalemia: Secondary | ICD-10-CM

## 2015-08-13 DIAGNOSIS — R9431 Abnormal electrocardiogram [ECG] [EKG]: Secondary | ICD-10-CM

## 2015-08-13 LAB — CBC
HCT: 31.6 % — ABNORMAL LOW (ref 36.0–46.0)
HEMOGLOBIN: 10.5 g/dL — AB (ref 12.0–15.0)
MCH: 31 pg (ref 26.0–34.0)
MCHC: 33.2 g/dL (ref 30.0–36.0)
MCV: 93.2 fL (ref 78.0–100.0)
Platelets: 303 10*3/uL (ref 150–400)
RBC: 3.39 MIL/uL — AB (ref 3.87–5.11)
RDW: 14.8 % (ref 11.5–15.5)
WBC: 7.2 10*3/uL (ref 4.0–10.5)

## 2015-08-13 LAB — COMPREHENSIVE METABOLIC PANEL
ALK PHOS: 97 U/L (ref 38–126)
ALT: 181 U/L — AB (ref 14–54)
AST: 68 U/L — ABNORMAL HIGH (ref 15–41)
Albumin: 2.9 g/dL — ABNORMAL LOW (ref 3.5–5.0)
Anion gap: 16 — ABNORMAL HIGH (ref 5–15)
BUN: 29 mg/dL — ABNORMAL HIGH (ref 6–20)
CALCIUM: 8.8 mg/dL — AB (ref 8.9–10.3)
CO2: 32 mmol/L (ref 22–32)
CREATININE: 0.73 mg/dL (ref 0.44–1.00)
Chloride: 86 mmol/L — ABNORMAL LOW (ref 101–111)
Glucose, Bld: 104 mg/dL — ABNORMAL HIGH (ref 65–99)
Potassium: 3 mmol/L — ABNORMAL LOW (ref 3.5–5.1)
Sodium: 134 mmol/L — ABNORMAL LOW (ref 135–145)
TOTAL PROTEIN: 6.9 g/dL (ref 6.5–8.1)
Total Bilirubin: 1.4 mg/dL — ABNORMAL HIGH (ref 0.3–1.2)

## 2015-08-13 LAB — GLUCOSE, CAPILLARY
GLUCOSE-CAPILLARY: 101 mg/dL — AB (ref 65–99)
Glucose-Capillary: 98 mg/dL (ref 65–99)
Glucose-Capillary: 108 mg/dL — ABNORMAL HIGH (ref 65–99)

## 2015-08-13 LAB — PHOSPHORUS: PHOSPHORUS: 4.2 mg/dL (ref 2.5–4.6)

## 2015-08-13 LAB — BASIC METABOLIC PANEL
ANION GAP: 11 (ref 5–15)
BUN: 27 mg/dL — AB (ref 6–20)
CHLORIDE: 88 mmol/L — AB (ref 101–111)
CO2: 34 mmol/L — ABNORMAL HIGH (ref 22–32)
Calcium: 8.8 mg/dL — ABNORMAL LOW (ref 8.9–10.3)
Creatinine, Ser: 0.63 mg/dL (ref 0.44–1.00)
Glucose, Bld: 132 mg/dL — ABNORMAL HIGH (ref 65–99)
POTASSIUM: 3.6 mmol/L (ref 3.5–5.1)
SODIUM: 133 mmol/L — AB (ref 135–145)

## 2015-08-13 LAB — HEPARIN LEVEL (UNFRACTIONATED): HEPARIN UNFRACTIONATED: 0.3 [IU]/mL (ref 0.30–0.70)

## 2015-08-13 LAB — MAGNESIUM: MAGNESIUM: 2.6 mg/dL — AB (ref 1.7–2.4)

## 2015-08-13 MED ORDER — POTASSIUM CHLORIDE 10 MEQ/50ML IV SOLN
10.0000 meq | INTRAVENOUS | Status: AC
  Administered 2015-08-13 (×4): 10 meq via INTRAVENOUS
  Filled 2015-08-13 (×4): qty 50

## 2015-08-13 MED ORDER — FUROSEMIDE 10 MG/ML IJ SOLN
40.0000 mg | Freq: Every day | INTRAMUSCULAR | Status: DC
  Administered 2015-08-13 – 2015-08-15 (×3): 40 mg via INTRAVENOUS
  Filled 2015-08-13 (×3): qty 4

## 2015-08-13 MED ORDER — POLYVINYL ALCOHOL 1.4 % OP SOLN
1.0000 [drp] | OPHTHALMIC | Status: DC | PRN
  Filled 2015-08-13: qty 15

## 2015-08-13 MED ORDER — HYPROMELLOSE (GONIOSCOPIC) 2.5 % OP SOLN
1.0000 [drp] | OPHTHALMIC | Status: DC | PRN
  Filled 2015-08-13: qty 15

## 2015-08-13 MED ORDER — METHYLPREDNISOLONE SODIUM SUCC 40 MG IJ SOLR
40.0000 mg | INTRAMUSCULAR | Status: DC
  Administered 2015-08-14: 40 mg via INTRAVENOUS
  Filled 2015-08-13: qty 1

## 2015-08-13 MED ORDER — HEPARIN (PORCINE) IN NACL 100-0.45 UNIT/ML-% IJ SOLN
750.0000 [IU]/h | INTRAMUSCULAR | Status: DC
  Administered 2015-08-13: 750 [IU]/h via INTRAVENOUS
  Filled 2015-08-13: qty 250

## 2015-08-13 MED ORDER — BOOST / RESOURCE BREEZE PO LIQD
1.0000 | Freq: Two times a day (BID) | ORAL | Status: DC
  Administered 2015-08-13 – 2015-08-24 (×16): 1 via ORAL

## 2015-08-13 NOTE — Progress Notes (Signed)
ANTICOAGULATION CONSULT NOTE  Pharmacy Consult for heparin Indication: hx MVR  Allergies  Allergen Reactions  . Vicodin [Hydrocodone-Acetaminophen] Other (See Comments)    "she feels out of it", hallucinating  . Adhesive [Tape] Itching and Rash  . Silicone Itching and Rash    TAPE ALLERGY/EKG STICKER ALLERGY, Please use "paper" tape only    Patient Measurements: Height: 5\' 2"  (157.5 cm) Weight: 115 lb 8.3 oz (52.4 kg) IBW/kg (Calculated) : 50.1 Heparin Dosing Weight: 52.2kg  Vital Signs: Temp: 97.4 F (36.3 C) (01/25 1944) Temp Source: Oral (01/25 1944) BP: 86/46 mmHg (01/25 1944) Pulse Rate: 96 (01/25 1900)  Labs:  Recent Labs  08/11/15 0400 08/12/15 0510 08/13/15 0530 08/13/15 2000 08/13/15 2138  HGB 10.2* 10.1* 10.5*  --   --   HCT 29.5* 30.8* 31.6*  --   --   PLT 277 290 303  --   --   HEPARINUNFRC  --   --   --  0.30  --   CREATININE 0.73 0.81 0.73  --  0.63    Estimated Creatinine Clearance: 62.1 mL/min (by C-G formula based on Cr of 0.63).   Medical History: Past Medical History  Diagnosis Date  . Shortness of breath 05/29/2015  . Severe mitral regurgitation 07/03/2015  . Chronic combined systolic and diastolic CHF (congestive heart failure) (HCC) 07/03/2015    Grade 3 diastolic dysfunction.  LVEF 40-45% 05/2015.  Marland Kitchen Aortic aneurysm (HCC)   . Patent ductus arteriosus 07/16/2015  . Depression     SOME DEPRESSION (ON MEDS FOR 2 YRS) WHEN HER MOTHER PASSED  . 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 antiseptic oral rinse  7 mL Mouth Rinse BID  . feeding supplement  1 Container Oral BID BM  . furosemide  40 mg Intravenous Daily  . [START ON 08/14/2015] methylPREDNISolone (SOLU-MEDROL) injection  40 mg Intravenous Q24H    Assessment: 55 yo woman admitted 08/05/2015 for PEA  arrest and possible aspiration pna. Pt was on warfarin PTA for MVR. Originally on heparin gtt which was held for thoracentesis procedure. Pharmacy consulted to restart heparin  PMH severe MR s/p MVR 07/2015, NICM (EF 50-55%), depression, AFib  Level therapeutic this PM  Goal of Therapy:  Heparin level 0.3-0.7 units/ml Monitor platelets by anticoagulation protocol: Yes   Plan:  Continue heparin at 750 units/h Daily HL/CBC Monitor s/sx bleeding  Thank you Okey Regal, PharmD [phone]  08/13/2015,10:46 PM

## 2015-08-13 NOTE — Evaluation (Signed)
Clinical/Bedside Swallow Evaluation Patient Details  Name: Diane [name] MRN: [id number] Date of Birth: [birth year]  Today's Date: 08/13/2015 Time: SLP Start Time (ACUTE ONLY): 4098 SLP Stop Time (ACUTE ONLY): 0839 SLP Time Calculation (min) (ACUTE ONLY): 16 min  Past Medical History:  Past Medical History  Diagnosis Date  . Shortness of breath 05/29/2015  . Severe mitral regurgitation 07/03/2015  . Chronic combined systolic and diastolic CHF (congestive heart failure) (HCC) 07/03/2015    Grade 3 diastolic dysfunction.  LVEF 40-45% 05/2015.  Marland Kitchen Aortic aneurysm (HCC)   . Patent ductus arteriosus 07/16/2015  . Depression     SOME DEPRESSION (ON MEDS FOR 2 YRS) WHEN HER MOTHER PASSED  . 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 Ovarian cyst removal    . Tee without cardioversion N/A 07/07/2015    Procedure: TRANSESOPHAGEAL ECHOCARDIOGRAM (TEE);  Surgeon: Chilton Si, MD;  Location: [facility] ENDOSCOPY;  Service: Cardiovascular;  Laterality: N/A;  . Cardiac catheterization N/A 07/08/2015    Procedure: Right/Left Heart Cath and Coronary Angiography;  Surgeon: Marykay Lex, MD;  Location: [facility] INVASIVE CV LAB;  Service: Cardiovascular;  Laterality: N/A;  . Mitral valve repair N/A 07/22/2015    Procedure: MITRAL VALVE REPAIR;  Surgeon: Purcell Nails, MD;  Location: [facility] OR;  Service: Open Heart Surgery;  Laterality: N/A;  26 Sorin Memo 3D  . Tee without cardioversion N/A 07/22/2015    Procedure: TRANSESOPHAGEAL ECHOCARDIOGRAM (TEE);  Surgeon: Purcell Nails, MD;  Location: [facility] OR;  Service: Open Heart Surgery;  Laterality: N/A;  . Patent ductus arterious repair N/A 07/22/2015    Procedure: PATENT DUCTUS ARTERIOSUS (PDA) ligation;  Surgeon: Purcell Nails, MD;  Location: [facility] OR;   Service: Open Heart Surgery;  Laterality: N/A;  . Clipping of atrial appendage  07/22/2015    Procedure: CLIPPING OF LEFT ATRIAL APPENDAGE;  Surgeon: Purcell Nails, MD;  Location: [facility] OR;  Service: Open Heart Surgery;;  35 Atriclip Pro   HPI:  Ms. Diane [name] is a 55 year old female with PMH of severe mitral regurgitaion s/p mitral valve repair on 07/22/2015, NICM, chronic combined systolic/diastolic CHF, who presented to [facility] ED after cardiac arrest. Intubated on scene 1/17-1/24. Per MD note, in route to hospital patient vomited with likely aspiration. CXR Low lung volumes with persistent bibasilar atelectasis and/or pulmonary infiltrates/edema.    Assessment / Plan / Recommendation Clinical Impression  SLP provided POs to pt at bedside to assess swallow function. Suspected laryngeal penetration with straw sip thin due to increased volume and velocity, however cup sips thin appeared consistently safe. Pt c/o taste sensitivity to sweet applesauce and graham cracker, however this did not affect swallow function. Hyolaryngeal elevation and timeliness of swallow appeared within functional limits. No deficits with oral phase across consistencies. Pt was educated re: diet recommendation and compensatory strategies (liquid wash, dry swallows) to reduce risk of aspiration. Recommend regular diet and thin liquids with no straws, meds whole in applesauce. No SLP f/u needed.     Aspiration Risk  Mild aspiration risk    Diet Recommendation Regular;Thin liquid   Liquid Administration via: Cup;No straw Medication Administration: Whole meds with puree Supervision: Patient able to self feed;Intermittent supervision to cue for compensatory strategies Compensations: Slow rate;Small sips/bites;Follow solids with  liquid;Multiple dry swallows after each bite/sip Postural Changes: Seated upright at 90 degrees    Other  Recommendations Oral Care Recommendations: Oral care BID   Follow up Recommendations  None       Swallow Study   General HPI: Ms. Diane [name] is a 55 year old female with PMH of severe mitral regurgitaion s/p mitral valve repair on 07/22/2015, NICM, chronic combined systolic/diastolic CHF, who presented to [facility] ED after cardiac arrest. Intubated on scene 1/17-1/24. Per MD note, in route to hospital patient vomited with likely aspiration. CXR Low lung volumes with persistent bibasilar atelectasis and/or pulmonary infiltrates/edema.  Type of Study: Bedside Swallow Evaluation Previous Swallow Assessment: none found Diet Prior to this Study: NPO Temperature Spikes Noted: No Respiratory Status: Nasal cannula History of Recent Intubation: Yes Length of Intubations (days): 7 days Date extubated: 08/12/15 Behavior/Cognition: Alert;Cooperative;Pleasant mood Oral Cavity Assessment: Within Functional Limits Oral Care Completed by SLP: Yes Oral Cavity - Dentition: Adequate natural dentition Vision: Functional for self-feeding Self-Feeding Abilities: Able to feed self Patient Positioning: Upright in bed Baseline Vocal Quality: Other (comment);Low vocal intensity;Breathy (dysphonic) Volitional Cough: Weak Volitional Swallow: Able to elicit    Oral/Motor/Sensory Function Overall Oral Motor/Sensory Function: Within functional limits   Ice Chips Ice chips: Not tested   Thin Liquid Thin Liquid: Impaired Presentation: Cup;Straw;Self Fed Pharyngeal  Phase Impairments: Cough - Immediate Other Comments: no straws    Nectar Thick Nectar Thick Liquid: Not tested   Honey Thick Honey Thick Liquid: Not tested   Puree Puree: Within functional limits Presentation: Self Fed;Spoon   Solid   GO   Solid: Within functional limits Presentation: Self Fed        [name] 08/13/2015,9:17 AM   Lynita Lombard, Student-SLP

## 2015-08-13 NOTE — Progress Notes (Signed)
ANTICOAGULATION CONSULT NOTE - Initial Consult  Pharmacy Consult for heparin Indication: hx MVR  Allergies  Allergen Reactions  . Vicodin [Hydrocodone-Acetaminophen] Other (See Comments)    "she feels out of it", hallucinating  . Adhesive [Tape] Itching and Rash  . Silicone Itching and Rash    TAPE ALLERGY/EKG STICKER ALLERGY, Please use "paper" tape only    Patient Measurements: Height: 5\' 2"  (157.5 cm) Weight: 115 lb 8.3 oz (52.4 kg) IBW/kg (Calculated) : 50.1 Heparin Dosing Weight: 52.2kg  Vital Signs: Temp: 98.6 F (37 C) (01/25 1100) Temp Source: Core (Comment) (01/25 0800) BP: 103/46 mmHg (01/25 1100) Pulse Rate: 100 (01/25 1100)  Labs:  Recent Labs  08/11/15 0400 08/12/15 0510 08/13/15 0530  HGB 10.2* 10.1* 10.5*  HCT 29.5* 30.8* 31.6*  PLT 277 290 303  CREATININE 0.73 0.81 0.73    Estimated Creatinine Clearance: 62.1 mL/min (by C-G formula based on Cr of 0.73).   Medical History: Past Medical History  Diagnosis Date  . Shortness of breath 05/29/2015  . Severe mitral regurgitation 07/03/2015  . Chronic combined systolic and diastolic CHF (congestive heart failure) (HCC) 07/03/2015    Grade 3 diastolic dysfunction.  LVEF 40-45% 05/2015.  Marland Kitchen Aortic aneurysm (HCC)   . Patent ductus arteriosus 07/16/2015  . Depression     SOME DEPRESSION (ON MEDS FOR 2 YRS) WHEN HER MOTHER PASSED  . 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 antiseptic oral rinse  7 mL Mouth Rinse BID  . feeding supplement  1 Container Oral BID BM  . furosemide  40 mg Intravenous Daily  . [START ON 08/14/2015] methylPREDNISolone (SOLU-MEDROL) injection  40 mg Intravenous Q24H  . sodium chloride  10-40 mL Intracatheter Q12H    Assessment: 55 yo woman admitted 08/05/2015 for PEA arrest and possible aspiration pna.  Pt was on warfarin PTA for MVR. Originally on heparin gtt which was held for thoracentesis procedure. Pharmacy consulted to restart heparin  PMH severe MR s/p MVR 07/2015, NICM (EF 50-55%), depression, AFib  Goal of Therapy:  Heparin level 0.3-0.7 units/ml Monitor platelets by anticoagulation protocol: Yes   Plan:  Start Heparin at 750 units/h Obtain 6 hr HL Daily HL/CBC Monitor s/sx bleeding  Remi Haggard, PharmD Clinical Pharmacist- Resident Pager: [phone]   08/13/2015,12:47 PM

## 2015-08-13 NOTE — Progress Notes (Signed)
eLink Physician-Brief Progress Note Patient Name: Zada Hultin DOB: Apr 14, 1961 MRN: [id number]   Date of Service  08/13/2015  HPI/Events of Note  K+ = 3.4 and Creatinine = 0.81.  eICU Interventions  Will replete K+.     Intervention Category Intermediate Interventions: Electrolyte abnormality - evaluation and management  Sommer,Steven Eugene 08/13/2015, 6:06 AM

## 2015-08-13 NOTE — Progress Notes (Signed)
SUBJECTIVE: The patient extubated and able to articulate collapse resuscitation   CURRENT MEDICATIONS: . antiseptic oral rinse  7 mL Mouth Rinse BID  . feeding supplement  1 Container Oral BID BM  . furosemide  40 mg Intravenous Daily  . [START ON 08/14/2015] methylPREDNISolone (SOLU-MEDROL) injection  40 mg Intravenous Q24H  . sodium chloride  10-40 mL Intracatheter Q12H   . heparin 750 Units/hr (08/13/15 1508)    OBJECTIVE: Physical Exam: Filed Vitals:   08/13/15 1300 08/13/15 1400 08/13/15 1500 08/13/15 1600  BP: 116/56 127/65 102/57 97/57  Pulse: 94 97 95 95  Temp: 98.6 F (37 C)   98.2 F (36.8 C)  TempSrc:    Oral  Resp:      Height:      Weight:      SpO2: 100% 100% 100% 100%    Intake/Output Summary (Last 24 hours) at 08/13/15 1751 Last data filed at 08/13/15 1600  Gross per 24 hour  Intake  511.5 ml  Output   2225 ml  Net -1713.5 ml    Telemetry reveals sinus rhythm  GEN- The patient awake and appropirate haorse Head- normocephalic, atraumatic Eyes-  Sclera clear, conjunctiva pink Ears- hearing intact Oropharynx- +ETT Neck- supple  Lungs- Clear to ausculation bilaterally, normal work of breathing Heart- Regular rate and rhythm  GI- soft, NT, ND, + BS Extremities- no clubbing, cyanosis, or edema Skin- no rash or lesion  LABS: Basic Metabolic Panel:  Recent Labs  16/10/96 0510 08/13/15 0530  NA 134* 134*  K 3.4* 3.0*  CL 92* 86*  CO2 26 32  GLUCOSE 102* 104*  BUN 17 29*  CREATININE 0.81 0.73  CALCIUM 8.7* 8.8*  MG 2.3 2.6*  PHOS 4.2 4.2   Liver Function Tests:  Recent Labs  08/12/15 0510 08/13/15 0530  AST 99* 68*  ALT 243* 181*  ALKPHOS 106 97  BILITOT 1.7* 1.4*  PROT 6.4* 6.9  ALBUMIN 2.7* 2.9*   CBC:  Recent Labs  08/12/15 0510 08/13/15 0530  WBC 6.2 7.2  HGB 10.1* 10.5*  HCT 30.8* 31.6*  MCV 92.2 93.2  PLT 290 303   Fasting Lipid Panel:  Recent Labs  08/11/15 1544  CHOL 161    RADIOLOGY: Dg Chest  Port 1 View 08/12/2015  CLINICAL DATA:  Endotracheal tube.  Shortness of breath. EXAM: PORTABLE CHEST 1 VIEW COMPARISON:  08/11/2015 FINDINGS: Endotracheal tube with tip measuring 4.5 cm above the carina. Enteric tube tip is off the field of view but below the left hemidiaphragm. Left central venous catheter with tip over the low SVC region. Normal heart size and pulmonary vascularity. Shallow inspiration. Small bilateral pleural effusions with basilar atelectasis or infiltration. No definite change since previous study, allowing for differences in technique. Postoperative changes in the mediastinum. IMPRESSION: Appliances appear to be in satisfactory location. Small bilateral pleural effusions with basilar atelectasis or infiltration. Electronically Signed   By: Burman Nieves M.D.   On: 08/12/2015 03:39    ASSESSMENT AND PLAN:  Principal Problem:   Asystole (HCC) Active Problems:   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   Long term (current) use of anticoagulants [Z79.01]   Cardiac arrest (HCC)   Respiratory arrest (HCC)   Cardiogenic shock (HCC)   Central line insertion site infection   Congestive heart failure (HCC)   Hypokalemia   Long QT interval  1.  Cardiac arrest  EMS records reviewed personanlly   First rhythm asystole ER notes  refer to VT  I CANT FIND THESE STRIPS  CARDIOMYOPATHY WITH 2 MR S/P REPAIR  QT prolongation   Hypokalemia  It is not altogether clear the risks going forward She will at least need pacing Will await MR to look at LV structure and function  wiill continue to try and find ER strips This may inform the need for ICD vs pacer  BP low, but as improves would anticpate BB and ACE  She also has at baseline significant QT prolongation with QTc around 490  We need to be aggressive with potassium and magnesium repletion   Will recheck tonight

## 2015-08-13 NOTE — Progress Notes (Signed)
PULMONARY / CRITICAL CARE MEDICINE   Name: Diane [name] MRN: [id number] DOB: 1960/08/09    ADMISSION DATE:  08/05/2015 CONSULTATION DATE:  08/05/15  REFERRING MD :  EDP PRIMARY SERVICE: PCCM   BRIEF PATIENT DESCRIPTION: 54-yo F with MVR repair on 07/22/15 found to be in PEA/VT arrest with 3 shocks and ROSC after 7-9 minutes requiring hypothermic protocol.   SIGNIFICANT EVENTS / STUDIES:  1/17 Admitted for PEA/VT arrest, intubated, placed on hypothermic protocol   1/17 CTA chest with no PO, moderate bilateral pleural effusions and consolidative changes of lower lobes 1/17 Echo with normal EF, mild LVH, mild LAW, trace AI, mild MS, no MR, small pericardial effusion, left pleural effusion 1/18 EEG with generalized slowing  1/19 Rewarmed, paralytics, sedation turned off 1/19 CT head normal 1/20 CXR with left sided pleural effusion and b/l pulmonary edema  1/20 INR 9.4 requiring FFP and vitamin K 1/20 IV amio stopped  1/20 EEG with generalized slowing  1/21 Improved mental status, start IV heparin  1/22 Vomited tube feeds, KUB normal  1/23 Thoracentesis of large left effusion with 950 cc semi-hemorrhagic fluid. IV heparin stopped. 1/23 2D-echo with EF 55-60% MV with no significant regurgitation  1/24- extubated  LINES / TUBES: 1/17 ETT >>1/24 1/17 Central line >> 1/18 OG >>1/24 1/18 Foley >> 1/18 Aline >>>1/24  CULTURES: 1/18 Blood >> Neg 1/18 Tracheal >> normal flora 1/18 Urine >> Neg 1/18 MRSA >> neg  1/23 Left pleural fluid >> 1/23 Left pleural fungal >>  ANTIBIOTICS: 1/18 Unasyn >>1/24   SUBJECTIVE:  No acute events overnight. Was extubated yesterday and currently on 2L [place].  Reports voice hoarseness but otherwise with no complaints. BP stable with MAP >65. Alert and orientated with mild memory loss per nurse.   VITAL SIGNS: Temp:  [98.1 F (36.7 C)-98.6 F (37 C)] 98.1 F (36.7 C) (01/25 0700) Pulse Rate:  [95-99] 96 (01/25 0700) Resp:  [10-23] 14 (01/25  0700) BP: (92-110)/(48-58) 104/58 mmHg (01/25 0700) SpO2:  [100 %] 100 % (01/25 0700) Arterial Line BP: (90-115)/(44-59) 113/54 mmHg (01/24 1100) FiO2 (%):  [30 %] 30 % (01/24 0900) HEMODYNAMICS: CVP:  [2 mmHg-4 mmHg] 2 mmHg VENTILATOR SETTINGS: Vent Mode:  [-]  FiO2 (%):  [30 %] 30 % INTAKE / OUTPUT: Intake/Output      01/24 0701 - 01/25 0700 01/25 0701 - 01/26 0700   I.V. (mL/kg) 240 (4.6)    IV Piggyback 350    Total Intake(mL/kg) 590 (11.3)    Urine (mL/kg/hr) 2350 (1.9)    Total Output 2350     Net -1760            PHYSICAL EXAMINATION: General:  NAD, pleasant  Neuro: A & O x 2 (not time time), conversant  HEENT: Normocephalic, voice hoarse Cardiovascular:  Normal rate and rhythm with no murmur  Lungs: Clear to auscultation in anterior fields  Abdomen: Non-tender, nondistended, normal BS Musculoskeletal:  SCD's on Skin:  No rashes  LABS:  CBC  Recent Labs Lab 08/11/15 0400 08/12/15 0510 08/13/15 0530  WBC 7.2 6.2 7.2  HGB 10.2* 10.1* 10.5*  HCT 29.5* 30.8* 31.6*  PLT 277 290 303   Coag's  Recent Labs Lab 08/08/15 0410 08/08/15 1649 08/09/15 0430  INR 9.40* 1.53* 1.41   BMET  Recent Labs Lab 08/11/15 0400 08/12/15 0510 08/13/15 0530  NA 133* 134* 134*  K 3.0* 3.4* 3.0*  CL 92* 92* 86*  CO2 25 26 32  BUN 10 17 29*  CREATININE 0.73 0.81 0.73  GLUCOSE 105* 102* 104*   Electrolytes  Recent Labs Lab 08/11/15 0400 08/12/15 0510 08/13/15 0530  CALCIUM 8.1* 8.7* 8.8*  MG 1.9 2.3 2.6*  PHOS 3.1 4.2 4.2   Sepsis Markers  Recent Labs Lab 08/11/15 0400  PROCALCITON 0.11   ABG  Recent Labs Lab 08/07/15 1132 08/08/15 0409 08/09/15 0310  PHART 7.444 7.379 7.499*  PCO2ART 26.6* 29.7* 29.2*  PO2ART 122.0* 133* 172*   Liver Enzymes  Recent Labs Lab 08/11/15 0400 08/12/15 0510 08/13/15 0530  AST 149* 99* 68*  ALT 327* 243* 181*  ALKPHOS 106 106 97  BILITOT 1.8* 1.7* 1.4*  ALBUMIN 2.7* 2.7* 2.9*   Cardiac  Enzymes  Recent Labs Lab 08/06/15 0845  TROPONINI 0.65*   Glucose  Recent Labs Lab 08/12/15 0807 08/12/15 1308 08/12/15 1625 08/12/15 2018 08/13/15 0043 08/13/15 0533  GLUCAP 103* 94 91 113* 101* 98    Imaging Dg Chest Port 1 View  08/13/2015  CLINICAL DATA:  Shortness of breath.  Pleural effusion. EXAM: PORTABLE CHEST 1 VIEW COMPARISON:  08/12/2015. FINDINGS: Interim extubation removal of NG tube. Left IJ line in stable position. Prior CABG and cardiac valve replacement. Left atrial appendage clip noted. Stable mild cardiomegaly. No pulmonary venous congestion. Low lung volumes with bibasilar atelectasis and/or bilateral infiltrates/edema. Small bilateral pleural effusions. IMPRESSION: 1. Interim extubation removal of NG tube. Left IJ line in stable position. 2. Prior CABG and cardiac valve replacement. Left atrial appendage clip noted. Stable cardiomegaly. No pulmonary venous congestion. 3. Low lung volumes with persistent bibasilar atelectasis and/or pulmonary infiltrates/edema. Persistent bilateral pleural effusions. No interim change. Electronically Signed   By: Maisie Fus  Register   On: 08/13/2015 07:06   Dg Chest Port 1 View  08/12/2015  CLINICAL DATA:  Endotracheal tube.  Shortness of breath. EXAM: PORTABLE CHEST 1 VIEW COMPARISON:  08/11/2015 FINDINGS: Endotracheal tube with tip measuring 4.5 cm above the carina. Enteric tube tip is off the field of view but below the left hemidiaphragm. Left central venous catheter with tip over the low SVC region. Normal heart size and pulmonary vascularity. Shallow inspiration. Small bilateral pleural effusions with basilar atelectasis or infiltration. No definite change since previous study, allowing for differences in technique. Postoperative changes in the mediastinum. IMPRESSION: Appliances appear to be in satisfactory location. Small bilateral pleural effusions with basilar atelectasis or infiltration. Electronically Signed   By: Burman Nieves M.D.   On: 08/12/2015 03:39   Dg Chest Port 1 View  08/11/2015  CLINICAL DATA:  Status post left-sided thoracentesis EXAM: PORTABLE CHEST 1 VIEW COMPARISON:  Study obtained earlier in the day FINDINGS: Left effusion is smaller following thoracentesis. No pneumothorax. Endotracheal tube tip is 3.6 cm above the carina. Central catheter tip is in the superior vena cava. Nasogastric tube tip and side port are below the diaphragm in the stomach. There remains a fairly small left effusion. There is also a small right effusion. There is moderate atelectatic change in the left base and medial right base regions. Heart is mildly enlarged. Patient has a left atrial appendage clamp. The patient is status post mitral valve replacement. IMPRESSION: Left effusion smaller following thoracentesis. No pneumothorax. There is a small left effusion with small right effusion is well. Bibasilar atelectatic change, more on the left than on the right. Stable cardiac silhouette. Tube and catheter positions as described without pneumothorax. Electronically Signed   By: Bretta Bang III M.D.   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  Bibasilar atelectasis  P:  Oxygen therapy to keep SpO2 >92% Incentive spirometry Q 4 hr while awake IV lasix 60 mg BID, consider decreasing or d/c Continue solumedrol 40 mg BID Follow-up pleural fluid studies  CARDIOVASCULAR A:  Hypotension requiring pressor support - off levo  PEA and VT arrest with ROSC 7-9 minutes s/p hypothermic protocol in setting of junctional rhythm  Severe MR s/p repair (07/22/15) - echo with no regurgitation  Atrial fibrillation s/p  LAA clipping (07/22/15) 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% on 08/05/15) - resolved net -6.4 L  P:  EP and cardiology following  Goal MAP >60 IV lasix 60 mg BID, consider decreasing or d/c Holding amiodarone  Consider restarting IV heparin   Cardiac MRI today Needs dual chamber ICD before discharge, d'c central line  RENAL A: Mild hyponatremia - 134 Hypokalemia - 3.0 P:   Monitor BMP and UOP Replete K Reduce lasix  GASTROINTESTINAL A: Elevated AST/ALT in setting of ischemia - improving  Hyperbilirubinemia - improving Nutrition  GI PPx P:   SLP to see today Monitor CMP D/c IV protonix 40 mg daily  HEMATOLOGIC A:  Hemorrhagic pleural effusion Chronic AC therapy in setting of afib  Supratheraputeuic INR with no bleeding - resolved  Acute normocytic anemia - no active bleeding P:  Consider restarting IV heparin  Monitor CBC   INFECTIOUS A:  Aspiration pneumonia s/p 7 days of unasyn- resolved on CXR Leukocytosis - resolved  P:   Monitor for infection Dc lines  ENDOCRINE A:  Hyperglycemia   Prediabetes - A1c 5.8 on 07/17/15  P:   SSI insulin with goal CBG <180  NEUROLOGIC A:  Anoxic brain injury s/p cardiac arrest and warming with CPR 7-9 min - significantly improved  P:   D/c Fentanyl Q 2 hr PRN   Can move out of ICU today   Marjan Rabbani  PGY-3 IMTS Pager [phone]   08/13/2015, 8:01 AM   STAFF NOTE: Cindi Carbon, MD FACP have personally reviewed patient's available data, including medical history, events of note, physical examination and test results as part of my evaluation. I have discussed with resident/NP and other care providers such as pharmacist, RN and RRT. In addition, I personally evaluated patient and elicited key findings of: extubated, no distress, apears well, lung sounds improving, edema better, lasix redcution, dc lines for icd soon, eating now after swallow, likely some dressler, redceu steroids and treat for 5 days , effusion  has not returned, can re add heparin for now and watch pcxr and clinical status and hct, to sdu, dc foley, to triad as primary unless cards can serve this role  Mcarthur Rossetti. Tyson Alias, MD, FACP Pgr: [phone] [facility] 08/13/2015 12:03 PM

## 2015-08-13 NOTE — Progress Notes (Signed)
Nutrition Follow-up  INTERVENTION:   Boost Breeze po BID, each supplement provides 250 kcal and 9 grams of protein  NUTRITION DIAGNOSIS:   Inadequate oral intake related to poor appetite as evidenced by meal completion < 50%. Ongoing.   GOAL:   Patient will meet greater than or equal to 90% of their needs Not yet met.   MONITOR:   PO intake, Supplement acceptance, I & O's, Labs  ASSESSMENT:   55 y.o. female w/ PMHx of severe MR s/p repair 07/22/15, NICM, chronic combined CHF, presented to [facility] ED after cardiac arrest.  Labs reviewed: sodium low 134, potassium low 3.0, magnesium elevated 2.6 Meal Completion: 50% Pt reports decreased appetite. Willing to try Breeze.  1/23 950 ml thoracentesis of large left effusion 1/24 extubated  Diet Order:  Diet regular Room service appropriate?: Yes; Fluid consistency:: Thin  Skin:  Reviewed, no issues  Last BM:  1/25  Height:   Ht Readings from Last 1 Encounters:  08/09/15 '5\' 2"'  (1.575 m)   Weight:   Wt Readings from Last 1 Encounters:  08/12/15 115 lb 8.3 oz (52.4 kg)   Ideal Body Weight:  50 kg  BMI:  Body mass index is 21.12 kg/(m^2).  Estimated Nutritional Needs:   Kcal:  1500-1700  Protein:  75-90 grams  Fluid:  > 1.5 L/day  EDUCATION NEEDS:   No education needs identified at this time  [place], [place], [place] Pager [phone] After Hours Pager

## 2015-08-13 NOTE — Evaluation (Signed)
Physical Therapy Evaluation Patient Details Name: Diane [name] MRN: [id number] DOB: October 12, 1960 Today's Date: 08/13/2015   History of Present Illness  on 07/22/15 presents back on 08/05/15 to be in PEA/VT arrest with 3 shocks and ROSC after 7-9 minutes requiring hypothermic protocol. Patient developed large left effusion in setting resp failure and was extubated on 1/24.   Clinical Impression  Patient demonstrates deficits in functional mobility as indicated below. Will need continued skilled PT to address deficits and maximize function. Will see as indicated and progress as tolerated. Limited evaluation secondary to continual BP drop. At rest BP 122/86 HR 98, in sitting dropped 92/39 with patient complaining of significant dizziness, this did not subside and BP continued to drop to 83/54. Patient returned to bed dizziness improved and patient placed in modified chair position.  Nsg notified of changes.    Follow Up Recommendations Home health PT;Supervision/Assistance - 24 hour    Equipment Recommendations  None recommended by PT    Recommendations for Other Services       Precautions / Restrictions Precautions Precautions: Fall (low BP) Restrictions Weight Bearing Restrictions: No      Mobility  Bed Mobility Overal bed mobility: Needs Assistance Bed Mobility: Rolling;Supine to Sit;Sit to Supine Rolling: Min assist   Supine to sit: Min guard Sit to supine: Min guard   General bed mobility comments: VCs for positioning, min guard for safety.  Transfers                    Ambulation/Gait             General Gait Details: defferred secondary to BP drop  Stairs            Wheelchair Mobility    Modified Rankin (Stroke Patients Only)       Balance Overall balance assessment: Needs assistance Sitting-balance support: Bilateral upper extremity supported;Feet supported Sitting balance-Leahy Scale: Fair                                      Pertinent Vitals/Pain Pain Assessment: No/denies pain    Home Living Family/patient expects to be discharged to:: Private residence Living Arrangements: Alone Available Help at Discharge: Family (husband and daughter) Type of Home: House Home Access: Stairs to enter   Entrance Stairs-Number of Steps: 3 Home Layout: Two level;Able to live on main level with bedroom/bathroom Home Equipment: None      Prior Function                 Hand Dominance        Extremity/Trunk Assessment   Upper Extremity Assessment: Defer to OT evaluation           Lower Extremity Assessment: Generalized weakness (overall symetrical strength )         Communication   Communication:  (decreased phonation)  Cognition Arousal/Alertness: Awake/alert Behavior During Therapy: WFL for tasks assessed/performed Overall Cognitive Status: Impaired/Different from baseline Area of Impairment: Safety/judgement;Problem solving         Safety/Judgement: Decreased awareness of safety   Problem Solving: Requires verbal cues General Comments: alert and oriented    General Comments General comments (skin integrity, edema, etc.): patient low BP when attempting mobility, tolerated EOB >3 minutes but + dizziness and BP continued to drop further, patient returned to bed an dplaced in chair position.    Exercises        Assessment/Plan  PT Assessment Patient needs continued PT services  PT Diagnosis Difficulty walking;Generalized weakness   PT Problem List Decreased strength;Decreased range of motion;Decreased activity tolerance;Decreased balance;Decreased mobility;Decreased cognition;Decreased safety awareness;Cardiopulmonary status limiting activity  PT Treatment Interventions DME instruction;Gait training;Stair training;Functional mobility training;Therapeutic activities;Therapeutic exercise;Balance training;Patient/family education   PT Goals (Current goals can be found in the Care  Plan section)      Frequency Min 3X/week   Barriers to discharge        Co-evaluation               End of Session   Activity Tolerance: No increased pain;Treatment limited secondary to medical complications (Comment) (significant drop in BP and positive dizziness) Patient left: in bed;with call bell/phone within reach;with family/visitor present (in chair position) Nurse Communication: Mobility status         Time: 1610-9604 PT Time Calculation (min) (ACUTE ONLY): 19 min   Charges:   PT Evaluation $PT Eval Moderate Complexity: 1 Procedure     PT G CodesFabio Asa 09/02/2015, 1:22 PM Charlotte Crumb, PT DPT  [phone]

## 2015-08-14 ENCOUNTER — Ambulatory Visit: Payer: BLUE CROSS/BLUE SHIELD | Admitting: Cardiovascular Disease

## 2015-08-14 ENCOUNTER — Encounter (HOSPITAL_COMMUNITY): Payer: Self-pay | Admitting: Nurse Practitioner

## 2015-08-14 ENCOUNTER — Inpatient Hospital Stay (HOSPITAL_COMMUNITY): Payer: BLUE CROSS/BLUE SHIELD

## 2015-08-14 DIAGNOSIS — T17998D Other foreign object in respiratory tract, part unspecified causing other injury, subsequent encounter: Secondary | ICD-10-CM

## 2015-08-14 DIAGNOSIS — I469 Cardiac arrest, cause unspecified: Secondary | ICD-10-CM

## 2015-08-14 DIAGNOSIS — I5043 Acute on chronic combined systolic (congestive) and diastolic (congestive) heart failure: Secondary | ICD-10-CM

## 2015-08-14 LAB — COMPREHENSIVE METABOLIC PANEL
ALBUMIN: 3 g/dL — AB (ref 3.5–5.0)
ALT: 131 U/L — ABNORMAL HIGH (ref 14–54)
ANION GAP: 12 (ref 5–15)
AST: 50 U/L — AB (ref 15–41)
Alkaline Phosphatase: 92 U/L (ref 38–126)
BUN: 30 mg/dL — AB (ref 6–20)
CHLORIDE: 88 mmol/L — AB (ref 101–111)
CO2: 34 mmol/L — ABNORMAL HIGH (ref 22–32)
Calcium: 8.9 mg/dL (ref 8.9–10.3)
Creatinine, Ser: 0.65 mg/dL (ref 0.44–1.00)
GFR calc Af Amer: >60 mL/min (ref >60)
GFR calc non Af Amer: >60 mL/min (ref >60)
GLUCOSE: 110 mg/dL — AB (ref 65–99)
POTASSIUM: 3.3 mmol/L — AB (ref 3.5–5.1)
SODIUM: 134 mmol/L — AB (ref 135–145)
Total Bilirubin: 1.1 mg/dL (ref 0.3–1.2)
Total Protein: 6.5 g/dL (ref 6.5–8.1)

## 2015-08-14 LAB — CBC
HCT: 31.9 % — ABNORMAL LOW (ref 36.0–46.0)
Hemoglobin: 10.3 g/dL — ABNORMAL LOW (ref 12.0–15.0)
MCH: 30.3 pg (ref 26.0–34.0)
MCHC: 32.3 g/dL (ref 30.0–36.0)
MCV: 93.8 fL (ref 78.0–100.0)
PLATELETS: 298 10*3/uL (ref 150–400)
RBC: 3.4 MIL/uL — ABNORMAL LOW (ref 3.87–5.11)
RDW: 14.6 % (ref 11.5–15.5)
WBC: 10.9 10*3/uL — AB (ref 4.0–10.5)

## 2015-08-14 LAB — BODY FLUID CULTURE
Culture: NO GROWTH
SPECIAL REQUESTS: NORMAL

## 2015-08-14 LAB — CHOLESTEROL, BODY FLUID: Cholesterol, Fluid: 63 mg/dL

## 2015-08-14 LAB — HEPARIN LEVEL (UNFRACTIONATED): HEPARIN UNFRACTIONATED: 0.41 [IU]/mL (ref 0.30–0.70)

## 2015-08-14 LAB — PHOSPHORUS: Phosphorus: 3 mg/dL (ref 2.5–4.6)

## 2015-08-14 LAB — MAGNESIUM: Magnesium: 2.5 mg/dL — ABNORMAL HIGH (ref 1.7–2.4)

## 2015-08-14 MED ORDER — POTASSIUM CHLORIDE CRYS ER 20 MEQ PO TBCR: 40.0000 meq | EXTENDED_RELEASE_TABLET | Freq: Once | ORAL | Status: DC

## 2015-08-14 MED ORDER — PANTOPRAZOLE SODIUM 40 MG PO TBEC
40.0000 mg | DELAYED_RELEASE_TABLET | Freq: Every day | ORAL | Status: DC
  Administered 2015-08-16 – 2015-08-29 (×14): 40 mg via ORAL
  Filled 2015-08-14 (×14): qty 1

## 2015-08-14 MED ORDER — METHYLPREDNISOLONE SODIUM SUCC 40 MG IJ SOLR
40.0000 mg | INTRAMUSCULAR | Status: AC
  Administered 2015-08-15: 40 mg via INTRAVENOUS
  Filled 2015-08-14: qty 1

## 2015-08-14 MED ORDER — CHLORHEXIDINE GLUCONATE 4 % EX LIQD
60.0000 mL | Freq: Once | CUTANEOUS | Status: AC
  Administered 2015-08-15: 4 via TOPICAL

## 2015-08-14 MED ORDER — POTASSIUM CHLORIDE CRYS ER 20 MEQ PO TBCR
40.0000 meq | EXTENDED_RELEASE_TABLET | ORAL | Status: AC
  Administered 2015-08-14: 40 meq via ORAL
  Filled 2015-08-14 (×2): qty 2

## 2015-08-14 MED ORDER — CHLORHEXIDINE GLUCONATE 4 % EX LIQD
60.0000 mL | Freq: Once | CUTANEOUS | Status: AC
  Administered 2015-08-14: 4 via TOPICAL
  Filled 2015-08-14: qty 60

## 2015-08-14 MED ORDER — POTASSIUM CHLORIDE CRYS ER 20 MEQ PO TBCR
40.0000 meq | EXTENDED_RELEASE_TABLET | Freq: Once | ORAL | Status: AC
  Administered 2015-08-14: 40 meq via ORAL

## 2015-08-14 MED ORDER — GADOBENATE DIMEGLUMINE 529 MG/ML IV SOLN
17.0000 mL | Freq: Once | INTRAVENOUS | Status: AC | PRN
  Administered 2015-08-14: 17 mL via INTRAVENOUS

## 2015-08-14 MED ORDER — SODIUM CHLORIDE 0.9 % IV SOLN
INTRAVENOUS | Status: DC
  Administered 2015-08-15: 06:00:00 via INTRAVENOUS

## 2015-08-14 MED ORDER — CEFAZOLIN SODIUM-DEXTROSE 2-3 GM-% IV SOLR
2.0000 g | INTRAVENOUS | Status: AC
  Administered 2015-08-15: 2 g via INTRAVENOUS
  Filled 2015-08-14: qty 50

## 2015-08-14 MED ORDER — SODIUM CHLORIDE 0.9 % IR SOLN
80.0000 mg | Status: AC
  Administered 2015-08-15: 80 mg
  Filled 2015-08-14 (×2): qty 2

## 2015-08-14 NOTE — Progress Notes (Signed)
ANTICOAGULATION CONSULT NOTE  Pharmacy Consult for heparin Indication: hx MVR  Allergies  Allergen Reactions  . Vicodin [Hydrocodone-Acetaminophen] Other (See Comments)    "she feels out of it", hallucinating  . Adhesive [Tape] Itching and Rash  . Silicone Itching and Rash    TAPE ALLERGY/EKG STICKER ALLERGY, Please use "paper" tape only    Patient Measurements: Height: 5\' 2"  (157.5 cm) Weight: 104 lb 15 oz (47.6 kg) IBW/kg (Calculated) : 50.1 Heparin Dosing Weight: 52.2kg  Vital Signs: Temp: 98.7 F (37.1 C) (01/26 0800) Temp Source: Oral (01/26 0800) BP: 96/49 mmHg (01/26 1051) Pulse Rate: 88 (01/26 1051)  Labs:  Recent Labs  08/12/15 0510 08/13/15 0530 08/13/15 2000 08/13/15 2138 08/14/15 0520  HGB 10.1* 10.5*  --   --  10.3*  HCT 30.8* 31.6*  --   --  31.9*  PLT 290 303  --   --  298  HEPARINUNFRC  --   --  0.30  --  0.41  CREATININE 0.81 0.73  --  0.63 0.65    Estimated Creatinine Clearance: 59 mL/min (by C-G formula based on Cr of 0.65).   Medical History: Past Medical History  Diagnosis Date  . Shortness of breath 05/29/2015  . Severe mitral regurgitation 07/03/2015  . Chronic combined systolic and diastolic CHF (congestive heart failure) (HCC) 07/03/2015    Grade 3 diastolic dysfunction.  LVEF 40-45% 05/2015.  Marland Kitchen Aortic aneurysm (HCC)   . Patent ductus arteriosus 07/16/2015  . Depression     SOME DEPRESSION (ON MEDS FOR 2 YRS) WHEN HER MOTHER PASSED  . 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 antiseptic oral rinse  7 mL Mouth Rinse BID  . feeding supplement  1 Container Oral BID BM  . furosemide  40 mg Intravenous Daily  . methylPREDNISolone (SOLU-MEDROL) injection  40 mg Intravenous Q24H  . potassium chloride  40 mEq Oral 6 times per day    Assessment: 55 yo woman  admitted 08/05/2015 for PEA arrest and possible aspiration pna. Pt was on warfarin PTA for MVR. Now on IV heparin, s/p thoracentesis procedure. ICD implant tomorrow. Heparin level 0.41, therapeutic on 750 units/hr. hgb 10.3, plt 298K. No bleeding noted per chart.  Goal of Therapy:  Heparin level 0.3-0.7 units/ml Monitor platelets by anticoagulation protocol: Yes   Plan:  Continue heparin at 750 units/h Daily HL/CBC Monitor s/sx bleeding F/u plans for restarting coumadin after procedures.  Bayard Hugger, PharmD, BCPS  Clinical Pharmacist  Pager: [phone]  08/14/2015,11:38 AM

## 2015-08-14 NOTE — Progress Notes (Signed)
BP remains low at 87/50 (MAP=61).  Elink MD notified.  Pt only c/o being very weak when standing.  Will continue to monitor.

## 2015-08-14 NOTE — Progress Notes (Signed)
eLink Physician-Brief Progress Note Patient Name: Diane [name] DOB: 1961-03-12 MRN: [id number]   Date of Service  08/14/2015  HPI/Events of Note  Called for low SBP 80's, MAP 60 Asymptomatic at this time  eICU Interventions  No interventions, no IVF's at this time, Continue to monitor        Alsie Younes 08/14/2015, 1:44 AM

## 2015-08-14 NOTE — Progress Notes (Signed)
Patient desired to get OOB with assistance to stand at the sink and brush her teeth at approximately 21:00. She tolerated ambulating from bed to sink with little difficulty and minimal assistance. However, on returning to bed, she became very weak and light-headed; required maximum assistance for the last few steps and to get back into bed. The patient did not lose consciousness and was able to answer questions appropriately throughout the event. She stated she was "very tired." BP immediately on return to bed was not measurable by device. By palpation, systolic BP was near 70, unable to obtain diastolic pressure. Heart rate/rhythm was unchanged from previously noted:  80-90, sinus rhythm with 1st degree AV block.  After several minutes of laying flat in bed, she appeared and felt much better.  Will continue to monitor closely.

## 2015-08-14 NOTE — Progress Notes (Signed)
SUBJECTIVE: The patient is doing well today.  At this time, she denies chest pain, shortness of breath, or any new concerns.  Hoarse s/p extubation.   CURRENT MEDICATIONS: . antiseptic oral rinse  7 mL Mouth Rinse BID  . [START ON 08/15/2015]  ceFAZolin (ANCEF) IV  2 g Intravenous To Cath  . chlorhexidine  60 mL Topical Once  . [START ON 08/15/2015] chlorhexidine  60 mL Topical Once  . feeding supplement  1 Container Oral BID BM  . furosemide  40 mg Intravenous Daily  . [START ON 08/15/2015] gentamicin irrigation  80 mg Irrigation To Cath  . methylPREDNISolone (SOLU-MEDROL) injection  40 mg Intravenous Q24H  . potassium chloride  40 mEq Oral Once   . [START ON 08/15/2015] sodium chloride      OBJECTIVE: Physical Exam: Filed Vitals:   08/14/15 0800 08/14/15 1051 08/14/15 1200 08/14/15 1615  BP: 104/49 96/49 103/57 87/33  Pulse: 85 88 91 85  Temp: 98.7 F (37.1 C)  98.3 F (36.8 C) 98.2 F (36.8 C)  TempSrc: Oral  Oral Oral  Resp: Height:      Weight:      SpO2: 92%  97% 95%    Intake/Output Summary (Last 24 hours) at 08/14/15 1755 Last data filed at 08/14/15 1300  Gross per 24 hour  Intake  472.5 ml  Output   1150 ml  Net -677.5 ml    Telemetry reveals sinus rhythm  GEN- The patient is well appearing, alert and oriented x 3 today.   Head- normocephalic, atraumatic Eyes-  Sclera clear, conjunctiva pink Ears- hearing intact Oropharynx- clear Neck- supple  Lungs- Clear to ausculation bilaterally, normal work of breathing Heart- Regular rate and rhythm  GI- soft, NT, ND, + BS Extremities- no clubbing, cyanosis, or edema Skin- no rash or lesion Psych- euthymic mood, full affect Neuro- strength and sensation are intact  LABS: Basic Metabolic Panel:  Recent Labs  16/10/96 0530 08/13/15 2138 08/14/15 0520  NA 134* 133* 134*  K 3.0* 3.6 3.3*  CL 86* 88* 88*  CO2 32 34* 34*  GLUCOSE 104* 132* 110*  BUN 29* 27* 30*  CREATININE 0.73 0.63 0.65    CALCIUM 8.8* 8.8* 8.9  MG 2.6*  --  2.5*  PHOS 4.2  --  3.0   Liver Function Tests:  Recent Labs  08/13/15 0530 08/14/15 0520  AST 68* 50*  ALT 181* 131*  ALKPHOS 97 92  BILITOT 1.4* 1.1  PROT 6.9 6.5  ALBUMIN 2.9* 3.0*   CBC:  Recent Labs  08/13/15 0530 08/14/15 0520  WBC 7.2 10.9*  HGB 10.5* 10.3*  HCT 31.6* 31.9*  MCV 93.2 93.8  PLT 303 298    ASSESSMENT AND PLAN:  Principal Problem:   Asystole (HCC) Active Problems:   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   Long term (current) use of anticoagulants [Z79.01]   Cardiac arrest (HCC)   Respiratory arrest (HCC)   Cardiogenic shock (HCC)   Central line insertion site infection   Congestive heart failure (HCC)   Hypokalemia   Long QT interval   Valvular cardiomyopathy (HCC)   1. Cardiac arrest EMS records describe both PEA as well as VT during arrest Cardiac MRI planned for today Plan for dual chamber ICD tomorrow Zuzu Befort discontinue Heparin and resume Warfarin tomorrow post procedure  2.  Paroxysmal atrial fibrillation Noted post arrest Would resume Warfarin for the short term CHADS2VASC is  1 - likely can discontinue at follow up  3.  MR s/p MVR Stable  Gypsy Balsam, NP 08/14/2015 5:57 PM  I have seen and examined this patient with Gypsy Balsam.  Agree with above, note added to reflect my findings.  On exam, regular rhythm, no murmurs, lungs clear.  Had cardiac arrest with bradycardia, then VT.  Has had MRI with LGE and EF 45%.  Plan for dual chamber ICD placement for secondary prevention today.  Risks explained including bleeding, infection, tamponade, pneumothorax.  Patient understands the risks, Rada Zegers plan for this afternoon.    Caprisha Bridgett M. Danzel Marszalek MD 08/15/2015 12:48 PM

## 2015-08-14 NOTE — Progress Notes (Signed)
Physical Therapy Treatment Patient Details Name: Diane [name] MRN: [id number] DOB: [birth year] Today's Date: 08/14/2015    History of Present Illness on 07/22/15 presents back on 08/05/15 to be in PEA/VT arrest with 3 shocks and ROSC after 7-9 minutes requiring hypothermic protocol. Patient developed large left effusion in setting resp failure and was extubated on 1/24.     PT Comments    Pt progressing well towards goals as evident by increased activity tolerance and improve BP during interventions.  Pt motivated to gain function and muscle strength.  Pt req education to maintain sternal precautions during mobility.    Follow Up Recommendations  Home health PT;Supervision/Assistance - 24 hour     Equipment Recommendations  None recommended by PT    Recommendations for Other Services       Precautions / Restrictions Precautions Precautions: Sternal (low BP, pt presents with sternal incision and heart pillow.  ) Restrictions Weight Bearing Restrictions: No    Mobility  Bed Mobility Overal bed mobility: Needs Assistance Bed Mobility: Rolling;Supine to Sit;Sit to Supine Rolling: Min assist   Supine to sit: Min guard Sit to supine: Min guard   General bed mobility comments: VCs for positioning, min guard for safety.  Cues to avoid pushing and pulling with B UEs.    Transfers Overall transfer level: Needs assistance Equipment used: None Transfers: Sit to/from Stand Sit to Stand: +2 safety/equipment;Min assist         General transfer comment: Pt req cues for rocking motion forward to create momentum to complete transfer vs. pushing with UEs.    Ambulation/Gait Ambulation/Gait assistance: Min assist Ambulation Distance (Feet): 8 Feet Assistive device: None Gait Pattern/deviations: Step-through pattern;Shuffle;Decreased step length - left;Decreased step length - right     General Gait Details: Pt with mild complaints of dizziness there fore further gt distance deferred  at this time.  Pt req cues for pacing vs. signs of too much.     Stairs            Wheelchair Mobility    Modified Rankin (Stroke Patients Only)       Balance Overall balance assessment: Needs assistance Sitting-balance support: No upper extremity supported Sitting balance-Leahy Scale: Fair       Standing balance-Leahy Scale: Poor                      Cognition Arousal/Alertness: Awake/alert Behavior During Therapy: WFL for tasks assessed/performed;Impulsive Overall Cognitive Status: Impaired/Different from baseline Area of Impairment: Safety/judgement;Problem solving (Pt inconsistent following sternal px despite re-education to comply.  )         Safety/Judgement: Decreased awareness of safety   Problem Solving: Requires verbal cues General Comments: alert and oriented.  Pt highly motivated    Exercises General Exercises - Lower Extremity Ankle Circles/Pumps: AROM;10 reps Long Arc Quad: AROM;10 reps Hip ABduction/ADduction: AROM;10 reps (Pt performed manually resisted hip abd and pillow squeeze.  ) Hip Flexion/Marching: AROM;10 reps    General Comments General comments (skin integrity, edema, etc.): Pt demos improved activity tolerance with mobility.  Pt pressure remains low but hilding during mobility.  Pt able to tolerate short gt distance and progression to seated ther ex.        Pertinent Vitals/Pain Pain Assessment: No/denies pain    Home Living                      Prior Function  PT Goals (current goals can now be found in the care plan section) Acute Rehab PT Goals Patient Stated Goal: to walk Potential to Achieve Goals: Good Progress towards PT goals: Progressing toward goals    Frequency  Min 3X/week    PT Plan      Co-evaluation             End of Session Equipment Utilized During Treatment: Gait belt (heart pillow) Activity Tolerance: Patient limited by fatigue (& mild dizziness.) Patient left:  in chair;with call bell/phone within reach;with chair alarm set     Time: 1020-1044 PT Time Calculation (min) (ACUTE ONLY): 24 min  Charges:  $Therapeutic Exercise: 8-22 mins $Therapeutic Activity: 8-22 mins                    G Codes:      Florestine Avers 21-Aug-2015, 11:06 AM  Joycelyn Rua, PTA

## 2015-08-14 NOTE — Progress Notes (Signed)
[facility] PROGRESS NOTE  Diane [name] VKF:[id number] DOB: [birth year] DOA: 08/05/2015 PCP: Emeterio Reeve, MD  Assessment/Plan: 1-VDRF in setting of PEA: -extubated on 1/24 -no SOB and good O2 sat on RA -continue flutter valve/IS -overall stable. 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: -improved/resolving -will continue titration down of lasix -good O2 sat on RA -cardiology on board; will follow rec's  4-atrial fibrillation: S/P LAA clipping 07/22/15  -rate controlled at this time -will follow cardiology rec's -on heparin and with plans to start coumadin -CHADsVASC score 0-1 -will not need anticoagulation long term  5-hypokalemia -will replete as needed -BMET in am  6-transaminitis: in the setting of PEA -will monitor trend -trending down -no icterus  7-normocitic anemia: will monitor trend -no signs of overt bleeding currently -Hgb 10.3  8-GI prophylaxis: continue PPI    Code Status: Full Family Communication: no family at bedside  Disposition Plan: remains inpatient; plan is for cardiac MRI today 1/26 and dual chamber ICD on 1/27. Will follow cardiology rec's. Continue working with PT.   Consultants:  PCCM  Cardiology service   Procedures and significant events: 1/17 Admitted for PEA/VT arrest, intubated, placed on hypothermic protocol  1/17 CTA chest with no PO, moderate bilateral pleural effusions and consolidative changes of lower lobes 1/17 Echo with normal EF, mild LVH, mild LAW, trace AI, mild MS, no MR, small pericardial effusion, left pleural effusion 1/18 EEG with generalized slowing  1/19 Rewarmed, paralytics, sedation turned off 1/19 CT head normal 1/20 CXR with left sided pleural  effusion and b/l pulmonary edema  1/20 INR 9.4 requiring FFP and vitamin K 1/20 IV amio stopped  1/20 EEG with generalized slowing  1/21 Improved mental status, start IV heparin  1/22 Vomited tube feeds, KUB normal  1/23 Thoracentesis of large left effusion with 950 cc semi-hemorrhagic fluid. IV heparin stopped. 1/23 2D-echo with EF 55-60% MV with no significant regurgitation  1/24- extubated  Antibiotics:  Unasyn 1/18>>>1/24   HPI/Subjective: Afebrile, no CP, no SOB. Hoarse from intubation. No acute distress.  Objective: Filed Vitals:   08/14/15 1615 08/14/15 2002  BP: 87/33 94/49  Pulse: 85 92  Temp: 98.2 F (36.8 C) 98.6 F (37 C)  Resp: 18 17    Intake/Output Summary (Last 24 hours) at 08/14/15 2011 Last data filed at 08/14/15 1715  Gross per 24 hour  Intake    450 ml  Output    750 ml  Net   -300 ml   Filed Weights   08/11/15 0416 08/12/15 0500 08/14/15 0400  Weight: 55.6 kg (122 lb 9.2 oz) 52.4 kg (115 lb 8.3 oz) 47.6 kg (104 lb 15 oz)    Exam:   General:  Afebrile; denies CP and SOB. Patient feeling weak and deconditioned. Also hoarse from intubation. Oriented to person, place and situation.  Cardiovascular: rate controlled, no rubs or gallops. Good healing mid chest incision.  Respiratory: CTA bilaterally  Abdomen: soft, NT, ND; positive BS  Musculoskeletal: no edema, no cyanosis   Data Reviewed: Basic Metabolic Panel:  Recent Labs Lab 08/10/15 0420 08/11/15 0400 08/12/15 0510 08/13/15 0530 08/13/15 2138 08/14/15 0520  NA 132* 133* 134* 134* 133* 134*  K 3.5 3.0* 3.4* 3.0* 3.6 3.3*  CL 100* 92* 92* 86* 88* 88*  CO2 23 25 26  32 34* 34*  GLUCOSE 121* 105* 102* 104* 132* 110*  BUN 12 10 17  29* 27* 30*  CREATININE 0.60 0.73 0.81 0.73 0.63 0.65  CALCIUM 8.1* 8.1* 8.7* 8.8* 8.8* 8.9  MG 2.0 1.9 2.3 2.6*  --  2.5*  PHOS 3.2 3.1 4.2 4.2  --  3.0   Liver Function Tests:  Recent Labs Lab 08/10/15 0420 08/11/15 0400 08/11/15 1543  08/12/15 0510 08/13/15 0530 08/14/15 0520  AST 202* 149*  --  99* 68* 50*  ALT 364* 327*  --  243* 181* 131*  ALKPHOS 101 106  --  106 97 92  BILITOT 1.7* 1.8*  --  1.7* 1.4* 1.1  PROT 5.2* 6.5 6.1* 6.4* 6.9 6.5  ALBUMIN 2.2* 2.7*  --  2.7* 2.9* 3.0*   CBC:  Recent Labs Lab 08/08/15 0410  08/10/15 0420 08/11/15 0400 08/12/15 0510 08/13/15 0530 08/14/15 0520  WBC 12.8*  < > 6.9 7.2 6.2 7.2 10.9*  NEUTROABS 10.7*  --   --   --   --   --   --   HGB 10.4*  < > 9.5* 10.2* 10.1* 10.5* 10.3*  HCT 31.0*  < > 27.8* 29.5* 30.8* 31.6* 31.9*  MCV 94.2  < > 93.3 91.9 92.2 93.2 93.8  PLT 319  < > 256 277 290 303 298  < > = values in this interval not displayed. Cardiac Enzymes: No results for input(s): CKTOTAL, CKMB, CKMBINDEX, TROPONINI in the last 168 hours. BNP (last 3 results)  Recent Labs  11/21/14 2324  BNP 196.6*   CBG:  Recent Labs Lab 08/12/15 1625 08/12/15 2018 08/13/15 0043 08/13/15 0533 08/13/15 0848  GLUCAP 91 113* 101* 98 108*    Recent Results (from the past 240 hour(s))  MRSA PCR Screening     Status: None   Collection Time: 08/05/15 10:35 PM  Result Value Ref Range Status   MRSA by PCR NEGATIVE NEGATIVE Final    Comment:        The GeneXpert MRSA Assay (FDA approved for NASAL specimens only), is one component of a comprehensive MRSA colonization surveillance program. It is not intended to diagnose MRSA infection nor to guide or monitor treatment for MRSA infections.   Culture, respiratory (NON-Expectorated)     Status: None   Collection Time: 08/06/15  8:13 AM  Result Value Ref Range Status   Specimen Description TRACHEAL ASPIRATE  Final   Special Requests NONE  Final   Gram Stain   Final    MODERATE WBC PRESENT,BOTH PMN AND MONONUCLEAR NO SQUAMOUS EPITHELIAL CELLS SEEN MODERATE GRAM POSITIVE COCCI IN PAIRS IN CHAINS Performed at [facility]    Culture   Final    NORMAL OROPHARYNGEAL FLORA Performed at [facility]     Report Status 08/08/2015 FINAL  Final  Urine culture     Status: None   Collection Time: 08/06/15  8:44 AM  Result Value Ref Range Status   Specimen Description URINE, CATHETERIZED  Final   Special Requests NONE  Final   Culture NO GROWTH 1 DAY  Final   Report Status 08/07/2015 FINAL  Final  Culture, blood (routine x 2)     Status: None   Collection Time: 08/06/15  8:55 AM  Result Value Ref Range Status   Specimen Description BLOOD RIGHT HAND  Final   Special Requests IN PEDIATRIC BOTTLE 2CC  Final   Culture NO GROWTH 5 DAYS  Final   Report  Status 08/11/2015 FINAL  Final  Culture, blood (routine x 2)     Status: None   Collection Time: 08/06/15  9:02 AM  Result Value Ref Range Status   Specimen Description BLOOD RIGHT HAND  Final   Special Requests BOTTLES DRAWN AEROBIC ONLY 3CC  Final   Culture NO GROWTH 5 DAYS  Final   Report Status 08/11/2015 FINAL  Final  Body fluid culture     Status: None   Collection Time: 08/11/15  1:08 PM  Result Value Ref Range Status   Specimen Description FLUID LEFT PLEURAL  Final   Special Requests Normal  Final   Gram Stain   Final    FEW WBC PRESENT,BOTH PMN AND MONONUCLEAR NO ORGANISMS SEEN    Culture NO GROWTH 3 DAYS  Final   Report Status 08/14/2015 FINAL  Final  Fungus Culture with Smear     Status: None (Preliminary result)   Collection Time: 08/11/15  1:08 PM  Result Value Ref Range Status   Specimen Description FLUID PLEURAL LEFT  Final   Special Requests NONE  Final   Fungal Smear   Final    NO YEAST OR FUNGAL ELEMENTS SEEN Performed at [facility]    Culture   Final    CULTURE IN PROGRESS FOR FOUR WEEKS Performed at [facility]    Report Status PENDING  Incomplete     Studies: Dg Chest Port 1 View  08/13/2015  CLINICAL DATA:  Shortness of breath.  Pleural effusion. EXAM: PORTABLE CHEST 1 VIEW COMPARISON:  08/12/2015. FINDINGS: Interim extubation removal of NG tube. Left IJ line in stable position. Prior  CABG and cardiac valve replacement. Left atrial appendage clip noted. Stable mild cardiomegaly. No pulmonary venous congestion. Low lung volumes with bibasilar atelectasis and/or bilateral infiltrates/edema. Small bilateral pleural effusions. IMPRESSION: 1. Interim extubation removal of NG tube. Left IJ line in stable position. 2. Prior CABG and cardiac valve replacement. Left atrial appendage clip noted. Stable cardiomegaly. No pulmonary venous congestion. 3. Low lung volumes with persistent bibasilar atelectasis and/or pulmonary infiltrates/edema. Persistent bilateral pleural effusions. No interim change. Electronically Signed   By: Maisie Fus  Register   On: 08/13/2015 07:06    Scheduled Meds: . antiseptic oral rinse  7 mL Mouth Rinse BID  . [START ON 08/15/2015]  ceFAZolin (ANCEF) IV  2 g Intravenous To Cath  . chlorhexidine  60 mL Topical Once  . [START ON 08/15/2015] chlorhexidine  60 mL Topical Once  . feeding supplement  1 Container Oral BID BM  . furosemide  40 mg Intravenous Daily  . [START ON 08/15/2015] gentamicin irrigation  80 mg Irrigation To Cath  . [START ON 08/15/2015] methylPREDNISolone (SOLU-MEDROL) injection  40 mg Intravenous Q24H   Continuous Infusions: . 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 If 7PM-7AM, please contact night-coverage at [email or web address], password [facility] 08/14/2015, 8:11 PM  LOS: 9 days

## 2015-08-15 ENCOUNTER — Encounter (HOSPITAL_COMMUNITY)
Admission: EM | Disposition: A | Discharge status: Skilled Nursing Facility | Payer: Self-pay | Source: Home / Self Care | Attending: Internal Medicine

## 2015-08-15 ENCOUNTER — Encounter (HOSPITAL_COMMUNITY): Payer: Self-pay | Admitting: *Deleted

## 2015-08-15 ENCOUNTER — Inpatient Hospital Stay (HOSPITAL_COMMUNITY): Payer: BLUE CROSS/BLUE SHIELD

## 2015-08-15 ENCOUNTER — Ambulatory Visit: Payer: BLUE CROSS/BLUE SHIELD | Admitting: Cardiovascular Disease

## 2015-08-15 DIAGNOSIS — I472 Ventricular tachycardia: Secondary | ICD-10-CM | POA: Insufficient documentation

## 2015-08-15 DIAGNOSIS — I4891 Unspecified atrial fibrillation: Secondary | ICD-10-CM | POA: Insufficient documentation

## 2015-08-15 DIAGNOSIS — I4729 Other ventricular tachycardia: Secondary | ICD-10-CM | POA: Insufficient documentation

## 2015-08-15 DIAGNOSIS — I428 Other cardiomyopathies: Secondary | ICD-10-CM

## 2015-08-15 HISTORY — PX: EP IMPLANTABLE DEVICE: SHX172B

## 2015-08-15 LAB — BASIC METABOLIC PANEL
Anion gap: 10 (ref 5–15)
BUN: 5 mg/dL — AB (ref 6–20)
CALCIUM: 9.7 mg/dL (ref 8.9–10.3)
CO2: 23 mmol/L (ref 22–32)
Chloride: 106 mmol/L (ref 101–111)
Creatinine, Ser: 0.75 mg/dL (ref 0.44–1.00)
GFR calc Af Amer: >60 mL/min (ref >60)
GLUCOSE: 131 mg/dL — AB (ref 65–99)
Potassium: 4.2 mmol/L (ref 3.5–5.1)
Sodium: 139 mmol/L (ref 135–145)

## 2015-08-15 LAB — CBC
HCT: 42 % (ref 36.0–46.0)
Hemoglobin: 13.4 g/dL (ref 12.0–15.0)
MCH: 30.5 pg (ref 26.0–34.0)
MCHC: 31.9 g/dL (ref 30.0–36.0)
MCV: 95.7 fL (ref 78.0–100.0)
PLATELETS: 216 10*3/uL (ref 150–400)
RBC: 4.39 MIL/uL (ref 3.87–5.11)
RDW: 14.2 % (ref 11.5–15.5)
WBC: 12.2 10*3/uL — ABNORMAL HIGH (ref 4.0–10.5)

## 2015-08-15 SURGERY — ICD IMPLANT
Anesthesia: LOCAL

## 2015-08-15 MED ORDER — FENTANYL CITRATE (PF) 100 MCG/2ML IJ SOLN
INTRAMUSCULAR | Status: AC
  Filled 2015-08-15: qty 2

## 2015-08-15 MED ORDER — FENTANYL CITRATE (PF) 100 MCG/2ML IJ SOLN
INTRAMUSCULAR | Status: DC | PRN
  Administered 2015-08-15: 25 ug via INTRAVENOUS
  Administered 2015-08-15: 12.5 ug via INTRAVENOUS

## 2015-08-15 MED ORDER — LIDOCAINE HCL (PF) 1 % IJ SOLN
INTRAMUSCULAR | Status: DC | PRN
  Administered 2015-08-15: 31 mL via INTRADERMAL

## 2015-08-15 MED ORDER — SODIUM CHLORIDE 0.9 % IR SOLN
Status: AC
  Filled 2015-08-15: qty 2

## 2015-08-15 MED ORDER — HEPARIN (PORCINE) IN NACL 2-0.9 UNIT/ML-% IJ SOLN
INTRAMUSCULAR | Status: DC | PRN
  Administered 2015-08-15: 17:00:00

## 2015-08-15 MED ORDER — SODIUM CHLORIDE 0.9 % IV SOLN
INTRAVENOUS | Status: DC | PRN
  Administered 2015-08-15: 150 mL via INTRAVENOUS

## 2015-08-15 MED ORDER — MIDAZOLAM HCL 5 MG/5ML IJ SOLN
INTRAMUSCULAR | Status: AC
  Filled 2015-08-15: qty 5

## 2015-08-15 MED ORDER — CEFAZOLIN SODIUM 1-5 GM-% IV SOLN
1.0000 g | Freq: Four times a day (QID) | INTRAVENOUS | Status: AC
  Administered 2015-08-15 – 2015-08-16 (×3): 1 g via INTRAVENOUS
  Filled 2015-08-15 (×3): qty 50

## 2015-08-15 MED ORDER — CEFAZOLIN SODIUM-DEXTROSE 2-3 GM-% IV SOLR
INTRAVENOUS | Status: AC
  Filled 2015-08-15: qty 50

## 2015-08-15 MED ORDER — LIDOCAINE HCL (PF) 1 % IJ SOLN
INTRAMUSCULAR | Status: AC
  Filled 2015-08-15: qty 60

## 2015-08-15 MED ORDER — HEPARIN (PORCINE) IN NACL 2-0.9 UNIT/ML-% IJ SOLN
INTRAMUSCULAR | Status: AC
  Filled 2015-08-15: qty 500

## 2015-08-15 MED ORDER — TRAMADOL HCL 50 MG PO TABS
50.0000 mg | ORAL_TABLET | Freq: Once | ORAL | Status: AC
  Administered 2015-08-15: 50 mg via ORAL
  Filled 2015-08-15: qty 1

## 2015-08-15 MED ORDER — ACETAMINOPHEN 325 MG PO TABS
325.0000 mg | ORAL_TABLET | ORAL | Status: DC | PRN
Start: 2015-08-15 — End: 2015-08-29
  Administered 2015-08-15: 650 mg via ORAL
  Filled 2015-08-15: qty 2

## 2015-08-15 MED ORDER — FUROSEMIDE 20 MG PO TABS
20.0000 mg | ORAL_TABLET | Freq: Every day | ORAL | Status: DC
  Administered 2015-08-16: 20 mg via ORAL
  Filled 2015-08-15: qty 1

## 2015-08-15 MED ORDER — MIDAZOLAM HCL 5 MG/5ML IJ SOLN
INTRAMUSCULAR | Status: DC | PRN
  Administered 2015-08-15 (×3): 1 mg via INTRAVENOUS

## 2015-08-15 MED ORDER — ONDANSETRON HCL 4 MG/2ML IJ SOLN
4.0000 mg | Freq: Four times a day (QID) | INTRAMUSCULAR | Status: DC | PRN
  Administered 2015-08-16 – 2015-08-26 (×2): 4 mg via INTRAVENOUS
  Filled 2015-08-15: qty 2

## 2015-08-15 SURGICAL SUPPLY — 8 items
CABLE SURGICAL S-101-97-12 (CABLE) ×2 IMPLANT
ICD EVERA DR XT MRI DDMB1D4 (ICD Generator) ×2 IMPLANT
LEAD CAPSURE NOVUS 45CM (Lead) ×2 IMPLANT
LEAD SPRINT QUAT SEC 6935M-55 (Lead) ×2 IMPLANT
PAD DEFIB LIFELINK (PAD) ×2 IMPLANT
SHEATH CLASSIC 7F (SHEATH) ×2 IMPLANT
SHEATH CLASSIC 9F (SHEATH) ×2 IMPLANT
TRAY PACEMAKER INSERTION (PACKS) ×2 IMPLANT

## 2015-08-15 NOTE — Clinical Social Work Note (Signed)
Clinical Social Work Assessment  Patient Details  Name: Diane [name] MRN: [id number] Date of Birth: 1961-04-26  Date of referral:  08/15/15               Reason for consult:  Discharge Planning, Facility Placement                Permission sought to share information with:  Facility Sport and exercise psychologist, Family Supports Permission granted to share information::  Yes, Verbal Permission Granted  Name::     Richard and Electronics engineer::  SNFs  Relationship::     Contact Information:     Housing/Transportation Living arrangements for the past 2 months:  [place] of Information:  Patient, Spouse Patient Interpreter Needed:  None Criminal Activity/Legal Involvement Pertinent to Current Situation/Hospitalization:  No - Comment as needed Significant Relationships:  Adult Children, Spouse Lives with:  Spouse Do you feel safe going back to the place where you live?  Yes Need for family participation in patient care:  Yes (Comment)  Care giving concerns:  Patient and patient's husband are concerned that the patient may not be able to return home at time of discharge. Both agree that continued rehab may be needed if patient is still as weak and she is.   Social Worker assessment / plan:  CSW met with patient and patient's husband at bedside to complete assessment. CSW notes that the patient has a controlled fall earlier today in her room. The patient and husband at bedside share that the patient was discharged home from her last hospitalization and the patient did experience some falls and syncope while at home. The patient and husband are both agreeable to looking at SNF options available to patient in the event they decide that this is the best option for her at discharge. CSW explained SNF search/placement process and answered the patient's questions. CSW explained that the patient will likely need to go to a facility that will accept a LOG pending BCBS authorization as the  patient will likely be ready for discharge prior to obtaining SNF authorization. The patient and the husband are both agreeable to this and understanding that placement may need to be outside of [facility]. SNF list provided to patient and family. CSW will follow up with bed offers once available.  Employment status:    Forensic scientist:  Managed Care PT Recommendations:  Home with [place], 24 Hour Supervision Information / Referral to community resources:  [place]  Patient/Family's Response to care:  Patient and family both appear happy with the care the patient is receiving.  Patient/Family's Understanding of and Emotional Response to Diagnosis, Current Treatment, and Prognosis:  Patient and family appear to have a good understanding of the reason for the patient's admission and the plan for the patient's progression of care. Both understand that patient's post DC needs.   Emotional Assessment Appearance:  Appears stated age Attitude/Demeanor/Rapport:  Other (Patient is welcoming and appropriate.) Affect (typically observed):  Accepting, Calm, Appropriate, Pleasant Orientation:  Oriented to Self, Oriented to Place, Oriented to  Time, Oriented to Situation Alcohol / Substance use:  Never Used Psych involvement (Current and /or in the community):  No (Comment)  Discharge Needs  Concerns to be addressed:  Discharge Planning Concerns Readmission within the last 30 days:  Yes Current discharge risk:  Chronically ill, Physical Impairment Barriers to Discharge:  Continued Medical Work up   [employer] MSW, [place], [place], [phone]

## 2015-08-15 NOTE — Progress Notes (Signed)
ICD Criteria  Current LVEF:45%. Within 12 months prior to implant: Yes   Heart failure history: Yes, Class I  Cardiomyopathy history: Yes, Non-Ischemic Cardiomyopathy.  Atrial Fibrillation/Atrial Flutter: Yes, Paroxysmal.  Ventricular tachycardia history: Yes, Hemodynamic instability present. VT Type is unknown.  Cardiac arrest history: Yes, Bradycardia.  History of syndromes with risk of sudden death: No.  Previous ICD: No.  Current ICD indication: Secondary  PPM indication: Asystole   Class I or II Bradycardia indication present: No  Beta Blocker therapy for 3 or more months: No, medical reason.  Ace Inhibitor/ARB therapy for 3 or more months: No, medical reason.

## 2015-08-15 NOTE — Progress Notes (Signed)
Patient c/o tylenol not completely effective with surgical pain from the ICD placement today. States tylenol does not work for her and Kingsley Callander is what she takes at home. Does not want anything strong such as a narcotic. Lenny Pastel NP paged for patient's request of ibuprophen. [facility] [employer]

## 2015-08-15 NOTE — Clinical Social Work Placement (Signed)
   CLINICAL SOCIAL WORK PLACEMENT  NOTE  Date:  08/15/2015  Patient Details  Name: Diane [name] MRN: [id number] Date of Birth: 17-Jan-1961  Clinical Social Work is seeking post-discharge placement for this patient at the Skilled  Nursing Facility level of care (*CSW will initial, date and re-position this form in  chart as items are completed):  Yes   Patient/family provided with [place] Clinical Social Work Department's list of facilities offering this level of care within the geographic area requested by the patient (or if unable, by the patient's family).  Yes   Patient/family informed of their freedom to choose among providers that offer the needed level of care, that participate in Medicare, Medicaid or managed care program needed by the patient, have an available bed and are willing to accept the patient.  Yes   Patient/family informed of [facility]'s ownership interest in [facility] and [facility], as well as of the fact that they are under no obligation to receive care at these facilities.  PASRR submitted to EDS on 08/15/15     PASRR number received on 08/15/15     Existing PASRR number confirmed on       FL2 transmitted to all facilities in geographic area requested by pt/family on       FL2 transmitted to all facilities within larger geographic area on       Patient informed that his/her managed care company has contracts with or will negotiate with certain facilities, including the following:            Patient/family informed of bed offers received.  Patient chooses bed at       Physician recommends and patient chooses bed at      Patient to be transferred to   on  .  Patient to be transferred to facility by       Patient family notified on   of transfer.  Name of family member notified:        PHYSICIAN Please prepare priority discharge summary, including medications, Please prepare prescriptions, Please sign FL2     Additional Comment:     _______________________________________________ Roddie Mc MSW, LCSW, [place], [phone]

## 2015-08-15 NOTE — NC FL2 (Signed)
[place] MEDICAID FL2 LEVEL OF CARE SCREENING TOOL     IDENTIFICATION  Patient Name: Diane [name] [place] Birthdate: 04/06/1961 Sex: female Admission Date (Current Location): 08/05/2015  [facility] and [place] Number:  Producer, television/film/video and Address:  [facility], [address], [place], [place] [address]      Provider Number: [id number]  Attending Physician Name and Address:  Vassie Loll, MD  Relative Name and Phone Number:       Current Level of Care: Hospital Recommended Level of Care: Skilled Nursing Facility Prior Approval Number:    Date Approved/Denied:   PASRR Number: [id number]  Discharge Plan: SNF    Current Diagnoses: Patient Active Problem List   Diagnosis Date Noted  . Atrial fibrillation (HCC)   . Paroxysmal ventricular tachycardia (HCC)   . Asystole (HCC) 08/13/2015  . Hypokalemia 08/13/2015  . Long QT interval 08/13/2015  . Valvular cardiomyopathy (HCC) 08/13/2015  . Central line insertion site infection   . Congestive heart failure (HCC)   . Cardiogenic shock (HCC) 08/06/2015  . Cardiac arrest (HCC) 08/05/2015  . Respiratory arrest (HCC) 08/05/2015  . Long term (current) use of anticoagulants [Z79.01] 07/31/2015  . Acute on chronic combined systolic (congestive) and diastolic (congestive) heart failure (HCC) 07/31/2015  . 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 Preoperative testing 07/07/2015  . Chronic combined systolic and diastolic CHF (congestive heart failure) (HCC) 07/03/2015  . Shortness of breath 05/29/2015  . Facial spasm 01/01/2015  . Faintness 01/01/2015    Orientation RESPIRATION BLADDER Height & Weight    Self, Time, Situation, Place  Normal Continent 5\' 2"  (157.5 cm) 103 lbs.  BEHAVIORAL SYMPTOMS/MOOD NEUROLOGICAL BOWEL NUTRITION STATUS   (NONE)  (NONE) Continent Diet (Currently NPO)  AMBULATORY STATUS COMMUNICATION OF NEEDS Skin   Extensive Assist Verbally Surgical  wounds (Incision chest)                       Personal Care Assistance Level of Assistance  Bathing, Dressing Bathing Assistance: Limited assistance   Dressing Assistance: Limited assistance     Functional Limitations Info  Sight, Hearing, Speech Sight Info: Adequate Hearing Info: Adequate Speech Info: Adequate    SPECIAL CARE FACTORS FREQUENCY  PT (By licensed PT), OT (By licensed OT)     PT Frequency: 5/week OT Frequency: 5/week            Contractures Contractures Info: Present    Additional Factors Info  Code Status, Allergies Code Status Info: Full Code Allergies Info: Vicodin, Adhesive, Silicone           Current Medications (08/15/2015):  This is the current hospital active medication list Current Facility-Administered Medications  Medication Dose Route Frequency Provider Last Rate Last Dose  . 0.9 %  sodium chloride infusion   Intravenous Continuous Marily Lente, NP 50 mL/hr at 08/15/15 0615    . [MAR Hold] antiseptic oral rinse (CPC / CETYLPYRIDINIUM CHLORIDE 0.05%) solution 7 mL  7 mL Mouth Rinse BID Kalman Shan, MD   7 mL at 08/15/15 1000  . ceFAZolin (ANCEF) IVPB 2 g/50 mL premix  2 g Intravenous To Cath Marily Lente, NP   2 g at 08/15/15 1617  . [MAR Hold] feeding supplement (BOOST / RESOURCE BREEZE) liquid 1 Container  1 Container Oral BID BM Arlyss Gandy, RD   1 Container at 08/14/15 1400  . [MAR Hold] furosemide (LASIX) tablet 20 mg  20 mg Oral Daily Vassie Loll, MD      . gentamicin (GARAMYCIN) 80 mg in sodium chloride irrigation 0.9 % 500 mL irrigation  80 mg Irrigation To Cath Amber Caryl Bis, NP      . Mitzi Hansen Hold] ondansetron [facility]) injection 4-8 mg  4-8 mg Intravenous Q6H PRN Oretha Milch, MD   4 mg at 08/10/15 1832  . [MAR Hold] pantoprazole (PROTONIX) EC tablet 40 mg  40 mg Oral Q1200 Vassie Loll, MD   40 mg at 08/15/15 1200  . [MAR Hold] polyvinyl alcohol (LIQUIFILM TEARS) 1.4 % ophthalmic solution 1 drop  1 drop Both Eyes  PRN Kalman Shan, MD      . Mitzi Hansen Hold] white petrolatum (VASELINE) gel   Topical PRN Kalman Shan, MD         Discharge Medications: Please see discharge summary for a list of discharge medications.  Relevant Imaging Results:  Relevant Lab Results:   Additional Information SSN: [id number]  Venita Lick, LCSW

## 2015-08-15 NOTE — Progress Notes (Signed)
[facility] PROGRESS NOTE  Diane [name] WUJ:[id number] DOB: 05-07-1961 DOA: 08/05/2015 PCP: Emeterio Reeve, MD  Assessment/Plan: 1-VDRF in setting of PEA: -extubated on 1/24 -no SOB and good O2 sat on RA -continue flutter valve/IS -overall stable. 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: -improved/resolving -will continue titration down of lasix -good O2 sat on RA -cardiology on board; will follow rec's  4-atrial fibrillation: S/P LAA clipping 07/22/15  -rate controlled at this time -will follow cardiology rec's -on heparin and with plans to start coumadin -CHADsVASC score 0-1 -will not need anticoagulation long term  5-hypokalemia -will replete as needed -BMET intermittently to follow electrolytes  6-transaminitis: in the setting of PEA -will monitor trend -continue trending down -no icterus  7-normocitic anemia: will monitor trend -no signs of overt bleeding currently -Hgb 10.3 and stable  8-GI prophylaxis: continue PPI   Code Status: Full Family Communication: no family at bedside  Disposition Plan: remains inpatient; plan is for dual chamber ICD on 1/27. Will follow cardiology rec's. Continue working with PT.   Consultants:  PCCM  Cardiology service   Procedures and significant events: 1/17 Admitted for PEA/VT arrest, intubated, placed on hypothermic protocol  1/17 CTA chest with no PO, moderate bilateral pleural effusions and consolidative changes of lower lobes 1/17 Echo with normal EF, mild LVH, mild LAW, trace AI, mild MS, no MR, small pericardial effusion, left pleural effusion 1/18 EEG with generalized slowing  1/19 Rewarmed, paralytics, sedation turned off 1/19 CT head normal 1/20 CXR with  left sided pleural effusion and b/l pulmonary edema  1/20 INR 9.4 requiring FFP and vitamin K 1/20 IV amio stopped  1/20 EEG with generalized slowing  1/21 Improved mental status, start IV heparin  1/22 Vomited tube feeds, KUB normal  1/23 Thoracentesis of large left effusion with 950 cc semi-hemorrhagic fluid. IV heparin stopped. 1/23 2D-echo with EF 55-60% MV with no significant regurgitation  1/24- extubated  Antibiotics:  Unasyn 1/18>>>1/24   HPI/Subjective: Afebrile, no CP, no SOB. Hoarse from intubation. No acute distress.  Objective: Filed Vitals:   08/15/15 0450 08/15/15 1120  BP: 117/57 113/52  Pulse: 93 94  Temp: 97.8 F (36.6 C) 98.4 F (36.9 C)  Resp: 18 16    Intake/Output Summary (Last 24 hours) at 08/15/15 1545 Last data filed at 08/15/15 1407  Gross per 24 hour  Intake    120 ml  Output   1350 ml  Net  -1230 ml   Filed Weights   08/12/15 0500 08/14/15 0400 08/15/15 0450  Weight: 52.4 kg (115 lb 8.3 oz) 47.6 kg (104 lb 15 oz) 47.129 kg (103 lb 14.4 oz)    Exam:   General:  Afebrile; denies CP and SOB. Patient feeling weak and deconditioned. Also hoarse from intubation. Oriented to person, place and situation. No overnight events.  Cardiovascular: rate controlled, no rubs or gallops. Good healing mid chest incision.  Respiratory: CTA bilaterally  Abdomen: soft, NT, ND; positive BS  Musculoskeletal: no edema, no cyanosis   Data Reviewed: Basic Metabolic Panel:  Recent Labs Lab 08/10/15 0420 08/11/15 0400 08/12/15 0510 08/13/15 0530 08/13/15 2138 08/14/15 0520 08/15/15 0515  NA 132* 133* 134* 134* 133* 134* 139  K 3.5 3.0* 3.4* 3.0* 3.6 3.3* 4.2  CL 100* 92* 92*  86* 88* 88* 106  CO2 32 34* 34* 23  GLUCOSE 121* 105* 102* 104* 132* 110* 131*  BUN 29* 27* 30* 5*  CREATININE 0.60 0.73 0.81 0.73 0.63 0.65 0.75  CALCIUM 8.1* 8.1* 8.7* 8.8* 8.8* 8.9 9.7  MG 2.0 1.9 2.3 2.6*  --  2.5*  --   PHOS 3.2 3.1 4.2 4.2  --   3.0  --    Liver Function Tests:  Recent Labs Lab 08/10/15 0420 08/11/15 0400 08/11/15 1543 08/12/15 0510 08/13/15 0530 08/14/15 0520  AST 202* 149*  --  99* 68* 50*  ALT 364* 327*  --  243* 181* 131*  ALKPHOS 101 106  --  106 97 92  BILITOT 1.7* 1.8*  --  1.7* 1.4* 1.1  PROT 5.2* 6.5 6.1* 6.4* 6.9 6.5  ALBUMIN 2.2* 2.7*  --  2.7* 2.9* 3.0*   CBC:  Recent Labs Lab 08/11/15 0400 08/12/15 0510 08/13/15 0530 08/14/15 0520 08/15/15 0515  WBC 7.2 6.2 7.2 10.9* 12.2*  HGB 10.2* 10.1* 10.5* 10.3* 13.4  HCT 29.5* 30.8* 31.6* 31.9* 42.0  MCV 91.9 92.2 93.2 93.8 95.7  PLT 277 290 303 298 216   BNP (last 3 results)  Recent Labs  11/21/14 2324  BNP 196.6*   CBG:  Recent Labs Lab 08/12/15 1625 08/12/15 2018 08/13/15 0043 08/13/15 0533 08/13/15 0848  GLUCAP 91 113* 101* 98 108*    Recent Results (from the past 240 hour(s))  MRSA PCR Screening     Status: None   Collection Time: 08/05/15 10:35 PM  Result Value Ref Range Status   MRSA by PCR NEGATIVE NEGATIVE Final    Comment:        The GeneXpert MRSA Assay (FDA approved for NASAL specimens only), is one component of a comprehensive MRSA colonization surveillance program. It is not intended to diagnose MRSA infection nor to guide or monitor treatment for MRSA infections.   Culture, respiratory (NON-Expectorated)     Status: None   Collection Time: 08/06/15  8:13 AM  Result Value Ref Range Status   Specimen Description TRACHEAL ASPIRATE  Final   Special Requests NONE  Final   Gram Stain   Final    MODERATE WBC PRESENT,BOTH PMN AND MONONUCLEAR NO SQUAMOUS EPITHELIAL CELLS SEEN MODERATE GRAM POSITIVE COCCI IN PAIRS IN CHAINS Performed at [facility]    Culture   Final    NORMAL OROPHARYNGEAL FLORA Performed at [facility]    Report Status 08/08/2015 FINAL  Final  Urine culture     Status: None   Collection Time: 08/06/15  8:44 AM  Result Value Ref Range Status   Specimen  Description URINE, CATHETERIZED  Final   Special Requests NONE  Final   Culture NO GROWTH 1 DAY  Final   Report Status 08/07/2015 FINAL  Final  Culture, blood (routine x 2)     Status: None   Collection Time: 08/06/15  8:55 AM  Result Value Ref Range Status   Specimen Description BLOOD RIGHT HAND  Final   Special Requests IN PEDIATRIC BOTTLE 2CC  Final   Culture NO GROWTH 5 DAYS  Final   Report Status 08/11/2015 FINAL  Final  Culture, blood (routine x 2)     Status: None   Collection Time: 08/06/15  9:02 AM  Result Value Ref Range Status   Specimen Description BLOOD RIGHT HAND  Final   Special Requests BOTTLES DRAWN AEROBIC ONLY 3CC  Final  Culture NO GROWTH 5 DAYS  Final   Report Status 08/11/2015 FINAL  Final  Body fluid culture     Status: None   Collection Time: 08/11/15  1:08 PM  Result Value Ref Range Status   Specimen Description FLUID LEFT PLEURAL  Final   Special Requests Normal  Final   Gram Stain   Final    FEW WBC PRESENT,BOTH PMN AND MONONUCLEAR NO ORGANISMS SEEN    Culture NO GROWTH 3 DAYS  Final   Report Status 08/14/2015 FINAL  Final  Fungus Culture with Smear     Status: None (Preliminary result)   Collection Time: 08/11/15  1:08 PM  Result Value Ref Range Status   Specimen Description FLUID PLEURAL LEFT  Final   Special Requests NONE  Final   Fungal Smear   Final    NO YEAST OR FUNGAL ELEMENTS SEEN Performed at [facility]    Culture   Final    CULTURE IN PROGRESS FOR FOUR WEEKS Performed at [facility]    Report Status PENDING  Incomplete     Studies: Dg Chest 2 View  08/15/2015  CLINICAL DATA:  Follow-up pleural effusion EXAM: CHEST  2 VIEW COMPARISON:  08/13/2015 FINDINGS: Right pleural effusion improved. Small left pleural effusion is stable. No pneumothorax. Postoperative changes are stable. Left jugular venous catheter removed. Hyperaeration. Minimal atelectasis at the left base. IMPRESSION: Improved right pleural effusion.  Stable left pleural effusion. No pneumothorax. Electronically Signed   By: Jolaine Click M.D.   On: 08/15/2015 10:36   Mr Card Morphology Wo/w Cm  08/14/2015  CLINICAL DATA:  Cardiac Arrest, Long QT, Cardiomyopathy EXAM: CARDIAC MRI TECHNIQUE: The patient was scanned on a 1.5 Tesla GE magnet. A dedicated cardiac coil was used. Functional imaging was done using Fiesta sequences. 2,3, and 4 chamber views were done to assess for RWMA's. Modified Simpson's rule using a short axis stack was used to calculate an ejection fraction on a dedicated work [employer]. The patient received 20 cc of Multihance. After 10 minutes inversion recovery sequences were used to assess for infiltration and scar tissue. CONTRAST:  20 cc Multihance FINDINGS: There was mild LAE. The RA and RV were normal. The LV was mildly enlarged. There was mildly asymmetric hypertrophy of the septum Septum measures 15 mm and posterior wall 10 mm. There is no SAM. The patient is s/p mitral repair with an annuloplasty ring. There is no significant residual MR with mild diastolic bowing. The LAA has been clipped and has no flow. The aortic valve is normal as is the aortic root There is no residual PDA identified. There is a small pericardial effusion and large left pleural effusion. The distal septum and apex appear hypokinetic. The quantitative EF is 45% (EDV 70 cc, ESV 39 cc SV 31 cc) There is a markedly abnormal gadolinium uptake in the LV myocardium on inversion recovery sequences. This uptake is diffuse in the endocardial and mid epicardial areas. Although uptake is diffuse it is especially marked in the inferior wall. IMPRESSION: 1) Mildly asymmetric septal hypertrophy 15 mm. Distal septal apical hypokinesis EF 45% 2) Diffuse delayed gadolinium uptake in the subendocardial and mid epicardial areas especially marked in the inferior wall 3) S/P mitral valve repair with no residual MR and annuloplasty ring 4) Atri-Clip of the LAA with  no flow 5) No residual PDA flow 6) Mild LAE 7) Small pericardial effusion 8) Large left pleural effusion Note delayed gadolinium findings may be  consistent with myocarditis vs infiltrative cardiomyopathy Charlton Haws Electronically Signed   By: Charlton Haws M.D.   On: 08/14/2015 20:35    Scheduled Meds: . antiseptic oral rinse  7 mL Mouth Rinse BID  .  ceFAZolin (ANCEF) IV  2 g Intravenous To Cath  . feeding supplement  1 Container Oral BID BM  . furosemide  40 mg Intravenous Daily  . gentamicin irrigation  80 mg Irrigation To Cath  . pantoprazole  40 mg Oral Q1200   Continuous Infusions: . 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 If 7PM-7AM, please contact night-coverage at [email or web address], password [facility] 08/15/2015, 3:45 PM  LOS: 10 days

## 2015-08-15 NOTE — Plan of Care (Signed)
Problem: Consults Goal: ICD Patient Education (See Patient Education module for education specifics.) Outcome: Progressing Patient continues to experience low blood pressures. Other than feeling "tired," she is asymptomatic at rest. Out-of-bed, however, she becomes lightheaded and weak (see previous note) relatively quickly.  Recent vital signs: Filed Vitals:    08/14/15 1200 08/14/15 1615 08/14/15 2002 08/14/15 2326  BP: 103/57 87/33 94/49  87/42  Pulse: 91 85 92 85  Temp: 98.3 F (36.8 C) 98.2 F (36.8 C) 98.6 F (37 C) 97.6 F (36.4 C)  TempSrc: Oral Oral Oral Oral  Resp: 16 18 17 17   Height:          Weight:          SpO2: 97% 95% 94% 98%     She is very eager to participate in therapies and to mobilize. Would consider a high-risk for falls with injury related to activity intolerance and low BP. Bed alarm, yellow arm band and socks currently in use and patient/family re-educated regarding safety plan.  Patient and family also verbally educated at bedside re:  Plans of care (ICD, post-cardiac arrest with hypothermia protocol)  Prep for ICD insertion  Procedure/tests  Post-op phase of care  Activity limitations/restrictions  Medications  Pain scale/management strategies  Oral care  Increasing mobility slowly  SCDs  Initial discharge planning  Will consent for procedure in AM after final prep. MD previously [facility] of low SBP; will continue to follow acutely with this and report any new symptoms promptly. No apparent need for MD notification at this time given patient's recent history of same and lack of new issues.  Continuing to closely monitor.

## 2015-08-15 NOTE — Progress Notes (Signed)
SUBJECTIVE: The patient is doing well today.  At this time, she denies chest pain, shortness of breath, or any new concerns.  Hoarse s/p extubation.  MRI shows LGE and EF 45%.  CURRENT MEDICATIONS: . [MAR Hold] antiseptic oral rinse  7 mL Mouth Rinse BID  .  ceFAZolin (ANCEF) IV  2 g Intravenous To Cath  . [MAR Hold] feeding supplement  1 Container Oral BID BM  . [MAR Hold] furosemide  20 mg Oral Daily  . gentamicin irrigation  80 mg Irrigation To Cath  . [MAR Hold] pantoprazole  40 mg Oral Q1200   . sodium chloride 50 mL/hr at 08/15/15 0615    OBJECTIVE: Physical Exam: Filed Vitals:   08/14/15 2326 08/15/15 0450 08/15/15 1120 08/15/15 1610  BP: 87/42 117/57 113/52   Pulse: 85 93 94   Temp: 97.6 F (36.4 C) 97.8 F (36.6 C) 98.4 F (36.9 C)   TempSrc: Oral Oral Oral   Resp: 17 18 16    Height:      Weight:  103 lb 14.4 oz (47.129 kg)    SpO2: 98% 95% 99% 100%    Intake/Output Summary (Last 24 hours) at 08/15/15 1635 Last data filed at 08/15/15 1407  Gross per 24 hour  Intake    120 ml  Output   1350 ml  Net  -1230 ml    Telemetry reveals sinus rhythm  GEN- The patient is well appearing, alert and oriented x 3 today.   Head- normocephalic, atraumatic Eyes-  Sclera clear, conjunctiva pink Ears- hearing intact Oropharynx- clear Neck- supple  Lungs- Clear to ausculation bilaterally, normal work of breathing Heart- Regular rate and rhythm  GI- soft, NT, ND, + BS Extremities- no clubbing, cyanosis, or edema Skin- no rash or lesion Psych- euthymic mood, full affect Neuro- strength and sensation are intact  LABS: Basic Metabolic Panel:  Recent Labs  31/54/00 0530  08/14/15 0520 08/15/15 0515  NA 134*  < > 134* 139  K 3.0*  < > 3.3* 4.2  CL 86*  < > 88* 106  CO2 32  < > 34* 23  GLUCOSE 104*  < > 110* 131*  BUN 29*  < > 30* 5*  CREATININE 0.73  < > 0.65 0.75  CALCIUM 8.8*  < > 8.9 9.7  MG 2.6*  --  2.5*  --   PHOS 4.2  --  3.0  --   < > = values in  this interval not displayed. 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 Cardiac arrest EMS records describe both PEA as well as VT during arrest MRI with diffuse  LGE, EF 45% Plan for dual chamber ICD today Diane [name] discontinue Heparin and resume Warfarin tomorrow post procedure Discussed risks and benefits.  Risks include bleeding, infection, tamponade, pneumothroax.  2.  Paroxysmal atrial fibrillation Noted post arrest Would resume Warfarin for the short term CHADS2VASC is 1 - likely can discontinue at follow up  3.  MR s/p MVR Stable  Diane [name] M. Diane Augustine MD 08/15/2015 4:35 PM

## 2015-08-15 NOTE — Progress Notes (Addendum)
[facility] BRIEF PROGRESS NOTE   Progress and plans noted.  Will continue to follow.  Purcell Nails, MD 08/15/2015 9:34 AM

## 2015-08-15 NOTE — Progress Notes (Signed)
Pt was being assisted to the bathroom with the help of a family member.  Once in the bathroom pt stated "I started to feel week and could not stand any more".  Pt family member assisted pt to the floor and called staff to alert them that she was on the floor.  VSS and pt denise any pain, MD notified. Will continue to closely monitor.

## 2015-08-16 ENCOUNTER — Inpatient Hospital Stay (HOSPITAL_COMMUNITY): Payer: BLUE CROSS/BLUE SHIELD

## 2015-08-16 LAB — BASIC METABOLIC PANEL
ANION GAP: 6 (ref 5–15)
BUN: 17 mg/dL (ref 6–20)
CO2: 31 mmol/L (ref 22–32)
Calcium: 9 mg/dL (ref 8.9–10.3)
Chloride: 97 mmol/L — ABNORMAL LOW (ref 101–111)
Creatinine, Ser: 0.42 mg/dL — ABNORMAL LOW (ref 0.44–1.00)
GFR calc Af Amer: >60 mL/min (ref >60)
Glucose, Bld: 102 mg/dL — ABNORMAL HIGH (ref 65–99)
POTASSIUM: 3.6 mmol/L (ref 3.5–5.1)
SODIUM: 134 mmol/L — AB (ref 135–145)

## 2015-08-16 MED ORDER — TRAMADOL HCL 50 MG PO TABS
50.0000 mg | ORAL_TABLET | Freq: Three times a day (TID) | ORAL | Status: DC | PRN
  Administered 2015-08-16: 50 mg via ORAL
  Filled 2015-08-16: qty 1

## 2015-08-16 NOTE — Progress Notes (Signed)
[facility] PROGRESS NOTE  Diane [name] ZOX:[id number] DOB: 11-11-1960 DOA: 08/05/2015 PCP: Emeterio Reeve, MD  Assessment/Plan: 1-VDRF in setting of PEA: -extubated on 1/24 -no SOB and good O2 sat on RA -continue flutter valve/IS -overall stable. -S/P Cardiac MRI and dual chamber ICD implantation (given PEA and VT episode) -will need SNF for rehabilitation.  2-aspiration PNA:  -treated -patient completed antibiotic therapy with unasyn  -no SOB, no fever and normal WBC's currently  3-acute on chronic combined heart failure/pulmonary edema: -improved/resolved -will hold lasix for now; BP soft, no signs of fluid overload and orthostatic changes on exam. -good O2 sat on RA -cardiology on board; will follow rec's  4-atrial fibrillation: S/P LAA clipping 07/22/15  -rate controlled at this time -will follow cardiology rec's -on heparin and with plans to use coumadin short term -CHADsVASC score 0-1 -will might not need anticoagulation long term  5-hypokalemia -will replete as needed -BMET intermittently to follow electrolytes  6-transaminitis: in the setting of PEA -will monitor trend -continue trending down -no icterus  7-normocitic anemia: will monitor trend -no signs of overt bleeding currently -Hgb stable  8-GI prophylaxis: continue PPI   Code Status: Full Family Communication: no family at bedside  Disposition Plan: remains inpatient; S/P dual chamber ICD placement on 1/27. Will follow cardiology rec's. Continue working with PT. Will need SNF   Consultants:  PCCM  Cardiology service   Procedures and significant events: 1/17 Admitted for PEA/VT arrest, intubated, placed on hypothermic protocol  1/17 CTA chest with no PO, moderate bilateral pleural effusions and consolidative changes of lower lobes 1/17 Echo with normal EF, mild LVH, mild LAW, trace AI, mild MS, no MR, small pericardial effusion, left pleural effusion 1/18 EEG with generalized  slowing  1/19 Rewarmed, paralytics, sedation turned off 1/19 CT head normal 1/20 CXR with left sided pleural effusion and b/l pulmonary edema  1/20 INR 9.4 requiring FFP and vitamin K 1/20 IV amio stopped  1/20 EEG with generalized slowing  1/21 Improved mental status, start IV heparin  1/22 Vomited tube feeds, KUB normal  1/23 Thoracentesis of large left effusion with 950 cc semi-hemorrhagic fluid. IV heparin stopped. 1/23 2D-echo with EF 55-60% MV with no significant regurgitation  1/24- extubated 1/27 pacemaker/ICd implantation   Antibiotics:  Unasyn 1/18>>>1/24   Cefazolin prophylaxis for pacemaker/ICD placement on 1/27  HPI/Subjective: Afebrile, no CP, no SOB. Hoarse from intubation. Complaining of lightheadedness and dizzy sensation when standing. Patient is weak and deconditioned.  Objective: Filed Vitals:   08/16/15 0500 08/16/15 0843  BP: 106/66 92/52  Pulse:  84  Temp:  98.2 F (36.8 C)  Resp:  20    Intake/Output Summary (Last 24 hours) at 08/16/15 1113 Last data filed at 08/16/15 0430  Gross per 24 hour  Intake    220 ml  Output   1050 ml  Net   -830 ml   Filed Weights   08/14/15 0400 08/15/15 0450 08/16/15 0654  Weight: 47.6 kg (104 lb 15 oz) 47.129 kg (103 lb 14.4 oz) 47.582 kg (104 lb 14.4 oz)    Exam:   General:  Afebrile; denies CP and SOB. Patient feeling weak; lightheaded when standing and deconditioned. Still hoarse from intubation (but improving). Oriented to person, place and situation. S/P pacemaker/ICD placement on 1/27  Cardiovascular: rate controlled, no rubs or gallops; positive SEM. Good healing mid chest incision.  Respiratory: CTA bilaterally  Abdomen: soft, NT, ND; positive BS  Musculoskeletal: no edema, no cyanosis   Data Reviewed: Basic  Metabolic Panel:  Recent Labs Lab 08/10/15 0420 08/11/15 0400 08/12/15 0510 08/13/15 0530 08/13/15 2138 08/14/15 0520 08/15/15 0515 08/16/15 0419  NA 132* 133* 134* 134*  133* 134* 139 134*  K 3.5 3.0* 3.4* 3.0* 3.6 3.3* 4.2 3.6  CL 100* 92* 92* 86* 88* 88* 106 97*  CO2 23 25 26  32 34* 34* 23 31  GLUCOSE 121* 105* 102* 104* 132* 110* 131* 102*  BUN 12 10 17  29* 27* 30* 5* 17  CREATININE 0.60 0.73 0.81 0.73 0.63 0.65 0.75 0.42*  CALCIUM 8.1* 8.1* 8.7* 8.8* 8.8* 8.9 9.7 9.0  MG 2.0 1.9 2.3 2.6*  --  2.5*  --   --   PHOS 3.2 3.1 4.2 4.2  --  3.0  --   --    Liver Function Tests:  Recent Labs Lab 08/10/15 0420 08/11/15 0400 08/11/15 1543 08/12/15 0510 08/13/15 0530 08/14/15 0520  AST 202* 149*  --  99* 68* 50*  ALT 364* 327*  --  243* 181* 131*  ALKPHOS 101 106  --  106 97 92  BILITOT 1.7* 1.8*  --  1.7* 1.4* 1.1  PROT 5.2* 6.5 6.1* 6.4* 6.9 6.5  ALBUMIN 2.2* 2.7*  --  2.7* 2.9* 3.0*   CBC:  Recent Labs Lab 08/11/15 0400 08/12/15 0510 08/13/15 0530 08/14/15 0520 08/15/15 0515  WBC 7.2 6.2 7.2 10.9* 12.2*  HGB 10.2* 10.1* 10.5* 10.3* 13.4  HCT 29.5* 30.8* 31.6* 31.9* 42.0  MCV 91.9 92.2 93.2 93.8 95.7  PLT 277 290 303 298 216   BNP (last 3 results)  Recent Labs  11/21/14 2324  BNP 196.6*   CBG:  Recent Labs Lab 08/12/15 1625 08/12/15 2018 08/13/15 0043 08/13/15 0533 08/13/15 0848  GLUCAP 91 113* 101* 98 108*    Recent Results (from the past 240 hour(s))  Body fluid culture     Status: None   Collection Time: 08/11/15  1:08 PM  Result Value Ref Range Status   Specimen Description FLUID LEFT PLEURAL  Final   Special Requests Normal  Final   Gram Stain   Final    FEW WBC PRESENT,BOTH PMN AND MONONUCLEAR NO ORGANISMS SEEN    Culture NO GROWTH 3 DAYS  Final   Report Status 08/14/2015 FINAL  Final  Fungus Culture with Smear     Status: None (Preliminary result)   Collection Time: 08/11/15  1:08 PM  Result Value Ref Range Status   Specimen Description FLUID PLEURAL LEFT  Final   Special Requests NONE  Final   Fungal Smear   Final    NO YEAST OR FUNGAL ELEMENTS SEEN Performed at [facility]    Culture    Final    CULTURE IN PROGRESS FOR FOUR WEEKS Performed at [facility]    Report Status PENDING  Incomplete     Studies: Dg Chest 2 View  08/16/2015  CLINICAL DATA:  ICD placement.  History of mitral valve replacement. EXAM: CHEST  2 VIEW COMPARISON:  08/15/2015 and prior exams FINDINGS: A left-sided dual lead ICD is now noted with leads overlying the right atrium and right ventricle. There is no evidence of pneumothorax. Cardiomegaly,, cardiac valve replacement, left atrial appendage clip again noted. A small left pleural effusion with left basilar atelectasis and trace right pleural effusion noted. IMPRESSION: Left ICD placement -no evidence of pneumothorax. Small left pleural effusion, left basilar atelectasis and trace right pleural effusion again noted. Electronically Signed   By: Henrietta Hoover.D.  On: 08/16/2015 07:18   Dg Chest 2 View  08/15/2015  CLINICAL DATA:  Follow-up pleural effusion EXAM: CHEST  2 VIEW COMPARISON:  08/13/2015 FINDINGS: Right pleural effusion improved. Small left pleural effusion is stable. No pneumothorax. Postoperative changes are stable. Left jugular venous catheter removed. Hyperaeration. Minimal atelectasis at the left base. IMPRESSION: Improved right pleural effusion. Stable left pleural effusion. No pneumothorax. Electronically Signed   By: Jolaine Click M.D.   On: 08/15/2015 10:36   Mr Card Morphology Wo/w Cm  08/14/2015  CLINICAL DATA:  Cardiac Arrest, Long QT, Cardiomyopathy EXAM: CARDIAC MRI TECHNIQUE: The patient was scanned on a 1.5 Tesla GE magnet. A dedicated cardiac coil was used. Functional imaging was done using Fiesta sequences. 2,3, and 4 chamber views were done to assess for RWMA's. Modified Simpson's rule using a short axis stack was used to calculate an ejection fraction on a dedicated work [employer]. The patient received 20 cc of Multihance. After 10 minutes inversion recovery sequences were used to assess for  infiltration and scar tissue. CONTRAST:  20 cc Multihance FINDINGS: There was mild LAE. The RA and RV were normal. The LV was mildly enlarged. There was mildly asymmetric hypertrophy of the septum Septum measures 15 mm and posterior wall 10 mm. There is no SAM. The patient is s/p mitral repair with an annuloplasty ring. There is no significant residual MR with mild diastolic bowing. The LAA has been clipped and has no flow. The aortic valve is normal as is the aortic root There is no residual PDA identified. There is a small pericardial effusion and large left pleural effusion. The distal septum and apex appear hypokinetic. The quantitative EF is 45% (EDV 70 cc, ESV 39 cc SV 31 cc) There is a markedly abnormal gadolinium uptake in the LV myocardium on inversion recovery sequences. This uptake is diffuse in the endocardial and mid epicardial areas. Although uptake is diffuse it is especially marked in the inferior wall. IMPRESSION: 1) Mildly asymmetric septal hypertrophy 15 mm. Distal septal apical hypokinesis EF 45% 2) Diffuse delayed gadolinium uptake in the subendocardial and mid epicardial areas especially marked in the inferior wall 3) S/P mitral valve repair with no residual MR and annuloplasty ring 4) Atri-Clip of the LAA with no flow 5) No residual PDA flow 6) Mild LAE 7) Small pericardial effusion 8) Large left pleural effusion Note delayed gadolinium findings may be consistent with myocarditis vs infiltrative cardiomyopathy Charlton Haws Electronically Signed   By: Charlton Haws M.D.   On: 08/14/2015 20:35    Scheduled Meds: . antiseptic oral rinse  7 mL Mouth Rinse BID  . feeding supplement  1 Container Oral BID BM  . pantoprazole  40 mg Oral Q1200   Continuous Infusions:    Principal Problem:   Asystole (HCC) Active Problems:   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   Long term (current) use of anticoagulants [Z79.01]   Cardiac arrest (HCC)   Respiratory arrest  (HCC)   Cardiogenic shock (HCC)   Central line insertion site infection   Congestive heart failure (HCC)   Hypokalemia   Long QT interval   Valvular cardiomyopathy (HCC)   Atrial fibrillation (HCC)   Paroxysmal ventricular tachycardia (HCC)    Time spent: 25 minutes    Vassie Loll  [facility] Pager [phone]. If 7PM-7AM, please contact night-coverage at [email or web address], password [facility] 08/16/2015, 11:13 AM  LOS: 11 days

## 2015-08-16 NOTE — Progress Notes (Signed)
Patient ID: Sheva Oder, female   DOB: 02/23/1961, 55 y.o.   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 No sob. Almost passed out yesterday. Mild incisional pain.  CURRENT MEDS . antiseptic oral rinse  7 mL Mouth Rinse BID  . feeding supplement  1 Container Oral BID BM  . furosemide  20 mg Oral Daily  . pantoprazole  40 mg Oral Q1200    OBJECTIVE  Filed Vitals:   08/16/15 0445 08/16/15 0500 08/16/15 0654 08/16/15 0843  BP: 102/65 106/66  92/52  Pulse: 82   84  Temp: 98.2 F (36.8 C)   98.2 F (36.8 C)  TempSrc: Oral   Oral  Resp: 20   20  Height:      Weight:   104 lb 14.4 oz (47.582 kg)   SpO2: 100%   97%    Intake/Output Summary (Last 24 hours) at 08/16/15 1013 Last data filed at 08/16/15 0430  Gross per 24 hour  Intake    220 ml  Output   1050 ml  Net   -830 ml   Filed Weights   08/14/15 0400 08/15/15 0450 08/16/15 0654  Weight: 104 lb 15 oz (47.6 kg) 103 lb 14.4 oz (47.129 kg) 104 lb 14.4 oz (47.582 kg)    PHYSICAL EXAM  General: Pleasant, NAD. Neuro: Alert and oriented X 3. Moves all extremities spontaneously. Psych: Normal affect. HEENT:  Normal  Neck: Supple without bruits or JVD. Lungs:  Resp regular and unlabored, CTA. No hematoma or ecchymosis Heart: RRR no s3, s4, or murmurs. Abdomen: Soft, non-tender, non-distended, BS + x 4.  Extremities: No clubbing, cyanosis or edema. DP/PT/Radials 2+ and equal  bilaterally.  Accessory Clinical Findings  CBC  Recent Labs  08/14/15 0520 08/15/15 0515  WBC 10.9* 12.2*  HGB 10.3* 13.4  HCT 31.9* 42.0  MCV 93.8 95.7  PLT 298 216   Basic Metabolic Panel  Recent Labs  08/14/15 0520 08/15/15 0515 08/16/15 0419  NA 134* 139 134*  K 3.3* 4.2 3.6  CL 88* 106 97*  CO2 34* 23 31  GLUCOSE 110* 131* 102*  BUN 30* 5* 17  CREATININE 0.65 0.75 0.42*  CALCIUM 8.9 9.7 9.0  MG 2.5*  --   --   PHOS 3.0  --   --    Liver Function Tests  Recent Labs  08/14/15 0520  AST 50*  ALT 131*  ALKPHOS 92  BILITOT 1.1  PROT 6.5  ALBUMIN 3.0*   No results for input(s): LIPASE, AMYLASE in the last 72 hours. Cardiac Enzymes No results for input(s): CKTOTAL, CKMB, CKMBINDEX, TROPONINI in the last 72 hours. BNP Invalid input(s): POCBNP D-Dimer No results for input(s): DDIMER in the last 72 hours. Hemoglobin A1C No results for input(s): HGBA1C in the last 72 hours. Fasting Lipid Panel No results for input(s): CHOL, HDL, LDLCALC, TRIG, CHOLHDL, LDLDIRECT in the last 72 hours. Thyroid Function  Tests No results for input(s): TSH, T4TOTAL, T3FREE, THYROIDAB in the last 72 hours.  Invalid input(s): FREET3  TELE  nsr   Radiology/Studies  Reviewed. CXR with no PTX and stable lead position.  ASSESSMENT AND PLAN  1. VT 2. Cardiac arrest 3. S/p MVR 4. S/p ICD implant 5. Weakness after prolonged bed rest Rec: her ICD is working normally today and CXR is stable. She will likely need some rehab. Ok to advance activity today.   Exie Chrismer,M.D.  08/16/2015 10:13 AM

## 2015-08-17 LAB — BASIC METABOLIC PANEL
ANION GAP: 6 (ref 5–15)
BUN: 17 mg/dL (ref 6–20)
CHLORIDE: 96 mmol/L — AB (ref 101–111)
CO2: 29 mmol/L (ref 22–32)
CREATININE: 0.49 mg/dL (ref 0.44–1.00)
Calcium: 8.7 mg/dL — ABNORMAL LOW (ref 8.9–10.3)
GFR calc non Af Amer: >60 mL/min (ref >60)
Glucose, Bld: 98 mg/dL (ref 65–99)
Potassium: 3.7 mmol/L (ref 3.5–5.1)
Sodium: 131 mmol/L — ABNORMAL LOW (ref 135–145)

## 2015-08-17 MED ORDER — WARFARIN SODIUM 2 MG PO TABS
2.0000 mg | ORAL_TABLET | Freq: Once | ORAL | Status: AC
  Administered 2015-08-17: 2 mg via ORAL
  Filled 2015-08-17: qty 1

## 2015-08-17 MED ORDER — WARFARIN - PHARMACIST DOSING INPATIENT
Freq: Every day | Status: DC
  Administered 2015-08-17: 18:00:00

## 2015-08-17 NOTE — Progress Notes (Signed)
[facility] PROGRESS NOTE  Diane [name] WUJ:[id number] DOB: 1961-06-12 DOA: 08/05/2015 PCP: Emeterio Reeve, MD  Assessment/Plan: 1-VDRF in setting of PEA: -extubated on 1/24 -no SOB and good O2 sat on RA -continue flutter valve/IS -overall stable. -S/P Cardiac MRI and dual chamber ICD implantation (given PEA and VT episode) -will need SNF for rehabilitation.  2-aspiration PNA:  -treated -patient completed antibiotic therapy with unasyn  -no SOB, no fever and normal WBC's currently  3-acute on chronic combined heart failure/pulmonary edema: -improved/resolved -no signs of fluid overload and orthostatic changes on exam. -continue holding diuretics -good O2 sat on RA -cardiology on board; will follow rec's  4-atrial fibrillation: S/P LAA clipping 07/22/15  -rate controlled at this time -will follow cardiology rec's -on heparin and with plans to use coumadin short term -CHADsVASC score 0-1 -will might not need anticoagulation long term  5-hypokalemia -will replete as needed -BMET intermittently to follow electrolytes  6-transaminitis: in the setting of PEA -will monitor trend -continue trending down -no icterus  7-normocitic anemia: will monitor trend -no signs of overt bleeding currently -Hgb stable  8-GI prophylaxis: continue PPI   Code Status: Full Family Communication: no family at bedside  Disposition Plan: remains inpatient; S/P dual chamber ICD placement on 1/27. Will follow cardiology rec's. Continue working with PT. Will need SNF for rehab. Ready for discharge most likely in am   Consultants:  PCCM  Cardiology service   Procedures and significant events: 1/17 Admitted for PEA/VT arrest, intubated, placed on hypothermic protocol  1/17 CTA chest with no PO, moderate bilateral pleural effusions and consolidative changes of lower lobes 1/17 Echo with normal EF, mild LVH, mild LAW, trace AI, mild MS, no MR, small pericardial effusion, left  pleural effusion 1/18 EEG with generalized slowing  1/19 Rewarmed, paralytics, sedation turned off 1/19 CT head normal 1/20 CXR with left sided pleural effusion and b/l pulmonary edema  1/20 INR 9.4 requiring FFP and vitamin K 1/20 IV amio stopped  1/20 EEG with generalized slowing  1/21 Improved mental status, start IV heparin  1/22 Vomited tube feeds, KUB normal  1/23 Thoracentesis of large left effusion with 950 cc semi-hemorrhagic fluid. IV heparin stopped. 1/23 2D-echo with EF 55-60% MV with no significant regurgitation  1/24- extubated 1/27 pacemaker/ICd implantation   Antibiotics:  Unasyn 1/18>>>1/24   Cefazolin prophylaxis for pacemaker/ICD placement on 1/27  HPI/Subjective: Afebrile, no CP, no SOB. Hoarse from intubation. Complaining of weakness and deconditioning. No acute distress. Some frustration and mild agitation given lack of immediate attention when using call button   Objective: Filed Vitals:   08/17/15 0027 08/17/15 0418  BP: 98/44 98/79  Pulse: 86 86  Temp: 97.7 F (36.5 C) 98.3 F (36.8 C)  Resp: 17 17    Intake/Output Summary (Last 24 hours) at 08/17/15 1352 Last data filed at 08/17/15 0500  Gross per 24 hour  Intake    240 ml  Output      0 ml  Net    240 ml   Filed Weights   08/15/15 0450 08/16/15 0654 08/17/15 0418  Weight: 47.129 kg (103 lb 14.4 oz) 47.582 kg (104 lb 14.4 oz) 46.6 kg (102 lb 11.8 oz)    Exam:   General:  Afebrile; denies CP and SOB. Patient feeling weak and deconditioned. Slightly agitated this morning, because lack of immediate attention when calling nurse.  AAOX3  Cardiovascular: rate controlled, no rubs or gallops; positive SEM. Good healing mid chest incision.  Respiratory: CTA bilaterally  Abdomen:  soft, NT, ND; positive BS  Musculoskeletal: no edema, no cyanosis   Data Reviewed: Basic Metabolic Panel:  Recent Labs Lab 08/11/15 0400 08/12/15 0510 08/13/15 0530 08/13/15 2138 08/14/15 0520  08/15/15 0515 08/16/15 0419 08/17/15 0538  NA 133* 134* 134* 133* 134* 139 134* 131*  K 3.0* 3.4* 3.0* 3.6 3.3* 4.2 3.6 3.7  CL 92* 92* 86* 88* 88* 106 97* 96*  CO2 25 26 32 34* 34* 23 31 29   GLUCOSE 105* 102* 104* 132* 110* 131* 102* 98  BUN 10 17 29* 27* 30* 5* 17 17  CREATININE 0.73 0.81 0.73 0.63 0.65 0.75 0.42* 0.49  CALCIUM 8.1* 8.7* 8.8* 8.8* 8.9 9.7 9.0 8.7*  MG 1.9 2.3 2.6*  --  2.5*  --   --   --   PHOS 3.1 4.2 4.2  --  3.0  --   --   --    Liver Function Tests:  Recent Labs Lab 08/11/15 0400 08/11/15 1543 08/12/15 0510 08/13/15 0530 08/14/15 0520  AST 149*  --  99* 68* 50*  ALT 327*  --  243* 181* 131*  ALKPHOS 106  --  106 97 92  BILITOT 1.8*  --  1.7* 1.4* 1.1  PROT 6.5 6.1* 6.4* 6.9 6.5  ALBUMIN 2.7*  --  2.7* 2.9* 3.0*   CBC:  Recent Labs Lab 08/11/15 0400 08/12/15 0510 08/13/15 0530 08/14/15 0520 08/15/15 0515  WBC 7.2 6.2 7.2 10.9* 12.2*  HGB 10.2* 10.1* 10.5* 10.3* 13.4  HCT 29.5* 30.8* 31.6* 31.9* 42.0  MCV 91.9 92.2 93.2 93.8 95.7  PLT 277 290 303 298 216   BNP (last 3 results)  Recent Labs  11/21/14 2324  BNP 196.6*   CBG:  Recent Labs Lab 08/12/15 1625 08/12/15 2018 08/13/15 0043 08/13/15 0533 08/13/15 0848  GLUCAP 91 113* 101* 98 108*    Recent Results (from the past 240 hour(s))  Body fluid culture     Status: None   Collection Time: 08/11/15  1:08 PM  Result Value Ref Range Status   Specimen Description FLUID LEFT PLEURAL  Final   Special Requests Normal  Final   Gram Stain   Final    FEW WBC PRESENT,BOTH PMN AND MONONUCLEAR NO ORGANISMS SEEN    Culture NO GROWTH 3 DAYS  Final   Report Status 08/14/2015 FINAL  Final  Fungus Culture with Smear     Status: None (Preliminary result)   Collection Time: 08/11/15  1:08 PM  Result Value Ref Range Status   Specimen Description FLUID PLEURAL LEFT  Final   Special Requests NONE  Final   Fungal Smear   Final    NO YEAST OR FUNGAL ELEMENTS SEEN Performed at [facility]    Culture   Final    CULTURE IN PROGRESS FOR FOUR WEEKS Performed at [facility]    Report Status PENDING  Incomplete     Studies: Dg Chest 2 View  08/16/2015  CLINICAL DATA:  ICD placement.  History of mitral valve replacement. EXAM: CHEST  2 VIEW COMPARISON:  08/15/2015 and prior exams FINDINGS: A left-sided dual lead ICD is now noted with leads overlying the right atrium and right ventricle. There is no evidence of pneumothorax. Cardiomegaly,, cardiac valve replacement, left atrial appendage clip again noted. A small left pleural effusion with left basilar atelectasis and trace right pleural effusion noted. IMPRESSION: Left ICD placement -no evidence of pneumothorax. Small left pleural effusion, left basilar atelectasis and trace right pleural  effusion again noted. Electronically Signed   By: Harmon Pier M.D.   On: 08/16/2015 07:18    Scheduled Meds: . antiseptic oral rinse  7 mL Mouth Rinse BID  . feeding supplement  1 Container Oral BID BM  . pantoprazole  40 mg Oral Q1200   Continuous Infusions:    Principal Problem:   Asystole (HCC) Active Problems:   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   Long term (current) use of anticoagulants [Z79.01]   Cardiac arrest (HCC)   Respiratory arrest (HCC)   Cardiogenic shock (HCC)   Central line insertion site infection   Congestive heart failure (HCC)   Hypokalemia   Long QT interval   Valvular cardiomyopathy (HCC)   Atrial fibrillation (HCC)   Paroxysmal ventricular tachycardia (HCC)    Time spent: 25 minutes    Vassie Loll  [facility] Pager [phone]. If 7PM-7AM, please contact night-coverage at [email or web address], password [facility] 08/17/2015, 1:52 PM  LOS: 12 days

## 2015-08-17 NOTE — Progress Notes (Signed)
Patient's IV is out and has refused IV restart. She is currently not receiving any IV medications. Dr. Gwenlyn Perking made aware. Will continue to monitor patient.

## 2015-08-17 NOTE — Progress Notes (Signed)
ANTICOAGULATION CONSULT NOTE - Initial Consult  Pharmacy Consult for coumadin Indication: atrial fibrillation  Allergies  Allergen Reactions  . Vicodin [Hydrocodone-Acetaminophen] Other (See Comments)    "she feels out of it", hallucinating  . Adhesive [Tape] Itching and Rash  . Silicone Itching and Rash    TAPE ALLERGY/EKG STICKER ALLERGY, Please use "paper" tape only    Patient Measurements: Height: 5\' 2"  (157.5 cm) Weight: 102 lb 11.8 oz (46.6 kg) IBW/kg (Calculated) : 50.1  Vital Signs: Temp: 98.3 F (36.8 C) (01/29 0418) Temp Source: Oral (01/29 0418) BP: 98/79 mmHg (01/29 0418) Pulse Rate: 86 (01/29 0418)  Labs:  Recent Labs  08/15/15 0515 08/16/15 0419 08/17/15 0538  HGB 13.4  --   --   HCT 42.0  --   --   PLT 216  --   --   CREATININE 0.75 0.42* 0.49    Estimated Creatinine Clearance: 57.8 mL/min (by C-G formula based on Cr of 0.49).   Medical History: Past Medical History  Diagnosis Date  . Severe mitral regurgitation 07/03/2015  . Chronic combined systolic and diastolic CHF (congestive heart failure) (HCC) 07/03/2015    Grade 3 diastolic dysfunction.  LVEF 40-45% 05/2015.  Marland Kitchen Aortic aneurysm (HCC)   . Patent ductus arteriosus 07/16/2015  . Depression     SOME DEPRESSION (ON MEDS FOR 2 YRS) WHEN HER MOTHER PASSED  . 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 Pt was on warfarin PTA for MVR. Was on heparin during this admission initially. Heparin stopped prior to ICD implant 1/27. Per EP note, Resume coumadin for short-term. Discussed with Dr. Gwenlyn Perking, ok to start coumadin with no bridging.  PTA dose: 2mg  daily, INR 2.54 on admission.  Goal of Therapy:  INR 2-3 Monitor platelets by anticoagulation protocol: Yes   Plan:   Coumadin 2mg  po x 1 Daily PT/INR  Bayard Hugger, PharmD, BCPS  Clinical Pharmacist  Pager: [phone]   08/17/2015,2:53 PM

## 2015-08-18 ENCOUNTER — Encounter: Payer: BLUE CROSS/BLUE SHIELD | Admitting: Thoracic Surgery (Cardiothoracic Vascular Surgery)

## 2015-08-18 ENCOUNTER — Telehealth: Payer: Self-pay | Admitting: *Deleted

## 2015-08-18 ENCOUNTER — Encounter (HOSPITAL_COMMUNITY): Payer: Self-pay | Admitting: Cardiology

## 2015-08-18 DIAGNOSIS — I480 Paroxysmal atrial fibrillation: Secondary | ICD-10-CM

## 2015-08-18 DIAGNOSIS — I951 Orthostatic hypotension: Secondary | ICD-10-CM | POA: Insufficient documentation

## 2015-08-18 LAB — BASIC METABOLIC PANEL
ANION GAP: 10 (ref 5–15)
BUN: 13 mg/dL (ref 6–20)
CALCIUM: 9 mg/dL (ref 8.9–10.3)
CO2: 28 mmol/L (ref 22–32)
Chloride: 98 mmol/L — ABNORMAL LOW (ref 101–111)
Creatinine, Ser: 0.5 mg/dL (ref 0.44–1.00)
GFR calc Af Amer: >60 mL/min (ref >60)
Glucose, Bld: 95 mg/dL (ref 65–99)
POTASSIUM: 4 mmol/L (ref 3.5–5.1)
SODIUM: 136 mmol/L (ref 135–145)

## 2015-08-18 LAB — PROTIME-INR
INR: 1.18 (ref 0.00–1.49)
PROTHROMBIN TIME: 15.2 s (ref 11.6–15.2)

## 2015-08-18 MED ORDER — MIDODRINE HCL 5 MG PO TABS
2.5000 mg | ORAL_TABLET | ORAL | Status: DC
  Administered 2015-08-18 – 2015-08-19 (×4): 2.5 mg via ORAL
  Filled 2015-08-18 (×4): qty 1

## 2015-08-18 MED ORDER — WARFARIN SODIUM 2 MG PO TABS
2.0000 mg | ORAL_TABLET | Freq: Once | ORAL | Status: AC
  Administered 2015-08-18: 2 mg via ORAL
  Filled 2015-08-18: qty 1

## 2015-08-18 MED ORDER — MORPHINE SULFATE (PF) 2 MG/ML IV SOLN: 2.0000 mg | INTRAVENOUS | Status: DC | PRN

## 2015-08-18 MED ORDER — MIDODRINE HCL 5 MG PO TABS: 2.5000 mg | ORAL_TABLET | ORAL | Status: DC

## 2015-08-18 NOTE — Progress Notes (Signed)
On way to the bathroom with patient, patient started out walking with the walker well. Patient made it to the toilet and made 650 of amber urine. Pt stood up with help from RN. Pt wanted to wash her hands at the sink and was visibly about to faint. Pt states her "coordination is off, I feel a little lightheaded."  Patient very winded and weak. RN assisted patient back to recliner. Will continue to monitor.

## 2015-08-18 NOTE — Progress Notes (Signed)
[facility] PROGRESS NOTE  Diane [name] ZOX:[id number] DOB: July 10, 1961 DOA: 08/05/2015 PCP: Emeterio Reeve, MD  Assessment/Plan: 1-VDRF in setting of PEA: -extubated on 1/24 -no SOB and good O2 sat on RA -continue flutter valve/IS -overall stable. -S/P Cardiac MRI and dual chamber ICD implantation (given PEA and VT episode) -will need SNF for rehabilitation.  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 -continue holding diuretics and any antihypertensive currently  -good O2 sat on RA -cardiology on board; will follow rec's -trial of midodrine will be initiated    4-atrial fibrillation: S/P LAA clipping 07/22/15  -rate controlled at this time -will follow cardiology rec's -on heparin and with plans to use coumadin short term -CHADsVASC score 0-1 -will might not need anticoagulation long term  5-hypokalemia -will replete as needed -BMET intermittently to follow electrolytes -K 4.0 (1/30)  6-transaminitis: in the setting of PEA -will monitor trend -continue trending down -no icterus  7-normocitic anemia: will monitor trend -no signs of overt bleeding currently -Hgb stable  8-GI prophylaxis: continue PPI  9-orthostatic hypotension -will do trial with midodrine  -continue PT   Code Status: Full Family Communication: no family at bedside  Disposition Plan: remains inpatient; S/P dual chamber ICD placement on 1/27. Will follow cardiology rec's. Continue working with PT. Will need SNF for rehab. Ready for discharge most likely in am   Consultants:  PCCM  Cardiology service   Procedures and significant events: 1/17 Admitted for PEA/VT arrest, intubated, placed on hypothermic protocol  1/17 CTA chest with no PO,  moderate bilateral pleural effusions and consolidative changes of lower lobes 1/17 Echo with normal EF, mild LVH, mild LAW, trace AI, mild MS, no MR, small pericardial effusion, left pleural effusion 1/18 EEG with generalized slowing  1/19 Rewarmed, paralytics, sedation turned off 1/19 CT head normal 1/20 CXR with left sided pleural effusion and b/l pulmonary edema  1/20 INR 9.4 requiring FFP and vitamin K 1/20 IV amio stopped  1/20 EEG with generalized slowing  1/21 Improved mental status, start IV heparin  1/22 Vomited tube feeds, KUB normal  1/23 Thoracentesis of large left effusion with 950 cc semi-hemorrhagic fluid. IV heparin stopped. 1/23 2D-echo with EF 55-60% MV with no significant regurgitation  1/24- extubated 1/27 pacemaker/ICd implantation   Antibiotics:  Unasyn 1/18>>>1/24   Cefazolin prophylaxis for pacemaker/ICD placement on 1/27  HPI/Subjective: Afebrile, no CP, no SOB. Hoarse from intubation (but improved). Complaining of weakness and deconditioning. With orthostasis episode overnight and dizzy/lightheaded while working with PT.  Objective: Filed Vitals:   08/18/15 0946 08/18/15 1251  BP: 121/51 90/47  Pulse:    Temp:    Resp:      Intake/Output Summary (Last 24 hours) at 08/18/15 1405 Last data filed at 08/18/15 1134  Gross per 24 hour  Intake    240 ml  Output   1450 ml  Net  -1210 ml   Filed Weights   08/16/15 0654 08/17/15 0418 08/18/15 0500  Weight: 47.582 kg (104 lb 14.4 oz) 46.6 kg (102 lb 11.8 oz) 47.174 kg (104 lb)    Exam:   General:  Afebrile; denies CP and SOB. Patient feeling weak and deconditioned. With episode of orthostasis overnight and feeling dizzy/lightheaded while working with PT this morning. AAOX3 and otherwise in no distress.  Cardiovascular: rate controlled,  no rubs or gallops; positive SEM. Good healing mid chest incision.  Respiratory: CTA bilaterally  Abdomen: soft, NT, ND; positive BS  Musculoskeletal: no  edema, no cyanosis   Data Reviewed: Basic Metabolic Panel:  Recent Labs Lab 08/12/15 0510 08/13/15 0530  08/14/15 0520 08/15/15 0515 08/16/15 0419 08/17/15 0538 08/18/15 0535  NA 134* 134*  < > 134* 139 134* 131* 136  K 3.4* 3.0*  < > 3.3* 4.2 3.6 3.7 4.0  CL 92* 86*  < > 88* 106 97* 96* 98*  CO2 26 32  < > 34* 23 31 29 28   GLUCOSE 102* 104*  < > 110* 131* 102* 98 95  BUN 17 29*  < > 30* 5* 17 17 13   CREATININE 0.81 0.73  < > 0.65 0.75 0.42* 0.49 0.50  CALCIUM 8.7* 8.8*  < > 8.9 9.7 9.0 8.7* 9.0  MG 2.3 2.6*  --  2.5*  --   --   --   --   PHOS 4.2 4.2  --  3.0  --   --   --   --   < > = values in this interval not displayed. Liver Function Tests:  Recent Labs Lab 08/11/15 1543 08/12/15 0510 08/13/15 0530 08/14/15 0520  AST  --  99* 68* 50*  ALT  --  243* 181* 131*  ALKPHOS  --  106 97 92  BILITOT  --  1.7* 1.4* 1.1  PROT 6.1* 6.4* 6.9 6.5  ALBUMIN  --  2.7* 2.9* 3.0*   CBC:  Recent Labs Lab 08/12/15 0510 08/13/15 0530 08/14/15 0520 08/15/15 0515  WBC 6.2 7.2 10.9* 12.2*  HGB 10.1* 10.5* 10.3* 13.4  HCT 30.8* 31.6* 31.9* 42.0  MCV 92.2 93.2 93.8 95.7  PLT 290 303 298 216   BNP (last 3 results)  Recent Labs  11/21/14 2324  BNP 196.6*   CBG:  Recent Labs Lab 08/12/15 1625 08/12/15 2018 08/13/15 0043 08/13/15 0533 08/13/15 0848  GLUCAP 91 113* 101* 98 108*    Recent Results (from the past 240 hour(s))  Body fluid culture     Status: None   Collection Time: 08/11/15  1:08 PM  Result Value Ref Range Status   Specimen Description FLUID LEFT PLEURAL  Final   Special Requests Normal  Final   Gram Stain   Final    FEW WBC PRESENT,BOTH PMN AND MONONUCLEAR NO ORGANISMS SEEN    Culture NO GROWTH 3 DAYS  Final   Report Status 08/14/2015 FINAL  Final  Fungus Culture with Smear     Status: None (Preliminary result)   Collection Time: 08/11/15  1:08 PM  Result Value Ref Range Status   Specimen Description FLUID PLEURAL LEFT  Final   Special  Requests NONE  Final   Fungal Smear   Final    NO YEAST OR FUNGAL ELEMENTS SEEN Performed at [facility]    Culture   Final    CULTURE IN PROGRESS FOR FOUR WEEKS Performed at [facility]    Report Status PENDING  Incomplete     Studies: No results found.  Scheduled Meds: . antiseptic oral rinse  7 mL Mouth Rinse BID  . feeding supplement  1 Container Oral BID BM  . midodrine  2.5 mg Oral Q4H  . pantoprazole  40 mg Oral Q1200  . warfarin  2 mg Oral ONCE-1800  . Warfarin - Pharmacist Dosing Inpatient   Does not apply q1800   Continuous Infusions:  Principal Problem:   Asystole (HCC) Active Problems:   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   Long term (current) use of anticoagulants [Z79.01]   Cardiac arrest (HCC)   Respiratory arrest (HCC)   Cardiogenic shock (HCC)   Central line insertion site infection   Congestive heart failure (HCC)   Hypokalemia   Long QT interval   Valvular cardiomyopathy (HCC)   Atrial fibrillation (HCC)   Paroxysmal ventricular tachycardia (HCC)    Time spent: 25 minutes    Vassie Loll  [facility] Pager [phone]. If 7PM-7AM, please contact night-coverage at [email or web address], password [facility] 08/18/2015, 2:05 PM  LOS: 13 days

## 2015-08-18 NOTE — Clinical Social Work Note (Signed)
Patient has a bed with [facility] with 7 day LOG pending BCBS authorization. Per facility, they need PT and OT notes indicating a need for SNF, not home health physical therapy. Patient is agreeable to discharge plan. Report left for covering CSW.   Roddie Mc MSW, [place], [place], [phone]

## 2015-08-18 NOTE — Progress Notes (Signed)
Patient complaint of "chest tightness and heaviness all across my chest and my back." Pt states no actual pain. EKG obtained, MD notified. See new orders. Will continue to monitor.   With some education, the patient did allow me to start an IV. We have access.   Patient states now that "the chest tightness and heaviness has resolved' on its own". Patient wants to take a nap, she states she is "very tired". Will continue to monitor.

## 2015-08-18 NOTE — Telephone Encounter (Signed)
-----   Message from Marily Lente, NP sent at 08/16/2015  6:49 AM EST ----- TOC visit scheduled for 2/9.  Please call Monday.  Thank you!

## 2015-08-18 NOTE — Care Management Note (Addendum)
Case Management Note  Patient Details  Name: Diane [name] MRN: [id number] Date of Birth: 05/17/1961  Subjective/Objective: Pt admitted for cardiac arrest. Pt having orthostatic hypotension. Initiated on PO Midodrine.                    Action/Plan: Plan will be for d/c to [facility] SNF 08-19-15. CSW assisting with disposition needs.    Expected Discharge Date:                  Expected Discharge Plan:  Skilled Nursing Facility  In-House Referral:  Clinical Social Work  Discharge planning Services  CM Consult  Post Acute Care Choice:  Home Health Services Choice offered to:  Patient  DME Arranged:  N/A DME Agency:  NA  HH Arranged:  Registered Nurse and Physical THerapy HH Agency:  [employer] logistics/support/administrative officer Status of Service:  Completed, signed off  Medicare Important Message Given:    Date Medicare IM Given:    Medicare IM give by:    Date Additional Medicare IM Given:    Additional Medicare Important Message give by:     If discussed at Long Length of Stay Meetings, dates discussed: 08-26-15    Additional Comments: 1028 08-28-15 [name], RN, BSN [phone] CM did speak with pt in regards to SNF vs [facility]. Pt states she will have 24 hour supervision at home once d/c. Pt is refusing SNF at this time. CM stressed the idea of having a safe discharge plan. Pt ensured CM that she would have supervision. CM did offer choice for [facility] and pt chose [place]. CM did make referral for [facility] and SOC to begin within 24-48 hours post d/c. Pt stated she will only need HHRN and PT services. CM did call [facility] to set pt up with the [facility]- Lab draws via [facility] and they will call results to [facility] and then f/u will be scheduled. Pt states she ordered a RW and will be delivered tomorrow. No further needs from CM at this time.    1426 [name], RN, BSN [phone] Pt copay will be 25% coinsurance- prior Berkley Harvey is required [phone] [id number] [id number]  1143  08-27-15 [name], RN, BSN [phone] CM did a Benefits Check for [facility]. CM will make pt aware of cost once completed.    4388 08-27-15 [name], RN,BSN [phone] Pt with continued orthostatic bp- Initiated on IVF 08-26-15. CM will continue to monitor for disposition needs. CSW assisting with disposition needs to SNF.  [name], RN 08/18/2015, 2:25 PM

## 2015-08-18 NOTE — Progress Notes (Signed)
Physical Therapy Treatment Patient Details Name: Diane [name] MRN: [id number] DOB: 1961/02/20 Today's Date: 08/18/2015    History of Present Illness on 07/22/15 presents back on 08/05/15 to be in PEA/VT arrest with 3 shocks and ROSC after 7-9 minutes requiring hypothermic protocol. Patient developed large left effusion in setting resp failure and was extubated on 1/24. ICD placement on 1/28.    PT Comments    Pt able to advance gt distance with decreased complaints of dizziness although dizziness remains.  Pt performed without orthostatic change during mobility.  Pt motivated to gain strength and tolerated therapeutic exercise well.    Follow Up Recommendations  Home health PT;Supervision/Assistance - 24 hour     Equipment Recommendations  None recommended by PT    Recommendations for Other Services       Precautions / Restrictions Precautions Precautions: Sternal;ICD/Pacemaker (low BP (syncopal episodes)) Restrictions Weight Bearing Restrictions: No    Mobility  Bed Mobility Overal bed mobility:  (Pt recieved in recliner chair.  )                Transfers Overall transfer level: Needs assistance Equipment used: None Transfers: Sit to/from Stand Sit to Stand: +2 safety/equipment;Min assist         General transfer comment: Pt req cues for rocking motion forward to create momentum to complete transfer vs. pushing with UEs.    Ambulation/Gait Ambulation/Gait assistance: Min assist Ambulation Distance (Feet): 52 Feet Assistive device: 1 person hand held assist Gait Pattern/deviations: Step-through pattern;Decreased stride length;Narrow base of support;Drifts right/left     General Gait Details: Pt with mild complaints of dizziness there fore further gt distance deferred at this time.  Pt req cues for pacing vs. signs of too much.     Stairs            Wheelchair Mobility    Modified Rankin (Stroke Patients Only)       Balance     Sitting  balance-Leahy Scale: Fair       Standing balance-Leahy Scale: Fair                      Cognition Arousal/Alertness: Awake/alert Behavior During Therapy: WFL for tasks assessed/performed;Impulsive Overall Cognitive Status: Impaired/Different from baseline Area of Impairment:  (Pt requires edcuation on sternal and ICD precautions.  )         Safety/Judgement: Decreased awareness of safety   Problem Solving: Requires verbal cues General Comments: alert and oriented.  Pt highly motivated    Exercises General Exercises - Lower Extremity Long Arc Quad: AROM;Seated;Both;15 reps Hip ABduction/ADduction: Seated;AROM;15 reps (pillow squeeze and resisted hip abd.  ) Hip Flexion/Marching: AROM;Seated;Both;15 reps    General Comments        Pertinent Vitals/Pain Pain Assessment: No/denies pain    Home Living                      Prior Function            PT Goals (current goals can now be found in the care plan section) Acute Rehab PT Goals Patient Stated Goal: to walk Potential to Achieve Goals: Good Progress towards PT goals: Progressing toward goals    Frequency  Min 3X/week    PT Plan      Co-evaluation             End of Session Equipment Utilized During Treatment: Gait belt Activity Tolerance: Patient limited by fatigue (mild dizziness remains.  )  Patient left: in chair;with call bell/phone within reach;with chair alarm set     Time: 0902-0926 PT Time Calculation (min) (ACUTE ONLY): 24 min  Charges:  $Gait Training: 8-22 mins $Therapeutic Exercise: 8-22 mins                    G Codes:      Florestine Avers 09-01-15, 12:59 PM  Yumi Insalaco Aundria Rud, PTA

## 2015-08-18 NOTE — Telephone Encounter (Signed)
Pt is currently an inpatient

## 2015-08-18 NOTE — Progress Notes (Signed)
ANTICOAGULATION CONSULT NOTE - Follow up Consult  Pharmacy Consult for coumadin Indication: atrial fibrillation  Allergies  Allergen Reactions  . Vicodin [Hydrocodone-Acetaminophen] Other (See Comments)    "she feels out of it", hallucinating  . Adhesive [Tape] Itching and Rash  . Silicone Itching and Rash    TAPE ALLERGY/EKG STICKER ALLERGY, Please use "paper" tape only    Patient Measurements: Height: 5\' 2"  (157.5 cm) Weight: 104 lb (47.174 kg) IBW/kg (Calculated) : 50.1  Vital Signs: Temp: 98.4 F (36.9 C) (01/30 0500) Temp Source: Oral (01/30 0500) BP: 90/47 mmHg (01/30 1251) Pulse Rate: 100 (01/30 0500)  Labs:  Recent Labs  08/16/15 0419 08/17/15 0538 08/18/15 0535  LABPROT  --   --  15.2  INR  --   --  1.18  CREATININE 0.42* 0.49 0.50    Estimated Creatinine Clearance: 58.5 mL/min (by C-G formula based on Cr of 0.5).   Medical History: Past Medical History  Diagnosis Date  . Severe mitral regurgitation 07/03/2015  . Chronic combined systolic and diastolic CHF (congestive heart failure) (HCC) 07/03/2015    Grade 3 diastolic dysfunction.  LVEF 40-45% 05/2015.  Marland Kitchen Aortic aneurysm (HCC)   . Patent ductus arteriosus 07/16/2015  . Depression     SOME DEPRESSION (ON MEDS FOR 2 YRS) WHEN HER MOTHER PASSED  . 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 Pt was on warfarin PTA for MVR. Was on heparin during this admission initially. Heparin stopped prior to ICD implant 1/27. Per EP note, Resume coumadin for short-term. Discussed with Dr. Gwenlyn Perking, ok to start coumadin with no bridging.  PTA dose: 2mg  daily, INR 2.54 on admission.  Goal of Therapy:  INR 2-3 Monitor platelets by anticoagulation protocol: Yes   Plan:  Coumadin 2mg  po x  1 Daily PT/INR  Thank you for allowing [place] to participate in this patients care. Signe Colt, PharmD   08/18/2015,1:12 PM

## 2015-08-18 NOTE — Progress Notes (Signed)
    SUBJECTIVE: The patient is doing well this morning, oob to eat breakfast.  At this time, she denies chest pain, shortness of breath.  She mentions once yesterday upon standing felt lightheaded that resolved with sitting.  CURRENT MEDICATIONS: . antiseptic oral rinse  7 mL Mouth Rinse BID  . feeding supplement  1 Container Oral BID BM  . pantoprazole  40 mg Oral Q1200  . Warfarin - Pharmacist Dosing Inpatient   Does not apply q1800      OBJECTIVE: Physical Exam: Filed Vitals:   08/17/15 0418 08/17/15 1500 08/17/15 2153 08/18/15 0500  BP: 98/79 91/50 106/56 98/52  Pulse: 86 92 86 100  Temp: 98.3 F (36.8 C) 99.8 F (37.7 C) 98.6 F (37 C) 98.4 F (36.9 C)  TempSrc: Oral Oral Oral Oral  Resp: 17 16    Height:      Weight: 102 lb 11.8 oz (46.6 kg)   104 lb (47.174 kg)  SpO2: 99% 100% 98% 98%    Intake/Output Summary (Last 24 hours) at 08/18/15 0703 Last data filed at 08/18/15 0113  Gross per 24 hour  Intake      0 ml  Output    800 ml  Net   -800 ml    Telemetry reveals SR, infrequently with V pacing  GEN- The patient is thin, well appearing, alert and oriented x 3 today.   Head- normocephalic, atraumatic Eyes-  Sclera clear, conjunctiva pink Ears- hearing intact Oropharynx- clear Neck- supple  Lungs- Clear to ausculation bilaterally, normal work of breathing Heart- Regular rate and rhythm  GI- soft, NT, ND Extremities- no clubbing, cyanosis, or edema Skin- no rash or lesion, thoracotomy wound is dry, pacer site is dressed. Psych- euthymic mood, full affect Neuro- strength and sensation are intact  LABS: Basic Metabolic Panel:  Recent Labs  75/91/63 0538 08/18/15 0535  NA 131* 136  K 3.7 4.0  CL 96* 98*  CO2 29 28  GLUCOSE 98 95  BUN 17 13  CREATININE 0.49 0.50  CALCIUM 8.7* 9.0    ASSESSMENT AND PLAN:    1. Cardiac arrest EMS records describe both PEA as well as VT during arrest Normal coronaries by cath 07/08/15 s/p hypothermia  protocol MRI with diffuse LGE, EF 45% EF by echo 08/12/15 was 55-60% s/p ICD implant 08/15/15 by Dr. Elberta Fortis  2.  Paroxysmal atrial fibrillation Noted post arrest On Warfarin at least for the short term CHADS2VASC is 1 - likely can discontinue at follow up INR today is 1.18, continue as per pharmacy  3.  MR s/p MVR and ligation PDA, clipping LAA on 07/22/15 Stable  4. Generally weak     Prolonged hospitalization and bed rest     Described what sounds like episode of orthostasis yesterday, feels well this morning OOB to chair     Not on any antihypertensives     SBP 90's- low 100's     Follow with PT, likely to need rehab  Francis Dowse, PA-C 08/18/2015 7:03 AM Agree with above Pt interviwed and examined   Would try and avoid med for BP although her history of antecedent syncope is quite complicated and may have a vasomotor component

## 2015-08-19 ENCOUNTER — Encounter: Payer: Self-pay | Admitting: Physician Assistant

## 2015-08-19 DIAGNOSIS — I5033 Acute on chronic diastolic (congestive) heart failure: Secondary | ICD-10-CM | POA: Insufficient documentation

## 2015-08-19 DIAGNOSIS — I951 Orthostatic hypotension: Secondary | ICD-10-CM

## 2015-08-19 LAB — BASIC METABOLIC PANEL
ANION GAP: 8 (ref 5–15)
BUN: 8 mg/dL (ref 6–20)
CHLORIDE: 99 mmol/L — AB (ref 101–111)
CO2: 29 mmol/L (ref 22–32)
Calcium: 8.8 mg/dL — ABNORMAL LOW (ref 8.9–10.3)
Creatinine, Ser: 0.46 mg/dL (ref 0.44–1.00)
Glucose, Bld: 110 mg/dL — ABNORMAL HIGH (ref 65–99)
POTASSIUM: 3.5 mmol/L (ref 3.5–5.1)
SODIUM: 136 mmol/L (ref 135–145)

## 2015-08-19 LAB — PROTIME-INR
INR: 1.2 (ref 0.00–1.49)
PROTHROMBIN TIME: 15.3 s — AB (ref 11.6–15.2)

## 2015-08-19 MED ORDER — FLUDROCORTISONE ACETATE 0.1 MG PO TABS
0.1000 mg | ORAL_TABLET | Freq: Two times a day (BID) | ORAL | Status: DC
  Administered 2015-08-19 (×2): 0.1 mg via ORAL
  Filled 2015-08-19 (×4): qty 1

## 2015-08-19 MED ORDER — POTASSIUM CHLORIDE CRYS ER 20 MEQ PO TBCR
40.0000 meq | EXTENDED_RELEASE_TABLET | ORAL | Status: AC
  Administered 2015-08-19 (×2): 40 meq via ORAL
  Filled 2015-08-19 (×2): qty 2

## 2015-08-19 MED ORDER — MIDODRINE HCL 5 MG PO TABS: 5.0000 mg | ORAL_TABLET | ORAL | Status: DC

## 2015-08-19 MED ORDER — WARFARIN SODIUM 2 MG PO TABS
2.0000 mg | ORAL_TABLET | Freq: Once | ORAL | Status: AC
  Administered 2015-08-19: 2 mg via ORAL
  Filled 2015-08-19: qty 1

## 2015-08-19 NOTE — Progress Notes (Signed)
Prior to ambulation patient sitting on side of bed and blood pressure was 82/48.  Patient ambulated to bathroom with use of walker and tech assist x1.  When patient was walking back from bathroom, stood on scale to obtain weight.  As patient was on the scale she had a syncopal episode and tech placed patient back in bed.  Tech then immediately called for RN.  RN in room and patient laying in bed, with head of bed slightly elevated, awake and alert.  Blood pressure at that time 139/89.  RN asked patient if she was feeling dizzy or lightheaded now and patient replied no she was ok.

## 2015-08-19 NOTE — Progress Notes (Signed)
    SUBJECTIVE: The patient is doing well this morning, oob to eat breakfast.  At this time, she denies chest pain, shortness of breath.  SYNCOPE noted last pm  Low BP when upright and normal when flat  CURRENT MEDICATIONS: . antiseptic oral rinse  7 mL Mouth Rinse BID  . feeding supplement  1 Container Oral BID BM  . midodrine  2.5 mg Oral 3 times per day  . pantoprazole  40 mg Oral Q1200  . Warfarin - Pharmacist Dosing Inpatient   Does not apply q1800      OBJECTIVE: Physical Exam: Filed Vitals:   08/19/15 0439 08/19/15 0445 08/19/15 0500 08/19/15 0550  BP: 82/48 139/89 132/79 99/58  Pulse: 94     Temp: 97.9 F (36.6 C)     TempSrc: Oral     Resp: 16     Height:      Weight: 103 lb 4.8 oz (46.857 kg)     SpO2: 95%       Intake/Output Summary (Last 24 hours) at 08/19/15 0746 Last data filed at 08/19/15 0446  Gross per 24 hour  Intake    560 ml  Output   1200 ml  Net   -640 ml    Telemetry reveals SR, infrequently with V pacing  GEN- The patient is thin, well appearing, alert and oriented x 3 today.   Head- normocephalic, atraumatic Eyes-  Sclera clear, conjunctiva pink Ears- hearing intact Oropharynx- clear Neck- supple  Lungs- Clear to ausculation bilaterally, normal work of breathing Heart- Regular rate and rhythm  GI- soft, NT, ND Extremities- no clubbing, cyanosis, or edema Skin- no rash or lesion, thoracotomy wound is dry, pacer site is dressed. Psych- euthymic mood, full affect Neuro- strength and sensation are intact  LABS: Basic Metabolic Panel:  Recent Labs  18/33/58 0538 08/18/15 0535  NA 131* 136  K 3.7 4.0  CL 96* 98*  CO2 29 28  GLUCOSE 98 95  BUN 17 13  CREATININE 0.49 0.50  CALCIUM 8.7* 9.0    ASSESSMENT AND PLAN:    1. Cardiac arrest  2.  Paroxysmal atrial fibrillation    3.  MR s/p MVR and ligation PDA, clipping LAA on 07/22/15  4) infiltrative cardiomyopathy  5) orthostatic hypotension     Will Rx abd binder and add  florinef     Will need BMET in 1 week to look for hypokalemia  Reverse trendelenburg   Check orthostatics

## 2015-08-19 NOTE — Progress Notes (Signed)
ANTICOAGULATION CONSULT NOTE - Follow up Consult  Pharmacy Consult for coumadin Indication: atrial fibrillation  Allergies  Allergen Reactions  . Vicodin [Hydrocodone-Acetaminophen] Other (See Comments)    "she feels out of it", hallucinating  . Adhesive [Tape] Itching and Rash  . Silicone Itching and Rash    TAPE ALLERGY/EKG STICKER ALLERGY, Please use "paper" tape only    Patient Measurements: Height: 5\' 2"  (157.5 cm) Weight: 103 lb 4.8 oz (46.857 kg) IBW/kg (Calculated) : 50.1  Vital Signs: Temp: 98.3 F (36.8 C) (01/31 0752) Temp Source: Oral (01/31 0752) BP: 71/49 mmHg (01/31 0858) Pulse Rate: 93 (01/31 0752)  Labs:  Recent Labs  08/17/15 0538 08/18/15 0535 08/19/15 0715  LABPROT  --  15.2 15.3*  INR  --  1.18 1.20  CREATININE 0.49 0.50 0.46    Estimated Creatinine Clearance: 58.1 mL/min (by C-G formula based on Cr of 0.46).   Medical History: Past Medical History  Diagnosis Date  . Severe mitral regurgitation 07/03/2015  . Chronic combined systolic and diastolic CHF (congestive heart failure) (HCC) 07/03/2015    Grade 3 diastolic dysfunction.  LVEF 40-45% 05/2015.  Marland Kitchen Aortic aneurysm (HCC)   . Patent ductus arteriosus 07/16/2015  . Depression     SOME DEPRESSION (ON MEDS FOR 2 YRS) WHEN HER MOTHER PASSED  . 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 Pt was on warfarin PTA for MVR. Was on heparin during this admission initially. Heparin stopped prior to ICD implant 1/27. Per EP note, Resume warfarin for short-term. Discussed with Dr. Gwenlyn Perking, ok to start warfarin with no bridging.  PTA dose: 2mg  daily, INR 2.54 on admission. Restarted warfarin 1/29, INR 1.18>1.2  Goal of Therapy:  INR 2-3 Monitor platelets by  anticoagulation protocol: Yes   Plan:  Continue warfarin 2mg  po x 1 Daily PT/INR  Thank you for allowing [place] to participate in this patients care. Signe Colt, PharmD   08/19/2015,11:56 AM

## 2015-08-19 NOTE — Progress Notes (Signed)
Orthopedic Tech Progress Note Patient Details:  Diane [name] 08-06-1960 [id number]  Ortho Devices Type of Ortho Device: Abdominal binder Ortho Device/Splint Location: abdomen  Ortho Device/Splint Interventions: Freeman Caldron, Tameem Pullara 08/19/2015, 9:12 AM

## 2015-08-19 NOTE — Progress Notes (Signed)
Physical Therapy Treatment Patient Details Name: Diane [name] MRN: [id number] DOB: [birth year] Today's Date: 08/19/2015    History of Present Illness Underwent MVR in January 2017. 08/05/15 admitted for PEA/VT arrest, intubated, placed on hypothermic protocol. Thoracentesis 1/23. Extubated 1/24. Pacemaker/ICD implantation 1/27.     PT Comments    Pt mobility remains limited secondary to orthostatic hypotension.  Will inform supervising PT of change in recommendations.  Pt will benefit from Short term SNF stay at D/C.  Pt demonstrated increased activity tolerance as evident by increased gait distance and  Exercise tolerance.    Follow Up Recommendations  SNF (change in reccommendations based on frequent sycopal episodes during hospitalization. )     Equipment Recommendations  None recommended by PT    Recommendations for Other Services       Precautions / Restrictions Precautions Precautions: Sternal;ICD/Pacemaker Restrictions Weight Bearing Restrictions: No    Mobility  Bed Mobility Overal bed mobility:  (Pt recieved in recliner chair after sitting up for several hours.  )             General bed mobility comments: pt up in chair  Transfers Overall transfer level: Needs assistance Equipment used: Rolling walker (2 wheeled) (Pt educated on use for balance vs support to maintain sternal precautions.) Transfers: Sit to/from Stand Sit to Stand: Min guard Stand pivot transfers: Min assist       General transfer comment: no complaints of dizziness upon standing.  Pt demo's better carryover standing without use of UE to maintain precautions.    Ambulation/Gait Ambulation/Gait assistance: Min guard Ambulation Distance (Feet): 102 Feet Assistive device: Rolling walker (2 wheeled) Gait Pattern/deviations: Step-through pattern;Decreased step length - left;Decreased step length - right;Decreased weight shift to right;Decreased weight shift to left     General Gait Details:  Pt with no complaints of dizziness during gt training.  Pt appears more confident with RW and demonstrated improved balance with RW.  Pt reports dizziness at end of gait training and PTA requested for patient to sit, then recliner LEs and dizziness subsided.  Vitals obtained post gt training and BP 104/44.  RN informed.     Stairs            Wheelchair Mobility    Modified Rankin (Stroke Patients Only)       Balance Overall balance assessment: Needs assistance Sitting-balance support: No upper extremity supported Sitting balance-Leahy Scale: Good       Standing balance-Leahy Scale: Fair                      Cognition Arousal/Alertness: Awake/alert Behavior During Therapy: WFL for tasks assessed/performed Overall Cognitive Status: Within Functional Limits for tasks assessed Area of Impairment: Safety/judgement         Safety/Judgement: Decreased awareness of safety (requires re-education for following ICD precautions and sternal precautions.)   Problem Solving: Requires verbal cues General Comments: will further assess higher level cognition    Exercises General Exercises - Lower Extremity Ankle Circles/Pumps: AROM;10 reps Long Arc Quad: AROM;Seated;Both;15 reps Hip ABduction/ADduction: Seated;AROM;15 reps (pillow squeeze and resisted hip abd/add. ) Hip Flexion/Marching: AROM;Seated;Both;15 reps Other Exercises Other Exercises: encouraged BUE AROM below shoulder level and keeping arms in front of body to adhere to precautions.. 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  ) Patient left: in chair;with call bell/phone within reach     Time: 1107-1125 PT Time Calculation (min) (ACUTE ONLY): 18 min  Charges:  $Gait Training: 8-22 mins                    G Codes:      Florestine Avers 08/23/15, 12:21 PM  Joycelyn Rua, PTA pager [phone]

## 2015-08-19 NOTE — Progress Notes (Signed)
Occupational Therapy Evaluation Patient Details Name: Diane [name] MRN: [id number] DOB: 1961/07/05 Today's Date: 08/19/2015    History of Present Illness Underwent MVR in January 2017. 08/05/15 admitted for PEA/VT arrest, intubated, placed on hypothermic protocol. VDRF. PNA. Thoracentesis 1/23. Extubated 1/24. Pacemaker/ICD implantation 1/27.    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 Pt demonstrates a significant decline in functional ability to complete ADL and mobility due to deficits listed below. Pt is very motivated to return to PLOF. Pt will benefit from rehab at SNF to facilitate safe return home. Pt agreeable to SNF. Pt limited at this time due to difficulty maintaining BP. Sitting 81/48. Standing  (less than 1 min due to being symptomatic) 68/35. Pt states she feels faint when she stands. Unable to ambulate this am due to BP and being symptomatic. Discussed with nsg. Recommend trying TED hose to help with BP. Will follow acutely to address established goals.     Follow Up Recommendations  SNF;Supervision/Assistance - 24 hour    Equipment Recommendations  Tub/shower seat    Recommendations for Other Services       Precautions / Restrictions Precautions Precautions: Sternal;ICD/Pacemaker      Mobility Bed Mobility               General bed mobility comments: pt up in chair  Transfers Overall transfer level: Needs assistance Equipment used: 1 person hand held assist Transfers: Sit to/from Stand;Stand Pivot Transfers Sit to Stand: Min guard Stand pivot transfers: Min assist - due to feeling faint       General transfer comment: assistance due to feeling "faint"    Balance     Sitting balance-Leahy Scale: Good       Standing balance-Leahy Scale: Fair                              ADL Overall ADL's : Needs assistance/impaired     Grooming: Minimal assistance    Upper Body Bathing: Set up;Supervision/ safety;Sitting   Lower Body Bathing: Minimal assistance;Sit to/from stand   Upper Body Dressing : Minimal assistance;Sitting   Lower Body Dressing: Minimal assistance;Sit to/from stand   Toilet Transfer: Minimal assistance           Functional mobility during ADLs: Minimal assistance General ADL Comments: limited by poor endurance and BP issues. Discussed sternal precuations which have been reviewed in previous PT sessions. Pt appeared unclear about precautions pertaining to her ADL tasks.      Vision     Perception     Praxis      Pertinent Vitals/Pain Pain Assessment: No/denies pain  BP sitting 81/48; standing (less than 1 min) 68/35     Hand Dominance Right   Extremity/Trunk Assessment Upper Extremity Assessment Upper Extremity Assessment: Generalized weakness   Lower Extremity Assessment Lower Extremity Assessment: Generalized weakness   Cervical / Trunk Assessment Cervical / Trunk Assessment: Normal   Communication Communication Communication: No difficulties   Cognition Arousal/Alertness: Awake/alert Behavior During Therapy: WFL for tasks assessed/performed Overall Cognitive Status: No family/caregiver present to determine baseline cognitive functioning                 General Comments: will further assess higher level cognition. Concern over memory issues given interaction this am.   General Comments       Exercises Exercises: Other exercises Other Exercises Other Exercises: encouraged BUE  AROM below shoulder level and keeping arms in front of body to adhere to precautions.. Combination of shoulder FF, elbow flex/ext Other Exercises: encouraged marching while sitting   Shoulder Instructions      Home Living Family/patient expects to be discharged to:: Private residence Living Arrangements: Spouse/significant other Available Help at Discharge: Family;Available PRN/intermittently (husband works full  time) Type of Home: House Home Access: Stairs to enter [employer] of Steps: 3   Home Layout: Two level;Able to live on main level with bedroom/bathroom Alternate Level Stairs-Number of Steps: 12                        Prior Functioning/Environment Level of Independence: Independent        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    OT Diagnosis: Generalized weakness;Cognitive deficits   OT Problem List: Decreased strength;Decreased range of motion;Decreased activity tolerance;Decreased cognition;Decreased safety awareness;Decreased knowledge of precautions;Cardiopulmonary status limiting activity   OT Treatment/Interventions: Self-care/ADL training;Therapeutic exercise;Energy conservation;DME and/or AE instruction;Therapeutic activities;Patient/family education    OT Goals(Current goals can be found in the care plan section) Acute Rehab OT Goals Patient Stated Goal: to get back to dancing OT Goal Formulation: With patient Time For Goal Achievement: 09/02/15 Potential to Achieve Goals: Good  OT Frequency: Min 2X/week   Barriers to D/C: Decreased caregiver support (husband works during the day)          Co-evaluation              End of Session Nurse Communication: Mobility status;Other (comment) (BP)  Activity Tolerance: Treatment limited secondary to medical complications (Comment) (BP) Patient left: in chair;with call bell/phone within reach   Time: 1003-1028 OT Time Calculation (min): 25 min Charges:  OT General Charges $OT Visit: 1 Procedure OT Evaluation $OT Eval Moderate Complexity: 1 Procedure OT Treatments $Self Care/Home Management : 8-22 mins G-Codes:    Alondria Mousseau,HILLARY 09/05/15, 10:43 AM   Luisa Dago, OTR/L  [phone] September 05, 2015

## 2015-08-19 NOTE — Progress Notes (Signed)
Patient was sitting in chair requested to go to the bathroom.  Patient ambulated to the bathroom with front wheeled walker and RN assist.  Patient sat on toilet, thought was going to have bowel movement, just passed gas and urinated.   After patient urinated, ambulated to sink to wash hands and brush teeth.  After patient done brushing teeth blood pressure was checked while patient still standing at sink and blood pressure 75/46, MAP 53, heart rate 105.  Patient then ambulated back to chair.  During all activity listed in this note patient denied feelings of dizziness and lightheadedness.

## 2015-08-19 NOTE — Telephone Encounter (Signed)
Hospital records indicate pt still an inpatient.

## 2015-08-19 NOTE — Plan of Care (Signed)
Problem: Phase I Progression Outcomes Goal: Pain controlled with appropriate interventions Outcome: Not Applicable Date Met:  75/91/63 Patient has denied pain thus far this shift.

## 2015-08-19 NOTE — Plan of Care (Signed)
Problem: Phase I Progression Outcomes Goal: OOB with assistance Outcome: Completed/Met Date Met:  08/19/15 Patient using bedside commode and able to ambulate to the bathroom with staff assistance of one and walker.

## 2015-08-19 NOTE — Progress Notes (Signed)
[facility] PROGRESS NOTE  Mattison Golay UJW:[id number] DOB: 1960-08-09 DOA: 08/05/2015 PCP: Emeterio Reeve, MD  Brief summary 55 y.o. female who collapsed at home, record indicates unwitnessed, out of the sight of family 1 minute prior to being found and was given CPR by a family member until EMS arrived, who reportedly found her in VT and was cardioverted and intubated. Per record, she was found in her living room after being out of sight about 1 min. EMS suctioned 150 ml fluid and CPR for 7 min with return of pulse, Then en route lost pulse again. CPR and epi. Here in ER V tach X 3 with shocks to A fib. She was reported comatose upon arrival to ER and hypothermia protocol initiated. Patient was transferred to the floor on 1/26 and care passed to [facility]. Since then patient has had cardiac MRI and placement of dual chamber pacemaker/ICD. Patient with ongoing orthostatic hypotension preventing safe discharge to SNF for rehabilitation.   Assessment/Plan: 1-VDRF in setting of PEA: -extubated on 1/24 -no SOB and good O2 sat on RA -continue flutter valve/IS -overall stable. -S/P Cardiac MRI and dual chamber ICD implantation (given PEA and VT episode) -will need SNF for rehabilitation (as soon as orthostatic hypotension is better control).  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 -continue holding diuretics and any antihypertensive currently  -good O2 sat on RA -cardiology on board; will follow rec's  4-atrial fibrillation: S/P LAA clipping 07/22/15  -rate controlled at this time -will follow cardiology rec's -on coumadin per pharmacy short term -CHADsVASC score 0-1 -will might not need anticoagulation long term  5-hypokalemia -will  replete as needed -BMET intermittently to follow electrolytes -K 3.5 (1/31) -will check Mg level in am  6-transaminitis: in the setting of PEA -will monitor trend -CMET in am -no icterus  7-normocitic anemia: will monitor trend -no signs of overt bleeding currently -Hgb stable  8-GI prophylaxis: continue PPI  9-orthostatic hypotension -started on midodrine and florinef -also with abdominal binder as recommended by electrophysiology service  -continue PT -follow clinical response  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 Will follow cardiology rec's. Continue working with PT. Will need SNF for rehab. Ready for discharge most likely in 48 hours (adjusting medications for orthostatic hypotension)   Consultants:  PCCM  Cardiology service   Procedures and significant events: 1/17 Admitted for PEA/VT arrest, intubated, placed on hypothermic protocol  1/17 CTA chest with no PO, moderate bilateral pleural effusions and consolidative changes of lower lobes 1/17 Echo with normal EF, mild LVH, mild LAW, trace AI, mild MS, no MR, small pericardial effusion, left pleural effusion 1/18 EEG with generalized slowing  1/19 Rewarmed, paralytics, sedation turned off 1/19 CT head normal 1/20 CXR with left sided pleural effusion and b/l pulmonary edema  1/20 INR 9.4 requiring FFP and vitamin K 1/20 IV amio stopped  1/20 EEG with generalized slowing  1/21 Improved mental status, start IV heparin  1/22 Vomited tube feeds, KUB normal  1/23 Thoracentesis of large left effusion with 950 cc semi-hemorrhagic fluid. IV heparin stopped. 1/23 2D-echo with EF  55-60% MV with no significant regurgitation  1/24-  extubated 1/27 pacemaker/ICd implantation   Antibiotics:  Unasyn 1/18>>>1/24   Cefazolin prophylaxis for pacemaker/ICD placement on 1/27  HPI/Subjective: Afebrile, no CP, no SOB. Hoarse from intubation (but improved). Struggling with weakness/deconditioning and also ongoing orthostatic hypotension   Objective: Filed Vitals:   08/19/15 1328 08/19/15 1702  BP: 103/70   Pulse:  96  Temp:  98.3 F (36.8 C)  Resp:      Intake/Output Summary (Last 24 hours) at 08/19/15 1809 Last data filed at 08/19/15 1758  Gross per 24 hour  Intake   1000 ml  Output    750 ml  Net    250 ml   Filed Weights   08/17/15 0418 08/18/15 0500 08/19/15 0439  Weight: 46.6 kg (102 lb 11.8 oz) 47.174 kg (104 lb) 46.857 kg (103 lb 4.8 oz)    Exam:   General:  Afebrile; denies CP and SOB. Patient feeling weak and deconditioned. Continue experiencing episodes of orthostatic changes and has near syncope event overnight when transferring to the bathroom. AAOX3 and otherwise in no distress.  Cardiovascular: rate controlled, no rubs or gallops; positive SEM. Good healing mid chest incision.  Respiratory: CTA bilaterally  Abdomen: soft, NT, ND; positive BS  Musculoskeletal: no edema, no cyanosis   Data Reviewed: Basic Metabolic Panel:  Recent Labs Lab 08/13/15 0530  08/14/15 0520 08/15/15 0515 08/16/15 0419 08/17/15 0538 08/18/15 0535 08/19/15 0715  NA 134*  < > 134* 139 134* 131* 136 136  K 3.0*  < > 3.3* 4.2 3.6 3.7 4.0 3.5  CL 86*  < > 88* 106 97* 96* 98* 99*  CO2 32  < > 34* 23 31 29 28 29   GLUCOSE 104*  < > 110* 131* 102* 98 95 110*  BUN 29*  < > 30* 5* 17 17 13 8   CREATININE 0.73  < > 0.65 0.75 0.42* 0.49 0.50 0.46  CALCIUM 8.8*  < > 8.9 9.7 9.0 8.7* 9.0 8.8*  MG 2.6*  --  2.5*  --   --   --   --   --   PHOS 4.2  --  3.0  --   --   --   --   --   < > = values in this interval not displayed. Liver Function Tests:  Recent Labs Lab 08/13/15 0530 08/14/15 0520  AST 68* 50*  ALT  181* 131*  ALKPHOS 97 92  BILITOT 1.4* 1.1  PROT 6.9 6.5  ALBUMIN 2.9* 3.0*   CBC:  Recent Labs Lab 08/13/15 0530 08/14/15 0520 08/15/15 0515  WBC 7.2 10.9* 12.2*  HGB 10.5* 10.3* 13.4  HCT 31.6* 31.9* 42.0  MCV 93.2 93.8 95.7  PLT 303 298 216   BNP (last 3 results)  Recent Labs  11/21/14 2324  BNP 196.6*   CBG:  Recent Labs Lab 08/12/15 2018 08/13/15 0043 08/13/15 0533 08/13/15 0848  GLUCAP 113* 101* 98 108*    Recent Results (from the past 240 hour(s))  Body fluid culture     Status: None   Collection Time: 08/11/15  1:08 PM  Result Value Ref Range Status   Specimen Description FLUID LEFT PLEURAL  Final   Special Requests Normal  Final   Gram Stain   Final    FEW WBC PRESENT,BOTH PMN AND MONONUCLEAR NO ORGANISMS SEEN    Culture NO GROWTH 3 DAYS  Final   Report Status 08/14/2015 FINAL  Final  Fungus Culture with  Smear     Status: None (Preliminary result)   Collection Time: 08/11/15  1:08 PM  Result Value Ref Range Status   Specimen Description FLUID PLEURAL LEFT  Final   Special Requests NONE  Final   Fungal Smear   Final    NO YEAST OR FUNGAL ELEMENTS SEEN Performed at [facility]    Culture   Final    CULTURE IN PROGRESS FOR FOUR WEEKS Performed at [facility]    Report Status PENDING  Incomplete     Studies: No results found.  Scheduled Meds: . antiseptic oral rinse  7 mL Mouth Rinse BID  . feeding supplement  1 Container Oral BID BM  . fludrocortisone  0.1 mg Oral BID  . midodrine  2.5 mg Oral 3 times per day  . pantoprazole  40 mg Oral Q1200  . 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 If 7PM-7AM, please contact night-coverage at [email or web address], password [facility] 08/19/2015, 6:09 PM  LOS: 14 days

## 2015-08-19 NOTE — Progress Notes (Signed)
SNF auth rec'd for patient to go to [facility]. I have updated MD who reports patient is not stable for dc and will likely not be ready for a few days- SNF updated who will report back to [employer] to hold the auth (10days approved) until ready.  Reece Levy, MSW, Theresia Majors  [phone]

## 2015-08-19 NOTE — Progress Notes (Signed)
Patient orthostatic vitals this am before the abdominal binder was applied are as follows.. 108/66 lying , 98/61 sitting, standing 71/49. Patient was unable to do the three minute standing BP.   Patient orthostatic vitals WITH the abdominal binder are as follows.. 106/62 lying, 102/57 sitting, 77/38 standing, 66/28 at three minute standing blood pressure. Patient was light headed, and states she had some pain in her legs and her left arm at this time, which resolved after laying back in bed.   MD made aware, will continue to monitor.

## 2015-08-20 LAB — COMPREHENSIVE METABOLIC PANEL
ALBUMIN: 3 g/dL — AB (ref 3.5–5.0)
ALK PHOS: 104 U/L (ref 38–126)
ALT: 34 U/L (ref 14–54)
AST: 29 U/L (ref 15–41)
Anion gap: 7 (ref 5–15)
BUN: 8 mg/dL (ref 6–20)
CALCIUM: 8.8 mg/dL — AB (ref 8.9–10.3)
CHLORIDE: 104 mmol/L (ref 101–111)
CO2: 26 mmol/L (ref 22–32)
CREATININE: 0.43 mg/dL — AB (ref 0.44–1.00)
GFR calc Af Amer: >60 mL/min (ref >60)
GFR calc non Af Amer: >60 mL/min (ref >60)
GLUCOSE: 107 mg/dL — AB (ref 65–99)
Potassium: 4.4 mmol/L (ref 3.5–5.1)
SODIUM: 137 mmol/L (ref 135–145)
Total Bilirubin: 0.8 mg/dL (ref 0.3–1.2)
Total Protein: 6.3 g/dL — ABNORMAL LOW (ref 6.5–8.1)

## 2015-08-20 LAB — MAGNESIUM: Magnesium: 2.1 mg/dL (ref 1.7–2.4)

## 2015-08-20 LAB — ANA, BODY FLUID: Anti-Nuclear Ab, IgG: NOT DETECTED

## 2015-08-20 LAB — PROTIME-INR
INR: 1.42 (ref 0.00–1.49)
Prothrombin Time: 17.4 seconds — ABNORMAL HIGH (ref 11.6–15.2)

## 2015-08-20 MED ORDER — MIDODRINE HCL 5 MG PO TABS
5.0000 mg | ORAL_TABLET | ORAL | Status: DC
  Administered 2015-08-20 – 2015-08-21 (×8): 5 mg via ORAL
  Filled 2015-08-20 (×9): qty 1

## 2015-08-20 MED ORDER — WARFARIN SODIUM 2 MG PO TABS
2.0000 mg | ORAL_TABLET | Freq: Every day | ORAL | Status: AC
  Administered 2015-08-20: 2 mg via ORAL
  Filled 2015-08-20: qty 1

## 2015-08-20 MED ORDER — FLUDROCORTISONE ACETATE 0.1 MG PO TABS
0.2000 mg | ORAL_TABLET | Freq: Two times a day (BID) | ORAL | Status: DC
  Administered 2015-08-20 – 2015-08-23 (×7): 0.2 mg via ORAL
  Filled 2015-08-20 (×8): qty 2

## 2015-08-20 NOTE — Plan of Care (Signed)
Problem: Phase I Progression Outcomes Goal: Transport with an RN & AED (pre-op) Outcome: Not Applicable Date Met:  68/59/92 Patient is post op ICD implantation.

## 2015-08-20 NOTE — Progress Notes (Signed)
    SUBJECTIVE: The patient is doing well this morning, oob to eat breakfast.  At this time, she denies chest pain, shortness of breath.  SYNCOPE noted last pm     Profoud orthostasis  CURRENT MEDICATIONS: . antiseptic oral rinse  7 mL Mouth Rinse BID  . feeding supplement  1 Container Oral BID BM  . fludrocortisone  0.1 mg Oral BID  . midodrine  5 mg Oral 3 times per day  . pantoprazole  40 mg Oral Q1200  . Warfarin - Pharmacist Dosing Inpatient   Does not apply q1800      OBJECTIVE: Physical Exam: Filed Vitals:   08/19/15 1328 08/19/15 1702 08/19/15 1954 08/20/15 0020  BP: 103/70  84/49 97/52  Pulse:  96 101 96  Temp:  98.3 F (36.8 C) 97.4 F (36.3 C) 97.9 F (36.6 C)  TempSrc:  Oral Oral Oral  Resp:   12 16  Height:      Weight:      SpO2:  100% 100% 96%    Intake/Output Summary (Last 24 hours) at 08/20/15 0719 Last data filed at 08/20/15 0230  Gross per 24 hour  Intake    920 ml  Output    900 ml  Net     20 ml    Telemetry reveals SR, infrequently with V pacing  GEN- The patient is thin, well appearing, alert and oriented x 3 today.   Head- normocephalic, atraumatic Eyes-  Sclera clear, conjunctiva pink Ears- hearing intact Oropharynx- clear Neck- supple  Lungs- Clear to ausculation bilaterally, normal work of breathing Heart- Regular rate and rhythm  GI- soft, NT, ND Extremities- no clubbing, cyanosis, or edema Skin- no rash or lesion, thoracotomy wound is dry, pacer site is dressed. Psych- euthymic mood, full affect Neuro- strength and sensation are intact  LABS: Basic Metabolic Panel:  Recent Labs  53/61/44 0715 08/20/15 0540  NA 136 137  K 3.5 4.4  CL 99* 104  CO2 29 26  GLUCOSE 110* 107*  BUN 8 8  CREATININE 0.46 0.43*  CALCIUM 8.8* 8.8*  MG  --  2.1    ASSESSMENT AND PLAN:    1. Cardiac arrest  2.  Paroxysmal atrial fibrillation    3.  MR s/p MVR and ligation PDA, clipping LAA on 07/22/15  4) infiltrative  cardiomyopathy  5) orthostatic hypotension      Continue med Rx for orthostasis  Increase florinef and promatine  The lack of HR response raises spectre of autonomic process but the absence of orthostasis during eval for syncope makes that unlikely  Still it is currently limited

## 2015-08-20 NOTE — Progress Notes (Signed)
Patient up to bedside commode with contact guard assist of one.  Patient sat on side of bed for a few minutes before going to bedside commode.  Between side of bed and beside commode patient sitting for about ten minutes.  After five minutes of sitting blood pressure checked while patient was on bedside commode and was 79/53, MAP 59, heart rate 99.  During all activity in this note patient stated she felt ok, denied feeling lightheaded and/or dizzy.

## 2015-08-20 NOTE — Progress Notes (Signed)
[facility] PROGRESS NOTE  Diane [name] ZOX:[id number] DOB: 04/18/1961 DOA: 08/05/2015 PCP: Emeterio Reeve, MD  Brief summary 55 y.o. female who collapsed at home, record indicates unwitnessed, out of the sight of family 1 minute prior to being found and was given CPR by a family member until EMS arrived, who reportedly found her in VT and was cardioverted and intubated. Per record, she was found in her living room after being out of sight about 1 min. EMS suctioned 150 ml fluid and CPR for 7 min with return of pulse, Then en route lost pulse again. CPR and epi. Here in ER V tach X 3 with shocks to A fib. She was reported comatose upon arrival to ER and hypothermia protocol initiated. Patient was transferred to the floor on 1/26 and care passed to [facility]. Since then patient has had cardiac MRI and placement of dual chamber pacemaker/ICD. Patient with ongoing orthostatic hypotension preventing safe discharge to SNF for rehabilitation.   Assessment/Plan: 1-VDRF in setting of asystole, VT, PEA: -extubated on 1/24 Resolved. 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 -continue holding diuretics and any antihypertensive currently   Orthostatic hypothension, syncope. Increasing florinef and midodrine. Has abdominal binder. BP too low for discharge  4-atrial fibrillation: S/P LAA clipping 07/22/15  -rate controlled at this time -will follow cardiology rec's -on coumadin per pharmacy short term: per EP note, likely d/c at f/u [facility] score 0-1  Pleural effusion: s/p left thoracentesis and steroids for dresslers  5-hypokalemia Corrected. Mag ok.  6-transaminitis: resolved  7-normocitic anemia: will monitor trend -no signs of overt bleeding currently Check h/h in am  Code Status: Full Family  Communication: significant other Disposition Plan: eventual SNF   Consultants:  PCCM  Cardiology service   neurology  Procedures and significant events: 1/17 Admitted for PEA/VT arrest, intubated, placed on hypothermic protocol  1/17 CTA chest with no PO, moderate bilateral pleural effusions and consolidative changes of lower lobes 1/17 Echo with normal EF, mild LVH, mild LAW, trace AI, mild MS, no MR, small pericardial effusion, left pleural effusion 1/18 EEG with generalized slowing  1/19 Rewarmed, paralytics, sedation turned off 1/19 CT head normal 1/20 CXR with left sided pleural effusion and b/l pulmonary edema  1/20 INR 9.4 requiring FFP and vitamin K 1/20 IV amio stopped  1/20 EEG with generalized slowing  1/21 Improved mental status, start IV heparin  1/22 Vomited tube feeds, KUB normal  1/23 Thoracentesis of large left effusion with 950 cc semi-hemorrhagic fluid. IV heparin stopped. 1/23 2D-echo with EF 55-60% MV with no significant regurgitation  1/24- extubated 1/27 pacemaker/ICd implantation   Antibiotics:  Unasyn 1/18>>>1/24   Cefazolin prophylaxis for pacemaker/ICD placement on 1/27  HPI/Subjective: Tired of being in hospital. No dyspnea. Ambulated today.  Objective: Filed Vitals:   08/20/15 0020 08/20/15 0400  BP: 97/52 75/43  Pulse: 96 91  Temp: 97.9 F (36.6 C) 98.5 F (36.9 C)  Resp: 16 14    Intake/Output Summary (Last 24 hours) at 08/20/15 1258 Last data filed at 08/20/15 0800  Gross per 24 hour  Intake   1080 ml  Output   1400 ml  Net   -320 ml   Filed Weights   08/18/15 0500 08/19/15 0439 08/20/15 0755  Weight: 47.174 kg (104 lb) 46.857 kg (103 lb 4.8 oz) 48.263 kg (106 lb 6.4 oz)  Exam:   General:  A and o. Weak appearing  Cardiovascular: RRR without MGR.  Respiratory: CTA bilaterally without WRR  Abdomen: soft, NT, ND; positive BS  Musculoskeletal: no edema, no cyanosis   Data Reviewed: Basic Metabolic  Panel:  Recent Labs Lab 08/14/15 0520  08/16/15 0419 08/17/15 0538 08/18/15 0535 08/19/15 0715 08/20/15 0540  NA 134*  < > 134* 131* 136 136 137  K 3.3*  < > 3.6 3.7 4.0 3.5 4.4  CL 88*  < > 97* 96* 98* 99* 104  CO2 34*  < > 31 29 28 29 26   GLUCOSE 110*  < > 102* 98 95 110* 107*  BUN 30*  < > 17 17 13 8 8   CREATININE 0.65  < > 0.42* 0.49 0.50 0.46 0.43*  CALCIUM 8.9  < > 9.0 8.7* 9.0 8.8* 8.8*  MG 2.5*  --   --   --   --   --  2.1  PHOS 3.0  --   --   --   --   --   --   < > = values in this interval not displayed. Liver Function Tests:  Recent Labs Lab 08/14/15 0520 08/20/15 0540  AST 50* 29  ALT 131* 34  ALKPHOS 92 104  BILITOT 1.1 0.8  PROT 6.5 6.3*  ALBUMIN 3.0* 3.0*   CBC:  Recent Labs Lab 08/14/15 0520 08/15/15 0515  WBC 10.9* 12.2*  HGB 10.3* 13.4  HCT 31.9* 42.0  MCV 93.8 95.7  PLT 298 216   BNP (last 3 results)  Recent Labs  11/21/14 2324  BNP 196.6*   CBG: No results for input(s): GLUCAP in the last 168 hours.  Recent Results (from the past 240 hour(s))  Body fluid culture     Status: None   Collection Time: 08/11/15  1:08 PM  Result Value Ref Range Status   Specimen Description FLUID LEFT PLEURAL  Final   Special Requests Normal  Final   Gram Stain   Final    FEW WBC PRESENT,BOTH PMN AND MONONUCLEAR NO ORGANISMS SEEN    Culture NO GROWTH 3 DAYS  Final   Report Status 08/14/2015 FINAL  Final  Fungus Culture with Smear     Status: None (Preliminary result)   Collection Time: 08/11/15  1:08 PM  Result Value Ref Range Status   Specimen Description FLUID PLEURAL LEFT  Final   Special Requests NONE  Final   Fungal Smear   Final    NO YEAST OR FUNGAL ELEMENTS SEEN Performed at [facility]    Culture   Final    CULTURE IN PROGRESS FOR FOUR WEEKS Performed at [facility]    Report Status PENDING  Incomplete     Studies: No results found.  Scheduled Meds: . antiseptic oral rinse  7 mL Mouth Rinse BID  .  feeding supplement  1 Container Oral BID BM  . fludrocortisone  0.2 mg Oral BID  . midodrine  5 mg Oral 4 times per day on Wed Thu Fri Sat  . pantoprazole  40 mg Oral Q1200  . Warfarin - Pharmacist Dosing Inpatient   Does not apply q1800   Continuous Infusions:    Principal Problem:   Asystole (HCC) Active Problems:   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   Long term (current) use of anticoagulants [Z79.01]   Cardiac arrest (HCC)   Respiratory arrest (HCC)   Cardiogenic shock (HCC)   Central  line insertion site infection   Congestive heart failure (HCC)   Hypokalemia   Long QT interval   Valvular cardiomyopathy (HCC)   Atrial fibrillation (HCC)   Paroxysmal ventricular tachycardia (HCC)   Orthostatic hypotension   Acute on chronic diastolic heart failure (HCC)    Time spent: 25 minutes    Daimen Shovlin L  [facility] [email or web address], password [facility] 08/20/2015, 12:58 PM  LOS: 15 days

## 2015-08-20 NOTE — Progress Notes (Signed)
Nutrition Follow-up  DOCUMENTATION CODES:   Not applicable  INTERVENTION:    Continue Boost Breeze po TID, each supplement provides 250 kcal and 9 grams of protein  NUTRITION DIAGNOSIS:   Inadequate oral intake related to poor appetite as evidenced by per patient/family report.  Ongoing  GOAL:   Patient will meet greater than or equal to 90% of their needs  Met with PO intake of meals and supplements  MONITOR:   PO intake, Supplement acceptance, I & O's, Labs  ASSESSMENT:   55 y.o. female w/ PMHx of severe MR s/p repair 07/22/15, NICM, chronic combined CHF, presented to [facility] ED after cardiac arrest.  Patient reports that she has been eating a little better. She feels like she has more energy today. She is only able to eat 2 meals per day (breakfast and supper). She is too full to eat any more and food just seems to sit in her stomach with abdominal binder. She has been drinking 1-2 Boost Breeze supplements per day. She ate 80% of breakfast today per RN. Her friend has been bringing in some foods that she usually eats at home, but patient does not feel like eating them either. She does not like the Boost Breeze supplements, but is aware that she needs to consume more calories and protein, so she is willing to continue to drink the supplements.   Patient continues to have orthostatic hypotension.   Diet Order:  Diet Heart Room service appropriate?: Yes; Fluid consistency:: Thin  Skin:  Reviewed, no issues  Last BM:  1/31  Height:   Ht Readings from Last 1 Encounters:  08/09/15 '5\' 2"'  (1.575 m)    Weight:   Wt Readings from Last 1 Encounters:  08/20/15 106 lb 6.4 oz (48.263 kg)   08/12/15 115 lb 8.3 oz (52.4 kg)         Ideal Body Weight:  50 kg  BMI:  Body mass index is 19.46 kg/(m^2).  Estimated Nutritional Needs:   Kcal:  1500-1700  Protein:  75-90 grams  Fluid:  > 1.5 L/day  EDUCATION NEEDS:   No education needs identified at this time  Molli Barrows, [place], [place] Pager [phone] After Hours Pager [phone]

## 2015-08-20 NOTE — Progress Notes (Signed)
ANTICOAGULATION CONSULT NOTE - Follow up Consult  Pharmacy Consult for coumadin Indication: atrial fibrillation  Allergies  Allergen Reactions  . Vicodin [Hydrocodone-Acetaminophen] Other (See Comments)    "she feels out of it", hallucinating  . Adhesive [Tape] Itching and Rash  . Silicone Itching and Rash    TAPE ALLERGY/EKG STICKER ALLERGY, Please use "paper" tape only    Patient Measurements: Height: 5\' 2"  (157.5 cm) Weight: 106 lb 6.4 oz (48.263 kg) IBW/kg (Calculated) : 50.1  Vital Signs: Temp: 98.5 F (36.9 C) (02/01 0400) Temp Source: Oral (02/01 0400) BP: 75/43 mmHg (02/01 0400) Pulse Rate: 91 (02/01 0400)  Labs:  Recent Labs  08/18/15 0535 08/19/15 0715 08/20/15 0540  LABPROT 15.2 15.3* 17.4*  INR 1.18 1.20 1.42  CREATININE 0.50 0.46 0.43*    Estimated Creatinine Clearance: 59.9 mL/min (by C-G formula based on Cr of 0.43).   Medical History: Past Medical History  Diagnosis Date  . Severe mitral regurgitation 07/03/2015  . Chronic combined systolic and diastolic CHF (congestive heart failure) (HCC) 07/03/2015    Grade 3 diastolic dysfunction.  LVEF 40-45% 05/2015.  Marland Kitchen Aortic aneurysm (HCC)   . Patent ductus arteriosus 07/16/2015  . Depression     SOME DEPRESSION (ON MEDS FOR 2 YRS) WHEN HER MOTHER PASSED  . 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 Pt was on warfarin PTA for MVR. Was on heparin during this admission initially. Heparin stopped prior to ICD implant 1/27. Per EP note, Resume warfarin for short-term. Discussed with Dr. Gwenlyn Perking, ok to start warfarin with no bridging.  PTA dose: warfarin 2mg  daily Restarted warfarin 1/29, INR 1.18>1.2>1.4  Goal of Therapy:  INR 2-3 Monitor platelets by anticoagulation  protocol: Yes   Plan:  Continue warfarin 2mg  po daily Daily PT/INR  Thank you for allowing [place] to participate in this patients care. Signe Colt, PharmD   08/20/2015,2:15 PM

## 2015-08-21 DIAGNOSIS — R55 Syncope and collapse: Secondary | ICD-10-CM

## 2015-08-21 LAB — CBC
HEMATOCRIT: 32.9 % — AB (ref 36.0–46.0)
Hemoglobin: 10.7 g/dL — ABNORMAL LOW (ref 12.0–15.0)
MCH: 30.7 pg (ref 26.0–34.0)
MCHC: 32.5 g/dL (ref 30.0–36.0)
MCV: 94.5 fL (ref 78.0–100.0)
PLATELETS: 293 10*3/uL (ref 150–400)
RBC: 3.48 MIL/uL — ABNORMAL LOW (ref 3.87–5.11)
RDW: 14.9 % (ref 11.5–15.5)
WBC: 8.3 10*3/uL (ref 4.0–10.5)

## 2015-08-21 LAB — BASIC METABOLIC PANEL
Anion gap: 8 (ref 5–15)
BUN: 6 mg/dL (ref 6–20)
CO2: 26 mmol/L (ref 22–32)
Calcium: 9 mg/dL (ref 8.9–10.3)
Chloride: 102 mmol/L (ref 101–111)
Creatinine, Ser: 0.43 mg/dL — ABNORMAL LOW (ref 0.44–1.00)
GFR calc Af Amer: >60 mL/min (ref >60)
GLUCOSE: 104 mg/dL — AB (ref 65–99)
POTASSIUM: 3.8 mmol/L (ref 3.5–5.1)
Sodium: 136 mmol/L (ref 135–145)

## 2015-08-21 LAB — PROTIME-INR
INR: 1.46 (ref 0.00–1.49)
Prothrombin Time: 17.8 seconds — ABNORMAL HIGH (ref 11.6–15.2)

## 2015-08-21 MED ORDER — WARFARIN SODIUM 2 MG PO TABS
3.0000 mg | ORAL_TABLET | Freq: Once | ORAL | Status: AC
  Administered 2015-08-21: 3 mg via ORAL
  Filled 2015-08-21: qty 1

## 2015-08-21 NOTE — Progress Notes (Signed)
ANTICOAGULATION CONSULT NOTE - Follow up Consult  Pharmacy Consult for coumadin Indication: atrial fibrillation  Allergies  Allergen Reactions  . Vicodin [Hydrocodone-Acetaminophen] Other (See Comments)    "she feels out of it", hallucinating  . Adhesive [Tape] Itching and Rash  . Silicone Itching and Rash    TAPE ALLERGY/EKG STICKER ALLERGY, Please use "paper" tape only    Patient Measurements: Height: 5\' 2"  (157.5 cm) Weight: 106 lb 9.6 oz (48.353 kg) IBW/kg (Calculated) : 50.1  Vital Signs: Temp: 97.8 F (36.6 C) (02/02 0455) Temp Source: Oral (02/02 0455) BP: 93/48 mmHg (02/02 0455) Pulse Rate: 90 (02/02 0455)  Labs:  Recent Labs  08/19/15 0715 08/20/15 0540 08/21/15 0300  HGB  --   --  10.7*  HCT  --   --  32.9*  PLT  --   --  293  LABPROT 15.3* 17.4* 17.8*  INR 1.20 1.42 1.46  CREATININE 0.46 0.43* 0.43*    Estimated Creatinine Clearance: 60 mL/min (by C-G formula based on Cr of 0.43).   Medical History: Past Medical History  Diagnosis Date  . Severe mitral regurgitation 07/03/2015  . Chronic combined systolic and diastolic CHF (congestive heart failure) (HCC) 07/03/2015    Grade 3 diastolic dysfunction.  LVEF 40-45% 05/2015.  Marland Kitchen Aortic aneurysm (HCC)   . Patent ductus arteriosus 07/16/2015  . Depression     SOME DEPRESSION (ON MEDS FOR 2 YRS) WHEN HER MOTHER PASSED  . 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 Pt was on warfarin PTA for MVR. Was on heparin during this admission initially. Heparin stopped prior to ICD implant 1/27. Per EP note, Resume warfarin for short-term. Per Dr. Gwenlyn Perking, ok to start warfarin with no bridging. MVR, ligation PDA, LAA clipping on 07/22/15  PTA dose: warfarin 2mg  daily Restarted warfarin  1/29, INR 1.46  Goal of Therapy:  INR 2-3 Monitor platelets by anticoagulation protocol: Yes   Plan:  Increase warfarin dose to 3mg  x 1, and reassess tomorrow Daily PT/INR  Thank you for allowing [place] to participate in this patients care. Signe Colt, PharmD   08/21/2015,10:31 AM

## 2015-08-21 NOTE — Progress Notes (Signed)
[facility] PROGRESS NOTE  Shallon Yaklin ZOX:[id number] DOB: 07-Aug-1960 DOA: 08/05/2015 PCP: Emeterio Reeve, MD  Brief summary 55 y.o. female who collapsed at home, record indicates unwitnessed, out of the sight of family 1 minute prior to being found and was given CPR by a family member until EMS arrived, who reportedly found her in [place] and was cardioverted and intubated. Per record, she was found in her living room after being out of sight about 1 min. EMS suctioned 150 ml fluid and CPR for 7 min with return of pulse, Then en route lost pulse again. CPR and epi. Here in ER V tach X 3 with shocks to A fib. She was reported comatose upon arrival to ER and hypothermia protocol initiated. Patient was transferred to the floor on 1/26 and care passed to [facility]. Since then patient has had cardiac MRI and placement of dual chamber pacemaker/ICD.    Assessment/Plan: 1-VDRF in setting of asystole, VT, PEA: -extubated on 1/24 Resolved. 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 -continue holding diuretics and any antihypertensive currently   Orthostatic hypothension, syncope.improving. Discharge to SNF per EP recs  4-atrial fibrillation: S/P LAA clipping 07/22/15  -rate controlled at this time -will follow cardiology rec's -on coumadin per pharmacy short term: per EP note, likely d/c at f/u [facility] score 0-1  Pleural effusion: s/p left thoracentesis and steroids for dresslers  5-hypokalemia Corrected. Mag ok.  6-transaminitis: resolved  7-normocitic anemia: will monitor trend -no signs of overt bleeding currently Check h/h in am  Code Status: Full Family Communication: significant other Disposition Plan: eventual SNF   Consultants:  PCCM  Cardiology service   neurology  Procedures and  significant events: 1/17 Admitted for PEA/VT arrest, intubated, placed on hypothermic protocol  1/17 CTA chest with no PO, moderate bilateral pleural effusions and consolidative changes of lower lobes 1/17 Echo with normal EF, mild LVH, mild LAW, trace AI, mild MS, no MR, small pericardial effusion, left pleural effusion 1/18 EEG with generalized slowing  1/19 Rewarmed, paralytics, sedation turned off 1/19 CT head normal 1/20 CXR with left sided pleural effusion and b/l pulmonary edema  1/20 INR 9.4 requiring FFP and vitamin K 1/20 IV amio stopped  1/20 EEG with generalized slowing  1/21 Improved mental status, start IV heparin  1/22 Vomited tube feeds, KUB normal  1/23 Thoracentesis of large left effusion with 950 cc semi-hemorrhagic fluid. IV heparin stopped. 1/23 2D-echo with EF 55-60% MV with no significant regurgitation  1/24- extubated 1/27 pacemaker/ICd implantation   Antibiotics:  Unasyn 1/18>>>1/24   Cefazolin prophylaxis for pacemaker/ICD placement on 1/27  HPI/Subjective: No dizziness, syncope, dyspnea.  Objective: Filed Vitals:   08/20/15 2336 08/21/15 0455  BP: 97/48 93/48  Pulse: 96 90  Temp: 98.3 F (36.8 C) 97.8 F (36.6 C)  Resp: 18 16    Intake/Output Summary (Last 24 hours) at 08/21/15 1332 Last data filed at 08/21/15 0500  Gross per 24 hour  Intake    500 ml  Output    525 ml  Net    -25 ml   Filed Weights   08/19/15 0439 08/20/15 0755 08/21/15 0501  Weight: 46.857 kg (103 lb 4.8 oz) 48.263 kg (106 lb 6.4 oz) 48.353 kg (106 lb 9.6 oz)    Exam:   General:  Asleep in chair. arousable  Cardiovascular: RRR without MGR.  Respiratory: CTA bilaterally without WRR  Abdomen: soft, NT, ND; positive BS  Musculoskeletal: no edema, no cyanosis   Data Reviewed: Basic Metabolic Panel:  Recent Labs Lab 08/17/15 0538 08/18/15 0535 08/19/15 0715 08/20/15 0540 08/21/15 0300  NA 131* 136 136 137 136  K 3.7 4.0 3.5 4.4 3.8  CL 96* 98*  99* 104 102  CO2 29 28 29 26 26   GLUCOSE 98 95 110* 107* 104*  BUN 17 13 8 8 6   CREATININE 0.49 0.50 0.46 0.43* 0.43*  CALCIUM 8.7* 9.0 8.8* 8.8* 9.0  MG  --   --   --  2.1  --    Liver Function Tests:  Recent Labs Lab 08/20/15 0540  AST 29  ALT 34  ALKPHOS 104  BILITOT 0.8  PROT 6.3*  ALBUMIN 3.0*   CBC:  Recent Labs Lab 08/15/15 0515 08/21/15 0300  WBC 12.2* 8.3  HGB 13.4 10.7*  HCT 42.0 32.9*  MCV 95.7 94.5  PLT 216 293   BNP (last 3 results)  Recent Labs  11/21/14 2324  BNP 196.6*   CBG: No results for input(s): GLUCAP in the last 168 hours.  No results found for this or any previous visit (from the past 240 hour(s)).   Studies: No results found.  Scheduled Meds: . antiseptic oral rinse  7 mL Mouth Rinse BID  . feeding supplement  1 Container Oral BID BM  . fludrocortisone  0.2 mg Oral BID  . midodrine  5 mg Oral 4 times per day on Wed Thu Fri Sat  . pantoprazole  40 mg Oral Q1200  . warfarin  3 mg Oral ONCE-1800  . Warfarin - Pharmacist Dosing Inpatient   Does not apply q1800   Continuous Infusions:    Principal Problem:   Asystole (HCC) Active Problems:   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   Long term (current) use of anticoagulants [Z79.01]   Cardiac arrest (HCC)   Respiratory arrest (HCC)   Cardiogenic shock (HCC)   Central line insertion site infection   Congestive heart failure (HCC)   Hypokalemia   Long QT interval   Valvular cardiomyopathy (HCC)   Atrial fibrillation (HCC)   Paroxysmal ventricular tachycardia (HCC)   Orthostatic hypotension   Acute on chronic diastolic heart failure (HCC)    Time spent: 25 minutes    Ruqayyah Lute L  [facility] [email or web address], password [facility] 08/21/2015, 1:32 PM  LOS: 16 days

## 2015-08-21 NOTE — Progress Notes (Signed)
Patient ambulated with walker and PT a couple laps around the nursing station and when asked how patient felt she said "well, willing" , PT checked her blood pressure and reported that it went up with there activity. Will continue to monitor.

## 2015-08-21 NOTE — Progress Notes (Signed)
Physical Therapy Treatment Patient Details Name: Diane [name] MRN: [id number] DOB: 07-Jul-1961 Today's Date: 08/21/2015    History of Present Illness Underwent MVR in January 2017. 08/05/15 admitted for PEA/VT arrest, intubated, placed on hypothermic protocol. Thoracentesis 1/23. Extubated 1/24. Pacemaker/ICD implantation 1/27.     PT Comments    Pt advanced gait distance with decreased c/o dizziness.  Pt remains to require cues for pacing.  No drop in BP noted during intervention.    Follow Up Recommendations  SNF     Equipment Recommendations  None recommended by PT    Recommendations for Other Services       Precautions / Restrictions Precautions Precautions: Sternal;ICD/Pacemaker (Pt able to teach back precautions but remains to require cues to maintain.  ) Restrictions Weight Bearing Restrictions: No    Mobility  Bed Mobility Overal bed mobility:  (Pt recieved in recliner chair with feet in dependent position.  )                Transfers Overall transfer level: Needs assistance Equipment used: Rolling walker (2 wheeled) Transfers: Sit to/from Stand Sit to Stand: Min guard         General transfer comment: no complaints of dizziness upon standing.  Pt requires cues for maintaining sternal and ICD precautions.  Pt requires these reminders to and from seated surface.    Ambulation/Gait Ambulation/Gait assistance: Min guard Ambulation Distance (Feet): 268 Feet Assistive device: Rolling walker (2 wheeled) (cues to use RW for support vs balance.  ) Gait Pattern/deviations: Step-through pattern;Decreased step length - right;Decreased step length - left     General Gait Details: Pt denies dizziness during gt training.  Pt appears eager and remains to require min guard for safety with ambulation due to low pressures.  Pt requires cues for pacing and rest periods in standing (x3).  Chair follow remains for safety.     Stairs            Wheelchair Mobility    Modified Rankin (Stroke Patients Only)       Balance Overall balance assessment: Needs assistance Sitting-balance support: No upper extremity supported;Feet supported Sitting balance-Leahy Scale: Good       Standing balance-Leahy Scale: Fair                      Cognition Arousal/Alertness: Awake/alert Behavior During Therapy: WFL for tasks assessed/performed Overall Cognitive Status: Within Functional Limits for tasks assessed Area of Impairment: Safety/judgement         Safety/Judgement: Decreased awareness of safety (remains inconsistent with ability to follow sternal and ICD precautions.  )   Problem Solving: Requires verbal cues General Comments: will further assess higher level cognition    Exercises General Exercises - Lower Extremity Long Arc Quad: AROM;Seated;Both;15 reps Hip ABduction/ADduction: Seated;AROM;15 reps (pillow squeeze and resisted hip abd/add.) Hip Flexion/Marching: AROM;Seated;Both;15 reps    General Comments        Pertinent Vitals/Pain Pain Assessment: No/denies pain    Home Living                      Prior Function            PT Goals (current goals can now be found in the care plan section) Acute Rehab PT Goals Potential to Achieve Goals: Good Progress towards PT goals: Progressing toward goals    Frequency  Min 3X/week    PT Plan      Co-evaluation  End of Session Equipment Utilized During Treatment: Gait belt Activity Tolerance: Patient limited by fatigue Patient left: in chair;with call bell/phone within reach     Time: 0828-0852 PT Time Calculation (min) (ACUTE ONLY): 24 min  Charges:  $Gait Training: 8-22 mins $Therapeutic Exercise: 8-22 mins                    G Codes:      Florestine Avers 08/28/2015, 2:29 PM  Joycelyn Rua, PTA pager [phone]

## 2015-08-21 NOTE — Progress Notes (Signed)
SUBJECTIVE: The patient is feels like she is a bit improved, less dizziness when standing, no fainting yesterday.  At this time, she denies chest pain, shortness of breath, or any new concerns.  Marland Kitchen antiseptic oral rinse  7 mL Mouth Rinse BID  . feeding supplement  1 Container Oral BID BM  . fludrocortisone  0.2 mg Oral BID  . midodrine  5 mg Oral 4 times per day on Wed Thu Fri Sat  . pantoprazole  40 mg Oral Q1200  . Warfarin - Pharmacist Dosing Inpatient   Does not apply q1800      OBJECTIVE: Physical Exam: Filed Vitals:   08/20/15 2029 08/20/15 2336 08/21/15 0455 08/21/15 0501  BP: 105/54 97/48 93/48    Pulse: 97 96 90   Temp: 97.9 F (36.6 C) 98.3 F (36.8 C) 97.8 F (36.6 C)   TempSrc: Oral Oral Oral   Resp: Height:      Weight:    106 lb 9.6 oz (48.353 kg)  SpO2: 97% 98% 99%     Intake/Output Summary (Last 24 hours) at 08/21/15 9604 Last data filed at 08/21/15 0500  Gross per 24 hour  Intake    500 ml  Output    525 ml  Net    -25 ml    Telemetry reveals, sinus rhythm, infrequent A pacing  GEN- The patient is thin, appears in NAD, alert and oriented x 3 today.   Head- normocephalic, atraumatic Eyes-  Sclera clear, conjunctiva pink Ears- hearing intact Oropharynx- clear Neck- supple, no JVP Lungs- Clear to ausculation bilaterally, normal work of breathing Heart- Regular rate and rhythm, no significant murmurs, no rubs or gallops GI- soft, NT, ND (abdominal binder in place) Extremities- no clubbing, cyanosis, or edema Skin- no rash or lesion Psych- euthymic mood, full affect Neuro- no gross deficits appreciated  LABS: Basic Metabolic Panel:  Recent Labs  54/09/81 0540 08/21/15 0300  NA 137 136  K 4.4 3.8  CL 104 102  CO2 26 26  GLUCOSE 107* 104*  BUN 8 6  CREATININE 0.43* 0.43*  CALCIUM 8.8* 9.0  MG 2.1  --    Liver Function Tests:  Recent Labs  08/20/15 0540  AST 29  ALT 34  ALKPHOS 104  BILITOT 0.8  PROT 6.3*  ALBUMIN  3.0*   CBC:  Recent Labs  08/21/15 0300  WBC 8.3  HGB 10.7*  HCT 32.9*  MCV 94.5  PLT 293     ASSESSMENT AND PLAN:  1. S/p Cardiac arrest 08/05/15     Normal coronaries by cath 07/08/15     Hypothermia protocol     MRI with diffuse LGE, EF 45%     EF by echo 08/12/15 was 55-60%     S/p ICD implant 08/15/15  2. Syncope     + orthostatic here     Proamatine and Florinef started     Abdominal binder in place     With some improvement yesterday     Dr. Graciela Husbands noted: The lack of HR response raises spectre of autonomic process but the absence of orthostasis during eval for syncope makes that unlikely     In discussion with Dr. Graciela Husbands, make sure she is ambulated today charting any symptoms and her BP     If doing better/stable, may be ready for discharge tomorrow to SNF/rehab from our standpoint  3. MVR, ligation PDA, LAA clipping on 07/22/15  4. PAF (post arrest)  On Warfarin, probably to be short term     CHADS2Vasc is one     INR is 1.46, pharmacy managing     No need to keep in hospital until therapeutic, to be followed out patient at SNF once discharged   Plan is for SNF/rehab upon discharge.  Francis Dowse, PA-C 08/21/2015 8:14 AM   EP Attending  Patient seen and examined. CV - RRR, Lungs - clear, extremeties - no edema A/P 1. Orthostasis - she has abdominal binders and she is improved. She walked today. Will continue her current meds and increase her physical activity as tolerated 2. Cardiac arrest - s/p ICD. Normal device function.  Leonia Reeves.D.

## 2015-08-22 LAB — PROTIME-INR
INR: 1.53 — AB (ref 0.00–1.49)
PROTHROMBIN TIME: 18.4 s — AB (ref 11.6–15.2)

## 2015-08-22 MED ORDER — WARFARIN SODIUM 2 MG PO TABS
3.0000 mg | ORAL_TABLET | Freq: Once | ORAL | Status: AC
  Administered 2015-08-22: 3 mg via ORAL
  Filled 2015-08-22: qty 1

## 2015-08-22 MED ORDER — MIDODRINE HCL 5 MG PO TABS
10.0000 mg | ORAL_TABLET | ORAL | Status: DC
  Administered 2015-08-22 – 2015-08-23 (×7): 10 mg via ORAL
  Filled 2015-08-22 (×7): qty 2

## 2015-08-22 NOTE — Progress Notes (Signed)
    SUBJECTIVE: The patient is doing well this morning, oob to eat breakfast.  At this time, she denies chest pain, shortness of breath.  SYNCOPE noted last pm     Orthostasis is better   CURRENT MEDICATIONS: . antiseptic oral rinse  7 mL Mouth Rinse BID  . feeding supplement  1 Container Oral BID BM  . fludrocortisone  0.2 mg Oral BID  . midodrine  10 mg Oral 4 times per day on Wed Thu Fri Sat  . pantoprazole  40 mg Oral Q1200  . Warfarin - Pharmacist Dosing Inpatient   Does not apply q1800      OBJECTIVE: Physical Exam: Filed Vitals:   08/21/15 1952 08/21/15 2309 08/22/15 0410 08/22/15 0839  BP:  103/56 84/41 92/42   Pulse:  87 91 92  Temp: 98.6 F (37 C) 98.3 F (36.8 C) 98.1 F (36.7 C) 98.5 F (36.9 C)  TempSrc: Oral Oral Oral Oral  Resp:  17 16 16   Height:      Weight:   107 lb 11.2 oz (48.852 kg)   SpO2: 97% 100% 99% 99%    Intake/Output Summary (Last 24 hours) at 08/22/15 0905 Last data filed at 08/22/15 0300  Gross per 24 hour  Intake    120 ml  Output    700 ml  Net   -580 ml    Telemetry reveals SR, infrequently with V pacing  GEN- The patient is thin, well appearing, alert and oriented x 3 today.   Head- normocephalic, atraumatic Eyes-  Sclera clear, conjunctiva pink Ears- hearing intact Oropharynx- clear Neck- supple  Lungs- Clear to ausculation bilaterally, normal work of breathing Heart- Regular rate and rhythm  GI- soft, NT, ND Extremities- no clubbing, cyanosis, or edema Skin- no rash or lesion, thoracotomy wound is dry, pacer site is dressed. Psych- euthymic mood, full affect Neuro- strength and sensation are intact  LABS: Basic Metabolic Panel:  Recent Labs  16/10/96 0540 08/21/15 0300  NA 137 136  K 4.4 3.8  CL 104 102  CO2 26 26  GLUCOSE 107* 104*  BUN 8 6  CREATININE 0.43* 0.43*  CALCIUM 8.8* 9.0  MG 2.1  --     ASSESSMENT AND PLAN:    1. Cardiac arrest  2.  Paroxysmal atrial fibrillation   3.  MR s/p MVR and  ligation PDA, clipping LAA on 07/22/15  4) infiltrative cardiomyopathy  5) orthostatic hypotension      Continue med Rx for orthostasis  Increase proamatine Will continue abd binder and add thigh high support  The lack of HR response raises spectre of autonomic process but the absence of orthostasis during eval for syncope makes that unlikely  Improved   Will follow on inpt Rehab

## 2015-08-22 NOTE — Clinical Social Work Note (Signed)
CSW updated facility on patient's expected date of discharge. Facility states that the BCBS rep will want updated clinicals Monday morning. Facility admissions coordinator states that the patient will likely not get a second approval for SNF given her significant improvement with PT. CSW will send updated PT/OT notes to facility Monday morning. RNCM updated on situation.   Roddie Mc MSW, [place], [place], [phone]

## 2015-08-22 NOTE — Progress Notes (Signed)
ANTICOAGULATION CONSULT NOTE - Follow up Consult  Pharmacy Consult for coumadin Indication: atrial fibrillation  Allergies  Allergen Reactions  . Vicodin [Hydrocodone-Acetaminophen] Other (See Comments)    "she feels out of it", hallucinating  . Adhesive [Tape] Itching and Rash  . Silicone Itching and Rash    TAPE ALLERGY/EKG STICKER ALLERGY, Please use "paper" tape only    Patient Measurements: Height: 5\' 2"  (157.5 cm) Weight: 107 lb 11.2 oz (48.852 kg) IBW/kg (Calculated) : 50.1  Vital Signs: Temp: 98.5 F (36.9 C) (02/03 0839) Temp Source: Oral (02/03 0839) BP: 92/42 mmHg (02/03 0839) Pulse Rate: 92 (02/03 0839)  Labs:  Recent Labs  08/20/15 0540 08/21/15 0300 08/22/15 0436  HGB  --  10.7*  --   HCT  --  32.9*  --   PLT  --  293  --   LABPROT 17.4* 17.8* 18.4*  INR 1.42 1.46 1.53*  CREATININE 0.43* 0.43*  --     Estimated Creatinine Clearance: 60.6 mL/min (by C-G formula based on Cr of 0.43).   Medical History: Past Medical History  Diagnosis Date  . Severe mitral regurgitation 07/03/2015  . Chronic combined systolic and diastolic CHF (congestive heart failure) (HCC) 07/03/2015    Grade 3 diastolic dysfunction.  LVEF 40-45% 05/2015.  Marland Kitchen Aortic aneurysm (HCC)   . Patent ductus arteriosus 07/16/2015  . Depression     SOME DEPRESSION (ON MEDS FOR 2 YRS) WHEN HER MOTHER PASSED  . 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 Pt was on warfarin PTA for MVR. Was on heparin during this admission initially. Heparin stopped prior to ICD implant 1/27. Per EP note, Resume warfarin for short-term. Per Dr. Gwenlyn Perking, ok to start warfarin with no bridging. MVR, ligation PDA, LAA clipping on 07/22/15  PO intake is decreased PTA dose: warfarin 2mg   daily Restarted warfarin 1/29, INR 1.46  Goal of Therapy:  INR 2-3 Monitor platelets by anticoagulation protocol: Yes   Plan:  Continue warfarin 3mg  x 1 again today, and reassess tomorrow Daily PT/INR Monitor CBC, PO intake, drug interactions  Thank you for allowing [place] to participate in this patients care. Signe Colt, PharmD Pager: [phone] 08/22/2015,10:58 AM

## 2015-08-22 NOTE — Progress Notes (Signed)
Pt sister Georgianne Fick MD is requesting to be called and  updated by Attending MD  ( Dr. Lendell Caprice ) and Dr. Graciela Husbands. Dr. Lendell Caprice informed. Drs.  Graciela Husbands and Lendell Caprice  informed  of request  With phone number to call. [phone]. Pt gave go ahead for MD's to speak with her sister Georgianne Fick MD.

## 2015-08-22 NOTE — Progress Notes (Addendum)
[facility] PROGRESS NOTE  Diane [name] NUU:[id number] DOB: 1961/06/26 DOA: 08/05/2015 PCP: Emeterio Reeve, MD  Brief summary 55 y.o. female who collapsed at home, record indicates unwitnessed, out of the sight of family 1 minute prior to being found and was given CPR by a family member until EMS arrived, who reportedly found her in VT and was cardioverted and intubated. Per record, she was found in her living room after being out of sight about 1 min. EMS suctioned 150 ml fluid and CPR for 7 min with return of pulse, Then en route lost pulse again. CPR and epi. Here in ER V tach X 3 with shocks to A fib. She was reported comatose upon arrival to ER and hypothermia protocol initiated. Patient was transferred to the floor on 1/26 and care passed to [facility]. Since then patient has had cardiac MRI and placement of dual chamber pacemaker/ICD.    Assessment/Plan: 1-VDRF in setting of asystole, VT, PEA: -extubated on 1/24 Resolved. 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 -continue holding diuretics and any antihypertensive currently   Orthostatic hypothension, syncope. Still with significant drops when standing. Increasing midodrine. Dr. Graciela Husbands recommends hold on transfer to SNF until Monday.  4-atrial fibrillation: S/P LAA clipping 07/22/15  -rate controlled at this time -will follow cardiology rec's -on coumadin per pharmacy short term: per EP note, likely d/c at f/u [facility] score 0-1  Pleural effusion: s/p left thoracentesis and steroids for dresslers  5-hypokalemia Corrected. Mag ok.  6-transaminitis: resolved  7-normocitic anemia: stable -no signs of overt bleeding currently   Code Status: Full Family Communication: left message with sister per patient request Disposition Plan: eventual  SNF   Consultants:  PCCM  Cardiology service   neurology  Procedures and significant events: 1/17 Admitted for PEA/VT arrest, intubated, placed on hypothermic protocol  1/17 CTA chest with no PO, moderate bilateral pleural effusions and consolidative changes of lower lobes 1/17 Echo with normal EF, mild LVH, mild LAW, trace AI, mild MS, no MR, small pericardial effusion, left pleural effusion 1/18 EEG with generalized slowing  1/19 Rewarmed, paralytics, sedation turned off 1/19 CT head normal 1/20 CXR with left sided pleural effusion and b/[name] pulmonary edema  1/20 INR 9.4 requiring FFP and vitamin K 1/20 IV amio stopped  1/20 EEG with generalized slowing  1/21 Improved mental status, start IV heparin  1/22 Vomited tube feeds, KUB normal  1/23 Thoracentesis of large left effusion with 950 cc semi-hemorrhagic fluid. IV heparin stopped. 1/23 2D-echo with EF 55-60% MV with no significant regurgitation  1/24- extubated 1/27 pacemaker/ICd implantation   Antibiotics:  Unasyn 1/18>>>1/24   Cefazolin prophylaxis for pacemaker/ICD placement on 1/27  HPI/Subjective: Feels tired and weak.  Objective: Filed Vitals:   08/22/15 0410 08/22/15 0839  BP: 84/41 92/42  Pulse: 91 92  Temp: 98.1 F (36.7 C) 98.5 F (36.9 C)  Resp: 16 16    Intake/Output Summary (Last 24 hours) at 08/22/15 1344 Last data filed at 08/22/15 0900  Gross per 24 hour  Intake    120 ml  Output    700 ml  Net   -580 ml   Filed Weights   08/20/15 0755 08/21/15 0501 08/22/15 0410  Weight: 48.263 kg (106 lb 6.4 oz) 48.353 kg (106 lb 9.6 oz) 48.852 kg (107 lb 11.2 oz)    Exam:   General:  Awake in chair. Weak appearing  Cardiovascular: RRR without MGR.  Respiratory: CTA bilaterally without WRR  Abdomen: soft, NT, ND; positive BS  Musculoskeletal: no edema, no cyanosis   Data Reviewed: Basic Metabolic Panel:  Recent Labs Lab 08/17/15 0538 08/18/15 0535 08/19/15 0715  08/20/15 0540 08/21/15 0300  NA 131* 136 136 137 136  K 3.7 4.0 3.5 4.4 3.8  CL 96* 98* 99* 104 102  CO2 29 28 29 26 26   GLUCOSE 98 95 110* 107* 104*  BUN 17 13 8 8 6   CREATININE 0.49 0.50 0.46 0.43* 0.43*  CALCIUM 8.7* 9.0 8.8* 8.8* 9.0  MG  --   --   --  2.1  --    Liver Function Tests:  Recent Labs Lab 08/20/15 0540  AST 29  ALT 34  ALKPHOS 104  BILITOT 0.8  PROT 6.3*  ALBUMIN 3.0*   CBC:  Recent Labs Lab 08/21/15 0300  WBC 8.3  HGB 10.7*  HCT 32.9*  MCV 94.5  PLT 293   BNP (last 3 results)  Recent Labs  11/21/14 2324  BNP 196.6*   CBG: No results for input(s): GLUCAP in the last 168 hours.  No results found for this or any previous visit (from the past 240 hour(s)).   Studies: No results found.  Scheduled Meds: . antiseptic oral rinse  7 mL Mouth Rinse BID  . feeding supplement  1 Container Oral BID BM  . fludrocortisone  0.2 mg Oral BID  . midodrine  10 mg Oral 4 times per day on Wed Thu Fri Sat  . pantoprazole  40 mg Oral Q1200  . warfarin  3 mg Oral ONCE-1800  . Warfarin - Pharmacist Dosing Inpatient   Does not apply q1800   Continuous Infusions:    Principal Problem:   Asystole (HCC) Active Problems:   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   Long term (current) use of anticoagulants [Z79.01]   Cardiac arrest (HCC)   Respiratory arrest (HCC)   Cardiogenic shock (HCC)   Central line insertion site infection   Congestive heart failure (HCC)   Hypokalemia   Long QT interval   Valvular cardiomyopathy (HCC)   Atrial fibrillation (HCC)   Paroxysmal ventricular tachycardia (HCC)   Orthostatic hypotension   Acute on chronic diastolic heart failure (HCC)    Time spent: 25 minutes    Diane [name]  [facility] [email or web address], password [facility] 08/22/2015, 1:44 PM  LOS: 17 days

## 2015-08-22 NOTE — Progress Notes (Signed)
Pt per report refused to have bed alarm and bed alarm on. Pt continues to refuse both bed and chair alarm. Had spoken with pt to pace herself getting up and out of bed  to chair or to the bathroom. Make sure to hold on to something to help her balance specially when getting up from Bathroom , bed and chair.

## 2015-08-22 NOTE — Progress Notes (Signed)
Inpatient Rehabilitation  Asked by RN CM to screen patient for appropriateness.  Patient was screened by Fae Pippin for an Inpatient Acute Rehab consult.  At this time we are not recommending a consult.  Given that [facility] policy will likely not authorize a stay given her current medical diagnosis.  Additionally, patient is ambulating over 200 feet with Min guard.  Thank you and please call with questions.     Charlane Ferretti., CCC/SLP Admission Coordinator  [facility] Inpatient Rehabilitation  Cell [phone]

## 2015-08-22 NOTE — Progress Notes (Addendum)
The client had her orthostatic vital signs around 2000 and they were positive. ( please see flow sheet ) I physically saw no distress at this time and the client denies any dizziness or light headedness during the vital signs. I will continue to monitor the client closely.

## 2015-08-23 DIAGNOSIS — I5033 Acute on chronic diastolic (congestive) heart failure: Secondary | ICD-10-CM

## 2015-08-23 LAB — BASIC METABOLIC PANEL
ANION GAP: 9 (ref 5–15)
BUN: 6 mg/dL (ref 6–20)
CALCIUM: 8.9 mg/dL (ref 8.9–10.3)
CHLORIDE: 101 mmol/L (ref 101–111)
CO2: 27 mmol/L (ref 22–32)
Creatinine, Ser: 0.71 mg/dL (ref 0.44–1.00)
GFR calc non Af Amer: >60 mL/min (ref >60)
Glucose, Bld: 123 mg/dL — ABNORMAL HIGH (ref 65–99)
Potassium: 4 mmol/L (ref 3.5–5.1)
SODIUM: 137 mmol/L (ref 135–145)

## 2015-08-23 LAB — PROTIME-INR
INR: 1.63 — ABNORMAL HIGH (ref 0.00–1.49)
PROTHROMBIN TIME: 19.3 s — AB (ref 11.6–15.2)

## 2015-08-23 MED ORDER — FLUDROCORTISONE ACETATE 0.1 MG PO TABS
0.3000 mg | ORAL_TABLET | Freq: Two times a day (BID) | ORAL | Status: DC
  Administered 2015-08-23 – 2015-08-27 (×9): 0.3 mg via ORAL
  Filled 2015-08-23 (×12): qty 3

## 2015-08-23 MED ORDER — WARFARIN SODIUM 4 MG PO TABS
4.0000 mg | ORAL_TABLET | Freq: Once | ORAL | Status: AC
  Administered 2015-08-23: 4 mg via ORAL
  Filled 2015-08-23: qty 1

## 2015-08-23 MED ORDER — COSYNTROPIN 0.25 MG IJ SOLR
0.2500 mg | Freq: Once | INTRAMUSCULAR | Status: AC
  Administered 2015-08-24: 0.25 mg via INTRAVENOUS
  Filled 2015-08-23: qty 0.25

## 2015-08-23 NOTE — Progress Notes (Addendum)
ANTICOAGULATION CONSULT NOTE - Follow up Consult  Pharmacy Consult for coumadin Indication: atrial fibrillation  Allergies  Allergen Reactions  . Vicodin [Hydrocodone-Acetaminophen] Other (See Comments)    "she feels out of it", hallucinating  . Adhesive [Tape] Itching and Rash  . Silicone Itching and Rash    TAPE ALLERGY/EKG STICKER ALLERGY, Please use "paper" tape only    Patient Measurements: Height: 5\' 2"  (157.5 cm) Weight: 108 lb 11 oz (49.3 kg) IBW/kg (Calculated) : 50.1  Vital Signs: Temp: 97.8 F (36.6 C) (02/04 0811) Temp Source: Oral (02/04 0811) BP: 67/51 mmHg (02/04 0808) Pulse Rate: 91 (02/04 0805)  Labs:  Recent Labs  08/21/15 0300 08/22/15 0436 08/23/15 0357  HGB 10.7*  --   --   HCT 32.9*  --   --   PLT 293  --   --   LABPROT 17.8* 18.4* 19.3*  INR 1.46 1.53* 1.63*  CREATININE 0.43*  --   --     Estimated Creatinine Clearance: 61.1 mL/min (by C-G formula based on Cr of 0.43).   Medical History: Past Medical History  Diagnosis Date  . Severe mitral regurgitation 07/03/2015  . Chronic combined systolic and diastolic CHF (congestive heart failure) (HCC) 07/03/2015    Grade 3 diastolic dysfunction.  LVEF 40-45% 05/2015.  Marland Kitchen Aortic aneurysm (HCC)   . Patent ductus arteriosus 07/16/2015  . Depression     SOME DEPRESSION (ON MEDS FOR 2 YRS) WHEN HER MOTHER PASSED  . 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 Pt was on warfarin PTA for MVR. Was on heparin during this admission initially. Heparin stopped prior to ICD implant 1/27. Per EP note, Resume warfarin for short-term. Per Dr. Gwenlyn Perking, ok to start warfarin with no bridging. MVR, ligation PDA, LAA clipping on 07/22/15  PTA dose: warfarin 2mg  daily Restarted warfarin  1/29  INR remains subtherapeutic at 1.53, CBC stable, no bleeding noted.   Goal of Therapy:  INR 2-3 Monitor platelets by anticoagulation protocol: Yes   Plan:  Warfarin 4 mg x 1  today Daily PT/INR Monitor for s/sx of bleeding  Hazle Nordmann, PharmD Pharmacy Resident [phone]  08/23/2015,11:11 AM

## 2015-08-23 NOTE — Progress Notes (Signed)
Multiple telephone calls made today with patient's Brother Quita Skye and Harold Hedge  [phone] to discuss d/c plan options for patient.  Currently Lacinda Axon has open auth request for patient and concern is that by Monday patient may not meet SNF criteria for placement for SNF.  Family toured facility and requested seeking placement at [facility] which has denied patient.  CSW spoke with [facility] Admissions who indicated that while unsure as to why they denied- a new auth request would have to be made and it could take at least 48 hours.  Family is aware that [employer] may deny ANY SNF request due to improvement in condition.  Will ask weekday SW to follow up with patient/family on Monday once stability is determined.  Lorri Frederick. Jaci Lazier, [place] [phone]

## 2015-08-23 NOTE — Progress Notes (Signed)
[facility] PROGRESS NOTE  Diane [name] PZW:[id number] DOB: [birth year] DOA: 08/05/2015 PCP: Emeterio Reeve, MD  Brief summary 55 y.o. female who collapsed at home, record indicates unwitnessed, out of the sight of family 1 minute prior to being found and was given CPR by a family member until EMS arrived, who reportedly found her in VT and was cardioverted and intubated. Per record, she was found in her living room after being out of sight about 1 min. EMS suctioned 150 ml fluid and CPR for 7 min with return of pulse, Then en route lost pulse again. CPR and epi. Here in ER V tach X 3 with shocks to A fib. She was reported comatose upon arrival to ER and hypothermia protocol initiated. Patient was transferred to the floor on 1/26 and care passed to [facility]. Since then patient has had cardiac MRI and placement of dual chamber pacemaker/ICD.    Assessment/Plan: 1-VDRF in setting of asystole, VT, PEA: -extubated on 1/24 Resolved. 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 -continue holding diuretics and any antihypertensive currently   Orthostatic hypothension, syncope. Still with significant drops when standing. Cardiology increased florinef today.  4-atrial fibrillation: S/P LAA clipping 07/22/15  -rate controlled at this time -will follow cardiology rec's -on coumadin per pharmacy short term: per EP note, likely d/c at f/u [facility] score 0-1  Pleural effusion: s/p left thoracentesis and steroids for dresslers  5-hypokalemia Corrected. Mag ok.  6-transaminitis: resolved  7-normocitic anemia: stable -no signs of overt bleeding currently   Code Status: Full Family Communication: left message with sister per patient request Disposition Plan: eventual SNF   Consultants:  PCCM  Cardiology  service   neurology  Procedures and significant events: 1/17 Admitted for PEA/VT arrest, intubated, placed on hypothermic protocol  1/17 CTA chest with no PO, moderate bilateral pleural effusions and consolidative changes of lower lobes 1/17 Echo with normal EF, mild LVH, mild LAW, trace AI, mild MS, no MR, small pericardial effusion, left pleural effusion 1/18 EEG with generalized slowing  1/19 Rewarmed, paralytics, sedation turned off 1/19 CT head normal 1/20 CXR with left sided pleural effusion and b/l pulmonary edema  1/20 INR 9.4 requiring FFP and vitamin K 1/20 IV amio stopped  1/20 EEG with generalized slowing  1/21 Improved mental status, start IV heparin  1/22 Vomited tube feeds, KUB normal  1/23 Thoracentesis of large left effusion with 950 cc semi-hemorrhagic fluid. IV heparin stopped. 1/23 2D-echo with EF 55-60% MV with no significant regurgitation  1/24- extubated 1/27 pacemaker/ICd implantation   Antibiotics:  Unasyn 1/18>>>1/24   Cefazolin prophylaxis for pacemaker/ICD placement on 1/27  HPI/Subjective: Feels tired and weak.  Objective: Filed Vitals:   08/23/15 0808 08/23/15 0811  BP: 67/51   Pulse:    Temp:  97.8 F (36.6 C)  Resp:  18    Intake/Output Summary (Last 24 hours) at 08/23/15 1052 Last data filed at 08/23/15 0500  Gross per 24 hour  Intake    360 ml  Output    975 ml  Net   -615 ml   Filed Weights   08/21/15 0501 08/22/15 0410 08/23/15 0445  Weight: 48.353 kg (106 lb 9.6 oz) 48.852 kg (107 lb 11.2 oz) 49.3 kg (108 lb 11 oz)    Exam:   General:  Weak appearing in bed  Cardiovascular: RRR without MGR.  Respiratory: CTA bilaterally without WRR  Abdomen: soft, NT, ND; positive BS  Musculoskeletal: no edema, no cyanosis   Data Reviewed: Basic Metabolic Panel:  Recent Labs Lab 08/17/15 0538 08/18/15 0535 08/19/15 0715 08/20/15 0540 08/21/15 0300  NA 131* 136 136 137 136  K 3.7 4.0 3.5 4.4 3.8  CL 96* 98* 99*  104 102  CO2 GLUCOSE 98 95 110* 107* 104*  BUN CREATININE 0.49 0.50 0.46 0.43* 0.43*  CALCIUM 8.7* 9.0 8.8* 8.8* 9.0  MG  --   --   --  2.1  --    Liver Function Tests:  Recent Labs Lab 08/20/15 0540  AST 29  ALT 34  ALKPHOS 104  BILITOT 0.8  PROT 6.3*  ALBUMIN 3.0*   CBC:  Recent Labs Lab 08/21/15 0300  WBC 8.3  HGB 10.7*  HCT 32.9*  MCV 94.5  PLT 293   BNP (last 3 results)  Recent Labs  11/21/14 2324  BNP 196.6*   CBG: No results for input(s): GLUCAP in the last 168 hours.  No results found for this or any previous visit (from the past 240 hour(s)).   Studies: No results found.  Scheduled Meds: . antiseptic oral rinse  7 mL Mouth Rinse BID  . feeding supplement  1 Container Oral BID BM  . fludrocortisone  0.2 mg Oral BID  . midodrine  10 mg Oral 4 times per day on Wed Thu Fri Sat  . pantoprazole  40 mg Oral Q1200  . 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

## 2015-08-23 NOTE — Progress Notes (Signed)
Patient's sister Cardell Peach called CSW this afternoon- very concerned about discussions re: possible d/c first of the week as she states nursing staff continue to contact family that patient is extremely dizzy and high risk for falls.  She is concerned that patient is not at high level enough to return home. CSW encouraged family to discuss with MD.  Lupita Leash T. Jaci Lazier, [place] [phone]

## 2015-08-23 NOTE — Progress Notes (Addendum)
   SUBJECTIVE:  Feeling tired.  Still complains of dizziness but improved from prior.   PHYSICAL EXAM Filed Vitals:   08/23/15 0800 08/23/15 0805 08/23/15 0808 08/23/15 0811  BP: 93/45  67/51   Pulse: 91 91    Temp:    97.8 F (36.6 C)  TempSrc:    Oral  Resp: 16   18  Height:      Weight:      SpO2:    100%   General:  Appears fatigued.  NAD Lungs:  CTAB.  No crackles, rhonchi or wheezes.  Heart:  RRR.  No m/r/g.  Normal S1/S2 Abdomen:  Soft, NT, NT.  +BS Extremities:  WWP.  No edema.   LABS: Lab Results  Component Value Date   TROPONINI 0.65* 08/06/2015   Results for orders placed or performed during the hospital encounter of 08/05/15 (from the past 24 hour(s))  Protime-INR     Status: Abnormal   Collection Time: 08/23/15  3:57 AM  Result Value Ref Range   Prothrombin Time 19.3 (H) 11.6 - 15.2 seconds   INR 1.63 (H) 0.00 - 1.49    Intake/Output Summary (Last 24 hours) at 08/23/15 1126 Last data filed at 08/23/15 0500  Gross per 24 hour  Intake    360 ml  Output    975 ml  Net   -615 ml    Telemetry: No events.  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 Cardiac arrest: Now s/p ICD.  No arrhythmias on telemetry.  2. Paroxysmal atrial fibrillation: Currently in sinus rhythm.  Anticoagulation with warfarin for valvular atrial fibrillation.  3. 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  However, cardiac MRI revealed asymmetric septal hypertrophy and delayed enhancement most prominent in the inferior wall.  Unclear, how this may be related to her cardiac arrest.  BP too low for ACE-I/ARB or beta blocker.  5) orthostatic hypotension: Ms. Ehler continues to have symptomatic orthostatic hypotension.  BP this am was 67/51.  Will increase florinef to 0.3mg  bid and check a BMP today.  Continue midodrine 10 mg qid, abdominal binder and compression stockings.    Time spent with patient: 35 minutes.   Danthony Kendrix C. Duke Salvia, MD, [facility] 08/23/2015 11:26 AM

## 2015-08-23 NOTE — Progress Notes (Signed)
UR Completed Johan Creveling Graves-Bigelow, RN,BSN [phone]  

## 2015-08-23 NOTE — Progress Notes (Signed)
Received a phone call from patient's sister in law Duaine Dredge [phone]) stating patient and family did not want patient discharged to [facility] on Monday.  Sister in law, Maralyn Sago would like to speak with Child psychotherapist.  Lovette Cliche CSW and [name] RN CM notified.  Colman Cater

## 2015-08-23 NOTE — Progress Notes (Signed)
ICD Criteria  Current LVEF:45%. Within 12 months prior to implant: Yes   Heart failure history: Yes, Class I  Cardiomyopathy history: Yes, Non-Ischemic Cardiomyopathy.  Atrial Fibrillation/Atrial Flutter: Yes, Paroxysmal.  Ventricular tachycardia history: Yes, Hemodynamic instability present. VT Type: Sustained Ventricular Tachycardia - Polymorphic.  Cardiac arrest history: Yes, Bradycardia.  History of syndromes with risk of sudden death: No.  Previous ICD: No.  Current ICD indication: Secondary  PPM indication: Yes. Pacing type: Both. Less than 40% RV pacing requirement anticipated. Indication: Chronotropic incompetence   Class I or II Bradycardia indication present: No  Beta Blocker therapy for 3 or more months: No, medical reason.  Ace Inhibitor/ARB therapy for 3 or more months: No, medical reason.

## 2015-08-24 ENCOUNTER — Telehealth: Payer: Self-pay | Admitting: Internal Medicine

## 2015-08-24 DIAGNOSIS — R1310 Dysphagia, unspecified: Secondary | ICD-10-CM

## 2015-08-24 DIAGNOSIS — I5021 Acute systolic (congestive) heart failure: Secondary | ICD-10-CM

## 2015-08-24 DIAGNOSIS — R490 Dysphonia: Secondary | ICD-10-CM

## 2015-08-24 LAB — CBC
HCT: 32.2 % — ABNORMAL LOW (ref 36.0–46.0)
Hemoglobin: 10.6 g/dL — ABNORMAL LOW (ref 12.0–15.0)
MCH: 31.5 pg (ref 26.0–34.0)
MCHC: 32.9 g/dL (ref 30.0–36.0)
MCV: 95.5 fL (ref 78.0–100.0)
PLATELETS: 282 10*3/uL (ref 150–400)
RBC: 3.37 MIL/uL — AB (ref 3.87–5.11)
RDW: 15.6 % — AB (ref 11.5–15.5)
WBC: 5.7 10*3/uL (ref 4.0–10.5)

## 2015-08-24 LAB — PROTIME-INR
INR: 2.35 — ABNORMAL HIGH (ref 0.00–1.49)
PROTHROMBIN TIME: 25.5 s — AB (ref 11.6–15.2)

## 2015-08-24 LAB — BASIC METABOLIC PANEL
Anion gap: 9 (ref 5–15)
CALCIUM: 9 mg/dL (ref 8.9–10.3)
CO2: 28 mmol/L (ref 22–32)
CREATININE: 0.48 mg/dL (ref 0.44–1.00)
Chloride: 102 mmol/L (ref 101–111)
GFR calc Af Amer: >60 mL/min (ref >60)
GFR calc non Af Amer: >60 mL/min (ref >60)
GLUCOSE: 99 mg/dL (ref 65–99)
POTASSIUM: 3.7 mmol/L (ref 3.5–5.1)
SODIUM: 139 mmol/L (ref 135–145)

## 2015-08-24 LAB — ACTH STIMULATION, 3 TIME POINTS
CORTISOL 30 MIN: 18.2 ug/dL
CORTISOL 60 MIN: 21.6 ug/dL
Cortisol, Base: 8.3 ug/dL

## 2015-08-24 MED ORDER — WARFARIN SODIUM 2 MG PO TABS
2.0000 mg | ORAL_TABLET | Freq: Once | ORAL | Status: AC
  Administered 2015-08-24: 2 mg via ORAL
  Filled 2015-08-24: qty 1

## 2015-08-24 MED ORDER — MIDODRINE HCL 5 MG PO TABS
10.0000 mg | ORAL_TABLET | Freq: Four times a day (QID) | ORAL | Status: DC
  Administered 2015-08-24 – 2015-08-27 (×13): 10 mg via ORAL
  Filled 2015-08-24 (×13): qty 2

## 2015-08-24 MED ORDER — MIDODRINE HCL 5 MG PO TABS
10.0000 mg | ORAL_TABLET | Freq: Three times a day (TID) | ORAL | Status: DC
  Administered 2015-08-24: 10 mg via ORAL
  Filled 2015-08-24: qty 2

## 2015-08-24 NOTE — Progress Notes (Signed)
[facility] PROGRESS NOTE  Diane [name] TAV:[id number] DOB: 15-Aug-1960 DOA: 08/05/2015 PCP: Emeterio Reeve, MD  Brief summary 56 y.o. female who collapsed at home, record indicates unwitnessed, out of the sight of family 1 minute prior to being found and was given CPR by a family member until EMS arrived, who reportedly found her in [place] and was cardioverted and intubated. Per record, she was found in her living room after being out of sight about 1 min. EMS suctioned 150 ml fluid and CPR for 7 min with return of pulse, Then en route lost pulse again. CPR and epi. Here in ER V tach X 3 with shocks to A fib. She was reported comatose upon arrival to ER and hypothermia protocol initiated. Patient was transferred to the floor on 1/26 and care passed to [facility]. Since then patient has had cardiac MRI and placement of dual chamber pacemaker/ICD.    Assessment/Plan: 1-VDRF in setting of asystole, VT, PEA: -extubated on 1/24 Resolved. 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 -continue holding diuretics and any antihypertensive currently   Orthostatic hypothension, syncope. Improving on current regimen. Wrote order to ambulate q4 (was q shift, but I dont thing nursing staff doing this)  4-atrial fibrillation: S/P LAA clipping 07/22/15  -rate controlled at this time -will follow cardiology rec's -on coumadin per pharmacy short term: per EP note, likely d/c at f/u [facility] score 0-1  Pleural effusion: s/p left thoracentesis and steroids for dresslers  5-hypokalemia Corrected. Mag ok.  6-transaminitis: resolved  7-normocitic anemia: stable -no signs of overt bleeding currently  Infiltrative cardiomyopathy. To consider eventual endomyocardial biopsy on outpatient basis.  Code Status: Full Family  Communication: left message with sister per patient request Disposition Plan: eventual SNF   Consultants:  PCCM  Cardiology service   neurology  Procedures and significant events: 1/17 Admitted for PEA/VT arrest, intubated, placed on hypothermic protocol  1/17 CTA chest with no PO, moderate bilateral pleural effusions and consolidative changes of lower lobes 1/17 Echo with normal EF, mild LVH, mild LAW, trace AI, mild MS, no MR, small pericardial effusion, left pleural effusion 1/18 EEG with generalized slowing  1/19 Rewarmed, paralytics, sedation turned off 1/19 CT head normal 1/20 CXR with left sided pleural effusion and b/l pulmonary edema  1/20 INR 9.4 requiring FFP and vitamin K 1/20 IV amio stopped  1/20 EEG with generalized slowing  1/21 Improved mental status, start IV heparin  1/22 Vomited tube feeds, KUB normal  1/23 Thoracentesis of large left effusion with 950 cc semi-hemorrhagic fluid. IV heparin stopped. 1/23 2D-echo with EF 55-60% MV with no significant regurgitation  1/24- extubated 1/27 pacemaker/ICd implantation   Antibiotics:  Unasyn 1/18>>>1/24   Cefazolin prophylaxis for pacemaker/ICD placement on 1/27  HPI/Subjective: Dizzy with standing, but doesn't feel she is getting enough assistance with ambulating  Objective: Filed Vitals:   08/24/15 0500 08/24/15 0845  BP: 102/57 90/46  Pulse: 87 89  Temp: 98.9 F (37.2 C) 98.7 F (37.1 C)  Resp: 20 18    Intake/Output Summary (Last 24 hours) at 08/24/15 1048 Last data filed at 08/24/15 0846  Gross per 24 hour  Intake    530 ml  Output    800 ml  Net   -270 ml   Filed Weights   08/22/15 0410 08/23/15 0445 08/24/15 0500  Weight: 48.852 kg (107 lb  11.2 oz) 49.3 kg (108 lb 11 oz) 49.4 kg (108 lb 14.5 oz)    Exam:   General:  Weak appearing in bed  Cardiovascular: RRR without MGR.  Respiratory: CTA bilaterally without WRR  Abdomen: soft, NT, ND; positive BS  Musculoskeletal: no  edema, no cyanosis   Data Reviewed: Basic Metabolic Panel:  Recent Labs Lab 08/19/15 0715 08/20/15 0540 08/21/15 0300 08/23/15 1157 08/24/15 0753  NA 136 137 136 137 139  K 3.5 4.4 3.8 4.0 3.7  CL 99* 104 102 101 102  CO2 GLUCOSE 110* 107* 104* 123* 99  BUN <5*  CREATININE 0.46 0.43* 0.43* 0.71 0.48  CALCIUM 8.8* 8.8* 9.0 8.9 9.0  MG  --  2.1  --   --   --    Liver Function Tests:  Recent Labs Lab 08/20/15 0540  AST 29  ALT 34  ALKPHOS 104  BILITOT 0.8  PROT 6.3*  ALBUMIN 3.0*   CBC:  Recent Labs Lab 08/21/15 0300 08/24/15 0753  WBC 8.3 5.7  HGB 10.7* 10.6*  HCT 32.9* 32.2*  MCV 94.5 95.5  PLT 293 282   BNP (last 3 results)  Recent Labs  11/21/14 2324  BNP 196.6*   CBG: No results for input(s): GLUCAP in the last 168 hours.  No results found for this or any previous visit (from the past 240 hour(s)).   Studies: No results found.  Scheduled Meds: . antiseptic oral rinse  7 mL Mouth Rinse BID  . feeding supplement  1 Container Oral BID BM  . fludrocortisone  0.3 mg Oral BID  . midodrine  10 mg Oral TID AC & HS  . pantoprazole  40 mg Oral Q1200  . 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

## 2015-08-24 NOTE — Telephone Encounter (Signed)
Diane [name]  I took care of this patient in ICU. Sister wanted me to follow her in office. Please give appt for mid -feb 2017

## 2015-08-24 NOTE — Discharge Instructions (Addendum)
Supplemental Discharge Instructions for  Pacemaker/Defibrillator Patients  Activity No heavy lifting or vigorous activity with your left/right arm for 6 to 8 weeks.  Do not raise your left/right arm above your head for one week.  Gradually raise your affected arm as drawn below.           __        08/19/15                  08/20/15                    08/21/15                       08/22/15  NO DRIVING for  6 months     WOUND CARE - Keep the wound area clean and dry.  Do not get this area wet for one week. No showers for one week; you may shower on   08/22/15  . - The tape/steri-strips on your wound will fall off; do not pull them off.  No bandage is needed on the site.  DO  NOT apply any creams, oils, or ointments to the wound area. - If you notice any drainage or discharge from the wound, any swelling or bruising at the site, or you develop a fever > 101? F after you are discharged home, call the office at once.  Special Instructions - You are still able to use cellular telephones; use the ear opposite the side where you have your pacemaker/defibrillator.  Avoid carrying your cellular phone near your device. - When traveling through airports, show security personnel your identification card to avoid being screened in the metal detectors.  Ask the security personnel to use the hand wand. - Avoid arc welding equipment, MRI testing (magnetic resonance imaging), TENS units (transcutaneous nerve stimulators).  Call the office for questions about other devices. - Avoid electrical appliances that are in poor condition or are not properly grounded. - Microwave ovens are safe to be near or to operate.  Additional information for defibrillator patients should your device go off: - If your device goes off ONCE and you feel fine afterward, notify the device clinic nurses. - If your device goes off ONCE and you do not feel well afterward, call 911. - If your device goes off TWICE, call 911. - If your  device goes off THREE times in one day, call 911.  DO NOT DRIVE YOURSELF OR A FAMILY MEMBER WITH A DEFIBRILLATOR TO THE HOSPITAL--CALL 911.  Information on my medicine - Coumadin   (Warfarin)  This medication education was reviewed with me or my healthcare representative as part of my discharge preparation.  The pharmacist that spoke with me during my hospital stay was:  Synetta Fail, RPH  Why was Coumadin prescribed for you? Coumadin was prescribed for you because you have a blood clot or a medical condition that can cause an increased risk of forming blood clots. Blood clots can cause serious health problems by blocking the flow of blood to the heart, lung, or brain. Coumadin can prevent harmful blood clots from forming. As a reminder your indication for Coumadin is:   Stroke Prevention Because Of Atrial Fibrillation  What test will check on my response to Coumadin? While on Coumadin (warfarin) you will need to have an INR test regularly to ensure that your dose is keeping you in the desired range. The INR (international normalized ratio) number is  calculated from the result of the laboratory test called prothrombin time (PT).  If an INR APPOINTMENT HAS NOT ALREADY BEEN MADE FOR YOU please schedule an appointment to have this lab work done by your health care provider within 7 days. Your INR goal is usually a number between:  2 to 3 or your provider may give you a more narrow range like 2-2.5.  Ask your health care provider during an office visit what your goal INR is.  What  do you need to  know  About  COUMADIN? Take Coumadin (warfarin) exactly as prescribed by your healthcare provider about the same time each day.  DO NOT stop taking without talking to the doctor who prescribed the medication.  Stopping without other blood clot prevention medication to take the place of Coumadin may increase your risk of developing a new clot or stroke.  Get refills before you run out.  What do you do if  you miss a dose? If you miss a dose, take it as soon as you remember on the same day then continue your regularly scheduled regimen the next day.  Do not take two doses of Coumadin at the same time.  Important Safety Information A possible side effect of Coumadin (Warfarin) is an increased risk of bleeding. You should call your healthcare provider right away if you experience any of the following: ? Bleeding from an injury or your nose that does not stop. ? Unusual colored urine (red or dark brown) or unusual colored stools (red or black). ? Unusual bruising for unknown reasons. ? A serious fall or if you hit your head (even if there is no bleeding).  Some foods or medicines interact with Coumadin (warfarin) and might alter your response to warfarin. To help avoid this: ? Eat a balanced diet, maintaining a consistent amount of Vitamin K. ? Notify your provider about major diet changes you plan to make. ? Avoid alcohol or limit your intake to 1 drink for women and 2 drinks for men per day. (1 drink is 5 oz. wine, 12 oz. beer, or 1.5 oz. liquor.)  Make sure that ANY health care provider who prescribes medication for you knows that you are taking Coumadin (warfarin).  Also make sure the healthcare provider who is monitoring your Coumadin knows when you have started a new medication including herbals and non-prescription products.  Coumadin (Warfarin)  Major Drug Interactions  Increased Warfarin Effect Decreased Warfarin Effect  Alcohol (large quantities) Antibiotics (esp. Septra/Bactrim, Flagyl, Cipro) Amiodarone (Cordarone) Aspirin (ASA) Cimetidine (Tagamet) Megestrol (Megace) NSAIDs (ibuprofen, naproxen, etc.) Piroxicam (Feldene) Propafenone (Rythmol SR) Propranolol (Inderal) Isoniazid (INH) Posaconazole (Noxafil) Barbiturates (Phenobarbital) Carbamazepine (Tegretol) Chlordiazepoxide (Librium) Cholestyramine (Questran) Griseofulvin Oral Contraceptives Rifampin Sucralfate  (Carafate) Vitamin K   Coumadin (Warfarin) Major Herbal Interactions  Increased Warfarin Effect Decreased Warfarin Effect  Garlic Ginseng Ginkgo biloba Coenzyme Q10 Green tea St. Johns wort    Coumadin (Warfarin) FOOD Interactions  Eat a consistent number of servings per week of foods HIGH in Vitamin K (1 serving =  cup)  Collards (cooked, or boiled & drained) Kale (cooked, or boiled & drained) Mustard greens (cooked, or boiled & drained) Parsley *serving size only =  cup Spinach (cooked, or boiled & drained) Swiss chard (cooked, or boiled & drained) Turnip greens (cooked, or boiled & drained)  Eat a consistent number of servings per week of foods MEDIUM-HIGH in Vitamin K (1 serving = 1 cup)  Asparagus (cooked, or boiled & drained) Broccoli (cooked, boiled &  drained, or raw & chopped) Brussel sprouts (cooked, or boiled & drained) *serving size only =  cup Lettuce, raw (green leaf, endive, romaine) Spinach, raw Turnip greens, raw & chopped   These websites have more information on Coumadin (warfarin):  http://www.king-russell.com/; https://www.hines.net/;   Heart Failure Heart failure is a condition in which the heart has trouble pumping blood. This means your heart does not pump blood efficiently for your body to work well. In some cases of heart failure, fluid may back up into your lungs or you may have swelling (edema) in your lower legs. Heart failure is usually a long-term (chronic) condition. It is important for you to take good care of yourself and follow your health care provider's treatment plan. CAUSES  Some health conditions can cause heart failure. Those health conditions include:  High blood pressure (hypertension). Hypertension causes the heart muscle to work harder than normal. When pressure in the blood vessels is high, the heart needs to pump (contract) with more force in order to circulate blood throughout the body. High blood pressure eventually causes  the heart to become stiff and weak.  Coronary artery disease (CAD). CAD is the buildup of cholesterol and fat (plaque) in the arteries of the heart. The blockage in the arteries deprives the heart muscle of oxygen and blood. This can cause chest pain and may lead to a heart attack. High blood pressure can also contribute to CAD.  Heart attack (myocardial infarction). A heart attack occurs when one or more arteries in the heart become blocked. The loss of oxygen damages the muscle tissue of the heart. When this happens, part of the heart muscle dies. The injured tissue does not contract as well and weakens the heart's ability to pump blood.  Abnormal heart valves. When the heart valves do not open and close properly, it can cause heart failure. This makes the heart muscle pump harder to keep the blood flowing.  Heart muscle disease (cardiomyopathy or myocarditis). Heart muscle disease is damage to the heart muscle from a variety of causes. These can include drug or alcohol abuse, infections, or unknown reasons. These can increase the risk of heart failure.  Lung disease. Lung disease makes the heart work harder because the lungs do not work properly. This can cause a strain on the heart, leading it to fail.  Diabetes. Diabetes increases the risk of heart failure. High blood sugar contributes to high fat (lipid) levels in the blood. Diabetes can also cause slow damage to tiny blood vessels that carry important nutrients to the heart muscle. When the heart does not get enough oxygen and food, it can cause the heart to become weak and stiff. This leads to a heart that does not contract efficiently.  Other conditions can contribute to heart failure. These include abnormal heart rhythms, thyroid problems, and low blood counts (anemia). Certain unhealthy behaviors can increase the risk of heart failure, including:  Being overweight.  Smoking or chewing tobacco.  Eating foods high in fat and  cholesterol.  Abusing illicit drugs or alcohol.  Lacking physical activity. SYMPTOMS  Heart failure symptoms may vary and can be hard to detect. Symptoms may include:  Shortness of breath with activity, such as climbing stairs.  Persistent cough.  Swelling of the feet, ankles, legs, or abdomen.  Unexplained weight gain.  Difficulty breathing when lying flat (orthopnea).  Waking from sleep because of the need to sit up and get more air.  Rapid heartbeat.  Fatigue and loss of energy.  Feeling light-headed, dizzy, or close to fainting.  Loss of appetite.  Nausea.  Increased urination during the night (nocturia). DIAGNOSIS  A diagnosis of heart failure is based on your history, symptoms, physical examination, and diagnostic tests. Diagnostic tests for heart failure may include:  Echocardiography.  Electrocardiography.  Chest X-ray.  Blood tests.  Exercise stress test.  Cardiac angiography.  Radionuclide scans. TREATMENT  Treatment is aimed at managing the symptoms of heart failure. Medicines, behavioral changes, or surgical intervention may be necessary to treat heart failure.  Medicines to help treat heart failure may include:  Angiotensin-converting enzyme (ACE) inhibitors. This type of medicine blocks the effects of a blood protein called angiotensin-converting enzyme. ACE inhibitors relax (dilate) the blood vessels and help lower blood pressure.  Angiotensin receptor blockers (ARBs). This type of medicine blocks the actions of a blood protein called angiotensin. Angiotensin receptor blockers dilate the blood vessels and help lower blood pressure.  Water pills (diuretics). Diuretics cause the kidneys to remove salt and water from the blood. The extra fluid is removed through urination. This loss of extra fluid lowers the volume of blood the heart pumps.  Beta blockers. These prevent the heart from beating too fast and improve heart muscle  strength.  Digitalis. This increases the force of the heartbeat.  Healthy behavior changes include:  Obtaining and maintaining a healthy weight.  Stopping smoking or chewing tobacco.  Eating heart-healthy foods.  Limiting or avoiding alcohol.  Stopping illicit drug use.  Physical activity as directed by your health care provider.  Surgical treatment for heart failure may include:  A procedure to open blocked arteries, repair damaged heart valves, or remove damaged heart muscle tissue.  A pacemaker to improve heart muscle function and control certain abnormal heart rhythms.  An internal cardioverter defibrillator to treat certain serious abnormal heart rhythms.  A left ventricular assist device (LVAD) to assist the pumping ability of the heart. HOME CARE INSTRUCTIONS   Take medicines only as directed by your health care provider. Medicines are important in reducing the workload of your heart, slowing the progression of heart failure, and improving your symptoms.  Do not stop taking your medicine unless directed by your health care provider.  Do not skip any dose of medicine.  Refill your prescriptions before you run out of medicine. Your medicines are needed every day.  Engage in moderate physical activity if directed by your health care provider. Moderate physical activity can benefit some people. The elderly and people with severe heart failure should consult with a health care provider for physical activity recommendations.  Eat heart-healthy foods. Food choices should be free of trans fat and low in saturated fat, cholesterol, and salt (sodium). Healthy choices include fresh or frozen fruits and vegetables, fish, lean meats, legumes, fat-free or low-fat dairy products, and whole grain or high fiber foods. Talk to a dietitian to learn more about heart-healthy foods.  Limit sodium if directed by your health care provider. Sodium restriction may reduce symptoms of heart  failure in some people. Talk to a dietitian to learn more about heart-healthy seasonings.  Use healthy cooking methods. Healthy cooking methods include roasting, grilling, broiling, baking, poaching, steaming, or stir-frying. Talk to a dietitian to learn more about healthy cooking methods.  Limit fluids if directed by your health care provider. Fluid restriction may reduce symptoms of heart failure in some people.  Weigh yourself every day. Daily weights are important in the early recognition of excess fluid. You should weigh yourself every  morning after you urinate and before you eat breakfast. Wear the same amount of clothing each time you weigh yourself. Record your daily weight. Provide your health care provider with your weight record.  Monitor and record your blood pressure if directed by your health care provider.  Check your pulse if directed by your health care provider.  Lose weight if directed by your health care provider. Weight loss may reduce symptoms of heart failure in some people.  Stop smoking or chewing tobacco. Nicotine makes your heart work harder by causing your blood vessels to constrict. Do not use nicotine gum or patches before talking to your health care provider.  Keep all follow-up visits as directed by your health care provider. This is important.  Limit alcohol intake to no more than 1 drink per day for nonpregnant women and 2 drinks per day for men. One drink equals 12 ounces of beer, 5 ounces of wine, or 1 ounces of hard liquor. Drinking more than that is harmful to your heart. Tell your health care provider if you drink alcohol several times a week. Talk with your health care provider about whether alcohol is safe for you. If your heart has already been damaged by alcohol or you have severe heart failure, drinking alcohol should be stopped completely.  Stop illicit drug use.  Stay up-to-date with immunizations. It is especially important to prevent respiratory  infections through current pneumococcal and influenza immunizations.  Manage other health conditions such as hypertension, diabetes, thyroid disease, or abnormal heart rhythms as directed by your health care provider.  Learn to manage stress.  Plan rest periods when fatigued.  Learn strategies to manage high temperatures. If the weather is extremely hot:  Avoid vigorous physical activity.  Use air conditioning or fans or seek a cooler location.  Avoid caffeine and alcohol.  Wear loose-fitting, lightweight, and light-colored clothing.  Learn strategies to manage cold temperatures. If the weather is extremely cold:  Avoid vigorous physical activity.  Layer clothes.  Wear mittens or gloves, a hat, and a scarf when going outside.  Avoid alcohol.  Obtain ongoing education and support as needed.  Participate in or seek rehabilitation as needed to maintain or improve independence and quality of life. SEEK MEDICAL CARE IF:   You have a rapid weight gain.  You have increasing shortness of breath that is unusual for you.  You are unable to participate in your usual physical activities.  You tire easily.  You cough more than normal, especially with physical activity.  You have any or more swelling in areas such as your hands, feet, ankles, or abdomen.  You are unable to sleep because it is hard to breathe.  You feel like your heart is beating fast (palpitations).  You become dizzy or light-headed upon standing up. SEEK IMMEDIATE MEDICAL CARE IF:   You have difficulty breathing.  There is a change in mental status such as decreased alertness or difficulty with concentration.  You have a pain or discomfort in your chest.  You have an episode of fainting (syncope). MAKE SURE YOU:   Understand these instructions.  Will watch your condition.  Will get help right away if you are not doing well or get worse.   This information is not intended to replace advice given  to you by your health care provider. Make sure you discuss any questions you have with your health care provider.   Document Released: 07/05/2005 Document Revised: 11/19/2014 Document Reviewed: 08/04/2012 Elsevier Interactive Patient Education  2016 [place].

## 2015-08-24 NOTE — Progress Notes (Signed)
ANTICOAGULATION CONSULT NOTE - Follow up Consult  Pharmacy Consult for coumadin Indication: atrial fibrillation  Allergies  Allergen Reactions  . Vicodin [Hydrocodone-Acetaminophen] Other (See Comments)    "she feels out of it", hallucinating  . Adhesive [Tape] Itching and Rash  . Silicone Itching and Rash    TAPE ALLERGY/EKG STICKER ALLERGY, Please use "paper" tape only    Patient Measurements: Height: 5\' 2"  (157.5 cm) Weight: 108 lb 14.5 oz (49.4 kg) IBW/kg (Calculated) : 50.1  Vital Signs: Temp: 98.7 F (37.1 C) (02/05 0845) Temp Source: Oral (02/05 0845) BP: 90/46 mmHg (02/05 0845) Pulse Rate: 89 (02/05 0845)  Labs:  Recent Labs  08/22/15 0436 08/23/15 0357 08/23/15 1157 08/24/15 0753  HGB  --   --   --  10.6*  HCT  --   --   --  32.2*  PLT  --   --   --  282  LABPROT 18.4* 19.3*  --  25.5*  INR 1.53* 1.63*  --  2.35*  CREATININE  --   --  0.71 0.48    Estimated Creatinine Clearance: 61.2 mL/min (by C-G formula based on Cr of 0.48).   Medical History: Past Medical History  Diagnosis Date  . Severe mitral regurgitation 07/03/2015  . Chronic combined systolic and diastolic CHF (congestive heart failure) (HCC) 07/03/2015    Grade 3 diastolic dysfunction.  LVEF 40-45% 05/2015.  Marland Kitchen Aortic aneurysm (HCC)   . Patent ductus arteriosus 07/16/2015  . Depression     SOME DEPRESSION (ON MEDS FOR 2 YRS) WHEN HER MOTHER PASSED  . 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 Pt was on warfarin PTA for MVR. Was on heparin during this admission initially. Heparin stopped prior to ICD implant 1/27. Per EP note, Resume warfarin for short-term. Per Dr. Gwenlyn Perking, ok to start warfarin with no bridging. MVR, ligation PDA, LAA clipping on 07/22/15  PTA  dose: warfarin 2mg  daily Restarted warfarin 1/29  INR therapeutic at 2.35, CBC stable, no bleeding noted. Large jump in INR since yesterday, will decrease dose back to 2 mg x1.   Goal of Therapy:  INR 2-3 Monitor platelets by anticoagulation protocol: Yes   Plan:  Warfarin 2 mg x 1  today Daily PT/INR Monitor for s/sx of bleeding  Hazle Nordmann, PharmD Pharmacy Resident [phone]  08/24/2015,12:11 PM

## 2015-08-24 NOTE — Progress Notes (Signed)
Pt ambulated in hall with front wheel walker with 0 difficulty 150 feet, BP 99/51, Sa02 99% HR 92, states she still feels slightly light headed when walking but overall feels much stronger, very motivated to increase stamina, will continue to monitor closely.  Raymon Mutton RN

## 2015-08-24 NOTE — Progress Notes (Signed)
SUBJECTIVE:  Feeling better. Once to walk.  Still complains of dizziness when she first stands, it improves after a few moments. She was able to stand at the sink and walk around her room today.   PHYSICAL EXAM Filed Vitals:   08/23/15 2000 08/23/15 2249 08/24/15 0500 08/24/15 0845  BP: 119/65 127/65 102/57 90/46  Pulse: 89 91 87 89  Temp: 98.5 F (36.9 C) 97.4 F (36.3 C) 98.9 F (37.2 C) 98.7 F (37.1 C)  TempSrc:  Oral Oral Oral  Resp: 20 18 20 18   Height:      Weight:   49.4 kg (108 lb 14.5 oz)   SpO2: 100% 97% 98% 99%   General:  Appears fatigued.  NAD Lungs:  CTAB.  No crackles, rhonchi or wheezes.  Heart:  RRR.  No m/r/g.  Normal S1/S2 Abdomen:  Soft, NT, NT.  +BS Extremities:  WWP.  Trace edema.   LABS: Lab Results  Component Value Date   TROPONINI 0.65* 08/06/2015   Results for orders placed or performed during the hospital encounter of 08/05/15 (from the past 24 hour(s))  ACTH stimulation, 3 time points     Status: None   Collection Time: 08/24/15  7:22 AM  Result Value Ref Range   Cortisol, Base 8.3 ug/dL   Cortisol, 30 Min 27.7 ug/dL   Cortisol, 60 Min 82.4 ug/dL  Protime-INR     Status: Abnormal   Collection Time: 08/24/15  7:53 AM  Result Value Ref Range   Prothrombin Time 25.5 (H) 11.6 - 15.2 seconds   INR 2.35 (H) 0.00 - 1.49  CBC     Status: Abnormal   Collection Time: 08/24/15  7:53 AM  Result Value Ref Range   WBC 5.7 4.0 - 10.5 K/uL   RBC 3.37 (L) 3.87 - 5.11 MIL/uL   Hemoglobin 10.6 (L) 12.0 - 15.0 g/dL   HCT 23.5 (L) 36.1 - 44.3 %   MCV 95.5 78.0 - 100.0 fL   MCH 31.5 26.0 - 34.0 pg   MCHC 32.9 30.0 - 36.0 g/dL   RDW 15.4 (H) 00.8 - 67.6 %   Platelets 282 150 - 400 K/uL  Basic metabolic panel     Status: Abnormal   Collection Time: 08/24/15  7:53 AM  Result Value Ref Range   Sodium 139 135 - 145 mmol/L   Potassium 3.7 3.5 - 5.1 mmol/L   Chloride 102 101 - 111 mmol/L   CO2 28 22 - 32 mmol/L   Glucose, Bld 99 65 - 99 mg/dL   BUN <5  (L) 6 - 20 mg/dL   Creatinine, Ser 1.95 0.44 - 1.00 mg/dL   Calcium 9.0 8.9 - 09.3 mg/dL   GFR calc non Af Amer >60 >60 mL/min   GFR calc Af Amer >60 >60 mL/min   Anion gap 9 5 - 15    Intake/Output Summary (Last 24 hours) at 08/24/15 1200 Last data filed at 08/24/15 0846  Gross per 24 hour  Intake    530 ml  Output    800 ml  Net   -270 ml    Telemetry: No events.  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 Cardiac arrest: Now s/p ICD.  No arrhythmias on telemetry.  2. Paroxysmal atrial fibrillation: Currently in sinus rhythm.  Anticoagulation with warfarin for valvular atrial fibrillation.  3. 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  However, cardiac MRI revealed asymmetric septal hypertrophy and delayed enhancement most prominent in the inferior wall.  Unclear, how this may be related to her cardiac arrest.  BP too low for ACE-I/ARB or beta blocker.  Once more clinically stable, would consider endomyocardial biopsy on an outpatient basis. This may aid in a definitive, unifying diagnosis.  5) Orthostatic hypotension: Hypotension is improved today. Yesterday morning was increased to 0.3 mg from 0.2 mg twice a day. She still has some dizziness upon standing but feels that her legs are more stable. She would like to start walking today.    Time spent with patient: 25 minutes.   Jaelynn Pozo C. Duke Salvia,  MD, [facility] 08/24/2015 12:00 PM

## 2015-08-25 LAB — PROTIME-INR
INR: 2.39 — ABNORMAL HIGH (ref 0.00–1.49)
Prothrombin Time: 25.8 seconds — ABNORMAL HIGH (ref 11.6–15.2)

## 2015-08-25 MED ORDER — SODIUM CHLORIDE 0.9 % IV SOLN
INTRAVENOUS | Status: DC
  Administered 2015-08-26 (×2): via INTRAVENOUS

## 2015-08-25 MED ORDER — SODIUM CHLORIDE 1 G PO TABS
1.0000 g | ORAL_TABLET | Freq: Two times a day (BID) | ORAL | Status: DC
  Administered 2015-08-26 – 2015-08-28 (×5): 1 g via ORAL
  Filled 2015-08-25 (×10): qty 1

## 2015-08-25 MED ORDER — WARFARIN SODIUM 2 MG PO TABS
2.0000 mg | ORAL_TABLET | Freq: Once | ORAL | Status: DC
  Administered 2015-08-25: 2 mg via ORAL
  Filled 2015-08-25: qty 1

## 2015-08-25 NOTE — Clinical Social Work Note (Signed)
CSW met with patient at bedside to discuss discharge options. Per patient, her family visited [place] this weekend and they were not happy with the facility. CSW explained that no other facilities have offered the patient a bed in [facility]. Per weekend handoff, family is requesting [facility]. CSW reiterated to the patient that [facility] has denied the patient. The patient states that she will not discharge to SNF and feels she can manage at home if her orthostatic hypotension is resolved. CSW has made RNCM aware of situation. Patient continues to request CIR admission.    Liz Beach MSW, [place], [place], [phone]

## 2015-08-25 NOTE — Progress Notes (Signed)
SPOKE WITH dR SOUTH Will order prolactin and Free T4 to exclude acute pituitary failure

## 2015-08-25 NOTE — Progress Notes (Signed)
Thank you for consult on Diane [name]. She is min guard assist for activity with dizziness and near syncope being limiting factor. Therapy notes reviewed and concur with recommendations of SNF level therapies to work with endurance issues. Additionally do not feel that she will not be able to tolerate 3 hrs/day of therapy due to cardiac issues.

## 2015-08-25 NOTE — Progress Notes (Signed)
Physical Therapy Treatment Patient Details Name: Diane [name] MRN: [id number] DOB: [birth year] Today's Date: 08/25/2015    History of Present Illness Underwent MVR in January 2017. 08/05/15 admitted for PEA/VT arrest, intubated, placed on hypothermic protocol. Thoracentesis 1/23. Extubated 1/24. Pacemaker/ICD implantation 1/27.     PT Comments    Diane [name] c/o her head feeling "blurry" when ambulating, which did not worsen w/ ambulatory distance.  BP while ambulating 88/43 and 88/42 once sitting in recliner at end of session.  Pt currently requires skilled assist for safety w/ transfers and ambulation and remains appropriate for SNF at d/c.   Follow Up Recommendations  SNF     Equipment Recommendations  None recommended by PT    Recommendations for Other Services       Precautions / Restrictions Precautions Precautions: Sternal;ICD/Pacemaker;Fall Precaution Comments: orthostatic Restrictions Weight Bearing Restrictions: No    Mobility  Bed Mobility               General bed mobility comments: Pt sitting in recliner chair upon PT arrival  Transfers Overall transfer level: Needs assistance Equipment used: Rolling walker (2 wheeled) Transfers: Sit to/from Stand Sit to Stand: Min guard         General transfer comment: Cues to follow precautions and for proper hand placement.  Min guard, denies dizziness upon standing.  Ambulation/Gait Ambulation/Gait assistance: Min guard Ambulation Distance (Feet): 150 Feet Assistive device: Rolling walker (2 wheeled) Gait Pattern/deviations: Step-through pattern;Decreased stride length   Gait velocity interpretation: Below normal speed for age/gender General Gait Details: Pt c/o blurriness that worsens w/ bright lights (BP 88/45 ambulating in hallway).  Pt reports that her blurry head feeling does not worsen.  BP 88/42 after returning to sitting in recliner chair at end of session.   Stairs            Wheelchair  Mobility    Modified Rankin (Stroke Patients Only)       Balance Overall balance assessment: Needs assistance Sitting-balance support: Feet supported;No upper extremity supported Sitting balance-Leahy Scale: Good     Standing balance support: Bilateral upper extremity supported;During functional activity Standing balance-Leahy Scale: Fair                      Cognition Arousal/Alertness: Awake/alert Behavior During Therapy: WFL for tasks assessed/performed Overall Cognitive Status: Within Functional Limits for tasks assessed                      Exercises General Exercises - Lower Extremity Ankle Circles/Pumps: AROM;Both;10 reps;Seated Long Arc Quad: AROM;Both;10 reps;Seated Hip Flexion/Marching: AROM;Both;10 reps;Seated    General Comments        Pertinent Vitals/Pain Pain Assessment: No/denies pain    Home Living                      Prior Function            PT Goals (current goals can now be found in the care plan section) Acute Rehab PT Goals Patient Stated Goal: to walk as much as possible PT Goal Formulation: With patient Time For Goal Achievement: 09/01/15 Potential to Achieve Goals: Good Progress towards PT goals: Progressing toward goals    Frequency  Min 3X/week    PT Plan Current plan remains appropriate    Co-evaluation             End of Session Equipment Utilized During Treatment: Gait belt Activity Tolerance: Other (comment);Patient tolerated  treatment well (pt orthostatic) Patient left: in chair;with call bell/phone within reach     Time: 1038-1050 PT Time Calculation (min) (ACUTE ONLY): 12 min  Charges:  $Gait Training: 8-22 mins                    G Codes:      Diane [name] PT, [place] [phone] Pager: [phone] 08/25/2015, 11:09 AM

## 2015-08-25 NOTE — Progress Notes (Signed)
While accompanying patient ambulating approximately 50 feet in the hall, I witnessed pt have a near syncopal episode. She became very pale and limp. Assisted pt into a rolling chairto a chair and rolled her  back to her room. We then  assisted her back in to bed. She remained responsive to verbal stimuli. Vital signs obtained. Nasal O2 applied for Sat 90%

## 2015-08-25 NOTE — Progress Notes (Addendum)
[facility] PROGRESS NOTE  Sherelyn Borgelt GTX:[id number] DOB: 28-Jan-1961 DOA: 08/05/2015 PCP: Emeterio Reeve, MD  Brief summary 55 y.o. female who collapsed at home, record indicates unwitnessed, out of the sight of family 1 minute prior to being found and was given CPR by a family member until EMS arrived, who reportedly found her in VT and was cardioverted and intubated. Per record, she was found in her living room after being out of sight about 1 min. EMS suctioned 150 ml fluid and CPR for 7 min with return of pulse, Then en route lost pulse again. CPR and epi. Here in ER V tach X 3 with shocks to A fib. She was reported comatose upon arrival to ER and hypothermia protocol initiated. Patient was transferred to the floor on 1/26 and care passed to [facility]. Since then patient has had cardiac MRI and placement of dual chamber pacemaker/ICD.    Assessment/Plan: Orthostatic hypothension: remains a problem despite max dose florinef, midodrine, abdominal binder and ted hose. Cosyntropin stim normal. Try Pyridostigmine? Defer to cardiology. Refuses SNF and not a candidate for CIR. Discharge when cleared by cardiology.  VDRF in setting of asystole, VT, PEA: -extubated on 1/24 Resolved. sats normal  -aspiration PNA:  -treated 7 days unasyn  acute on chronic combined heart failure/pulmonary edema: -improved/resolved -no signs of fluid overload and continue to experienced orthostatic changes on exam. -continue holding diuretics and any antihypertensive currently   atrial fibrillation: S/P LAA clipping 07/22/15  -rate controlled at this time -will follow cardiology rec's -on coumadin per pharmacy short term: per EP note, likely d/c at f/u [facility] score 0-1  Pleural effusion: s/p left thoracentesis and steroids for dresslers  5-hypokalemia Corrected. Mag ok.  6-transaminitis: resolved  7-normocitic anemia: stable -no signs of overt bleeding currently  Infiltrative  cardiomyopathy. To consider eventual endomyocardial biopsy on outpatient basis.  Code Status: Full Family Communication: left message with sister per patient request Disposition Plan: eventual SNF   Consultants:  PCCM  Cardiology service   neurology  Procedures and significant events: 1/17 Admitted for PEA/VT arrest, intubated, placed on hypothermic protocol  1/17 CTA chest with no PO, moderate bilateral pleural effusions and consolidative changes of lower lobes 1/17 Echo with normal EF, mild LVH, mild LAW, trace AI, mild MS, no MR, small pericardial effusion, left pleural effusion 1/18 EEG with generalized slowing  1/19 Rewarmed, paralytics, sedation turned off 1/19 CT head normal 1/20 CXR with left sided pleural effusion and b/l pulmonary edema  1/20 INR 9.4 requiring FFP and vitamin K 1/20 IV amio stopped  1/20 EEG with generalized slowing  1/21 Improved mental status, start IV heparin  1/22 Vomited tube feeds, KUB normal  1/23 Thoracentesis of large left effusion with 950 cc semi-hemorrhagic fluid. IV heparin stopped. 1/23 2D-echo with EF 55-60% MV with no significant regurgitation  1/24- extubated 1/27 pacemaker/ICd implantation   Antibiotics:  Unasyn 1/18>>>1/24   Cefazolin prophylaxis for pacemaker/ICD placement on 1/27  HPI/Subjective: Dizzy with standing, but overall, feels stronger  Objective: Filed Vitals:   08/25/15 0615 08/25/15 0730  BP: 107/60   Pulse: 90   Temp:  98.3 F (36.8 C)  Resp: 22     Intake/Output Summary (Last 24 hours) at 08/25/15 1605 Last data filed at 08/24/15 2300  Gross per 24 hour  Intake      0 ml  Output    875 ml  Net   -875 ml   Filed Weights   08/23/15 0445 08/24/15 0500 08/25/15 0450  Weight: 49.3 kg (108 lb 11 oz) 49.4 kg (108 lb 14.5 oz) 50.6 kg (111 lb 8.8 oz)    Exam:   General:  Somewhat brighter in chair  Cardiovascular: RRR without MGR.  Respiratory: CTA bilaterally without WRR  Abdomen:  soft, NT, ND; positive BS  Musculoskeletal: no edema, no cyanosis   Data Reviewed: Basic Metabolic Panel:  Recent Labs Lab 08/19/15 0715 08/20/15 0540 08/21/15 0300 08/23/15 1157 08/24/15 0753  NA 136 137 136 137 139  K 3.5 4.4 3.8 4.0 3.7  CL 99* 104 102 101 102  CO2 GLUCOSE 110* 107* 104* 123* 99  BUN <5*  CREATININE 0.46 0.43* 0.43* 0.71 0.48  CALCIUM 8.8* 8.8* 9.0 8.9 9.0  MG  --  2.1  --   --   --    Liver Function Tests:  Recent Labs Lab 08/20/15 0540  AST 29  ALT 34  ALKPHOS 104  BILITOT 0.8  PROT 6.3*  ALBUMIN 3.0*   CBC:  Recent Labs Lab 08/21/15 0300 08/24/15 0753  WBC 8.3 5.7  HGB 10.7* 10.6*  HCT 32.9* 32.2*  MCV 94.5 95.5  PLT 293 282   BNP (last 3 results)  Recent Labs  11/21/14 2324  BNP 196.6*   CBG: No results for input(s): GLUCAP in the last 168 hours.  No results found for this or any previous visit (from the past 240 hour(s)).   Studies: No results found.  Scheduled Meds: . antiseptic oral rinse  7 mL Mouth Rinse BID  . feeding supplement  1 Container Oral BID BM  . fludrocortisone  0.3 mg Oral BID  . midodrine  10 mg Oral QID  . pantoprazole  40 mg Oral Q1200  . warfarin  2 mg Oral ONCE-1800  . 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

## 2015-08-25 NOTE — Progress Notes (Signed)
ANTICOAGULATION CONSULT NOTE - Follow up Consult  Pharmacy Consult for coumadin Indication: atrial fibrillation  Allergies  Allergen Reactions  . Vicodin [Hydrocodone-Acetaminophen] Other (See Comments)    "she feels out of it", hallucinating  . Adhesive [Tape] Itching and Rash  . Silicone Itching and Rash    TAPE ALLERGY/EKG STICKER ALLERGY, Please use "paper" tape only    Patient Measurements: Height: 5\' 2"  (157.5 cm) Weight: 111 lb 8.8 oz (50.6 kg) IBW/kg (Calculated) : 50.1  Vital Signs: Temp: 98.3 F (36.8 C) (02/06 0730) Temp Source: Oral (02/06 0730) BP: 107/60 mmHg (02/06 0615) Pulse Rate: 90 (02/06 0615)  Labs:  Recent Labs  08/23/15 0357 08/23/15 1157 08/24/15 0753 08/25/15 0533  HGB  --   --  10.6*  --   HCT  --   --  32.2*  --   PLT  --   --  282  --   LABPROT 19.3*  --  25.5* 25.8*  INR 1.63*  --  2.35* 2.39*  CREATININE  --  0.71 0.48  --     Estimated Creatinine Clearance: 62.1 mL/min (by C-G formula based on Cr of 0.48).   Medical History: Past Medical History  Diagnosis Date  . Severe mitral regurgitation 07/03/2015  . Chronic combined systolic and diastolic CHF (congestive heart failure) (HCC) 07/03/2015    Grade 3 diastolic dysfunction.  LVEF 40-45% 05/2015.  Marland Kitchen Aortic aneurysm (HCC)   . Patent ductus arteriosus 07/16/2015  . Depression     SOME DEPRESSION (ON MEDS FOR 2 YRS) WHEN HER MOTHER PASSED  . 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 Pt was on warfarin PTA for MVR. Was on heparin during this admission initially. Heparin stopped prior to ICD implant 1/27. Per EP note, Resume warfarin for short-term. Per Dr. Gwenlyn Perking, ok to start warfarin with no bridging. MVR, ligation PDA, LAA clipping on 07/22/15  PTA  dose: warfarin 2mg  daily Restarted warfarin 1/29  INR 2.39 this morning back on home regimen of warfarin.   Goal of Therapy:  INR 2-3 Monitor platelets by anticoagulation protocol: Yes   Plan:  Warfarin 2 mg x 1  today Daily PT/INR Monitor for s/sx of bleeding  Arlean Hopping. Newman Pies, PharmD, BCPS Clinical Pharmacist Pager [phone]  08/25/2015,9:17 AM

## 2015-08-25 NOTE — Progress Notes (Signed)
SUBJECTIVE: The patient is stable today. She reports persistent lightheadedness while walking. If she stands for orthostatics, she reports a heaviness in her head.  Had near syncopal spell this morning while walking. SBP 100 when returned to bed. Orthostatics this morning with SBP to 70.   CURRENT MEDICATIONS: . antiseptic oral rinse  7 mL Mouth Rinse BID  . feeding supplement  1 Container Oral BID BM  . fludrocortisone  0.3 mg Oral BID  . midodrine  10 mg Oral QID  . pantoprazole  40 mg Oral Q1200  . Warfarin - Pharmacist Dosing Inpatient   Does not apply q1800      OBJECTIVE: Physical Exam: Filed Vitals:   08/24/15 2315 08/25/15 0450 08/25/15 0615 08/25/15 0730  BP: 105/53 102/54 107/60   Pulse: 88 91 90   Temp: 97.7 F (36.5 C) 98.1 F (36.7 C)  98.3 F (36.8 C)  TempSrc: Oral Oral  Oral  Resp: '22 20 22   ' Height:      Weight:  111 lb 8.8 oz (50.6 kg)    SpO2: 100% 99% 90% 100%    Intake/Output Summary (Last 24 hours) at 08/25/15 9147 Last data filed at 08/24/15 2300  Gross per 24 hour  Intake    600 ml  Output   1275 ml  Net   -675 ml    Telemetry reveals sinus rhythm  GEN- The patient is thin appearing, alert and oriented x 3 today.   Head- normocephalic, atraumatic Eyes-  Sclera clear, conjunctiva pink Ears- hearing intact Oropharynx- clear Neck- supple  Lungs- Clear to ausculation bilaterally, normal work of breathing Heart- Regular rate and rhythm  GI- soft, NT, ND, + BS, +binder Extremities- no clubbing, cyanosis, or edema Skin- no rash or lesion Psych- euthymic mood, full affect Neuro- strength and sensation are intact  LABS: Basic Metabolic Panel:  Recent Labs  08/23/15 1157 08/24/15 0753  NA 137 139  K 4.0 3.7  CL 101 102  CO2 27 28  GLUCOSE 123* 99  BUN 6 <5*  CREATININE 0.71 0.48  CALCIUM 8.9 9.0   CBC:  Recent Labs  08/24/15 0753  WBC 5.7  HGB 10.6*  HCT 32.2*  MCV 95.5  PLT 282    RADIOLOGY: Dg Chest 2 View  08/15/2015  CLINICAL DATA:  Follow-up pleural effusion EXAM: CHEST  2 VIEW COMPARISON:  08/13/2015 FINDINGS: Right pleural effusion improved. Small left pleural effusion is stable. No pneumothorax. Postoperative changes are stable. Left jugular venous catheter removed. Hyperaeration. Minimal atelectasis at the left base. IMPRESSION: Improved right pleural effusion. Stable left pleural effusion. No pneumothorax. Electronically Signed   By: Marybelle Killings M.D.   On: 08/15/2015 10:36   Mr Card Morphology Wo/w Cm 08/14/2015  CLINICAL DATA:  Cardiac Arrest, Long QT, Cardiomyopathy EXAM: CARDIAC MRI TECHNIQUE: The patient was scanned on a 1.5 Tesla GE magnet. A dedicated cardiac coil was used. Functional imaging was done using Fiesta sequences. 2,3, and 4 chamber views were done to assess for RWMA's. Modified Simpson's rule using a short axis stack was used to calculate an ejection fraction on a dedicated work Conservation officer, nature. The patient received 20 cc of Multihance. After 10 minutes inversion recovery sequences were used to assess for infiltration and scar tissue. CONTRAST:  20 cc Multihance FINDINGS: There was mild LAE. The RA and RV were normal. The LV was mildly enlarged. There was mildly asymmetric hypertrophy of the septum Septum measures 15 mm and posterior wall 10  mm. There is no SAM. The patient is s/p mitral repair with an annuloplasty ring. There is no significant residual MR with mild diastolic bowing. The LAA has been clipped and has no flow. The aortic valve is normal as is the aortic root There is no residual PDA identified. There is a small pericardial effusion and large left pleural effusion. The distal septum and apex appear hypokinetic. The quantitative EF is 45% (EDV 70 cc, ESV 39 cc SV 31 cc) There is a markedly abnormal gadolinium uptake in the LV myocardium on inversion recovery sequences. This uptake is diffuse in the endocardial and mid epicardial areas. Although uptake is diffuse it  is especially marked in the inferior wall. IMPRESSION: 1) Mildly asymmetric septal hypertrophy 15 mm. Distal septal apical hypokinesis EF 45% 2) Diffuse delayed gadolinium uptake in the subendocardial and mid epicardial areas especially marked in the inferior wall 3) S/P mitral valve repair with no residual MR and annuloplasty ring 4) Atri-Clip of the LAA with no flow 5) No residual PDA flow 6) Mild LAE 7) Small pericardial effusion 8) Large left pleural effusion Note delayed gadolinium findings may be consistent with myocarditis vs infiltrative cardiomyopathy [place] Electronically Signed   By: Jenkins Rouge M.D.   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  Cardiac arrest S/p ICD MRI with delayed enhancement No driving x6 months Keep K >3.9, Mg >1.8  2.  Orthostatic hypotension Continue abdominal binder and Florinef Will order Ted hose for today Adequate hydration encouraged  3.  MR s/p MVR Stable No change required today  4.  Paroxysmal atrial fibrillation Maintaining SR Continue Warfarin for now with CHADS2VASC of 1 - can be evaluated further as an outpatient if this will need to be continued long term  Dr Caryl Comes to see today  Chanetta Marshall, NP 08/25/2015 8:14 AM  She continues to struggle with orthostasis despite our efforts to augment her blood pressure.  We will add sodium and I will further increase her ProAmatine. We will also  push fluids.  I remain perplexed as to the cause of her autonomic insufficiency. Reviewing her ACTH stimulation data, they are borderline apparently. The criteria of greater than 18 is clearly met; however there is a comment up to date suggesting that 18--25 can be indeterminate and further that acute injury can be masked because of loss of ACTH but persistent function of the adrenal gland.  Will send  nACHR antibodies and ask neuro to see to look for further evidence of autonomic failure;  It may also be worth measuring norepinephrine response to standing   I find no links between cardiac surgery and orthostasis, and next to nothing on arrest and orthostasis  Will continue to look at amyloid  i spent 5:07-18:04 on this chart and discussing her iwhh UR-MD

## 2015-08-25 NOTE — Progress Notes (Signed)
Pt continues to refuse chair and bed alarm when asked if she would reconsider using them but to no avail. Will contiue to monitor.

## 2015-08-25 NOTE — Progress Notes (Signed)
UR Completed Liat Mayol Graves-Bigelow, RN,BSN [phone]  

## 2015-08-26 LAB — PROTEIN ELECTROPHORESIS, SERUM
A/G RATIO SPE: 1.1 (ref 0.7–1.7)
Albumin ELP: 3.2 g/dL (ref 2.9–4.4)
Alpha-1-Globulin: 0.3 g/dL (ref 0.0–0.4)
Alpha-2-Globulin: 0.4 g/dL (ref 0.4–1.0)
Beta Globulin: 1 g/dL (ref 0.7–1.3)
GLOBULIN, TOTAL: 3 g/dL (ref 2.2–3.9)
Gamma Globulin: 1.3 g/dL (ref 0.4–1.8)
TOTAL PROTEIN ELP: 6.2 g/dL (ref 6.0–8.5)

## 2015-08-26 LAB — PROTIME-INR
INR: 2.19 — AB (ref 0.00–1.49)
PROTHROMBIN TIME: 24.2 s — AB (ref 11.6–15.2)

## 2015-08-26 LAB — PROLACTIN: PROLACTIN: 20.2 ng/mL (ref 4.8–23.3)

## 2015-08-26 LAB — T4, FREE: Free T4: 1.25 ng/dL — ABNORMAL HIGH (ref 0.61–1.12)

## 2015-08-26 LAB — IMMUNOFIXATION, URINE

## 2015-08-26 MED ORDER — WARFARIN SODIUM 2 MG PO TABS
3.0000 mg | ORAL_TABLET | Freq: Once | ORAL | Status: AC
  Administered 2015-08-26: 3 mg via ORAL
  Filled 2015-08-26: qty 1

## 2015-08-26 MED ORDER — DIPHENHYDRAMINE HCL 50 MG/ML IJ SOLN
25.0000 mg | Freq: Once | INTRAMUSCULAR | Status: AC
  Administered 2015-08-26: 25 mg via INTRAVENOUS
  Filled 2015-08-26: qty 1

## 2015-08-26 MED ORDER — PYRIDOSTIGMINE BROMIDE 60 MG PO TABS
30.0000 mg | ORAL_TABLET | Freq: Two times a day (BID) | ORAL | Status: DC
  Administered 2015-08-26: 30 mg via ORAL
  Filled 2015-08-26 (×6): qty 0.5

## 2015-08-26 NOTE — Progress Notes (Signed)
[facility] PROGRESS NOTE  Diane [name]:[id number] DOB: [birth year] DOA: 08/05/2015 PCP: Emeterio Reeve, MD  Brief summary 55 y.o. female who collapsed at home, record indicates unwitnessed, out of the sight of family 1 minute prior to being found and was given CPR by a family member until EMS arrived, who reportedly found her in VT and was cardioverted and intubated. Per record, she was found in her living room after being out of sight about 1 min. EMS suctioned 150 ml fluid and CPR for 7 min with return of pulse, Then en route lost pulse again. CPR and epi. Here in ER V tach X 3 with shocks to A fib. She was reported comatose upon arrival to ER and hypothermia protocol initiated. Patient was transferred to the floor on 1/26 and care passed to [facility]. Since then patient has had cardiac MRI and placement of dual chamber pacemaker/ICD. Severe, refractory orthostatic hypotension continues despite multiple interventions   Assessment/Plan: Orthostatic hypothension: remains a problem despite max dose florinef, midodrine, abdominal binder and ted hose. Cosyntropin stim normal. Prolactin ok. FT4 not low. bp dropped to 66/35 this am with standing despite IVF. D/w Dr. Graciela Husbands. Will start pyridostigmine. Rehab Medicine resommends SNF, CIR declines. Home v SNF when ok with Dr. Graciela Husbands.  VDRF in setting of asystole, VT, PEA: -extubated on 1/24 Resolved. sats normal  -aspiration PNA:  -treated 7 days unasyn  acute on chronic combined heart failure/pulmonary edema: -improved/resolved -no signs of fluid overload and continue to experienced orthostatic changes on exam. -continue holding diuretics and any antihypertensive currently   atrial fibrillation: S/P LAA clipping 07/22/15  -on coumadin per pharmacy short term: per EP note, likely d/c at f/u visit -CHADsVASC score 0-1  Pleural effusion: s/p left thoracentesis and steroids for dresslers  7-normocitic anemia: stable -no signs of overt  bleed  Infiltrative cardiomyopathy. To consider eventual endomyocardial biopsy on outpatient basis.  Code Status: Full Family Communication: discussed with sister 2/5 Disposition Plan: eventual SNF v home when ok with Dr. Graciela Husbands   Consultants:  PCCM  Cardiology  EP  neurology  Procedures and significant events: 1/17 Admitted for PEA/VT arrest, intubated, placed on hypothermic protocol  1/17 CTA chest with no PO, moderate bilateral pleural effusions and consolidative changes of lower lobes 1/17 Echo with normal EF, mild LVH, mild LAW, trace AI, mild MS, no MR, small pericardial effusion, left pleural effusion 1/18 EEG with generalized slowing  1/19 Rewarmed, paralytics, sedation turned off 1/19 CT head normal 1/20 CXR with left sided pleural effusion and b/l pulmonary edema  1/20 INR 9.4 requiring FFP and vitamin K 1/20 IV amio stopped  1/20 EEG with generalized slowing  1/21 Improved mental status, start IV heparin  1/22 Vomited tube feeds, KUB normal  1/23 Thoracentesis of large left effusion with 950 cc semi-hemorrhagic fluid. IV heparin stopped. 1/23 2D-echo with EF 55-60% MV with no significant regurgitation  1/24- extubated 1/27 pacemaker/ICd implantation   Antibiotics:  Unasyn 1/18>>>1/24   Cefazolin prophylaxis for pacemaker/ICD placement on 1/27  HPI/Subjective: Feels very weak today  Objective: Filed Vitals:   08/26/15 1219 08/26/15 1651  BP: 106/62 130/55  Pulse: 90 90  Temp: 97.9 F (36.6 C) 98 F (36.7 C)  Resp:      Intake/Output Summary (Last 24 hours) at 08/26/15 1736 Last data filed at 08/26/15 1300  Gross per 24 hour  Intake 1527.5 ml  Output    825 ml  Net  702.5 ml   [employer]   08/24/15  0500 08/25/15 0450 08/26/15 0537  Weight: 49.4 kg (108 lb 14.5 oz) 50.6 kg (111 lb 8.8 oz) 51.347 kg (113 lb 3.2 oz)    Exam:   General:  In bed. Appears extremely weak  Cardiovascular: RRR without MGR.  Respiratory: CTA  bilaterally without WRR  Abdomen: soft, NT, ND; positive BS  Musculoskeletal: no edema, no cyanosis   Data Reviewed: Basic Metabolic Panel:  Recent Labs Lab 08/20/15 0540 08/21/15 0300 08/23/15 1157 08/24/15 0753  NA 137 136 137 139  K 4.4 3.8 4.0 3.7  CL 104 102 101 102  CO2 26 26 27 28   GLUCOSE 107* 104* 123* 99  BUN 8 6 6  <5*  CREATININE 0.43* 0.43* 0.71 0.48  CALCIUM 8.8* 9.0 8.9 9.0  MG 2.1  --   --   --    Liver Function Tests:  Recent Labs Lab 08/20/15 0540  AST 29  ALT 34  ALKPHOS 104  BILITOT 0.8  PROT 6.3*  ALBUMIN 3.0*   CBC:  Recent Labs Lab 08/21/15 0300 08/24/15 0753  WBC 8.3 5.7  HGB 10.7* 10.6*  HCT 32.9* 32.2*  MCV 94.5 95.5  PLT 293 282   BNP (last 3 results)  Recent Labs  11/21/14 2324  BNP 196.6*   CBG: No results for input(s): GLUCAP in the last 168 hours.  No results found for this or any previous visit (from the past 240 hour(s)).   Studies: No results found.  Scheduled Meds: . antiseptic oral rinse  7 mL Mouth Rinse BID  . feeding supplement  1 Container Oral BID BM  . fludrocortisone  0.3 mg Oral BID  . midodrine  10 mg Oral QID  . pantoprazole  40 mg Oral Q1200  . pyridostigmine  30 mg Oral Q12H  . sodium chloride  1 g Oral BID WC  . Warfarin - Pharmacist Dosing Inpatient   Does not apply q1800   Continuous Infusions: . sodium chloride 125 mL/hr at 08/26/15 0242    Principal Problem:   Asystole (HCC) Active Problems:   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   Long term (current) use of anticoagulants [Z79.01]   Cardiac arrest (HCC)   Respiratory arrest (HCC)   Cardiogenic shock (HCC)   Central line insertion site infection   Congestive heart failure (HCC)   Hypokalemia   Long QT interval   Valvular cardiomyopathy (HCC)   Atrial fibrillation (HCC)   Paroxysmal ventricular tachycardia (HCC)   Orthostatic hypotension   Acute on chronic diastolic heart failure (HCC)  Time  spent: 15 minutes  Nesa Distel L  [facility] [email or web address], password [facility] 08/26/2015, 5:36 PM  LOS: 21 days

## 2015-08-26 NOTE — Clinical Social Work Note (Signed)
CSW has met with patient regarding bed offers for SNF. [facility] still remains a SNF option but the patient/family continue to request [facility]. CSW contacted [facility] to see if facility will offer the patient a bed. Facility states that they can offer the patient a bed from a clinical standpoint, but they will NOT accept patient until [facility] authorization is obtained. CSW thoroughly explained to the patient that if this option is pursued and [employer] denies SNF authorization or does not provide a response within time of being medically stable for discharge then the patient's only option will be to discharge home with HHPT or private pay for SNF. The patient states that she is unable to pay privately for SNF. [facility] admissions coordinator will start [facility] authorization once [facility] has rescinded their authorization request. CSW has made [place] aware that the auth request needs to be rescinded. CSW will continue to follow.    Liz Beach MSW, [place], [place], [phone]

## 2015-08-26 NOTE — Progress Notes (Addendum)
Came to assess/meet the client at shift change and she was having lots of nausea stating after she took a new medication today (mestinon 30 mg) that she feels weaker, coughing a lot,feels like she is having a reaction, can't breathe, just feels like something is wrong. She started vomiting a small amount of clear liquid. Asked to have a break from all the fluid she has been getting the last few days (normal saline continue 125 ml/hr).  I gave her PRN Zofran and 2 liters of nasal canula for comfort. Vital signs were blood pressure 140/72, heart rate 87, oxygen 99 room air, lungs clear, breathing rapid. Spoke with NP Brion Aliment with cardiology and he discontinued the Normal saline, ordered benadryl once IV 25 mg. The client is current feeling better, without nausea or vomiting, not longer feeling short of breath and is resting. I will continue to monitor the client closely.    Addendum:  The client refused her night time dose of the new medication mestinon 30 mg.

## 2015-08-26 NOTE — Progress Notes (Addendum)
ANTICOAGULATION CONSULT NOTE - Follow up Consult  Pharmacy Consult for Warfarin Indication: atrial fibrillation  Allergies  Allergen Reactions  . Vicodin [Hydrocodone-Acetaminophen] Other (See Comments)    "she feels out of it", hallucinating  . Adhesive [Tape] Itching and Rash  . Silicone Itching and Rash    TAPE ALLERGY/EKG STICKER ALLERGY, Please use "paper" tape only    Patient Measurements: Height: 5\' 2"  (157.5 cm) Weight: 113 lb 3.2 oz (51.347 kg) IBW/kg (Calculated) : 50.1  Vital Signs: Temp: 98.8 F (37.1 C) (02/07 0838) Temp Source: Oral (02/07 0838) BP: 80/39 mmHg (02/07 0810) Pulse Rate: 90 (02/07 0838)  Labs:  Recent Labs  08/23/15 1157 08/24/15 0753 08/25/15 0533 08/26/15 0827  HGB  --  10.6*  --   --   HCT  --  32.2*  --   --   PLT  --  282  --   --   LABPROT  --  25.5* 25.8* 24.2*  INR  --  2.35* 2.39* 2.19*  CREATININE 0.71 0.48  --   --     Estimated Creatinine Clearance: 62.1 mL/min (by C-G formula based on Cr of 0.48).   Medical History: Past Medical History  Diagnosis Date  . Severe mitral regurgitation 07/03/2015  . Chronic combined systolic and diastolic CHF (congestive heart failure) (HCC) 07/03/2015    Grade 3 diastolic dysfunction.  LVEF 40-45% 05/2015.  Marland Kitchen Aortic aneurysm (HCC)   . Patent ductus arteriosus 07/16/2015  . Depression     SOME DEPRESSION (ON MEDS FOR 2 YRS) WHEN HER MOTHER PASSED  . 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 Pt was on warfarin PTA for MVR. Was on heparin during this admission initially. Heparin stopped prior to ICD implant 1/27. Per EP note, Resume warfarin for short-term. Per Dr. Gwenlyn Perking, ok to start warfarin with no bridging. MVR, ligation PDA, LAA clipping on 07/22/15  PTA  dose: warfarin 2mg  daily Restarted warfarin 1/29  INR 2.19 this morning back on home regimen of warfarin. It appears that her nurse yesterday did not provide her warfarin dose yesterday evening. Will increase today's dose slightly based on this information.  Goal of Therapy:  INR 2-3 Monitor platelets by anticoagulation protocol: Yes   Plan:  Warfarin 3mg  x 1  today Daily PT/INR Monitor for s/sx of bleeding  Arlean Hopping. Newman Pies, PharmD, BCPS Clinical Pharmacist Pager [phone]  08/26/2015,9:18 AM

## 2015-08-27 ENCOUNTER — Inpatient Hospital Stay (HOSPITAL_COMMUNITY): Payer: BLUE CROSS/BLUE SHIELD

## 2015-08-27 LAB — PROTIME-INR
INR: 2.08 — ABNORMAL HIGH (ref 0.00–1.49)
PROTHROMBIN TIME: 23.2 s — AB (ref 11.6–15.2)

## 2015-08-27 LAB — CBC
HCT: 31.3 % — ABNORMAL LOW (ref 36.0–46.0)
HEMOGLOBIN: 10 g/dL — AB (ref 12.0–15.0)
MCH: 30.5 pg (ref 26.0–34.0)
MCHC: 31.9 g/dL (ref 30.0–36.0)
MCV: 95.4 fL (ref 78.0–100.0)
Platelets: UNDETERMINED 10*3/uL (ref 150–400)
RBC: 3.28 MIL/uL — AB (ref 3.87–5.11)
RDW: 15.8 % — ABNORMAL HIGH (ref 11.5–15.5)
WBC: 7.2 10*3/uL (ref 4.0–10.5)

## 2015-08-27 MED ORDER — BUPROPION HCL 75 MG PO TABS
37.5000 mg | ORAL_TABLET | Freq: Two times a day (BID) | ORAL | Status: DC
  Administered 2015-08-27 – 2015-08-29 (×5): 37.5 mg via ORAL
  Filled 2015-08-27 (×8): qty 0.5

## 2015-08-27 MED ORDER — WARFARIN SODIUM 2 MG PO TABS
3.0000 mg | ORAL_TABLET | Freq: Once | ORAL | Status: AC
  Administered 2015-08-27: 3 mg via ORAL
  Filled 2015-08-27: qty 1

## 2015-08-27 NOTE — Progress Notes (Signed)
ANTICOAGULATION CONSULT NOTE - Follow up Consult  Pharmacy Consult for Warfarin Indication: atrial fibrillation  Allergies  Allergen Reactions  . Vicodin [Hydrocodone-Acetaminophen] Other (See Comments)    "she feels out of it", hallucinating  . Adhesive [Tape] Itching and Rash  . Silicone Itching and Rash    TAPE ALLERGY/EKG STICKER ALLERGY, Please use "paper" tape only    Patient Measurements: Height: 5\' 2"  (157.5 cm) Weight: 117 lb 8 oz (53.298 kg) IBW/kg (Calculated) : 50.1  Vital Signs: Temp: 97.9 F (36.6 C) (02/08 0748) Temp Source: Oral (02/08 0748) BP: 89/51 mmHg (02/08 0515) Pulse Rate: 92 (02/08 0000)  Labs:  Recent Labs  08/25/15 0533 08/26/15 0827 08/27/15 0602  HGB  --   --  10.0*  HCT  --   --  31.3*  PLT  --   --  PLATELET CLUMPS NOTED ON SMEAR, UNABLE TO ESTIMATE  LABPROT 25.8* 24.2* 23.2*  INR 2.39* 2.19* 2.08*    Estimated Creatinine Clearance: 62.1 mL/min (by C-G formula based on Cr of 0.48).   Medical History: Past Medical History  Diagnosis Date  . Severe mitral regurgitation 07/03/2015  . Chronic combined systolic and diastolic CHF (congestive heart failure) (HCC) 07/03/2015    Grade 3 diastolic dysfunction.  LVEF 40-45% 05/2015.  Marland Kitchen Aortic aneurysm (HCC)   . Patent ductus arteriosus 07/16/2015  . Depression     SOME DEPRESSION (ON MEDS FOR 2 YRS) WHEN HER MOTHER PASSED  . 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 Pt was on warfarin PTA for MVR. Was on heparin during this admission initially. Heparin stopped prior to ICD implant 1/27. Per EP note, Resume warfarin for short-term. Per Dr. Gwenlyn Perking, ok to start warfarin with no bridging. MVR, ligation PDA, LAA clipping on 07/22/15  PTA dose: warfarin 2mg   daily Restarted warfarin 1/29  INR dropped to 2.08 as expected from missed dose. Will continue higher dose to maintain therapeutic level.   Goal of Therapy:  INR 2-3 Monitor platelets by anticoagulation protocol: Yes   Plan:  Warfarin 3mg  x 1  today Daily PT/INR Monitor for s/sx of bleeding  Arlean Hopping. Newman Pies, PharmD, BCPS Clinical Pharmacist Pager [phone]  08/27/2015,9:24 AM

## 2015-08-27 NOTE — Progress Notes (Signed)
Physical Therapy Treatment Patient Details Name: Diane [name] MRN: [id number] DOB: 1961-01-10 Today's Date: 08/27/2015    History of Present Illness Underwent MVR in January 2017. 08/05/15 admitted for PEA/VT arrest, intubated, placed on hypothermic protocol. Thoracentesis 1/23. Extubated 1/24. Pacemaker/ICD implantation 1/27.     PT Comments    Diane [name] demonstrated improved tolerance to sit<>stand and ambulation, pt not symptomatic except when weaving around obstacles in hallway.  Pt reports feeling much better today and stronger in Bil LEs.  Pt will benefit from continued skilled PT services to increase functional independence and safety.   Follow Up Recommendations  SNF     Equipment Recommendations  None recommended by PT    Recommendations for Other Services       Precautions / Restrictions Precautions Precautions: Sternal;ICD/Pacemaker;Fall Precaution Comments: orthostatic Restrictions Weight Bearing Restrictions: No    Mobility  Bed Mobility               General bed mobility comments: Pt sitting in recliner chair upon PT arrival  Transfers Overall transfer level: Needs assistance Equipment used: Rolling walker (2 wheeled) Transfers: Sit to/from Stand Sit to Stand: Min guard         General transfer comment: Pt denies dizziness w/ sit>stand and does not requires physical assist or verbal cues.  Ambulation/Gait Ambulation/Gait assistance: Min guard Ambulation Distance (Feet): 250 Feet Assistive device: Rolling walker (2 wheeled) Gait Pattern/deviations: Step-through pattern;Decreased stride length   Gait velocity interpretation: Below normal speed for age/gender General Gait Details: Pt does not WB through RW but uses it for comfort, attempts w/o RW but quickly requests her RW back saying she likes to hold onto something.  Pt tolerated all high level balance activities well but does c/o minor blurriness when weaving around objects in hallway.      Stairs            Wheelchair Mobility    Modified Rankin (Stroke Patients Only)       Balance Overall balance assessment: Needs assistance Sitting-balance support: Feet supported;No upper extremity supported Sitting balance-Leahy Scale: Good     Standing balance support: No upper extremity supported;During functional activity Standing balance-Leahy Scale: Fair Standing balance comment: Able to stand unassisted once up from chair             High level balance activites: Backward walking;Turns;Sudden stops;Head turns;Direction changes;Other (comment) (Figure 8, stepping over object) High Level Balance Comments: Min instability w/ walking backward.  Pt c/o minor "bluriness" during figure 8.    Cognition Arousal/Alertness: Awake/alert Behavior During Therapy: Anxious Overall Cognitive Status: Within Functional Limits for tasks assessed                      Exercises General Exercises - Lower Extremity Hip Flexion/Marching: Strengthening;Both;10 reps;Standing Heel Raises: Strengthening;Both;10 reps;Standing (w/ Bil UE supported) Other Exercises Other Exercises: sit<>stand x7 from chair w/ min guard assist    General Comments        Pertinent Vitals/Pain Pain Assessment: 0-10 Pain Score: 3  (down from 5/10; ambulating keeps her mind off of it) Pain Location: headache Pain Descriptors / Indicators: Headache Pain Intervention(s): Limited activity within patient's tolerance;Monitored during session    Home Living                      Prior Function            PT Goals (current goals can now be found in the care plan section) Acute  Rehab PT Goals Patient Stated Goal: to walk as much as possible PT Goal Formulation: With patient Time For Goal Achievement: 09/01/15 Potential to Achieve Goals: Good Progress towards PT goals: Progressing toward goals    Frequency  Min 3X/week    PT Plan Current plan remains appropriate     Co-evaluation             End of Session   Activity Tolerance: Patient tolerated treatment well Patient left: in chair;with call bell/phone within reach;with chair alarm set     Time: 5364-6803 PT Time Calculation (min) (ACUTE ONLY): 17 min  Charges:  $Gait Training: 8-22 mins                    G Codes:      Michail Jewels PT, [place] [phone] Pager: [phone] 08/27/2015, 10:17 AM

## 2015-08-27 NOTE — Progress Notes (Signed)
Nutrition Follow-up  DOCUMENTATION CODES:   Not applicable  INTERVENTION:    Continue Boost Breeze PO BID, each supplement provides 250 kcal and 9 grams of protein  NUTRITION DIAGNOSIS:   Inadequate oral intake related to poor appetite as evidenced by per patient/family report.  Ongoing  GOAL:   Patient will meet greater than or equal to 90% of their needs  Progressing  MONITOR:   PO intake, Supplement acceptance, I & O's, Labs  ASSESSMENT:   55 y.o. female w/ PMHx of severe MR s/p repair 07/22/15, NICM, chronic combined CHF, presented to [facility] ED after cardiac arrest.  Patient with ongoing orthostatic hypotension. Medication regimen being adjusted before she can be discharged. PO intake seems to be somewhat improved. She is eating 50-100% of meals; eats 2 meals per day. Has been refusing Boost Breeze supplements for the past few days.   Diet Order:  Diet Heart Room service appropriate?: Yes; Fluid consistency:: Thin  Skin:  Reviewed, no issues  Last BM:  2/7  Height:   Ht Readings from Last 1 Encounters:  08/09/15 5\' 2"  (1.575 m)    Weight:   Wt Readings from Last 1 Encounters:  08/27/15 117 lb 8 oz (53.298 kg)   08/20/15 106 lb 6.4 oz (48.263 kg)   08/12/15 115 lb 8.3 oz (52.4 kg)           Ideal Body Weight:  50 kg  BMI:  Body mass index is 21.49 kg/(m^2).  Estimated Nutritional Needs:   Kcal:  1500-1700  Protein:  75-90 grams  Fluid:  > 1.5 L/day  EDUCATION NEEDS:   No education needs identified at this time  Joaquin Courts, RD, LDN, CNSC Pager [phone] After Hours Pager [phone]

## 2015-08-27 NOTE — Progress Notes (Signed)
PATIENT DETAILS Name: Deziree Mokry Age: 55 y.o. Sex: female Date of Birth: 1960/08/18 Admit Date: 08/05/2015 Admitting Physician Lupita Leash, MD [id number] A, MD  Brief narrative: 55 year old female with recent MV repair 07/22/15 for severe mitral regurgitation, PDA, and clipping of Lt atrial appendage. She had cardiac cath 07/08/15 with normal coronary arteries.She was admitted on 08/05/15 with a unwitnessed cardiac arrest-initial records indicate her rhythm was V. tach and PEA. She was successfully resuscitated, admitted to the intensive care unit and started on hypothermia protocol. Upon clinical improvement, she was transferred to the telemetry floor. She has been followed closely by cardiology, has undergone a cardiac MRI and placement of AICD. Hospital course has been complicated by persistent orthostatic hypotension.  Subjective: Appears very weak-tearful and depressed.   Assessment/Plan: Orthostatic hypotension: Improving with initiation of Florinef,Midodrine along with abdominal binder and TED hose. Unfortunately not able to tolerate Mestinon due to GI side effects. ACTH stimulation test, prolactin level, and free T4 are within normal limits. Current suspicion is for autonomic failure. Fortunately, blood pressure much better today. Plans are to continue to observe another 24 hours before making any changes to her medications. Cardiology/EP continues to follow.  Cardiac arrest: V. tach/PEA-initial rhythm. Successfully resuscitated, since then has undergone AICD placement.  Ventilator dependent respiratory failure: In a setting of cardiac arrest. Resolved.  Acute on chronic diastolic heart failure: Compensated-continue to hold diuretics given orthostatics.  Paroxysmal atrial fibrillation: Noted post arrest, continue Coumadin-INR therapeutic.Per EP note-short term anticoagulation, likely d/c at f/u visit  Aspiration pneumonia: Treated with 7 days of  Unasyn. Afebrile/without leukocytosis. Continue to monitor off antibiotics  Left pleural effusion: Suspect secondary to CHF. Initiate diuretics when able.  Depression:will empirically start on Wellbutrin-titrate up depending on clinical response  History of infiltrative cardiomyopathy: SPEP/UPEP negative for M spike. Further workup/biopsy deferred to the outpatient setting.  History of prior syncope and long QT interval: AICD now in place.  History of mitral regurgitation: Status post mitral valve repair, clipping of left atrial appendage and ligation of patent ductus arteriosus on 07/22/15.  Disposition: Remain inpatient-SNF in next few days  Antimicrobial agents  See below  Anti-infectives    Start     Dose/Rate Route Frequency Ordered Stop   08/15/15 2200  ceFAZolin (ANCEF) IVPB 1 g/50 mL premix     1 g 100 mL/hr over 30 Minutes Intravenous Every 6 hours 08/15/15 1823 08/16/15 1008   08/15/15 0500  gentamicin (GARAMYCIN) 80 mg in sodium chloride irrigation 0.9 % 500 mL irrigation     80 mg Irrigation To Cath Lab 08/14/15 1649 08/15/15 1738   08/15/15 0500  ceFAZolin (ANCEF) IVPB 2 g/50 mL premix     2 g 100 mL/hr over 30 Minutes Intravenous To Cath Lab 08/14/15 1649 08/15/15 1647   08/05/15 2300  ampicillin-sulbactam (UNASYN) 1.5 g in sodium chloride 0.9 % 50 mL IVPB     1.5 g 100 mL/hr over 30 Minutes Intravenous Every 6 hours 08/05/15 2240 08/13/15 0110      DVT Prophylaxis: Coumadin  Code Status: Full code   Family Communication None at bedside  Procedures and significant events: 1/17 Admitted for PEA/VT arrest, intubated, placed on hypothermic protocol  1/17 CTA chest with no PO, moderate bilateral pleural effusions and consolidative changes of lower lobes 1/17 Echo with normal EF, mild LVH, mild LAW, trace AI, mild MS, no MR, small pericardial effusion, left  pleural effusion 1/18 EEG with generalized slowing  1/19 Rewarmed, paralytics, sedation turned  off 1/19 CT head normal 1/20 CXR with left sided pleural effusion and b/l pulmonary edema  1/20 INR 9.4 requiring FFP and vitamin K 1/20 IV amio stopped  1/20 EEG with generalized slowing  1/21 Improved mental status, start IV heparin  1/22 Vomited tube feeds, KUB normal  1/23 Thoracentesis of large left effusion with 950 cc semi-hemorrhagic fluid. IV heparin stopped. 1/23 2D-echo with EF 55-60% MV with no significant regurgitation  1/24- extubated 1/27 pacemaker/ICd implantation   CONSULTS:  cardiology and neurology  Time spent 25 minutes-Greater than 50% of this time was spent in counseling, explanation of diagnosis, planning of further management, and coordination of care.  MEDICATIONS: Scheduled Meds: . antiseptic oral rinse  7 mL Mouth Rinse BID  . feeding supplement  1 Container Oral BID BM  . fludrocortisone  0.3 mg Oral BID  . midodrine  10 mg Oral QID  . pantoprazole  40 mg Oral Q1200  . pyridostigmine  30 mg Oral Q12H  . sodium chloride  1 g Oral BID WC  . warfarin  3 mg Oral ONCE-1800  . Warfarin - Pharmacist Dosing Inpatient   Does not apply q1800   Continuous Infusions:  PRN Meds:.acetaminophen, morphine injection, ondansetron (ZOFRAN) IV, ondansetron (ZOFRAN) IV, polyvinyl alcohol, traMADol, white petrolatum    PHYSICAL EXAM: Vital signs in last 24 hours: Filed Vitals:   08/27/15 0515 08/27/15 0748 08/27/15 1047 08/27/15 1134  BP: 89/51  126/81 122/65  Pulse:   88 89  Temp: 98.7 F (37.1 C) 97.9 F (36.6 C) 98.4 F (36.9 C) 98.4 F (36.9 C)  TempSrc: Oral Oral Oral Oral  Resp: 17  18 14   Height:      Weight: 53.298 kg (117 lb 8 oz)     SpO2: 99%  98% 96%    Weight change: 1.95 kg (4 lb 4.8 oz) Filed Weights   08/25/15 0450 08/26/15 0537 08/27/15 0515  Weight: 50.6 kg (111 lb 8.8 oz) 51.347 kg (113 lb 3.2 oz) 53.298 kg (117 lb 8 oz)   Body mass index is 21.49 kg/(m^2).   Gen Exam: Awake and alert with clear speech.  Seems very weak and  lethargic. Neck: Supple, No JVD.  Chest: B/L Clear.   CVS: S1 S2 Regular, no murmurs.  Abdomen: soft, BS +, non tender, non distended.  Extremities: no edema, lower extremities warm to touch Neurologic: Non Focal.   Skin: No Rash.   Wounds: N/A.    Intake/Output from previous day:  Intake/Output Summary (Last 24 hours) at 08/27/15 1305 Last data filed at 08/27/15 0900  Gross per 24 hour  Intake 1326.67 ml  Output    650 ml  Net 676.67 ml     LAB RESULTS: CBC  Recent Labs Lab 08/21/15 0300 08/24/15 0753 08/27/15 0602  WBC 8.3 5.7 7.2  HGB 10.7* 10.6* 10.0*  HCT 32.9* 32.2* 31.3*  PLT 293 282 PLATELET CLUMPS NOTED ON SMEAR, UNABLE TO ESTIMATE  MCV 94.5 95.5 95.4  MCH 30.7 31.5 30.5  MCHC 32.5 32.9 31.9  RDW 14.9 15.6* 15.8*    Chemistries   Recent Labs Lab 08/21/15 0300 08/23/15 1157 08/24/15 0753  NA 136 137 139  K 3.8 4.0 3.7  CL 102 101 102  CO2 26 27 28   GLUCOSE 104* 123* 99  BUN 6 6 <5*  CREATININE 0.43* 0.71 0.48  CALCIUM 9.0 8.9 9.0    CBG: No  results for input(s): GLUCAP in the last 168 hours.  GFR Estimated Creatinine Clearance: 62.1 mL/min (by C-G formula based on Cr of 0.48).  Coagulation profile  Recent Labs Lab 08/23/15 0357 08/24/15 0753 08/25/15 0533 08/26/15 0827 08/27/15 0602  INR 1.63* 2.35* 2.39* 2.19* 2.08*    Cardiac Enzymes No results for input(s): CKMB, TROPONINI, MYOGLOBIN in the last 168 hours.  Invalid input(s): CK  Invalid input(s): POCBNP No results for input(s): DDIMER in the last 72 hours. No results for input(s): HGBA1C in the last 72 hours. No results for input(s): CHOL, HDL, LDLCALC, TRIG, CHOLHDL, LDLDIRECT in the last 72 hours. No results for input(s): TSH, T4TOTAL, T3FREE, THYROIDAB in the last 72 hours.  Invalid input(s): FREET3 No results for input(s): VITAMINB12, FOLATE, FERRITIN, TIBC, IRON, RETICCTPCT in the last 72 hours. No results for input(s): LIPASE, AMYLASE in the last 72  hours.  Urine Studies No results for input(s): UHGB, CRYS in the last 72 hours.  Invalid input(s): UACOL, UAPR, USPG, UPH, UTP, UGL, UKET, UBIL, UNIT, UROB, ULEU, UEPI, UWBC, URBC, UBAC, CAST, UCOM, BILUA  MICROBIOLOGY: No results found for this or any previous visit (from the past 240 hour(s)).  RADIOLOGY STUDIES/RESULTS: Dg Chest 2 View  08/27/2015  CLINICAL DATA:  Increasing shortness of breath over the last few days EXAM: CHEST  2 VIEW COMPARISON:  08/16/2015 FINDINGS: Postsurgical changes are noted. A defibrillator is again seen and stable. Small right-sided pleural effusion is noted. A large left-sided pleural effusion is seen occupying approximately 50% of the left hemi thorax. No focal infiltrate is seen although some underlying atelectasis is likely present in the left base. IMPRESSION: Bilateral pleural effusions left considerably greater than right significantly increased from the prior exam. Electronically Signed   By: Alcide Clever M.D.   On: 08/27/2015 12:47   Dg Chest 2 View  08/16/2015  CLINICAL DATA:  ICD placement.  History of mitral valve replacement. EXAM: CHEST  2 VIEW COMPARISON:  08/15/2015 and prior exams FINDINGS: A left-sided dual lead ICD is now noted with leads overlying the right atrium and right ventricle. There is no evidence of pneumothorax. Cardiomegaly,, cardiac valve replacement, left atrial appendage clip again noted. A small left pleural effusion with left basilar atelectasis and trace right pleural effusion noted. IMPRESSION: Left ICD placement -no evidence of pneumothorax. Small left pleural effusion, left basilar atelectasis and trace right pleural effusion again noted. Electronically Signed   By: Harmon Pier M.D.   On: 08/16/2015 07:18   Dg Chest 2 View  08/15/2015  CLINICAL DATA:  Follow-up pleural effusion EXAM: CHEST  2 VIEW COMPARISON:  08/13/2015 FINDINGS: Right pleural effusion improved. Small left pleural effusion is stable. No pneumothorax.  Postoperative changes are stable. Left jugular venous catheter removed. Hyperaeration. Minimal atelectasis at the left base. IMPRESSION: Improved right pleural effusion. Stable left pleural effusion. No pneumothorax. Electronically Signed   By: Jolaine Click M.D.   On: 08/15/2015 10:36   Dg Chest 2 View  07/31/2015  CLINICAL DATA:  Worsening shortness of breath, cough, and peripheral edema. 9 days postop from mitral valve replacement. EXAM: CHEST  2 VIEW COMPARISON:  07/25/2015 FINDINGS: Increased moderate left pleural effusion is seen. Small right pleural effusion shows no significant change. There is increased atelectasis or infiltrate in the left lower lung. No pneumothorax visualized. Previous median sternotomy and mitral valve replacement noted. IMPRESSION: Increased moderate left pleural effusion and left lower lobe atelectasis versus infiltrate. No significant change in small right pleural effusion. Electronically  Signed   By: Myles Rosenthal M.D.   On: 07/31/2015 15:51   Ct Head Wo Contrast  08/07/2015  CLINICAL DATA:  Status post cardiac arrest. History of CHF, aortic aneurysm. EXAM: CT HEAD WITHOUT CONTRAST TECHNIQUE: Contiguous axial images were obtained from the base of the skull through the vertex without intravenous contrast. COMPARISON:  MRI of the brain January 14, 2015 FINDINGS: The ventricles and sulci are normal. No intraparenchymal hemorrhage, mass effect nor midline shift. No acute large vascular territory infarcts. No abnormal extra-axial fluid collections. Basal cisterns are patent. No skull fracture. Subcentimeter calcification LEFT periorbital soft tissues. The included ocular globes and orbital contents are non-suspicious. The mastoid aircells and included paranasal sinuses are well-aerated. IMPRESSION: Negative CT head. Electronically Signed   By: Awilda Metro M.D.   On: 08/07/2015 23:36   Ct Angio Chest Pe W/cm &/or Wo Cm  08/05/2015  CLINICAL DATA:  55 year old female with  shortness of breath. Status post CPR. EXAM: CT ANGIOGRAPHY CHEST WITH CONTRAST TECHNIQUE: Multidetector CT imaging of the chest was performed using the standard protocol during bolus administration of intravenous contrast. Multiplanar CT image reconstructions and MIPs were obtained to evaluate the vascular anatomy. CONTRAST:  80mL OMNIPAQUE IOHEXOL 350 MG/ML SOLN COMPARISON:  Radiograph dated 08/05/2015 FINDINGS: An endotracheal tube is noted with tip at the carina tilting towards the right mainstem bronchus. Recommend retraction and repositioning by approximately 3- 4 cm. Moderate bilateral pleural effusions. There is complete consolidation of the lower lobes bilaterally with air bronchograms. There is partial consolidative changes of the left upper lobe and lingula. There is ground-glass opacity within the lingula. Findings may represent pneumonia, or pulmonary edema or ARDS. Clinical correlation is recommended. There is no pneumothorax. The visualized thoracic aorta appears unremarkable. No CT evidence of pulmonary embolism. Top-normal cardiac size. Mechanical mitral valve. Small pockets of air noted within the right atrium, likely iatrogenic from IV injection. There is retrograde flow of contrast from the right atrium into the IVC compatible with a degree of right cardiac dysfunction. No significant pericardial effusion. Small amount of fluid along the left aortic arch in the upper mediastinum may represent complex fluid or small amount of blood/serosanguineous fluid. Small pockets of air noted within this fluid in the upper mediastinum compatible with small pneumomediastinum. An enteric tube is partially visualized extending into the upper abdomen with tip not included in the CT. There is no axillary adenopathy. There is diffuse subcutaneous soft tissue edema and anasarca. Mild degenerative changes of the spine. Median sternotomy wires age indeterminate fracture of the left first rib, new from prior study  possibly acute or subacute. No other acute fracture identified. Review of the MIP images confirms the above findings. IMPRESSION: No CT evidence of pulmonary embolism. Moderate bilateral pleural effusions with complete consolidative changes of the lower lobes and partial consolidative changes of the left upper lobe. Clinical correlation is recommended. Endotracheal tube the tip at the origin of the right mainstem bronchus. Recommend retraction and repositioning by approximately 3-4 cm. Small high attenuating fluid along the left aspect of the aortic arch may represent complex fluid or small amount of blood. These results were called by telephone at the time of interpretation on 08/05/2015 at 10:59 pm to Dr. Marijean Niemann , who verbally acknowledged these results. Electronically Signed   By: Elgie Collard M.D.   On: 08/05/2015 23:04   Dg Chest Port 1 View  08/13/2015  CLINICAL DATA:  Shortness of breath.  Pleural effusion. EXAM: PORTABLE CHEST 1  VIEW COMPARISON:  08/12/2015. FINDINGS: Interim extubation removal of NG tube. Left IJ line in stable position. Prior CABG and cardiac valve replacement. Left atrial appendage clip noted. Stable mild cardiomegaly. No pulmonary venous congestion. Low lung volumes with bibasilar atelectasis and/or bilateral infiltrates/edema. Small bilateral pleural effusions. IMPRESSION: 1. Interim extubation removal of NG tube. Left IJ line in stable position. 2. Prior CABG and cardiac valve replacement. Left atrial appendage clip noted. Stable cardiomegaly. No pulmonary venous congestion. 3. Low lung volumes with persistent bibasilar atelectasis and/or pulmonary infiltrates/edema. Persistent bilateral pleural effusions. No interim change. Electronically Signed   By: Maisie Fus  Register   On: 08/13/2015 07:06   Dg Chest Port 1 View  08/12/2015  CLINICAL DATA:  Endotracheal tube.  Shortness of breath. EXAM: PORTABLE CHEST 1 VIEW COMPARISON:  08/11/2015 FINDINGS: Endotracheal tube with tip  measuring 4.5 cm above the carina. Enteric tube tip is off the field of view but below the left hemidiaphragm. Left central venous catheter with tip over the low SVC region. Normal heart size and pulmonary vascularity. Shallow inspiration. Small bilateral pleural effusions with basilar atelectasis or infiltration. No definite change since previous study, allowing for differences in technique. Postoperative changes in the mediastinum. IMPRESSION: Appliances appear to be in satisfactory location. Small bilateral pleural effusions with basilar atelectasis or infiltration. Electronically Signed   By: Burman Nieves M.D.   On: 08/12/2015 03:39   Dg Chest Port 1 View  08/11/2015  CLINICAL DATA:  Status post left-sided thoracentesis EXAM: PORTABLE CHEST 1 VIEW COMPARISON:  Study obtained earlier in the day FINDINGS: Left effusion is smaller following thoracentesis. No pneumothorax. Endotracheal tube tip is 3.6 cm above the carina. Central catheter tip is in the superior vena cava. Nasogastric tube tip and side port are below the diaphragm in the stomach. There remains a fairly small left effusion. There is also a small right effusion. There is moderate atelectatic change in the left base and medial right base regions. Heart is mildly enlarged. Patient has a left atrial appendage clamp. The patient is status post mitral valve replacement. IMPRESSION: Left effusion smaller following thoracentesis. No pneumothorax. There is a small left effusion with small right effusion is well. Bibasilar atelectatic change, more on the left than on the right. Stable cardiac silhouette. Tube and catheter positions as described without pneumothorax. Electronically Signed   By: Bretta Bang III M.D.   On: 08/11/2015 13:02   Dg Chest Port 1 View  08/11/2015  CLINICAL DATA:  Ventilator.  Mitral valve repair. EXAM: PORTABLE CHEST 1 VIEW COMPARISON:  08/10/2015 FINDINGS: Changes of median sternotomy and valve replacement. Bilateral  pleural effusions, left greater than right, likely slightly improved since prior study. Bilateral airspace opacities, most pronounced in the the lower lobes, also greater on the left with slight improvement in aeration since prior study. No pneumothorax. No acute bony abnormality. IMPRESSION: Moderate to large bilateral pleural effusions and bilateral airspace disease, both greater on the left. This is improved slightly since prior study. Support devices stable. Electronically Signed   By: Charlett Nose M.D.   On: 08/11/2015 07:12   Dg Chest Port 1 View  08/10/2015  CLINICAL DATA:  Endotracheally intubated. Pt currently on lasix and diuresed about 4 liters of fluid from chest. Checking on fluid to be able to extubate tomorrow. EXAM: PORTABLE CHEST 1 VIEW COMPARISON:  08/10/2015 FINDINGS: Lines and tubes unchanged. Large left pleural effusion unchanged. Moderate right pleural effusion unchanged. Vascular congestion stable. Bilateral airspace opacity presumably represent the compressive  atelectasis as the result of the pleural fluid stable. IMPRESSION: Radiographically no change from prior study Electronically Signed   By: Esperanza Heir M.D.   On: 08/10/2015 14:12   Dg Chest Port 1 View  08/10/2015  CLINICAL DATA:  Pleural effusion. EXAM: PORTABLE CHEST 1 VIEW COMPARISON:  08/09/2015 FINDINGS: Endotracheal tube, enteric catheter, left IJ approach central venous catheter are stable. Median sternotomy wires also seen. Cardiomediastinal silhouette is partially obscured by dense lower lobe airspace opacities and bilateral pleural effusions. Reticular opacities are seen in the upper lobes. There is no evidence of focal airspace consolidation, pleural effusion or pneumothorax. Osseous structures are without acute abnormality. Soft tissues are grossly normal. IMPRESSION: Pulmonary edema with bilateral pleural effusions. More confluent airspace consolidation in bilateral lung bases may represent areas of subsegmental  atelectasis. Electronically Signed   By: Ted Mcalpine M.D.   On: 08/10/2015 08:47   Dg Chest Port 1 View  08/09/2015  CLINICAL DATA:  Check endotracheal tube placement EXAM: PORTABLE CHEST - 1 VIEW COMPARISON:  08/08/2015 FINDINGS: An endotracheal tube is again seen approximately 6 cm above the carina. A left jugular central line is noted at the cavoatrial junction. Nasogastric catheter extends into the stomach. Postsurgical changes are seen. Left-sided pleural effusion is again noted. Patchy infiltrates are again seen likely related to edema but stable. No new focal abnormality is noted. IMPRESSION: Tubes and lines as described. Left-sided pleural effusion and bilateral pulmonary edema stable in appearance. Electronically Signed   By: Alcide Clever M.D.   On: 08/09/2015 08:05   Dg Chest Port 1 View  08/08/2015  CLINICAL DATA:  Cardiac arrest EXAM: PORTABLE CHEST 1 VIEW COMPARISON:  08/07/2015 FINDINGS: Endotracheal tube in good position. Central venous catheter tip in the SVC unchanged. NG tube in the stomach. Moderately large left effusion with left lower lobe airspace disease unchanged. Smaller right effusion with right lower lobe airspace disease unchanged. Diffuse bilateral airspace disease may represent edema and has progressed. IMPRESSION: Progressive bilateral airspace disease most likely due to edema. No change in bilateral pleural effusions left greater than right with compressive atelectasis in the bases. Endotracheal tube remains in good position. Electronically Signed   By: Marlan Palau M.D.   On: 08/08/2015 08:06   Dg Chest Port 1 View  08/07/2015  CLINICAL DATA:  Heart failure. EXAM: PORTABLE CHEST 1 VIEW COMPARISON:  08/05/2015. FINDINGS: Endotracheal tube, NG tube, left IJ line in stable position. Prior cardiac valve replacement. Left atrial appendage clip noted. Partial clearing of bilateral pulmonary infiltrates/edema. Persistent but slightly improved bilateral pleural effusions.  No pneumothorax . IMPRESSION: 1. Lines and tubes in stable position. 2. Partial clearing of bilateral pulmonary edema and pleural effusions. 3. Prior cardiac valve replacement.  Stable cardiomegaly. Electronically Signed   By: Maisie Fus  Register   On: 08/07/2015 07:32   Dg Chest Port 1 View  08/05/2015  CLINICAL DATA:  Central line placement.  Initial encounter. EXAM: PORTABLE CHEST 1 VIEW COMPARISON:  Chest radiograph performed earlier today at 7:10 p.m., and CTA of the chest performed earlier today at 10:15 p.m. FINDINGS: The left IJ line is noted ending about the distal SVC. The patient's endotracheal tube is noted ending 2-3 cm above the carina. An enteric tube is noted extending below the diaphragm. Vascular congestion is again noted. Note is made of small right and small to moderate left pleural effusions, and likely underlying pulmonary edema. No pneumothorax is seen. The cardiomediastinal silhouette is mildly enlarged. The patient is status post  median sternotomy. A mitral valve replacement is noted. No acute osseous abnormalities are identified. IMPRESSION: 1. Left IJ line noted ending about the distal SVC. 2. Endotracheal tube noted ending 2-3 cm above the carina. 3. Vascular congestion and mild cardiomegaly. Small right and small to moderate left pleural effusions, with likely underlying pulmonary edema. Electronically Signed   By: Roanna Raider M.D.   On: 08/05/2015 23:50   Dg Chest Portable 1 View  08/05/2015  CLINICAL DATA:  Status post CPR. Endotracheal tube placement. Initial encounter. EXAM: PORTABLE CHEST 1 VIEW COMPARISON:  Chest radiograph performed 07/31/2015 FINDINGS: The patient's endotracheal tube is noted ending overlying the right mainstem bronchus, 1 cm below the carina. This should be retracted 4 cm. Diffuse bilateral airspace opacification is noted, with a small to moderate left-sided pleural effusion. This is concerning for pulmonary edema, though pneumonia cannot be excluded. No  pneumothorax is seen. The cardiomediastinal silhouette is mildly enlarged. The patient is status post median sternotomy. External pacing pads are noted. No acute osseous abnormalities are seen. IMPRESSION: 1. Endotracheal tube noted ending overlying the right mainstem bronchus, 1 cm below the carina. This should be retracted 4 cm. 2. Diffuse bilateral airspace opacification, with a small to moderate left-sided pleural effusion. This is concerning for pulmonary edema, though pneumonia cannot be excluded. 3. Mild cardiomegaly. These results were called by telephone at the time of interpretation on 08/05/2015 at 9:20 pm to Nursing at the [facility] ER, who verbally acknowledged these results. The endotracheal tube had already been repositioned. Electronically Signed   By: Roanna Raider M.D.   On: 08/05/2015 21:50   Dg Abd Portable 1v  08/10/2015  CLINICAL DATA:  Vomiting tube feedings, orogastric tube placement EXAM: PORTABLE ABDOMEN - 1 VIEW COMPARISON:  None. FINDINGS: Orogastric tube projects just to the right of midline over the anticipated position of the distal body of the stomach. Nonobstructive gas pattern. Rectal thermometer noted. IMPRESSION: OG tube as described Electronically Signed   By: Esperanza Heir M.D.   On: 08/10/2015 17:43   Mr Card Morphology Wo/w Cm  08/14/2015  CLINICAL DATA:  Cardiac Arrest, Long QT, Cardiomyopathy EXAM: CARDIAC MRI TECHNIQUE: The patient was scanned on a 1.5 Tesla GE magnet. A dedicated cardiac coil was used. Functional imaging was done using Fiesta sequences. 2,3, and 4 chamber views were done to assess for RWMA's. Modified Simpson's rule using a short axis stack was used to calculate an ejection fraction on a dedicated work [employer], [employer]. The patient received 20 cc of Multihance. After 10 minutes inversion recovery sequences were used to assess for infiltration and scar tissue. CONTRAST:  20 cc Multihance FINDINGS: There was mild LAE. The RA and RV were  normal. The LV was mildly enlarged. There was mildly asymmetric hypertrophy of the septum Septum measures 15 mm and posterior wall 10 mm. There is no SAM. The patient is s/p mitral repair with an annuloplasty ring. There is no significant residual MR with mild diastolic bowing. The LAA has been clipped and has no flow. The aortic valve is normal as is the aortic root There is no residual PDA identified. There is a small pericardial effusion and large left pleural effusion. The distal septum and apex appear hypokinetic. The quantitative EF is 45% (EDV 70 cc, ESV 39 cc SV 31 cc) There is a markedly abnormal gadolinium uptake in the LV myocardium on inversion recovery sequences. This uptake is diffuse in the endocardial and mid epicardial areas. Although uptake is  diffuse it is especially marked in the inferior wall. IMPRESSION: 1) Mildly asymmetric septal hypertrophy 15 mm. Distal septal apical hypokinesis EF 45% 2) Diffuse delayed gadolinium uptake in the subendocardial and mid epicardial areas especially marked in the inferior wall 3) S/P mitral valve repair with no residual MR and annuloplasty ring 4) Atri-Clip of the LAA with no flow 5) No residual PDA flow 6) Mild LAE 7) Small pericardial effusion 8) Large left pleural effusion Note delayed gadolinium findings may be consistent with myocarditis vs infiltrative cardiomyopathy Charlton Haws Electronically Signed   By: Charlton Haws M.D.   On: 08/14/2015 20:35    Jeoffrey Massed, MD  [facility] Pager:[phone]  If 7PM-7AM, please contact night-coverage [email or web address] Password [email or web address] 08/27/2015, 1:05 PM   LOS: 22 days

## 2015-08-27 NOTE — Progress Notes (Signed)
SUBJECTIVE: The patient is stable today.  Walked without lighthheadedness, but nauseated on mestinon so have to stop Also tearful and "depressed"  BP this am all over 90  Yeah!!! CURRENT MEDICATIONS: . antiseptic oral rinse  7 mL Mouth Rinse BID  . feeding supplement  1 Container Oral BID BM  . fludrocortisone  0.3 mg Oral BID  . midodrine  10 mg Oral QID  . pantoprazole  40 mg Oral Q1200  . pyridostigmine  30 mg Oral Q12H  . sodium chloride  1 g Oral BID WC  . warfarin  3 mg Oral ONCE-1800  . Warfarin - Pharmacist Dosing Inpatient   Does not apply q1800      OBJECTIVE: Physical Exam: Filed Vitals:   08/26/15 2100 08/27/15 0000 08/27/15 0515 08/27/15 0748  BP: 141/63 134/68 89/51   Pulse: 68 92    Temp: 97.3 F (36.3 C) 98.4 F (36.9 C) 98.7 F (37.1 C) 97.9 F (36.6 C)  TempSrc: Oral Oral Oral Oral  Resp: 20 16 17    Height:      Weight:   117 lb 8 oz (53.298 kg)   SpO2: 100% 100% 99%     Intake/Output Summary (Last 24 hours) at 08/27/15 0941 Last data filed at 08/27/15 0500  Gross per 24 hour  Intake 2112.92 ml  Output    950 ml  Net 1162.92 ml    Telemetry reveals sinus rhythm  GEN- The patient is thin appearing, alert and oriented x 3 today.   Head- normocephalic, atraumatic Eyes-  Sclera clear, conjunctiva pink Ears- hearing intact Oropharynx- clear Neck- supple  Lungs- Clear to ausculation bilaterally, normal work of breathing Heart- Regular rate and rhythm  GI- soft, NT, ND, + BS, +binder Extremities- no clubbing, cyanosis, or edema Skin- no rash or lesion Psych- euthymic mood, full affect Neuro- strength and sensation are intact  LABS: Basic Metabolic Panel: No results for input(s): NA, K, CL, CO2, GLUCOSE, BUN, CREATININE, CALCIUM, MG, PHOS in the last 72 hours. CBC:  Recent Labs  08/27/15 0602  WBC 7.2  HGB 10.0*  HCT 31.3*  MCV 95.4  PLT PLATELET CLUMPS NOTED ON SMEAR, UNABLE TO ESTIMATE    RADIOLOGY: Dg Chest 2 View  08/15/2015  CLINICAL DATA:  Follow-up pleural effusion EXAM: CHEST  2 VIEW COMPARISON:  08/13/2015 FINDINGS: Right pleural effusion improved. Small left pleural effusion is stable. No pneumothorax. Postoperative changes are stable. Left jugular venous catheter removed. Hyperaeration. Minimal atelectasis at the left base. IMPRESSION: Improved right pleural effusion. Stable left pleural effusion. No pneumothorax. Electronically Signed   By: Jolaine Click M.D.   On: 08/15/2015 10:36   Mr Card Morphology Wo/w Cm 08/14/2015  CLINICAL DATA:  Cardiac Arrest, Long QT, Cardiomyopathy EXAM: CARDIAC MRI TECHNIQUE: The patient was scanned on a 1.5 Tesla GE magnet. A dedicated cardiac coil was used. Functional imaging was done using Fiesta sequences. 2,3, and 4 chamber views were done to assess for RWMA's. Modified Simpson's rule using a short axis stack was used to calculate an ejection fraction on a dedicated work [employer] officer, [employer]. The patient received 20 cc of Multihance. After 10 minutes inversion recovery sequences were used to assess for infiltration and scar tissue. CONTRAST:  20 cc Multihance FINDINGS: There was mild LAE. The RA and RV were normal. The LV was mildly enlarged. There was mildly asymmetric hypertrophy of the septum Septum measures 15 mm and posterior wall 10 mm. There is no SAM. The patient is  s/p mitral repair with an annuloplasty ring. There is no significant residual MR with mild diastolic bowing. The LAA has been clipped and has no flow. The aortic valve is normal as is the aortic root There is no residual PDA identified. There is a small pericardial effusion and large left pleural effusion. The distal septum and apex appear hypokinetic. The quantitative EF is 45% (EDV 70 cc, ESV 39 cc SV 31 cc) There is a markedly abnormal gadolinium uptake in the LV myocardium on inversion recovery sequences. This uptake is diffuse in the endocardial and mid epicardial areas. Although uptake is diffuse it  is especially marked in the inferior wall. IMPRESSION: 1) Mildly asymmetric septal hypertrophy 15 mm. Distal septal apical hypokinesis EF 45% 2) Diffuse delayed gadolinium uptake in the subendocardial and mid epicardial areas especially marked in the inferior wall 3) S/P mitral valve repair with no residual MR and annuloplasty ring 4) Atri-Clip of the LAA with no flow 5) No residual PDA flow 6) Mild LAE 7) Small pericardial effusion 8) Large left pleural effusion Note delayed gadolinium findings may be consistent with myocarditis vs infiltrative cardiomyopathy Charlton Haws Electronically Signed   By: Charlton Haws M.D.   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  Cardiac arrest    2.  Orthostatic hypotension    3.  MR s/p MVR    4.  Paroxysmal atrial fibrillation  5>  Decreased BS L Lung   6. Melancholia,depression    I remain perplexed as to the cause of her autonomic insufficiency. I have reached out to Dr Berneice Gandy at [facility]  Hopefully I will hear back.. Prolactin and T4 levels speak against pituitarty failure, and thus the ACTH stimulation data are presumably normal   We await  nACHR antibodies and ask neuro to see to look for further evidence of autonomic failure;  It may also be worth measuring norepinephrine response to standing   I have found no links between cardiac surgery and orthostasis, and next to nothing on arrest and  orthostasis  There may be a role for Northera used in orthostasis that in neurogenic, which this appears to be without HR increases, but todays VS are very encouraging   We unfortunately have to stop mestinon ascribing to it the nausea It may be the salt tablet too, so will see how the day unfolds  Check CXR  Spoke with Dr Reece Agar today re plans   She needs an antidpressant

## 2015-08-27 NOTE — Progress Notes (Signed)
Occupational Therapy Treatment Patient Details Name: Diane [name] MRN: [id number] DOB: 12/13/1960 Today's Date: 08/27/2015    History of present illness Underwent MVR in January 2017. 08/05/15 admitted for PEA/VT arrest, intubated, placed on hypothermic protocol. Thoracentesis 1/23. Extubated 1/24. Pacemaker/ICD implantation 1/27.    OT comments  Pt without symptoms of hypotension this visit with abdominal binder and compression hose.  Requiring supervision to min guard assist for dressing, toileting, grooming at sink.  Pt eager to walk with PT, states she feels her legs are strong.  Follow Up Recommendations  SNF;Supervision/Assistance - 24 hour    Equipment Recommendations  Tub/shower seat    Recommendations for Other Services      Precautions / Restrictions Precautions Precautions: Sternal;ICD/Pacemaker;Fall Precaution Comments: orthostatic, has TED hose and abdominal binder Restrictions Weight Bearing Restrictions: No       Mobility Bed Mobility   Bed Mobility: Supine to Sit     Supine to sit: Supervision     General bed mobility comments: no physical assist, HOB up, used rail  Transfers Overall transfer level: Needs assistance Equipment used: Rolling walker (2 wheeled) Transfers: Sit to/from Stand Sit to Stand: Min guard         General transfer comment: no dizziness, cued to stand momentarily before walking    Balance Overall balance assessment: Needs assistance Sitting-balance support: Feet supported;No upper extremity supported Sitting balance-Leahy Scale: Good     Standing balance support: No upper extremity supported;During functional activity Standing balance-Leahy Scale: Fair Standing balance comment: Able to stand unassisted once up from chair                ADL Overall ADL's : Needs assistance/impaired     Grooming: Wash/dry hands;Supervision/safety;Standing           Upper Body Dressing : Set up;Sitting Upper Body Dressing  Details (indicate cue type and reason): pajama jacket Lower Body Dressing: Sit to/from stand;Min guard Lower Body Dressing Details (indicate cue type and reason): able to don socks crossing foot over opposite knee, supervision for safety with standing Toilet Transfer: Min guard;Ambulation   Toileting- Clothing Manipulation and Hygiene: Sit to/from stand;Min guard       Functional mobility during ADLs: Min guard;Rolling walker General ADL Comments: pt anxious without use of walker      Vision                     Perception     Praxis      Cognition   Behavior During Therapy: Anxious Overall Cognitive Status: Within Functional Limits for tasks assessed                       Extremity/Trunk Assessment               Exercises    Shoulder Instructions       General Comments      Pertinent Vitals/ Pain       Pain Assessment: 0-10 Pain Score: 5  Pain Location: head Pain Descriptors / Indicators: Aching Pain Intervention(s): Patient requesting pain meds-RN notified  Home Living                                          Prior Functioning/Environment              Frequency Min 2X/week     Progress Toward Goals  OT Goals(current goals can now be found in the care plan section)  Progress towards OT goals: Progressing toward goals  Acute Rehab OT Goals Patient Stated Goal: to walk as much as possible  Plan Discharge plan remains appropriate    Co-evaluation                 End of Session Equipment Utilized During Treatment: Gait belt;Rolling walker   Activity Tolerance Patient tolerated treatment well   Patient Left in chair;with chair alarm set;with family/visitor present (PT present)   Nurse Communication          Time: 8469-6295 OT Time Calculation (min): 14 min  Charges: OT General Charges $OT Visit: 1 Procedure OT Treatments $Self Care/Home Management : 8-22 mins  Evern Bio 08/27/2015, 10:43 AM  [phone]

## 2015-08-28 ENCOUNTER — Inpatient Hospital Stay (HOSPITAL_COMMUNITY): Payer: BLUE CROSS/BLUE SHIELD

## 2015-08-28 ENCOUNTER — Encounter: Payer: BLUE CROSS/BLUE SHIELD | Admitting: Physician Assistant

## 2015-08-28 ENCOUNTER — Ambulatory Visit: Payer: BLUE CROSS/BLUE SHIELD

## 2015-08-28 DIAGNOSIS — J90 Pleural effusion, not elsewhere classified: Secondary | ICD-10-CM | POA: Insufficient documentation

## 2015-08-28 LAB — GRAM STAIN

## 2015-08-28 LAB — BODY FLUID CELL COUNT WITH DIFFERENTIAL
LYMPHS FL: 74 %
Monocyte-Macrophage-Serous Fluid: 16 % — ABNORMAL LOW (ref 50–90)
NEUTROPHIL FLUID: 10 % (ref 0–25)
WBC FLUID: 727 uL (ref 0–1000)

## 2015-08-28 LAB — PROTEIN, BODY FLUID: Total protein, fluid: <3 g/dL

## 2015-08-28 LAB — GLUCOSE, SEROUS FLUID: Glucose, Fluid: 112 mg/dL

## 2015-08-28 LAB — PROTIME-INR
INR: 2.49 — AB (ref 0.00–1.49)
PROTHROMBIN TIME: 26.6 s — AB (ref 11.6–15.2)

## 2015-08-28 MED ORDER — MIDODRINE HCL 10 MG PO TABS: 10.0000 mg | ORAL_TABLET | Freq: Three times a day (TID) | ORAL | Status: DC

## 2015-08-28 MED ORDER — MIDODRINE HCL 5 MG PO TABS
10.0000 mg | ORAL_TABLET | Freq: Three times a day (TID) | ORAL | Status: DC
  Administered 2015-08-28 – 2015-08-29 (×4): 10 mg via ORAL
  Filled 2015-08-28 (×5): qty 2

## 2015-08-28 MED ORDER — BUPROPION HCL 75 MG PO TABS: 37.5000 mg | ORAL_TABLET | Freq: Two times a day (BID) | ORAL | Status: DC

## 2015-08-28 MED ORDER — FLUDROCORTISONE ACETATE 0.1 MG PO TABS
0.2000 mg | ORAL_TABLET | Freq: Two times a day (BID) | ORAL | Status: DC
  Administered 2015-08-28 – 2015-08-29 (×3): 0.2 mg via ORAL
  Filled 2015-08-28 (×4): qty 2

## 2015-08-28 MED ORDER — PANTOPRAZOLE SODIUM 40 MG PO TBEC: 40.0000 mg | DELAYED_RELEASE_TABLET | Freq: Every day | ORAL | Status: DC

## 2015-08-28 MED ORDER — SODIUM CHLORIDE 1 G PO TABS: 1.0000 g | ORAL_TABLET | Freq: Two times a day (BID) | ORAL | Status: DC

## 2015-08-28 MED ORDER — LIDOCAINE HCL (PF) 1 % IJ SOLN
INTRAMUSCULAR | Status: AC
  Filled 2015-08-28: qty 10

## 2015-08-28 MED ORDER — TRAMADOL HCL 50 MG PO TABS: 50.0000 mg | ORAL_TABLET | Freq: Three times a day (TID) | ORAL | Status: DC | PRN

## 2015-08-28 MED ORDER — WARFARIN SODIUM 2 MG PO TABS
2.0000 mg | ORAL_TABLET | Freq: Every day | ORAL | Status: DC
  Administered 2015-08-28: 2 mg via ORAL
  Filled 2015-08-28: qty 1

## 2015-08-28 MED ORDER — FLUDROCORTISONE ACETATE 0.1 MG PO TABS: 0.2000 mg | ORAL_TABLET | Freq: Two times a day (BID) | ORAL | Status: DC

## 2015-08-28 MED ORDER — BOOST / RESOURCE BREEZE PO LIQD: 1.0000 | Freq: Two times a day (BID) | ORAL | Status: DC

## 2015-08-28 MED ORDER — MIDODRINE HCL 5 MG PO TABS
10.0000 mg | ORAL_TABLET | Freq: Three times a day (TID) | ORAL | Status: DC
  Administered 2015-08-28: 10 mg via ORAL
  Filled 2015-08-28: qty 2

## 2015-08-28 NOTE — Clinical Social Work Note (Signed)
Patient has decided to DC home instead of SNF. [facility] updated. CSW signing off.   Roddie Mc MSW, [place], [place], [phone]

## 2015-08-28 NOTE — Progress Notes (Signed)
ANTICOAGULATION CONSULT NOTE - Follow up Consult  Pharmacy Consult for Warfarin Indication: atrial fibrillation  Allergies  Allergen Reactions  . Vicodin [Hydrocodone-Acetaminophen] Other (See Comments)    "she feels out of it", hallucinating  . Adhesive [Tape] Itching and Rash  . Silicone Itching and Rash    TAPE ALLERGY/EKG STICKER ALLERGY, Please use "paper" tape only    Patient Measurements: Height: 5\' 2"  (157.5 cm) Weight: 115 lb (52.164 kg) IBW/kg (Calculated) : 50.1  Vital Signs: Temp: 98.5 F (36.9 C) (02/09 0716) Temp Source: Oral (02/09 0716) BP: 124/54 mmHg (02/09 0555) Pulse Rate: 95 (02/09 0555)  Labs:  Recent Labs  08/26/15 0827 08/27/15 0602 08/28/15 0708  HGB  --  10.0*  --   HCT  --  31.3*  --   PLT  --  PLATELET CLUMPS NOTED ON SMEAR, UNABLE TO ESTIMATE  --   LABPROT 24.2* 23.2* 26.6*  INR 2.19* 2.08* 2.49*    Estimated Creatinine Clearance: 62.1 mL/min (by C-G formula based on Cr of 0.48).   Medical History: Past Medical History  Diagnosis Date  . Severe mitral regurgitation 07/03/2015  . Chronic combined systolic and diastolic CHF (congestive heart failure) (HCC) 07/03/2015    Grade 3 diastolic dysfunction.  LVEF 40-45% 05/2015.  Marland Kitchen Aortic aneurysm (HCC)   . Patent ductus arteriosus 07/16/2015  . Depression     SOME DEPRESSION (ON MEDS FOR 2 YRS) WHEN HER MOTHER PASSED  . 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 Pt was on warfarin PTA for MVR. Was on heparin during this admission initially. Heparin stopped prior to ICD implant 1/27. Per EP note, Resume warfarin for short-term. Per Dr. Gwenlyn Perking, ok to start warfarin with no bridging. MVR, ligation PDA, LAA clipping on 07/22/15  PTA dose: warfarin 2mg   daily Restarted warfarin 1/29  INR therapeutic at 2.49  Goal of Therapy:  INR 2-3 Monitor platelets by anticoagulation protocol: Yes   Plan:  Resume home dose of Coumadin 2 mg daily  Daily PT/INR Monitor for s/sx of bleeding  Vinnie Level, PharmD., BCPS Clinical Pharmacist Phone [phone]

## 2015-08-28 NOTE — Progress Notes (Signed)
SUBJECTIVE: The patient is stable today.  Walked without lighthheadedness, Marked improvement in BP overnight   CURRENT MEDICATIONS: . antiseptic oral rinse  7 mL Mouth Rinse BID  . buPROPion  37.5 mg Oral BID  . feeding supplement  1 Container Oral BID BM  . fludrocortisone  0.3 mg Oral BID  . midodrine  10 mg Oral QID  . pantoprazole  40 mg Oral Q1200  . pyridostigmine  30 mg Oral Q12H  . sodium chloride  1 g Oral BID WC  . Warfarin - Pharmacist Dosing Inpatient   Does not apply q1800      OBJECTIVE: Physical Exam: Filed Vitals:   08/27/15 1134 08/27/15 2110 08/28/15 0555 08/28/15 0716  BP: 122/65 125/67 124/54   Pulse: 89 90 95   Temp: 98.4 F (36.9 C) 98.4 F (36.9 C) 98.8 F (37.1 C) 98.5 F (36.9 C)  TempSrc: Oral Oral Oral Oral  Resp: 14     Height:      Weight:   115 lb (52.164 kg)   SpO2: 96% 98% 95% 98%    Intake/Output Summary (Last 24 hours) at 08/28/15 0815 Last data filed at 08/27/15 2012  Gross per 24 hour  Intake    460 ml  Output    300 ml  Net    160 ml    Telemetry reveals sinus rhythm  GEN- The patient is thin appearing, alert and oriented x 3 today.   Head- normocephalic, atraumatic Eyes-  Sclera clear, conjunctiva pink Ears- hearing intact Oropharynx- clear Neck- supple  Lungs- Clear to ausculation bilaterally, normal work of breathing Heart- Regular rate and rhythm  GI- soft, NT, ND, + BS, +binder Extremities- no clubbing, cyanosis, or edema Skin- no rash or lesion Psych- euthymic mood, full affect Neuro- strength and sensation are intact  LABS: Basic Metabolic Panel: No results for input(s): NA, K, CL, CO2, GLUCOSE, BUN, CREATININE, CALCIUM, MG, PHOS in the last 72 hours. CBC:  Recent Labs  08/27/15 0602  WBC 7.2  HGB 10.0*  HCT 31.3*  MCV 95.4  PLT PLATELET CLUMPS NOTED ON SMEAR, UNABLE TO ESTIMATE    RADIOLOGY: Dg Chest 2 View 08/15/2015  CLINICAL DATA:  Follow-up pleural effusion EXAM: CHEST  2 VIEW  COMPARISON:  08/13/2015 FINDINGS: Right pleural effusion improved. Small left pleural effusion is stable. No pneumothorax. Postoperative changes are stable. Left jugular venous catheter removed. Hyperaeration. Minimal atelectasis at the left base. IMPRESSION: Improved right pleural effusion. Stable left pleural effusion. No pneumothorax. Electronically Signed   By: Jolaine Click M.D.   On: 08/15/2015 10:36   Mr Card Morphology Wo/w Cm 08/14/2015  CLINICAL DATA:  Cardiac Arrest, Long QT, Cardiomyopathy EXAM: CARDIAC MRI TECHNIQUE: The patient was scanned on a 1.5 Tesla GE magnet. A dedicated cardiac coil was used. Functional imaging was done using Fiesta sequences. 2,3, and 4 chamber views were done to assess for RWMA's. Modified Simpson's rule using a short axis stack was used to calculate an ejection fraction on a dedicated work [employer], [employer]. The patient received 20 cc of Multihance. After 10 minutes inversion recovery sequences were used to assess for infiltration and scar tissue. CONTRAST:  20 cc Multihance FINDINGS: There was mild LAE. The RA and RV were normal. The LV was mildly enlarged. There was mildly asymmetric hypertrophy of the septum Septum measures 15 mm and posterior wall 10 mm. There is no SAM. The patient is s/p mitral repair with an annuloplasty ring. There is no  significant residual MR with mild diastolic bowing. The LAA has been clipped and has no flow. The aortic valve is normal as is the aortic root There is no residual PDA identified. There is a small pericardial effusion and large left pleural effusion. The distal septum and apex appear hypokinetic. The quantitative EF is 45% (EDV 70 cc, ESV 39 cc SV 31 cc) There is a markedly abnormal gadolinium uptake in the LV myocardium on inversion recovery sequences. This uptake is diffuse in the endocardial and mid epicardial areas. Although uptake is diffuse it is especially marked in the inferior wall. IMPRESSION: 1) Mildly  asymmetric septal hypertrophy 15 mm. Distal septal apical hypokinesis EF 45% 2) Diffuse delayed gadolinium uptake in the subendocardial and mid epicardial areas especially marked in the inferior wall 3) S/P mitral valve repair with no residual MR and annuloplasty ring 4) Atri-Clip of the LAA with no flow 5) No residual PDA flow 6) Mild LAE 7) Small pericardial effusion 8) Large left pleural effusion Note delayed gadolinium findings may be consistent with myocarditis vs infiltrative cardiomyopathy Charlton Haws Electronically Signed   By: Charlton Haws M.D.   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  Cardiac arrest    2.  Orthostatic hypotension    3.  MR s/p MVR    4.  Paroxysmal atrial fibrillation  5>  Decreased BS L Lung   6. Melancholia,depression    She is vastly improved Will redose mitodrine and florinef  CXR  Shows large L pleural effusion   Will get pulm to see prior to discharge  Will need cardiology Fu in 2 weeks with BMET  Continue welbutrinin Wound check ok

## 2015-08-28 NOTE — Progress Notes (Signed)
PULMONARY / CRITICAL CARE MEDICINE   Name: Diane [name] MRN: [id number] DOB: [birth year]    ADMISSION DATE:  08/05/2015 CONSULTATION DATE:  08/05/15  REFERRING MD :  [facility] PRIMARY SERVICE: [facility]   BRIEF PATIENT DESCRIPTION: 54-yo F with MVR repair on 07/22/15 found to be in PEA/VT arrest with 3 shocks and ROSC after 7-9 minutes requiring hypothermic protocol. 2/8 PCCM called back for recurrent bilateral effusions.     SIGNIFICANT EVENTS / STUDIES:  1/17 Admitted for PEA/VT arrest, intubated, placed on hypothermic protocol   1/17 CTA chest with no PO, moderate bilateral pleural effusions and consolidative changes of lower lobes 1/17 Echo with normal EF, mild LVH, mild LAW, trace AI, mild MS, no MR, small pericardial effusion, left pleural effusion 1/18 EEG with generalized slowing  1/19 Rewarmed, paralytics, sedation turned off 1/19 CT head normal 1/20 CXR with left sided pleural effusion and b/l pulmonary edema  1/20 INR 9.4 requiring FFP and vitamin K 1/20 IV amio stopped  1/20 EEG with generalized slowing  1/21 Improved mental status, start IV heparin  1/22 Vomited tube feeds, KUB normal  1/23 Thoracentesis of large left effusion with 950 cc semi-hemorrhagic fluid. IV heparin stopped. 1/23 2D echo  2/8 PCCM reconsulted for recurrent pleural effusion    SUBJECTIVE:  CxR shows recurrent effusion  VITAL SIGNS: Temp:  [98.4 F (36.9 C)-98.8 F (37.1 C)] 98.5 F (36.9 C) (02/09 0716) Pulse Rate:  [90-95] 95 (02/09 0555) BP: (124-125)/(54-67) 124/54 mmHg (02/09 0555) SpO2:  [95 %-98 %] 98 % (02/09 0716) Weight:  [115 lb (52.164 kg)] 115 lb (52.164 kg) (02/09 0555) HEMODYNAMICS:   VENTILATOR SETTINGS:   INTAKE / OUTPUT: Intake/Output      02/08 0701 - 02/09 0700 02/09 0701 - 02/10 0700   P.O. 460    I.V. (mL/kg)     Total Intake(mL/kg) 460 (8.8)    Urine (mL/kg/hr) 300 (0.2)    Total Output 300     Net +160            PHYSICAL EXAMINATION: General:  NAD,  sitting in chair, hoarse voice Neuro:intact  HEENT: No jvd/LAN Cardiovascular:  Normal rate and rhythm PPM noted Lungs:  Ronchi/coarse BS in anterior fields  Abdomen: Arctic pads on, non-tender, nondistended, decreased BS,binder in place for hypotension Musculoskeletal:  SCD's on, trace pedal edema Skin:  No rashes  LABS:  CBC  Recent Labs Lab 08/24/15 0753 08/27/15 0602  WBC 5.7 7.2  HGB 10.6* 10.0*  HCT 32.2* 31.3*  PLT 282 PLATELET CLUMPS NOTED ON SMEAR, UNABLE TO ESTIMATE   Coag's  Recent Labs Lab 08/26/15 0827 08/27/15 0602 08/28/15 0708  INR 2.19* 2.08* 2.49*   BMET  Recent Labs Lab 08/23/15 1157 08/24/15 0753  NA 137 139  K 4.0 3.7  CL 101 102  CO2 27 28  BUN 6 <5*  CREATININE 0.71 0.48  GLUCOSE 123* 99   Electrolytes  Recent Labs Lab 08/23/15 1157 08/24/15 0753  CALCIUM 8.9 9.0   Sepsis Markers No results for input(s): LATICACIDVEN, PROCALCITON, O2SATVEN in the last 168 hours. ABG No results for input(s): PHART, PCO2ART, PO2ART in the last 168 hours. Liver Enzymes No results for input(s): AST, ALT, ALKPHOS, BILITOT, ALBUMIN in the last 168 hours. Cardiac Enzymes No results for input(s): TROPONINI, PROBNP in the last 168 hours. Glucose No results for input(s): GLUCAP in the last 168 hours.  Imaging Dg Chest 2 View  08/27/2015  CLINICAL DATA:  Increasing shortness of breath over the last  few days EXAM: CHEST  2 VIEW COMPARISON:  08/16/2015 FINDINGS: Postsurgical changes are noted. A defibrillator is again seen and stable. Small right-sided pleural effusion is noted. A large left-sided pleural effusion is seen occupying approximately 50% of the left hemi thorax. No focal infiltrate is seen although some underlying atelectasis is likely present in the left base. IMPRESSION: Bilateral pleural effusions left considerably greater than right significantly increased from the prior exam. Electronically Signed   By: Alcide Clever M.D.   On: 08/27/2015  12:47     CXR: pending   ASSESSMENT / PLAN:  PULMONARY A: Ventilator dependent respiratory failure in setting of cardiopulmonary arrest Aspiration pneumonia  Bilateral pulmonary edema - resolved  Moderate to large exudative pleural effusions s/p left sided thoracentesis on 1/23   P:  Recurrent left >Right effusion with hypotension and history of thoracentesis 1/23 950 cc of semi hemorraghic fluid  Currently on coumadin with inr 2.38 ? If she should wait and follow up PCCM or CVTS in future with CxR and perform thoracentesis at that time if needed or have her tapped today. She is not SOB therefore would wait for 2 weeks and do follow up. MD to evaluate.   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  Atrial fibrillation s/p LAA clipping (07/22/15) 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% on 08/05/15) Hypotension P:  EP and cardiology following PPM inplace Goal MAP >60 Unable to diuresis secondary to hypotension   RENAL Lab Results  Component Value Date   CREATININE 0.48 08/24/2015   CREATININE 0.71 08/23/2015   CREATININE 0.43* 08/21/2015   CREATININE 0.61 07/03/2015    Recent Labs Lab 08/23/15 1157 08/24/15 0753  K 4.0 3.7     A: No acute issues P:   Monitor BMP and UOP Replete K, aggressive    HEMATOLOGIC A:  Hemorrhagic pleural effusion Chronic AC therapy in setting of MVR and afib  Supratheraputeuic INR with no bleeding - resolved INR 2.38 on coumadin 2/9 Acute normocytic anemia - no active bleeding P:  Will need INR lowered if thoracentesis considered   NEUROLOGIC A:  Anoxic brain injury s/p cardiac arrest and warming with CPR 7-9 min  Pain  P:   Resolved and ready for DC    [facility] ACNP [facility] PCCM Pager [phone] till 3 pm If no answer page [phone] 08/28/2015, 12:35 PM

## 2015-08-28 NOTE — Telephone Encounter (Signed)
[name]/triage I called front desk and took care of fu with me post hospital fro 09/02/15  She Diane [name] is getting discharged today. I walked by her room for drop in . She stil has post intubatin mild dysphagia and signifcant hoarseness. Pls give appt to see DR Lazarus Salines or [facility] Pies or Dr Annalee Genta or Dr Jenne Pane or Dr Jearld Fenton in ENT  THanks  Dr. Kalman Shan, M.D., F.C.C.P Pulmonary and Critical Care Medicine Staff Physician [facility] Pager: [phone], If no answer or between  15:00h - 7:00h: call [phone]  08/28/2015 11:03 AM

## 2015-08-28 NOTE — Plan of Care (Signed)
Problem: Discharge Progression Outcomes Goal: Hemodynamically stable Outcome: Progressing Underwent U/S thoracentesis Left lung due to pleural effusion.

## 2015-08-28 NOTE — Progress Notes (Signed)
Occupational Therapy Treatment Patient Details Name: Diane [name] MRN: [id number] DOB: [birth year] Today's Date: 08/28/2015    History of present illness Underwent MVR in January 2017. 08/05/15 admitted for PEA/VT arrest, intubated, placed on hypothermic protocol. Thoracentesis 1/23. Extubated 1/24. Pacemaker/ICD implantation 1/27.    OT comments  Pt now with plans to go home. Has arranged 24 hour supervision.  Educated pt in safety, energy conservation and to use her shower seat. Pt agrees to wait until her daughter is available before attempting shower.  Pt overall performing mobility and ADL at a supervision level.    Follow Up Recommendations  Home health OT;Supervision/Assistance - 24 hour    Equipment Recommendations  None recommended by OT    Recommendations for Other Services      Precautions / Restrictions Precautions Precautions: Sternal;ICD/Pacemaker;Fall Precaution Comments: watch orthostasis, abdominal binder, TED hose       Mobility Bed Mobility               General bed mobility comments: pt in chair  Transfers   Equipment used: Rolling walker (2 wheeled)   Sit to Stand: Supervision         General transfer comment: no dizziness, cued to stand momentarily before walking    Balance     Sitting balance-Leahy Scale: Good       Standing balance-Leahy Scale: Fair                     ADL Overall ADL's : Needs assistance/impaired     Grooming: Wash/dry Clinical cytogeneticist: Supervision/safety;Ambulation;RW   Toileting- Clothing Manipulation and Hygiene: Supervision/safety;Sit to/from stand       Functional mobility during ADLs: Supervision/safety;Rolling walker General ADL Comments: Pt educated in energy conservation, transporting items safely with walker. Pt has a shower seat at home. Agreeable to awaiting daughter before showering.      Vision                      Perception     Praxis      Cognition   Behavior During Therapy: WFL for tasks assessed/performed Overall Cognitive Status: Within Functional Limits for tasks assessed                       Extremity/Trunk Assessment               Exercises     Shoulder Instructions       General Comments      Pertinent Vitals/ Pain       Pain Assessment: No/denies pain  Home Living                                          Prior Functioning/Environment              Frequency Min 2X/week     Progress Toward Goals  OT Goals(current goals can now be found in the care plan section)  Progress towards OT goals: Progressing toward goals     Plan Discharge plan needs to be updated    Co-evaluation                 End of Session Equipment Utilized During Treatment: Gait belt;Rolling walker   Activity Tolerance Patient  tolerated treatment well   Patient Left in chair;with call bell/phone within reach;with family/visitor present   Nurse Communication          Time: 1610-9604 OT Time Calculation (min): 22 min  Charges: OT General Charges $OT Visit: 1 Procedure OT Treatments $Self Care/Home Management : 8-22 mins  Evern Bio 08/28/2015, 1:55 PM  [phone]

## 2015-08-28 NOTE — Discharge Summary (Addendum)
PATIENT DETAILS Name: Diane [name] Age: 55 y.o. Sex: female Date of Birth: Nov 21, 1960 MRN: [id number]. Admitting Physician: Lupita Leash, MD VQQ:[id number],FIEPPI A, MD  Admit Date: 08/05/2015 Discharge date: 08/29/2015  Recommendations for Outpatient Follow-up:  1. Ensure outpatient follow-up with cardiology and ENT 2. Home health services-PT/OT/RN/Aide/social work/SLP arranged. 3. Home health RN to draw PT/INR on 2/13-fax results to Coumadin clinic.  4. Please repeat CBC/BMET at next visit 5. Consider outpatient psychiatry referral-started on Wellbutrin.  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 Severe mitral regurgitation 07/03/2015  . Chronic combined systolic and diastolic CHF (congestive heart failure) (HCC) 07/03/2015    Grade 3 diastolic dysfunction.  LVEF 40-45% 05/2015.  Marland Kitchen Aortic aneurysm (HCC)   . Patent ductus arteriosus 07/16/2015  . Depression     SOME DEPRESSION (ON MEDS FOR 2 YRS) WHEN HER MOTHER PASSED  . 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 Qty: 60 tablet, Refills: 0    feeding supplement (BOOST / RESOURCE BREEZE) LIQD Take 1 Container by mouth 2 (two) times daily between meals. Qty: 60 Container, Refills: 0    fludrocortisone (FLORINEF) 0.1 MG tablet Take 2 tablets (0.2 mg total) by mouth 2 (two) times daily. Qty: 60 tablet, Refills: 0    midodrine (PROAMATINE) 10 MG tablet Take 1 tablet (10 mg total) by mouth 3 (three) times daily. Qty: 90 tablet, Refills: 0    pantoprazole (PROTONIX) 40 MG tablet Take 1 tablet (40 mg total) by mouth daily at 12 noon. Qty: 30 tablet, Refills: 0    sodium chloride 1 g tablet Take 1 tablet (1 g total) by mouth 2 (two) times daily with a meal. Qty: 60 tablet, Refills: 0      CONTINUE these medications which have CHANGED   Details  traMADol (ULTRAM) 50 MG tablet Take 1 tablet (50 mg total) by mouth every 8 (eight) hours as needed for severe pain. Qty: 30 tablet, Refills: 0    warfarin (COUMADIN) 1 MG tablet Take 2 tablets (2 mg total) by mouth daily at 6 PM. Start 2/11-HOLD dose 2/10 Qty: 30 tablet, Refills: 0      STOP taking these medications     aspirin EC 81 MG EC tablet      furosemide (LASIX) 20 MG tablet      potassium chloride (K-DUR,KLOR-CON) 10 MEQ tablet      metoprolol tartrate (LOPRESSOR) 25 MG tablet         ALLERGIES:   Allergies  Allergen Reactions  . Vicodin [Hydrocodone-Acetaminophen] Other (See Comments)    "she feels out of it", hallucinating  . Adhesive [Tape] Itching and Rash  . Silicone Itching and Rash    TAPE ALLERGY/EKG STICKER ALLERGY, Please use "paper" tape only    BRIEF HPI:  See H&P, Labs, Consult and Test reports for all details in brief,55 year old female with recent MV repair 07/22/15 for severe mitral regurgitation, PDA, and clipping of Lt atrial appendage. She had cardiac cath 07/08/15 with normal coronary arteries.She was admitted on 08/05/15 with a unwitnessed cardiac  arrest  CONSULTATIONS:   cardiology, pulmonary/intensive care and neurology  PERTINENT RADIOLOGIC STUDIES: Dg Chest 1 View  08/28/2015  CLINICAL DATA:  Status post left thoracentesis EXAM: CHEST 1 VIEW COMPARISON:  08/27/2015 FINDINGS: No appreciable pneumothorax. There is only a trace residual left pleural effusion, with some subsegmental atelectasis in the newly re-expanded left lower lobe. Aortic valve prosthesis. AICD noted. Small right pleural effusion. Borderline enlargement of the cardiopericardial silhouette. IMPRESSION: 1. There is only a small residual left pleural effusion, status post thoracentesis. No pneumothorax. Subsegmental atelectasis in the newly re- expanded left lower lobe. 2. Small right pleural effusion with passive atelectasis. 3. Aortic valve prosthesis. Electronically Signed   By: Gaylyn Rong M.D.   On: 08/28/2015 15:59   Dg Chest 2 View  08/27/2015  CLINICAL DATA:  Increasing shortness of breath over the last few days EXAM: CHEST  2 VIEW COMPARISON:  08/16/2015 FINDINGS: Postsurgical changes are noted. A defibrillator is again seen and stable. Small right-sided pleural effusion is noted. A large left-sided pleural effusion is seen occupying approximately 50% of the left hemi thorax. No focal infiltrate is seen although some underlying atelectasis is likely present in the left base. IMPRESSION: Bilateral pleural effusions left considerably greater than right significantly increased from the prior exam. Electronically Signed   By: Alcide Clever M.D.   On: 08/27/2015 12:47   Dg Chest 2 View  08/16/2015  CLINICAL DATA:  ICD placement.  History of mitral valve replacement. EXAM: CHEST  2 VIEW COMPARISON:  08/15/2015 and prior exams FINDINGS: A left-sided dual lead ICD is now noted with leads overlying the right atrium and right ventricle. There is no evidence of pneumothorax. Cardiomegaly,, cardiac valve replacement, left atrial appendage clip again noted. A small left pleural  effusion with left basilar atelectasis and trace right pleural effusion noted. IMPRESSION: Left ICD placement -no evidence of pneumothorax. Small left pleural effusion, left basilar atelectasis and trace right pleural effusion again noted. Electronically Signed   By: Harmon Pier M.D.   On: 08/16/2015 07:18   Dg Chest 2 View  08/15/2015  CLINICAL DATA:  Follow-up pleural effusion EXAM: CHEST  2 VIEW COMPARISON:  08/13/2015 FINDINGS: Right pleural effusion improved. Small left pleural effusion is stable. No pneumothorax. Postoperative changes are stable. Left jugular venous catheter removed. Hyperaeration. Minimal atelectasis at the left base. IMPRESSION: Improved right pleural effusion. Stable left pleural effusion. No pneumothorax. Electronically Signed   By: Jolaine Click M.D.   On: 08/15/2015 10:36   Dg Chest 2 View  07/31/2015  CLINICAL DATA:  Worsening shortness of breath, cough, and peripheral edema. 9 days postop from mitral valve replacement. EXAM: CHEST  2 VIEW COMPARISON:  07/25/2015 FINDINGS: Increased moderate left pleural effusion is seen. Small right pleural effusion shows no significant change. There is increased atelectasis or infiltrate in the left lower lung. No pneumothorax visualized. Previous median sternotomy and mitral valve replacement noted. IMPRESSION: Increased  moderate left pleural effusion and left lower lobe atelectasis versus infiltrate. No significant change in small right pleural effusion. Electronically Signed   By: Myles Rosenthal M.D.   On: 07/31/2015 15:51   Ct Head Wo Contrast  08/07/2015  CLINICAL DATA:  Status post cardiac arrest. History of CHF, aortic aneurysm. EXAM: CT HEAD WITHOUT CONTRAST TECHNIQUE: Contiguous axial images were obtained from the base of the skull through the vertex without intravenous contrast. COMPARISON:  MRI of the brain January 14, 2015 FINDINGS: The ventricles and sulci are normal. No intraparenchymal hemorrhage, mass effect nor midline shift. No  acute large vascular territory infarcts. No abnormal extra-axial fluid collections. Basal cisterns are patent. No skull fracture. Subcentimeter calcification LEFT periorbital soft tissues. The included ocular globes and orbital contents are non-suspicious. The mastoid aircells and included paranasal sinuses are well-aerated. IMPRESSION: Negative CT head. Electronically Signed   By: Awilda Metro M.D.   On: 08/07/2015 23:36   Ct Angio Chest Pe W/cm &/or Wo Cm  08/05/2015  CLINICAL DATA:  55 year old female with shortness of breath. Status post CPR. EXAM: CT ANGIOGRAPHY CHEST WITH CONTRAST TECHNIQUE: Multidetector CT imaging of the chest was performed using the standard protocol during bolus administration of intravenous contrast. Multiplanar CT image reconstructions and MIPs were obtained to evaluate the vascular anatomy. CONTRAST:  80mL OMNIPAQUE IOHEXOL 350 MG/ML SOLN COMPARISON:  Radiograph dated 08/05/2015 FINDINGS: An endotracheal tube is noted with tip at the carina tilting towards the right mainstem bronchus. Recommend retraction and repositioning by approximately 3- 4 cm. Moderate bilateral pleural effusions. There is complete consolidation of the lower lobes bilaterally with air bronchograms. There is partial consolidative changes of the left upper lobe and lingula. There is ground-glass opacity within the lingula. Findings may represent pneumonia, or pulmonary edema or ARDS. Clinical correlation is recommended. There is no pneumothorax. The visualized thoracic aorta appears unremarkable. No CT evidence of pulmonary embolism. Top-normal cardiac size. Mechanical mitral valve. Small pockets of air noted within the right atrium, likely iatrogenic from IV injection. There is retrograde flow of contrast from the right atrium into the IVC compatible with a degree of right cardiac dysfunction. No significant pericardial effusion. Small amount of fluid along the left aortic arch in the upper mediastinum may  represent complex fluid or small amount of blood/serosanguineous fluid. Small pockets of air noted within this fluid in the upper mediastinum compatible with small pneumomediastinum. An enteric tube is partially visualized extending into the upper abdomen with tip not included in the CT. There is no axillary adenopathy. There is diffuse subcutaneous soft tissue edema and anasarca. Mild degenerative changes of the spine. Median sternotomy wires age indeterminate fracture of the left first rib, new from prior study possibly acute or subacute. No other acute fracture identified. Review of the MIP images confirms the above findings. IMPRESSION: No CT evidence of pulmonary embolism. Moderate bilateral pleural effusions with complete consolidative changes of the lower lobes and partial consolidative changes of the left upper lobe. Clinical correlation is recommended. Endotracheal tube the tip at the origin of the right mainstem bronchus. Recommend retraction and repositioning by approximately 3-4 cm. Small high attenuating fluid along the left aspect of the aortic arch may represent complex fluid or small amount of blood. These results were called by telephone at the time of interpretation on 08/05/2015 at 10:59 pm to Dr. Marijean Niemann , who verbally acknowledged these results. Electronically Signed   By: Elgie Collard M.D.   On: 08/05/2015 23:04   Dg  Chest Port 1 View  08/29/2015  CLINICAL DATA:  Cough. EXAM: PORTABLE CHEST 1 VIEW COMPARISON:  08/28/2015. FINDINGS: Cardiac pacer noted lead tips in right atrium right ventricle. Prior cardiac valve replacement. Prior CABG. Left atrial appendage clip. Cardiomegaly. Mild left mid and lower lung field infiltrate and bilateral interstitial prominence. Small pleural effusions are noted increased from prior exam. Findings suggest congestive heart failure. Pneumonia particularly in the left mid and lower lung cannot be excluded. No pneumothorax . IMPRESSION: 1. Cardiac pacer  in stable position. Prior CABG and cardiac valve replacement. Cardiomegaly . 2. Increasing interstitial prominence with infiltrate in the left mid and lower lung. Progressive bilateral pleural effusions. Findings consistent with congestive heart failure. Left mid and lower lung pneumonia cannot be excluded. Electronically Signed   By: Maisie Fus  Register   On: 08/29/2015 07:34   Dg Chest Port 1 View  08/13/2015  CLINICAL DATA:  Shortness of breath.  Pleural effusion. EXAM: PORTABLE CHEST 1 VIEW COMPARISON:  08/12/2015. FINDINGS: Interim extubation removal of NG tube. Left IJ line in stable position. Prior CABG and cardiac valve replacement. Left atrial appendage clip noted. Stable mild cardiomegaly. No pulmonary venous congestion. Low lung volumes with bibasilar atelectasis and/or bilateral infiltrates/edema. Small bilateral pleural effusions. IMPRESSION: 1. Interim extubation removal of NG tube. Left IJ line in stable position. 2. Prior CABG and cardiac valve replacement. Left atrial appendage clip noted. Stable cardiomegaly. No pulmonary venous congestion. 3. Low lung volumes with persistent bibasilar atelectasis and/or pulmonary infiltrates/edema. Persistent bilateral pleural effusions. No interim change. Electronically Signed   By: Maisie Fus  Register   On: 08/13/2015 07:06   Dg Chest Port 1 View  08/12/2015  CLINICAL DATA:  Endotracheal tube.  Shortness of breath. EXAM: PORTABLE CHEST 1 VIEW COMPARISON:  08/11/2015 FINDINGS: Endotracheal tube with tip measuring 4.5 cm above the carina. Enteric tube tip is off the field of view but below the left hemidiaphragm. Left central venous catheter with tip over the low SVC region. Normal heart size and pulmonary vascularity. Shallow inspiration. Small bilateral pleural effusions with basilar atelectasis or infiltration. No definite change since previous study, allowing for differences in technique. Postoperative changes in the mediastinum. IMPRESSION: Appliances appear  to be in satisfactory location. Small bilateral pleural effusions with basilar atelectasis or infiltration. Electronically Signed   By: Burman Nieves M.D.   On: 08/12/2015 03:39   Dg Chest Port 1 View  08/11/2015  CLINICAL DATA:  Status post left-sided thoracentesis EXAM: PORTABLE CHEST 1 VIEW COMPARISON:  Study obtained earlier in the day FINDINGS: Left effusion is smaller following thoracentesis. No pneumothorax. Endotracheal tube tip is 3.6 cm above the carina. Central catheter tip is in the superior vena cava. Nasogastric tube tip and side port are below the diaphragm in the stomach. There remains a fairly small left effusion. There is also a small right effusion. There is moderate atelectatic change in the left base and medial right base regions. Heart is mildly enlarged. Patient has a left atrial appendage clamp. The patient is status post mitral valve replacement. IMPRESSION: Left effusion smaller following thoracentesis. No pneumothorax. There is a small left effusion with small right effusion is well. Bibasilar atelectatic change, more on the left than on the right. Stable cardiac silhouette. Tube and catheter positions as described without pneumothorax. Electronically Signed   By: Bretta Bang III M.D.   On: 08/11/2015 13:02   Dg Chest Port 1 View  08/11/2015  CLINICAL DATA:  Ventilator.  Mitral valve repair. EXAM: PORTABLE CHEST  1 VIEW COMPARISON:  08/10/2015 FINDINGS: Changes of median sternotomy and valve replacement. Bilateral pleural effusions, left greater than right, likely slightly improved since prior study. Bilateral airspace opacities, most pronounced in the the lower lobes, also greater on the left with slight improvement in aeration since prior study. No pneumothorax. No acute bony abnormality. IMPRESSION: Moderate to large bilateral pleural effusions and bilateral airspace disease, both greater on the left. This is improved slightly since prior study. Support devices stable.  Electronically Signed   By: Charlett Nose M.D.   On: 08/11/2015 07:12   Dg Chest Port 1 View  08/10/2015  CLINICAL DATA:  Endotracheally intubated. Pt currently on lasix and diuresed about 4 liters of fluid from chest. Checking on fluid to be able to extubate tomorrow. EXAM: PORTABLE CHEST 1 VIEW COMPARISON:  08/10/2015 FINDINGS: Lines and tubes unchanged. Large left pleural effusion unchanged. Moderate right pleural effusion unchanged. Vascular congestion stable. Bilateral airspace opacity presumably represent the compressive atelectasis as the result of the pleural fluid stable. IMPRESSION: Radiographically no change from prior study Electronically Signed   By: Esperanza Heir M.D.   On: 08/10/2015 14:12   Dg Chest Port 1 View  08/10/2015  CLINICAL DATA:  Pleural effusion. EXAM: PORTABLE CHEST 1 VIEW COMPARISON:  08/09/2015 FINDINGS: Endotracheal tube, enteric catheter, left IJ approach central venous catheter are stable. Median sternotomy wires also seen. Cardiomediastinal silhouette is partially obscured by dense lower lobe airspace opacities and bilateral pleural effusions. Reticular opacities are seen in the upper lobes. There is no evidence of focal airspace consolidation, pleural effusion or pneumothorax. Osseous structures are without acute abnormality. Soft tissues are grossly normal. IMPRESSION: Pulmonary edema with bilateral pleural effusions. More confluent airspace consolidation in bilateral lung bases may represent areas of subsegmental atelectasis. Electronically Signed   By: Ted Mcalpine M.D.   On: 08/10/2015 08:47   Dg Chest Port 1 View  08/09/2015  CLINICAL DATA:  Check endotracheal tube placement EXAM: PORTABLE CHEST - 1 VIEW COMPARISON:  08/08/2015 FINDINGS: An endotracheal tube is again seen approximately 6 cm above the carina. A left jugular central line is noted at the cavoatrial junction. Nasogastric catheter extends into the stomach. Postsurgical changes are seen. Left-sided  pleural effusion is again noted. Patchy infiltrates are again seen likely related to edema but stable. No new focal abnormality is noted. IMPRESSION: Tubes and lines as described. Left-sided pleural effusion and bilateral pulmonary edema stable in appearance. Electronically Signed   By: Alcide Clever M.D.   On: 08/09/2015 08:05   Dg Chest Port 1 View  08/08/2015  CLINICAL DATA:  Cardiac arrest EXAM: PORTABLE CHEST 1 VIEW COMPARISON:  08/07/2015 FINDINGS: Endotracheal tube in good position. Central venous catheter tip in the SVC unchanged. NG tube in the stomach. Moderately large left effusion with left lower lobe airspace disease unchanged. Smaller right effusion with right lower lobe airspace disease unchanged. Diffuse bilateral airspace disease may represent edema and has progressed. IMPRESSION: Progressive bilateral airspace disease most likely due to edema. No change in bilateral pleural effusions left greater than right with compressive atelectasis in the bases. Endotracheal tube remains in good position. Electronically Signed   By: Marlan Palau M.D.   On: 08/08/2015 08:06   Dg Chest Port 1 View  08/07/2015  CLINICAL DATA:  Heart failure. EXAM: PORTABLE CHEST 1 VIEW COMPARISON:  08/05/2015. FINDINGS: Endotracheal tube, NG tube, left IJ line in stable position. Prior cardiac valve replacement. Left atrial appendage clip noted. Partial clearing of bilateral pulmonary infiltrates/edema.  Persistent but slightly improved bilateral pleural effusions. No pneumothorax . IMPRESSION: 1. Lines and tubes in stable position. 2. Partial clearing of bilateral pulmonary edema and pleural effusions. 3. Prior cardiac valve replacement.  Stable cardiomegaly. Electronically Signed   By: Maisie Fus  Register   On: 08/07/2015 07:32   Dg Chest Port 1 View  08/05/2015  CLINICAL DATA:  Central line placement.  Initial encounter. EXAM: PORTABLE CHEST 1 VIEW COMPARISON:  Chest radiograph performed earlier today at 7:10 p.m., and  CTA of the chest performed earlier today at 10:15 p.m. FINDINGS: The left IJ line is noted ending about the distal SVC. The patient's endotracheal tube is noted ending 2-3 cm above the carina. An enteric tube is noted extending below the diaphragm. Vascular congestion is again noted. Note is made of small right and small to moderate left pleural effusions, and likely underlying pulmonary edema. No pneumothorax is seen. The cardiomediastinal silhouette is mildly enlarged. The patient is status post median sternotomy. A mitral valve replacement is noted. No acute osseous abnormalities are identified. IMPRESSION: 1. Left IJ line noted ending about the distal SVC. 2. Endotracheal tube noted ending 2-3 cm above the carina. 3. Vascular congestion and mild cardiomegaly. Small right and small to moderate left pleural effusions, with likely underlying pulmonary edema. Electronically Signed   By: Roanna Raider M.D.   On: 08/05/2015 23:50   Dg Chest Portable 1 View  08/05/2015  CLINICAL DATA:  Status post CPR. Endotracheal tube placement. Initial encounter. EXAM: PORTABLE CHEST 1 VIEW COMPARISON:  Chest radiograph performed 07/31/2015 FINDINGS: The patient's endotracheal tube is noted ending overlying the right mainstem bronchus, 1 cm below the carina. This should be retracted 4 cm. Diffuse bilateral airspace opacification is noted, with a small to moderate left-sided pleural effusion. This is concerning for pulmonary edema, though pneumonia cannot be excluded. No pneumothorax is seen. The cardiomediastinal silhouette is mildly enlarged. The patient is status post median sternotomy. External pacing pads are noted. No acute osseous abnormalities are seen. IMPRESSION: 1. Endotracheal tube noted ending overlying the right mainstem bronchus, 1 cm below the carina. This should be retracted 4 cm. 2. Diffuse bilateral airspace opacification, with a small to moderate left-sided pleural effusion. This is concerning for pulmonary  edema, though pneumonia cannot be excluded. 3. Mild cardiomegaly. These results were called by telephone at the time of interpretation on 08/05/2015 at 9:20 pm to Nursing at the [facility], who verbally acknowledged these results. The endotracheal tube had already been repositioned. Electronically Signed   By: Roanna Raider M.D.   On: 08/05/2015 21:50   Dg Abd Portable 1v  08/10/2015  CLINICAL DATA:  Vomiting tube feedings, orogastric tube placement EXAM: PORTABLE ABDOMEN - 1 VIEW COMPARISON:  None. FINDINGS: Orogastric tube projects just to the right of midline over the anticipated position of the distal body of the stomach. Nonobstructive gas pattern. Rectal thermometer noted. IMPRESSION: OG tube as described Electronically Signed   By: Esperanza Heir M.D.   On: 08/10/2015 17:43   Mr Card Morphology Wo/w Cm  08/14/2015  CLINICAL DATA:  Cardiac Arrest, Long QT, Cardiomyopathy EXAM: CARDIAC MRI TECHNIQUE: The patient was scanned on a 1.5 Tesla GE magnet. A dedicated cardiac coil was used. Functional imaging was done using Fiesta sequences. 2,3, and 4 chamber views were done to assess for RWMA's. Modified Simpson's rule using a short axis stack was used to calculate an ejection fraction on a dedicated work [employer]. The patient received 20 cc  of Multihance. After 10 minutes inversion recovery sequences were used to assess for infiltration and scar tissue. CONTRAST:  20 cc Multihance FINDINGS: There was mild LAE. The RA and RV were normal. The LV was mildly enlarged. There was mildly asymmetric hypertrophy of the septum Septum measures 15 mm and posterior wall 10 mm. There is no SAM. The patient is s/p mitral repair with an annuloplasty ring. There is no significant residual MR with mild diastolic bowing. The LAA has been clipped and has no flow. The aortic valve is normal as is the aortic root There is no residual PDA identified. There is a small pericardial effusion and large left  pleural effusion. The distal septum and apex appear hypokinetic. The quantitative EF is 45% (EDV 70 cc, ESV 39 cc SV 31 cc) There is a markedly abnormal gadolinium uptake in the LV myocardium on inversion recovery sequences. This uptake is diffuse in the endocardial and mid epicardial areas. Although uptake is diffuse it is especially marked in the inferior wall. IMPRESSION: 1) Mildly asymmetric septal hypertrophy 15 mm. Distal septal apical hypokinesis EF 45% 2) Diffuse delayed gadolinium uptake in the subendocardial and mid epicardial areas especially marked in the inferior wall 3) S/P mitral valve repair with no residual MR and annuloplasty ring 4) Atri-Clip of the LAA with no flow 5) No residual PDA flow 6) Mild LAE 7) Small pericardial effusion 8) Large left pleural effusion Note delayed gadolinium findings may be consistent with myocarditis vs infiltrative cardiomyopathy Charlton Haws Electronically Signed   By: Charlton Haws M.D.   On: 08/14/2015 20:35   US Thoracentesis Asp Pleural Space W/img Guide  08/28/2015  INDICATION: Patient with prior history of mitral valve repair/ ligation of patent ductus arteriosus, recent PA/VT arrest, CHF, dyspnea, bilateral pleural effusions left greater than right. Request is made for diagnostic and therapeutic left thoracentesis up to 1.5 liters. EXAM: ULTRASOUND GUIDED DIAGNOSTIC AND THERAPEUTIC LEFT THORACENTESIS MEDICATIONS: None. COMPLICATIONS: None immediate. PROCEDURE: An ultrasound guided thoracentesis was thoroughly discussed with the patient and questions answered. The benefits, risks, alternatives and complications were also discussed. The patient understands and wishes to proceed with the procedure. Written consent was obtained. Ultrasound was performed to localize and mark an adequate pocket of fluid in the left chest. The area was then prepped and draped in the normal sterile fashion. 1% Lidocaine was used for local anesthesia. Under ultrasound guidance a  Safe-T-Centesis catheter was introduced. Thoracentesis was performed. The catheter was removed and a dressing applied. FINDINGS: A total of approximately 1.3 liters of turbid, amber fluid was removed. Samples were sent to the laboratory as requested by the clinical team. IMPRESSION: Successful ultrasound guided diagnostic and therapeutic left thoracentesis yielding 1.3 liters of pleural fluid. Read by: Jeananne Rama, PA-C Electronically Signed   By: Richarda Overlie M.D.   On: 08/28/2015 15:56     PERTINENT LAB RESULTS: CBC:  Recent Labs  08/27/15 0602  WBC 7.2  HGB 10.0*  HCT 31.3*  PLT PLATELET CLUMPS NOTED ON SMEAR, UNABLE TO ESTIMATE   CMET CMP     Component Value Date/Time   NA 139 08/24/2015 0753   K 3.7 08/24/2015 0753   CL 102 08/24/2015 0753   CO2 28 08/24/2015 0753   GLUCOSE 99 08/24/2015 0753   BUN <5* 08/24/2015 0753   CREATININE 0.48 08/24/2015 0753   CREATININE 0.61 07/03/2015 1128   CALCIUM 9.0 08/24/2015 0753   PROT 5.5* 08/29/2015 0550   ALBUMIN 3.0* 08/20/2015 0540   AST  29 08/20/2015 0540   ALT 34 08/20/2015 0540   ALKPHOS 104 08/20/2015 0540   BILITOT 0.8 08/20/2015 0540   GFRNONAA >60 08/24/2015 0753   GFRAA >60 08/24/2015 0753    GFR Estimated Creatinine Clearance: 60.7 mL/min (by C-G formula based on Cr of 0.48). No results for input(s): LIPASE, AMYLASE in the last 72 hours. No results for input(s): CKTOTAL, CKMB, CKMBINDEX, TROPONINI in the last 72 hours. Invalid input(s): POCBNP No results for input(s): DDIMER in the last 72 hours. No results for input(s): HGBA1C in the last 72 hours. No results for input(s): CHOL, HDL, LDLCALC, TRIG, CHOLHDL, LDLDIRECT in the last 72 hours. No results for input(s): TSH, T4TOTAL, T3FREE, THYROIDAB in the last 72 hours.  Invalid input(s): FREET3 No results for input(s): VITAMINB12, FOLATE, FERRITIN, TIBC, IRON, RETICCTPCT in the last 72 hours. Coags:  Recent Labs  08/28/15 0708 08/29/15 0550  INR 2.49* 3.22*    Microbiology: Recent Results (from the past 240 hour(s))  Culture, body fluid-bottle     Status: None (Preliminary result)   Collection Time: 08/28/15  3:30 PM  Result Value Ref Range Status   Specimen Description FLUID LEFT PLEURAL  Final   Special Requests BOTTLES DRAWN AEROBIC AND ANAEROBIC 10CCS  Final   Culture PENDING  Incomplete   Report Status PENDING  Incomplete  Gram stain     Status: None   Collection Time: 08/28/15  3:30 PM  Result Value Ref Range Status   Specimen Description FLUID LEFT PLEURAL  Final   Special Requests NONE  Final   Gram Stain   Final    ABUNDANT WBC PRESENT, PREDOMINANTLY MONONUCLEAR NO ORGANISMS SEEN    Report Status 08/28/2015 FINAL  Final     BRIEF HOSPITAL COURSE:  Brief narrative: 55 year old female with recent MV repair 07/22/15 for severe mitral regurgitation, PDA, and clipping of Lt atrial appendage. She had cardiac cath 07/08/15 with normal coronary arteries.She was admitted on 08/05/15 with a unwitnessed cardiac arrest-initial records indicate her rhythm was V. tach and PEA. She was successfully resuscitated, admitted to the intensive care unit and started on hypothermia protocol. Upon clinical improvement, she was transferred to the telemetry floor. She has been followed closely by cardiology, has undergone a cardiac MRI and placement of AICD. Hospital course has been complicated by persistent orthostatic hypotension. By day of discharge, much better, initially SNF was contemplated-however since patient has rapidly improved over the past few days-she now wishes to be discharged home with home health services. Please see below for further details.  Hospital course by problem list: Orthostatic hypotension: Improving with initiation of Florinef,Midodrine along with abdominal binder and TED hose. Unfortunately not able to tolerate Mestinon due to GI side effects. ACTH stimulation test, prolactin level, and free T4 are within normal limits. Current  suspicion is for autonomic failure. Fortunately, blood pressure much better today-without any orthostatic signs, Dr. Graciela Husbands has further adjusted dosing of Midodrine and Florinef. Recommendations from cardiology, are for discharge, with close outpatient follow-up with cardiology.  Cardiac arrest: V. tach/PEA-initial rhythm. Successfully resuscitated, since then has undergone AICD placement.  Ventilator dependent respiratory failure: In a setting of cardiac arrest. Resolved.  Acute on chronic diastolic heart failure: Compensated-continue to hold diuretics given orthostatics. Has mild lower extremity edema-suspect from Florinef-dosing being adjusted-by cardiology. Will require close follow-up in the outpatient setting, may need initiation of diuretics in the near future.  Paroxysmal atrial fibrillation: Noted post arrest, continue Coumadin-INR therapeutic.Per EP note-short term anticoagulation, likely d/c  at f/u visit. INR is slightly supratherapeutic on day of discharge, have discussed with the pharmacy team-recommendations are to hold Coumadin today, and restart Coumadin on 2/11. Have asked case management to make sure home health RN draws PT/INR on 2/13 and follows with Coumadin clinic.  Aspiration pneumonia: Treated with 7 days of Unasyn. Afebrile/without leukocytosis. Continue to monitor off antibiotics  Left pleural effusion: Suspect secondary to CHF and recent artery at surgery. Underwent thoracocentesis on 2/11, spoke with Dr. Barnie Alderman with a transudate. Okay to discharge, this will need close outpatient follow-up.  Hoarseness of voice: Outpatient appointment with ENT-Dr [facility] made for Feb 21. Suspicion for laryngeal injury post intubation.   Depression:will empirically start on Wellbutrin-titrate up depending on clinical response. Please ensure follow-up with psychiatry in the outpatient setting.  History of infiltrative cardiomyopathy: SPEP/UPEP negative for M spike. Further  workup/biopsy deferred to the outpatient setting.  History of prior syncope and long QT interval: AICD now in place.  History of mitral regurgitation: Status post mitral valve repair, clipping of left atrial appendage and ligation of patent ductus arteriosus on 07/22/15.  Generalized weakness/deconditioning: Initially SNF was recommended-but since patient has rapidly improved over the past few days and is no longer orthostatic-she wishes to go home and does not want to go to SNF. Maximal home health services arranged.  TODAY-DAY OF DISCHARGE:  Subjective:   Diane [name] today has no headache,no chest abdominal pain,no new weakness tingling or numbness, feels much better wants to go home today.   Objective:   Blood pressure 103/56, pulse 93, temperature 98.4 F (36.9 C), temperature source Oral, resp. rate 18, height 5\' 2"  (1.575 m), weight 48.988 kg (108 lb), SpO2 97 %.  Intake/Output Summary (Last 24 hours) at 08/29/15 1148 Last data filed at 08/29/15 0900  Gross per 24 hour  Intake    520 ml  Output    400 ml  Net    120 ml   Filed Weights   08/27/15 0515 08/28/15 0555 08/29/15 0319  Weight: 53.298 kg (117 lb 8 oz) 52.164 kg (115 lb) 48.988 kg (108 lb)    Exam Awake Alert, Oriented *3, No new F.N deficits, Normal affect [facility].AT,PERRAL Supple Neck,No JVD, No cervical lymphadenopathy appriciated.  Symmetrical Chest wall movement, Good air movement bilaterally, CTAB RRR,No Gallops,Rubs or new Murmurs, No Parasternal Heave +ve B.Sounds, Abd Soft, Non tender, No organomegaly appriciated, No rebound -guarding or rigidity. No Cyanosis, Clubbing or edema, No new Rash or bruise  DISCHARGE CONDITION: Stable  DISPOSITION: Home with home health services  DISCHARGE INSTRUCTIONS:    Activity:  As tolerated with Full fall precautions use walker/cane & assistance as needed  Get Medicines reviewed and adjusted: Please take all your medications with you for your next visit with your  Primary MD  Please request your Primary MD to go over all hospital tests and procedure/radiological results at the follow up, please ask your Primary MD to get all Hospital records sent to his/her office.  If you experience worsening of your admission symptoms, develop shortness of breath, life threatening emergency, suicidal or homicidal thoughts you must seek medical attention immediately by calling 911 or calling your MD immediately  if symptoms less severe.  You must read complete instructions/literature along with all the possible adverse reactions/side effects for all the Medicines you take and that have been prescribed to you. Take any new Medicines after you have completely understood and accpet all the possible adverse reactions/side effects.   Do not drive when  taking Pain medications.   Do not take more than prescribed Pain, Sleep and Anxiety Medications  Special Instructions: If you have smoked or chewed Tobacco  in the last 2 yrs please stop smoking, stop any regular Alcohol  and or any Recreational drug use.  Wear Seat belts while driving.  Please note  You were cared for by a hospitalist during your hospital stay. Once you are discharged, your primary care physician will handle any further medical issues. Please note that NO REFILLS for any discharge medications will be authorized once you are discharged, as it is imperative that you return to your primary care physician (or establish a relationship with a primary care physician if you do not have one) for your aftercare needs so that they can reassess your need for medications and monitor your lab values.   Diet recommendation: Heart Healthy diet  Discharge Instructions    Call MD for:  difficulty breathing, headache or visual disturbances    Complete by:  As directed      Call MD for:  persistant nausea and vomiting    Complete by:  As directed      Call MD for:  redness, tenderness, or signs of infection (pain,  swelling, redness, odor or green/yellow discharge around incision site)    Complete by:  As directed      Call MD for:  severe uncontrolled pain    Complete by:  As directed      Diet - low sodium heart healthy    Complete by:  As directed      Increase activity slowly    Complete by:  As directed            Follow-up Information    Follow up with Sheilah Pigeon, PA-C On 09/09/2015.   Specialty:  Cardiology   Why:  at [facility] information:   [address] [place] [address] [phone]       Follow up with Emeterio Reeve, MD On 09/04/2015.   Specialty:  Family Medicine   Why:  Hospital follow up @ 11am   Contact information:   [address] [place] [address] [phone]       Follow up with [facility].   Why:  Registered Nurse, Physical Therapy   Contact information:   [address] [place] [address] [phone]       Follow up with [facility], MD On 09/02/2015.   Specialty:  Pulmonary Disease   Why:  appt 3:45 pm   Contact information:   [address] [place] [address] [phone]      Total Time spent on discharge equals 45 minutes.  SignedJeoffrey Massed 08/29/2015 11:48 AM

## 2015-08-28 NOTE — Telephone Encounter (Signed)
I have placed referral to [facility] for referral to ENT.

## 2015-08-28 NOTE — Procedures (Signed)
Ultrasound-guided diagnostic and therapeutic left thoracentesis performed yielding 1.3 liters of turbid, amber colored fluid. No immediate complications. Follow-up chest x-ray pending. The fluid was sent to the lab for preordered studies.

## 2015-08-29 ENCOUNTER — Inpatient Hospital Stay (HOSPITAL_COMMUNITY): Payer: BLUE CROSS/BLUE SHIELD

## 2015-08-29 LAB — LACTATE DEHYDROGENASE, PLEURAL OR PERITONEAL FLUID: LD FL: 176 U/L — AB (ref 3–23)

## 2015-08-29 LAB — PATHOLOGIST SMEAR REVIEW

## 2015-08-29 LAB — PROTIME-INR
INR: 3.22 — AB (ref 0.00–1.49)
PROTHROMBIN TIME: 32.3 s — AB (ref 11.6–15.2)

## 2015-08-29 LAB — LACTATE DEHYDROGENASE: LDH: 339 U/L — AB (ref 98–192)

## 2015-08-29 LAB — ACETYLCHOLINE RECEPTOR AB, ALL
ACETYLCHOL BLOCK AB: 17 % (ref 0–25)
ACETYLCHOLINE BINDING AB: 0.07 nmol/L (ref 0.00–0.24)
Acetylcholine Modulat Ab: <12 % (ref 0–20)

## 2015-08-29 LAB — TRIGLYCERIDES, BODY FLUIDS: TRIGLYCERIDES FL: 25 mg/dL

## 2015-08-29 LAB — AMYLASE, PLEURAL FLUID: Amylase, Pleural Fluid: 14 U/L

## 2015-08-29 LAB — PROTEIN, TOTAL: TOTAL PROTEIN: 5.5 g/dL — AB (ref 6.5–8.1)

## 2015-08-29 MED ORDER — WARFARIN SODIUM 1 MG PO TABS: 0.5000 mg | ORAL_TABLET | Freq: Once | ORAL | Status: DC

## 2015-08-29 MED ORDER — WARFARIN SODIUM 1 MG PO TABS: 2.0000 mg | ORAL_TABLET | Freq: Every day | ORAL | Status: DC

## 2015-08-29 NOTE — Progress Notes (Signed)
Shiela from the Lab told me I had to reenter the [facility] lab for the fluid that needed to be added on to yesterdays sample of pleural fluid. I reordered as instructed and sent lab requisition

## 2015-08-29 NOTE — Progress Notes (Signed)
Requisition sent to lab for [facility] on pleural fluid.

## 2015-08-29 NOTE — Progress Notes (Signed)
MBSS complete. Full report located under chart review in imaging section. Khyla Mccumbers, MA CCC-SLP [phone]  

## 2015-08-29 NOTE — Progress Notes (Signed)
TCTS BRIEF PROGRESS NOTE   Progress noted.   I question whether or not [name]'s hoarseness of voice and dysphagia might represent RLN palsey versus laryngeal trauma from intubation.  She still has a moderate sized right pleural effusion - although she denies SOB at this time.  It would not be unreasonable to drain the right pleural effusion since she probably should not be restarted on diuretics, but I would defer to Pulm/CCM and primary care team.  Will continue to follow.  Purcell Nails, MD 08/29/2015 10:57 AM

## 2015-08-29 NOTE — Progress Notes (Addendum)
ANTICOAGULATION CONSULT NOTE - Follow up Consult  Pharmacy Consult for Warfarin Indication: atrial fibrillation  Allergies  Allergen Reactions  . Vicodin [Hydrocodone-Acetaminophen] Other (See Comments)    "she feels out of it", hallucinating  . Adhesive [Tape] Itching and Rash  . Silicone Itching and Rash    TAPE ALLERGY/EKG STICKER ALLERGY, Please use "paper" tape only    Patient Measurements: Height: 5\' 2"  (157.5 cm) Weight: 108 lb (48.988 kg) IBW/kg (Calculated) : 50.1  Vital Signs: Temp: 98.4 F (36.9 C) (02/10 0319) Temp Source: Oral (02/10 0319)  Labs:  Recent Labs  08/27/15 0602 08/28/15 0708 08/29/15 0550  HGB 10.0*  --   --   HCT 31.3*  --   --   PLT PLATELET CLUMPS NOTED ON SMEAR, UNABLE TO ESTIMATE  --   --   LABPROT 23.2* 26.6* 32.3*  INR 2.08* 2.49* 3.22*    Estimated Creatinine Clearance: 60.7 mL/min (by C-G formula based on Cr of 0.48).   Medical History: Past Medical History  Diagnosis Date  . Severe mitral regurgitation 07/03/2015  . Chronic combined systolic and diastolic CHF (congestive heart failure) (HCC) 07/03/2015    Grade 3 diastolic dysfunction.  LVEF 40-45% 05/2015.  Marland Kitchen Aortic aneurysm (HCC)   . Patent ductus arteriosus 07/16/2015  . Depression     SOME DEPRESSION (ON MEDS FOR 2 YRS) WHEN HER MOTHER PASSED  . 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 Pt was on warfarin PTA for MVR. Was on heparin during this admission initially. Heparin stopped prior to ICD implant 1/27. Per EP note, Resume warfarin for short-term. Per Dr. Gwenlyn Perking, ok to start warfarin with no bridging. MVR, ligation PDA, LAA clipping on 07/22/15  PTA dose: warfarin 2mg  daily Restarted warfarin 1/29  INR elevated at 3.22 today. Likely a  combination of poor oral intake and drug interaction with fludrocortisone.   Goal of Therapy:  INR 2-3 Monitor platelets by anticoagulation protocol: Yes   Plan:  -Decrease Coumadin dose to 0.5 mg tonight. If INR down to therapeutic levels tomorrow, likely can increase to 1 mg daily as long as patient is on fludrocortisone. If planning discharge today, recommend f/u INR check in 2-3 days.  -Daily PT/INR -Monitor for s/sx of bleeding  Vinnie Level, PharmD., BCPS Clinical Pharmacist Phone [phone]

## 2015-08-29 NOTE — Progress Notes (Addendum)
PULMONARY / CRITICAL CARE MEDICINE   Name: Diane [name] MRN: [id number] DOB: [birth year]    ADMISSION DATE:  08/05/2015 CONSULTATION DATE:  08/05/15  REFERRING MD :  [facility] PRIMARY SERVICE: [facility]   BRIEF PATIENT DESCRIPTION: 54-yo F with MVR repair on 07/22/15 found to be in PEA/VT arrest with 3 shocks and ROSC after 7-9 minutes requiring hypothermic protocol. 2/8 PCCM called back for recurrent bilateral effusions.     SIGNIFICANT EVENTS / STUDIES:  1/17 Admitted for PEA/VT arrest, intubated, placed on hypothermic protocol   1/17 CTA chest with no PO, moderate bilateral pleural effusions and consolidative changes of lower lobes 1/17 Echo with normal EF, mild LVH, mild LAW, trace AI, mild MS, no MR, small pericardial effusion, left pleural effusion 1/18 EEG with generalized slowing  1/19 Rewarmed, paralytics, sedation turned off 1/19 CT head normal 1/20 CXR with left sided pleural effusion and b/l pulmonary edema  1/20 INR 9.4 requiring FFP and vitamin K 1/20 IV amio stopped  1/20 EEG with generalized slowing  1/21 Improved mental status, start IV heparin  1/22 Vomited tube feeds, KUB normal  1/23 Thoracentesis of large left effusion with 950 cc semi-hemorrhagic fluid. IV heparin stopped. 1/23 2D echo  2/8 PCCM reconsulted for recurrent pleural effusion - 1L tap - protein levels suggest transudate. Has new weakness of voice, hoarse and c/o dysphagia x days/weeks    SUBJECTIVE/OVERNIGHT/INTERVAL HX 08/29/15 - left effusion has recurred today. Feels ok. No dyspnea.Reports cough post thora but improved. Again admits to dysphagia. Speech eval pending. She has fu with me next week as opd and Dr Lazarus Salines ENT   VITAL SIGNS: Temp:  [98.1 F (36.7 C)-99.1 F (37.3 C)] 98.4 F (36.9 C) (02/10 0319) Pulse Rate:  [90-93] 93 (02/09 2052) Resp:  [18] 18 (02/10 0319) BP: (103-129)/(52-67) 103/56 mmHg (02/09 2052) SpO2:  [90 %-100 %] 97 % (02/10 0319) Weight:  [48.988 kg (108 lb)] 48.988  kg (108 lb) (02/10 0319) HEMODYNAMICS:   VENTILATOR SETTINGS:   INTAKE / OUTPUT: Intake/Output      02/09 0701 - 02/10 0700 02/10 0701 - 02/11 0700   P.O. 640 120   Total Intake(mL/kg) 640 (13.1) 120 (2.4)   Urine (mL/kg/hr) 400 (0.3)    Stool 0 (0)    Total Output 400     Net +240 +120        Stool Occurrence 1 x      PHYSICAL EXAMINATION: General:  NAD, sitting in chair, hoarse voice Neuro:intact  HEENT: No jvd/LAN Cardiovascular:  Normal rate and rhythm PPM noted Lungs:  Ronchi/coarse BS in anterior fields  Abdomen: Arctic pads on, non-tender, nondistended, decreased BS,binder in place for hypotension Musculoskeletal:  SCD's on, trace pedal edema Skin:  No rashes  LABS:  CBC  Recent Labs Lab 08/24/15 0753 08/27/15 0602  WBC 5.7 7.2  HGB 10.6* 10.0*  HCT 32.2* 31.3*  PLT 282 PLATELET CLUMPS NOTED ON SMEAR, UNABLE TO ESTIMATE   Coag's  Recent Labs Lab 08/27/15 0602 08/28/15 0708 08/29/15 0550  INR 2.08* 2.49* 3.22*   BMET  Recent Labs Lab 08/23/15 1157 08/24/15 0753  NA 137 139  K 4.0 3.7  CL 101 102  CO2 27 28  BUN 6 <5*  CREATININE 0.71 0.48  GLUCOSE 123* 99   Electrolytes  Recent Labs Lab 08/23/15 1157 08/24/15 0753  CALCIUM 8.9 9.0   Sepsis Markers No results for input(s): LATICACIDVEN, PROCALCITON, O2SATVEN in the last 168 hours. ABG No results for input(s): PHART, PCO2ART,  PO2ART in the last 168 hours. Liver Enzymes No results for input(s): AST, ALT, ALKPHOS, BILITOT, ALBUMIN in the last 168 hours. Cardiac Enzymes No results for input(s): TROPONINI, PROBNP in the last 168 hours. Glucose No results for input(s): GLUCAP in the last 168 hours.  Imaging - highlighted personally visualized Dg Chest 1 View  08/28/2015  CLINICAL DATA:  Status post left thoracentesis EXAM: CHEST 1 VIEW COMPARISON:  08/27/2015 FINDINGS: No appreciable pneumothorax. There is only a trace residual left pleural effusion, with some subsegmental atelectasis  in the newly re-expanded left lower lobe. Aortic valve prosthesis. AICD noted. Small right pleural effusion. Borderline enlargement of the cardiopericardial silhouette. IMPRESSION: 1. There is only a small residual left pleural effusion, status post thoracentesis. No pneumothorax. Subsegmental atelectasis in the newly re- expanded left lower lobe. 2. Small right pleural effusion with passive atelectasis. 3. Aortic valve prosthesis. Electronically Signed   By: Gaylyn Rong M.D.   On: 08/28/2015 15:59   Dg Chest 2 View  08/27/2015  CLINICAL DATA:  Increasing shortness of breath over the last few days EXAM: CHEST  2 VIEW COMPARISON:  08/16/2015 FINDINGS: Postsurgical changes are noted. A defibrillator is again seen and stable. Small right-sided pleural effusion is noted. A large left-sided pleural effusion is seen occupying approximately 50% of the left hemi thorax. No focal infiltrate is seen although some underlying atelectasis is likely present in the left base. IMPRESSION: Bilateral pleural effusions left considerably greater than right significantly increased from the prior exam. Electronically Signed   By: Alcide Clever M.D.   On: 08/27/2015 12:47   Dg Chest Port 1 View  08/29/2015  CLINICAL DATA:  Cough. EXAM: PORTABLE CHEST 1 VIEW COMPARISON:  08/28/2015. FINDINGS: Cardiac pacer noted lead tips in right atrium right ventricle. Prior cardiac valve replacement. Prior CABG. Left atrial appendage clip. Cardiomegaly. Mild left mid and lower lung field infiltrate and bilateral interstitial prominence. Small pleural effusions are noted increased from prior exam. Findings suggest congestive heart failure. Pneumonia particularly in the left mid and lower lung cannot be excluded. No pneumothorax . IMPRESSION: 1. Cardiac pacer in stable position. Prior CABG and cardiac valve replacement. Cardiomegaly . 2. Increasing interstitial prominence with infiltrate in the left mid and lower lung. Progressive bilateral  pleural effusions. Findings consistent with congestive heart failure. Left mid and lower lung pneumonia cannot be excluded. Electronically Signed   By: Maisie Fus  Register   On: 08/29/2015 07:34   US Thoracentesis Asp Pleural Space W/img Guide  08/28/2015  INDICATION: Patient with prior history of mitral valve repair/ ligation of patent ductus arteriosus, recent PA/VT arrest, CHF, dyspnea, bilateral pleural effusions left greater than right. Request is made for diagnostic and therapeutic left thoracentesis up to 1.5 liters. EXAM: ULTRASOUND GUIDED DIAGNOSTIC AND THERAPEUTIC LEFT THORACENTESIS MEDICATIONS: None. COMPLICATIONS: None immediate. PROCEDURE: An ultrasound guided thoracentesis was thoroughly discussed with the patient and questions answered. The benefits, risks, alternatives and complications were also discussed. The patient understands and wishes to proceed with the procedure. Written consent was obtained. Ultrasound was performed to localize and mark an adequate pocket of fluid in the left chest. The area was then prepped and draped in the normal sterile fashion. 1% Lidocaine was used for local anesthesia. Under ultrasound guidance a Safe-T-Centesis catheter was introduced. Thoracentesis was performed. The catheter was removed and a dressing applied. FINDINGS: A total of approximately 1.3 liters of turbid, amber fluid was removed. Samples were sent to the laboratory as requested by the clinical team. IMPRESSION: Successful  ultrasound guided diagnostic and therapeutic left thoracentesis yielding 1.3 liters of pleural fluid. Read by: Jeananne Rama, PA-C Electronically Signed   By: Richarda Overlie M.D.   On: 08/28/2015 15:56     CXR: pending   ASSESSMENT / PLAN:  PULMONARY A:  #Ventilator dependent respiratory failure in setting of cardiopulmonary arrest and Aspiration pneumonia and Bilateral pulmonary edema   - resolved  #Moderate to large exudative pleural effusions s/p left sided thoracentesis on  1/23  . Repeat thora 08/28/15- likely transudate - has recurred again 08/29/15   - suspect recurrent left effusion related to cardiac surgery.  Doubt immediate value in doing a 3rd thora unless symptomatic . Doubt immediate value in doing VATS  #Hoarseness of voice with  #dysphagia    > ? Post intubatin x 2  - laryngeal stenosis  P:  - best follow along effusion - if not resolving over wweeks/months then intervene - thora v vats - await speech eval before dc home - ENT eval Dr Lazarus Salines as opd - set up  D./w Dr Graciela Husbands of EP/cards   Future Appointments Date Time Provider Department Center  09/02/2015 3:45 PM Kalman Shan, MD [facility] None  09/05/2015 8:30 AM Van Clines, MD [facility] None  09/09/2015 3:00 PM Renee Norberto Sorenson, PA-C [facility]     PCCM will sign off   Sister Cardell Peach will be updated   Dr. Kalman Shan, M.D., [facility].C.P Pulmonary and Critical Care Medicine Staff Physician [facility] Pager: [phone], If no answer or between  15:00h - 7:00h: call [phone]  08/29/2015 10:10 AM

## 2015-08-29 NOTE — Progress Notes (Signed)
I reviewed d/c instructions with the patient and family at bedside. Pt did not want to see her primary MD that was scheduled on 09/02/2015 because she felt like that MD did not give her the care she needed and she felt it was hard to talk to her. She asked I make another appt with a different MD. I called the office and made an appt for 09/01/2015 at 1145 with Dr. Braulio Bosch within that same group. Pt appreciative and d/cd via wheelchair to private vehicle in stable condition

## 2015-08-29 NOTE — Evaluation (Addendum)
Clinical/Bedside Swallow Evaluation Patient Details  Name: Diane [name] MRN: [id number] Date of Birth: Nov 27, 1960  Today's Date: 08/29/2015 Time: SLP Start Time (ACUTE ONLY): 1015 SLP Stop Time (ACUTE ONLY): 1040 SLP Time Calculation (min) (ACUTE ONLY): 25 min  Past Medical History:  Past Medical History  Diagnosis Date  . Severe mitral regurgitation 07/03/2015  . Chronic combined systolic and diastolic CHF (congestive heart failure) (HCC) 07/03/2015    Grade 3 diastolic dysfunction.  LVEF 40-45% 05/2015.  Marland Kitchen Aortic aneurysm (HCC)   . Patent ductus arteriosus 07/16/2015  . Depression     SOME DEPRESSION (ON MEDS FOR 2 YRS) WHEN HER MOTHER PASSED  . 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 Ovarian cyst removal    . Tee without cardioversion N/A 07/07/2015    Procedure: TRANSESOPHAGEAL ECHOCARDIOGRAM (TEE);  Surgeon: Chilton Si, MD;  Location: [facility] ENDOSCOPY;  Service: Cardiovascular;  Laterality: N/A;  . Cardiac catheterization N/A 07/08/2015    Procedure: Right/Left Heart Cath and Coronary Angiography;  Surgeon: Marykay Lex, MD;  Location: [facility] INVASIVE CV LAB;  Service: Cardiovascular;  Laterality: N/A;  . Mitral valve repair N/A 07/22/2015    Procedure: MITRAL VALVE REPAIR;  Surgeon: Purcell Nails, MD;  Location: [facility] OR;  Service: Open Heart Surgery;  Laterality: N/A;  26 Sorin Memo 3D  . Tee without cardioversion N/A 07/22/2015    Procedure: TRANSESOPHAGEAL ECHOCARDIOGRAM (TEE);  Surgeon: Purcell Nails, MD;  Location: [facility] OR;  Service: Open Heart Surgery;  Laterality: N/A;  . Patent ductus arterious repair N/A 07/22/2015    Procedure: PATENT DUCTUS ARTERIOSUS (PDA) ligation;  Surgeon: Purcell Nails, MD;  Location: [facility] OR;  Service: Open Heart Surgery;   Laterality: N/A;  . Clipping of atrial appendage  07/22/2015    Procedure: CLIPPING OF LEFT ATRIAL APPENDAGE;  Surgeon: Purcell Nails, MD;  Location: [facility] OR;  Service: Open Heart Surgery;;  35 Atriclip Pro  . Ep implantable device N/A 08/15/2015    Procedure: ICD Implant;  Surgeon: Will Jorja Loa, MD;  Location: [facility] INVASIVE CV LAB;  Service: Cardiovascular;  Laterality: N/A;   HPI:  17-yo F with Mitral Valve Repair/annuloplasty on 07/22/15 (median sternotomy, pulmonary artery dissected away from proximal ascending aorta, distal portion of ther transverse aortic arch,bifurcation of the pulmonary artery and proximal left main pumonary artery exposed. No intraoperative complications; pt extubated same day.  Discharged home 07/27/15, no mention of change in voice or dysphagia. During f/u visit to CTS on 1/12 CXR showed Increased moderate left pleural effusion is seen. Small right pleural effusion shows no significant change. There is increased atelectasis or infiltrate in the left lower lung.Readmitted on 08/05/15. Approx one hour after eating supper pt suddenly collapsed at home. Found to be in PEA/VT arrest with 3 shocks and ROSC after 7-9 minutes requiring hypothermic protocol. CT showed no pulmonary embolism, bilateral airspace disease and pleural effusions, small amount of fluid around aorta. CTS note says "Laboratory data from last night and today are notable for leukocytosis which may be reactive and secondary to the patient's code event. However, some type of underlying infectious process cannot be ruled out." PCCM suspected aspriation from vomiting during intubation. Intubated from 1/17 to 1/24. Seen by SLP on 1/25 - started on regular diet, thin  liquids no straws, documented to be dysphonic.   2/8 PCCM consulted due to recurrent bilateral pleural effusions who reports 1L tap - protein levels suggest transudate. Has weakness of voice, hoarse and c/o dysphagia x days/weeks. MD has already made an  appointment with ENT as an outpatient. Pt indicates to SLPshe cannot recall if hoarse voice has been ongoing since initial surgery.    Assessment / Plan / Recommendation Clinical Impression  Pt reports history and symptoms clinically significant for vocal fold paresis/paralysis, possibly related to MVR procedure on 07/22/15. Pt reports she  noticed voice change since that time with progressive worsening after her d/c home. She also reports a feeling that "things dont close when I swallow" and "when I talk, too much air escapes." When observed with thin liquids the pt swallow three times consecutively and then after 20 seconds demosntrates delayed, soft coughing, highly suggestive of silent aspiration with sensation of aspirate only as it descends the trachea.   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 Pt already has an OP appt with Dr. Lazarus Salines. Will f/u with objective test to determine safest diet and strategies. Though FEES would provide diagnostic information regarding VF function, will proceed with MBS for more comprehensive assessment of quantitity of tracheal aspirate. Pt will certainly undergo endoscopy with ENT next week.     Aspiration Risk  Severe aspiration risk    Diet Recommendation Other (Comment) (asked pt to take extra precautions with small sips until MBS)        Other  Recommendations     Follow up Recommendations  Outpatient SLP    Frequency and Duration            Prognosis Prognosis for Safe Diet Advancement: Fair      Swallow Study   General HPI: 74-yo F with Mitral Valve Repair/annuloplasty on 07/22/15 (median sternotomy, pulmonary artery dissected away from proximal ascending aorta, distal portion of ther transverse aortic arch,bifurcation of the pulmonary artery and proximal left main pumonary artery exposed. No intraoperative complications; pt extubated same day.  Discharged home 07/27/15, no mention of change in voice  or dysphagia. During f/u visit to CTS on 1/12 CXR showed Increased moderate left pleural effusion is seen. Small right pleural effusion shows no significant change. There is increased atelectasis or infiltrate in the left lower lung.Readmitted on 08/05/15. Approx one hour after eating supper pt suddenly collapsed at home. Found to be in PEA/VT arrest with 3 shocks and ROSC after 7-9 minutes requiring hypothermic protocol. CT showed no pulmonary embolism, bilateral airspace disease and pleural effusions, small amount of fluid around aorta. CTS note says "Laboratory data from last night and today are notable for leukocytosis which may be reactive and secondary to the patient's code event. However, some type of underlying infectious process cannot be ruled out." PCCM suspected aspriation from vomiting during intubation. Intubated from 1/17 to 1/24. Seen by SLP on 1/25 - started on regular diet, thin liquids no straws, documented to be dysphonic.   2/8 PCCM consulted due to recurrent bilateral pleural effusions who reports 1L tap - protein levels suggest transudate. Has weakness of voice, hoarse and c/o dysphagia x days/weeks. MD has already made an appointment with ENT as an outpatient. Pt indicates to SLP hoarse voice been ongoing since initial surgery.  Type of Study: MBS-Modified Barium Swallow Study Previous Swallow Assessment: see HPI Diet Prior to this Study: Regular;Thin liquids Temperature Spikes Noted: No Respiratory Status: Nasal cannula History of Recent  Intubation: Yes Length of Intubations (days): 7 days Date extubated: 08/12/15 Behavior/Cognition: Alert;Cooperative;Pleasant mood Oral Care Completed by SLP: No Oral Cavity - Dentition: Adequate natural dentition Vision: Functional for self-feeding Self-Feeding Abilities: Able to feed self Patient Positioning: Upright in chair Baseline Vocal Quality: Aphonic Volitional Cough: Weak Volitional Swallow: Able to elicit    Oral/Motor/Sensory  Function Overall Oral Motor/Sensory Function: Within functional limits   Ice Chips     Thin Liquid Thin Liquid: Impaired Presentation: Cup;Self Fed Pharyngeal  Phase Impairments: Cough - Delayed    Nectar Thick Nectar Thick Liquid: Not tested   Honey Thick Honey Thick Liquid: Not tested   Puree Puree: Not tested   Solid   GO   Solid: Not tested       Harlon Ditty, MA CCC-SLP [phone]  Dewel Lotter, Riley Nearing 08/29/2015,11:25 AM

## 2015-08-30 LAB — RHEUMATOID FACTOR: Rhuematoid fact SerPl-aCnc: <10 IU/mL (ref 0.0–13.9)

## 2015-08-31 LAB — ANTI-DNA ANTIBODY, DOUBLE-STRANDED: ds DNA Ab: <1 IU/mL (ref 0–9)

## 2015-08-31 LAB — CHOLESTEROL, BODY FLUID: CHOL FL: 50 mg/dL

## 2015-08-31 LAB — ANTI-SCLERODERMA ANTIBODY: Scleroderma (Scl-70) (ENA) Antibody, IgG: <0.2 AI (ref 0.0–0.9)

## 2015-08-31 LAB — CYCLIC CITRUL PEPTIDE ANTIBODY, IGG/IGA: CCP Antibodies IgG/IgA: 5 units (ref 0–19)

## 2015-09-01 ENCOUNTER — Telehealth: Payer: Self-pay | Admitting: Internal Medicine

## 2015-09-01 LAB — PH, BODY FLUID: PH, BODY FLUID: 8

## 2015-09-01 LAB — ANTINUCLEAR ANTIBODIES, IFA: ANA Ab, IFA: NEGATIVE

## 2015-09-01 NOTE — Telephone Encounter (Signed)
Daughter called to report symptoms. Pt having fluid retention in legs Has coarse cough, soreness in chest w/ coughing. Pt short of breath when lying supine. Notes Nausea, vomiting, generally weak - pt has had very little fluid intake and BPs low.  Recommend flex clinic - pt to call back when up from nap. Would request schedulers to arrange a flex visit for tomorrow if possible.  Instructed if worse symptoms to go to ED.

## 2015-09-01 NOTE — Telephone Encounter (Signed)
Attempted to reach patient to follow up, no answer when dialed.

## 2015-09-01 NOTE — Telephone Encounter (Signed)
New Message:  Pt's daughter called in wanting to get the pt in to be seen sooner than 2/21 because she has been having some complications since being discharged from the hospital. Those complications are:   Rentention of fluid in bilateral extremities  Fatigue  Constant nausea  No appetite      Progressive cough.  Her primary care doctor was going to suggest an anti-nausea medication but needed approval from her Cardiologist. Please f/u with her

## 2015-09-01 NOTE — Telephone Encounter (Signed)
Dr. Duke Salvia is the patient's primary cardiologist. Patient is s/p MV repair on 07/22/15 and had PEA arrest on 1/17- ICD placed by Dr. Elberta Fortis. Will forward to NL triage for review by primary cardiologist.

## 2015-09-02 ENCOUNTER — Telehealth: Payer: Self-pay | Admitting: Cardiovascular Disease

## 2015-09-02 ENCOUNTER — Inpatient Hospital Stay (HOSPITAL_COMMUNITY)
Admission: EM | Admit: 2015-09-02 | Discharge: 2015-09-04 | DRG: 291 | Disposition: A | Discharge status: Home / Self Care | Payer: BLUE CROSS/BLUE SHIELD | Attending: Internal Medicine | Admitting: Internal Medicine

## 2015-09-02 ENCOUNTER — Emergency Department (HOSPITAL_COMMUNITY): Payer: BLUE CROSS/BLUE SHIELD

## 2015-09-02 ENCOUNTER — Encounter (HOSPITAL_COMMUNITY): Payer: Self-pay | Admitting: Emergency Medicine

## 2015-09-02 ENCOUNTER — Inpatient Hospital Stay: Payer: BLUE CROSS/BLUE SHIELD | Admitting: Internal Medicine

## 2015-09-02 DIAGNOSIS — I959 Hypotension, unspecified: Secondary | ICD-10-CM | POA: Diagnosis present

## 2015-09-02 DIAGNOSIS — I5033 Acute on chronic diastolic (congestive) heart failure: Secondary | ICD-10-CM | POA: Diagnosis not present

## 2015-09-02 DIAGNOSIS — I428 Other cardiomyopathies: Secondary | ICD-10-CM | POA: Diagnosis present

## 2015-09-02 DIAGNOSIS — J90 Pleural effusion, not elsewhere classified: Secondary | ICD-10-CM | POA: Diagnosis not present

## 2015-09-02 DIAGNOSIS — R791 Abnormal coagulation profile: Secondary | ICD-10-CM | POA: Diagnosis present

## 2015-09-02 DIAGNOSIS — D649 Anemia, unspecified: Secondary | ICD-10-CM | POA: Diagnosis present

## 2015-09-02 DIAGNOSIS — I509 Heart failure, unspecified: Secondary | ICD-10-CM | POA: Insufficient documentation

## 2015-09-02 DIAGNOSIS — I5043 Acute on chronic combined systolic (congestive) and diastolic (congestive) heart failure: Secondary | ICD-10-CM | POA: Diagnosis not present

## 2015-09-02 DIAGNOSIS — Z7901 Long term (current) use of anticoagulants: Secondary | ICD-10-CM | POA: Diagnosis not present

## 2015-09-02 DIAGNOSIS — I480 Paroxysmal atrial fibrillation: Secondary | ICD-10-CM | POA: Diagnosis present

## 2015-09-02 DIAGNOSIS — I059 Rheumatic mitral valve disease, unspecified: Secondary | ICD-10-CM | POA: Diagnosis not present

## 2015-09-02 DIAGNOSIS — Z888 Allergy status to other drugs, medicaments and biological substances status: Secondary | ICD-10-CM | POA: Diagnosis not present

## 2015-09-02 DIAGNOSIS — Z9889 Other specified postprocedural states: Secondary | ICD-10-CM | POA: Diagnosis not present

## 2015-09-02 DIAGNOSIS — J810 Acute pulmonary edema: Secondary | ICD-10-CM | POA: Diagnosis not present

## 2015-09-02 DIAGNOSIS — E876 Hypokalemia: Secondary | ICD-10-CM | POA: Diagnosis present

## 2015-09-02 DIAGNOSIS — J9811 Atelectasis: Secondary | ICD-10-CM | POA: Diagnosis present

## 2015-09-02 DIAGNOSIS — Z9114 Patient's other noncompliance with medication regimen: Secondary | ICD-10-CM | POA: Diagnosis not present

## 2015-09-02 DIAGNOSIS — L8990 Pressure ulcer of unspecified site, unspecified stage: Secondary | ICD-10-CM | POA: Diagnosis present

## 2015-09-02 DIAGNOSIS — I951 Orthostatic hypotension: Secondary | ICD-10-CM | POA: Diagnosis present

## 2015-09-02 DIAGNOSIS — J9601 Acute respiratory failure with hypoxia: Secondary | ICD-10-CM | POA: Diagnosis present

## 2015-09-02 DIAGNOSIS — Z886 Allergy status to analgesic agent status: Secondary | ICD-10-CM | POA: Diagnosis not present

## 2015-09-02 DIAGNOSIS — Z952 Presence of prosthetic heart valve: Secondary | ICD-10-CM | POA: Diagnosis not present

## 2015-09-02 DIAGNOSIS — Z95810 Presence of automatic (implantable) cardiac defibrillator: Secondary | ICD-10-CM | POA: Diagnosis not present

## 2015-09-02 DIAGNOSIS — I4891 Unspecified atrial fibrillation: Secondary | ICD-10-CM | POA: Diagnosis present

## 2015-09-02 DIAGNOSIS — I9589 Other hypotension: Secondary | ICD-10-CM | POA: Diagnosis not present

## 2015-09-02 DIAGNOSIS — R0902 Hypoxemia: Secondary | ICD-10-CM | POA: Diagnosis present

## 2015-09-02 DIAGNOSIS — Z8674 Personal history of sudden cardiac arrest: Secondary | ICD-10-CM

## 2015-09-02 DIAGNOSIS — I429 Cardiomyopathy, unspecified: Secondary | ICD-10-CM | POA: Diagnosis present

## 2015-09-02 DIAGNOSIS — I482 Chronic atrial fibrillation: Secondary | ICD-10-CM | POA: Diagnosis present

## 2015-09-02 DIAGNOSIS — Z8249 Family history of ischemic heart disease and other diseases of the circulatory system: Secondary | ICD-10-CM | POA: Diagnosis not present

## 2015-09-02 DIAGNOSIS — R0602 Shortness of breath: Secondary | ICD-10-CM | POA: Diagnosis present

## 2015-09-02 DIAGNOSIS — J9600 Acute respiratory failure, unspecified whether with hypoxia or hypercapnia: Secondary | ICD-10-CM | POA: Diagnosis present

## 2015-09-02 LAB — I-STAT TROPONIN, ED: TROPONIN I, POC: 0.06 ng/mL (ref 0.00–0.08)

## 2015-09-02 LAB — CBC
HEMATOCRIT: 33.9 % — AB (ref 36.0–46.0)
Hemoglobin: 10.6 g/dL — ABNORMAL LOW (ref 12.0–15.0)
MCH: 30.4 pg (ref 26.0–34.0)
MCHC: 31.3 g/dL (ref 30.0–36.0)
MCV: 97.1 fL (ref 78.0–100.0)
Platelets: 300 10*3/uL (ref 150–400)
RBC: 3.49 MIL/uL — ABNORMAL LOW (ref 3.87–5.11)
RDW: 15.9 % — ABNORMAL HIGH (ref 11.5–15.5)
WBC: 7.1 10*3/uL (ref 4.0–10.5)

## 2015-09-02 LAB — CULTURE, BODY FLUID-BOTTLE: CULTURE: NO GROWTH

## 2015-09-02 LAB — BASIC METABOLIC PANEL
Anion gap: 13 (ref 5–15)
BUN: 5 mg/dL — AB (ref 6–20)
CHLORIDE: 98 mmol/L — AB (ref 101–111)
CO2: 25 mmol/L (ref 22–32)
Calcium: 9.1 mg/dL (ref 8.9–10.3)
Creatinine, Ser: 0.54 mg/dL (ref 0.44–1.00)
GFR calc Af Amer: >60 mL/min (ref >60)
GFR calc non Af Amer: >60 mL/min (ref >60)
GLUCOSE: 111 mg/dL — AB (ref 65–99)
POTASSIUM: 3.5 mmol/L (ref 3.5–5.1)
SODIUM: 136 mmol/L (ref 135–145)

## 2015-09-02 LAB — PROTIME-INR
INR: 2.84 — AB (ref 0.00–1.49)
PROTHROMBIN TIME: 29.4 s — AB (ref 11.6–15.2)

## 2015-09-02 LAB — CULTURE, BODY FLUID W GRAM STAIN -BOTTLE

## 2015-09-02 LAB — BRAIN NATRIURETIC PEPTIDE: B NATRIURETIC PEPTIDE 5: 1154.6 pg/mL — AB (ref 0.0–100.0)

## 2015-09-02 MED ORDER — VITAMIN K1 10 MG/ML IJ SOLN
2.5000 mg | Freq: Once | INTRAMUSCULAR | Status: AC
  Administered 2015-09-02: 2.5 mg via INTRAVENOUS
  Filled 2015-09-02: qty 0.25

## 2015-09-02 MED ORDER — SODIUM CHLORIDE 0.9% FLUSH
3.0000 mL | Freq: Two times a day (BID) | INTRAVENOUS | Status: DC
  Administered 2015-09-02 – 2015-09-03 (×3): 3 mL via INTRAVENOUS

## 2015-09-02 MED ORDER — FUROSEMIDE 10 MG/ML IJ SOLN
40.0000 mg | Freq: Two times a day (BID) | INTRAMUSCULAR | Status: DC
  Administered 2015-09-02: 40 mg via INTRAVENOUS
  Filled 2015-09-02 (×2): qty 4

## 2015-09-02 MED ORDER — MORPHINE SULFATE (PF) 2 MG/ML IV SOLN
1.0000 mg | INTRAVENOUS | Status: DC | PRN
  Filled 2015-09-02: qty 1

## 2015-09-02 MED ORDER — FLUDROCORTISONE ACETATE 0.1 MG PO TABS
0.2000 mg | ORAL_TABLET | Freq: Two times a day (BID) | ORAL | Status: DC
Start: 2015-09-02 — End: 2015-09-02

## 2015-09-02 MED ORDER — SODIUM CHLORIDE 0.9% FLUSH: 3.0000 mL | INTRAVENOUS | Status: DC | PRN

## 2015-09-02 MED ORDER — ONDANSETRON HCL 4 MG/2ML IJ SOLN: 4.0000 mg | Freq: Four times a day (QID) | INTRAMUSCULAR | Status: DC | PRN

## 2015-09-02 MED ORDER — MIDODRINE HCL 5 MG PO TABS
10.0000 mg | ORAL_TABLET | Freq: Three times a day (TID) | ORAL | Status: DC
  Administered 2015-09-03 – 2015-09-04 (×5): 10 mg via ORAL
  Filled 2015-09-02 (×5): qty 2

## 2015-09-02 MED ORDER — SODIUM CHLORIDE 0.9 % IV SOLN: 250.0000 mL | INTRAVENOUS | Status: DC | PRN

## 2015-09-02 MED ORDER — FUROSEMIDE 10 MG/ML IJ SOLN
40.0000 mg | Freq: Once | INTRAMUSCULAR | Status: AC
  Administered 2015-09-02: 40 mg via INTRAVENOUS
  Filled 2015-09-02: qty 4

## 2015-09-02 MED ORDER — POTASSIUM CHLORIDE CRYS ER 20 MEQ PO TBCR
20.0000 meq | EXTENDED_RELEASE_TABLET | Freq: Once | ORAL | Status: AC
  Administered 2015-09-02: 20 meq via ORAL
  Filled 2015-09-02: qty 1

## 2015-09-02 MED ORDER — ACETAMINOPHEN 325 MG PO TABS: 650.0000 mg | ORAL_TABLET | ORAL | Status: DC | PRN

## 2015-09-02 MED ORDER — PANTOPRAZOLE SODIUM 40 MG PO TBEC
40.0000 mg | DELAYED_RELEASE_TABLET | Freq: Every day | ORAL | Status: DC
  Administered 2015-09-02 – 2015-09-04 (×3): 40 mg via ORAL
  Filled 2015-09-02 (×3): qty 1

## 2015-09-02 MED ORDER — BUPROPION HCL 75 MG PO TABS
37.5000 mg | ORAL_TABLET | Freq: Two times a day (BID) | ORAL | Status: DC
  Administered 2015-09-02 – 2015-09-04 (×4): 37.5 mg via ORAL
  Filled 2015-09-02 (×5): qty 0.5

## 2015-09-02 NOTE — Progress Notes (Signed)
ANTICOAGULATION CONSULT NOTE - Initial Consult  Pharmacy Consult for Heparin when INR < 2 Indication: atrial fibrillation  Allergies  Allergen Reactions  . Vicodin [Hydrocodone-Acetaminophen] Other (See Comments)    "she feels out of it", hallucinating  . Adhesive [Tape] Itching and Rash  . Silicone Itching and Rash    TAPE ALLERGY/EKG STICKER ALLERGY, Please use "paper" tape only    Patient Measurements: Height: 5\' 2"  (157.5 cm) Weight: 122 lb (55.339 kg) IBW/kg (Calculated) : 50.1 Heparin Dosing Weight: 55 kg  Vital Signs: Temp: 97.9 F (36.6 C) (02/14 0612) Temp Source: Oral (02/14 0612) BP: 141/74 mmHg (02/14 1115) Pulse Rate: 93 (02/14 1115)  Labs:  Recent Labs  09/02/15 0415  HGB 10.6*  HCT 33.9*  PLT 300  CREATININE 0.54    Estimated Creatinine Clearance: 62.1 mL/min (by C-G formula based on Cr of 0.54).   Medical History: Past Medical History  Diagnosis Date  . Severe mitral regurgitation 07/03/2015  . Chronic combined systolic and diastolic CHF (congestive heart failure) (HCC) 07/03/2015    Grade 3 diastolic dysfunction.  LVEF 40-45% 05/2015.  Marland Kitchen Aortic aneurysm (HCC)   . Patent ductus arteriosus 07/16/2015  . Depression     SOME DEPRESSION (ON MEDS FOR 2 YRS) WHEN HER MOTHER PASSED  . 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 07/22/2015    26 mm Sorin Memo 3D ring annuloplasty with ligation of patent ductus arteriosus and clipping of LA appendage    Medications:   (Not in a hospital admission) Scheduled:  . furosemide  40 mg Intravenous Once  . potassium chloride  20 mEq Oral Once   Infusions:  . phytonadione (VITAMIN K) IV      Assessment: 55yo female presents with SOB. Pharmacy is consulted to dose heparin when INR is less than 2.0.  Goal of Therapy:  INR 2-3 Heparin level 0.3-0.7 units/ml Monitor platelets by anticoagulation protocol: Yes   Plan:  Hold coumadin and hepairn Daily INR Continue to monitor H&H  and platelets  Arlean Hopping. Newman Pies, PharmD, BCPS Clinical Pharmacist Pager [phone] 09/02/2015,11:32 AM

## 2015-09-02 NOTE — ED Provider Notes (Signed)
Patient presented to the ER with shortness of breath. She has a complex medical history. Patient had a prolonged hospitalization recently including cardiac arrest. During hospitalization, patient had significant left-sided pleural effusion which was drained. She went home 2 days ago. Since then she has had progressive worsening of shortness of breath.  Face to face Exam: HEENT - PERRLA Lungs - absence of breath sounds at left base and left midlung field; tachypnea; respiratory distress Heart - RRR, no M/R/G Abd - S/NT/ND Neuro - alert, oriented x3  Plan: Patient has recurrent left pleural effusion which is likely causing dyspnea, however, patient also likely volume overloaded. Will initiate BiPAP and diuresis, consult radiology for further management.  Gilda Crease, MD 09/02/15 [phone]

## 2015-09-02 NOTE — Telephone Encounter (Signed)
Zofran not a problem with the medication list we show.

## 2015-09-02 NOTE — ED Provider Notes (Signed)
CSN: [id number]     Arrival date & time 09/02/15  0404 History   First MD Initiated Contact with Patient 09/02/15 [phone]     Chief Complaint  Patient presents with  . Shortness of Breath     (Consider location/radiation/quality/duration/timing/severity/associated sxs/prior Treatment) HPI Patient presents to the emergency department with shortness of breath and chest discomfort.  The patient states that shortness of breath got worse over the last 12 hours.  She states that she was recently in the hospital following cardiac arrest.  She also had valve replacement surgery several weeks ago.  Patient states that nothing seems to make her condition better, but exertion makes the shortness of breath, worse.  Patient states that he has not had any nausea, vomiting, weakness, dizziness, headache, blurred vision, back pain, neck pain, fever, cough, dysuria, incontinence, near syncope or lightheadedness. Past Medical History  Diagnosis Date  . Severe mitral regurgitation 07/03/2015  . Chronic combined systolic and diastolic CHF (congestive heart failure) (HCC) 07/03/2015    Grade 3 diastolic dysfunction.  LVEF 40-45% 05/2015.  Marland Kitchen Aortic aneurysm (HCC)   . Patent ductus arteriosus 07/16/2015  . Depression     SOME DEPRESSION (ON MEDS FOR 2 YRS) WHEN HER MOTHER PASSED  . 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 07/22/2015    26 mm Sorin Memo 3D ring annuloplasty with ligation of patent ductus arteriosus and clipping of LA appendage   Past Surgical History  Procedure Laterality Date  . Ovarian cyst removal    . Tee without cardioversion N/A 07/07/2015    Procedure: TRANSESOPHAGEAL ECHOCARDIOGRAM (TEE);  Surgeon: Chilton Si, MD;  Location: [facility] ENDOSCOPY;  Service: Cardiovascular;  Laterality: N/A;  . Cardiac catheterization N/A 07/08/2015    Procedure: Right/Left Heart Cath and Coronary Angiography;  Surgeon: Marykay Lex, MD;  Location: [facility] INVASIVE CV LAB;  Service:  Cardiovascular;  Laterality: N/A;  . Mitral valve repair N/A 07/22/2015    Procedure: MITRAL VALVE REPAIR;  Surgeon: Purcell Nails, MD;  Location: [facility] OR;  Service: Open Heart Surgery;  Laterality: N/A;  26 Sorin Memo 3D  . Tee without cardioversion N/A 07/22/2015    Procedure: TRANSESOPHAGEAL ECHOCARDIOGRAM (TEE);  Surgeon: Purcell Nails, MD;  Location: [facility] OR;  Service: Open Heart Surgery;  Laterality: N/A;  . Patent ductus arterious repair N/A 07/22/2015    Procedure: PATENT DUCTUS ARTERIOSUS (PDA) ligation;  Surgeon: Purcell Nails, MD;  Location: [facility] OR;  Service: Open Heart Surgery;  Laterality: N/A;  . Clipping of atrial appendage  07/22/2015    Procedure: CLIPPING OF LEFT ATRIAL APPENDAGE;  Surgeon: Purcell Nails, MD;  Location: [facility] OR;  Service: Open Heart Surgery;;  35 Atriclip Pro  . Ep implantable device N/A 08/15/2015    Procedure: ICD Implant;  Surgeon: Will Jorja Loa, MD;  Location: [facility] INVASIVE CV LAB;  Service: Cardiovascular;  Laterality: N/A;   Family History  Problem Relation Age of Onset  . Hypertension Mother    Social History  Substance Use Topics  . Smoking status: Never Smoker   . Smokeless tobacco: Never Used  . Alcohol Use: No   OB History    No data available     Review of Systems  All other systems negative except as documented in the HPI. All pertinent positives and negatives as reviewed in the HPI.   Allergies  Vicodin; Adhesive; and Silicone  Home Medications   Prior to Admission medications   Medication Sig Start Date End  Date Taking? Authorizing Provider  buPROPion (WELLBUTRIN) 75 MG tablet Take 0.5 tablets (37.5 mg total) by mouth 2 (two) times daily. 08/28/15  Yes Shanker Levora Dredge, MD  fludrocortisone (FLORINEF) 0.1 MG tablet Take 2 tablets (0.2 mg total) by mouth 2 (two) times daily. 08/28/15  Yes Shanker Levora Dredge, MD  midodrine (PROAMATINE) 10 MG tablet Take 1 tablet (10 mg total) by mouth 3 (three) times daily. 08/28/15  Yes Shanker Levora Dredge,  MD  pantoprazole (PROTONIX) 40 MG tablet Take 1 tablet (40 mg total) by mouth daily at 12 noon. 08/28/15  Yes Shanker Levora Dredge, MD  warfarin (COUMADIN) 1 MG tablet Take 2 tablets (2 mg total) by mouth daily at 6 PM. Start 2/11-HOLD dose 2/10 08/30/15  Yes Shanker Levora Dredge, MD  feeding supplement (BOOST / RESOURCE BREEZE) LIQD Take 1 Container by mouth 2 (two) times daily between meals. Patient not taking: Reported on 09/02/2015 08/28/15   Maretta Bees, MD  sodium chloride 1 g tablet Take 1 tablet (1 g total) by mouth 2 (two) times daily with a meal. Patient not taking: Reported on 09/02/2015 08/28/15   Maretta Bees, MD  traMADol (ULTRAM) 50 MG tablet Take 1 tablet (50 mg total) by mouth every 8 (eight) hours as needed for severe pain. Patient not taking: Reported on 09/02/2015 08/28/15   Maretta Bees, MD   BP 132/80 mmHg  Pulse 96  Temp(Src) 97.9 F (36.6 C) (Oral)  Resp 23  Ht  (1.575 m)  Wt 55.339 kg  BMI 22.31 kg/m2  SpO2 100% Physical Exam  Constitutional: She is oriented to person, place, and time. She appears well-developed and well-nourished. No distress.  HENT:  Head: Normocephalic and atraumatic.  Mouth/Throat: Oropharynx is clear and moist.  Eyes: Pupils are equal, round, and reactive to light.  Neck: Normal range of motion. Neck supple.  Cardiovascular: Normal rate, regular rhythm and normal heart sounds.  Exam reveals no gallop and no friction rub.   No murmur heard. Pulmonary/Chest: Effort normal and breath sounds normal. No respiratory distress. She has no wheezes.  Abdominal: Soft. Bowel sounds are normal. She exhibits no distension. There is no tenderness.  Neurological: She is alert and oriented to person, place, and time. She exhibits normal muscle tone. Coordination normal.  Skin: Skin is warm and dry. No rash noted. No erythema.  Psychiatric: She has a normal mood and affect. Her behavior is normal.  Nursing note and vitals reviewed.   ED Course   Procedures (including critical care time) Labs Review Labs Reviewed  BASIC METABOLIC PANEL - Abnormal; Notable for the following:    Chloride 98 (*)    Glucose, Bld 111 (*)    BUN 5 (*)    All other components within normal limits  CBC - Abnormal; Notable for the following:    RBC 3.49 (*)    Hemoglobin 10.6 (*)    HCT 33.9 (*)    RDW 15.9 (*)    All other components within normal limits  BRAIN NATRIURETIC PEPTIDE - Abnormal; Notable for the following:    B Natriuretic Peptide 1154.6 (*)    All other components within normal limits  PROTIME-INR  Rosezena Sensor, ED    Imaging Review Dg Chest 2 View  09/02/2015  CLINICAL DATA:  Shortness of breath, chest tightness, CHF. Surgery in January. EXAM: CHEST  2 VIEW COMPARISON:  08/29/2015 FINDINGS: Postoperative changes in the mediastinum. Cardiac pacemaker. Bilateral pleural effusions with basilar atelectasis or consolidation,  greater on the left. There is progression since previous study, particularly on the left. No pneumothorax. Probable cardiac enlargement although the heart shadow is obscured by the parenchymal process. IMPRESSION: Bilateral pleural effusions with basilar atelectasis or consolidation demonstrating progression since previous study. Electronically Signed   By: Burman Nieves M.D.   On: 09/02/2015 04:31   I have personally reviewed and evaluated these images and lab results as part of my medical decision-making.   EKG Interpretation   Date/Time:  Tuesday September 02 2015 04:14:06 EST Ventricular Rate:  97 PR Interval:  172 QRS Duration: 112 QT Interval:  406 QTC Calculation: 515 R Axis:   -52 Text Interpretation:  Normal sinus rhythm Low voltage QRS Left anterior  fascicular block Septal infarct , age undetermined Prolonged QT Abnormal  ECG ED PHYSICIAN INTERPRETATION AVAILABLE IN [facility] Confirmed by  TEST, Record ([id number]) on 09/03/2015 8:00:35 AM      MDM   Final diagnoses:  Acute on chronic  diastolic heart failure (HCC)    Patient be admitted to the hospital for further evaluation and care of possible exacerbation of her CHF and with her recent cardiac arrest.  Patient is advised plan and all questions were answered.  She is placed on BiPAP due to her respiratory status.  CRITICAL CARE Performed by: Carlyle Dolly Total critical care time:40 minutes Critical care time was exclusive of separately billable procedures and treating other patients. Critical care was necessary to treat or prevent imminent or life-threatening deterioration. Critical care was time spent personally by me on the following activities: development of treatment plan with patient and/or surrogate as well as nursing, discussions with consultants, evaluation of patient's response to treatment, examination of patient, obtaining history from patient or surrogate, ordering and performing treatments and interventions, ordering and review of laboratory studies, ordering and review of radiographic studies, pulse oximetry and re-evaluation of patient's condition.   Charlestine Night, PA-C 09/05/15 2357  Gilda Crease, MD 09/14/15 [phone]

## 2015-09-02 NOTE — Consult Note (Signed)
ANTICOAGULATION CONSULT NOTE - Follow Up Consult  Pharmacy Consult for heparin/coumadin Indication: atrial fibrillation  Patient Measurements: Height: 5\' 2"  (157.5 cm) Weight: 122 lb (55.339 kg) IBW/kg (Calculated) : 50.1  Labs:  Recent Labs  09/02/15 0415 09/02/15 1300  HGB 10.6*  --   HCT 33.9*  --   PLT 300  --   LABPROT  --  29.4*  INR  --  2.84*  CREATININE 0.54  --     Estimated Creatinine Clearance: 62.1 mL/min (by C-G formula based on Cr of 0.54).   Assessment: 55 yo female with recent MV repair in January. Pt is on Coumadin PTA 2 mg daily with INR goal of 2-3 according to Coumadin Clinic note in January. INR is therapeutic at 2.84. Pt given vit K 2.5 mg today to decrease INR for possible thoracentesis tomorrow  Pt has recent left pleural effusion. Assessment with CXR tomorrow to evaluate need for thoracentesis  Goal of Therapy:  INR 2-3 Monitor platelets by anticoagulation protocol: Yes   Plan:  Hold heparin and coumadin Start heparin if INR < 2 F/u daily INR and plan for thoracentesis/anticoagulation  Greggory Stallion, PharmD Clinical Pharmacy Resident Pager # [phone] 09/02/2015 3:37 PM

## 2015-09-02 NOTE — ED Notes (Signed)
Phlebotomy  notified RN unable to collect INR and advised to please collect.

## 2015-09-02 NOTE — ED Notes (Signed)
Dr. Pollina at bedside   

## 2015-09-02 NOTE — Telephone Encounter (Signed)
Alcario Drought called in stating that Dr. Docia Chuck wanted to start her on Zolfran for nausea. The doctor just wanted clear this with her Cardiologist to prevent and adverse effects with her other medications. Please f/u with her  Thanks

## 2015-09-02 NOTE — ED Notes (Signed)
Pt speaking in very short sentences, 2-3 words at a time.

## 2015-09-02 NOTE — Telephone Encounter (Signed)
Left msg for patient to call. 

## 2015-09-02 NOTE — Consult Note (Signed)
[name] MRN: [id number], DOB/AGE: [birth year]   Admit date: 09/02/2015   Primary Physician: Darrow Bussing, MD Primary Cardiologist: Dr. Hassell Halim  Pt. Profile:  55 y/o female s/p mitral valve repair 07/22/15, followed by prolonged readmission 1/17-2/10 for VT/PEA arrest, s/p AICD 08/15/15. Recent hospital course also complicated by left pleural effusion s/p thoracentesis. Also with chronic combined systolic + diastolic CHF. She now presents back to the ED, 2 days post discharge, with complaint of worsening dyspnea.   Problem List  Past Medical History  Diagnosis Date  . Severe mitral regurgitation 07/03/2015  . Chronic combined systolic and diastolic CHF (congestive heart failure) (HCC) 07/03/2015    Grade 3 diastolic dysfunction.  LVEF 40-45% 05/2015.  Marland Kitchen Aortic aneurysm (HCC)   . Patent ductus arteriosus 07/16/2015  . Depression     SOME DEPRESSION (ON MEDS FOR 2 YRS) WHEN HER MOTHER PASSED  . 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 07/22/2015    26 mm Sorin Memo 3D ring annuloplasty with ligation of patent ductus arteriosus and clipping of LA appendage    Past Surgical History  Procedure Laterality Date  . Ovarian cyst removal    . Tee without cardioversion N/A 07/07/2015    Procedure: TRANSESOPHAGEAL ECHOCARDIOGRAM (TEE);  Surgeon: Chilton Si, MD;  Location: [facility] ENDOSCOPY;  Service: Cardiovascular;  Laterality: N/A;  . Cardiac catheterization N/A 07/08/2015    Procedure: Right/Left Heart Cath and Coronary Angiography;  Surgeon: Marykay Lex, MD;  Location: [facility] INVASIVE CV LAB;  Service: Cardiovascular;  Laterality: N/A;  . Mitral valve repair N/A 07/22/2015    Procedure: MITRAL VALVE REPAIR;  Surgeon: Purcell Nails, MD;  Location: [facility] OR;  Service: Open Heart Surgery;  Laterality: N/A;  26 Sorin Memo 3D  . Tee without cardioversion N/A 07/22/2015    Procedure: TRANSESOPHAGEAL ECHOCARDIOGRAM (TEE);  Surgeon: Purcell Nails, MD;   Location: [facility] OR;  Service: Open Heart Surgery;  Laterality: N/A;  . Patent ductus arterious repair N/A 07/22/2015    Procedure: PATENT DUCTUS ARTERIOSUS (PDA) ligation;  Surgeon: Purcell Nails, MD;  Location: [facility] OR;  Service: Open Heart Surgery;  Laterality: N/A;  . Clipping of atrial appendage  07/22/2015    Procedure: CLIPPING OF LEFT ATRIAL APPENDAGE;  Surgeon: Purcell Nails, MD;  Location: [facility] OR;  Service: Open Heart Surgery;;  35 Atriclip Pro  . Ep implantable device N/A 08/15/2015    Procedure: ICD Implant;  Surgeon: Will Jorja Loa, MD;  Location: [facility] INVASIVE CV LAB;  Service: Cardiovascular;  Laterality: N/A;     Allergies  Allergies  Allergen Reactions  . Vicodin [Hydrocodone-Acetaminophen] Other (See Comments)    "she feels out of it", hallucinating  . Adhesive [Tape] Itching and Rash  . Silicone Itching and Rash    TAPE ALLERGY/EKG STICKER ALLERGY, Please use "paper" tape only    HPI  55 y/o female with recent prolonged hospitalization from 08/05/15 - 08/29/2015 as outlined below, who presents back to the ED, 2 days after discharge, with complaint of worsening dyspnea.   She has a h/o severe mitral regurgitation, chronic combined systolic and diastolic CHF and syncope. Prior to her recent admission 1/17-2/10, she underwent mitral valve repair with ligation of a patent ductus arteriosus and clipping of left atrial appendage by Dr. Cornelius Moras on 07/22/15 . Pre-operative cath 06/2015 showed no significant coronary disease.  EF was 40-45%. Her post surgical course for uncomplicated.   She was readmitted 08/05/15 for an unwitnessed out  of hospital cardiac arrest (VTach and PEA arrest). She was successfully resuscitated, admitted to the ICU and placed on hypothermia protocol. Repeat echo showed improvement in EF to 50-55% with no WMA. Mitral valve was functioning properly with only mild MS. 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 EF was 45% per MRI. She was also noted to  have long QT intervals. She was evaluated by EP and underwent ICD placement on 08/15/15 per Dr. Elberta Fortis ([employer] ICD). Her hospital course was further complicated by persistent orthostatic hypotension, aspiration PNA,  left pleural effusion s/p thoracocentesis on 08/30/15 and PAF (noted post arrest- per EP notes, plan was for short term anticoagulation). After improvement, she was discharge. SNF was initially recommended, however the patient declined and opted to be discharged home.   Since discharge, the patient notes progressive worsening shortness of breath. She also notes abdominal bloating/distention and weight gain. CXR in the ED shows bilateral pleural effusions with basilar atelectasis vs consolidation, L>R, demonstrating progression since previous study. She was initially  tachypneic with RR at 32 rpm and required Bipap.   CBC shows chronic stable anemia with Hgb at 10.6 (10.0 at time of discharge). White count is normal. She is afebrile. Scr is WNL. BNP is abnormal at 1154.6. POC troponin is negative. Her weight is up significantly compared to discharge weight 2 days ago. Discharge weight was 108 lb. Weight today in the ED is 122 lb. It appears her lasix was held on discharge, likely due to issues with hypotension. She was also discharged home on Florinef, 0.2 mg BID + midodrine.    Home Medications  Prior to Admission medications   Medication Sig Start Date End Date Taking? Authorizing Provider  buPROPion (WELLBUTRIN) 75 MG tablet Take 0.5 tablets (37.5 mg total) by mouth 2 (two) times daily. 08/28/15  Yes Shanker Levora Dredge, MD  fludrocortisone (FLORINEF) 0.1 MG tablet Take 2 tablets (0.2 mg total) by mouth 2 (two) times daily. 08/28/15  Yes Shanker Levora Dredge, MD  midodrine (PROAMATINE) 10 MG tablet Take 1 tablet (10 mg total) by mouth 3 (three) times daily. 08/28/15  Yes Shanker Levora Dredge, MD  pantoprazole (PROTONIX) 40 MG tablet Take 1 tablet (40 mg total) by mouth daily at 12 noon. 08/28/15  Yes  Shanker Levora Dredge, MD  warfarin (COUMADIN) 1 MG tablet Take 2 tablets (2 mg total) by mouth daily at 6 PM. Start 2/11-HOLD dose 2/10 08/30/15  Yes Shanker Levora Dredge, MD  feeding supplement (BOOST / RESOURCE BREEZE) LIQD Take 1 Container by mouth 2 (two) times daily between meals. Patient not taking: Reported on 09/02/2015 08/28/15   Maretta Bees, MD  sodium chloride 1 g tablet Take 1 tablet (1 g total) by mouth 2 (two) times daily with a meal. Patient not taking: Reported on 09/02/2015 08/28/15   Maretta Bees, MD  traMADol (ULTRAM) 50 MG tablet Take 1 tablet (50 mg total) by mouth every 8 (eight) hours as needed for severe pain. Patient not taking: Reported on 09/02/2015 08/28/15   Maretta Bees, MD    Family History  Family History  Problem Relation Age of Onset  . Hypertension Mother     Social History  Social History   Social History  . Marital Status: Married    Spouse Name: N/A  . Number of Children: 2  . Years of Education: N/A   Occupational History  . Seamtress    Social History Main Topics  . Smoking status: Never Smoker   .  Smokeless tobacco: Never Used  . Alcohol Use: No  . Drug Use: No  . Sexual Activity: Not on file   Other Topics Concern  . Not on file   Social History Narrative     Review of Systems General:  No chills, fever, night sweats or weight changes.  Cardiovascular:  No chest pain, +dyspnea on exertion, edema, no orthopnea, palpitations, paroxysmal nocturnal dyspnea. Dermatological: No rash, lesions/masses Respiratory: No cough, dyspnea Urologic: No hematuria, dysuria Abdominal:   No nausea, vomiting, diarrhea, bright red blood per rectum, melena, or hematemesis Neurologic:  No visual changes, wkns, changes in mental status. All other systems reviewed and are otherwise negative except as noted above.   Physical Exam  Blood pressure 130/70, pulse 94, temperature 97.9 F (36.6 C), temperature source Oral, resp. rate 20, height 5\' 2"   (1.575 m), weight 122 lb (55.339 kg), SpO2 100 %.  General: Pleasant, NAD,  Psych: Normal affect. Neuro: Alert and oriented X 3. Moves all extremities spontaneously. HEENT: Normal  Neck: Supple without bruits or JVD. Lungs:  Resp regular and unlabored with [place], Decreased BS over mid to lower left lung field. Slight rales in right lower lung base Heart: RRR no s3, s4, or murmurs. Abdomen: Soft, non-tender, non-distended, BS + x 4.  Extremities: No clubbing, cyanosis or edema. DP/PT/Radials 2+ and equal bilaterally.  Labs  Troponin [facility] of Care Test)  Recent Labs  09/02/15 0422  TROPIPOC 0.06   No results for input(s): CKTOTAL, CKMB, TROPONINI in the last 72 hours. Lab Results  Component Value Date   WBC 7.1 09/02/2015   HGB 10.6* 09/02/2015   HCT 33.9* 09/02/2015   MCV 97.1 09/02/2015   PLT 300 09/02/2015     Recent Labs Lab 08/29/15 0550 09/02/15 0415  NA  --  136  K  --  3.5  CL  --  98*  CO2  --  25  BUN  --  5*  CREATININE  --  0.54  CALCIUM  --  9.1  PROT 5.5*  --   GLUCOSE  --  111*   Lab Results  Component Value Date   CHOL 161 08/11/2015   Lab Results  Component Value Date   DDIMER 0.42 11/22/2014     Radiology/Studies  Dg Chest 1 View  08/28/2015  CLINICAL DATA:  Status post left thoracentesis EXAM: CHEST 1 VIEW COMPARISON:  08/27/2015 FINDINGS: No appreciable pneumothorax. There is only a trace residual left pleural effusion, with some subsegmental atelectasis in the newly re-expanded left lower lobe. Aortic valve prosthesis. AICD noted. Small right pleural effusion. Borderline enlargement of the cardiopericardial silhouette. IMPRESSION: 1. There is only a small residual left pleural effusion, status post thoracentesis. No pneumothorax. Subsegmental atelectasis in the newly re- expanded left lower lobe. 2. Small right pleural effusion with passive atelectasis. 3. Aortic valve prosthesis. Electronically Signed   By: Gaylyn Rong M.D.   On:  08/28/2015 15:59   Dg Chest 2 View  09/02/2015  CLINICAL DATA:  Shortness of breath, chest tightness, CHF. Surgery in January. EXAM: CHEST  2 VIEW COMPARISON:  08/29/2015 FINDINGS: Postoperative changes in the mediastinum. Cardiac pacemaker. Bilateral pleural effusions with basilar atelectasis or consolidation, greater on the left. There is progression since previous study, particularly on the left. No pneumothorax. Probable cardiac enlargement although the heart shadow is obscured by the parenchymal process. IMPRESSION: Bilateral pleural effusions with basilar atelectasis or consolidation demonstrating progression since previous study. Electronically Signed   By: Marisa Cyphers.D.  On: 09/02/2015 04:31   Dg Chest 2 View  08/27/2015  CLINICAL DATA:  Increasing shortness of breath over the last few days EXAM: CHEST  2 VIEW COMPARISON:  08/16/2015 FINDINGS: Postsurgical changes are noted. A defibrillator is again seen and stable. Small right-sided pleural effusion is noted. A large left-sided pleural effusion is seen occupying approximately 50% of the left hemi thorax. No focal infiltrate is seen although some underlying atelectasis is likely present in the left base. IMPRESSION: Bilateral pleural effusions left considerably greater than right significantly increased from the prior exam. Electronically Signed   By: Alcide Clever M.D.   On: 08/27/2015 12:47   Dg Chest 2 View  08/16/2015  CLINICAL DATA:  ICD placement.  History of mitral valve replacement. EXAM: CHEST  2 VIEW COMPARISON:  08/15/2015 and prior exams FINDINGS: A left-sided dual lead ICD is now noted with leads overlying the right atrium and right ventricle. There is no evidence of pneumothorax. Cardiomegaly,, cardiac valve replacement, left atrial appendage clip again noted. A small left pleural effusion with left basilar atelectasis and trace right pleural effusion noted. IMPRESSION: Left ICD placement -no evidence of pneumothorax. Small left  pleural effusion, left basilar atelectasis and trace right pleural effusion again noted. Electronically Signed   By: Harmon Pier M.D.   On: 08/16/2015 07:18   Dg Chest 2 View  08/15/2015  CLINICAL DATA:  Follow-up pleural effusion EXAM: CHEST  2 VIEW COMPARISON:  08/13/2015 FINDINGS: Right pleural effusion improved. Small left pleural effusion is stable. No pneumothorax. Postoperative changes are stable. Left jugular venous catheter removed. Hyperaeration. Minimal atelectasis at the left base. IMPRESSION: Improved right pleural effusion. Stable left pleural effusion. No pneumothorax. Electronically Signed   By: Jolaine Click M.D.   On: 08/15/2015 10:36   Ct Head Wo Contrast  08/07/2015  CLINICAL DATA:  Status post cardiac arrest. History of CHF, aortic aneurysm. EXAM: CT HEAD WITHOUT CONTRAST TECHNIQUE: Contiguous axial images were obtained from the base of the skull through the vertex without intravenous contrast. COMPARISON:  MRI of the brain January 14, 2015 FINDINGS: The ventricles and sulci are normal. No intraparenchymal hemorrhage, mass effect nor midline shift. No acute large vascular territory infarcts. No abnormal extra-axial fluid collections. Basal cisterns are patent. No skull fracture. Subcentimeter calcification LEFT periorbital soft tissues. The included ocular globes and orbital contents are non-suspicious. The mastoid aircells and included paranasal sinuses are well-aerated. IMPRESSION: Negative CT head. Electronically Signed   By: Awilda Metro M.D.   On: 08/07/2015 23:36   Ct Angio Chest Pe W/cm &/or Wo Cm  08/05/2015  CLINICAL DATA:  55 year old female with shortness of breath. Status post CPR. EXAM: CT ANGIOGRAPHY CHEST WITH CONTRAST TECHNIQUE: Multidetector CT imaging of the chest was performed using the standard protocol during bolus administration of intravenous contrast. Multiplanar CT image reconstructions and MIPs were obtained to evaluate the vascular anatomy. CONTRAST:  11mL  OMNIPAQUE IOHEXOL 350 MG/ML SOLN COMPARISON:  Radiograph dated 08/05/2015 FINDINGS: An endotracheal tube is noted with tip at the carina tilting towards the right mainstem bronchus. Recommend retraction and repositioning by approximately 3- 4 cm. Moderate bilateral pleural effusions. There is complete consolidation of the lower lobes bilaterally with air bronchograms. There is partial consolidative changes of the left upper lobe and lingula. There is ground-glass opacity within the lingula. Findings may represent pneumonia, or pulmonary edema or ARDS. Clinical correlation is recommended. There is no pneumothorax. The visualized thoracic aorta appears unremarkable. No CT evidence of pulmonary embolism. Top-normal  cardiac size. Mechanical mitral valve. Small pockets of air noted within the right atrium, likely iatrogenic from IV injection. There is retrograde flow of contrast from the right atrium into the IVC compatible with a degree of right cardiac dysfunction. No significant pericardial effusion. Small amount of fluid along the left aortic arch in the upper mediastinum may represent complex fluid or small amount of blood/serosanguineous fluid. Small pockets of air noted within this fluid in the upper mediastinum compatible with small pneumomediastinum. An enteric tube is partially visualized extending into the upper abdomen with tip not included in the CT. There is no axillary adenopathy. There is diffuse subcutaneous soft tissue edema and anasarca. Mild degenerative changes of the spine. Median sternotomy wires age indeterminate fracture of the left first rib, new from prior study possibly acute or subacute. No other acute fracture identified. Review of the MIP images confirms the above findings. IMPRESSION: No CT evidence of pulmonary embolism. Moderate bilateral pleural effusions with complete consolidative changes of the lower lobes and partial consolidative changes of the left upper lobe. Clinical correlation  is recommended. Endotracheal tube the tip at the origin of the right mainstem bronchus. Recommend retraction and repositioning by approximately 3-4 cm. Small high attenuating fluid along the left aspect of the aortic arch may represent complex fluid or small amount of blood. These results were called by telephone at the time of interpretation on 08/05/2015 at 10:59 pm to Dr. Marijean Niemann , who verbally acknowledged these results. Electronically Signed   By: Elgie Collard M.D.   On: 08/05/2015 23:04   Dg Chest Port 1 View  08/29/2015  CLINICAL DATA:  Cough. EXAM: PORTABLE CHEST 1 VIEW COMPARISON:  08/28/2015. FINDINGS: Cardiac pacer noted lead tips in right atrium right ventricle. Prior cardiac valve replacement. Prior CABG. Left atrial appendage clip. Cardiomegaly. Mild left mid and lower lung field infiltrate and bilateral interstitial prominence. Small pleural effusions are noted increased from prior exam. Findings suggest congestive heart failure. Pneumonia particularly in the left mid and lower lung cannot be excluded. No pneumothorax . IMPRESSION: 1. Cardiac pacer in stable position. Prior CABG and cardiac valve replacement. Cardiomegaly . 2. Increasing interstitial prominence with infiltrate in the left mid and lower lung. Progressive bilateral pleural effusions. Findings consistent with congestive heart failure. Left mid and lower lung pneumonia cannot be excluded. Electronically Signed   By: Maisie Fus  Register   On: 08/29/2015 07:34   Dg Chest Port 1 View  08/13/2015  CLINICAL DATA:  Shortness of breath.  Pleural effusion. EXAM: PORTABLE CHEST 1 VIEW COMPARISON:  08/12/2015. FINDINGS: Interim extubation removal of NG tube. Left IJ line in stable position. Prior CABG and cardiac valve replacement. Left atrial appendage clip noted. Stable mild cardiomegaly. No pulmonary venous congestion. Low lung volumes with bibasilar atelectasis and/or bilateral infiltrates/edema. Small bilateral pleural effusions.  IMPRESSION: 1. Interim extubation removal of NG tube. Left IJ line in stable position. 2. Prior CABG and cardiac valve replacement. Left atrial appendage clip noted. Stable cardiomegaly. No pulmonary venous congestion. 3. Low lung volumes with persistent bibasilar atelectasis and/or pulmonary infiltrates/edema. Persistent bilateral pleural effusions. No interim change. Electronically Signed   By: Maisie Fus  Register   On: 08/13/2015 07:06   Dg Chest Port 1 View  08/12/2015  CLINICAL DATA:  Endotracheal tube.  Shortness of breath. EXAM: PORTABLE CHEST 1 VIEW COMPARISON:  08/11/2015 FINDINGS: Endotracheal tube with tip measuring 4.5 cm above the carina. Enteric tube tip is off the field of view but below the left hemidiaphragm.  Left central venous catheter with tip over the low SVC region. Normal heart size and pulmonary vascularity. Shallow inspiration. Small bilateral pleural effusions with basilar atelectasis or infiltration. No definite change since previous study, allowing for differences in technique. Postoperative changes in the mediastinum. IMPRESSION: Appliances appear to be in satisfactory location. Small bilateral pleural effusions with basilar atelectasis or infiltration. Electronically Signed   By: Burman Nieves M.D.   On: 08/12/2015 03:39   Dg Chest Port 1 View  08/11/2015  CLINICAL DATA:  Status post left-sided thoracentesis EXAM: PORTABLE CHEST 1 VIEW COMPARISON:  Study obtained earlier in the day FINDINGS: Left effusion is smaller following thoracentesis. No pneumothorax. Endotracheal tube tip is 3.6 cm above the carina. Central catheter tip is in the superior vena cava. Nasogastric tube tip and side port are below the diaphragm in the stomach. There remains a fairly small left effusion. There is also a small right effusion. There is moderate atelectatic change in the left base and medial right base regions. Heart is mildly enlarged. Patient has a left atrial appendage clamp. The patient is  status post mitral valve replacement. IMPRESSION: Left effusion smaller following thoracentesis. No pneumothorax. There is a small left effusion with small right effusion is well. Bibasilar atelectatic change, more on the left than on the right. Stable cardiac silhouette. Tube and catheter positions as described without pneumothorax. Electronically Signed   By: Bretta Bang III M.D.   On: 08/11/2015 13:02   Dg Chest Port 1 View  08/11/2015  CLINICAL DATA:  Ventilator.  Mitral valve repair. EXAM: PORTABLE CHEST 1 VIEW COMPARISON:  08/10/2015 FINDINGS: Changes of median sternotomy and valve replacement. Bilateral pleural effusions, left greater than right, likely slightly improved since prior study. Bilateral airspace opacities, most pronounced in the the lower lobes, also greater on the left with slight improvement in aeration since prior study. No pneumothorax. No acute bony abnormality. IMPRESSION: Moderate to large bilateral pleural effusions and bilateral airspace disease, both greater on the left. This is improved slightly since prior study. Support devices stable. Electronically Signed   By: Charlett Nose M.D.   On: 08/11/2015 07:12   Dg Chest Port 1 View  08/10/2015  CLINICAL DATA:  Endotracheally intubated. Pt currently on lasix and diuresed about 4 liters of fluid from chest. Checking on fluid to be able to extubate tomorrow. EXAM: PORTABLE CHEST 1 VIEW COMPARISON:  08/10/2015 FINDINGS: Lines and tubes unchanged. Large left pleural effusion unchanged. Moderate right pleural effusion unchanged. Vascular congestion stable. Bilateral airspace opacity presumably represent the compressive atelectasis as the result of the pleural fluid stable. IMPRESSION: Radiographically no change from prior study Electronically Signed   By: Esperanza Heir M.D.   On: 08/10/2015 14:12   Dg Chest Port 1 View  08/10/2015  CLINICAL DATA:  Pleural effusion. EXAM: PORTABLE CHEST 1 VIEW COMPARISON:  08/09/2015 FINDINGS:  Endotracheal tube, enteric catheter, left IJ approach central venous catheter are stable. Median sternotomy wires also seen. Cardiomediastinal silhouette is partially obscured by dense lower lobe airspace opacities and bilateral pleural effusions. Reticular opacities are seen in the upper lobes. There is no evidence of focal airspace consolidation, pleural effusion or pneumothorax. Osseous structures are without acute abnormality. Soft tissues are grossly normal. IMPRESSION: Pulmonary edema with bilateral pleural effusions. More confluent airspace consolidation in bilateral lung bases may represent areas of subsegmental atelectasis. Electronically Signed   By: Ted Mcalpine M.D.   On: 08/10/2015 08:47   Dg Chest Port 1 View  08/09/2015  CLINICAL  DATA:  Check endotracheal tube placement EXAM: PORTABLE CHEST - 1 VIEW COMPARISON:  08/08/2015 FINDINGS: An endotracheal tube is again seen approximately 6 cm above the carina. A left jugular central line is noted at the cavoatrial junction. Nasogastric catheter extends into the stomach. Postsurgical changes are seen. Left-sided pleural effusion is again noted. Patchy infiltrates are again seen likely related to edema but stable. No new focal abnormality is noted. IMPRESSION: Tubes and lines as described. Left-sided pleural effusion and bilateral pulmonary edema stable in appearance. Electronically Signed   By: Alcide Clever M.D.   On: 08/09/2015 08:05   Dg Chest Port 1 View  08/08/2015  CLINICAL DATA:  Cardiac arrest EXAM: PORTABLE CHEST 1 VIEW COMPARISON:  08/07/2015 FINDINGS: Endotracheal tube in good position. Central venous catheter tip in the SVC unchanged. NG tube in the stomach. Moderately large left effusion with left lower lobe airspace disease unchanged. Smaller right effusion with right lower lobe airspace disease unchanged. Diffuse bilateral airspace disease may represent edema and has progressed. IMPRESSION: Progressive bilateral airspace disease  most likely due to edema. No change in bilateral pleural effusions left greater than right with compressive atelectasis in the bases. Endotracheal tube remains in good position. Electronically Signed   By: Marlan Palau M.D.   On: 08/08/2015 08:06   Dg Chest Port 1 View  08/07/2015  CLINICAL DATA:  Heart failure. EXAM: PORTABLE CHEST 1 VIEW COMPARISON:  08/05/2015. FINDINGS: Endotracheal tube, NG tube, left IJ line in stable position. Prior cardiac valve replacement. Left atrial appendage clip noted. Partial clearing of bilateral pulmonary infiltrates/edema. Persistent but slightly improved bilateral pleural effusions. No pneumothorax . IMPRESSION: 1. Lines and tubes in stable position. 2. Partial clearing of bilateral pulmonary edema and pleural effusions. 3. Prior cardiac valve replacement.  Stable cardiomegaly. Electronically Signed   By: Maisie Fus  Register   On: 08/07/2015 07:32   Dg Chest Port 1 View  08/05/2015  CLINICAL DATA:  Central line placement.  Initial encounter. EXAM: PORTABLE CHEST 1 VIEW COMPARISON:  Chest radiograph performed earlier today at 7:10 p.m., and CTA of the chest performed earlier today at 10:15 p.m. FINDINGS: The left IJ line is noted ending about the distal SVC. The patient's endotracheal tube is noted ending 2-3 cm above the carina. An enteric tube is noted extending below the diaphragm. Vascular congestion is again noted. Note is made of small right and small to moderate left pleural effusions, and likely underlying pulmonary edema. No pneumothorax is seen. The cardiomediastinal silhouette is mildly enlarged. The patient is status post median sternotomy. A mitral valve replacement is noted. No acute osseous abnormalities are identified. IMPRESSION: 1. Left IJ line noted ending about the distal SVC. 2. Endotracheal tube noted ending 2-3 cm above the carina. 3. Vascular congestion and mild cardiomegaly. Small right and small to moderate left pleural effusions, with likely  underlying pulmonary edema. Electronically Signed   By: Roanna Raider M.D.   On: 08/05/2015 23:50   Dg Chest Portable 1 View  08/05/2015  CLINICAL DATA:  Status post CPR. Endotracheal tube placement. Initial encounter. EXAM: PORTABLE CHEST 1 VIEW COMPARISON:  Chest radiograph performed 07/31/2015 FINDINGS: The patient's endotracheal tube is noted ending overlying the right mainstem bronchus, 1 cm below the carina. This should be retracted 4 cm. Diffuse bilateral airspace opacification is noted, with a small to moderate left-sided pleural effusion. This is concerning for pulmonary edema, though pneumonia cannot be excluded. No pneumothorax is seen. The cardiomediastinal silhouette is mildly enlarged. The patient is  status post median sternotomy. External pacing pads are noted. No acute osseous abnormalities are seen. IMPRESSION: 1. Endotracheal tube noted ending overlying the right mainstem bronchus, 1 cm below the carina. This should be retracted 4 cm. 2. Diffuse bilateral airspace opacification, with a small to moderate left-sided pleural effusion. This is concerning for pulmonary edema, though pneumonia cannot be excluded. 3. Mild cardiomegaly. These results were called by telephone at the time of interpretation on 08/05/2015 at 9:20 pm to Nursing at the [facility] ER, who verbally acknowledged these results. The endotracheal tube had already been repositioned. Electronically Signed   By: Roanna Raider M.D.   On: 08/05/2015 21:50   Dg Abd Portable 1v  08/10/2015  CLINICAL DATA:  Vomiting tube feedings, orogastric tube placement EXAM: PORTABLE ABDOMEN - 1 VIEW COMPARISON:  None. FINDINGS: Orogastric tube projects just to the right of midline over the anticipated position of the distal body of the stomach. Nonobstructive gas pattern. Rectal thermometer noted. IMPRESSION: OG tube as described Electronically Signed   By: Esperanza Heir M.D.   On: 08/10/2015 17:43   Dg Swallowing Func-speech  Pathology  08/29/2015  Objective Swallowing Evaluation: Type of Study: MBS-Modified Barium Swallow Study Patient Details Name: Diane [name] MRN: [id number] Date of Birth: 1960-10-09 Today's Date: 08/29/2015 Time: SLP Start Time (ACUTE ONLY): 1300-SLP Stop Time (ACUTE ONLY): 1320 SLP Time Calculation (min) (ACUTE ONLY): 20 min Past Medical History: Past Medical History Diagnosis Date . Severe mitral regurgitation 07/03/2015 . Chronic combined systolic and diastolic CHF (congestive heart failure) (HCC) 07/03/2015   Grade 3 diastolic dysfunction.  LVEF 40-45% 05/2015. Marland Kitchen Aortic aneurysm (HCC)  . Patent ductus arteriosus 07/16/2015 . Depression    SOME DEPRESSION (ON MEDS FOR 2 YRS) WHEN HER MOTHER PASSED . 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 Ovarian cyst removal   . Tee without cardioversion N/A 07/07/2015   Procedure: TRANSESOPHAGEAL ECHOCARDIOGRAM (TEE);  Surgeon: Chilton Si, MD;  Location: [facility] ENDOSCOPY;  Service: Cardiovascular;  Laterality: N/A; . Cardiac catheterization N/A 07/08/2015   Procedure: Right/Left Heart Cath and Coronary Angiography;  Surgeon: Marykay Lex, MD;  Location: [facility] INVASIVE CV LAB;  Service: Cardiovascular;  Laterality: N/A; . Mitral valve repair N/A 07/22/2015   Procedure: MITRAL VALVE REPAIR;  Surgeon: Purcell Nails, MD;  Location: [facility] OR;  Service: Open Heart Surgery;  Laterality: N/A;  26 Sorin Memo 3D . Tee without cardioversion N/A 07/22/2015   Procedure: TRANSESOPHAGEAL ECHOCARDIOGRAM (TEE);  Surgeon: Purcell Nails, MD;  Location: [facility] OR;  Service: Open Heart Surgery;  Laterality: N/A; . Patent ductus arterious repair N/A 07/22/2015   Procedure: PATENT DUCTUS ARTERIOSUS (PDA) ligation;  Surgeon: Purcell Nails, MD;  Location: [facility] OR;  Service: Open Heart Surgery;   Laterality: N/A; . Clipping of atrial appendage  07/22/2015   Procedure: CLIPPING OF LEFT ATRIAL APPENDAGE;  Surgeon: Purcell Nails, MD;  Location: [facility] OR;  Service: Open Heart Surgery;;  35 Atriclip Pro . Ep implantable device N/A 08/15/2015   Procedure: ICD Implant;  Surgeon: Will Jorja Loa, MD;  Location: [facility] INVASIVE CV LAB;  Service: Cardiovascular;  Laterality: N/A; HPI: 54-yo F with Mitral Valve Repair/annuloplasty on 07/22/15 (median sternotomy, pulmonary artery dissected away from proximal ascending aorta, distal portion of ther transverse aortic arch,bifurcation of the pulmonary artery and  proximal left main pumonary artery exposed. No intraoperative complications; pt extubated same day.  Discharged home 07/27/15, no mention of change in voice or dysphagia. During f/u visit to CTS on 1/12 CXR showed Increased moderate left pleural effusion is seen. Small right pleural effusion shows no significant change. There is increased atelectasis or infiltrate in the left lower lung.Readmitted on 08/05/15. Approx one hour after eating supper pt suddenly collapsed at home. Found to be in PEA/VT arrest with 3 shocks and ROSC after 7-9 minutes requiring hypothermic protocol. CT showed no pulmonary embolism, bilateral airspace disease and pleural effusions, small amount of fluid around aorta. CTS note says "Laboratory data from last night and today are notable for leukocytosis which may be reactive and secondary to the patient's code event. However, some type of underlying infectious process cannot be ruled out." PCCM suspected aspriation from vomiting during intubation. Intubated from 1/17 to 1/24. Seen by SLP on 1/25 - started on regular diet, thin liquids no straws, documented to be dysphonic.   2/8 PCCM consulted due to recurrent bilateral pleural effusions who reports 1L tap - protein levels suggest transudate. Has weakness of voice, hoarse and c/o dysphagia x days/weeks. MD has already made an appointment with ENT  as an outpatient. Pt indicates to SLP she cannot recall if hoarse voice been ongoing since initial surgery.  No Data Recorded Assessment / Plan / Recommendation CHL IP CLINICAL IMPRESSIONS 08/29/2015 Therapy Diagnosis Mild pharyngeal phase dysphagia Clinical Impression Pt presents with a mild oropharyngeal dysphagia. Pts swallow function under fluoroscopy appears WNL when she is taking small single sips. When taking large consecutive sips, pt does have aspiration before/during the swallow with immediate sensation, possibly due to vocal fold paresis/paralysis or laryngeal damage with decreased airway protection. When instructed to tuck her chin or turn her head left, pt is able to take large consecutive sips without airway compromise. Pt demonstrates weak soft coughing throughout the exam which was unrelated to PO intake. SLP provided verbal and written instruction for pt to take small single sips, or to tuck her chin or turn her head if drinking rapidly or taking pills. Pt to f/u with ENT and Outpatient SLP as she is very concerned about her voice. Pt may need repeat objective testing following treatment if difficulty swallowing persists.  Impact on safety and function Mild aspiration risk   CHL IP TREATMENT RECOMMENDATION 08/29/2015 Treatment Recommendations Defer treatment plan to f/u with SLP   Prognosis 08/29/2015 Prognosis for Safe Diet Advancement Fair Barriers to Reach Goals -- Barriers/Prognosis Comment -- CHL IP DIET RECOMMENDATION 08/29/2015 SLP Diet Recommendations Regular solids;Thin liquid Liquid Administration via Cup Medication Administration Whole meds with liquid Compensations Minimize environmental distractions;Slow rate;Small sips/bites;Chin tuck;Use straw to facilitate chin tuck Postural Changes Seated upright at 90 degrees   No flowsheet data found.  CHL IP FOLLOW UP RECOMMENDATIONS 08/29/2015 Follow up Recommendations Outpatient SLP   No flowsheet data found.     CHL IP ORAL PHASE 08/29/2015 Oral  Phase WFL Oral - Pudding Teaspoon -- Oral - Pudding Cup -- Oral - Honey Teaspoon -- Oral - Honey Cup -- Oral - Nectar Teaspoon -- Oral - Nectar Cup -- Oral - Nectar Straw -- Oral - Thin Teaspoon -- Oral - Thin Cup -- Oral - Thin Straw -- Oral - Puree -- Oral - Mech Soft -- Oral - Regular -- Oral - Multi-Consistency -- Oral - Pill -- Oral Phase - Comment --  CHL IP PHARYNGEAL PHASE 08/29/2015 Pharyngeal Phase Impaired Pharyngeal- Pudding  Teaspoon -- Pharyngeal -- Pharyngeal- Pudding Cup -- Pharyngeal -- Pharyngeal- Honey Teaspoon -- Pharyngeal -- Pharyngeal- Honey Cup -- Pharyngeal -- Pharyngeal- Nectar Teaspoon -- Pharyngeal -- Pharyngeal- Nectar Cup -- Pharyngeal -- Pharyngeal- Nectar Straw -- Pharyngeal -- Pharyngeal- Thin Teaspoon -- Pharyngeal -- Pharyngeal- Thin Cup Penetration/Aspiration before swallow Pharyngeal Material does not enter airway;Material enters airway, passes BELOW cords then ejected out Pharyngeal- Thin Straw WFL;Compensatory strategies attempted (with notebox) Pharyngeal -- Pharyngeal- Puree WFL Pharyngeal -- Pharyngeal- Mechanical Soft -- Pharyngeal -- Pharyngeal- Regular WFL Pharyngeal -- Pharyngeal- Multi-consistency -- Pharyngeal -- Pharyngeal- Pill -- Pharyngeal -- Pharyngeal Comment --  No flowsheet data found. No flowsheet data found. Harlon Ditty, [place] CCC-SLP [phone] [place], Riley Nearing 08/29/2015, 2:28 PM              Mr Card Morphology Wo/w Cm  08/14/2015  CLINICAL DATA:  Cardiac Arrest, Long QT, Cardiomyopathy EXAM: CARDIAC MRI TECHNIQUE: The patient was scanned on a 1.5 Tesla GE magnet. A dedicated cardiac coil was used. Functional imaging was done using Fiesta sequences. 2,3, and 4 chamber views were done to assess for RWMA's. Modified Simpson's rule using a short axis stack was used to calculate an ejection fraction on a dedicated work [employer]. The patient received 20 cc of Multihance. After 10 minutes inversion recovery sequences were used to assess  for infiltration and scar tissue. CONTRAST:  20 cc Multihance FINDINGS: There was mild LAE. The RA and RV were normal. The LV was mildly enlarged. There was mildly asymmetric hypertrophy of the septum Septum measures 15 mm and posterior wall 10 mm. There is no SAM. The patient is s/p mitral repair with an annuloplasty ring. There is no significant residual MR with mild diastolic bowing. The LAA has been clipped and has no flow. The aortic valve is normal as is the aortic root There is no residual PDA identified. There is a small pericardial effusion and large left pleural effusion. The distal septum and apex appear hypokinetic. The quantitative EF is 45% (EDV 70 cc, ESV 39 cc SV 31 cc) There is a markedly abnormal gadolinium uptake in the LV myocardium on inversion recovery sequences. This uptake is diffuse in the endocardial and mid epicardial areas. Although uptake is diffuse it is especially marked in the inferior wall. IMPRESSION: 1) Mildly asymmetric septal hypertrophy 15 mm. Distal septal apical hypokinesis EF 45% 2) Diffuse delayed gadolinium uptake in the subendocardial and mid epicardial areas especially marked in the inferior wall 3) S/P mitral valve repair with no residual MR and annuloplasty ring 4) Atri-Clip of the LAA with no flow 5) No residual PDA flow 6) Mild LAE 7) Small pericardial effusion 8) Large left pleural effusion Note delayed gadolinium findings may be consistent with myocarditis vs infiltrative cardiomyopathy Charlton Haws Electronically Signed   By: Charlton Haws M.D.   On: 08/14/2015 20:35   US Thoracentesis Asp Pleural Space W/img Guide  08/28/2015  INDICATION: Patient with prior history of mitral valve repair/ ligation of patent ductus arteriosus, recent PA/VT arrest, CHF, dyspnea, bilateral pleural effusions left greater than right. Request is made for diagnostic and therapeutic left thoracentesis up to 1.5 liters. EXAM: ULTRASOUND GUIDED DIAGNOSTIC AND THERAPEUTIC LEFT  THORACENTESIS MEDICATIONS: None. COMPLICATIONS: None immediate. PROCEDURE: An ultrasound guided thoracentesis was thoroughly discussed with the patient and questions answered. The benefits, risks, alternatives and complications were also discussed. The patient understands and wishes to proceed with the procedure. Written consent was obtained. Ultrasound was performed to localize and  mark an adequate pocket of fluid in the left chest. The area was then prepped and draped in the normal sterile fashion. 1% Lidocaine was used for local anesthesia. Under ultrasound guidance a Safe-T-Centesis catheter was introduced. Thoracentesis was performed. The catheter was removed and a dressing applied. FINDINGS: A total of approximately 1.3 liters of turbid, amber fluid was removed. Samples were sent to the laboratory as requested by the clinical team. IMPRESSION: Successful ultrasound guided diagnostic and therapeutic left thoracentesis yielding 1.3 liters of pleural fluid. Read by: Jeananne Rama, PA-C Electronically Signed   By: Richarda Overlie M.D.   On: 08/28/2015 15:56    ECG  NSR. Prolonged QT 406/515 ms    ASSESSMENT AND PLAN  Active Problems:   Acute on chronic combined systolic (congestive) and diastolic (congestive) heart failure (HCC)   Recurrent left pleural effusion   1. Acute on Chronic Combined Systolic + Diastolic CHF: patient with known EF of 45%/ NICM. She notes increased dyspnea, weight gain and abdominal distention since discharge 2 days ago. BNP is elevated at 1154.6. Her weight is up 14 lbs. She was 108 lb day of discharge and is now 122 lb. Exam is notable for decreased BS on the left (recurrent left pleural effusion) and basilar rales on the right. Her lasix was held at time of discharge and she was instructed to take Florinef for orthostatic hypotension. Use of Florinef increases sodium retention, thus likely exacerbating her HF. Her BP is stable in the ED. She needs to be restarted on lasix. Can  continue midodrine for orthostatic hypotension.  Would limit use of Florinef if possible.   2. Recurrent Left Pleural Effusion: suspect this is also likely contributing to her dyspnea. She underwent thoracentesis prior admission. She will likely need to be re- tapped.   3. Mitral Valve Disease: s/p recent MVR 07/2015.  4. H/O VT: s/p cardiac arrest 07/2015. In the setting of infiltrative cardiomyopathy/ NICM and prolonged QT. She is s/p recent ICD insertion 07/2015. She denies any shocks. No recurrent syncope.    Signed, Robbie Lis, PA-C 09/02/2015, 10:24 AM

## 2015-09-02 NOTE — ED Notes (Signed)
PA aware Vitamin K hold until INR result.

## 2015-09-02 NOTE — ED Notes (Signed)
Pt. reports SOB with chest tightness and dry cough onset yesterday worse this evening , denies fever or chills , her cardiologist is Dr. Graciela Husbands.

## 2015-09-02 NOTE — ED Notes (Addendum)
Patient family advised Per PA  Patient NPO. Patient given mouth swabs patient states she does not want to use. Patient and family advised patient must keep BiPAP on. Patient refuses to keep mask on. Mask at bedside.  PA made aware Cardiology at bedside.

## 2015-09-02 NOTE — Telephone Encounter (Signed)
Returned call & left msg advising ondansetron OK to prescribe.

## 2015-09-02 NOTE — ED Notes (Signed)
Patient has BIPAP on. MD at bedside.

## 2015-09-02 NOTE — ED Notes (Signed)
Chris PA at bedside   

## 2015-09-02 NOTE — ED Notes (Signed)
Patient placed back on BiPAP.

## 2015-09-02 NOTE — H&P (Signed)
[facility] History and Physical  Diane [name] BJY:[id number] DOB: 03-Nov-1960 DOA: 09/02/2015  Referring physician: law PCP: Darrow Bussing, MD   Chief Complaint: sob chest pain  HPI: Diane [name] is a 55 y.o. female with past medical history that includes recent MV repair and prolonged hospitalization after VT/PA arrest status post ICD placement presents to the emergency department with the chief complaint worsening shortness of breath and the 2 days since her discharge. Initial evaluation in the emergency department reveals acute respiratory failure related to acute on chronic combined diastolic systolic heart failure and worsening pleural effusion.  Information obtained from the patient and the daughter who is at the bedside. She states when she was discharged she still had pleural effusions that the doctor said "will monitor and see how your heart does". She reports worsening shortness of breath lower extremity edema and weight gain starting yesterday. He does admit to not taking her Lasix as she suffered with low blood pressure. In addition she complains of chest pain epigastric nonradiating nausea no vomiting. She denies fever chills headache syncope or near-syncope. He denies dysuria hematuria frequency or urgency.  Emergency department she is afebrile no dynamically stable and requiring BiPAP  Review of Systems:  10 point review of systems complete and all systems are negative except as indicated in the history of present illness  Past Medical History  Diagnosis Date  . Severe mitral regurgitation 07/03/2015  . Chronic combined systolic and diastolic CHF (congestive heart failure) (HCC) 07/03/2015    Grade 3 diastolic dysfunction.  LVEF 40-45% 05/2015.  Marland Kitchen Aortic aneurysm (HCC)   . Patent ductus arteriosus 07/16/2015  . Depression     SOME DEPRESSION (ON MEDS FOR 2 YRS) WHEN HER MOTHER PASSED  . 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 07/22/2015    26  mm Sorin Memo 3D ring annuloplasty with ligation of patent ductus arteriosus and clipping of LA appendage   Past Surgical History  Procedure Laterality Date  . Ovarian cyst removal    . Tee without cardioversion N/A 07/07/2015    Procedure: TRANSESOPHAGEAL ECHOCARDIOGRAM (TEE);  Surgeon: Chilton Si, MD;  Location: [facility] ENDOSCOPY;  Service: Cardiovascular;  Laterality: N/A;  . Cardiac catheterization N/A 07/08/2015    Procedure: Right/Left Heart Cath and Coronary Angiography;  Surgeon: Marykay Lex, MD;  Location: [facility] INVASIVE CV LAB;  Service: Cardiovascular;  Laterality: N/A;  . Mitral valve repair N/A 07/22/2015    Procedure: MITRAL VALVE REPAIR;  Surgeon: Purcell Nails, MD;  Location: [facility] OR;  Service: Open Heart Surgery;  Laterality: N/A;  26 Sorin Memo 3D  . Tee without cardioversion N/A 07/22/2015    Procedure: TRANSESOPHAGEAL ECHOCARDIOGRAM (TEE);  Surgeon: Purcell Nails, MD;  Location[facility] OR;  Service: Open Heart Surgery;  Laterality: N/A;  . Patent ductus arterious repair N/A 07/22/2015    Procedure: PATENT DUCTUS ARTERIOSUS (PDA) ligation;  Surgeon: Purcell Nails, MD;  Location: [facility] OR;  Service: Open Heart Surgery;  Laterality: N/A;  . Clipping of atrial appendage  07/22/2015    Procedure: CLIPPING OF LEFT ATRIAL APPENDAGE;  Surgeon: Purcell Nails, MD;  Location: [facility] OR;  Service: Open Heart Surgery;;  35 Atriclip Pro  . Ep implantable device N/A 08/15/2015    Procedure: ICD Implant;  Surgeon: Will Jorja Loa, MD;  Location: [facility] INVASIVE CV LAB;  Service: Cardiovascular;  Laterality: N/A;   Social History:  reports that she has never smoked. She has never used smokeless tobacco. She reports  that she does not drink alcohol or use illicit drugs. Is at home with her daughter she is independent with ADLs Allergies  Allergen Reactions  . Vicodin [Hydrocodone-Acetaminophen] Other (See Comments)    "she feels out of it", hallucinating  . Adhesive [Tape] Itching and Rash  .  Silicone Itching and Rash    TAPE ALLERGY/EKG STICKER ALLERGY, Please use "paper" tape only    Family History  Problem Relation Age of Onset  . Hypertension Mother      Prior to Admission medications   Medication Sig Start Date End Date Taking? Authorizing Provider  buPROPion (WELLBUTRIN) 75 MG tablet Take 0.5 tablets (37.5 mg total) by mouth 2 (two) times daily. 08/28/15  Yes Shanker Levora Dredge, MD  fludrocortisone (FLORINEF) 0.1 MG tablet Take 2 tablets (0.2 mg total) by mouth 2 (two) times daily. 08/28/15  Yes Shanker Levora Dredge, MD  midodrine (PROAMATINE) 10 MG tablet Take 1 tablet (10 mg total) by mouth 3 (three) times daily. 08/28/15  Yes Shanker Levora Dredge, MD  pantoprazole (PROTONIX) 40 MG tablet Take 1 tablet (40 mg total) by mouth daily at 12 noon. 08/28/15  Yes Shanker Levora Dredge, MD  warfarin (COUMADIN) 1 MG tablet Take 2 tablets (2 mg total) by mouth daily at 6 PM. Start 2/11-HOLD dose 2/10 08/30/15  Yes Shanker Levora Dredge, MD  feeding supplement (BOOST / RESOURCE BREEZE) LIQD Take 1 Container by mouth 2 (two) times daily between meals. Patient not taking: Reported on 09/02/2015 08/28/15   Maretta Bees, MD  sodium chloride 1 g tablet Take 1 tablet (1 g total) by mouth 2 (two) times daily with a meal. Patient not taking: Reported on 09/02/2015 08/28/15   Maretta Bees, MD  traMADol (ULTRAM) 50 MG tablet Take 1 tablet (50 mg total) by mouth every 8 (eight) hours as needed for severe pain. Patient not taking: Reported on 09/02/2015 08/28/15   Maretta Bees, MD   Physical Exam: Filed Vitals:   09/02/15 1245 09/02/15 1300 09/02/15 1345 09/02/15 1400  BP: 117/66 132/80 120/57 128/68  Pulse: 96 96 96 96  Temp:      TempSrc:      Resp: 20 23 14 20   Height:      Weight:      SpO2: 100% 100% 100% 100%    Wt Readings from Last 3 Encounters:  09/02/15 55.339 kg (122 lb)  08/29/15 48.988 kg (108 lb)  07/31/15 58.06 kg (128 lb)    General:  Appears slightly anxious somewhat pale but  comfortable Eyes: PERRL, normal lids, irises & conjunctiva ENT: grossly normal hearing, lips & tongue Neck: no LAD, masses or thyromegaly Cardiovascular: RRR, no m/r/g. 2+ lower extremity edema pression stockings in place  Respiratory: Moderate to severe increased work of breathing with conversation. Not able to complete sentences. Breath sounds quite diminished on left distant on right right base with fine crackles no wheeze no rhonchi Abdomen: soft, ntnd nontender Skin: no rash or induration seen on limited exam, healing incision chest Musculoskeletal: grossly normal tone BUE/BLE Psychiatric: grossly normal mood and affect, speech fluent and appropriate Neurologic: grossly non-focal. Very soft-spoken but clear           Labs on Admission:  Basic Metabolic Panel:  Recent Labs Lab 09/02/15 0415  NA 136  K 3.5  CL 98*  CO2 25  GLUCOSE 111*  BUN 5*  CREATININE 0.54  CALCIUM 9.1   Liver Function Tests:  Recent Labs Lab 08/29/15 0550  PROT 5.5*   No results for input(s): LIPASE, AMYLASE in the last 168 hours. No results for input(s): AMMONIA in the last 168 hours. CBC:  Recent Labs Lab 08/27/15 0602 09/02/15 0415  WBC 7.2 7.1  HGB 10.0* 10.6*  HCT 31.3* 33.9*  MCV 95.4 97.1  PLT PLATELET CLUMPS NOTED ON SMEAR, UNABLE TO ESTIMATE 300   Cardiac Enzymes: No results for input(s): CKTOTAL, CKMB, CKMBINDEX, TROPONINI in the last 168 hours.  BNP (last 3 results)  Recent Labs  11/21/14 2324 09/02/15 0415  BNP 196.6* 1154.6*    ProBNP (last 3 results) No results for input(s): PROBNP in the last 8760 hours.  CBG: No results for input(s): GLUCAP in the last 168 hours.  Radiological Exams on Admission: Dg Chest 2 View  09/02/2015  CLINICAL DATA:  Shortness of breath, chest tightness, CHF. Surgery in January. EXAM: CHEST  2 VIEW COMPARISON:  08/29/2015 FINDINGS: Postoperative changes in the mediastinum. Cardiac pacemaker. Bilateral pleural effusions with basilar  atelectasis or consolidation, greater on the left. There is progression since previous study, particularly on the left. No pneumothorax. Probable cardiac enlargement although the heart shadow is obscured by the parenchymal process. IMPRESSION: Bilateral pleural effusions with basilar atelectasis or consolidation demonstrating progression since previous study. Electronically Signed   By: Burman Nieves M.D.   On: 09/02/2015 04:31    EKG: Independently reviewed. Normal sinus rhythm Low voltage QRS Left anterior fascicular block Septal infarct , age undetermined  Assessment/Plan Principal Problem:   Acute respiratory failure (HCC) Active Problems:   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   Acute on chronic combined systolic (congestive) and diastolic (congestive) heart failure (HCC)   Atrial fibrillation (HCC)   Recurrent left pleural effusion   Hypotension  #1. Acute respiratory failure related to acute on chronic combined diastolic systolic heart failure in the setting of worsening pleural effusion. Chest x-ray with worsening pleural effusion. BNP 1154.6. Improved with BiPAP and IV Lasix in the emergency department. At the time of admission oxygen saturation level greater than 90% on 6 L nasal cannula. Evaluated by critical care and pulmonology will recommend; -Admit to step down -Continue IV Lasix defer dose to cards -Wean oxygen as able -Repeat chest x-ray in the morning for evaluation of need for thoracentesis  #2. Acute on chronic combined systolic and diastolic heart failure. Patient with known EF of 45%. BNP elevated 14 pound weight gain 2 days. Of note weight was 108 on day of discharge 2 days ago currently 122. Evaluated by cardiology who recommends -Continue Lasix IV. Defer dose to cardiology -Florinef -Repeat chest x-ray -Intake and output -Daily weights -Unable to tolerate beta blocker or ACE inhibitor  #3. Recurrent left pleural effusion. Per chest  x-ray worsening. Consistent  with presentation. Discharge 2 days ago with same. Chart review indicates she underwent thoracentesis during her last hospitalization revealing exudative effusion with negative cytology and cultures. Evaluated by pulmonology/critical care opine reassessing tomorrow with chest x-ray  -Repeat chest x-ray in the a.m. to evaluate for need for thoracentesis -Will likely need repeat thoracentesis -Thoracentesis will be done sooner if emergently needed per pulmonology  #4. Status post recent MVR. On Coumadin. INR 2.84. -Coumadin per pharmacy  #5  s/p cardiac arrest 07/2015. In the setting of infiltrative cardiomyopathy/ NICM and prolonged QT. She is s/p recent ICD insertion 07/2015. She denies any shocks. No recurrent syncope.   #6 A. Fib [place] score 3. On coumadin EKG SR. rate controlled -Monitor on telemetry  #  7. Hypotension. Stable in the emergency department. Home meds include midodrine and florinef. BP 128/68 -monitor closely given IV lasix -orthostatic VS in am Cardiology Critical care consult   Code Status: full DVT Prophylaxis: Family Communication: daughter at bedside Disposition Plan: home when ready  Time spent: 60 minutes  [facility]

## 2015-09-02 NOTE — Consult Note (Signed)
Name: Diane [name] MRN: [id number] DOB: 03-Apr-1961    ADMISSION DATE:  09/02/2015 CONSULTATION DATE:  09/02/15  REFERRING MD :  EDP  CHIEF COMPLAINT:  SOB   HISTORY OF PRESENT ILLNESS:  Diane [name] is a 55 y.o. female with a PMH as outlined below including recent MV repair 07/22/15 and prolonged hospitalization (08/05/15 through 08/29/15) for VT / PEA arrest s/p ICD placement 08/15/15. During that admission she had large left pleural effusion which was drained via thoracentesis 08/11/15 (1L exudative with negative cytology).  Also had repeat left thora 08/28/15 which yielded 1.3L (also exudative with negative cytology). She had been doing fine when she first got home, but the following day began to experience SOB and increasing fatigue.  SOB worse while lying supine and with exertion, mildly relieved with rest.  She also had non-productive dry hacking cough and decreased appetite.  Cough became so frequent that it began to make her nauseous.  She also had LE edema that had gradually been worsening.    Symptoms progressed over the weekend and on morning of 02/14, she was much more uncomfortable and felt she needed medical attention.  She came to [facility] ED where CXR revealed recurrent bilateral pleural effusions, L > R.  BNP elevated at 1154, INR 3.22.  Additional labs unremarkable.  Weight at discharge 108lbs and on ED presentation today, weight up to 122lbs.  PCCM was called for further recommendations on pleural effusion management.  PAST MEDICAL HISTORY :   has a past medical history of Severe mitral regurgitation (07/03/2015); Chronic combined systolic and diastolic CHF (congestive heart failure) (HCC) (07/03/2015); Aortic aneurysm (HCC); Patent ductus arteriosus (07/16/2015); Depression; and 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 (07/22/2015).  has past surgical history that includes Ovarian cyst removal; TEE without cardioversion (N/A, 07/07/2015); Cardiac  catheterization (N/A, 07/08/2015); Mitral valve repair (N/A, 07/22/2015); TEE without cardioversion (N/A, 07/22/2015); Patent ductus arterious repair (N/A, 07/22/2015); Clipping of atrial appendage (07/22/2015); and Cardiac catheterization (N/A, 08/15/2015). Prior to Admission medications   Medication Sig Start Date End Date Taking? Authorizing Provider  buPROPion (WELLBUTRIN) 75 MG tablet Take 0.5 tablets (37.5 mg total) by mouth 2 (two) times daily. 08/28/15  Yes Shanker Levora Dredge, MD  fludrocortisone (FLORINEF) 0.1 MG tablet Take 2 tablets (0.2 mg total) by mouth 2 (two) times daily. 08/28/15  Yes Shanker Levora Dredge, MD  midodrine (PROAMATINE) 10 MG tablet Take 1 tablet (10 mg total) by mouth 3 (three) times daily. 08/28/15  Yes Shanker Levora Dredge, MD  pantoprazole (PROTONIX) 40 MG tablet Take 1 tablet (40 mg total) by mouth daily at 12 noon. 08/28/15  Yes Shanker Levora Dredge, MD  warfarin (COUMADIN) 1 MG tablet Take 2 tablets (2 mg total) by mouth daily at 6 PM. Start 2/11-HOLD dose 2/10 08/30/15  Yes Shanker Levora Dredge, MD  feeding supplement (BOOST / RESOURCE BREEZE) LIQD Take 1 Container by mouth 2 (two) times daily between meals. Patient not taking: Reported on 09/02/2015 08/28/15   Maretta Bees, MD  sodium chloride 1 g tablet Take 1 tablet (1 g total) by mouth 2 (two) times daily with a meal. Patient not taking: Reported on 09/02/2015 08/28/15   Maretta Bees, MD  traMADol (ULTRAM) 50 MG tablet Take 1 tablet (50 mg total) by mouth every 8 (eight) hours as needed for severe pain. Patient not taking: Reported on 09/02/2015 08/28/15   Maretta Bees, MD   Allergies  Allergen Reactions  . Vicodin [Hydrocodone-Acetaminophen] Other (See  Comments)    "she feels out of it", hallucinating  . Adhesive [Tape] Itching and Rash  . Silicone Itching and Rash    TAPE ALLERGY/EKG STICKER ALLERGY, Please use "paper" tape only    FAMILY HISTORY:  family history includes Hypertension in her mother. SOCIAL HISTORY:   reports that she has never smoked. She has never used smokeless tobacco. She reports that she does not drink alcohol or use illicit drugs.  REVIEW OF SYSTEMS:   All negative; except for those that are bolded, which indicate positives.  Constitutional: weight loss, weight gain, night sweats, fevers, chills, fatigue, weakness.  HEENT: headaches, sore throat, sneezing, nasal congestion, post nasal drip, difficulty swallowing, tooth/dental problems, visual complaints, visual changes, ear aches. Neuro: difficulty with speech, weakness, numbness, ataxia. CV:  chest pain, orthopnea, PND, swelling in lower extremities, dizziness, palpitations, syncope.  Resp: dry cough, hemoptysis, dyspnea, wheezing. GI  heartburn, indigestion, abdominal pain, nausea, vomiting, diarrhea, constipation, change in bowel habits, loss of appetite, hematemesis, melena, hematochezia.  GU: dysuria, change in color of urine, urgency or frequency, flank pain, hematuria. MSK: joint pain or swelling, decreased range of motion. Psych: change in mood or affect, depression, anxiety, suicidal ideations, homicidal ideations. Skin: rash, itching, bruising.    SUBJECTIVE:  Feels SOB, worse supine.  Had been unable to tolerate BiPAP due to dry throat.  VITAL SIGNS: Temp:  [97.9 F (36.6 C)-98.2 F (36.8 C)] 97.9 F (36.6 C) (02/14 0612) Pulse Rate:  [90-98] 92 (02/14 1030) Resp:  [14-32] 19 (02/14 1030) BP: (130-153)/(70-95) 138/79 mmHg (02/14 1030) SpO2:  [97 %-100 %] 100 % (02/14 1030) FiO2 (%):  [40 %] 40 % (02/14 0758) Weight:  [55.339 kg (122 lb)] 55.339 kg (122 lb) (02/14 0411)  PHYSICAL EXAMINATION: General: Middle aged female, resting in bed, in NAD. Neuro: A&O x 3, non-focal.  HEENT: [place]/AT. PERRL, sclerae anicteric. Cardiovascular: RRR, no M/R/G.  Lungs: Respirations shallow and unlabored.  Diminished BS left side with dullness to percussion and + egophany.  Faint crackles in right base. Abdomen: BS x 4, soft,  NT/ND.  Musculoskeletal: No gross deformities, 2+ edema BLE's.  Skin: Intact, warm, no rashes.    Recent Labs Lab 09/02/15 0415  NA 136  K 3.5  CL 98*  CO2 25  BUN 5*  CREATININE 0.54  GLUCOSE 111*    Recent Labs Lab 08/27/15 0602 09/02/15 0415  HGB 10.0* 10.6*  HCT 31.3* 33.9*  WBC 7.2 7.1  PLT PLATELET CLUMPS NOTED ON SMEAR, UNABLE TO ESTIMATE 300   Dg Chest 2 View  09/02/2015  CLINICAL DATA:  Shortness of breath, chest tightness, CHF. Surgery in January. EXAM: CHEST  2 VIEW COMPARISON:  08/29/2015 FINDINGS: Postoperative changes in the mediastinum. Cardiac pacemaker. Bilateral pleural effusions with basilar atelectasis or consolidation, greater on the left. There is progression since previous study, particularly on the left. No pneumothorax. Probable cardiac enlargement although the heart shadow is obscured by the parenchymal process. IMPRESSION: Bilateral pleural effusions with basilar atelectasis or consolidation demonstrating progression since previous study. Electronically Signed   By: Burman Nieves M.D.   On: 09/02/2015 04:31    STUDIES:  CXR 02/14 > bilateral effusions L > R with basilar atx.  SIGNIFICANT EVENTS  02/14 > presented to ED with SOB, increased LE edema, weight gain.  PCCM consulted for recs on bilateral effusions, L > R (had L thora 01/23 with 1L drained).  ASSESSMENT / PLAN:  Recurrent bilateral effusions, L > R - had same issue  last hospitalization with left sided thoracentesis that yielded 1L exudative semi-hemorrhagic effusion (was on heparin).  Also had repeat left thora 08/28/15 which yielded 1.3L (also exudative with negative cytology).  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 Plan: Diuresis as BP and SCr permit. Agree with BiPAP. Hold coumadin / heparin for today in anticipation of thora. Low dose vitamin K today given supratherapeutic INR. Repeat INR in AM. Will  evaluate left pleural space under [place] in AM 02/15 and plan for repeat left thoracentesis if INR normalized. Follow CXR.  AoC combined heart failure - echo from 08/12/15 with EF 55-60%, no RWMA's.  Severe MR with PDA - s/p MV repair and ligation of PDA 07/22/15. S/p PEA / VT arrest 08/05/15. Plan: Diuresis as BP and SCr permit. Cardiology following, appreciate assistance. Heparin per pharmacy once INR < 2.  Rest per primary team.   CC time:  30 minutes.   Rutherford Guys, [place] - C  Pulmonary & Critical Care Medicine Pager: [phone]  or [phone] 09/02/2015, 10:39 AM

## 2015-09-02 NOTE — ED Notes (Signed)
Patient placed on 4L patient request to keep BIPAP off.

## 2015-09-02 NOTE — ED Notes (Signed)
Respiratory notified that pt needs Bipap

## 2015-09-02 NOTE — ED Notes (Signed)
Admitting at bedside 

## 2015-09-02 NOTE — ED Notes (Signed)
Spoke with Pharmacy advised to Hold Vitamin K until INR resulted due to new valve replacement.

## 2015-09-02 NOTE — ED Notes (Signed)
Cards PA made aware Patient BP has been trending Downward. BP on arrival 140's last BP 124.

## 2015-09-02 NOTE — ED Notes (Signed)
Patient taken off BiPAP per cardiology MD request. Patient placed on 4L 02 Sats 100%

## 2015-09-03 ENCOUNTER — Inpatient Hospital Stay (HOSPITAL_COMMUNITY): Payer: BLUE CROSS/BLUE SHIELD

## 2015-09-03 ENCOUNTER — Telehealth: Payer: Self-pay | Admitting: Pharmacist Clinician (PhC)/ Clinical Pharmacy Specialist

## 2015-09-03 DIAGNOSIS — R0902 Hypoxemia: Secondary | ICD-10-CM | POA: Diagnosis present

## 2015-09-03 DIAGNOSIS — I951 Orthostatic hypotension: Secondary | ICD-10-CM

## 2015-09-03 DIAGNOSIS — I480 Paroxysmal atrial fibrillation: Secondary | ICD-10-CM

## 2015-09-03 DIAGNOSIS — J810 Acute pulmonary edema: Secondary | ICD-10-CM | POA: Diagnosis present

## 2015-09-03 LAB — BASIC METABOLIC PANEL
Anion gap: 13 (ref 5–15)
Anion gap: 11 (ref 5–15)
CHLORIDE: 91 mmol/L — AB (ref 101–111)
CHLORIDE: 95 mmol/L — AB (ref 101–111)
CO2: 34 mmol/L — ABNORMAL HIGH (ref 22–32)
CO2: 31 mmol/L (ref 22–32)
CREATININE: 0.54 mg/dL (ref 0.44–1.00)
CREATININE: 0.49 mg/dL (ref 0.44–1.00)
Calcium: 8.8 mg/dL — ABNORMAL LOW (ref 8.9–10.3)
Calcium: 8.9 mg/dL (ref 8.9–10.3)
GFR calc Af Amer: >60 mL/min (ref >60)
GFR calc Af Amer: >60 mL/min (ref >60)
GFR calc non Af Amer: >60 mL/min (ref >60)
GFR calc non Af Amer: >60 mL/min (ref >60)
GLUCOSE: 97 mg/dL (ref 65–99)
GLUCOSE: 98 mg/dL (ref 65–99)
POTASSIUM: 2.6 mmol/L — AB (ref 3.5–5.1)
POTASSIUM: 4 mmol/L (ref 3.5–5.1)
Sodium: 138 mmol/L (ref 135–145)
Sodium: 137 mmol/L (ref 135–145)

## 2015-09-03 LAB — BODY FLUID CELL COUNT WITH DIFFERENTIAL
EOS FL: 0 %
Lymphs, Fluid: 65 %
MONOCYTE-MACROPHAGE-SEROUS FLUID: 22 % — AB (ref 50–90)
Neutrophil Count, Fluid: 13 % (ref 0–25)
WBC FLUID: 491 uL (ref 0–1000)

## 2015-09-03 LAB — HEPARIN LEVEL (UNFRACTIONATED): Heparin Unfractionated: 0.23 IU/mL — ABNORMAL LOW (ref 0.30–0.70)

## 2015-09-03 LAB — MAGNESIUM: MAGNESIUM: 1.9 mg/dL (ref 1.7–2.4)

## 2015-09-03 LAB — PROTIME-INR
INR: 1.4 (ref 0.00–1.49)
Prothrombin Time: 17.2 seconds — ABNORMAL HIGH (ref 11.6–15.2)

## 2015-09-03 MED ORDER — MORPHINE SULFATE (PF) 2 MG/ML IV SOLN: 1.0000 mg | INTRAVENOUS | Status: DC | PRN

## 2015-09-03 MED ORDER — WARFARIN SODIUM 2 MG PO TABS
2.0000 mg | ORAL_TABLET | Freq: Once | ORAL | Status: AC
  Administered 2015-09-03: 2 mg via ORAL
  Filled 2015-09-03: qty 1

## 2015-09-03 MED ORDER — POTASSIUM CHLORIDE CRYS ER 20 MEQ PO TBCR
40.0000 meq | EXTENDED_RELEASE_TABLET | ORAL | Status: AC
  Administered 2015-09-03 (×2): 40 meq via ORAL
  Filled 2015-09-03 (×2): qty 2

## 2015-09-03 MED ORDER — HEPARIN (PORCINE) IN NACL 100-0.45 UNIT/ML-% IJ SOLN
850.0000 [IU]/h | INTRAMUSCULAR | Status: DC
  Administered 2015-09-03: 750 [IU]/h via INTRAVENOUS

## 2015-09-03 MED ORDER — HEPARIN (PORCINE) IN NACL 100-0.45 UNIT/ML-% IJ SOLN
750.0000 [IU]/h | INTRAMUSCULAR | Status: DC
  Administered 2015-09-03: 750 [IU]/h via INTRAVENOUS
  Filled 2015-09-03: qty 250

## 2015-09-03 MED ORDER — WARFARIN - PHARMACIST DOSING INPATIENT: Freq: Every day | Status: DC

## 2015-09-03 NOTE — Progress Notes (Signed)
PATIENT ID: [id number] with recurrent pleural effusion and recent mitral valve repair complicated by post-operative VT/PEA arrest s/p ICD placement and severe orthostatic hypotension here with volume overload in the setting of florinef and midodrine.    INTERVAL HISTORY: Diuresed -3L.   SUBJECTIVE:   Feeling much better today.  Breathing is back to baseline and edema is gone.   PHYSICAL EXAM Filed Vitals:   09/02/15 2012 09/03/15 0035 09/03/15 0500 09/03/15 0530  BP: 121/51 130/71 109/65   Pulse: 100  94   Temp: 98.2 F (36.8 C) 98.2 F (36.8 C) 97.4 F (36.3 C)   TempSrc: Oral Oral Oral   Resp: 19  18   Height:      Weight:    48.762 kg (107 lb 8 oz)  SpO2: 98%  100%    General:  Well-appearing.  NAD. Neck:  No JVD. Lungs:  Diminished at the L >R base.  No crackles or rhonchi Heart:  RRR.  II/Vi systolic murmur Abdomen:  Soft, NT, ND.  +BS Extremities:  No edmea  LABS: Lab Results  Component Value Date   TROPONINI 0.65* 08/06/2015   Results for orders placed or performed during the hospital encounter of 09/02/15 (from the past 24 hour(s))  Protime-INR     Status: Abnormal   Collection Time: 09/02/15  1:00 PM  Result Value Ref Range   Prothrombin Time 29.4 (H) 11.6 - 15.2 seconds   INR 2.84 (H) 0.00 - 1.49  Protime-INR     Status: Abnormal   Collection Time: 09/03/15  5:00 AM  Result Value Ref Range   Prothrombin Time 17.2 (H) 11.6 - 15.2 seconds   INR 1.40 0.00 - 1.49  Basic metabolic panel     Status: Abnormal   Collection Time: 09/03/15  5:00 AM  Result Value Ref Range   Sodium 138 135 - 145 mmol/L   Potassium 2.6 (LL) 3.5 - 5.1 mmol/L   Chloride 91 (L) 101 - 111 mmol/L   CO2 34 (H) 22 - 32 mmol/L   Glucose, Bld 97 65 - 99 mg/dL   BUN <5 (L) 6 - 20 mg/dL   Creatinine, Ser 3.70 0.44 - 1.00 mg/dL   Calcium 8.8 (L) 8.9 - 10.3 mg/dL   GFR calc non Af Amer >60 >60 mL/min   GFR calc Af Amer >60 >60 mL/min   Anion gap 13 5 - 15    Intake/Output Summary (Last 24  hours) at 09/03/15 1004 Last data filed at 09/02/15 2300  Gross per 24 hour  Intake    200 ml  Output   3550 ml  Net  -3350 ml    Telemetry: No events  ASSESSMENT AND PLAN:  Principal Problem:   Acute respiratory failure (HCC) Active Problems:   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   Acute on chronic combined systolic (congestive) and diastolic (congestive) heart failure (HCC)   Atrial fibrillation (HCC)   Recurrent left pleural effusion   Hypotension   # Volume overload: Diane [name] is now euvolemic after aggressive diuresis.  She is feeling much better.  She still has a large L pleural effusion with plans for thoracentesis.  Likely 2/2 florinef.  Echo pending to ensure there is no problem with her mitral valve or new systolic dysfunction. Potassium has been appropriately supplemented.  Will stop lasix and re-evaluate volume status in the AM.  She likely will not need long term lasix now that florinef has been discontinued.  #  S/p MVR: Echo today.  # Orthostatic hypotension: Improved and she is stable on midodrine.  # Possible infiltrative cardiomyopathy: Diane [name] MRI showed diffuse delayed enhancement in the LV.  This is concerning for infiltrative cardiomyopathy.  Her EKG and echo is not consistent with amyloidosis.  We would like to have an endomyocardial biopsy, but this cannot be done here.  Will arrange for outpatient biopsy at [facility]  # Paroxysmal atrial fibrillation:  She maintains sinus rhythm.  Will restart warfarin post thoracentesis.  Time spent: 30 minutes-Greater than 50% of this time was spent in counseling, explanation of diagnosis, planning of further management, and coordination of care.    Diane [name] C. Duke Salvia, MD, [facility] 09/03/2015 10:04 AM

## 2015-09-03 NOTE — Progress Notes (Signed)
ANTICOAGULATION CONSULT NOTE - Follow Up Consult  Pharmacy Consult for Heparin (while Warfarin on hold and INR <2) Indication: atrial fibrillation  Allergies  Allergen Reactions  . Vicodin [Hydrocodone-Acetaminophen] Other (See Comments)    "she feels out of it", hallucinating  . Adhesive [Tape] Itching and Rash  . Silicone Itching and Rash    TAPE ALLERGY/EKG STICKER ALLERGY, Please use "paper" tape only    Patient Measurements: Height: 5\' 2"  (157.5 cm) Weight: 122 lb (55.339 kg) IBW/kg (Calculated) : 50.1  Vital Signs: Temp: 97.4 F (36.3 C) (02/15 0500) Temp Source: Oral (02/15 0500) BP: 109/65 mmHg (02/15 0500) Pulse Rate: 94 (02/15 0500)  Labs:  Recent Labs  09/02/15 0415 09/02/15 1300 09/03/15 0500  HGB 10.6*  --   --   HCT 33.9*  --   --   PLT 300  --   --   LABPROT  --  29.4* 17.2*  INR  --  2.84* 1.40  CREATININE 0.54  --  0.54    Estimated Creatinine Clearance: 62.1 mL/min (by C-G formula based on Cr of 0.54).   Assessment: Awaiting thoracentesis, INR <2, starting heparin  Goal of Therapy:  Heparin level 0.3-0.7 units/ml Monitor platelets by anticoagulation protocol: Yes   Plan:  -Start heparin drip at 750 units/hr -1300 HL  Abran Duke 09/03/2015,6:05 AM

## 2015-09-03 NOTE — Progress Notes (Signed)
ANTICOAGULATION CONSULT NOTE - Follow Up Consult  Pharmacy Consult for heparin / coumadin Indication: Afib  Allergies  Allergen Reactions  . Vicodin [Hydrocodone-Acetaminophen] Other (See Comments)    "she feels out of it", hallucinating  . Adhesive [Tape] Itching and Rash  . Silicone Itching and Rash    TAPE ALLERGY/EKG STICKER ALLERGY, Please use "paper" tape only    Patient Measurements: Height: 5\' 2"  (157.5 cm) Weight: 107 lb 8 oz (48.762 kg) IBW/kg (Calculated) : 50.1  Vital Signs: Temp: 98.6 F (37 C) (02/15 1200) Temp Source: Oral (02/15 1200) BP: 105/63 mmHg (02/15 1200) Pulse Rate: 100 (02/15 1200)  Labs:  Recent Labs  09/02/15 0415 09/02/15 1300 09/03/15 0500  HGB 10.6*  --   --   HCT 33.9*  --   --   PLT 300  --   --   LABPROT  --  29.4* 17.2*  INR  --  2.84* 1.40  CREATININE 0.54  --  0.54    Estimated Creatinine Clearance: 60.5 mL/min (by C-G formula based on Cr of 0.54).   Assessment: 55 yo female with recent MV repair in January. Currently on heparin gtt for thoracentesis bridging. Given Vit K yesterday. INR was therapeutic on admit at 2.84. INR was 1.4 this am. Heparin held and thoracentesis done at 1430. Plan to restart heparin 1 hr after procedure. CCM also ok with restarting coumadin tonight. On coumadin 2mg  daily PTA (last dose was 2/13) Hgb low at 10.6, plts wnl.  Goal of Therapy:  INR 2-3 Heparin level 0.3-0.7 units/ml Monitor platelets by anticoagulation protocol: Yes   Plan:  Restart heparin gtt at 750 units/hr at 1530 Give coumadin 2mg  PO x 1 tonight Check 6 hr HL at 2130 Monitor daily HL / INR, CBC, s/s of bleed

## 2015-09-03 NOTE — Progress Notes (Addendum)
[place] TEAM 1 - Stepdown/ICU TEAM PROGRESS NOTE  Diane [name] SNK:[id number] DOB: 1961/06/06 DOA: 09/02/2015 PCP: Darrow Bussing, MD  Admit HPI / Brief Narrative: 55 y.o. female with a history of recent MV repair and prolonged hospitalization after VT arrest status post ICD placement who presented to the ED with worsening shortness of breath. 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  HPI/Subjective: The patient appears comfortable in bed but she does report ongoing dyspnea.  She requests a thoracentesis.  She denies chest pain fevers chills nausea vomiting or abdominal pain.  Assessment/Plan:  Acute hypoxic respiratory failure due to pulmonary edema and pleural effusion Thoracentesis performed 2/9 yielding 1.3L of transudative fluid - effusion stable on f/u CXR today but quite large, and pt c/o severe persisting dyspnea - PCCM reversed her coumadin in anticipation of repeat thora - pt in favor/desires thora   Volume overload  EF 55-60% via TTE August 12, 2015 therefore systolic fxn recovered post arrest - weight 108 pounds at discharge 09/01/15 and 122 on date of readmission 2 days later - 107.5 pounds today - ?due to florinef - holding florinef for now   ?Infiltrative Cardiomyopathy Evaluation per Cardiology   Hypotension Chronically on midodrine and florinef - blood pressure stable at present  Hypokalemia  Due to diuretic - replace and follow - check Mg  Status post mitral valve repair 07/22/2015 Due to severe mitral regurg - f/u TTEs have confirmed stable valve function - TCTS has seen in f/u multiple times   Status post cardiac arrest January 2017 In the setting of infiltrative cardiomyopathy and prolonged QT - status post ICD implantation January 2017  Chronic Atrial fibrillation CHA2DS2 - VASc is 3 - IV heparin while coumadin on hold for thora   Code  Status: FULL Family Communication: no family present at time of exam Disposition Plan: SDU  Consultants: Cardiology Critical care  Procedures: None  Antibiotics: None  DVT prophylaxis: IV heparin   Objective: Blood pressure 109/65, pulse 94, temperature 97.4 F (36.3 C), temperature source Oral, resp. rate 18, height 5\' 2"  (1.575 m), weight 48.762 kg (107 lb 8 oz), SpO2 100 %.  Intake/Output Summary (Last 24 hours) at 09/03/15 1016 Last data filed at 09/02/15 2300  Gross per 24 hour  Intake    200 ml  Output   3550 ml  Net  -3350 ml   Exam: General: No acute respiratory distress at rest  Lungs: Poor air movement bilateral bases and left mid lung fields - no wheeze  Cardiovascular: Regular rate and rhythm without murmur gallop or rub normal S1 and S2 Abdomen: Nontender, nondistended, soft, bowel sounds positive, no rebound, no ascites, no appreciable mass Extremities: No significant cyanosis, clubbing, or edema bilateral lower extremities  Data Reviewed:  Basic Metabolic Panel:  Recent Labs Lab 09/02/15 0415 09/03/15 0500  NA 136 138  K 3.5 2.6*  CL 98* 91*  CO2 25 34*  GLUCOSE 111* 97  BUN 5* <5*  CREATININE 0.54 0.54  CALCIUM 9.1 8.8*    CBC:  Recent Labs Lab 09/02/15 0415  WBC 7.1  HGB 10.6*  HCT 33.9*  MCV 97.1  PLT 300    Liver Function Tests:  Recent Labs Lab 08/29/15 0550  PROT 5.5*   Coags:  Recent Labs Lab 08/28/15 0708 08/29/15 0550 09/02/15 1300 09/03/15 0500  INR 2.49* 3.22* 2.84* 1.40   Cardiac Enzymes: No results for input(s): CKTOTAL,  CKMB, CKMBINDEX, TROPONINI in the last 168 hours.   Recent Results (from the past 240 hour(s))  Culture, body fluid-bottle     Status: None   Collection Time: 08/28/15  3:30 PM  Result Value Ref Range Status   Specimen Description FLUID LEFT PLEURAL  Final   Special Requests BOTTLES DRAWN AEROBIC AND ANAEROBIC 10CCS  Final   Culture NO GROWTH 5 DAYS  Final   Report Status  09/02/2015 FINAL  Final  Gram stain     Status: None   Collection Time: 08/28/15  3:30 PM  Result Value Ref Range Status   Specimen Description FLUID LEFT PLEURAL  Final   Special Requests NONE  Final   Gram Stain   Final    ABUNDANT WBC PRESENT, PREDOMINANTLY MONONUCLEAR NO ORGANISMS SEEN    Report Status 08/28/2015 FINAL  Final     Studies:   Recent x-ray studies have been reviewed in detail by the Attending Physician  Scheduled Meds:  Scheduled Meds: . buPROPion  37.5 mg Oral BID  . furosemide  40 mg Intravenous BID  . midodrine  10 mg Oral TID WC  . pantoprazole  40 mg Oral Q1200  . potassium chloride  40 mEq Oral Q4H  . sodium chloride flush  3 mL Intravenous Q12H    Time spent on care of this patient: 35 mins   Bandy Honaker T , MD   [facility] Office  [phone] Pager - Text Page per Loretha Stapler as per below:  On-Call/Text Page:      [email or web address]      password [email or web address]  If 7PM-7AM, please contact night-coverage [email or web address] Password [email or web address] 09/03/2015, 10:16 AM   LOS: 1 day

## 2015-09-03 NOTE — Procedures (Signed)
Thoracentesis Procedure Note  Pre-operative Diagnosis: Left Pleural Effusion  Post-operative Diagnosis: same  Indications: Diagnostic and Therapeutic Thoracentecis  Procedure Details  Consent: Informed consent was obtained. Risks of the procedure were discussed including: infection, bleeding, pain, pneumothorax.  Under sterile conditions the patient was positioned. Betadine solution and sterile drapes were utilized.  1% buffered lidocaine was used to anesthetize the 8th rib space. Fluid was obtained without any difficulties and minimal blood loss.  A dressing was applied to the wound and wound care instructions were provided.   Findings 1400 ml of serous sanguinous / semi-bloody fluid pleural fluid was obtained. A sample was sent to Pathology for cytogenetics, flow, and cell counts, as well as for infection analysis.  Complications:  None; patient tolerated the procedure well.          Condition: stable  Plan A follow up chest x-ray was ordered. Bed Rest for 0 hours. Tylenol 650 mg. for pain.  Ultrasound used to quantify effusion and visualize optimal needle entry point.   Rutherford Guys, [place] [facility] Pager: [phone]  or [phone] 09/03/2015, 2:28 PM    Attending Attestation: I was present for the entire procedure.  Alyson Reedy, M.D. [facility] Pulmonary/Critical Care Medicine. Pager: [phone]. After hours pager: [phone].

## 2015-09-03 NOTE — Progress Notes (Signed)
Name: Diane [name] MRN: [id number] DOB: 05-25-1961    ADMISSION DATE:  09/02/2015 CONSULTATION DATE:  09/02/15  REFERRING MD :  EDP  CHIEF COMPLAINT:  SOB   HISTORY OF PRESENT ILLNESS:  Diane [name] is a 55 y.o. female with a PMH as outlined below including recent MV repair 07/22/15 and prolonged hospitalization (08/05/15 through 08/29/15) for VT / PEA arrest s/p ICD placement 08/15/15. During that admission she had large left pleural effusion which was drained via thoracentesis 08/11/15 (1L exudative with negative cytology).  Also had repeat left thora 08/28/15 which yielded 1.3L (also exudative with negative cytology). She had been doing fine when she first got home, but the following day began to experience SOB and increasing fatigue.  SOB worse while lying supine and with exertion, mildly relieved with rest.  She also had non-productive dry hacking cough and decreased appetite.  Cough became so frequent that it began to make her nauseous.  She also had LE edema that had gradually been worsening.    Symptoms progressed over the weekend and on morning of 02/14, she was much more uncomfortable and felt she needed medical attention.  She came to [facility] ED where CXR revealed recurrent bilateral pleural effusions, L > R.  BNP elevated at 1154, INR 3.22.  Additional labs unremarkable.  Weight at discharge 108lbs and on ED presentation today, weight up to 122lbs.  PCCM was called for further recommendations on pleural effusion management.  SUBJECTIVE:  Feels much better, off BiPAP, on 2L [place].  VITAL SIGNS: Temp:  [97.4 F (36.3 C)-98.6 F (37 C)] 98.6 F (37 C) (02/15 1200) Pulse Rate:  [94-102] 100 (02/15 1200) Resp:  [15-21] 20 (02/15 1200) BP: (105-133)/(50-117) 105/63 mmHg (02/15 1200) SpO2:  [94 %-100 %] 100 % (02/15 0500) Weight:  [48.762 kg (107 lb 8 oz)] 48.762 kg (107 lb 8 oz) (02/15 0530)  PHYSICAL EXAMINATION: General: Middle aged female, resting in bed, in NAD. Neuro: A&O x 3,  non-focal.  HEENT: [place]/AT. PERRL, sclerae anicteric. Cardiovascular: RRR, no M/R/G.  Lungs: Respirations shallow and unlabored.  Diminished BS left side with dullness to percussion and + egophany.  Faint crackles in right base. Abdomen: BS x 4, soft, NT/ND.  Musculoskeletal: No gross deformities, 2+ edema BLE's.  Skin: Intact, warm, no rashes.  Recent Labs Lab 09/02/15 0415 09/03/15 0500  NA 136 138  K 3.5 2.6*  CL 98* 91*  CO2 25 34*  BUN 5* <5*  CREATININE 0.54 0.54  GLUCOSE 111* 97   Recent Labs Lab 09/02/15 0415  HGB 10.6*  HCT 33.9*  WBC 7.1  PLT 300   Dg Chest 2 View  09/02/2015  CLINICAL DATA:  Shortness of breath, chest tightness, CHF. Surgery in January. EXAM: CHEST  2 VIEW COMPARISON:  08/29/2015 FINDINGS: Postoperative changes in the mediastinum. Cardiac pacemaker. Bilateral pleural effusions with basilar atelectasis or consolidation, greater on the left. There is progression since previous study, particularly on the left. No pneumothorax. Probable cardiac enlargement although the heart shadow is obscured by the parenchymal process. IMPRESSION: Bilateral pleural effusions with basilar atelectasis or consolidation demonstrating progression since previous study. Electronically Signed   By: Burman Nieves M.D.   On: 09/02/2015 04:31   Dg Chest Port 1 View  09/03/2015  CLINICAL DATA:  Pleural effusion EXAM: PORTABLE CHEST 1 VIEW COMPARISON:  September 02, 2015 FINDINGS: There is a sizable pleural effusion on the left with left mid and lower lung zone airspace consolidation. There is a much smaller right  effusion with right base atelectasis. There is cardiac enlargement with pulmonary vascularity within normal limits, stable. Pacemaker leads are attached to the right atrium and right ventricle. Patient is status post mitral valve replacement. A left atrial appendage clamp is present. No adenopathy is apparent. No apparent pneumothorax. IMPRESSION: Bilateral pleural effusions,  considerably larger on the left than on the right, stable. Left mid lower lung zone airspace consolidation. Patchy atelectasis right base. No change in cardiac silhouette. No pneumothorax. Electronically Signed   By: Bretta Bang III M.D.   On: 09/03/2015 07:26   STUDIES:  CXR 02/14 > bilateral effusions L > R with basilar atx.  SIGNIFICANT EVENTS  02/14 > presented to ED with SOB, increased LE edema, weight gain.  PCCM consulted for recs on bilateral effusions, L > R (had L thora 01/23 with 1L drained).  ASSESSMENT / PLAN:  Recurrent bilateral effusions, L > R - had same issue last hospitalization with left sided thoracentesis that yielded 1L exudative semi-hemorrhagic effusion (was on heparin).  Also had repeat left thora 08/28/15 which yielded 1.3L (also exudative with negative cytology).  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 Plan: Diuresis as BP and SCr permit. D/C BiPAP. Hold coumadin / heparin for today in anticipation of thora then may restart heparin 1 hour post procedure. Repeat INR in AM. Significant amount of fluid on CXR and U/S. May restart coumadin tonight. Follow CXR.  AoC combined heart failure - echo from 08/12/15 with EF 55-60%, no RWMA's.  Severe MR with PDA - s/p MV repair and ligation of PDA 07/22/15. S/p PEA / VT arrest 08/05/15. Plan: Diuresis as BP and SCr permit. Cardiology following, appreciate assistance. Heparin per pharmacy once INR < 2.  Hypoxemia:  - Supplemental O2 for sat of 88-92%.  - Will need an ambulatory desaturation study prior to discharge for evaluation of home O2.  Alyson Reedy, M.D. [facility] Pulmonary/Critical Care Medicine. Pager: [phone]. After hours pager: [phone].  09/03/2015, 2:14 PM

## 2015-09-03 NOTE — Progress Notes (Signed)
ANTICOAGULATION CONSULT NOTE - Follow Up Consult  Pharmacy Consult for heparin / coumadin Indication: Afib  Allergies  Allergen Reactions  . Vicodin [Hydrocodone-Acetaminophen] Other (See Comments)    "she feels out of it", hallucinating  . Adhesive [Tape] Itching and Rash  . Silicone Itching and Rash    TAPE ALLERGY/EKG STICKER ALLERGY, Please use "paper" tape only    Patient Measurements: Height: 5\' 2"  (157.5 cm) Weight: 107 lb 8 oz (48.762 kg) IBW/kg (Calculated) : 50.1  Vital Signs: Temp: 97.5 F (36.4 C) (02/15 2000) Temp Source: Oral (02/15 2000) BP: 124/75 mmHg (02/15 2000) Pulse Rate: 99 (02/15 2000)  Labs:  Recent Labs  09/02/15 0415 09/02/15 1300 09/03/15 0500 09/03/15 1559 09/03/15 2133  HGB 10.6*  --   --   --   --   HCT 33.9*  --   --   --   --   PLT 300  --   --   --   --   LABPROT  --  29.4* 17.2*  --   --   INR  --  2.84* 1.40  --   --   HEPARINUNFRC  --   --   --   --  0.23*  CREATININE 0.54  --  0.54 0.49  --     Estimated Creatinine Clearance: 60.5 mL/min (by C-G formula based on Cr of 0.49).   Assessment: 55 yo female with recent MV repair in January. Currently on heparin gtt for thoracentesis bridging. Given Vit K yesterday. INR was therapeutic on admit at 2.84. INR was 1.4 this am. Heparin held and thoracentesis done at 1430. Plan to restart heparin 1 hr after procedure. CCM also ok with restarting coumadin tonight. On coumadin 2mg  daily PTA (last dose was 2/13) Hgb low at 10.6, plts wnl. HL 0.23 on 750 units/hr  Goal of Therapy:  INR 2-3 Heparin level 0.3-0.7 units/ml Monitor platelets by anticoagulation protocol: Yes   Plan:  Increase heparin gtt to 850 units/hr  Check 6 hr HL Monitor daily HL / INR, CBC, s/s of bleed

## 2015-09-03 NOTE — Telephone Encounter (Signed)
Cicero Duck from [facility] 2/14 that Ms. Folz's INR was drawn in their office - result of 3.3.  She was calling to [place] as we monitor.    Tried calling patient during the day, no VM was set up, unable to leave message.    See now that pt back in hospital

## 2015-09-04 ENCOUNTER — Ambulatory Visit (HOSPITAL_COMMUNITY): Payer: BLUE CROSS/BLUE SHIELD

## 2015-09-04 DIAGNOSIS — I059 Rheumatic mitral valve disease, unspecified: Secondary | ICD-10-CM

## 2015-09-04 DIAGNOSIS — Z9889 Other specified postprocedural states: Secondary | ICD-10-CM | POA: Diagnosis present

## 2015-09-04 DIAGNOSIS — J810 Acute pulmonary edema: Secondary | ICD-10-CM

## 2015-09-04 DIAGNOSIS — I9589 Other hypotension: Secondary | ICD-10-CM

## 2015-09-04 DIAGNOSIS — I428 Other cardiomyopathies: Secondary | ICD-10-CM | POA: Diagnosis present

## 2015-09-04 DIAGNOSIS — I429 Cardiomyopathy, unspecified: Secondary | ICD-10-CM

## 2015-09-04 DIAGNOSIS — J9601 Acute respiratory failure with hypoxia: Secondary | ICD-10-CM | POA: Diagnosis present

## 2015-09-04 DIAGNOSIS — L8990 Pressure ulcer of unspecified site, unspecified stage: Secondary | ICD-10-CM | POA: Diagnosis present

## 2015-09-04 LAB — LACTATE DEHYDROGENASE: LDH: 378 U/L — AB (ref 98–192)

## 2015-09-04 LAB — COMPREHENSIVE METABOLIC PANEL
ALBUMIN: 3.1 g/dL — AB (ref 3.5–5.0)
ALK PHOS: 109 U/L (ref 38–126)
ALT: 19 U/L (ref 14–54)
AST: 29 U/L (ref 15–41)
Anion gap: 9 (ref 5–15)
BUN: <5 mg/dL — ABNORMAL LOW (ref 6–20)
CALCIUM: 8.9 mg/dL (ref 8.9–10.3)
CHLORIDE: 96 mmol/L — AB (ref 101–111)
CO2: 31 mmol/L (ref 22–32)
CREATININE: 0.54 mg/dL (ref 0.44–1.00)
GFR calc non Af Amer: >60 mL/min (ref >60)
GLUCOSE: 136 mg/dL — AB (ref 65–99)
Potassium: 3.7 mmol/L (ref 3.5–5.1)
SODIUM: 136 mmol/L (ref 135–145)
Total Bilirubin: 1.3 mg/dL — ABNORMAL HIGH (ref 0.3–1.2)
Total Protein: 6.1 g/dL — ABNORMAL LOW (ref 6.5–8.1)

## 2015-09-04 LAB — PROTEIN, TOTAL: Total Protein: 6 g/dL — ABNORMAL LOW (ref 6.5–8.1)

## 2015-09-04 LAB — CBC
HEMATOCRIT: 36.2 % (ref 36.0–46.0)
HEMOGLOBIN: 11.4 g/dL — AB (ref 12.0–15.0)
MCH: 30.6 pg (ref 26.0–34.0)
MCHC: 31.5 g/dL (ref 30.0–36.0)
MCV: 97.3 fL (ref 78.0–100.0)
PLATELETS: 314 10*3/uL (ref 150–400)
RBC: 3.72 MIL/uL — ABNORMAL LOW (ref 3.87–5.11)
RDW: 15.8 % — ABNORMAL HIGH (ref 11.5–15.5)
WBC: 10.3 10*3/uL (ref 4.0–10.5)

## 2015-09-04 LAB — HEPARIN LEVEL (UNFRACTIONATED)
Heparin Unfractionated: 0.38 IU/mL (ref 0.30–0.70)
Heparin Unfractionated: 0.41 IU/mL (ref 0.30–0.70)

## 2015-09-04 LAB — PROTIME-INR
INR: 1.35 (ref 0.00–1.49)
Prothrombin Time: 16.8 seconds — ABNORMAL HIGH (ref 11.6–15.2)

## 2015-09-04 LAB — CHOLESTEROL, TOTAL: CHOLESTEROL: 181 mg/dL (ref 0–200)

## 2015-09-04 MED ORDER — FUROSEMIDE 20 MG PO TABS: 20.0000 mg | ORAL_TABLET | ORAL | Status: DC | PRN

## 2015-09-04 MED ORDER — ONDANSETRON HCL 4 MG PO TABS: 4.0000 mg | ORAL_TABLET | Freq: Three times a day (TID) | ORAL | Status: DC | PRN

## 2015-09-04 MED ORDER — WARFARIN SODIUM 3 MG PO TABS
3.0000 mg | ORAL_TABLET | Freq: Once | ORAL | Status: AC
  Administered 2015-09-04: 3 mg via ORAL
  Filled 2015-09-04: qty 1

## 2015-09-04 MED ORDER — FUROSEMIDE 40 MG PO TABS: 20.0000 mg | ORAL_TABLET | Freq: Every day | ORAL | Status: DC | PRN

## 2015-09-04 MED ORDER — ENOXAPARIN SODIUM 100 MG/ML ~~LOC~~ SOLN: 50.0000 mg | SUBCUTANEOUS | Status: DC

## 2015-09-04 MED ORDER — ENOXAPARIN SODIUM 60 MG/0.6ML ~~LOC~~ SOLN
50.0000 mg | SUBCUTANEOUS | Status: DC
  Administered 2015-09-04: 50 mg via SUBCUTANEOUS
  Filled 2015-09-04: qty 0.6

## 2015-09-04 MED ORDER — WARFARIN SODIUM 2 MG PO TABS
2.0000 mg | ORAL_TABLET | Freq: Once | ORAL | Status: DC
  Filled 2015-09-04: qty 1

## 2015-09-04 NOTE — Progress Notes (Addendum)
ANTICOAGULATION CONSULT NOTE - Follow Up Consult  Pharmacy Consult for heparin / coumadin Indication: Afib  Allergies  Allergen Reactions  . Vicodin [Hydrocodone-Acetaminophen] Other (See Comments)    "she feels out of it", hallucinating  . Adhesive [Tape] Itching and Rash  . Silicone Itching and Rash    TAPE ALLERGY/EKG STICKER ALLERGY, Please use "paper" tape only    Patient Measurements: Height: 5\' 2"  (157.5 cm) Weight: 105 lb 12.8 oz (47.991 kg) IBW/kg (Calculated) : 50.1  Vital Signs: Temp: 97.9 F (36.6 C) (02/16 0800) Temp Source: Oral (02/16 0800) BP: 122/74 mmHg (02/16 0800) Pulse Rate: 95 (02/16 0600)  Labs:  Recent Labs  09/02/15 0415 09/02/15 1300 09/03/15 0500 09/03/15 1559 09/03/15 2133 09/04/15 0325 09/04/15 1023  HGB 10.6*  --   --   --   --  11.4*  --   HCT 33.9*  --   --   --   --  36.2  --   PLT 300  --   --   --   --  314  --   LABPROT  --  29.4* 17.2*  --   --  16.8*  --   INR  --  2.84* 1.40  --   --  1.35  --   HEPARINUNFRC  --   --   --   --  0.23* 0.38 0.41  CREATININE 0.54  --  0.54 0.49  --  0.54  --     Estimated Creatinine Clearance: 59.5 mL/min (by C-G formula based on Cr of 0.54).   Assessment: 55 yo female with recent MV repair in January. Coumadin 2mg  daily PTA. On heparin gtt for bridging for throacentesis. Given Vit K yesterday. INR was therapeutic on admit at 2.84. Heparin and coumadin restarted after thoracentesis. INR down to 1.35 this am after one dose. HL remains therapeutic at 0.41. Hgb low at 11.4, plts wnl.  Goal of Therapy:  INR 2-3 Heparin level 0.3-0.7 units/ml Monitor platelets by anticoagulation protocol: Yes   Plan:  Continue heparin gtt at 850 units/hr Give coumadin 2mg  PO x 1 tonight Monitor daily HL / INR, CBC, s/s of bleed  ADDENDUM:  To be discharged home today  Plan: Give coumadin 3mg  PO x 1 here and then resume PTA 2mg  daily Stop heparin gtt and give enoxaparin 50mg  [place] 1 hr after heparin  stopped

## 2015-09-04 NOTE — Progress Notes (Signed)
ANTICOAGULATION CONSULT NOTE - Follow Up Consult  Pharmacy Consult for Heparin (while INR <2) Indication: atrial fibrillation  Allergies  Allergen Reactions  . Vicodin [Hydrocodone-Acetaminophen] Other (See Comments)    "she feels out of it", hallucinating  . Adhesive [Tape] Itching and Rash  . Silicone Itching and Rash    TAPE ALLERGY/EKG STICKER ALLERGY, Please use "paper" tape only    Patient Measurements: Height: 5\' 2"  (157.5 cm) Weight: 105 lb 12.8 oz (47.991 kg) IBW/kg (Calculated) : 50.1  Vital Signs: Temp: 97.8 F (36.6 C) (02/16 0012) Temp Source: Oral (02/16 0012) BP: 109/77 mmHg (02/16 0200) Pulse Rate: 99 (02/15 2000)  Labs:  Recent Labs  09/02/15 0415 09/02/15 1300 09/03/15 0500 09/03/15 1559 09/03/15 2133 09/04/15 0325  HGB 10.6*  --   --   --   --  11.4*  HCT 33.9*  --   --   --   --  36.2  PLT 300  --   --   --   --  314  LABPROT  --  29.4* 17.2*  --   --  16.8*  INR  --  2.84* 1.40  --   --  1.35  HEPARINUNFRC  --   --   --   --  0.23* 0.38  CREATININE 0.54  --  0.54 0.49  --   --     Estimated Creatinine Clearance: 59.5 mL/min (by C-G formula based on Cr of 0.49).   Assessment: Heparin/warfarin re-started s/p thoracentesis, heparin while INR <2, heparin level is therapeutic x 1 after rate increase  Goal of Therapy:  Heparin level 0.3-0.7 units/ml Monitor platelets by anticoagulation protocol: Yes   Plan:  Cont heparin 850 units/hr 1000 HL  Abran Duke 09/04/2015,4:04 AM

## 2015-09-04 NOTE — Progress Notes (Signed)
PATIENT ID: [id number] with recurrent pleural effusion and recent mitral valve repair complicated by post-operative VT/PEA arrest s/p ICD placement and severe orthostatic hypotension here with volume overload in the setting of florinef and midodrine.    INTERVAL HISTORY: Thoracentesis with removal of 1.4L serosanginous fluid  SUBJECTIVE:   Continues to feel well.  Breathing is back to baseline and edema is gone.   PHYSICAL EXAM Filed Vitals:   09/04/15 0345 09/04/15 0400 09/04/15 0600 09/04/15 0800  BP:  122/75 102/73 122/74  Pulse:   95   Temp:  97.4 F (36.3 C)  97.9 F (36.6 C)  TempSrc:  Axillary  Oral  Resp:  20 15 20   Height:      Weight: 47.991 kg (105 lb 12.8 oz)     SpO2:  95% 97% 98%   General:  Well-appearing.  NAD. Neck:  No JVD. Lungs:  CTAB.  No crackles, rhonchi or wheezes. Heart:  RRR.  II/VI systolic murmur Abdomen:  Soft, NT, ND.  +BS Extremities:  No edmea  LABS: Lab Results  Component Value Date   TROPONINI 0.65* 08/06/2015   Results for orders placed or performed during the hospital encounter of 09/02/15 (from the past 24 hour(s))  Body fluid culture     Status: None (Preliminary result)   Collection Time: 09/03/15  3:01 PM  Result Value Ref Range   Specimen Description THORACENTESIS    Special Requests NONE    Gram Stain      FEW WBC PRESENT, PREDOMINANTLY MONONUCLEAR NO ORGANISMS SEEN    Culture NO GROWTH < 24 HOURS    Report Status PENDING   Body fluid cell count with differential     Status: Abnormal   Collection Time: 09/03/15  3:29 PM  Result Value Ref Range   Fluid Type-FCT FLUID    Color, Fluid AMBER (A) YELLOW   Appearance, Fluid HAZY (A) CLEAR   WBC, Fluid 491 0 - 1000 cu mm   Neutrophil Count, Fluid 13 0 - 25 %   Lymphs, Fluid 65 %   Monocyte-Macrophage-Serous Fluid 22 (L) 50 - 90 %   Eos, Fluid 0 %  Basic metabolic panel     Status: Abnormal   Collection Time: 09/03/15  3:59 PM  Result Value Ref Range   Sodium 137 135 - 145  mmol/L   Potassium 4.0 3.5 - 5.1 mmol/L   Chloride 95 (L) 101 - 111 mmol/L   CO2 31 22 - 32 mmol/L   Glucose, Bld 98 65 - 99 mg/dL   BUN <5 (L) 6 - 20 mg/dL   Creatinine, Ser 4.09 0.44 - 1.00 mg/dL   Calcium 8.9 8.9 - 81.1 mg/dL   GFR calc non Af Amer >60 >60 mL/min   GFR calc Af Amer >60 >60 mL/min   Anion gap 11 5 - 15  Magnesium     Status: None   Collection Time: 09/03/15  3:59 PM  Result Value Ref Range   Magnesium 1.9 1.7 - 2.4 mg/dL  Heparin level (unfractionated)     Status: Abnormal   Collection Time: 09/03/15  9:33 PM  Result Value Ref Range   Heparin Unfractionated 0.23 (L) 0.30 - 0.70 IU/mL  Lactate dehydrogenase     Status: Abnormal   Collection Time: 09/04/15 12:00 AM  Result Value Ref Range   LDH 378 (H) 98 - 192 U/L  Protein, total     Status: Abnormal   Collection Time: 09/04/15 12:00 AM  Result Value Ref  Range   Total Protein 6.0 (L) 6.5 - 8.1 g/dL  Cholesterol, total     Status: None   Collection Time: 09/04/15 12:00 AM  Result Value Ref Range   Cholesterol 181 0 - 200 mg/dL  Heparin level (unfractionated)     Status: None   Collection Time: 09/04/15  3:25 AM  Result Value Ref Range   Heparin Unfractionated 0.38 0.30 - 0.70 IU/mL  CBC     Status: Abnormal   Collection Time: 09/04/15  3:25 AM  Result Value Ref Range   WBC 10.3 4.0 - 10.5 K/uL   RBC 3.72 (L) 3.87 - 5.11 MIL/uL   Hemoglobin 11.4 (L) 12.0 - 15.0 g/dL   HCT 53.2 99.2 - 42.6 %   MCV 97.3 78.0 - 100.0 fL   MCH 30.6 26.0 - 34.0 pg   MCHC 31.5 30.0 - 36.0 g/dL   RDW 83.4 (H) 19.6 - 22.2 %   Platelets 314 150 - 400 K/uL  Comprehensive metabolic panel     Status: Abnormal   Collection Time: 09/04/15  3:25 AM  Result Value Ref Range   Sodium 136 135 - 145 mmol/L   Potassium 3.7 3.5 - 5.1 mmol/L   Chloride 96 (L) 101 - 111 mmol/L   CO2 31 22 - 32 mmol/L   Glucose, Bld 136 (H) 65 - 99 mg/dL   BUN <5 (L) 6 - 20 mg/dL   Creatinine, Ser 9.79 0.44 - 1.00 mg/dL   Calcium 8.9 8.9 - 89.2 mg/dL     Total Protein 6.1 (L) 6.5 - 8.1 g/dL   Albumin 3.1 (L) 3.5 - 5.0 g/dL   AST 29 15 - 41 U/L   ALT 19 14 - 54 U/L   Alkaline Phosphatase 109 38 - 126 U/L   Total Bilirubin 1.3 (H) 0.3 - 1.2 mg/dL   GFR calc non Af Amer >60 >60 mL/min   GFR calc Af Amer >60 >60 mL/min   Anion gap 9 5 - 15  Protime-INR     Status: Abnormal   Collection Time: 09/04/15  3:25 AM  Result Value Ref Range   Prothrombin Time 16.8 (H) 11.6 - 15.2 seconds   INR 1.35 0.00 - 1.49    Intake/Output Summary (Last 24 hours) at 09/04/15 1194 Last data filed at 09/04/15 0400  Gross per 24 hour  Intake 607.38 ml  Output   1170 ml  Net -562.62 ml    Telemetry: No events  ASSESSMENT AND PLAN:  Principal Problem:   Acute respiratory failure (HCC) Active Problems:   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 Roughly 45% with hypokinesis of the anterior inferior septum. This is consistent with was seen on cardiac MRI. She remains euvolemic after diuresis and tolerated thoracenteses yesterday without complication. I suspect that much of her volume overload was attributed to Florinef.  I suggest that she weigh herself daily be discharged with lasix 20mg  po to be taken daily if her weight increases by >2lb in one day or 5lb in one week.  Discharge weight is 105 pounds.  # S/p MVR: Valve function stable  on echo.  # Orthostatic hypotension: Improved and she is stable on midodrine.  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 Mcmahen MRI showed diffuse delayed enhancement in the LV.  This is concerning for infiltrative cardiomyopathy.  Her EKG  and echo are not consistent with amyloidosis.  Will arrange for outpatient endomyocardial biopsy at [facility].  # Paroxysmal atrial fibrillation:  She maintains sinus rhythm.  She received vitamin K prior to thoracentesis. INR is 1.3 today. Warfarin was restarted last night. She will need an INR checked on Monday.  This patients CHA2DS2-VASc Score and unadjusted Ischemic Stroke Rate (% per year) is equal to 2.2 % stroke rate/year from a score of 2.  Bridging with lovenox is not necessary.  Above score calculated as 1 point each if present [CHF, HTN, DM, Vascular=MI/PAD/Aortic Plaque, Age if 65-74, or Female] Above score calculated as 2 points each if present [Age > 75, or Stroke/TIA/TE]    Time spent: 25 minutes-Greater than 50% of this time was spent in counseling, explanation of diagnosis, planning of further management, and coordination of care.    Lanetra Hartley C. Duke Salvia, MD, [facility] 09/04/2015 9:37 AM

## 2015-09-04 NOTE — Progress Notes (Signed)
  Echocardiogram 2D Echocardiogram has been performed.  Delcie Roch 09/04/2015, 9:39 AM

## 2015-09-04 NOTE — Progress Notes (Signed)
Name: Diane [name] MRN: [id number] DOB: 11-07-1960    ADMISSION DATE:  09/02/2015 CONSULTATION DATE:  09/02/15  REFERRING MD :  EDP  CHIEF COMPLAINT:  SOB   HISTORY OF PRESENT ILLNESS:  Diane [name] is a 55 y.o. female with a PMH as outlined below including recent MV repair 07/22/15 and prolonged hospitalization (08/05/15 through 08/29/15) for VT / PEA arrest s/p ICD placement 08/15/15. During that admission she had large left pleural effusion which was drained via thoracentesis 08/11/15 (1L exudative with negative cytology).  Also had repeat left thora 08/28/15 which yielded 1.3L (also exudative with negative cytology). She had been doing fine when she first got home, but the following day began to experience SOB and increasing fatigue.  SOB worse while lying supine and with exertion, mildly relieved with rest.  She also had non-productive dry hacking cough and decreased appetite.  Cough became so frequent that it began to make her nauseous.  She also had LE edema that had gradually been worsening.    Symptoms progressed over the weekend and on morning of 02/14, she was much more uncomfortable and felt she needed medical attention.  She came to [facility] ED where CXR revealed recurrent bilateral pleural effusions, L > R.  BNP elevated at 1154, INR 3.22.  Additional labs unremarkable.  Weight at discharge 108lbs and on ED presentation today, weight up to 122lbs.  PCCM was called for further recommendations on pleural effusion management.  SUBJECTIVE:  Feels much better, off BiPAP, on 2L [place].  VITAL SIGNS: Temp:  [97.4 F (36.3 C)-98.6 F (37 C)] 97.8 F (36.6 C) (02/16 1200) Pulse Rate:  [94-99] 94 (02/16 1200) Resp:  [15-20] 19 (02/16 1200) BP: (102-124)/(70-77) 120/71 mmHg (02/16 1200) SpO2:  [95 %-99 %] 99 % (02/16 1200) Weight:  [47.991 kg (105 lb 12.8 oz)] 47.991 kg (105 lb 12.8 oz) (02/16 0345)  PHYSICAL EXAMINATION: General: Middle aged female, resting in bed, in NAD. Neuro: A&O x  3, non-focal.  HEENT: [place]/AT. PERRL, sclerae anicteric. Cardiovascular: RRR, no M/R/G.  Lungs: Respirations shallow and unlabored.  Diminished BS left side with dullness to percussion and + egophany.  Faint crackles in right base. Abdomen: BS x 4, soft, NT/ND.  Musculoskeletal: No gross deformities, 2+ edema BLE's.  Skin: Intact, warm, no rashes.  Recent Labs Lab 09/03/15 0500 09/03/15 1559 09/04/15 0325  NA 138 137 136  K 2.6* 4.0 3.7  CL 91* 95* 96*  CO2 34* 31 31  BUN <5* <5* <5*  CREATININE 0.54 0.49 0.54  GLUCOSE 97 98 136*    Recent Labs Lab 09/02/15 0415 09/04/15 0325  HGB 10.6* 11.4*  HCT 33.9* 36.2  WBC 7.1 10.3  PLT 300 314   Dg Chest Port 1 View  09/03/2015  CLINICAL DATA:  Follow-up left thoracentesis. EXAM: PORTABLE CHEST 1 VIEW COMPARISON:  Earlier same day. FINDINGS: Virtually all the pleural fluid is been removed from the left hemi thorax. The left lung is well aerated. Small to moderate effusion persists on the right with mild right base volume loss. No other change. No pneumothorax. IMPRESSION: Left thoracentesis with essentially complete evacuation of the left effusion. Left lung well aerated. No pneumothorax. No change small to moderate right effusion. Electronically Signed   By: Paulina Fusi M.D.   On: 09/03/2015 15:14   Dg Chest Port 1 View  09/03/2015  CLINICAL DATA:  Pleural effusion EXAM: PORTABLE CHEST 1 VIEW COMPARISON:  September 02, 2015 FINDINGS: There is a sizable pleural effusion on  the left with left mid and lower lung zone airspace consolidation. There is a much smaller right effusion with right base atelectasis. There is cardiac enlargement with pulmonary vascularity within normal limits, stable. Pacemaker leads are attached to the right atrium and right ventricle. Patient is status post mitral valve replacement. A left atrial appendage clamp is present. No adenopathy is apparent. No apparent pneumothorax. IMPRESSION: Bilateral pleural effusions,  considerably larger on the left than on the right, stable. Left mid lower lung zone airspace consolidation. Patchy atelectasis right base. No change in cardiac silhouette. No pneumothorax. Electronically Signed   By: Bretta Bang III M.D.   On: 09/03/2015 07:26   STUDIES:  CXR 02/14 > bilateral effusions L > R with basilar atx.  SIGNIFICANT EVENTS  02/14 > presented to ED with SOB, increased LE edema, weight gain.  PCCM consulted for recs on bilateral effusions, L > R (had L thora 01/23 with 1L drained).  ASSESSMENT / PLAN:  Recurrent bilateral effusions, L > R - had same issue last hospitalization with left sided thoracentesis that yielded 1L exudative semi-hemorrhagic effusion (was on heparin).  Also had repeat left thora 08/28/15 which yielded 1.3L (also exudative with negative cytology).  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 Plan: Diuresis as BP and SCr permit. D/C BiPAP. Repeat INR in AM. Significant amount of fluid on CXR and U/S. Continue coumadin. Follow CXR. May want to consider a pleurex catheter or pleurodesis, recommend CVTS consultation.  AoC combined heart failure - echo from 08/12/15 with EF 55-60%, no RWMA's.  Severe MR with PDA - s/p MV repair and ligation of PDA 07/22/15. S/p PEA / VT arrest 08/05/15. Plan: Diuresis as BP and SCr permit. Cardiology following, appreciate assistance. Heparin per pharmacy once INR < 2.  Hypoxemia:  - Supplemental O2 for sat of 88-92%.  - Will need an ambulatory desaturation study prior to discharge for evaluation of home O2.  Alyson Reedy, M.D. [facility] Pulmonary/Critical Care Medicine. Pager: [phone]. After hours pager: [phone].  09/04/2015, 1:28 PM

## 2015-09-04 NOTE — Care Management Note (Signed)
Case Management Note  Patient Details  Name: Diane [name] MRN: [id number] Date of Birth: 10-29-1960  Subjective/Objective:      Pt is currently active with [facility] for RN and PT services, expects to resume those services when discharged.                        Expected Discharge Plan:  Home w Home Health Services  Discharge planning Services  CM Consult  Post Acute Care Choice:  Resumption of Svcs/PTA Provider  HH Arranged:  RN, PT [facility] Agency:  [facility]  Status of Service:  In process, will continue to follow  Magdalene River, RN 09/04/2015, 6:34 AM

## 2015-09-04 NOTE — Care Management Note (Signed)
Case Management Note  Patient Details  Name: Diane [name] MRN: [id number] Date of Birth: [birth year]  Subjective/Objective:     Per Genevieve Norlander liaison, orders at time of previous discharge were for home health RN, PT, OT, SW, and aide.  Services will begin 24-48 hrs after discharge.                                         Expected Discharge Plan:  Home w Home Health Services  In-House Referral:     Discharge planning Services  CM Consult  Post Acute Care Choice:  Resumption of Svcs/PTA Provider Choice offered to:     DME Arranged:    DME Agency:     HH Arranged:  RN, PT, OT, Nurse's Aide, Social Work [employer] Agency:  [facility]  Status of Service:  Completed, signed off  Medicare Important Message Given:    Date Medicare IM Given:    Medicare IM give by:    Date Additional Medicare IM Given:    Additional Medicare Important Message give by:     If discussed at Long Length of Stay Meetings, dates discussed:    Additional Comments:  Magdalene River, RN 09/04/2015, 1:19 PM

## 2015-09-04 NOTE — Discharge Summary (Addendum)
Physician Discharge Summary  Coralynn Gaona WJX:[id number] DOB: 1960-11-10 DOA: 09/02/2015  PCP: Darrow Bussing, MD  Admit date: 09/02/2015 Discharge date: 09/04/2015  Time spent: 45 minutes  Recommendations for Outpatient Follow-up:  Acute hypoxic respiratory failure due to pulmonary edema and pleural effusion -Resolved after Lt Thoracentesis performed 2/9 yielding 1.3L of transudative fluid -Right small effusion stable on f/u CXR 2/15 -Counseled patient at length on weighing herself daily, and following heart healthy diet. -Follow-up in one to 2 weeks with Dr. Darrow Bussing for pleural effusion/pulmonary edema  Volume overload  -EF 55-60% via TTE August 12, 2015 therefore systolic fxn recovered post arrest - weight 108 pounds at discharge 09/01/15 and 122 on date of readmission 2 days later - 107.5 pounds today - ?due to florinef - holding florinef for now  -Follow-up with PA Azalee Course for infiltrative cardiomyopathy, acute on chronic systolic CHF on 2/23 at 0930  ?Infiltrative Cardiomyopathy -Cardiology following. Appointment on 2/23.  -Per cardiology note Will arrange for outpatient Endomyocardial biopsy at [facility]  Acute on chronic Chronic Systolic CHF -Counseled patient at length that cardiology has mandated her discharge weight is 105 pounds. Patient is to weigh herself as soon as she arrives home and record in her logbook. -she weigh herself daily be discharged with lasix 20mg  po to be taken daily if her weight increases by >2lb in one day or 5lb in one week  Pulmonary hypertension -See CHF.  Paroxysmal atrial fibrillation -CHA2DS2 - VASc is 3 -Counseled patient that she will need to have her INR checked on Monday -Patient has been restarted on Coumadin 2 mg daily. -Prior to discharge today patient will receive additional 1 mg Coumadin -Patient has additional risk of new MV repair will bridge with Lovenox 50 mg daily -Schedule patient for Coumadin clinic Monday 20  February  Status post mitral valve repair 07/22/2015 -Due to severe mitral regurg; f/u TTEs have confirmed stable valve function - TCTS has seen in f/u multiple times, currently no need for follow-up   Status post cardiac arrest January 2017 -In the setting of infiltrative cardiomyopathy and prolonged QT - status post ICD implantation January 2017; patient being followed by cardiology  Hypotension -Chronically on midodrine 10 mg TID       Discharge Diagnoses:  Principal Problem:   Acute respiratory failure (HCC) Active Problems:   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   Acute on chronic combined systolic (congestive) and diastolic (congestive) heart failure (HCC)   Atrial fibrillation (HCC)   Recurrent left pleural effusion   Hypotension   Hypoxemia   Acute pulmonary edema (HCC)   Pressure ulcer   S/P thoracentesis   Infiltrative cardiomyopathy (HCC)   Acute respiratory failure with hypoxia (HCC)   Discharge Condition: Stable  Diet recommendation: Heart healthy  Filed Weights   09/02/15 0411 09/03/15 0530 09/04/15 0345  Weight: 55.339 kg (122 lb) 48.762 kg (107 lb 8 oz) 47.991 kg (105 lb 12.8 oz)    History of present illness:  55 y.o.AF PMHx Depression Recent MV repair and prolonged hospitalization after VT/PA arrest status post ICD placement, Chronic Systolic and Diastolic CHF, Aortic Aneurysm   presents to the emergency department with the chief complaint worsening shortness of breath and the 2 days since her discharge. Initial evaluation in the emergency department reveals acute respiratory failure related to acute on chronic combined diastolic systolic heart failure and worsening pleural effusion.  Information obtained from the patient and the daughter who is at the bedside. She  states when she was discharged she still had pleural effusions that the doctor said "will monitor and see how your heart does". She reports worsening shortness  of breath lower extremity edema and weight gain starting yesterday. He does admit to not taking her Lasix as she suffered with low blood pressure. In addition she complains of chest pain epigastric nonradiating nausea no vomiting. She denies fever chills headache syncope or near-syncope. He denies dysuria hematuria frequency or urgency.  Emergency department she is afebrile no dynamically stable and requiring BiPAP During his hospitalization patient medication was adjusted and currently in NSR, in addition thoracentesis performed removing 1.3 L positive fluid, and was diuresed to her base weight of 105 pounds. Hospital Course:   Procedures: 2/9 Lt thoracentesis; removed1.3L of transudative fluid    Consultations: Dr.Tiffany Duke Salvia, [facility]    Discharge Exam: Filed Vitals:   09/04/15 0345 09/04/15 0400 09/04/15 0600 09/04/15 0800  BP:  122/75 102/73 122/74  Pulse:   95   Temp:  97.4 F (36.3 C)  97.9 F (36.6 C)  TempSrc:  Axillary  Oral  Resp:  20 15 20   Height:      Weight: 47.991 kg (105 lb 12.8 oz)     SpO2:  95% 97% 98%    General: A/O 4, NAD, negative acute respiratory distress Cardiovascular: Regular in rate, negative murmurs rubs or gallops, normal S1/S2 Respiratory: Clear to auscultation bilaterally except left basilar crackles  Discharge Instructions     Medication List    STOP taking these medications        fludrocortisone 0.1 MG tablet  Commonly known as:  FLORINEF     sodium chloride 1 g tablet     traMADol 50 MG tablet  Commonly known as:  ULTRAM      TAKE these medications        buPROPion 75 MG tablet  Commonly known as:  WELLBUTRIN  Take 0.5 tablets (37.5 mg total) by mouth 2 (two) times daily.     feeding supplement Liqd  Take 1 Container by mouth 2 (two) times daily between meals.     furosemide 20 MG tablet  Commonly known as:  LASIX  Take 1 tablet (20 mg total) by mouth as needed for fluid (be taken daily if her weight  increases by >2lb in one day or 5lb in one week).     midodrine 10 MG tablet  Commonly known as:  PROAMATINE  Take 1 tablet (10 mg total) by mouth 3 (three) times daily.     ondansetron 4 MG tablet  Commonly known as:  ZOFRAN  Take 1 tablet (4 mg total) by mouth every 8 (eight) hours as needed for nausea or vomiting.     pantoprazole 40 MG tablet  Commonly known as:  PROTONIX  Take 1 tablet (40 mg total) by mouth daily at 12 noon.     warfarin 1 MG tablet  Commonly known as:  COUMADIN  Take 2 tablets (2 mg total) by mouth daily at 6 PM. Start 2/11-HOLD dose 2/10       Allergies  Allergen Reactions  . Vicodin [Hydrocodone-Acetaminophen] Other (See Comments)    "she feels out of it", hallucinating  . Adhesive [Tape] Itching and Rash  . Silicone Itching and Rash    TAPE ALLERGY/EKG STICKER ALLERGY, Please use "paper" tape only   Follow-up Information    Follow up with Azalee Course, PA On 09/11/2015.   Specialties:  Cardiology, Radiology   Why:  9:30AM. Cardiology followup.    Contact information:   [address] [place] [address] [phone]       Follow up with Darrow Bussing, MD.   Specialty:  Family Medicine   Why:  Follow-up in one to 2 weeks with Dr. Darrow Bussing for pleural effusion/pulmonary edema   Contact information:   [address] [place] [address] [phone]       Follow up with [facility]. Schedule an appointment as soon as possible for a visit in 4 days.   Specialty:  Cardiology   Why:  Schedule patient for Coumadin clinic Monday 20 February   Contact information:   [address] [place] [address] [phone]       The results of significant diagnostics from this hospitalization (including imaging, microbiology, ancillary and laboratory) are listed below for reference.    Significant Diagnostic Studies: Dg Chest 1 View  08/28/2015  CLINICAL DATA:  Status  post left thoracentesis EXAM: CHEST 1 VIEW COMPARISON:  08/27/2015 FINDINGS: No appreciable pneumothorax. There is only a trace residual left pleural effusion, with some subsegmental atelectasis in the newly re-expanded left lower lobe. Aortic valve prosthesis. AICD noted. Small right pleural effusion. Borderline enlargement of the cardiopericardial silhouette. IMPRESSION: 1. There is only a small residual left pleural effusion, status post thoracentesis. No pneumothorax. Subsegmental atelectasis in the newly re- expanded left lower lobe. 2. Small right pleural effusion with passive atelectasis. 3. Aortic valve prosthesis. Electronically Signed   By: Gaylyn Rong M.D.   On: 08/28/2015 15:59   Dg Chest 2 View  09/02/2015  CLINICAL DATA:  Shortness of breath, chest tightness, CHF. Surgery in January. EXAM: CHEST  2 VIEW COMPARISON:  08/29/2015 FINDINGS: Postoperative changes in the mediastinum. Cardiac pacemaker. Bilateral pleural effusions with basilar atelectasis or consolidation, greater on the left. There is progression since previous study, particularly on the left. No pneumothorax. Probable cardiac enlargement although the heart shadow is obscured by the parenchymal process. IMPRESSION: Bilateral pleural effusions with basilar atelectasis or consolidation demonstrating progression since previous study. Electronically Signed   By: Burman Nieves M.D.   On: 09/02/2015 04:31   Dg Chest 2 View  08/27/2015  CLINICAL DATA:  Increasing shortness of breath over the last few days EXAM: CHEST  2 VIEW COMPARISON:  08/16/2015 FINDINGS: Postsurgical changes are noted. A defibrillator is again seen and stable. Small right-sided pleural effusion is noted. A large left-sided pleural effusion is seen occupying approximately 50% of the left hemi thorax. No focal infiltrate is seen although some underlying atelectasis is likely present in the left base. IMPRESSION: Bilateral pleural effusions left considerably greater  than right significantly increased from the prior exam. Electronically Signed   By: Alcide Clever M.D.   On: 08/27/2015 12:47   Dg Chest 2 View  08/16/2015  CLINICAL DATA:  ICD placement.  History of mitral valve replacement. EXAM: CHEST  2 VIEW COMPARISON:  08/15/2015 and prior exams FINDINGS: A left-sided dual lead ICD is now noted with leads overlying the right atrium and right ventricle. There is no evidence of pneumothorax. Cardiomegaly,, cardiac valve replacement, left atrial appendage clip again noted. A small left pleural effusion with left basilar atelectasis and trace right pleural effusion noted. IMPRESSION: Left ICD placement -no evidence of pneumothorax. Small left pleural effusion, left basilar atelectasis and trace right pleural effusion again noted. Electronically Signed   By: Harmon Pier M.D.   On:  08/16/2015 07:18   Dg Chest 2 View  08/15/2015  CLINICAL DATA:  Follow-up pleural effusion EXAM: CHEST  2 VIEW COMPARISON:  08/13/2015 FINDINGS: Right pleural effusion improved. Small left pleural effusion is stable. No pneumothorax. Postoperative changes are stable. Left jugular venous catheter removed. Hyperaeration. Minimal atelectasis at the left base. IMPRESSION: Improved right pleural effusion. Stable left pleural effusion. No pneumothorax. Electronically Signed   By: Jolaine Click M.D.   On: 08/15/2015 10:36   Ct Head Wo Contrast  08/07/2015  CLINICAL DATA:  Status post cardiac arrest. History of CHF, aortic aneurysm. EXAM: CT HEAD WITHOUT CONTRAST TECHNIQUE: Contiguous axial images were obtained from the base of the skull through the vertex without intravenous contrast. COMPARISON:  MRI of the brain January 14, 2015 FINDINGS: The ventricles and sulci are normal. No intraparenchymal hemorrhage, mass effect nor midline shift. No acute large vascular territory infarcts. No abnormal extra-axial fluid collections. Basal cisterns are patent. No skull fracture. Subcentimeter calcification LEFT  periorbital soft tissues. The included ocular globes and orbital contents are non-suspicious. The mastoid aircells and included paranasal sinuses are well-aerated. IMPRESSION: Negative CT head. Electronically Signed   By: Awilda Metro M.D.   On: 08/07/2015 23:36   Ct Angio Chest Pe W/cm &/or Wo Cm  08/05/2015  CLINICAL DATA:  55 year old female with shortness of breath. Status post CPR. EXAM: CT ANGIOGRAPHY CHEST WITH CONTRAST TECHNIQUE: Multidetector CT imaging of the chest was performed using the standard protocol during bolus administration of intravenous contrast. Multiplanar CT image reconstructions and MIPs were obtained to evaluate the vascular anatomy. CONTRAST:  25mL OMNIPAQUE IOHEXOL 350 MG/ML SOLN COMPARISON:  Radiograph dated 08/05/2015 FINDINGS: An endotracheal tube is noted with tip at the carina tilting towards the right mainstem bronchus. Recommend retraction and repositioning by approximately 3- 4 cm. Moderate bilateral pleural effusions. There is complete consolidation of the lower lobes bilaterally with air bronchograms. There is partial consolidative changes of the left upper lobe and lingula. There is ground-glass opacity within the lingula. Findings may represent pneumonia, or pulmonary edema or ARDS. Clinical correlation is recommended. There is no pneumothorax. The visualized thoracic aorta appears unremarkable. No CT evidence of pulmonary embolism. Top-normal cardiac size. Mechanical mitral valve. Small pockets of air noted within the right atrium, likely iatrogenic from IV injection. There is retrograde flow of contrast from the right atrium into the IVC compatible with a degree of right cardiac dysfunction. No significant pericardial effusion. Small amount of fluid along the left aortic arch in the upper mediastinum may represent complex fluid or small amount of blood/serosanguineous fluid. Small pockets of air noted within this fluid in the upper mediastinum compatible with small  pneumomediastinum. An enteric tube is partially visualized extending into the upper abdomen with tip not included in the CT. There is no axillary adenopathy. There is diffuse subcutaneous soft tissue edema and anasarca. Mild degenerative changes of the spine. Median sternotomy wires age indeterminate fracture of the left first rib, new from prior study possibly acute or subacute. No other acute fracture identified. Review of the MIP images confirms the above findings. IMPRESSION: No CT evidence of pulmonary embolism. Moderate bilateral pleural effusions with complete consolidative changes of the lower lobes and partial consolidative changes of the left upper lobe. Clinical correlation is recommended. Endotracheal tube the tip at the origin of the right mainstem bronchus. Recommend retraction and repositioning by approximately 3-4 cm. Small high attenuating fluid along the left aspect of the aortic arch may represent complex fluid or small  amount of blood. These results were called by telephone at the time of interpretation on 08/05/2015 at 10:59 pm to Dr. Marijean Niemann , who verbally acknowledged these results. Electronically Signed   By: Elgie Collard M.D.   On: 08/05/2015 23:04   Dg Chest Port 1 View  09/03/2015  CLINICAL DATA:  Follow-up left thoracentesis. EXAM: PORTABLE CHEST 1 VIEW COMPARISON:  Earlier same day. FINDINGS: Virtually all the pleural fluid is been removed from the left hemi thorax. The left lung is well aerated. Small to moderate effusion persists on the right with mild right base volume loss. No other change. No pneumothorax. IMPRESSION: Left thoracentesis with essentially complete evacuation of the left effusion. Left lung well aerated. No pneumothorax. No change small to moderate right effusion. Electronically Signed   By: Paulina Fusi M.D.   On: 09/03/2015 15:14   Dg Chest Port 1 View  09/03/2015  CLINICAL DATA:  Pleural effusion EXAM: PORTABLE CHEST 1 VIEW COMPARISON:  September 02, 2015 FINDINGS: There is a sizable pleural effusion on the left with left mid and lower lung zone airspace consolidation. There is a much smaller right effusion with right base atelectasis. There is cardiac enlargement with pulmonary vascularity within normal limits, stable. Pacemaker leads are attached to the right atrium and right ventricle. Patient is status post mitral valve replacement. A left atrial appendage clamp is present. No adenopathy is apparent. No apparent pneumothorax. IMPRESSION: Bilateral pleural effusions, considerably larger on the left than on the right, stable. Left mid lower lung zone airspace consolidation. Patchy atelectasis right base. No change in cardiac silhouette. No pneumothorax. Electronically Signed   By: Bretta Bang III M.D.   On: 09/03/2015 07:26   Dg Chest Port 1 View  08/29/2015  CLINICAL DATA:  Cough. EXAM: PORTABLE CHEST 1 VIEW COMPARISON:  08/28/2015. FINDINGS: Cardiac pacer noted lead tips in right atrium right ventricle. Prior cardiac valve replacement. Prior CABG. Left atrial appendage clip. Cardiomegaly. Mild left mid and lower lung field infiltrate and bilateral interstitial prominence. Small pleural effusions are noted increased from prior exam. Findings suggest congestive heart failure. Pneumonia particularly in the left mid and lower lung cannot be excluded. No pneumothorax . IMPRESSION: 1. Cardiac pacer in stable position. Prior CABG and cardiac valve replacement. Cardiomegaly . 2. Increasing interstitial prominence with infiltrate in the left mid and lower lung. Progressive bilateral pleural effusions. Findings consistent with congestive heart failure. Left mid and lower lung pneumonia cannot be excluded. Electronically Signed   By: Maisie Fus  Register   On: 08/29/2015 07:34   Dg Chest Port 1 View  08/13/2015  CLINICAL DATA:  Shortness of breath.  Pleural effusion. EXAM: PORTABLE CHEST 1 VIEW COMPARISON:  08/12/2015. FINDINGS: Interim extubation removal of NG  tube. Left IJ line in stable position. Prior CABG and cardiac valve replacement. Left atrial appendage clip noted. Stable mild cardiomegaly. No pulmonary venous congestion. Low lung volumes with bibasilar atelectasis and/or bilateral infiltrates/edema. Small bilateral pleural effusions. IMPRESSION: 1. Interim extubation removal of NG tube. Left IJ line in stable position. 2. Prior CABG and cardiac valve replacement. Left atrial appendage clip noted. Stable cardiomegaly. No pulmonary venous congestion. 3. Low lung volumes with persistent bibasilar atelectasis and/or pulmonary infiltrates/edema. Persistent bilateral pleural effusions. No interim change. Electronically Signed   By: Maisie Fus  Register   On: 08/13/2015 07:06   Dg Chest Port 1 View  08/12/2015  CLINICAL DATA:  Endotracheal tube.  Shortness of breath. EXAM: PORTABLE CHEST 1 VIEW COMPARISON:  08/11/2015 FINDINGS:  Endotracheal tube with tip measuring 4.5 cm above the carina. Enteric tube tip is off the field of view but below the left hemidiaphragm. Left central venous catheter with tip over the low SVC region. Normal heart size and pulmonary vascularity. Shallow inspiration. Small bilateral pleural effusions with basilar atelectasis or infiltration. No definite change since previous study, allowing for differences in technique. Postoperative changes in the mediastinum. IMPRESSION: Appliances appear to be in satisfactory location. Small bilateral pleural effusions with basilar atelectasis or infiltration. Electronically Signed   By: Burman Nieves M.D.   On: 08/12/2015 03:39   Dg Chest Port 1 View  08/11/2015  CLINICAL DATA:  Status post left-sided thoracentesis EXAM: PORTABLE CHEST 1 VIEW COMPARISON:  Study obtained earlier in the day FINDINGS: Left effusion is smaller following thoracentesis. No pneumothorax. Endotracheal tube tip is 3.6 cm above the carina. Central catheter tip is in the superior vena cava. Nasogastric tube tip and side port are  below the diaphragm in the stomach. There remains a fairly small left effusion. There is also a small right effusion. There is moderate atelectatic change in the left base and medial right base regions. Heart is mildly enlarged. Patient has a left atrial appendage clamp. The patient is status post mitral valve replacement. IMPRESSION: Left effusion smaller following thoracentesis. No pneumothorax. There is a small left effusion with small right effusion is well. Bibasilar atelectatic change, more on the left than on the right. Stable cardiac silhouette. Tube and catheter positions as described without pneumothorax. Electronically Signed   By: Bretta Bang III M.D.   On: 08/11/2015 13:02   Dg Chest Port 1 View  08/11/2015  CLINICAL DATA:  Ventilator.  Mitral valve repair. EXAM: PORTABLE CHEST 1 VIEW COMPARISON:  08/10/2015 FINDINGS: Changes of median sternotomy and valve replacement. Bilateral pleural effusions, left greater than right, likely slightly improved since prior study. Bilateral airspace opacities, most pronounced in the the lower lobes, also greater on the left with slight improvement in aeration since prior study. No pneumothorax. No acute bony abnormality. IMPRESSION: Moderate to large bilateral pleural effusions and bilateral airspace disease, both greater on the left. This is improved slightly since prior study. Support devices stable. Electronically Signed   By: Charlett Nose M.D.   On: 08/11/2015 07:12   Dg Chest Port 1 View  08/10/2015  CLINICAL DATA:  Endotracheally intubated. Pt currently on lasix and diuresed about 4 liters of fluid from chest. Checking on fluid to be able to extubate tomorrow. EXAM: PORTABLE CHEST 1 VIEW COMPARISON:  08/10/2015 FINDINGS: Lines and tubes unchanged. Large left pleural effusion unchanged. Moderate right pleural effusion unchanged. Vascular congestion stable. Bilateral airspace opacity presumably represent the compressive atelectasis as the result of the  pleural fluid stable. IMPRESSION: Radiographically no change from prior study Electronically Signed   By: Esperanza Heir M.D.   On: 08/10/2015 14:12   Dg Chest Port 1 View  08/10/2015  CLINICAL DATA:  Pleural effusion. EXAM: PORTABLE CHEST 1 VIEW COMPARISON:  08/09/2015 FINDINGS: Endotracheal tube, enteric catheter, left IJ approach central venous catheter are stable. Median sternotomy wires also seen. Cardiomediastinal silhouette is partially obscured by dense lower lobe airspace opacities and bilateral pleural effusions. Reticular opacities are seen in the upper lobes. There is no evidence of focal airspace consolidation, pleural effusion or pneumothorax. Osseous structures are without acute abnormality. Soft tissues are grossly normal. IMPRESSION: Pulmonary edema with bilateral pleural effusions. More confluent airspace consolidation in bilateral lung bases may represent areas of subsegmental atelectasis. Electronically  Signed   By: Ted Mcalpine M.D.   On: 08/10/2015 08:47   Dg Chest Port 1 View  08/09/2015  CLINICAL DATA:  Check endotracheal tube placement EXAM: PORTABLE CHEST - 1 VIEW COMPARISON:  08/08/2015 FINDINGS: An endotracheal tube is again seen approximately 6 cm above the carina. A left jugular central line is noted at the cavoatrial junction. Nasogastric catheter extends into the stomach. Postsurgical changes are seen. Left-sided pleural effusion is again noted. Patchy infiltrates are again seen likely related to edema but stable. No new focal abnormality is noted. IMPRESSION: Tubes and lines as described. Left-sided pleural effusion and bilateral pulmonary edema stable in appearance. Electronically Signed   By: Alcide Clever M.D.   On: 08/09/2015 08:05   Dg Chest Port 1 View  08/08/2015  CLINICAL DATA:  Cardiac arrest EXAM: PORTABLE CHEST 1 VIEW COMPARISON:  08/07/2015 FINDINGS: Endotracheal tube in good position. Central venous catheter tip in the SVC unchanged. NG tube in the  stomach. Moderately large left effusion with left lower lobe airspace disease unchanged. Smaller right effusion with right lower lobe airspace disease unchanged. Diffuse bilateral airspace disease may represent edema and has progressed. IMPRESSION: Progressive bilateral airspace disease most likely due to edema. No change in bilateral pleural effusions left greater than right with compressive atelectasis in the bases. Endotracheal tube remains in good position. Electronically Signed   By: Marlan Palau M.D.   On: 08/08/2015 08:06   Dg Chest Port 1 View  08/07/2015  CLINICAL DATA:  Heart failure. EXAM: PORTABLE CHEST 1 VIEW COMPARISON:  08/05/2015. FINDINGS: Endotracheal tube, NG tube, left IJ line in stable position. Prior cardiac valve replacement. Left atrial appendage clip noted. Partial clearing of bilateral pulmonary infiltrates/edema. Persistent but slightly improved bilateral pleural effusions. No pneumothorax . IMPRESSION: 1. Lines and tubes in stable position. 2. Partial clearing of bilateral pulmonary edema and pleural effusions. 3. Prior cardiac valve replacement.  Stable cardiomegaly. Electronically Signed   By: Maisie Fus  Register   On: 08/07/2015 07:32   Dg Chest Port 1 View  08/05/2015  CLINICAL DATA:  Central line placement.  Initial encounter. EXAM: PORTABLE CHEST 1 VIEW COMPARISON:  Chest radiograph performed earlier today at 7:10 p.m., and CTA of the chest performed earlier today at 10:15 p.m. FINDINGS: The left IJ line is noted ending about the distal SVC. The patient's endotracheal tube is noted ending 2-3 cm above the carina. An enteric tube is noted extending below the diaphragm. Vascular congestion is again noted. Note is made of small right and small to moderate left pleural effusions, and likely underlying pulmonary edema. No pneumothorax is seen. The cardiomediastinal silhouette is mildly enlarged. The patient is status post median sternotomy. A mitral valve replacement is noted. No  acute osseous abnormalities are identified. IMPRESSION: 1. Left IJ line noted ending about the distal SVC. 2. Endotracheal tube noted ending 2-3 cm above the carina. 3. Vascular congestion and mild cardiomegaly. Small right and small to moderate left pleural effusions, with likely underlying pulmonary edema. Electronically Signed   By: Roanna Raider M.D.   On: 08/05/2015 23:50   Dg Chest Portable 1 View  08/05/2015  CLINICAL DATA:  Status post CPR. Endotracheal tube placement. Initial encounter. EXAM: PORTABLE CHEST 1 VIEW COMPARISON:  Chest radiograph performed 07/31/2015 FINDINGS: The patient's endotracheal tube is noted ending overlying the right mainstem bronchus, 1 cm below the carina. This should be retracted 4 cm. Diffuse bilateral airspace opacification is noted, with a small to moderate left-sided pleural effusion.  This is concerning for pulmonary edema, though pneumonia cannot be excluded. No pneumothorax is seen. The cardiomediastinal silhouette is mildly enlarged. The patient is status post median sternotomy. External pacing pads are noted. No acute osseous abnormalities are seen. IMPRESSION: 1. Endotracheal tube noted ending overlying the right mainstem bronchus, 1 cm below the carina. This should be retracted 4 cm. 2. Diffuse bilateral airspace opacification, with a small to moderate left-sided pleural effusion. This is concerning for pulmonary edema, though pneumonia cannot be excluded. 3. Mild cardiomegaly. These results were called by telephone at the time of interpretation on 08/05/2015 at 9:20 pm to Nursing at the [facility] ER, who verbally acknowledged these results. The endotracheal tube had already been repositioned. Electronically Signed   By: Roanna Raider M.D.   On: 08/05/2015 21:50   Dg Abd Portable 1v  08/10/2015  CLINICAL DATA:  Vomiting tube feedings, orogastric tube placement EXAM: PORTABLE ABDOMEN - 1 VIEW COMPARISON:  None. FINDINGS: Orogastric tube projects just to the right  of midline over the anticipated position of the distal body of the stomach. Nonobstructive gas pattern. Rectal thermometer noted. IMPRESSION: OG tube as described Electronically Signed   By: Esperanza Heir M.D.   On: 08/10/2015 17:43   Dg Swallowing Func-speech Pathology  08/29/2015  Objective Swallowing Evaluation: Type of Study: MBS-Modified Barium Swallow Study Patient Details Name: Sadae Arrazola MRN: [id number] Date of Birth: [birth year] Today's Date: 08/29/2015 Time: SLP Start Time (ACUTE ONLY): 1300-SLP Stop Time (ACUTE ONLY): 1320 SLP Time Calculation (min) (ACUTE ONLY): 20 min Past Medical History: Past Medical History Diagnosis Date . Severe mitral regurgitation 07/03/2015 . Chronic combined systolic and diastolic CHF (congestive heart failure) (HCC) 07/03/2015   Grade 3 diastolic dysfunction.  LVEF 40-45% 05/2015. Marland Kitchen Aortic aneurysm (HCC)  . Patent ductus arteriosus 07/16/2015 . Depression    SOME DEPRESSION (ON MEDS FOR 2 YRS) WHEN HER MOTHER PASSED . 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 Ovarian cyst removal   . Tee without cardioversion N/A 07/07/2015   Procedure: TRANSESOPHAGEAL ECHOCARDIOGRAM (TEE);  Surgeon: Chilton Si, MD;  Location: [facility] ENDOSCOPY;  Service: Cardiovascular;  Laterality: N/A; . Cardiac catheterization N/A 07/08/2015   Procedure: Right/Left Heart Cath and Coronary Angiography;  Surgeon: Marykay Lex, MD;  Location: [facility] INVASIVE CV LAB;  Service: Cardiovascular;  Laterality: N/A; . Mitral valve repair N/A 07/22/2015   Procedure: MITRAL VALVE REPAIR;  Surgeon: Purcell Nails, MD;  Location: [facility] OR;  Service: Open Heart Surgery;  Laterality: N/A;  26 Sorin Memo 3D . Tee without cardioversion N/A 07/22/2015   Procedure: TRANSESOPHAGEAL ECHOCARDIOGRAM (TEE);  Surgeon:  Purcell Nails, MD;  Location: [facility] OR;  Service: Open Heart Surgery;  Laterality: N/A; . Patent ductus arterious repair N/A 07/22/2015   Procedure: PATENT DUCTUS ARTERIOSUS (PDA) ligation;  Surgeon: Purcell Nails, MD;  Location: [facility] OR;  Service: Open Heart Surgery;  Laterality: N/A; . Clipping of atrial appendage  07/22/2015   Procedure: CLIPPING OF LEFT ATRIAL APPENDAGE;  Surgeon: Purcell Nails, MD;  Location: [facility] OR;  Service: Open Heart Surgery;;  35 Atriclip Pro . Ep implantable device N/A 08/15/2015   Procedure: ICD Implant;  Surgeon: Will Jorja Loa, MD;  Location: [facility] INVASIVE CV LAB;  Service: Cardiovascular;  Laterality: N/A; HPI: 55-yo F with Mitral Valve Repair/annuloplasty  on 07/22/15 (median sternotomy, pulmonary artery dissected away from proximal ascending aorta, distal portion of ther transverse aortic arch,bifurcation of the pulmonary artery and proximal left main pumonary artery exposed. No intraoperative complications; pt extubated same day.  Discharged home 07/27/15, no mention of change in voice or dysphagia. During f/u visit to CTS on 1/12 CXR showed Increased moderate left pleural effusion is seen. Small right pleural effusion shows no significant change. There is increased atelectasis or infiltrate in the left lower lung.Readmitted on 08/05/15. Approx one hour after eating supper pt suddenly collapsed at home. Found to be in PEA/VT arrest with 3 shocks and ROSC after 7-9 minutes requiring hypothermic protocol. CT showed no pulmonary embolism, bilateral airspace disease and pleural effusions, small amount of fluid around aorta. CTS note says "Laboratory data from last night and today are notable for leukocytosis which may be reactive and secondary to the patient's code event. However, some type of underlying infectious process cannot be ruled out." PCCM suspected aspriation from vomiting during intubation. Intubated from 1/17 to 1/24. Seen by SLP on 1/25 - started on regular diet, thin liquids  no straws, documented to be dysphonic.   2/8 PCCM consulted due to recurrent bilateral pleural effusions who reports 1L tap - protein levels suggest transudate. Has weakness of voice, hoarse and c/o dysphagia x days/weeks. MD has already made an appointment with ENT as an outpatient. Pt indicates to SLP she cannot recall if hoarse voice been ongoing since initial surgery.  No Data Recorded Assessment / Plan / Recommendation CHL IP CLINICAL IMPRESSIONS 08/29/2015 Therapy Diagnosis Mild pharyngeal phase dysphagia Clinical Impression Pt presents with a mild oropharyngeal dysphagia. Pts swallow function under fluoroscopy appears WNL when she is taking small single sips. When taking large consecutive sips, pt does have aspiration before/during the swallow with immediate sensation, possibly due to vocal fold paresis/paralysis or laryngeal damage with decreased airway protection. When instructed to tuck her chin or turn her head left, pt is able to take large consecutive sips without airway compromise. Pt demonstrates weak soft coughing throughout the exam which was unrelated to PO intake. SLP provided verbal and written instruction for pt to take small single sips, or to tuck her chin or turn her head if drinking rapidly or taking pills. Pt to f/u with ENT and Outpatient SLP as she is very concerned about her voice. Pt may need repeat objective testing following treatment if difficulty swallowing persists.  Impact on safety and function Mild aspiration risk   CHL IP TREATMENT RECOMMENDATION 08/29/2015 Treatment Recommendations Defer treatment plan to f/u with SLP   Prognosis 08/29/2015 Prognosis for Safe Diet Advancement Fair Barriers to Reach Goals -- Barriers/Prognosis Comment -- CHL IP DIET RECOMMENDATION 08/29/2015 SLP Diet Recommendations Regular solids;Thin liquid Liquid Administration via Cup Medication Administration Whole meds with liquid Compensations Minimize environmental distractions;Slow rate;Small  sips/bites;Chin tuck;Use straw to facilitate chin tuck Postural Changes Seated upright at 90 degrees   No flowsheet data found.  CHL IP FOLLOW UP RECOMMENDATIONS 08/29/2015 Follow up Recommendations Outpatient SLP   No flowsheet data found.     CHL IP ORAL PHASE 08/29/2015 Oral Phase WFL Oral - Pudding Teaspoon -- Oral - Pudding Cup -- Oral - Honey Teaspoon -- Oral - Honey Cup -- Oral - Nectar Teaspoon -- Oral - Nectar Cup -- Oral - Nectar Straw -- Oral - Thin Teaspoon -- Oral - Thin Cup -- Oral - Thin Straw -- Oral - Puree -- Oral - Mech Soft -- Oral - Regular --  Oral - Multi-Consistency -- Oral - Pill -- Oral Phase - Comment --  CHL IP PHARYNGEAL PHASE 08/29/2015 Pharyngeal Phase Impaired Pharyngeal- Pudding Teaspoon -- Pharyngeal -- Pharyngeal- Pudding Cup -- Pharyngeal -- Pharyngeal- Honey Teaspoon -- Pharyngeal -- Pharyngeal- Honey Cup -- Pharyngeal -- Pharyngeal- Nectar Teaspoon -- Pharyngeal -- Pharyngeal- Nectar Cup -- Pharyngeal -- Pharyngeal- Nectar Straw -- Pharyngeal -- Pharyngeal- Thin Teaspoon -- Pharyngeal -- Pharyngeal- Thin Cup Penetration/Aspiration before swallow Pharyngeal Material does not enter airway;Material enters airway, passes BELOW cords then ejected out Pharyngeal- Thin Straw WFL;Compensatory strategies attempted (with notebox) Pharyngeal -- Pharyngeal- Puree WFL Pharyngeal -- Pharyngeal- Mechanical Soft -- Pharyngeal -- Pharyngeal- Regular WFL Pharyngeal -- Pharyngeal- Multi-consistency -- Pharyngeal -- Pharyngeal- Pill -- Pharyngeal -- Pharyngeal Comment --  No flowsheet data found. No flowsheet data found. Harlon Ditty, [place] CCC-SLP [phone] [place], Riley Nearing 08/29/2015, 2:28 PM              Mr Card Morphology Wo/w Cm  08/14/2015  CLINICAL DATA:  Cardiac Arrest, Long QT, Cardiomyopathy EXAM: CARDIAC MRI TECHNIQUE: The patient was scanned on a 1.5 Tesla GE magnet. A dedicated cardiac coil was used. Functional imaging was done using Fiesta sequences. 2,3, and 4 chamber views were  done to assess for RWMA's. Modified Simpson's rule using a short axis stack was used to calculate an ejection fraction on a dedicated work [employer]. The patient received 20 cc of Multihance. After 10 minutes inversion recovery sequences were used to assess for infiltration and scar tissue. CONTRAST:  20 cc Multihance FINDINGS: There was mild LAE. The RA and RV were normal. The LV was mildly enlarged. There was mildly asymmetric hypertrophy of the septum Septum measures 15 mm and posterior wall 10 mm. There is no SAM. The patient is s/p mitral repair with an annuloplasty ring. There is no significant residual MR with mild diastolic bowing. The LAA has been clipped and has no flow. The aortic valve is normal as is the aortic root There is no residual PDA identified. There is a small pericardial effusion and large left pleural effusion. The distal septum and apex appear hypokinetic. The quantitative EF is 45% (EDV 70 cc, ESV 39 cc SV 31 cc) There is a markedly abnormal gadolinium uptake in the LV myocardium on inversion recovery sequences. This uptake is diffuse in the endocardial and mid epicardial areas. Although uptake is diffuse it is especially marked in the inferior wall. IMPRESSION: 1) Mildly asymmetric septal hypertrophy 15 mm. Distal septal apical hypokinesis EF 45% 2) Diffuse delayed gadolinium uptake in the subendocardial and mid epicardial areas especially marked in the inferior wall 3) S/P mitral valve repair with no residual MR and annuloplasty ring 4) Atri-Clip of the LAA with no flow 5) No residual PDA flow 6) Mild LAE 7) Small pericardial effusion 8) Large left pleural effusion Note delayed gadolinium findings may be consistent with myocarditis vs infiltrative cardiomyopathy Charlton Haws Electronically Signed   By: Charlton Haws M.D.   On: 08/14/2015 20:35   US Thoracentesis Asp Pleural Space W/img Guide  08/28/2015  INDICATION: Patient with prior history of mitral valve repair/  ligation of patent ductus arteriosus, recent PA/VT arrest, CHF, dyspnea, bilateral pleural effusions left greater than right. Request is made for diagnostic and therapeutic left thoracentesis up to 1.5 liters. EXAM: ULTRASOUND GUIDED DIAGNOSTIC AND THERAPEUTIC LEFT THORACENTESIS MEDICATIONS: None. COMPLICATIONS: None immediate. PROCEDURE: An ultrasound guided thoracentesis was thoroughly discussed with the patient and questions answered. The benefits, risks, alternatives and  complications were also discussed. The patient understands and wishes to proceed with the procedure. Written consent was obtained. Ultrasound was performed to localize and mark an adequate pocket of fluid in the left chest. The area was then prepped and draped in the normal sterile fashion. 1% Lidocaine was used for local anesthesia. Under ultrasound guidance a Safe-T-Centesis catheter was introduced. Thoracentesis was performed. The catheter was removed and a dressing applied. FINDINGS: A total of approximately 1.3 liters of turbid, amber fluid was removed. Samples were sent to the laboratory as requested by the clinical team. IMPRESSION: Successful ultrasound guided diagnostic and therapeutic left thoracentesis yielding 1.3 liters of pleural fluid. Read by: Jeananne Rama, PA-C Electronically Signed   By: Richarda Overlie M.D.   On: 08/28/2015 15:56    Microbiology: Recent Results (from the past 240 hour(s))  Culture, body fluid-bottle     Status: None   Collection Time: 08/28/15  3:30 PM  Result Value Ref Range Status   Specimen Description FLUID LEFT PLEURAL  Final   Special Requests BOTTLES DRAWN AEROBIC AND ANAEROBIC 10CCS  Final   Culture NO GROWTH 5 DAYS  Final   Report Status 09/02/2015 FINAL  Final  Gram stain     Status: None   Collection Time: 08/28/15  3:30 PM  Result Value Ref Range Status   Specimen Description FLUID LEFT PLEURAL  Final   Special Requests NONE  Final   Gram Stain   Final    ABUNDANT WBC PRESENT,  PREDOMINANTLY MONONUCLEAR NO ORGANISMS SEEN    Report Status 08/28/2015 FINAL  Final  Body fluid culture     Status: None (Preliminary result)   Collection Time: 09/03/15  3:01 PM  Result Value Ref Range Status   Specimen Description THORACENTESIS  Final   Special Requests NONE  Final   Gram Stain   Final    FEW WBC PRESENT, PREDOMINANTLY MONONUCLEAR NO ORGANISMS SEEN    Culture NO GROWTH < 24 HOURS  Final   Report Status PENDING  Incomplete     Labs: Basic Metabolic Panel:  Recent Labs Lab 09/02/15 0415 09/03/15 0500 09/03/15 1559 09/04/15 0325  NA 136 138 137 136  K 3.5 2.6* 4.0 3.7  CL 98* 91* 95* 96*  CO2 25 34* 31 31  GLUCOSE 111* 97 98 136*  BUN 5* <5* <5* <5*  CREATININE 0.54 0.54 0.49 0.54  CALCIUM 9.1 8.8* 8.9 8.9  MG  --   --  1.9  --    Liver Function Tests:  Recent Labs Lab 08/29/15 0550 09/04/15 09/04/15 0325  AST  --   --  29  ALT  --   --  19  ALKPHOS  --   --  109  BILITOT  --   --  1.3*  PROT 5.5* 6.0* 6.1*  ALBUMIN  --   --  3.1*   No results for input(s): LIPASE, AMYLASE in the last 168 hours. No results for input(s): AMMONIA in the last 168 hours. CBC:  Recent Labs Lab 09/02/15 0415 09/04/15 0325  WBC 7.1 10.3  HGB 10.6* 11.4*  HCT 33.9* 36.2  MCV 97.1 97.3  PLT 300 314   Cardiac Enzymes: No results for input(s): CKTOTAL, CKMB, CKMBINDEX, TROPONINI in the last 168 hours. BNP: BNP (last 3 results)  Recent Labs  11/21/14 2324 09/02/15 0415  BNP 196.6* 1154.6*    ProBNP (last 3 results) No results for input(s): PROBNP in the last 8760 hours.  CBG: No results for input(s):  GLUCAP in the last 168 hours.     Signed:  Carolyne Littles, MD [facility] [phone] pager

## 2015-09-04 NOTE — Progress Notes (Signed)
Patient discharged home via car with daughter. She was transferred to car via wheelchair. Patient had belongings with her including cell phone and clothing. Given extensive teaching/ information on how to administer Lovenox injection. Demonstrated for patient and printed out handout. Patient voiced understanding of process. Further explained that she needs to come back if she experiences weight gain of 2 lbs overnight. She voiced understanding that her medications were called in to pharmacy and that the pharmacist can also explain to her and her husband how to administer medication. Reiterated the importance that she take all medications prescribed and keep her follow up appointments next week. CCMD notified of discharge.  Noe Gens, RN

## 2015-09-05 ENCOUNTER — Ambulatory Visit: Payer: 59 | Admitting: Neurology

## 2015-09-05 ENCOUNTER — Ambulatory Visit (INDEPENDENT_AMBULATORY_CARE_PROVIDER_SITE_OTHER): Payer: BLUE CROSS/BLUE SHIELD | Admitting: Pharmacist Clinician (PhC)/ Clinical Pharmacy Specialist

## 2015-09-05 DIAGNOSIS — Z7901 Long term (current) use of anticoagulants: Secondary | ICD-10-CM

## 2015-09-05 DIAGNOSIS — Z9889 Other specified postprocedural states: Secondary | ICD-10-CM

## 2015-09-05 LAB — FUNGUS CULTURE W SMEAR: Fungal Smear: NONE SEEN

## 2015-09-05 LAB — POCT INR: INR: 1.5

## 2015-09-07 LAB — BODY FLUID CULTURE: CULTURE: NO GROWTH

## 2015-09-08 LAB — POCT INR: INR: 2.7

## 2015-09-09 ENCOUNTER — Ambulatory Visit (INDEPENDENT_AMBULATORY_CARE_PROVIDER_SITE_OTHER): Payer: BLUE CROSS/BLUE SHIELD | Admitting: Pharmacist Clinician (PhC)/ Clinical Pharmacy Specialist

## 2015-09-09 ENCOUNTER — Encounter: Payer: BLUE CROSS/BLUE SHIELD | Admitting: Physician Assistant

## 2015-09-09 DIAGNOSIS — Z9889 Other specified postprocedural states: Secondary | ICD-10-CM

## 2015-09-09 DIAGNOSIS — Z7901 Long term (current) use of anticoagulants: Secondary | ICD-10-CM

## 2015-09-09 NOTE — Progress Notes (Signed)
Cardiology Office Note Date:  09/09/2015  Patient ID:  Diane, [name] 1961/06/14, MRN [id number] PCP:  Diane Bussing, MD  Cardiologist:  **  **refresh   Chief Complaint: **  History of Present Illness: Diane [name] is a 55 y.o. female with history of recurrent syncope during her evaluation for this was found to have severe MR, noting possible infiltrative cardiomyopathy vs myocarditis she underwent MVRepair, Ligation of PDA, and LAA clipping on 07/22/15  And was discharged after her post-op period, at home suffered cardiac arrest and underwent hypothermia protocol, during which she had PAFib and eventually started on coumadin, she had cardiac MRI noting myocarditis vs infiltrative cardiomyopathy and an  ICD implanted, her EF remained preserved,  unfortunately after all this she continued to suffer with recurrent near syncope and syncope noted with signifcnat orthostatic hypotension managed in-pt with an abdominal binder, midodrine and florinef.  She did require a L thoracentesis x2 prior to discharge, she also suffered with hoarsness of her voice post intubation.  She had a very length hospital stay, nearly a month finally discharged to home with [facility].  She was readmitted  09/02/15 with CHF/fluid OL underwent another thoracentesis, diuresed and her Florinef was stopped to stay on midodrine for her orthostasis.  She comes in today feeling **  ** CTS f/u ** pulm f/u ** myocardial biopsy??   Past Medical History  Diagnosis Date  . Severe mitral regurgitation 07/03/2015  . Chronic combined systolic and diastolic CHF (congestive heart failure) (HCC) 07/03/2015    Grade 3 diastolic dysfunction.  LVEF 40-45% 05/2015.  Marland Kitchen Aortic aneurysm (HCC)   . Patent ductus arteriosus 07/16/2015  . Depression     SOME DEPRESSION (ON MEDS FOR 2 YRS) WHEN HER MOTHER PASSED  . 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 07/22/2015    26 mm Sorin Memo 3D ring annuloplasty with ligation of  patent ductus arteriosus and clipping of LA appendage    Past Surgical History  Procedure Laterality Date  . Ovarian cyst removal    . Tee without cardioversion N/A 07/07/2015    Procedure: TRANSESOPHAGEAL ECHOCARDIOGRAM (TEE);  Surgeon: Chilton Si, MD;  Location: [facility] ENDOSCOPY;  Service: Cardiovascular;  Laterality: N/A;  . Cardiac catheterization N/A 07/08/2015    Procedure: Right/Left Heart Cath and Coronary Angiography;  Surgeon: Marykay Lex, MD;  Location: [facility] INVASIVE CV LAB;  Service: Cardiovascular;  Laterality: N/A;  . Mitral valve repair N/A 07/22/2015    Procedure: MITRAL VALVE REPAIR;  Surgeon: Purcell Nails, MD;  Location: [facility] OR;  Service: Open Heart Surgery;  Laterality: N/A;  26 Sorin Memo 3D  . Tee without cardioversion N/A 07/22/2015    Procedure: TRANSESOPHAGEAL ECHOCARDIOGRAM (TEE);  Surgeon: Purcell Nails, MD;  Location: [facility] OR;  Service: Open Heart Surgery;  Laterality: N/A;  . Patent ductus arterious repair N/A 07/22/2015    Procedure: PATENT DUCTUS ARTERIOSUS (PDA) ligation;  Surgeon: Purcell Nails, MD;  Location: [facility] OR;  Service: Open Heart Surgery;  Laterality: N/A;  . Clipping of atrial appendage  07/22/2015    Procedure: CLIPPING OF LEFT ATRIAL APPENDAGE;  Surgeon: Purcell Nails, MD;  Location: [facility] OR;  Service: Open Heart Surgery;;  35 Atriclip Pro  . Ep implantable device N/A 08/15/2015    Procedure: ICD Implant;  Surgeon: Will Jorja Loa, MD;  Location: [facility] INVASIVE CV LAB;  Service: Cardiovascular;  Laterality: N/A;    Current Outpatient Prescriptions  Medication Sig Dispense Refill  . [facility])  75 MG tablet Take 0.5 tablets (37.5 mg total) by mouth 2 (two) times daily. 60 tablet 0  . enoxaparin (LOVENOX) 100 MG/ML injection Inject 0.5 mLs (50 mg total) into the skin daily. 8 Syringe 0  . feeding supplement (BOOST / RESOURCE BREEZE) LIQD Take 1 Container by mouth 2 (two) times daily between meals. (Patient not taking: Reported on  09/02/2015) 60 Container 0  . furosemide (LASIX) 20 MG tablet Take 1 tablet (20 mg total) by mouth as needed for fluid (be taken daily if her weight increases by >2lb in one day or 5lb in one week). 30 tablet 0  . midodrine (PROAMATINE) 10 MG tablet Take 1 tablet (10 mg total) by mouth 3 (three) times daily. 90 tablet 0  . ondansetron (ZOFRAN) 4 MG tablet Take 1 tablet (4 mg total) by mouth every 8 (eight) hours as needed for nausea or vomiting. 20 tablet 0  . pantoprazole (PROTONIX) 40 MG tablet Take 1 tablet (40 mg total) by mouth daily at 12 noon. 30 tablet 0  . warfarin (COUMADIN) 1 MG tablet Take 2 tablets (2 mg total) by mouth daily at 6 PM. Start 2/11-HOLD dose 2/10 30 tablet 0   No current facility-administered medications for this visit.    Allergies:   Vicodin; Adhesive; and Silicone   Social History:  The patient  reports that she has never smoked. She has never used smokeless tobacco. She reports that she does not drink alcohol or use illicit drugs.   Family History:  The patient's family history includes Hypertension in her mother.**  ROS:  Please see the history of present illness. Otherwise, review of systems is positive for **.   All other systems are reviewed and otherwise negative.   PHYSICAL EXAM: ** VS:  There were no vitals taken for this visit. BMI: There is no weight on file to calculate BMI. Well nourished, well developed, in no acute distress HEENT: normocephalic, atraumatic Neck: no JVD, carotid bruits or masses Cardiac:  normal S1, S2; RRR; no significant murmurs, no rubs, or gallops Lungs:  clear to auscultation bilaterally, no wheezing, rhonchi or rales Abd: soft, nontender,  + BS MS: no deformity or atrophy Ext: no edema Skin: warm and dry, no rash Neuro:  No gross deficits appreciated Psych: euthymic mood, full affect  ** PPM/ICD site is stable, no tethering or discomfort   EKG:  Done today shows ** ICD interrogation  09/04/15; Echocardiogram Study  Conclusions - Left ventricle: The cavity size was normal. There was mild concentric hypertrophy. Systolic function was moderately reduced. The estimated ejection fraction was in the range of 35% to 40%. Diffuse hypokinesis. - Aortic valve: Trileaflet; mildly thickened, mildly calcified leaflets. There was trivial regurgitation. - Mitral valve: S/P mitral valve repair with annuloplasty ring. The findings are consistent with mild stenosis. Valve area by continuity equation (using LVOT flow): 1.34 cm^2. - Tricuspid valve: There was mild regurgitation. - Pulmonary arteries: PA peak pressure: 39 mm Hg (S). - Pericardium, extracardiac: There was a left pleural effusion. Impressions: - The right ventricular systolic pressure was increased consistent with mild pulmonary hypertension.  08/12/15: Echocardiogram:  Study Conclusions - Left ventricle: The cavity size was normal. Wall thickness was increased in a pattern of mild LVH. Systolic function was normal. The estimated ejection fraction was in the range of 55% to 60%. Indeterminant diastolic function. Wall motion was normal; there were no regional wall motion abnormalities. - Aortic valve: There was no stenosis. - Mitral valve: Status post  mitral valve repair. Doppler data not sufficient to comment on degree of stenosis. There was no significant regurgitation. - Right ventricle: The cavity size was normal. Systolic function was normal. - Pulmonary arteries: No complete TR doppler jet so unable to estimate PA systolic pressure. - Pericardium, extracardiac: Pleural effusion noted. A trivial pericardial effusion was identified. Impressions: - Not a complete echo, not all doppler data was obtained. Area of PDA was not visualized. Normal LV size with mild LV hypertrophy. EF 55-60%. Normal RV size and systolic function. Status post mitral valve repair. No significant regurgitation. Doppler data was  inadequate to comment on the presence of significant mitral stenosis.  07/08/15: R/L cath Conclusion     Severe mitral regurgitation with large V waves and 4+ MR  There is mild to moderate left ventricular systolic dysfunction. With cardiac output/index moderate-severely reduced.  Angiographically normal coronary arteries      Recent Labs: 08/05/2015: TSH 2.455 09/02/2015: B Natriuretic Peptide 1154.6* 09/03/2015: Magnesium 1.9 09/04/2015: ALT 19; BUN <5*; Creatinine, Ser 0.54; Hemoglobin 11.4*; Platelets 314; Potassium 3.7; Sodium 136  09/04/2015: Cholesterol 181   Estimated Creatinine Clearance: 59.5 mL/min (by C-G formula based on Cr of 0.54).   Wt Readings from Last 3 Encounters:  09/04/15 105 lb 12.8 oz (47.991 kg)  08/29/15 108 lb (48.988 kg)  07/31/15 128 lb (58.06 kg)     Other studies reviewed: Additional studies/records reviewed today include: summarized above  DEVICE information: MDT dual chamber ICD, 08/15/15, Dr. Elberta Fortis  ASSESSMENT AND PLAN:  ** Coumadin or OAC Labs, CHADS ** AAD, labs/EKG, testing needed? ** site check ** remotes ** device info  1. VT/PEA arrest     [place] MRI ?infiltrative CM     ** biopsy     ICD  2. VHD/PDA     MVRepair, PDA ligation, and LAA clipping on 07/22/15   3. Syncope     othostatic hypotension  4. Post arrest PAFib     CHA2DS2Vasc is 3, on warfarin     ** monitored with   5. Recurrent pleral effusions     [place]: 08/11/15 left 1 liter (exudative with negative cytology)                   08/28/15 left 1.3 liters(exudative with negative cytology)                   09/03/15 left 1.4liters  PLEURAL FLUID, LEFT (SPECIMEN 1 OF 1 COLLECTED 09/03/2015): NO MALIGNANT CELLS IDENTIFIED. REACTIVE MESOTHELIAL CELLS PRESENT   Disposition: F/u with **  Current medicines are reviewed at length with the patient today.  The patient did not have any concerns regarding medicines.Judith Blonder, PA-C 09/09/2015 6:09 AM      [facility] [address] [place] [address] [phone] (office)  [phone] (fax)    This encounter was created in error - please disregard.

## 2015-09-11 ENCOUNTER — Other Ambulatory Visit: Payer: Self-pay | Admitting: Physician Assistant

## 2015-09-11 ENCOUNTER — Ambulatory Visit (INDEPENDENT_AMBULATORY_CARE_PROVIDER_SITE_OTHER): Payer: BLUE CROSS/BLUE SHIELD | Admitting: Physician Assistant

## 2015-09-11 ENCOUNTER — Encounter: Payer: Self-pay | Admitting: Physician Assistant

## 2015-09-11 ENCOUNTER — Ambulatory Visit
Admission: RE | Admit: 2015-09-11 | Discharge: 2015-09-11 | Disposition: A | Discharge status: Home / Self Care | Payer: BLUE CROSS/BLUE SHIELD | Source: Ambulatory Visit | Attending: Physician Assistant | Admitting: Physician Assistant

## 2015-09-11 VITALS — BP 84/58 | HR 96 | Ht 62.0 in | Wt 104.0 lb

## 2015-09-11 DIAGNOSIS — Z8674 Personal history of sudden cardiac arrest: Secondary | ICD-10-CM

## 2015-09-11 DIAGNOSIS — J90 Pleural effusion, not elsewhere classified: Secondary | ICD-10-CM

## 2015-09-11 DIAGNOSIS — I9589 Other hypotension: Secondary | ICD-10-CM

## 2015-09-11 DIAGNOSIS — Z9889 Other specified postprocedural states: Secondary | ICD-10-CM | POA: Diagnosis not present

## 2015-09-11 DIAGNOSIS — I5042 Chronic combined systolic (congestive) and diastolic (congestive) heart failure: Secondary | ICD-10-CM

## 2015-09-11 LAB — CBC
HEMATOCRIT: 34.5 % — AB (ref 36.0–46.0)
Hemoglobin: 10.9 g/dL — ABNORMAL LOW (ref 12.0–15.0)
MCH: 29.9 pg (ref 26.0–34.0)
MCHC: 31.6 g/dL (ref 30.0–36.0)
MCV: 94.8 fL (ref 78.0–100.0)
MPV: 10.5 fL (ref 8.6–12.4)
PLATELETS: 302 10*3/uL (ref 150–400)
RBC: 3.64 MIL/uL — AB (ref 3.87–5.11)
RDW: 15.5 % (ref 11.5–15.5)
WBC: 7.5 10*3/uL (ref 4.0–10.5)

## 2015-09-11 NOTE — Patient Instructions (Addendum)
Medication Instructions:  None  Labwork: CBC and blood cultures x 2 today.  Please give prescription to lab at [facility]  Testing/Procedures: A chest x-ray takes a picture of the organs and structures inside the chest, including the heart, lungs, and blood vessels. This test can show several things, including, whether the heart is enlarges; whether fluid is building up in the lungs; and whether pacemaker / defibrillator leads are still in place.   Follow-Up: Your physician recommends that you schedule a follow-up appointment in: 1 week with Dr. Duke Salvia or an extender with EKG.   Any Other Special Instructions Will Be Listed Below (If Applicable).     If you need a refill on your cardiac medications before your next appointment, please call your pharmacy.

## 2015-09-11 NOTE — Progress Notes (Signed)
Cardiology Office Note   Date:  09/11/2015   ID:  Diane [name], DOB 01/14/1961, MRN [id number]  PCP:  Darrow Bussing, MD  Cardiologist:  Dr. Duke Salvia    Chief Complaint  Patient presents with  . Follow-up    seen for Dr. Duke Salvia      History of Present Illness: Diane [name] is a 55 y.o. female who presents for post hospital cardiology followup. She had a history of severe mitral regurgitation, combined systolic and diastolic heart failure and syncope. She initially underwent mitral valve repair on 07/22/2015 with ligation of patent ductus arteriosus and the clipping of the left atrial appendage. Preoperative cath in December 2016 showed no significant coronary artery disease. EF was 40-45%. Her immediate postsurgical course was uncomplicated. She had prolonged readmission from 1/17-2/10 for VT/PEA arrest, subsequent therapeutic hypothermia. Repeat echo showed improvement of EF to 50-55% with no regional wall motion abnormality, mitral valve was functioning properly with only mild ASP. Cardiac MRI suggestive of infiltrative cardiomyopathy. EF was 45% per MRI. She was also noted to have long QT interval. She was evaluated by EP and eventually underwent medtronic AICD placement on 08/15/2015. He was noted to have persistent orthostatic hypotension, aspiration pneumonia, left pleural effusion s/p thoracentesis on 08/30/2015. She also had paroxysmal atrial fibrillation post arrest, per EP note, plan was for short-term anticoagulation. She was initially recommended to be discharged to skilled nursing facility, however she declined and opted to be discharged home. She returned on 09/02/2015 after progressive worsening shortness of breath and abdominal distention with weight gain. Chest x-ray obtained in the ED showed bilateral pleural effusion with basilar atelectasis and consolidation, L>R. Her weight was increased for 108 pounds up to 122 pounds. Note, her Lasix was held on previous discharge likely  related to issue with hypotension. It was felt prior part of her fluid overload was related to Florinef. She underwent repeat thoracentesis with removal of 1.4 L serosanguineous fluid. She also underwent IV diuresis. Due to concern of infiltrative cardiomyopathy or cardiac MRI, outpatient endomyocardial biopsy has [facility] was recommended. Her EKG and echo was not consistent with amyloidosis. Repeat echocardiogram on 09/04/2015 shows EF 35-40%, diffuse hypokinesis, presence of mitral valve repair with annuloplasty ring, mild MS, mild TR, peak PA pressure 39. She was discharged on 20 mg Lasix daily if her weight increase by >2 lbs.  Since discharge, patient has lost significant weight. Her weight is stable at 104 pounds. Her discharge weight was 105 pounds on 09/04/2015. She has not been taking her Lasix as her weight has not increased her breathing is about the same. Only complaint today is she usually become dizzy especially in the morning when she first gets up. She denies any recent fever or chill. She is still on Midodrine to help with significant orthostatic hypotension. Despite this, her blood pressure has been running low. She denies any feeling of passing out. 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     Past Medical History  Diagnosis Date  . Severe mitral regurgitation 07/03/2015  . Chronic combined systolic and diastolic CHF (congestive heart failure) (HCC) 07/03/2015    Grade 3 diastolic dysfunction.  LVEF 40-45% 05/2015.  Marland Kitchen Aortic aneurysm (HCC)   . Patent ductus arteriosus 07/16/2015  . Depression     SOME DEPRESSION (ON MEDS FOR 2 YRS) WHEN HER MOTHER PASSED  . S/P  mitral valve repair and ligation of patent ductus arteriosus 07/22/2015    26 mm Sorin Memo 3D ring annuloplasty with ligation of patent ductus arteriosus and clipping of LA appendage    Past Surgical History   Procedure Laterality Date  . Ovarian cyst removal    . Tee without cardioversion N/A 07/07/2015    Procedure: TRANSESOPHAGEAL ECHOCARDIOGRAM (TEE);  Surgeon: Chilton Si, MD;  Location: [facility] ENDOSCOPY;  Service: Cardiovascular;  Laterality: N/A;  . Cardiac catheterization N/A 07/08/2015    Procedure: Right/Left Heart Cath and Coronary Angiography;  Surgeon: Marykay Lex, MD;  Location: [facility] INVASIVE CV LAB;  Service: Cardiovascular;  Laterality: N/A;  . Mitral valve repair N/A 07/22/2015    Procedure: MITRAL VALVE REPAIR;  Surgeon: Purcell Nails, MD;  Location: [facility] OR;  Service: Open Heart Surgery;  Laterality: N/A;  26 Sorin Memo 3D  . Tee without cardioversion N/A 07/22/2015    Procedure: TRANSESOPHAGEAL ECHOCARDIOGRAM (TEE);  Surgeon: Purcell Nails, MD;  Location: [facility] OR;  Service: Open Heart Surgery;  Laterality: N/A;  . Patent ductus arterious repair N/A 07/22/2015    Procedure: PATENT DUCTUS ARTERIOSUS (PDA) ligation;  Surgeon: Purcell Nails, MD;  Location: [facility] OR;  Service: Open Heart Surgery;  Laterality: N/A;  . Clipping of atrial appendage  07/22/2015    Procedure: CLIPPING OF LEFT ATRIAL APPENDAGE;  Surgeon: Purcell Nails, MD;  Location: [facility] OR;  Service: Open Heart Surgery;;  35 Atriclip Pro  . Ep implantable device N/A 08/15/2015    Procedure: ICD Implant;  Surgeon: Will Jorja Loa, MD;  Location: [facility] INVASIVE CV LAB;  Service: Cardiovascular;  Laterality: N/A;     Current Outpatient Prescriptions  Medication Sig Dispense Refill  . buPROPion (WELLBUTRIN) 75 MG tablet Take 0.5 tablets (37.5 mg total) by mouth 2 (two) times daily. 60 tablet 0  . ENSURE (ENSURE) Take 237 mLs by mouth once.    . midodrine (PROAMATINE) 10 MG tablet Take 1 tablet (10 mg total) by mouth 3 (three) times daily. 90 tablet 0  . ondansetron (ZOFRAN) 4 MG tablet Take 1 tablet (4 mg total) by mouth every 8 (eight) hours as needed for nausea or vomiting. 20 tablet 0  . pantoprazole (PROTONIX) 40 MG tablet  Take 1 tablet (40 mg total) by mouth daily at 12 noon. 30 tablet 0  . warfarin (COUMADIN) 1 MG tablet Take 2 tablets (2 mg total) by mouth daily at 6 PM. Start 2/11-HOLD dose 2/10 30 tablet 0   No current facility-administered medications for this visit.    Allergies:   Vicodin; Adhesive; and Silicone    Social History:  The patient  reports that she has never smoked. She has never used smokeless tobacco. She reports that she does not drink alcohol or use illicit drugs.   Family History:  The patient's family history includes Hypertension in her mother.    ROS:  Please see the history of present illness.   Otherwise, review of systems are positive for dizziness, hypotension.   All other systems are reviewed and negative.    PHYSICAL EXAM: VS:  BP 84/58 mmHg  Pulse 96  Ht 5\' 2"  (1.575 m)  Wt 104 lb (47.174 kg)  BMI 19.02 kg/m2  SpO2 98% , BMI Body mass index is 19.02 kg/(m^2). GEN: Well nourished, well developed, in no acute distress HEENT: normal Neck: no JVD, carotid bruits, or masses Cardiac: RRR; no murmurs, rubs, or gallops,no edema  Respiratory: normal work of breathing. Largely clear  to auscultation, noticeably decreased breath sound at the left bases. GI: soft, nontender, nondistended, + BS MS: no deformity or atrophy Skin: warm and dry, no rash Neuro:  Strength and sensation are intact Psych: euthymic mood, full affect   EKG:  EKG is not ordered today.   Recent Labs: 08/05/2015: TSH 2.455 09/02/2015: B Natriuretic Peptide 1154.6* 09/03/2015: Magnesium 1.9 09/04/2015: ALT 19; BUN <5*; Creatinine, Ser 0.54; Hemoglobin 11.4*; Platelets 314; Potassium 3.7; Sodium 136    Lipid Panel    Component Value Date/Time   CHOL 181 09/04/2015 0000      Wt Readings from Last 3 Encounters:  09/11/15 104 lb (47.174 kg)  09/04/15 105 lb 12.8 oz (47.991 kg)  08/29/15 108 lb (48.988 kg)      Other studies Reviewed: Additional studies/ records that were reviewed today  include:   Echo 08/12/2015  LV EF: 55% -  60%  ------------------------------------------------------------------- History:  PMH: Severe Mitral Regurgitation, PDA Ligation, Mitral Valve Repair, Cardiac Arrest, Cardiogenic Shock. Limited for EF.  ------------------------------------------------------------------- Study Conclusions  - Left ventricle: The cavity size was normal. Wall thickness was increased in a pattern of mild LVH. Systolic function was normal. The estimated ejection fraction was in the range of 55% to 60%. Indeterminant diastolic function. Wall motion was normal; there were no regional wall motion abnormalities. - Aortic valve: There was no stenosis. - Mitral valve: Status post mitral valve repair. Doppler data not sufficient to comment on degree of stenosis. There was no significant regurgitation. - Right ventricle: The cavity size was normal. Systolic function was normal. - Pulmonary arteries: No complete TR doppler jet so unable to estimate PA systolic pressure. - Pericardium, extracardiac: Pleural effusion noted. A trivial pericardial effusion was identified.  Impressions:  - Not a complete echo, not all doppler data was obtained. Area of PDA was not visualized. Normal LV size with mild LV hypertrophy. EF 55-60%. Normal RV size and systolic function. Status post mitral valve repair. No significant regurgitation. Doppler data was inadequate to comment on the presence of significant mitral stenosis.   Echo 09/04/2015  LV EF: 35% -  40%  ------------------------------------------------------------------- History:  PMH: Status post Mitral valve repair. Atrial fibrillation. Risk factors: PDA ligation. ICD in place. Acute pulmonary edema.  ------------------------------------------------------------------- Study Conclusions  - Left ventricle: The cavity size was normal. There was mild concentric hypertrophy.  Systolic function was moderately reduced. The estimated ejection fraction was in the range of 35% to 40%. Diffuse hypokinesis. - Aortic valve: Trileaflet; mildly thickened, mildly calcified leaflets. There was trivial regurgitation. - Mitral valve: S/P mitral valve repair with annuloplasty ring. The findings are consistent with mild stenosis. Valve area by continuity equation (using LVOT flow): 1.34 cm^2. - Tricuspid valve: There was mild regurgitation. - Pulmonary arteries: PA peak pressure: 39 mm Hg (S). - Pericardium, extracardiac: There was a left pleural effusion.  Impressions:  - The right ventricular systolic pressure was increased consistent with mild pulmonary hypertension.   Cath 07/08/2015 Conclusion     Severe mitral regurgitation with large V waves and 4+ MR  There is mild to moderate left ventricular systolic dysfunction. With cardiac output/index moderate-severely reduced.  Angiographically normal coronary arteries   Angiographic and Right Heart Cath confirmed severe MR with normal PA pressures. Consistently reduced EF by LV gram as compared to echocardiogram. Cardiac output also confirms at least moderately reduced function.   Plan: Standard post radial/brachial cath care with sheath removal. Expected discharge following bed rest  Patient will follow-up with Dr.  Donaldson plans to be referred to Dr. Cornelius Moras for scheduling of MVR.      Review of the above records demonstrates:   Complicated recent hospital course including initial workup for mitral valve repair using early January, subsequent cardiac arrest require placement of ICD. Later came back with acute heart failure and recurrent pleural effusion, course complicated by severe orthostatic hypotension requiring midodrine and aspiration pneumonia.   ASSESSMENT AND PLAN:  1.  Recurrent pleural effusion  - On physical exam, she likely has recurrence of left lower lobe pleural effusion  despite a recent thoracentesis. However the size symptoms to be small, will confirm with chest x-ray.  2. Hypotension  - significant orthostatic hypotension on Midodrine, her blood pressure still in the high 80s today.  - She will need one-week follow-up for close observation, given complicated recent admission, she is at very high risk for readmission. I have obtained a CBC and blood culture x2 today to make sure there is no infectious process and that she is not septic given recent prolonged hospitalization. She denies any fever or chills.  -  She states she has some dizziness when she get up in the morning, however denies any feeling of passing out. I advised her to seek medical attention at Hospital if she does feel like she is going to pass out.   3. Chronic combined systolic and diastolic HF:   - She is largely euvolemic but she does have sign of recurrent left lower lobe pleural effusion. I am hesitant to make her take Lasix given her low her blood pressure illness. Other than left lower lobe decreased breath sound, her right lung is very clear, majority was her left lung is also clear I do not think she is significantly overloaded, in fact I think she is close to euvolemic level despite recurrence of left pleural effusion.  - I am fine with her taking Lasix on an as needed basis if her weight increases more than 2 pounds.  - Due to concern of infiltrative cardiomyopathy or cardiac MRI, outpatient endomyocardial biopsy has [facility] was recommended. Will discuss with M.D. regarding referral to [facility] for endomyocardial biopsy.  4. severe mitral regurgitation s/p mitral valve repair 07/22/2015 with ligation of patent ductus arteriosus and the clipping of the left atrial appendage  5. VT/PEA arrest underwent medtronic AICD placement on 08/15/2015: on next followup, if stable, need to arrange device check with EP  6. Post cardiac arrest afib on short course of coumadin   I did not bill for transition of  care visit as I do not see a phone note documented to check on the patient within 48 hours after discharge.    Current medicines are reviewed at length with the patient today.  The patient does not have concerns regarding medicines.  The following changes have been made:  no change  Labs/ tests ordered today include:   Orders Placed This Encounter  Procedures  . DG Chest 2 View     Disposition:   FU with cardiology in 1 week  Signed, Azalee Course, [place]  09/11/2015 7:46 PM    [facility] [address] [place], [place], [place]  [address] Phone: [phone]; Fax: [phone]

## 2015-09-15 ENCOUNTER — Ambulatory Visit (INDEPENDENT_AMBULATORY_CARE_PROVIDER_SITE_OTHER): Payer: BLUE CROSS/BLUE SHIELD | Admitting: Pharmacist

## 2015-09-15 DIAGNOSIS — Z7901 Long term (current) use of anticoagulants: Secondary | ICD-10-CM

## 2015-09-15 DIAGNOSIS — Z9889 Other specified postprocedural states: Secondary | ICD-10-CM | POA: Diagnosis not present

## 2015-09-15 LAB — POCT INR: INR: 2.3

## 2015-09-15 NOTE — Progress Notes (Signed)
Cardiology Office Note   Date:  09/16/2015   ID:  Diane [name], DOB 07-Apr-1961, MRN [id number]  PCP:  Darrow Bussing, MD  Cardiologist:   Madilyn Hook, MD  Electrophysiologist: Dr. Berton Mount  Chief Complaint  Patient presents with  . Follow-up    1 week per [facility] [place]--close observation for readmission//pt c/o Dizziness when she wakes up in the middle of the night, no other Sx.     [name] is a 55 y.o. female who presents for follow up on mitral valve repair and chronic systolic and diastolic heart failure.  Interval History 09/16/15: Diane [name] underwent repair of her mitral valve with a 26 mm Sorin Memo 3D annuloplasty ring on 07/22/15.  Intra-operatively she was noted to have a PDA with left to right shunting that was ligated in the OR. The left atrium was also clipped.   Her post operative course was initially unremarkable and she was discharged on post-op day 5.  On 1/17 she suffered a cardiac arrest at home and was resuscitated by EMS.  The initial rhythm was unclear but she had VT upon arrival in the ED, which was followed by atrial fibrillation.  She was treated with hypothermia and has no residual cognitive deficits.  During that hospitalization she underwent cardiac MRI with LVEF 45% with hypokinesis of the distal septum and apex.  There was diffuse, delayed enhancement in the subendocardium and mid epicardial region, most prominent in the inferior wall.  This was concerning for myocarditis or infiltrative cardiomyopathy.  Her EKG showed QT prolongation and she had an ICD placed for secondary prevention.  Her hospitalization was delayed by severe orthostatic hypotensiodn and recurrent syncope requiring high doses of florinef and midodrine.  She was discharged home but was readmitted with volume overload on 2/16.  Echo showed LVEF 35-40% with diffuse hypokinesis and her mitral valve was functioning well.  She also underwent L thoracentesis with removal of  1.4L and received IV lasix.  She was discharged home on 2/16 and followed up in clinic on 2/23.  At that time she reported some dizziness and was hypotensive to 84/58.   Diane [name] has been doing well since she saw Azalee Course, PA-C one week ago.  She had a CXR that showed some reaccumulation of her L pleural effusion.  CBC was negative for infection. She notes some mild fatigue and dizziness in the AM that improves throughout the day.  Her appetite has  Improved and she is now eating 3 meals each day without using any nausea medication.  She looks forward to starting physical therapy and occupational therapy this week.  She continues to have hoarseness and a chronic, non-productive cough.  She denies lower extremity edema, orthopnea or PND.  Her breathing is slowly improving.    Interval History 12/15.16: Diane [name] underwent echocardiography that revealed severe mitral regurgitation and a reduced LVEF (45-50%).  The MR was due to tethering of the posterior leaflet.  She was also noted to have grade 3 diastolic dysfunction with mid basal to mid inferolateral wall hypokinesis.  Diane [name] continues to feel poorly.  She has constant coughing that makes it hard for her to breathe.  She also notes lower extremity edema.  She has been limiting her work because she can no longer stand all day.  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  She is unable to do a full day's work without extreme fatigue.  She also  has constant chest discomfort.  History of Present Illness 05/29/15: Diane [name] reports that her symptoms began approximately 1-1/2 years ago. In that time. She's had 2 episodes of syncope lasting up to 15 minutes. She's had lightheadedness and dizziness upon standing and lasts may have the most severe episode that resulted in syncope. She was evaluated in the emergency department, at which time her blood pressure and heart rate were normal.  Cardiac enzymes were negative and her EKG showed  borderline QT prolongation, but was otherwise unremarkable.  D-dimer was negative. She was referred to follow-up with her primary care physician as well as cardiology. In the interim she also saw a neurologist, who did an MRI of the brain that was unremarkable.  She had an EEG to evaluate this event as well as recurrent episodes of right facial droop.  EEG was unremarkable.  Diane [name] has noted shortness of breath and lightheadedness when walking up an incline.  She has to sit down in order to avoid passing out.  These episodes are associated with chest tightness.  The episodes last for a few minutes and improve with rest.  The tightness is 5-6/10 in severity.  It is sometimes associated with nausea but no diaphoresis. She has been unable to exercise due to these symptoms.  She had an exercise Myoview in January 2015 that was negative for ischemia.  She denies palpitations, lower extremity edema, orthopnea or PND.  Diane [name] is concerned because a friend had similar symptoms and was ultimately diagnosed with valvular heart disease requiring surgical intervention.  Diane [name] also notes that her taste buds have changed. She no longer like the taste of salty or sweet things. She's also had a cough for 4 years that is nonproductive and not associated with fever or chills.   Past Medical History  Diagnosis Date  . Severe mitral regurgitation 07/03/2015  . Chronic combined systolic and diastolic CHF (congestive heart failure) (HCC) 07/03/2015    Grade 3 diastolic dysfunction.  LVEF 40-45% 05/2015.  Marland Kitchen Aortic aneurysm (HCC)   . Patent ductus arteriosus 07/16/2015  . Depression     SOME DEPRESSION (ON MEDS FOR 2 YRS) WHEN HER MOTHER PASSED  . 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 07/22/2015    26 mm Sorin Memo 3D ring annuloplasty with ligation of patent ductus arteriosus and clipping of LA appendage    Past Surgical History  Procedure Laterality Date  . Ovarian cyst  removal    . Tee without cardioversion N/A 07/07/2015    Procedure: TRANSESOPHAGEAL ECHOCARDIOGRAM (TEE);  Surgeon: Chilton Si, MD;  Location: [facility] ENDOSCOPY;  Service: Cardiovascular;  Laterality: N/A;  . Cardiac catheterization N/A 07/08/2015    Procedure: Right/Left Heart Cath and Coronary Angiography;  Surgeon: Marykay Lex, MD;  Location: [facility] INVASIVE CV LAB;  Service: Cardiovascular;  Laterality: N/A;  . Mitral valve repair N/A 07/22/2015    Procedure: MITRAL VALVE REPAIR;  Surgeon: Purcell Nails, MD;  Location: [facility] OR;  Service: Open Heart Surgery;  Laterality: N/A;  26 Sorin Memo 3D  . Tee without cardioversion N/A 07/22/2015    Procedure: TRANSESOPHAGEAL ECHOCARDIOGRAM (TEE);  Surgeon: Purcell Nails, MD;  Location: [facility] OR;  Service: Open Heart Surgery;  Laterality: N/A;  . Patent ductus arterious repair N/A 07/22/2015    Procedure: PATENT DUCTUS ARTERIOSUS (PDA) ligation;  Surgeon: Purcell Nails, MD;  Location: [facility] OR;  Service: Open Heart Surgery;  Laterality: N/A;  . Clipping of atrial appendage  07/22/2015    Procedure: CLIPPING OF LEFT ATRIAL APPENDAGE;  Surgeon: Purcell Nails, MD;  Location: [facility] OR;  Service: Open Heart Surgery;;  35 Atriclip Pro  . Ep implantable device N/A 08/15/2015    Procedure: ICD Implant;  Surgeon: Will Jorja Loa, MD;  Location: [facility] INVASIVE CV LAB;  Service: Cardiovascular;  Laterality: N/A;     Current Outpatient Prescriptions  Medication Sig Dispense Refill  . buPROPion (WELLBUTRIN) 75 MG tablet Take 0.5 tablets (37.5 mg total) by mouth 2 (two) times daily. 60 tablet 0  . ENSURE (ENSURE) Take 237 mLs by mouth once.    . midodrine (PROAMATINE) 10 MG tablet Take 1 tablet (10 mg total) by mouth 3 (three) times daily. 90 tablet 0  . pantoprazole (PROTONIX) 40 MG tablet Take 1 tablet (40 mg total) by mouth daily at 12 noon. 30 tablet 0  . warfarin (COUMADIN) 1 MG tablet Take 2 tablets (2 mg total) by mouth daily at 6 PM. Start 2/11-HOLD dose 2/10 30  tablet 0   No current facility-administered medications for this visit.    Allergies:   Vicodin; Adhesive; and Silicone    Social History:  The patient  reports that she has never smoked. She has never used smokeless tobacco. She reports that she does not drink alcohol or use illicit drugs.   Family History:  The patient's family history includes Hypertension in her mother.    ROS:  Please see the history of present illness.  Otherwise, review of systems are positive for none.   All other systems are reviewed and negative.    PHYSICAL EXAM: VS:  BP 104/68 mmHg  Pulse 84  Ht 5\' 2"  (1.575 m)  Wt 48.263 kg (106 lb 6.4 oz)  BMI 19.46 kg/m2 , BMI Body mass index is 19.46 kg/(m^2). GENERAL:  Chronically ill-appearing.  No acute distress. HEENT:  Pupils equal round and reactive, fundi not visualized, oral mucosa unremarkable.  Hoarse voice NECK:  No jugular venous distention, waveform within normal limits, carotid upstroke brisk and symmetric, no bruits, no thyromegaly LYMPHATICS:  No cervical adenopathy LUNGS:  Diminished breath sounds at left base. HEART:  RRR.  PMI not displaced or sustained,S1 and S2 within normal limits, no S3, no S4, no clicks, no rubs, no murmurs ABD:  Flat, positive bowel sounds normal in frequency in pitch, no bruits, no rebound, no guarding, no midline pulsatile mass, no hepatomegaly, no splenomegaly EXT:  2 plus pulses throughout, no edema, no cyanosis no clubbing SKIN:  No rashes no nodules NEURO:  Cranial nerves II through XII grossly intact, motor grossly intact throughout PSYCH:  Cognitively intact, oriented to person place and time   EKG:  EKG is not ordered today.  Echo 06/04/15: Study Conclusions  - Left ventricle: The cavity size was normal. Wall thickness was increased in a pattern of mild LVH. Systolic function was mildly reduced. The estimated ejection fraction was in the range of 45% to 50%. Abnormal GLPSS at -11%, with inferior and  inferolateral strain abnormality. Basal to mid inferolateral wall hypokinesis. Doppler parameters are consistent with restrictive left ventricular relaxation (grade 3 diastolic dysfunction). The E/A ratio is >2.5. The E/e&' ratio is >20, suggesting markedly elevated LV filling pressure. - Mitral valve: Mildly thickened leaflets with tethering of the posterior leaflet. There is severe, posteriorly directed regurgitation which hugs the atrial free wall. Reversal of flow is seen in the pulmonary vein. - Left atrium: Severely dilated at 52 ml/m2. - Right atrium: The atrium  was mildly dilated. - Tricuspid valve: There was mild regurgitation. - Pulmonary arteries: PA peak pressure: 41 mm Hg (S). - Inferior vena cava: The vessel was normal in size. The respirophasic diameter changes were in the normal range (>= 50%), consistent with normal central venous pressure. - Pericardium, extracardiac: A trivial pericardial effusion was identified posterior to the heart. Features were not consistent with tamponade physiology.  Impressions:  - LVEF 45-50%, mild LVH, abnormal GLPSS at -11% with inferolateral strain and wall motion abnormalities. There is tethering of the posterior mitral leaflet and severe posteriorly directed mitral regurgitation. Restrictive diastolic dysfunction with likely elevated LV filling pressures. Severely dilated LA at 52 ml/m2, mild TR, RVSP elevated at 41 mmHg (consistent with mild pulmonary venous hypertension), trivial posterior pericardial effusion without tamponade physiology.  PFT 11/05/14:  FEV1  1.9 L FVC : 2.4 L FEV1 and FVC are reduced, but the FEV1/FVC ratio is normal.  Following adminitration of bronchodilator there was no significant response.  Consistent with minimal obstructive airway disease.    LHC 07/08/15:  Severe mitral regurgitation with large V waves and 4+ MR  There is mild to moderate left ventricular  systolic dysfunction. With cardiac output/index moderate-severely reduced.  Angiographically normal coronary arteries  Cardiac MRI 08/14/15: IMPRESSION: 1) Mildly asymmetric septal hypertrophy 15 mm. Distal septal apical hypokinesis EF 45%  2) Diffuse delayed gadolinium uptake in the subendocardial and mid epicardial areas especially marked in the inferior wall  3) S/P mitral valve repair with no residual MR and annuloplasty ring  4) Atri-Clip of the LAA with no flow  5) No residual PDA flow  6) Mild LAE  7) Small pericardial effusion  8) Large left pleural effusion  Note delayed gadolinium findings may be consistent with myocarditis vs infiltrative cardiomyopathy   Echo 09/04/15: Study Conclusions  - Left ventricle: The cavity size was normal. There was mild concentric hypertrophy. Systolic function was moderately reduced. The estimated ejection fraction was in the range of 35% to 40%. Diffuse hypokinesis. - Aortic valve: Trileaflet; mildly thickened, mildly calcified leaflets. There was trivial regurgitation. - Mitral valve: S/P mitral valve repair with annuloplasty ring. The findings are consistent with mild stenosis. Valve area by continuity equation (using LVOT flow): 1.34 cm^2. - Tricuspid valve: There was mild regurgitation. - Pulmonary arteries: PA peak pressure: 39 mm Hg (S). - Pericardium, extracardiac: There was a left pleural effusion.  Impressions:  - The right ventricular systolic pressure was increased consistent with mild pulmonary hypertension.   Recent Labs: 08/05/2015: TSH 2.455 09/02/2015: B Natriuretic Peptide 1154.6* 09/03/2015: Magnesium 1.9 09/04/2015: ALT 19; BUN <5*; Creatinine, Ser 0.54; Potassium 3.7; Sodium 136 09/11/2015: Hemoglobin 10.9*; Platelets 302    Lipid Panel    Component Value Date/Time   CHOL 181 09/04/2015 0000      Wt Readings from Last 3 Encounters:  09/16/15 48.263 kg (106 lb 6.4 oz)    09/11/15 47.174 kg (104 lb)  09/04/15 47.991 kg (105 lb 12.8 oz)      ASSESSMENT AND PLAN:  # Chronic systolic and diastolic heart failure: LVEF remains depressed post-operatively.  The etiology of her heart failure is unknown, given that her LVEF was reduced post-op and she was found to have structurally normal valve in the OR without myoxomatous changes or perforations.  She has no coronary artery disease and no history of hypertension.  Her cardiac MRI was notable for delayed enhancement that is concerning for infiltrative cardiomyopathy, though her echo and EKG are not consistent with cardiac  amyloidosis.  We will refer her to Dr. Nicholes Mango for further evaluation and will have her set up for endomyocardial biopsy in [place].  She continues to require midodrine for BP support, so no beta blocker or ACE-I/ARB at this time.  Clinically she is improving and appears euvolemic other than her recurring pericardial effusion.  Warfarin will need to be held for her biopsy.  # Mitral regurgitation s/p mitral valve repair:  Diane [name] valve is now stable and has no significant regurgitation post repair.  # Paroxysmal atrial fibrillation: Diane [name] remains in sinus rhythm.  LA appendage was clipped in surgery.  There was no flow in the LAA on cardiac MRI.  Therefore, it is reasonable to hold warfarin for any necessary procedures without the need for bridging anticoagulation.  This patients CHA2DS2-VASc Score and unadjusted Ischemic Stroke Rate (% per year) is equal to 2.2 % stroke rate/year from a score of 2  Above score calculated as 1 point each if present [CHF, HTN, DM, Vascular=MI/PAD/Aortic Plaque, Age if 65-74, or Female] Above score calculated as 2 points each if present [Age > 75, or Stroke/TIA/TE]    # Decondition: Diane [name] is doing much better but is still quite weak.  She starts PT/OT today, which is great.  She is finally eating full means and her BP is starting to improve.  I  suspect that if this continues we may be able to wean her midodrine over time.  We will continue to follow her closely.  # ICD: Managed by Dr. Graciela Husbands.   Current medicines are reviewed at length with the patient today.  The patient does not have concerns regarding medicines.  The following changes have been made:  no change  Labs/ tests ordered today include:   Orders Placed This Encounter  Procedures  . Ambulatory referral to Cardiology    Disposition:   FU with Ej Pinson C. Duke Salvia, MD in 2 months   Signed, Madilyn Hook, MD  09/16/2015 10:14 PM    [facility]

## 2015-09-16 ENCOUNTER — Ambulatory Visit (INDEPENDENT_AMBULATORY_CARE_PROVIDER_SITE_OTHER): Payer: BLUE CROSS/BLUE SHIELD | Admitting: Cardiovascular Disease

## 2015-09-16 ENCOUNTER — Encounter: Payer: Self-pay | Admitting: Cardiovascular Disease

## 2015-09-16 VITALS — BP 104/68 | HR 84 | Ht 62.0 in | Wt 106.4 lb

## 2015-09-16 DIAGNOSIS — Z9889 Other specified postprocedural states: Secondary | ICD-10-CM | POA: Diagnosis not present

## 2015-09-16 DIAGNOSIS — I951 Orthostatic hypotension: Secondary | ICD-10-CM

## 2015-09-16 DIAGNOSIS — I480 Paroxysmal atrial fibrillation: Secondary | ICD-10-CM | POA: Diagnosis not present

## 2015-09-16 DIAGNOSIS — I5042 Chronic combined systolic (congestive) and diastolic (congestive) heart failure: Secondary | ICD-10-CM | POA: Diagnosis not present

## 2015-09-16 DIAGNOSIS — R5381 Other malaise: Secondary | ICD-10-CM | POA: Diagnosis not present

## 2015-09-16 NOTE — Patient Instructions (Signed)
You have been referred to Dr Alger Memos in the heart failure clinic.  Dr Duke Salvia recommends that you schedule a follow-up appointment in 2 months.  If you need a refill on your cardiac medications before your next appointment, please call your pharmacy.

## 2015-09-17 LAB — CULTURE, BLOOD (SINGLE)
Organism ID, Bacteria: NO GROWTH
Organism ID, Bacteria: NO GROWTH

## 2015-09-25 ENCOUNTER — Other Ambulatory Visit: Payer: Self-pay | Admitting: Thoracic Surgery (Cardiothoracic Vascular Surgery)

## 2015-09-25 DIAGNOSIS — I5042 Chronic combined systolic (congestive) and diastolic (congestive) heart failure: Secondary | ICD-10-CM

## 2015-09-29 ENCOUNTER — Ambulatory Visit (INDEPENDENT_AMBULATORY_CARE_PROVIDER_SITE_OTHER): Payer: BLUE CROSS/BLUE SHIELD | Admitting: Thoracic Surgery (Cardiothoracic Vascular Surgery)

## 2015-09-29 ENCOUNTER — Ambulatory Visit
Admission: RE | Admit: 2015-09-29 | Discharge: 2015-09-29 | Disposition: A | Discharge status: Home / Self Care | Payer: BLUE CROSS/BLUE SHIELD | Source: Ambulatory Visit | Attending: Thoracic Surgery (Cardiothoracic Vascular Surgery) | Admitting: Thoracic Surgery (Cardiothoracic Vascular Surgery)

## 2015-09-29 ENCOUNTER — Ambulatory Visit (INDEPENDENT_AMBULATORY_CARE_PROVIDER_SITE_OTHER): Payer: BLUE CROSS/BLUE SHIELD | Admitting: *Deleted

## 2015-09-29 ENCOUNTER — Encounter: Payer: Self-pay | Admitting: Thoracic Surgery (Cardiothoracic Vascular Surgery)

## 2015-09-29 ENCOUNTER — Other Ambulatory Visit: Payer: Self-pay | Admitting: *Deleted

## 2015-09-29 VITALS — BP 120/71 | HR 94 | Resp 18 | Ht 62.0 in | Wt 105.0 lb

## 2015-09-29 DIAGNOSIS — Z7901 Long term (current) use of anticoagulants: Secondary | ICD-10-CM | POA: Diagnosis not present

## 2015-09-29 DIAGNOSIS — Z9889 Other specified postprocedural states: Secondary | ICD-10-CM | POA: Diagnosis not present

## 2015-09-29 DIAGNOSIS — I340 Nonrheumatic mitral (valve) insufficiency: Secondary | ICD-10-CM

## 2015-09-29 DIAGNOSIS — J90 Pleural effusion, not elsewhere classified: Secondary | ICD-10-CM

## 2015-09-29 DIAGNOSIS — Q250 Patent ductus arteriosus: Secondary | ICD-10-CM

## 2015-09-29 DIAGNOSIS — I5042 Chronic combined systolic (congestive) and diastolic (congestive) heart failure: Secondary | ICD-10-CM

## 2015-09-29 LAB — POCT INR: INR: 2

## 2015-09-29 NOTE — Patient Instructions (Signed)
Schedule thoracentesis as soon as practical  Continue all previous medications without any changes at this time

## 2015-09-29 NOTE — Progress Notes (Signed)
[address]       [place] [address]             [phone]     CARDIOTHORACIC SURGERY OFFICE NOTE  Referring Provider is Marykay Lex, MD PCP is Darrow Bussing, MD   HPI:  Patient returns to the office today for follow-up status post mitral valve repair, ligation of patent ductus arteriosus, and clipping of left atrial appendage on 07/22/2015 for severe symptomatic mitral regurgitation and patent ductus arteriosus. She originally presented with symptoms of syncope and exertional shortness of breath.  Her early postoperative recovery in the hospital was relatively uncomplicated and she was discharged home on the fifth postoperative day.  She was seen in follow-up on 07/31/2015 at which time she was making steady progress.  On 08/05/2015 she suffered a cardiac arrest at home and was resuscitated by her daughter and EMS. She had ventricular tachycardia upon arrival in the emergency department and was cardioverted. She was treated with hypothermia and recovered without any residual cognitive deficits. She remained in the hospital for an extended period of time. Cardiac MRI revealed ejection fraction 45% with hypokinesis of the distal septum and apex. There was diffuse delayed enhancement in the subendocardium concerning for possible myocarditis or infiltrative cardiomyopathy. EKG revealed QT prolongation and she eventually underwent ICD placement for secondary prevention. She also had problems with severe orthostatic hypotension associated with recurrent syncopal episodes. She was treated with high doses of florinef and midodrine and slowly improved.  During her hospitalization she underwent left thoracentesis for a large symptomatic left pleural effusion. Since hospital discharge she has been seen in follow-up on 2 occasions at [facility], Most recently on 09/16/2015 by Dr. Duke Salvia.  She was referred for ENT evaluation because of persistent hoarseness of voice and was  evaluated recently by Dr. Lazarus Salines who confirmed the presence of paralyzed left vocal cord. Surgical intervention has not yet been scheduled. The patient returns to our office today stating that overall she has been slowly improving. However, over the last several days she states she has had increased shortness of breath. She is concerned that the fluid may be reaccumulating around her right lung. She has not had fevers or chills. She denies productive cough. Her voice remains very hoarse and weak. She denies any difficulty with swallowing or tendency to cough when she eats or drinks.  She has not had any further dizzy spells or syncopal episodes.  Current Outpatient Prescriptions  Medication Sig Dispense Refill  . buPROPion (WELLBUTRIN) 75 MG tablet Take 0.5 tablets (37.5 mg total) by mouth 2 (two) times daily. 60 tablet 0  . ENSURE (ENSURE) Take 237 mLs by mouth once.    . midodrine (PROAMATINE) 10 MG tablet Take 1 tablet (10 mg total) by mouth 3 (three) times daily. 90 tablet 0  . pantoprazole (PROTONIX) 40 MG tablet Take 1 tablet (40 mg total) by mouth daily at 12 noon. 30 tablet 0  . warfarin (COUMADIN) 1 MG tablet Take 2 tablets (2 mg total) by mouth daily at 6 PM. Start 2/11-HOLD dose 2/10 30 tablet 0   No current facility-administered medications for this visit.      Physical Exam:   BP 120/71 mmHg  Pulse 94  Resp 18  Ht 5\' 2"  (1.575 m)  Wt 105 lb (47.628 kg)  BMI 19.20 kg/m2  SpO2 97%  General:  Thin but well-appearing  Chest:   Diminished breath sounds left base  CV:  Regular rate and rhythm without murmur  Incisions:  Completely healed, sternum is stable  Abdomen:  Soft nontender  Extremities:  Warm and well perfused with no lower extremity edema  Diagnostic Tests:  Transthoracic Echocardiography  Patient: Adesire, Sooter MR #: [id number] Study Date: 09/04/2015 Gender: F Age: 55 Height: 157.5 cm Weight: 47.6 kg BSA: 1.44 m^2 Pt.  Status: Room: 2C17C  Abagail Kitchens ATTENDING Gilda Crease ADMITTING [place] SONOGRAPHER Delcie Roch, RDCS, CCT PERFORMING [facility], Inpatient  cc:  ------------------------------------------------------------------- LV EF: 35% - 40%  ------------------------------------------------------------------- History: PMH: Status post Mitral valve repair. Atrial fibrillation. Risk factors: PDA ligation. ICD in place. Acute pulmonary edema.  ------------------------------------------------------------------- Study Conclusions  - Left ventricle: The cavity size was normal. There was mild  concentric hypertrophy. Systolic function was moderately reduced.  The estimated ejection fraction was in the range of 35% to 40%.  Diffuse hypokinesis. - Aortic valve: Trileaflet; mildly thickened, mildly calcified  leaflets. There was trivial regurgitation. - Mitral valve: S/P mitral valve repair with annuloplasty ring. The  findings are consistent with mild stenosis. Valve area by  continuity equation (using LVOT flow): 1.34 cm^2. - Tricuspid valve: There was mild regurgitation. - Pulmonary arteries: PA peak pressure: 39 mm Hg (S). - Pericardium, extracardiac: There was a left pleural effusion.  Impressions:  - The right ventricular systolic pressure was increased consistent  with mild pulmonary hypertension.  Transthoracic echocardiography. M-mode, complete 2D, spectral Doppler, and color Doppler. Birthdate: Patient birthdate: [birth year]. Age: Patient is 55 yr old. Sex: Gender: female. BMI: 19.2 kg/m^2. Blood pressure: 122/74 Patient status: Inpatient. Study date: Study date: 09/04/2015. Study time: 09:01 AM. Location: ICU/CCU  -------------------------------------------------------------------  ------------------------------------------------------------------- Left ventricle: The cavity  size was normal. There was mild concentric hypertrophy. Systolic function was moderately reduced. The estimated ejection fraction was in the range of 35% to 40%. Diffuse hypokinesis.  ------------------------------------------------------------------- Aortic valve: Trileaflet; mildly thickened, mildly calcified leaflets. Mobility was not restricted. Doppler: Transvalvular velocity was within the normal range. There was no stenosis. There was trivial regurgitation.  ------------------------------------------------------------------- Aorta: Aortic root: The aortic root was normal in size.  ------------------------------------------------------------------- Mitral valve: S/P mitral valve repair with annuloplasty ring. Structurally normal valve. Mobility was not restricted. Doppler:  The findings are consistent with mild stenosis. There was trivial regurgitation. Valve area by continuity equation (using LVOT flow): 1.34 cm^2. Indexed valve area by continuity equation (using LVOT flow): 0.93 cm^2/m^2. Mean gradient (D): 5 mm Hg. Peak gradient (D): 9 mm Hg.  ------------------------------------------------------------------- Left atrium: The atrium was normal in size.  ------------------------------------------------------------------- Right ventricle: The cavity size was normal. Wall thickness was normal. Pacer wire or catheter noted in right ventricle. Systolic function was normal.  ------------------------------------------------------------------- Pulmonic valve: Structurally normal valve. Cusp separation was normal. Doppler: Transvalvular velocity was within the normal range. There was no evidence for stenosis. There was no regurgitation.  ------------------------------------------------------------------- Tricuspid valve: Structurally normal valve. Doppler: Transvalvular velocity was within the normal range. There was  mild regurgitation.  ------------------------------------------------------------------- Pulmonary artery: The main pulmonary artery was normal-sized. Systolic pressure was within the normal range.  ------------------------------------------------------------------- Right atrium: The atrium was normal in size. Pacer wire or catheter noted in right atrium.  ------------------------------------------------------------------- Pericardium: There was no pericardial effusion.  ------------------------------------------------------------------- Systemic veins: Inferior vena cava: The vessel was normal in size.  ------------------------------------------------------------------- Pleura: There was a left pleural effusion.  ------------------------------------------------------------------- Measurements  Left ventricle Value Reference LV ID, ED, PLAX chordal (L) 37.1 mm 43 - 52 LV ID, ES, PLAX  chordal 31 mm 23 - 38 LV fx shortening, PLAX chordal (L) 16 % >=29 LV PW thickness, ED 13.5 mm --------- IVS/LV PW ratio, ED 0.81 <=1.3 Stroke volume, 2D 35 ml --------- Stroke volume/bsa, 2D 24 ml/m^2 --------- LV ejection fraction, 1-p A4C 36 % --------- LV end-diastolic volume, 2-p 66 ml --------- LV end-systolic volume, 2-p 42 ml --------- LV ejection fraction, 2-p 37 % --------- Stroke volume, 2-p 25 ml --------- LV end-diastolic volume/bsa, 2-p 46 ml/m^2 --------- LV end-systolic volume/bsa, 2-p 29 ml/m^2 --------- Stroke volume/bsa, 2-p 17.2 ml/m^2 ---------  Ventricular  septum Value Reference IVS thickness, ED 11 mm ---------  LVOT Value Reference LVOT ID, S 17 mm --------- LVOT area 2.27 cm^2 --------- LVOT peak velocity, S 93 cm/s --------- LVOT mean velocity, S 65.5 cm/s --------- LVOT VTI, S 15.5 cm ---------  Aorta Value Reference Aortic root ID, ED 28 mm ---------  Left atrium Value Reference LA ID, A-P, ES 35 mm --------- LA ID/bsa, A-P (H) 2.43 cm/m^2 <=2.2 LA volume, S 42.9 ml --------- LA volume/bsa, S 29.8 ml/m^2 --------- LA volume, ES, 1-p A4C 39.8 ml --------- LA volume/bsa, ES, 1-p A4C 27.6 ml/m^2 --------- LA volume, ES, 1-p A2C 46.5 ml --------- LA volume/bsa, ES, 1-p A2C 32.3 ml/m^2 ---------  Mitral valve Value Reference Mitral E-wave peak velocity 146 cm/s --------- Mitral A-wave peak velocity 109 cm/s --------- Mitral mean velocity, D 95.6 cm/s --------- Mitral mean gradient, D 5 mm Hg --------- Mitral peak gradient, D 9 mm Hg --------- Mitral E/A ratio, peak 1.3 --------- Mitral valve area, LVOT 1.34 cm^2 --------- continuity Mitral valve area/bsa, LVOT 0.93 cm^2/m^2  --------- continuity Mitral annulus VTI, D 26.2 cm ---------  Pulmonary arteries Value Reference PA pressure, S, DP (H) 39 mm Hg <=30  Tricuspid valve Value Reference Tricuspid regurg peak velocity 299 cm/s --------- Tricuspid peak RV-RA gradient 36 mm Hg ---------  Systemic veins Value Reference Estimated CVP 3 mm Hg ---------  Right ventricle Value Reference TAPSE 14.3 mm --------- RV pressure, S, DP (H) 39 mm Hg <=30 RV s&', lateral, S 8.7 cm/s ---------  Legend: (L) and (H) mark values outside specified reference range.  ------------------------------------------------------------------- Prepared and Electronically Authenticated by  Armanda Magic, MD 2017-02-16T09:51:58        CHEST 2 VIEW  COMPARISON: September 11, 2015  FINDINGS: There is a persistent left pleural effusion with left base atelectatic change. Right lung is clear. Heart is upper normal in size with pulmonary vascularity within normal limits. There is a pacemaker with lead tips attached to the right atrium and right ventricle. There is a mitral valve replacement present as well as a left atrial appendage clamp. No adenopathy. No bone lesions. No pneumothorax.  IMPRESSION: Persistent left pleural effusion with left base atelectasis. Right lung clear. No change in cardiac silhouette. No pneumothorax.   Electronically Signed  By: Bretta Bang III M.D.  On: 09/29/2015 10:41    Impression:  Patient has partial reaccumulation of moderate to large size left pleural effusion. This is associated with  worsening exertional shortness of breath.  She seems to be doing well from a cardiac standpoint. Follow-up echocardiogram looks good. She has not had any further syncopal episodes. 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 She will need vocal cord medialization performed.    Plan:  I discussed options regarding management of the patient's left pleural effusion with the patient in the office today.  We discussed proceeding with repeat thoracentesis versus definitive management using Pleurx catheter placement or VATS.  The patient hopes to avoid any significant surgical intervention if possible.  We will arrange for an ultrasound-guided left thoracentesis for symptomatic improvement.  If her left pleural effusion reaccumulated's again after repeat thoracentesis I would favor Pleurx catheter placement. The patient will return to our office in 4 weeks with repeat chest x-ray.   Salvatore Decent. Cornelius Moras, MD 09/29/2015 2:26 PM

## 2015-10-01 ENCOUNTER — Ambulatory Visit (HOSPITAL_COMMUNITY)
Admission: RE | Admit: 2015-10-01 | Discharge: 2015-10-01 | Disposition: A | Discharge status: Home / Self Care | Payer: BLUE CROSS/BLUE SHIELD | Source: Ambulatory Visit | Attending: Physician Assistant | Admitting: Physician Assistant

## 2015-10-01 ENCOUNTER — Other Ambulatory Visit: Payer: Self-pay

## 2015-10-01 ENCOUNTER — Ambulatory Visit (HOSPITAL_COMMUNITY)
Admission: RE | Admit: 2015-10-01 | Discharge: 2015-10-01 | Disposition: A | Discharge status: Home / Self Care | Payer: BLUE CROSS/BLUE SHIELD | Source: Ambulatory Visit | Attending: Thoracic Surgery (Cardiothoracic Vascular Surgery) | Admitting: Thoracic Surgery (Cardiothoracic Vascular Surgery)

## 2015-10-01 DIAGNOSIS — J948 Other specified pleural conditions: Secondary | ICD-10-CM | POA: Insufficient documentation

## 2015-10-01 DIAGNOSIS — J9811 Atelectasis: Secondary | ICD-10-CM | POA: Insufficient documentation

## 2015-10-01 DIAGNOSIS — J90 Pleural effusion, not elsewhere classified: Secondary | ICD-10-CM

## 2015-10-01 MED ORDER — PANTOPRAZOLE SODIUM 40 MG PO TBEC: 40.0000 mg | DELAYED_RELEASE_TABLET | Freq: Every day | ORAL | Status: DC

## 2015-10-01 MED ORDER — MIDODRINE HCL 10 MG PO TABS: 10.0000 mg | ORAL_TABLET | Freq: Three times a day (TID) | ORAL | Status: DC

## 2015-10-01 NOTE — Procedures (Signed)
Successful US guided left thoracentesis. Yielded 800 mls of blood tinged fluid. Pt tolerated procedure well. No immediate complications.  Specimen was not sent for labs. CXR ordered.  Cordie Beazley S Terral Cooks PA-C 10/01/2015 1:00 PM

## 2015-10-07 ENCOUNTER — Telehealth: Payer: Self-pay | Admitting: Cardiovascular Disease

## 2015-10-07 NOTE — Telephone Encounter (Signed)
Spoke with around 4:15 pm patient and her weight on 3/17 105 lb, 3/18 106 lb, 3/20 107 lb, and today 109 lb She is having some increase shortness of breath  Patient has Lasix 20 mg to use as needed Advised patient to take one and I would call her back tomorrow to follow up

## 2015-10-07 NOTE — Telephone Encounter (Signed)
Pt called in stating she has noticed some bloating in her torso and face and she would like to know if she can take some Lasix to help with this. Please f/u with the pt  Thanks

## 2015-10-10 NOTE — Telephone Encounter (Signed)
Tried calling patient to follow up, no vm set up

## 2015-10-10 NOTE — Telephone Encounter (Signed)
Spoke with patient and her weight was down about 4 pounds, was feeling better Patient will keep her ov with Dr Jones Broom Monday as scheduled

## 2015-10-13 ENCOUNTER — Ambulatory Visit (HOSPITAL_COMMUNITY)
Admission: RE | Admit: 2015-10-13 | Discharge: 2015-10-13 | Disposition: A | Discharge status: Home / Self Care | Payer: BLUE CROSS/BLUE SHIELD | Source: Ambulatory Visit | Attending: Internal Medicine | Admitting: Internal Medicine

## 2015-10-13 ENCOUNTER — Encounter (HOSPITAL_COMMUNITY): Payer: Self-pay | Admitting: Internal Medicine

## 2015-10-13 VITALS — BP 112/74 | HR 84 | Wt 109.0 lb

## 2015-10-13 DIAGNOSIS — J3800 Paralysis of vocal cords and larynx, unspecified: Secondary | ICD-10-CM | POA: Diagnosis not present

## 2015-10-13 DIAGNOSIS — I5043 Acute on chronic combined systolic (congestive) and diastolic (congestive) heart failure: Secondary | ICD-10-CM | POA: Insufficient documentation

## 2015-10-13 DIAGNOSIS — Z79899 Other long term (current) drug therapy: Secondary | ICD-10-CM | POA: Diagnosis not present

## 2015-10-13 DIAGNOSIS — R0602 Shortness of breath: Secondary | ICD-10-CM | POA: Diagnosis not present

## 2015-10-13 DIAGNOSIS — Z885 Allergy status to narcotic agent status: Secondary | ICD-10-CM | POA: Diagnosis not present

## 2015-10-13 DIAGNOSIS — Z8249 Family history of ischemic heart disease and other diseases of the circulatory system: Secondary | ICD-10-CM | POA: Diagnosis not present

## 2015-10-13 DIAGNOSIS — J90 Pleural effusion, not elsewhere classified: Secondary | ICD-10-CM

## 2015-10-13 DIAGNOSIS — Z8674 Personal history of sudden cardiac arrest: Secondary | ICD-10-CM | POA: Diagnosis not present

## 2015-10-13 DIAGNOSIS — I480 Paroxysmal atrial fibrillation: Secondary | ICD-10-CM | POA: Insufficient documentation

## 2015-10-13 DIAGNOSIS — I5042 Chronic combined systolic (congestive) and diastolic (congestive) heart failure: Secondary | ICD-10-CM

## 2015-10-13 DIAGNOSIS — Z95810 Presence of automatic (implantable) cardiac defibrillator: Secondary | ICD-10-CM | POA: Diagnosis not present

## 2015-10-13 DIAGNOSIS — Z7901 Long term (current) use of anticoagulants: Secondary | ICD-10-CM | POA: Diagnosis not present

## 2015-10-13 DIAGNOSIS — I4891 Unspecified atrial fibrillation: Secondary | ICD-10-CM | POA: Diagnosis not present

## 2015-10-13 LAB — PROTIME-INR
INR: 1.45 (ref 0.00–1.49)
PROTHROMBIN TIME: 17.7 s — AB (ref 11.6–15.2)

## 2015-10-13 LAB — FERRITIN: Ferritin: 64 ng/mL (ref 11–307)

## 2015-10-13 LAB — CBC
HCT: 38.2 % (ref 36.0–46.0)
HEMOGLOBIN: 12 g/dL (ref 12.0–15.0)
MCH: 28.9 pg (ref 26.0–34.0)
MCHC: 31.4 g/dL (ref 30.0–36.0)
MCV: 92 fL (ref 78.0–100.0)
Platelets: 242 10*3/uL (ref 150–400)
RBC: 4.15 MIL/uL (ref 3.87–5.11)
RDW: 14.1 % (ref 11.5–15.5)
WBC: 5.8 10*3/uL (ref 4.0–10.5)

## 2015-10-13 LAB — BASIC METABOLIC PANEL
ANION GAP: 9 (ref 5–15)
BUN: 9 mg/dL (ref 6–20)
CALCIUM: 9.6 mg/dL (ref 8.9–10.3)
CO2: 25 mmol/L (ref 22–32)
Chloride: 103 mmol/L (ref 101–111)
Creatinine, Ser: 0.6 mg/dL (ref 0.44–1.00)
GFR calc Af Amer: >60 mL/min (ref >60)
GFR calc non Af Amer: >60 mL/min (ref >60)
GLUCOSE: 85 mg/dL (ref 65–99)
Potassium: 4.1 mmol/L (ref 3.5–5.1)
Sodium: 137 mmol/L (ref 135–145)

## 2015-10-13 LAB — BRAIN NATRIURETIC PEPTIDE: B Natriuretic Peptide: 438.6 pg/mL — ABNORMAL HIGH (ref 0.0–100.0)

## 2015-10-13 MED ORDER — POTASSIUM CHLORIDE CRYS ER 20 MEQ PO TBCR
20.0000 meq | EXTENDED_RELEASE_TABLET | ORAL | Status: DC
Start: 2015-10-13 — End: 2015-10-20

## 2015-10-13 MED ORDER — FUROSEMIDE 20 MG PO TABS: 20.0000 mg | ORAL_TABLET | ORAL | Status: DC

## 2015-10-13 NOTE — Progress Notes (Signed)
Advanced Heart Failure Medication Review by a Pharmacist  Does the patient  feel that his/her medications are working for him/her?  yes  Has the patient been experiencing any side effects to the medications prescribed?  no  Does the patient measure his/her own blood pressure or blood glucose at home?  yes   Does the patient have any problems obtaining medications due to transportation or finances?   no  Understanding of regimen: good Understanding of indications: good Potential of compliance: good Patient understands to avoid NSAIDs. Patient understands to avoid decongestants.  Issues to address at subsequent visits: None   Pharmacist comments:  Diane [name] is a pleasant [name] presenting with her husband and a current medication list. They have a daughter who is a Teacher, early years/pre at [employer]. They report excellent compliance with her regimen and did not have any specific medication-related questions or concerns for me at this time.   Tyler Deis. Bonnye Fava, PharmD, BCPS, CPP Clinical Pharmacist Pager: [phone] Phone: [phone] 10/13/2015 12:06 PM      Time with patient: 8 minutes Preparation and documentation time: 2 minutes Total time: 10 minutes

## 2015-10-13 NOTE — Patient Instructions (Signed)
Start Furosemide (Lasix) 20 mg every other day   Start Potassium 20 meq every other day   Labs today  Chest X-Ray today  Your physician recommends that you schedule a follow-up appointment in: Friday

## 2015-10-13 NOTE — Progress Notes (Signed)
[name], female   DOB: [birth year], 55 y.o.   MRN: [id number]    Cardiology Office Note   Date:  10/13/2015   ID:  [name], DOB [birth year], MRN [id number]  PCP:  Darrow Bussing, MD  Cardiologist:   Dr. Duke Salvia Electrophysiologist: Dr. Berton Mount   [name] is a 55 y.o. female who is referred to the HF Clinic by Dr. 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  Says her symptoms started about 2 years ago with syncope and DOE. Finally referred to Dr. Duke Salvia in 12/16. Echocardiography revealed severe mitral regurgitation and a reduced LVEF (45-50%).  The MR was due to tethering of the posterior leaflet.  She was also noted to have grade 3 diastolic dysfunction with mid basal to mid inferolateral wall hypokinesis.  On 07/22/15 underwent repair of her mitral valve with a 26 mm Sorin Memo 3D annuloplasty ring on 07/22/15 by Dr. Cornelius Moras.  Intra-operatively she was noted to have a PDA with left to right shunting that was ligated in the OR. The left atrium was also clipped.   Her post operative course was initially unremarkable and she was discharged on post-op day 5.    On 08/05/15 she suffered a cardiac arrest at home and was resuscitated by EMS.  The initial rhythm was unclear but she had VT upon arrival in the ED, which was followed by atrial fibrillation.  She was treated with hypothermia and had prolonged hospital course. Fortunately she has no residual cognitive deficits.  During that hospitalization she underwent cardiac MRI with LVEF 45% with hypokinesis of the distal septum and apex.  There was diffuse, delayed enhancement in the subendocardium and mid epicardial region, most prominent in the inferior wall.  This was concerning for myocarditis or infiltrative cardiomyopathy.  Her EKG showed QT prolongation and she had an ICD placed for secondary prevention.  Her hospitalization was delayed by  severe orthostatic hypotension and recurrent syncope requiring high doses of florinef and midodrine which were weaned down.  She was discharged home but was readmitted with volume overload on 2/16.  Echo showed LVEF 35-40% with diffuse hypokinesis and her mitral valve was functioning well.  She also underwent L thoracentesis with removal of 1.4L and received IV lasix.  She was discharged home on 2/16 and followed up in clinic on 2/23.  At that time she reported some dizziness and was hypotensive to 84/58.   She saw Dr. Cornelius Moras 2 weeks ago for post-op f/u. Had reaccumulation of left pleural effusion. Discussed Pleurex or VATs vs thoracentesis. She chose thoracentesis and got 800 cc off. Also has vocal cord paralysis. Has seen ENT and considering vocal cord medialization at some point.   [name] continues to feel poorly. She gained 5 pounds last week and called the office and took lasix  for one day. Had good urine output but ahs not taken any more lasix. Has severe cough which is getting worse. Not bringing anything up. + Orthopnea or PND. Lower extremity edema improved with lasix.  SBP ~110 with midodrine     Past Medical History  Diagnosis Date  . Severe mitral regurgitation 07/03/2015  . Chronic combined systolic and diastolic CHF (congestive heart failure) (HCC) 07/03/2015    Grade 3 diastolic dysfunction.  LVEF 40-45% 05/2015.  Marland Kitchen Aortic aneurysm (HCC)   . Patent ductus arteriosus 07/16/2015  . Depression     SOME DEPRESSION (ON MEDS FOR 2 YRS)  WHEN HER MOTHER PASSED  . 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 07/22/2015    26 mm Sorin Memo 3D ring annuloplasty with ligation of patent ductus arteriosus and clipping of LA appendage    Past Surgical History  Procedure Laterality Date  . Ovarian cyst removal    . Tee without cardioversion N/A 07/07/2015    Procedure: TRANSESOPHAGEAL ECHOCARDIOGRAM (TEE);  Surgeon: Chilton Si, MD;  Location: [facility] ENDOSCOPY;   Service: Cardiovascular;  Laterality: N/A;  . Cardiac catheterization N/A 07/08/2015    Procedure: Right/Left Heart Cath and Coronary Angiography;  Surgeon: Marykay Lex, MD;  Location: [facility] INVASIVE CV LAB;  Service: Cardiovascular;  Laterality: N/A;  . Mitral valve repair N/A 07/22/2015    Procedure: MITRAL VALVE REPAIR;  Surgeon: Purcell Nails, MD;  Location: [facility] OR;  Service: Open Heart Surgery;  Laterality: N/A;  26 Sorin Memo 3D  . Tee without cardioversion N/A 07/22/2015    Procedure: TRANSESOPHAGEAL ECHOCARDIOGRAM (TEE);  Surgeon: Purcell Nails, MD;  Location: [facility] OR;  Service: Open Heart Surgery;  Laterality: N/A;  . Patent ductus arterious repair N/A 07/22/2015    Procedure: PATENT DUCTUS ARTERIOSUS (PDA) ligation;  Surgeon: Purcell Nails, MD;  Location: [facility] OR;  Service: Open Heart Surgery;  Laterality: N/A;  . Clipping of atrial appendage  07/22/2015    Procedure: CLIPPING OF LEFT ATRIAL APPENDAGE;  Surgeon: Purcell Nails, MD;  Location: [facility] OR;  Service: Open Heart Surgery;;  35 Atriclip Pro  . Ep implantable device N/A 08/15/2015    Procedure: ICD Implant;  Surgeon: Will Jorja Loa, MD;  Location: [facility] INVASIVE CV LAB;  Service: Cardiovascular;  Laterality: N/A;     Current Outpatient Prescriptions  Medication Sig Dispense Refill  . buPROPion (WELLBUTRIN) 75 MG tablet Take 0.5 tablets (37.5 mg total) by mouth 2 (two) times daily. 60 tablet 0  . ENSURE (ENSURE) Take 237 mLs by mouth daily as needed (appetite).     . midodrine (PROAMATINE) 10 MG tablet Take 1 tablet (10 mg total) by mouth 3 (three) times daily. 30 tablet 5  . warfarin (COUMADIN) 1 MG tablet Take 2-3 mg by mouth daily. Take 2 mg daily except 3 mg on Monday and Friday    . furosemide (LASIX) 20 MG tablet Take 1 tablet (20 mg total) by mouth every other day. 15 tablet 3  . potassium chloride SA (K-DUR,KLOR-CON) 20 MEQ tablet Take 1 tablet (20 mEq total) by mouth every other day. 15 tablet 3   No current  facility-administered medications for this encounter.    Allergies:   Vicodin; Adhesive; and Silicone    Social History:  The patient  reports that she has never smoked. She has never used smokeless tobacco. She reports that she does not drink alcohol or use illicit drugs.   Family History:  The patient's family history includes Hypertension in her mother.    ROS:  Please see the history of present illness.  Otherwise, review of systems are positive for none.   All other systems are reviewed and negative.    PHYSICAL EXAM: VS:  BP 112/74 mmHg  Pulse 84  Wt 109 lb (49.442 kg)  SpO2 100% , BMI Body mass index is 19.93 kg/(m^2). GENERAL:  Weak appearing  Mild SOB HEENT:  Pupils equal round and reactive, fundi not visualized, oral mucosa unremarkable.  Hoarse voice NECK:  JVP 6-7 LYMPHATICS:  No cervical adenopathy LUNGS:  Diminished breath 1/2 up on L HEART:  RRR.  PMI not displaced or sustained,S1 and S2 within normal limits, no S3, no S4, no clicks, no rubs, no murmurs ABD:  Flat, positive bowel sounds normal in frequency in pitch, no bruits, no rebound, no guarding, no midline pulsatile mass, no hepatomegaly, no splenomegaly EXT:  2 plus pulses throughout, trace edema, no cyanosis no clubbing SKIN:  No rashes no nodules NEURO:  Cranial nerves II through XII grossly intact, motor grossly intact throughout PSYCH:  Cognitively intact, oriented to person place and time   EKG:  EKG is not ordered today.  Echo 06/04/15: Study Conclusions  - Left ventricle: The cavity size was normal. Wall thickness was increased in a pattern of mild LVH. Systolic function was mildly reduced. The estimated ejection fraction was in the range of 45% to 50%. Abnormal GLPSS at -11%, with inferior and inferolateral strain abnormality. Basal to mid inferolateral wall hypokinesis. Doppler parameters are consistent with restrictive left ventricular relaxation (grade 3 diastolic dysfunction).  The E/A ratio is >2.5. The E/e&' ratio is >20, suggesting markedly elevated LV filling pressure. - Mitral valve: Mildly thickened leaflets with tethering of the posterior leaflet. There is severe, posteriorly directed regurgitation which hugs the atrial free wall. Reversal of flow is seen in the pulmonary vein. - Left atrium: Severely dilated at 52 ml/m2. - Right atrium: The atrium was mildly dilated. - Tricuspid valve: There was mild regurgitation. - Pulmonary arteries: PA peak pressure: 41 mm Hg (S). - Inferior vena cava: The vessel was normal in size. The respirophasic diameter changes were in the normal range (>= 50%), consistent with normal central venous pressure. - Pericardium, extracardiac: A trivial pericardial effusion was identified posterior to the heart. Features were not consistent with tamponade physiology.  Impressions:  - LVEF 45-50%, mild LVH, abnormal GLPSS at -11% with inferolateral strain and wall motion abnormalities. There is tethering of the posterior mitral leaflet and severe posteriorly directed mitral regurgitation. Restrictive diastolic dysfunction with likely elevated LV filling pressures. Severely dilated LA at 52 ml/m2, mild TR, RVSP elevated at 41 mmHg (consistent with mild pulmonary venous hypertension), trivial posterior pericardial effusion without tamponade physiology.  PFT 11/05/14:  FEV1  1.9 L FVC : 2.4 L FEV1 and FVC are reduced, but the FEV1/FVC ratio is normal.  Following adminitration of bronchodilator there was no significant response.  Consistent with minimal obstructive airway disease.    LHC 07/08/15:  Severe mitral regurgitation with large V waves and 4+ MR  There is mild to moderate left ventricular systolic dysfunction. With cardiac output/index moderate-severely reduced.  Angiographically normal coronary arteries  Cardiac MRI 08/14/15: IMPRESSION: 1) Mildly asymmetric septal hypertrophy 15 mm.  Distal septal apical hypokinesis EF 45%  2) Diffuse delayed gadolinium uptake in the subendocardial and mid epicardial areas especially marked in the inferior wall  3) S/P mitral valve repair with no residual MR and annuloplasty ring  4) Atri-Clip of the LAA with no flow  5) No residual PDA flow  6) Mild LAE  7) Small pericardial effusion  8) Large left pleural effusion  Note delayed gadolinium findings may be consistent with myocarditis vs infiltrative cardiomyopathy   Echo 09/04/15: Study Conclusions  - Left ventricle: The cavity size was normal. There was mild concentric hypertrophy. Systolic function was moderately reduced. The estimated ejection fraction was in the range of 35% to 40%. Diffuse hypokinesis. - Aortic valve: Trileaflet; mildly thickened, mildly calcified leaflets. There was trivial regurgitation. - Mitral valve: S/P mitral valve repair with annuloplasty ring. The findings are  consistent with mild stenosis. Valve area by continuity equation (using LVOT flow): 1.34 cm^2. - Tricuspid valve: There was mild regurgitation. - Pulmonary arteries: PA peak pressure: 39 mm Hg (S). - Pericardium, extracardiac: There was a left pleural effusion.  Impressions:  - The right ventricular systolic pressure was increased consistent with mild pulmonary hypertension.   Recent Labs: 08/05/2015: TSH 2.455 09/03/2015: Magnesium 1.9 09/04/2015: ALT 19 10/13/2015: B Natriuretic Peptide 438.6*; BUN 9; Creatinine, Ser 0.60; Hemoglobin 12.0; Platelets 242; Potassium 4.1; Sodium 137    Lipid Panel    Component Value Date/Time   CHOL 181 09/04/2015 0000      Wt Readings from Last 3 Encounters:  10/13/15 109 lb (49.442 kg)  09/29/15 105 lb (47.628 kg)  09/16/15 106 lb 6.4 oz (48.263 kg)      ASSESSMENT AND PLAN:  1) Acute on  Chronic systolic and diastolic heart failure: --EF 35-40% on echo; 40% on cMRI. S/p ICD for SCD --NYHA IIIb with mild  volume overload on exam and evidence of recurrent left effusion  --Will get CXR and also start lasix 20mg  qod with KCL 20 --MRI reviewed and is suggestive of possible infiltrative process or myocarditis. I actually favor myocarditis. SPEP/UPEP negative as well as serologies for CTD. Check ferritin.  I will review with Drs. Delton See and Amasa. Unfortunately cannot repeat MRI due to ICD. Eventually may need to consider EM biopsy but I do not think this is emergent. --She continues to require midodrine for BP support, so no beta blocker or ACE-I/ARB at this time.  --I will see back later this week for f/u  2) Recurrent left pleural effusion --she has evidence or a recurrent L pleural effusion on exam and by CXR. She will need Pleurex or VATS. I will discuss with Dr. Cornelius Moras. Given her respiratory difficulties this will need to be done soon. Hopefully she may get some improvement with diuresis.    3) Mitral regurgitation s/p mitral valve repair:  Stable s/p MV repair  4) Paroxysmal atrial fibrillation: Remains in sinus rhythm.  LA appendage was clipped in surgery.  There was no flow in the LAA on cardiac MRI.  Therefore, it is reasonable to hold warfarin for any necessary procedures without the need for bridging anticoagulation.  This patients CHA2DS2-VASc Score and unadjusted Ischemic Stroke Rate (% per year) is equal to 2.2 % stroke rate/year from a score of 2  Above score calculated as 1 point each if present [CHF, HTN, DM, Vascular=MI/PAD/Aortic Plaque, Age if 65-74, or Female] Above score calculated as 2 points each if present [Age > 75, or Stroke/TIA/TE]   Total time spent 45 minutes. Over half that time spent discussing above.   Migdalia Dk, MD  10/13/2015 10:27 PM

## 2015-10-17 ENCOUNTER — Ambulatory Visit (HOSPITAL_COMMUNITY)
Admission: RE | Admit: 2015-10-17 | Discharge: 2015-10-17 | Disposition: A | Discharge status: Home / Self Care | Payer: BLUE CROSS/BLUE SHIELD | Source: Ambulatory Visit | Attending: Cardiology | Admitting: Cardiology

## 2015-10-17 ENCOUNTER — Encounter (HOSPITAL_COMMUNITY): Payer: Self-pay

## 2015-10-17 VITALS — BP 95/55 | HR 93 | Ht 62.0 in | Wt 103.0 lb

## 2015-10-17 DIAGNOSIS — Z7901 Long term (current) use of anticoagulants: Secondary | ICD-10-CM | POA: Insufficient documentation

## 2015-10-17 DIAGNOSIS — I5042 Chronic combined systolic (congestive) and diastolic (congestive) heart failure: Secondary | ICD-10-CM

## 2015-10-17 DIAGNOSIS — Z79899 Other long term (current) drug therapy: Secondary | ICD-10-CM | POA: Insufficient documentation

## 2015-10-17 DIAGNOSIS — F329 Major depressive disorder, single episode, unspecified: Secondary | ICD-10-CM | POA: Diagnosis not present

## 2015-10-17 DIAGNOSIS — I5043 Acute on chronic combined systolic (congestive) and diastolic (congestive) heart failure: Secondary | ICD-10-CM | POA: Diagnosis not present

## 2015-10-17 DIAGNOSIS — J90 Pleural effusion, not elsewhere classified: Secondary | ICD-10-CM | POA: Diagnosis present

## 2015-10-17 DIAGNOSIS — I509 Heart failure, unspecified: Secondary | ICD-10-CM

## 2015-10-17 DIAGNOSIS — I480 Paroxysmal atrial fibrillation: Secondary | ICD-10-CM | POA: Diagnosis not present

## 2015-10-17 LAB — BASIC METABOLIC PANEL
ANION GAP: 8 (ref 5–15)
BUN: 14 mg/dL (ref 6–20)
CO2: 25 mmol/L (ref 22–32)
Calcium: 9.6 mg/dL (ref 8.9–10.3)
Chloride: 103 mmol/L (ref 101–111)
Creatinine, Ser: 0.68 mg/dL (ref 0.44–1.00)
GFR calc Af Amer: >60 mL/min (ref >60)
Glucose, Bld: 83 mg/dL (ref 65–99)
POTASSIUM: 4.1 mmol/L (ref 3.5–5.1)
SODIUM: 136 mmol/L (ref 135–145)

## 2015-10-17 NOTE — Progress Notes (Signed)
[name], female   DOB: 05-Jul-1961, 55 y.o.   MRN: [id number]   [facility]INIC NOTE   Date:  10/17/2015   ID:  Diane [name], DOB 11-10-1960, MRN [id number]  PCP:  Darrow Bussing, MD  Cardiologist:   Dr. Duke Salvia Electrophysiologist: Dr. Berton Mount   [name] is a 55 y.o. female who is referred to the HF Clinic by Dr. Duke Salvia for further evaluation of her chronic systolic and diastolic heart failure and possible infiltrative CM.  Says her symptoms started about 2 years ago with syncope and DOE. Finally referred to Dr. Duke Salvia in 12/16. Echocardiography revealed severe mitral regurgitation and a reduced LVEF (45-50%).  The MR was due to tethering of the posterior leaflet.  She was also noted to have grade 3 diastolic dysfunction with mid basal to mid inferolateral wall hypokinesis.  On 07/22/15 underwent repair of her mitral valve with a 26 mm Sorin Memo 3D annuloplasty ring on 07/22/15 by Dr. Cornelius Moras.  Intra-operatively she was noted to have a PDA with left to right shunting that was ligated in the OR. The left atrium was also clipped.   Her post operative course was initially unremarkable and she was discharged on post-op day 5.    On 08/05/15 she suffered a cardiac arrest at home and was resuscitated by EMS.  The initial rhythm was unclear but she had VT upon arrival in the ED, which was followed by atrial fibrillation.  She was treated with hypothermia and had prolonged hospital course. Fortunately she has no residual cognitive deficits.  During that hospitalization she underwent cardiac MRI with LVEF 45% with hypokinesis of the distal septum and apex.  There was diffuse, delayed enhancement in the subendocardium and mid epicardial region, most prominent in the inferior wall.  This was concerning for myocarditis or infiltrative cardiomyopathy.  Her EKG showed QT prolongation and she had an ICD placed for secondary prevention.  Her hospitalization was delayed by  severe orthostatic hypotension and recurrent syncope requiring high doses of florinef and midodrine which were weaned down.  She was discharged home but was readmitted with volume overload on 2/16.  Echo showed LVEF 35-40% with diffuse hypokinesis and her mitral valve was functioning well.  She also underwent L thoracentesis with removal of 1.4L and received IV lasix.  She was discharged home on 2/16 and followed up in clinic on 2/23.  At that time she reported some dizziness and was hypotensive to 84/58.   She saw Dr. Cornelius Moras 2 weeks ago for post-op f/u. Had reaccumulation of left pleural effusion. Discussed Pleurex or VATs vs thoracentesis. She chose thoracentesis and got 800 cc off. Also has vocal cord paralysis. Has seen ENT and considering vocal cord medialization at some point.   She returns for 1 week f/u. We saw her last week and was volume overloaded with recurrent left pleural effusion. We started lasix 20 mg and also referred back to Dr. Cornelius Moras for Pleurex catheter placement. She refused Pleurex.Took lasix Tuesday and Thursday with good output. Lost 6 pounds. Feels weak. Dizzy. Still SOB but improved. No edema.      Past Medical History  Diagnosis Date  . Severe mitral regurgitation 07/03/2015  . Chronic combined systolic and diastolic CHF (congestive heart failure) (HCC) 07/03/2015    Grade 3 diastolic dysfunction.  LVEF 40-45% 05/2015.  Marland Kitchen Aortic aneurysm (HCC)   . Patent ductus arteriosus 07/16/2015  . Depression     SOME DEPRESSION (ON MEDS FOR 2 YRS) WHEN  HER MOTHER PASSED  . 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 07/22/2015    26 mm Sorin Memo 3D ring annuloplasty with ligation of patent ductus arteriosus and clipping of LA appendage    Past Surgical History  Procedure Laterality Date  . Ovarian cyst removal    . Tee without cardioversion N/A 07/07/2015    Procedure: TRANSESOPHAGEAL ECHOCARDIOGRAM (TEE);  Surgeon: Chilton Si, MD;  Location: [facility]  ENDOSCOPY;  Service: Cardiovascular;  Laterality: N/A;  . Cardiac catheterization N/A 07/08/2015    Procedure: Right/Left Heart Cath and Coronary Angiography;  Surgeon: Marykay Lex, MD;  Location: [facility] INVASIVE CV LAB;  Service: Cardiovascular;  Laterality: N/A;  . Mitral valve repair N/A 07/22/2015    Procedure: MITRAL VALVE REPAIR;  Surgeon: Purcell Nails, MD;  Location: [facility] OR;  Service: Open Heart Surgery;  Laterality: N/A;  26 Sorin Memo 3D  . Tee without cardioversion N/A 07/22/2015    Procedure: TRANSESOPHAGEAL ECHOCARDIOGRAM (TEE);  Surgeon: Purcell Nails, MD;  Location: [facility] OR;  Service: Open Heart Surgery;  Laterality: N/A;  . Patent ductus arterious repair N/A 07/22/2015    Procedure: PATENT DUCTUS ARTERIOSUS (PDA) ligation;  Surgeon: Purcell Nails, MD;  Location: [facility] OR;  Service: Open Heart Surgery;  Laterality: N/A;  . Clipping of atrial appendage  07/22/2015    Procedure: CLIPPING OF LEFT ATRIAL APPENDAGE;  Surgeon: Purcell Nails, MD;  Location: [facility] OR;  Service: Open Heart Surgery;;  35 Atriclip Pro  . Ep implantable device N/A 08/15/2015    Procedure: ICD Implant;  Surgeon: Will Jorja Loa, MD;  Location: [facility] INVASIVE CV LAB;  Service: Cardiovascular;  Laterality: N/A;     Current Outpatient Prescriptions  Medication Sig Dispense Refill  . buPROPion (WELLBUTRIN) 75 MG tablet Take 0.5 tablets (37.5 mg total) by mouth 2 (two) times daily. 60 tablet 0  . ENSURE (ENSURE) Take 237 mLs by mouth daily as needed (appetite).     . furosemide (LASIX) 20 MG tablet Take 1 tablet (20 mg total) by mouth every other day. 15 tablet 3  . midodrine (PROAMATINE) 10 MG tablet Take 1 tablet (10 mg total) by mouth 3 (three) times daily. 30 tablet 5  . potassium chloride SA (K-DUR,KLOR-CON) 20 MEQ tablet Take 1 tablet (20 mEq total) by mouth every other day. 15 tablet 3  . warfarin (COUMADIN) 1 MG tablet Take 2-3 mg by mouth daily. Take 2 mg daily except 3 mg on Monday and Friday     No current  facility-administered medications for this encounter.    Allergies:   Vicodin; Adhesive; and Silicone    Social History:  The patient  reports that she has never smoked. She has never used smokeless tobacco. She reports that she does not drink alcohol or use illicit drugs.   Family History:  The patient's family history includes Hypertension in her mother.    ROS:  Please see the history of present illness.  Otherwise, review of systems are positive for none.   All other systems are reviewed and negative.    PHYSICAL EXAM: VS:  BP 95/55 mmHg  Pulse 93  Ht 5\' 2"  (1.575 m)  Wt 103 lb (46.72 kg)  BMI 18.83 kg/m2  SpO2 100% , BMI Body mass index is 18.83 kg/(m^2). GENERAL:  Weak appearing   HEENT:  Pupils equal round and reactive, fundi not visualized, oral mucosa unremarkable.  Hoarse voice NECK:  JVP flat LYMPHATICS:  No cervical adenopathy LUNGS:  Diminished breath sound 1/3 up on left HEART:  RRR.  PMI not displaced or sustained,S1 and S2 within normal limits, no S3, no S4, no clicks, no rubs, no murmurs ABD:  Flat, positive bowel sounds normal in frequency in pitch, no bruits, no rebound, no guarding, no midline pulsatile mass, no hepatomegaly, no splenomegaly EXT:  2 plus pulses throughout, trace edema, no cyanosis no clubbing SKIN:  No rashes no nodules NEURO:  Cranial nerves II through XII grossly intact, motor grossly intact throughout PSYCH:  Cognitively intact, oriented to person place and time   EKG:  EKG is not ordered today.  Echo 06/04/15: Study Conclusions  - Left ventricle: The cavity size was normal. Wall thickness was increased in a pattern of mild LVH. Systolic function was mildly reduced. The estimated ejection fraction was in the range of 45% to 50%. Abnormal GLPSS at -11%, with inferior and inferolateral strain abnormality. Basal to mid inferolateral wall hypokinesis. Doppler parameters are consistent with restrictive left ventricular  relaxation (grade 3 diastolic dysfunction). The E/A ratio is >2.5. The E/e&' ratio is >20, suggesting markedly elevated LV filling pressure. - Mitral valve: Mildly thickened leaflets with tethering of the posterior leaflet. There is severe, posteriorly directed regurgitation which hugs the atrial free wall. Reversal of flow is seen in the pulmonary vein. - Left atrium: Severely dilated at 52 ml/m2. - Right atrium: The atrium was mildly dilated. - Tricuspid valve: There was mild regurgitation. - Pulmonary arteries: PA peak pressure: 41 mm Hg (S). - Inferior vena cava: The vessel was normal in size. The respirophasic diameter changes were in the normal range (>= 50%), consistent with normal central venous pressure. - Pericardium, extracardiac: A trivial pericardial effusion was identified posterior to the heart. Features were not consistent with tamponade physiology.  Impressions:  - LVEF 45-50%, mild LVH, abnormal GLPSS at -11% with inferolateral strain and wall motion abnormalities. There is tethering of the posterior mitral leaflet and severe posteriorly directed mitral regurgitation. Restrictive diastolic dysfunction with likely elevated LV filling pressures. Severely dilated LA at 52 ml/m2, mild TR, RVSP elevated at 41 mmHg (consistent with mild pulmonary venous hypertension), trivial posterior pericardial effusion without tamponade physiology.  PFT 11/05/14:  FEV1  1.9 L FVC : 2.4 L FEV1 and FVC are reduced, but the FEV1/FVC ratio is normal.  Following adminitration of bronchodilator there was no significant response.  Consistent with minimal obstructive airway disease.    LHC 07/08/15:  Severe mitral regurgitation with large V waves and 4+ MR  There is mild to moderate left ventricular systolic dysfunction. With cardiac output/index moderate-severely reduced.  Angiographically normal coronary arteries  Cardiac MRI 08/14/15: IMPRESSION: 1)  Mildly asymmetric septal hypertrophy 15 mm. Distal septal apical hypokinesis EF 45%  2) Diffuse delayed gadolinium uptake in the subendocardial and mid epicardial areas especially marked in the inferior wall  3) S/P mitral valve repair with no residual MR and annuloplasty ring  4) Atri-Clip of the LAA with no flow  5) No residual PDA flow  6) Mild LAE  7) Small pericardial effusion  8) Large left pleural effusion  Note delayed gadolinium findings may be consistent with myocarditis vs infiltrative cardiomyopathy   Echo 09/04/15: Study Conclusions  - Left ventricle: The cavity size was normal. There was mild concentric hypertrophy. Systolic function was moderately reduced. The estimated ejection fraction was in the range of 35% to 40%. Diffuse hypokinesis. - Aortic valve: Trileaflet; mildly thickened, mildly calcified leaflets. There was trivial regurgitation. - Mitral valve: S/P  mitral valve repair with annuloplasty ring. The findings are consistent with mild stenosis. Valve area by continuity equation (using LVOT flow): 1.34 cm^2. - Tricuspid valve: There was mild regurgitation. - Pulmonary arteries: PA peak pressure: 39 mm Hg (S). - Pericardium, extracardiac: There was a left pleural effusion.  Impressions:  - The right ventricular systolic pressure was increased consistent with mild pulmonary hypertension.   Recent Labs: 08/05/2015: TSH 2.455 09/03/2015: Magnesium 1.9 09/04/2015: ALT 19 10/13/2015: B Natriuretic Peptide 438.6*; BUN 9; Creatinine, Ser 0.60; Hemoglobin 12.0; Platelets 242; Potassium 4.1; Sodium 137    Lipid Panel    Component Value Date/Time   CHOL 181 09/04/2015 0000      Wt Readings from Last 3 Encounters:  10/17/15 103 lb (46.72 kg)  10/13/15 109 lb (49.442 kg)  09/29/15 105 lb (47.628 kg)      ASSESSMENT AND PLAN:  1) Acute on  Chronic systolic and diastolic heart failure: --EF 35-40% on echo; 40% on cMRI. S/p  ICD for SCD. NYHA III-IIIb --Volume status improved with lasix. Left effusion improved on exam but still about 1/3 up (confirmed with CXR) --Switch lasix back to prn to maintain weight 105 --MRI reviewed and is suggestive of possible infiltrative process or myocarditis. I actually favor myocarditis. SPEP/UPEP negative as well as serologies for CTD. Check ferritin.  I will review with Drs. Delton See and [place]. Unfortunately cannot repeat MRI due to ICD. Eventually may need to consider EM biopsy but I do not think this is emergent. --She continues to require midodrine for BP support, so no beta blocker or ACE-I/ARB at this time. May try low dose b-block at night at next visit  2) Recurrent left pleural effusion ----Volume status improved with lasix. Left effusion improved on exam but still about 1/3 up (confirmed with CXR). Will refer back to Dr. Cornelius Moras for Pleurex next week. She now agrees.   3) Mitral regurgitation s/p mitral valve repair:  Stable s/p MV repair  4) Paroxysmal atrial fibrillation: Remains in sinus rhythm.  LA appendage was clipped in surgery.  There was no flow in the LAA on cardiac MRI.  Therefore, it is reasonable to hold warfarin for any necessary procedures without the need for bridging anticoagulation.  This patients CHA2DS2-VASc Score and unadjusted Ischemic Stroke Rate (% per year) is equal to 2.2 % stroke rate/year from a score of 2  Above score calculated as 1 point each if present [CHF, HTN, DM, Vascular=MI/PAD/Aortic Plaque, Age if 65-74, or Female] Above score calculated as 2 points each if present [Age > 75, or Stroke/TIA/TE]     Signed, Arvilla Meres, MD  10/17/2015 10:30 AM

## 2015-10-17 NOTE — Patient Instructions (Signed)
Chest Xray today.  Routine lab work today. Will notify you of abnormal results   Follow up in 3 weeks

## 2015-10-20 ENCOUNTER — Other Ambulatory Visit: Payer: Self-pay | Admitting: *Deleted

## 2015-10-20 ENCOUNTER — Ambulatory Visit (INDEPENDENT_AMBULATORY_CARE_PROVIDER_SITE_OTHER): Payer: BLUE CROSS/BLUE SHIELD | Admitting: *Deleted

## 2015-10-20 ENCOUNTER — Encounter (HOSPITAL_COMMUNITY): Payer: Self-pay | Admitting: *Deleted

## 2015-10-20 DIAGNOSIS — J90 Pleural effusion, not elsewhere classified: Secondary | ICD-10-CM

## 2015-10-20 DIAGNOSIS — Z9889 Other specified postprocedural states: Secondary | ICD-10-CM | POA: Diagnosis not present

## 2015-10-20 DIAGNOSIS — Z7901 Long term (current) use of anticoagulants: Secondary | ICD-10-CM | POA: Diagnosis not present

## 2015-10-20 LAB — POCT INR: INR: 1.4

## 2015-10-20 MED ORDER — DEXTROSE 5 % IV SOLN
1.5000 g | INTRAVENOUS | Status: AC
  Administered 2015-10-21: 1.5 g via INTRAVENOUS
  Filled 2015-10-20: qty 1.5

## 2015-10-20 NOTE — Progress Notes (Addendum)
Pt recently had mitral valve repair and repair of patent ductus arteriosis in January. Had a cardiac arrest after discharge home and now has an ICD. Pt on Coumadin, pt was instructed by Dr. Orvan July office not to take it tonight. Pt whispers due to vocal cord paralysis.  Spoke with Dr. Aleene Davidson and made him aware of pt.

## 2015-10-21 ENCOUNTER — Encounter (HOSPITAL_COMMUNITY): Payer: Self-pay | Admitting: Anesthesiology

## 2015-10-21 ENCOUNTER — Ambulatory Visit (HOSPITAL_COMMUNITY): Payer: BLUE CROSS/BLUE SHIELD

## 2015-10-21 ENCOUNTER — Ambulatory Visit (HOSPITAL_COMMUNITY): Payer: BLUE CROSS/BLUE SHIELD | Admitting: Anesthesiology

## 2015-10-21 ENCOUNTER — Ambulatory Visit (HOSPITAL_COMMUNITY)
Admission: RE | Admit: 2015-10-21 | Discharge: 2015-10-21 | Disposition: A | Discharge status: Home / Self Care | Payer: BLUE CROSS/BLUE SHIELD | Source: Ambulatory Visit | Attending: Thoracic Surgery (Cardiothoracic Vascular Surgery) | Admitting: Thoracic Surgery (Cardiothoracic Vascular Surgery)

## 2015-10-21 ENCOUNTER — Other Ambulatory Visit: Payer: Self-pay | Admitting: *Deleted

## 2015-10-21 ENCOUNTER — Encounter (HOSPITAL_COMMUNITY)
Admission: RE | Disposition: A | Discharge status: Home / Self Care | Payer: Self-pay | Source: Ambulatory Visit | Attending: Thoracic Surgery (Cardiothoracic Vascular Surgery)

## 2015-10-21 DIAGNOSIS — R0602 Shortness of breath: Secondary | ICD-10-CM | POA: Diagnosis not present

## 2015-10-21 DIAGNOSIS — J90 Pleural effusion, not elsewhere classified: Secondary | ICD-10-CM

## 2015-10-21 DIAGNOSIS — Z888 Allergy status to other drugs, medicaments and biological substances status: Secondary | ICD-10-CM | POA: Diagnosis not present

## 2015-10-21 DIAGNOSIS — M1990 Unspecified osteoarthritis, unspecified site: Secondary | ICD-10-CM | POA: Diagnosis not present

## 2015-10-21 DIAGNOSIS — Z8674 Personal history of sudden cardiac arrest: Secondary | ICD-10-CM | POA: Diagnosis not present

## 2015-10-21 DIAGNOSIS — Z885 Allergy status to narcotic agent status: Secondary | ICD-10-CM | POA: Insufficient documentation

## 2015-10-21 DIAGNOSIS — Z7901 Long term (current) use of anticoagulants: Secondary | ICD-10-CM | POA: Insufficient documentation

## 2015-10-21 DIAGNOSIS — I5042 Chronic combined systolic (congestive) and diastolic (congestive) heart failure: Secondary | ICD-10-CM | POA: Diagnosis not present

## 2015-10-21 DIAGNOSIS — R490 Dysphonia: Secondary | ICD-10-CM | POA: Insufficient documentation

## 2015-10-21 DIAGNOSIS — Z8249 Family history of ischemic heart disease and other diseases of the circulatory system: Secondary | ICD-10-CM | POA: Insufficient documentation

## 2015-10-21 DIAGNOSIS — Z95810 Presence of automatic (implantable) cardiac defibrillator: Secondary | ICD-10-CM | POA: Diagnosis not present

## 2015-10-21 DIAGNOSIS — G8918 Other acute postprocedural pain: Secondary | ICD-10-CM

## 2015-10-21 HISTORY — DX: Presence of automatic (implantable) cardiac defibrillator: Z95.810

## 2015-10-21 HISTORY — DX: Anemia, unspecified: D64.9

## 2015-10-21 HISTORY — DX: Anxiety disorder, unspecified: F41.9

## 2015-10-21 HISTORY — DX: Reserved for inherently not codable concepts without codable children: IMO0001

## 2015-10-21 HISTORY — PX: CHEST TUBE INSERTION: SHX231

## 2015-10-21 HISTORY — DX: Cardiac arrest, cause unspecified: I46.9

## 2015-10-21 HISTORY — DX: Unspecified osteoarthritis, unspecified site: M19.90

## 2015-10-21 LAB — COMPREHENSIVE METABOLIC PANEL
ALK PHOS: 84 U/L (ref 38–126)
ALT: 19 U/L (ref 14–54)
AST: 36 U/L (ref 15–41)
Albumin: 4.1 g/dL (ref 3.5–5.0)
Anion gap: 9 (ref 5–15)
BILIRUBIN TOTAL: 1.2 mg/dL (ref 0.3–1.2)
BUN: 14 mg/dL (ref 6–20)
CALCIUM: 10.1 mg/dL (ref 8.9–10.3)
CHLORIDE: 105 mmol/L (ref 101–111)
CO2: 24 mmol/L (ref 22–32)
CREATININE: 0.62 mg/dL (ref 0.44–1.00)
Glucose, Bld: 84 mg/dL (ref 65–99)
Potassium: 4.9 mmol/L (ref 3.5–5.1)
Sodium: 138 mmol/L (ref 135–145)
TOTAL PROTEIN: 7.9 g/dL (ref 6.5–8.1)

## 2015-10-21 LAB — PROTIME-INR
INR: 1.31 (ref 0.00–1.49)
PROTHROMBIN TIME: 16.4 s — AB (ref 11.6–15.2)

## 2015-10-21 LAB — CBC
HEMATOCRIT: 39 % (ref 36.0–46.0)
HEMOGLOBIN: 12.5 g/dL (ref 12.0–15.0)
MCH: 28.9 pg (ref 26.0–34.0)
MCHC: 32.1 g/dL (ref 30.0–36.0)
MCV: 90.1 fL (ref 78.0–100.0)
Platelets: 219 10*3/uL (ref 150–400)
RBC: 4.33 MIL/uL (ref 3.87–5.11)
RDW: 14.1 % (ref 11.5–15.5)
WBC: 5.1 10*3/uL (ref 4.0–10.5)

## 2015-10-21 LAB — APTT: aPTT: 29 seconds (ref 24–37)

## 2015-10-21 LAB — SURGICAL PCR SCREEN
MRSA, PCR: NEGATIVE
Staphylococcus aureus: NEGATIVE

## 2015-10-21 SURGERY — INSERTION, PLEURAL DRAINAGE CATHETER
Anesthesia: Monitor Anesthesia Care | Site: Chest | Laterality: Left

## 2015-10-21 MED ORDER — LIDOCAINE HCL (PF) 1 % IJ SOLN
INTRAMUSCULAR | Status: DC | PRN
  Administered 2015-10-21: 9 mL

## 2015-10-21 MED ORDER — ONDANSETRON HCL 4 MG/2ML IJ SOLN: 4.0000 mg | Freq: Four times a day (QID) | INTRAMUSCULAR | Status: DC | PRN

## 2015-10-21 MED ORDER — OXYCODONE HCL 5 MG PO TABS: 5.0000 mg | ORAL_TABLET | ORAL | Status: DC | PRN

## 2015-10-21 MED ORDER — PROPOFOL 500 MG/50ML IV EMUL
INTRAVENOUS | Status: DC | PRN
  Administered 2015-10-21: 50 ug/kg/min via INTRAVENOUS

## 2015-10-21 MED ORDER — FENTANYL CITRATE (PF) 100 MCG/2ML IJ SOLN
INTRAMUSCULAR | Status: DC | PRN
  Administered 2015-10-21 (×2): 50 ug via INTRAVENOUS

## 2015-10-21 MED ORDER — LACTATED RINGERS IV SOLN
INTRAVENOUS | Status: DC
  Administered 2015-10-21 (×2): via INTRAVENOUS

## 2015-10-21 MED ORDER — MIDAZOLAM HCL 5 MG/5ML IJ SOLN
INTRAMUSCULAR | Status: DC | PRN
  Administered 2015-10-21: 1 mg via INTRAVENOUS

## 2015-10-21 MED ORDER — FENTANYL CITRATE (PF) 250 MCG/5ML IJ SOLN
INTRAMUSCULAR | Status: AC
  Filled 2015-10-21: qty 5

## 2015-10-21 MED ORDER — 0.9 % SODIUM CHLORIDE (POUR BTL) OPTIME
TOPICAL | Status: DC | PRN
  Administered 2015-10-21: 1000 mL

## 2015-10-21 MED ORDER — OXYCODONE HCL 5 MG/5ML PO SOLN: 5.0000 mg | Freq: Once | ORAL | Status: DC | PRN

## 2015-10-21 MED ORDER — MORPHINE SULFATE (PF) 2 MG/ML IV SOLN: 1.0000 mg | INTRAVENOUS | Status: DC | PRN

## 2015-10-21 MED ORDER — MUPIROCIN 2 % EX OINT
TOPICAL_OINTMENT | CUTANEOUS | Status: AC
  Administered 2015-10-21: 1
  Filled 2015-10-21: qty 22

## 2015-10-21 MED ORDER — OXYCODONE HCL 5 MG PO TABS
5.0000 mg | ORAL_TABLET | ORAL | Status: DC | PRN
  Administered 2015-10-21: 10 mg via ORAL

## 2015-10-21 MED ORDER — ONDANSETRON HCL 4 MG/2ML IJ SOLN
INTRAMUSCULAR | Status: DC | PRN
  Administered 2015-10-21: 4 mg via INTRAVENOUS

## 2015-10-21 MED ORDER — OXYCODONE HCL 5 MG PO TABS: 5.0000 mg | ORAL_TABLET | Freq: Once | ORAL | Status: DC | PRN

## 2015-10-21 MED ORDER — MIDAZOLAM HCL 2 MG/2ML IJ SOLN
INTRAMUSCULAR | Status: AC
  Filled 2015-10-21: qty 2

## 2015-10-21 MED ORDER — OXYCODONE HCL 5 MG PO TABS
ORAL_TABLET | ORAL | Status: AC
  Filled 2015-10-21: qty 2

## 2015-10-21 MED ORDER — FENTANYL CITRATE (PF) 100 MCG/2ML IJ SOLN: 25.0000 ug | INTRAMUSCULAR | Status: DC | PRN

## 2015-10-21 SURGICAL SUPPLY — 33 items
BRUSH SCRUB EZ PLAIN DRY (MISCELLANEOUS) ×4 IMPLANT
CANISTER SUCTION 2500CC (MISCELLANEOUS) ×2 IMPLANT
COVER SURGICAL LIGHT HANDLE (MISCELLANEOUS) ×2 IMPLANT
DERMABOND ADVANCED (GAUZE/BANDAGES/DRESSINGS) ×1
DERMABOND ADVANCED .7 DNX12 (GAUZE/BANDAGES/DRESSINGS) ×1 IMPLANT
DRAPE C-ARM 42X72 X-RAY (DRAPES) ×2 IMPLANT
DRAPE LAPAROSCOPIC ABDOMINAL (DRAPES) ×2 IMPLANT
DRSG TEGADERM 4X4.75 (GAUZE/BANDAGES/DRESSINGS) ×2 IMPLANT
GLOVE ORTHO TXT STRL SZ7.5 (GLOVE) ×2 IMPLANT
GLOVE SKINSENSE NS SZ6.5 (GLOVE) ×2
GLOVE SKINSENSE NS SZ7.5 (GLOVE) ×1
GLOVE SKINSENSE STRL SZ6.5 (GLOVE) ×2 IMPLANT
GLOVE SKINSENSE STRL SZ7.5 (GLOVE) ×1 IMPLANT
GOWN STRL REUS W/ TWL LRG LVL3 (GOWN DISPOSABLE) ×2 IMPLANT
GOWN STRL REUS W/TWL LRG LVL3 (GOWN DISPOSABLE) ×2
KIT BASIN OR (CUSTOM PROCEDURE TRAY) ×2 IMPLANT
KIT PLEURX DRAIN CATH 1000ML (MISCELLANEOUS) ×2 IMPLANT
KIT PLEURX DRAIN CATH 15.5FR (DRAIN) ×2 IMPLANT
KIT ROOM TURNOVER OR (KITS) ×2 IMPLANT
LIQUID BAND (GAUZE/BANDAGES/DRESSINGS) ×2 IMPLANT
NEEDLE HYPO 25GX1X1/2 BEV (NEEDLE) ×2 IMPLANT
NS IRRIG 1000ML POUR BTL (IV SOLUTION) ×2 IMPLANT
PACK GENERAL/GYN (CUSTOM PROCEDURE TRAY) ×2 IMPLANT
PAD ARMBOARD 7.5X6 YLW CONV (MISCELLANEOUS) ×4 IMPLANT
SET DRAINAGE LINE (MISCELLANEOUS) IMPLANT
SPONGE GAUZE 4X4 12PLY STER LF (GAUZE/BANDAGES/DRESSINGS) ×2 IMPLANT
SUT ETHILON 3 0 FSL (SUTURE) ×2 IMPLANT
SUT VIC AB 3-0 X1 27 (SUTURE) ×2 IMPLANT
SYR CONTROL 10ML LL (SYRINGE) ×2 IMPLANT
TOWEL OR 17X24 6PK STRL BLUE (TOWEL DISPOSABLE) ×2 IMPLANT
TOWEL OR 17X26 10 PK STRL BLUE (TOWEL DISPOSABLE) ×2 IMPLANT
VALVE REPLACEMENT CAP (MISCELLANEOUS) IMPLANT
WATER STERILE IRR 1000ML POUR (IV SOLUTION) ×2 IMPLANT

## 2015-10-21 NOTE — Transfer of Care (Signed)
Immediate Anesthesia Transfer of Care Note  Patient: Public relations account executive  Procedure(s) Performed: Procedure(s): INSERTION left PLEURAL DRAINAGE CATHETER (Left)  Patient Location: PACU  Anesthesia Type:MAC  Level of Consciousness: awake, alert  and oriented  Airway & Oxygen Therapy: Patient Spontanous Breathing and Patient connected to nasal cannula oxygen  Post-op Assessment: Report given to RN and Post -op Vital signs reviewed and stable  Post vital signs: Reviewed and stable  Last Vitals:  Filed Vitals:   10/21/15 1009 10/21/15 1159  BP: 153/75 101/63  Pulse: 103 83  Temp: 36.7 C 36.4 C  Resp: 22 13    Complications: No apparent anesthesia complications

## 2015-10-21 NOTE — H&P (Signed)
[address]       Diane [name] [id number]             [id number]          CARDIOTHORACIC SURGERY HISTORY AND PHYSICAL EXAM  PCP is KOIRALA,DIBAS, MD Referring Provider is Dolores Patty , MD   Chief Complaint:  Shortness of breath  HPI:  Patient is a 55 year old female who underwent mitral valve repair, ligation of patent ductus arteriosus, and clipping of left atrial appendage on 07/22/2015 for severe symptomatic mitral regurgitation with patent ductus arteriosus. Prior to her surgery she originally presented with symptoms of syncope and exertional shortness of breath. Her early postoperative recovery was uneventful and she was discharged home on the fifth postoperative day. On 08/05/2015 she suffered a cardiac arrest at home and was resuscitated initially by her daughter and subsequently by EMS. She had ventricular tachycardia upon arrival in the emergency department and was cardioverted. She was treated with hypothermia and recovered without any residual cognitive deficits. She remained in the hospital for an extended period of time.  Cardiac MRI revealed ejection fraction 45% with hypokinesis of the distal septum and apex. There was diffuse delayed enhancement in the subendocardium concerning for possible myocarditis or infiltrative cardiomyopathy. EKG revealed QT prolongation and she eventually underwent ICD placement for secondary prevention. During that time she had problem is with severe orthostatic hypotension. She developed a large left pleural effusion that required thoracentesis. Pleural effusion reaccumulated and she underwent a second thoracentesis early last month. Recently she was seen in follow-up by Dr. Gala Romney and repeat chest x-ray revealed some reaccumulation of the patient's left pleural effusion. The patient was referred for Pleur-X catheter placement.  Patient reports some shortness of breath. She denies cough.  She has persistent hoarseness of  voice that is related to paralyzed left vocal cord. She otherwise feels well. The remainder of her review of systems is noncontributory.  Past Medical History  Diagnosis Date  . Severe mitral regurgitation 07/03/2015  . Chronic combined systolic and diastolic CHF (congestive heart failure) (HCC) 07/03/2015    Grade 3 diastolic dysfunction.  LVEF 40-45% 05/2015.  Marland Kitchen Aortic aneurysm (HCC)   . Patent ductus arteriosus 07/16/2015  . Depression     SOME DEPRESSION (ON MEDS FOR 2 YRS) WHEN HER MOTHER PASSED  . 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 07/22/2015    26 mm Sorin Memo 3D ring annuloplasty with ligation of patent ductus arteriosus and clipping of LA appendage  . Cardiac arrest (HCC)   . AICD (automatic cardioverter/defibrillator) present   . Shortness of breath dyspnea   . Anxiety   . Arthritis   . Anemia     Past Surgical History  Procedure Laterality Date  . Ovarian cyst removal    . Tee without cardioversion N/A 07/07/2015    Procedure: TRANSESOPHAGEAL ECHOCARDIOGRAM (TEE);  Surgeon: Chilton Si, MD;  Location: [facility] ENDOSCOPY;  Service: Cardiovascular;  Laterality: N/A;  . Cardiac catheterization N/A 07/08/2015    Procedure: Right/Left Heart Cath and Coronary Angiography;  Surgeon: Marykay Lex, MD;  Location: [facility] INVASIVE CV LAB;  Service: Cardiovascular;  Laterality: N/A;  . Mitral valve repair N/A 07/22/2015    Procedure: MITRAL VALVE REPAIR;  Surgeon: Purcell Nails, MD;  Location: [facility] OR;  Service: Open Heart Surgery;  Laterality: N/A;  26 Sorin Memo 3D  . Tee without cardioversion N/A 07/22/2015    Procedure: TRANSESOPHAGEAL ECHOCARDIOGRAM (TEE);  Surgeon: Purcell Nails, MD;  Location: [facility] OR;  Service: Open Heart Surgery;  Laterality: N/A;  . Patent ductus arterious repair N/A 07/22/2015    Procedure: PATENT DUCTUS ARTERIOSUS (PDA) ligation;  Surgeon: Purcell Nails, MD;  Location: [facility] OR;  Service: Open Heart Surgery;  Laterality: N/A;  .  Clipping of atrial appendage  07/22/2015    Procedure: CLIPPING OF LEFT ATRIAL APPENDAGE;  Surgeon: Purcell Nails, MD;  Location: [facility] OR;  Service: Open Heart Surgery;;  35 Atriclip Pro  . Ep implantable device N/A 08/15/2015    Procedure: ICD Implant;  Surgeon: Will Jorja Loa, MD;  Location: [facility] INVASIVE CV LAB;  Service: Cardiovascular;  Laterality: N/A;  . Colonoscopy      Family History  Problem Relation Age of Onset  . Hypertension Mother     Social History Social History  Substance Use Topics  . Smoking status: Never Smoker   . Smokeless tobacco: Never Used  . Alcohol Use: No    Prior to Admission medications   Medication Sig Start Date End Date Taking? Authorizing Provider  buPROPion (WELLBUTRIN) 75 MG tablet Take 0.5 tablets (37.5 mg total) by mouth 2 (two) times daily. 08/28/15  Yes Shanker Levora Dredge, MD  ENSURE (ENSURE) Take 237 mLs by mouth daily as needed (appetite).    Yes Historical Provider, MD  midodrine (PROAMATINE) 10 MG tablet Take 1 tablet (10 mg total) by mouth 3 (three) times daily. 10/01/15  Yes Chilton Si, MD  warfarin (COUMADIN) 1 MG tablet Take 2-3 mg by mouth daily. Take 2 mg daily except 3 mg on Monday and Friday   Yes Historical Provider, MD    Allergies  Allergen Reactions  . Vicodin [Hydrocodone-Acetaminophen] Other (See Comments)    "she feels out of it", hallucinating  . Adhesive [Tape] Itching and Rash  . Silicone Itching and Rash    TAPE ALLERGY/EKG STICKER ALLERGY, Please use "paper" tape only    Review of Systems:  Per HPI  Physical Exam:   BP 153/75 mmHg  Pulse 103  Temp(Src) 98 F (36.7 C) (Oral)  Resp 22  Ht 5\' 2"  (1.575 m)  Wt 101 lb (45.813 kg)  BMI 18.47 kg/m2  SpO2 100%  General:    well-appearing  HEENT:  Unremarkable   Neck:   no JVD, no bruits, no adenopathy   Chest:   clear to auscultation, diminished breath sounds at left base, no wheezes, no rhonchi   CV:   RRR, no  murmur   Abdomen:  soft, non-tender, no  masses   Extremities:  warm, well-perfused, pulses palpable  Rectal/GU  Deferred  Neuro:   Grossly non-focal and symmetrical throughout  Skin:   Clean and dry, no rashes, no breakdown  Diagnostic Tests:  CHEST 2 VIEW  COMPARISON: 10/17/2015  FINDINGS: Cardiomediastinal silhouette is stable. Status post median sternotomy and mitral valve repair. Again noted left atrial appendage clip. Dual lead cardiac pacemaker is unchanged in position. There is small left pleural effusion with left basilar atelectasis or infiltrate. No pulmonary edema. Right lung is clear.  IMPRESSION: Status post median sternotomy. Dual lead cardiac pacemaker is unchanged in position. Small left pleural effusion with left basilar atelectasis or infiltrate. There is decrease in pleural effusion from prior exam.   Electronically Signed  By: Natasha Mead M.D.  On: 10/21/2015 10:56   Impression:  Recurrent left pleural effusion   Plan:  Pleur-X catheter placement.  I discussed the indications, risks, potential benefits with the patient.  Alternative treatment strategies been discussed. She provides full informed consent for the procedure as described.    Salvatore Decent. Cornelius Moras, MD

## 2015-10-21 NOTE — Discharge Planning (Signed)
Resumption of care for [facility]. Written orders faxed to [facility] and [employer].

## 2015-10-21 NOTE — Anesthesia Preprocedure Evaluation (Addendum)
Anesthesia Evaluation  Patient identified by MRN, date of birth, ID band Patient awake    Reviewed: Allergy & Precautions, NPO status , Patient's Chart, lab work & pertinent test results, reviewed documented beta blocker date and time   Airway Mallampati: II  TM Distance: >3 FB Neck ROM: Full   Comment: vocal cord paralysis Dental  (+) Teeth Intact, Dental Advisory Given   Pulmonary shortness of breath and with exertion,    breath sounds clear to auscultation       Cardiovascular + Peripheral Vascular Disease and +CHF  + Cardiac Defibrillator  Rhythm:Regular Rate:Normal - Systolic murmurs Grade 3 diastolic dysfunction. 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 Distal septal apical hypokinesis EF 45% 2) Diffuse delayed gadolinium uptake in the subendocardial and mid epicardial areas especially marked in the inferior wall 3) S/P mitral valve repair with no residual MR and annuloplasty ring 4) Atri-Clip of the LAA with no flow 5) No residual PDA flow 6) Mild LAE 7) Small pericardial effusion 8) Large left pleural effusion Note delayed gadolinium findings may be consistent with myocarditis vs infiltrative cardiomyopathy   Neuro/Psych PSYCHIATRIC DISORDERS Anxiety Depression negative neurological ROS     GI/Hepatic negative GI ROS, Neg liver ROS,   Endo/Other  negative endocrine ROS  Renal/GU negative Renal ROS     Musculoskeletal  (+) Arthritis , Osteoarthritis,    Abdominal   Peds  Hematology   Anesthesia Other Findings Day of surgery medications reviewed with the patient.  Reproductive/Obstetrics                         Anesthesia Physical Anesthesia Plan  ASA: IV  Anesthesia Plan: MAC   Post-op Pain Management:    Induction: Intravenous  Airway Management  Planned: Nasal Cannula  Additional Equipment:   Intra-op Plan:   Post-operative Plan:   Informed Consent: I have reviewed the patients History and Physical, chart, labs and discussed the procedure including the risks, benefits and alternatives for the proposed anesthesia with the patient or authorized representative who has indicated his/her understanding and acceptance.   Dental advisory given  Plan Discussed with: CRNA and Anesthesiologist  Anesthesia Plan Comments: (Discussed risks/benefits/alternatives to MAC sedation including need for ventilatory support, hypotension, need for conversion to general anesthesia.  All patient questions answered.  Patient/guardian wishes to proceed.)        Anesthesia Quick Evaluation                                  Anesthesia Evaluation  Patient identified by MRN, date of birth, ID band Patient awake    Reviewed: Allergy & Precautions, NPO status , Patient's Chart, lab work & pertinent test results, reviewed documented beta blocker date and time   Airway Mallampati: II  TM Distance: >3 FB Neck ROM: Full    Dental  (+) Teeth Intact   Pulmonary shortness of breath and with exertion,    breath sounds clear to auscultation       Cardiovascular +CHF   Rhythm:Regular + Systolic murmurs    Neuro/Psych Anxiety    GI/Hepatic   Endo/Other    Renal/GU      Musculoskeletal   Abdominal   Peds  Hematology   Anesthesia Other Findings   Reproductive/Obstetrics  Anesthesia Physical Anesthesia Plan  ASA: IV  Anesthesia Plan: General   Post-op Pain Management:    Induction: Intravenous  Airway Management Planned: Oral ETT  Additional Equipment: Arterial line, CVP, PA Cath, TEE, 3D TEE and Ultrasound Guidance Line Placement  Intra-op Plan:   Post-operative Plan: Post-operative intubation/ventilation  Informed Consent:   Dental advisory given  Plan  Discussed with: Anesthesiologist, Surgeon and CRNA  Anesthesia Plan Comments:        Anesthesia Quick Evaluation

## 2015-10-21 NOTE — Progress Notes (Signed)
Chest XRay done in PACU 

## 2015-10-21 NOTE — Op Note (Signed)
CARDIOTHORACIC SURGERY OPERATIVE NOTE  Date of Procedure:  10/21/2015  Preoperative Diagnosis: Recurrent Left Pleural Effusion  Postoperative Diagnosis: Same  Procedure:   Placement of Left Pleur-X Catheter  Surgeon:   Salvatore Decent. Cornelius Moras, MD  Anesthesia:   1% lidocaine local with intravenous sedation (MAC)      DETAILS OF THE OPERATIVE PROCEDURE  Following full informed consent the patient was brought to the operating room where intravenous sedation was administered and the patient continuously monitored by the anesthesia staff. The left chest was prepared and draped in a sterile manner. A timeout procedure was performed.  1% lidocaine was utilized to anesthetize the skin and subcutaneous tissues. A small stab incision was made and an angiocath placed into the left pleural space near the posterior axillary line. A guidewire was advanced through the angiocath and positioned in the posterior pleural space using fluoroscopic guidance.  A Pleur-X catheter was tunneled from a second stab incision located more anteriorly to the posterior stab incision.  Dilating catheters and an introducing sheath were passed over the guidewire.  The guidewire and dilating catheter were removed and the Pleur-X catheter advanced through the introducing sheath into the pleural space.  The sheath was removed and the final position of the catheter verified using fluoroscopy.  The catheter was secured to the skin and connected to a closed suction vacuum bottle for drainage. A total of 550 mL of clear serous fluid was evacuated.  The patient tolerated the procedure well and was transported to the PACU in stable condition.  There were no complications.    Salvatore Decent. Cornelius Moras, MD 10/21/2015 11:43 AM

## 2015-10-21 NOTE — OR Nursing (Signed)
550 ml pleural fluid removed from pleurex catheter

## 2015-10-22 ENCOUNTER — Encounter (HOSPITAL_COMMUNITY): Payer: Self-pay | Admitting: Thoracic Surgery (Cardiothoracic Vascular Surgery)

## 2015-10-22 NOTE — Anesthesia Postprocedure Evaluation (Signed)
Anesthesia Post Note  Patient: Public relations account executive  Procedure(s) Performed: Procedure(s) (LRB): INSERTION left PLEURAL DRAINAGE CATHETER (Left)  Patient location during evaluation: PACU Anesthesia Type: MAC Level of consciousness: awake and alert Pain management: pain level controlled Vital Signs Assessment: post-procedure vital signs reviewed and stable Respiratory status: spontaneous breathing, nonlabored ventilation, respiratory function stable and patient connected to nasal cannula oxygen Cardiovascular status: stable and blood pressure returned to baseline Anesthetic complications: no    Last Vitals:  Filed Vitals:   10/21/15 1245 10/21/15 1300  BP:    Pulse: 85 88  Temp:    Resp: 13 30    Last Pain: There were no vitals filed for this visit.               Branna Cortina S

## 2015-10-23 ENCOUNTER — Other Ambulatory Visit: Payer: Self-pay | Admitting: *Deleted

## 2015-10-23 DIAGNOSIS — Z9889 Other specified postprocedural states: Secondary | ICD-10-CM

## 2015-10-27 ENCOUNTER — Ambulatory Visit (HOSPITAL_BASED_OUTPATIENT_CLINIC_OR_DEPARTMENT_OTHER): Admission: RE | Admit: 2015-10-27 | Payer: BLUE CROSS/BLUE SHIELD | Source: Ambulatory Visit | Admitting: Otolaryngology

## 2015-10-27 ENCOUNTER — Encounter (HOSPITAL_BASED_OUTPATIENT_CLINIC_OR_DEPARTMENT_OTHER): Admission: RE | Payer: Self-pay | Source: Ambulatory Visit

## 2015-10-27 SURGERY — MICROLARYNGOSCOPY, WITH VOCAL CORD INJECTION
Anesthesia: General

## 2015-10-28 ENCOUNTER — Other Ambulatory Visit: Payer: Self-pay | Admitting: Internal Medicine

## 2015-10-28 ENCOUNTER — Ambulatory Visit (INDEPENDENT_AMBULATORY_CARE_PROVIDER_SITE_OTHER): Payer: BLUE CROSS/BLUE SHIELD | Admitting: *Deleted

## 2015-10-28 DIAGNOSIS — Z7901 Long term (current) use of anticoagulants: Secondary | ICD-10-CM | POA: Diagnosis not present

## 2015-10-28 DIAGNOSIS — Z9889 Other specified postprocedural states: Secondary | ICD-10-CM

## 2015-10-28 LAB — POCT INR: INR: 1.2

## 2015-10-30 ENCOUNTER — Telehealth: Payer: Self-pay | Admitting: *Deleted

## 2015-10-30 ENCOUNTER — Other Ambulatory Visit (HOSPITAL_COMMUNITY): Payer: Self-pay | Admitting: Otolaryngology

## 2015-10-30 NOTE — H&P (Signed)
Diane [name], Diane [name] 55 y.o., female [id number]     Chief Complaint: hoarseness  HPI:  Diane [name] is a 55 y.o. female patient of Maryruth Eve, MD for evaluation of hoarseness and cough. She has been coughing maybe a year or more. Sometimes she would actually faint. Chest x-ray and cardiac stress testing was negative. 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 She underwent open heart repair 3 weeks ago. She was hospitalized for roughly one week. At home, she fainted, and was brought back into the hospital where she remained in a coma for roughly one week. She did have a defibrillator installed. Since the mitral valve repair, she has been substantially hoarse and breathy. Talking is fatiguing. No actual breathing difficulty. She has aspiration with swallowing, especially liquids. It sounds like she worked with speech pathology in the hospital to talk her some accommodations which she has been relatively poor to embrace. She has lost weight since the surgery owing to difficulty swallowing. She feels like her blood pressure is still low, and she is still fainting occasionally.    Diane [name] is a 54 y.o. female patient of Maryruth Eve, MD for pre op evaluation for vocal cord augmentation for LEFT vocal cord paralysis.  Several months recheck. She continues to lose some weight mostly from anorexia. She still has a catheter in her LEFT pleural space and has fluid evacuated several times each week. She had more than 1 L evacuated this week so far. She has a defibrillator. She is on Coumadin on account of atrial fibrillation.   She has a LEFT vocal cord paralysis. It is unclear whether this might be temporary or permanent. We will start with a Radiesse vocal fold augmentation in both cords. I discussed the surgery in detail including risks and complications. Questions were answered and informed consent was obtained. I emphasized dietary support. We will use Tessalon  Perles for cough suppression postop. I will see her back here 4 weeks postop. PMH/Meds/All/SocHx/FamHx/ROS:     Past Medical History       Past Medical History:  Diagnosis Date  . Heart disease 07/22/2015         Past Surgical History        Past Surgical History:  Procedure Laterality Date  . CARDIAC PACEMAKER PLACEMENT      . MITRAL VALVE REPAIR            No family history of bleeding disorders, wound healing problems or difficulty with anesthesia.     Social History   Social History         Social History  . Marital status: Married      Spouse name: N/A  . Number of children: N/A  . Years of education: N/A       Occupational History  . Not on file.        Social History Main Topics  . Smoking status: Never Smoker  . Smokeless tobacco: Never Used  . Alcohol use No  . Drug use: Not on file  . Sexual activity: Not on file        Other Topics Concern  . Not on file    Social History Narrative          Current Outpatient Prescriptions:  . buPROPion (WELLBUTRIN) 75 MG tablet, Take by mouth., Disp: , Rfl:  . LACTOSE-REDUCED FOOD (BOOST BREEZE NUTRITIONAL ORAL), Take 1 Container by mouth., Disp: , Rfl:  . midodrine (PROAMATINE) 10 MG  tablet, Take by mouth., Disp: , Rfl:  . ondansetron (ZOFRAN, AS HYDROCHLORIDE,) 4 MG tablet, Take by mouth., Disp: , Rfl:  . pantoprazole (PROTONIX) 40 MG tablet, Take by mouth., Disp: , Rfl:  . warfarin (COUMADIN) 1 MG tablet *ANTICOAGULANT*, Take by mouth., Disp: , Rfl:  . benzonatate (TESSALON) 100 MG capsule, Take 1 capsule (100 mg total) by mouth 3 (three) times daily as needed for Cough., Disp: 30 capsule, Rfl: 1 . enoxaparin (LOVENOX) 100 mg/mL Syrg injection *ANTICOAGULANT*, INJECT 0.5 MLS (50 MG TOTAL) INTO THE SKIN DAILY., Disp: , Rfl: 0 . fludrocortisone (FLORINEF) 0.1 mg tablet, TAKE 2 TABLETS BY MOUTH TWICE A DAY, Disp: , Rfl: 0 . food supplemt, lactose-reduced (ENSURE) Liqd, Take by mouth., Disp: , Rfl:  .  furosemide (LASIX) 20 MG tablet, TAKE 1 TABLET EVERY OTHER DAY, Disp: , Rfl: 3 . KLOR-CON M20 20 mEq extended release tablet, TAKE 1 TABLET EVERY OTHER DAY, Disp: , Rfl: 3 . oxyCODONE (ROXICODONE) 5 MG immediate release tablet, TAKE 1 TABLET EVERY 4 HOURS AS NEEDED SEVERE PAIN, Disp: , Rfl: 0 . traMADol (ULTRAM) 50 mg tablet, TAKE 1 TABLET BY MOUTH EVERY 8 HOURS AS NEEDED FOR SEVERE PAIN, Disp: , Rfl: 0   A complete ROS was performed with pertinent positives/negatives noted in the HPI. The remainder of the ROS are negative.      Physical Exam:     There were no vitals taken for this visit.   Examination: She is thin but energetic. Mental status is appropriate. She hears well in conversational speech. Her voice is almost aphonic. Respirations unlabored. Ears are clear. Anterior nose clear. Oral cavity is moist and clear. Neck unremarkable.   Lungs: Clear to auscultation Heart: Regular rate and rhythm without murmurs Abdomen: Soft, active Extremities: Normal configuration Neurologic: Symmetric, grossly intact.       Impression & Plans:    Impression: LEFT vocal cord paralysis with severe voice dysfunction.   Plan: We will proceed with Radiesse augmentation next week. I spoke with one of her cardiologists, Dr. Susette Racer, who feels we are okay to stop her Coumadin temporarily preceding surgery. She can use her voice as early as possible. She needs to continue with more aggressive alimentation especially as she continues to lose pleural effusion fluid. Recheck here 4 weeks after surgery.      Flo Shanks 10/30/2015, 12:26 PM

## 2015-10-30 NOTE — Telephone Encounter (Signed)
DR Lazarus Salines  SPOKE TO DR HARDING IN REGARDS TO PATIENT  PER DR HARDING, PATIENT CAN HOLD ANTICOAG.- WARFARIN 5 DAYS PRIOR  TO  PRODCECURE AND THEN RESTART  WILL NOTIFY KRISTIN, ( FOR WARFARIN DOSING)

## 2015-11-03 ENCOUNTER — Encounter: Payer: Self-pay | Admitting: Thoracic Surgery (Cardiothoracic Vascular Surgery)

## 2015-11-03 ENCOUNTER — Encounter (HOSPITAL_COMMUNITY): Payer: Self-pay | Admitting: *Deleted

## 2015-11-03 ENCOUNTER — Ambulatory Visit
Admission: RE | Admit: 2015-11-03 | Discharge: 2015-11-03 | Disposition: A | Discharge status: Home / Self Care | Payer: BLUE CROSS/BLUE SHIELD | Source: Ambulatory Visit | Attending: Thoracic Surgery (Cardiothoracic Vascular Surgery) | Admitting: Thoracic Surgery (Cardiothoracic Vascular Surgery)

## 2015-11-03 ENCOUNTER — Ambulatory Visit (INDEPENDENT_AMBULATORY_CARE_PROVIDER_SITE_OTHER): Payer: BLUE CROSS/BLUE SHIELD | Admitting: Thoracic Surgery (Cardiothoracic Vascular Surgery)

## 2015-11-03 VITALS — BP 93/52 | HR 90 | Resp 20 | Ht 62.0 in | Wt 102.0 lb

## 2015-11-03 DIAGNOSIS — Z9889 Other specified postprocedural states: Secondary | ICD-10-CM

## 2015-11-03 DIAGNOSIS — J948 Other specified pleural conditions: Secondary | ICD-10-CM

## 2015-11-03 DIAGNOSIS — J90 Pleural effusion, not elsewhere classified: Secondary | ICD-10-CM | POA: Diagnosis not present

## 2015-11-03 DIAGNOSIS — I340 Nonrheumatic mitral (valve) insufficiency: Secondary | ICD-10-CM | POA: Diagnosis not present

## 2015-11-03 NOTE — Patient Instructions (Signed)
Continue all previous medications without any changes at this time  Continue to drain Pleur-X catheter 3 times/week

## 2015-11-03 NOTE — Progress Notes (Signed)
Anesthesia Chart Review:  Pt is a 55 year old female scheduled for microlaryngoscopy, radiesse vocal cord augmentation on 11/04/2015 with Dr. Lazarus Salines.   Pt is a same day work up.   PCP is Dr. Darrow Bussing. Cardiologist is Dr. Duke Salvia. EP cardiologist is Dr. Graciela Husbands. HF cardiologist is Dr. Gala Romney. CT surgeon is Dr. Cornelius Moras.   PMH includes:  Severe MR (s/p repair 07/2015), chronic combined CHF, cardiac arrest (08/05/15 after discharge home after MVR), AICD ([employer] placed 08/15/15 after arrest), PDA (s/p ligation 07/2015), aortic aneurysm, PAF, anemia. S/p pleural catheter insertion 10/21/15. S/p MVR, PDA ligation, LA appendage clipping 07/22/15.  Hospitalized 1/17-2/10/17 for cardiac arrest (V. tach/PEA-initial rhythm. Successfully resuscitated, since then has undergone AICD placement), ventilator dependent respiratory failure, acute on chronic CHF   Medications include: proamatine, coumadin. Last dose coumadin 10/28/15.   Labs will be obtained DOS.  Chest x-ray 11/03/15 reviewed. No active cardiopulmonary disease. Resolution of left effusion. Small left chest tube remains in place.  EKG 09/03/15: NSR. Left axis deviation. Anterior infarct, age undetermined. ST & T wave abnormality, consider lateral ischemia  Echo 09/04/15:  - Left ventricle: The cavity size was normal. There was mild concentric hypertrophy. Systolic function was moderately reduced. The estimated ejection fraction was in the range of 35% to 40%. Diffuse hypokinesis. - Aortic valve: Trileaflet; mildly thickened, mildly calcified leaflets. There was trivial regurgitation. - Mitral valve: S/P mitral valve repair with annuloplasty ring. The findings are consistent with mild stenosis. Valve area by continuity equation (using LVOT flow): 1.34 cm^2. - Tricuspid valve: There was mild regurgitation. - Pulmonary arteries: PA peak pressure: 39 mm Hg (S). - Pericardium, extracardiac: There was a left pleural effusion. - Impressions: The right  ventricular systolic pressure was increased consistent with mild pulmonary hypertension.  Cardiac cath 07/08/15:  Severe mitral regurgitation with large V waves and 4+ MR  There is mild to moderate left ventricular systolic dysfunction. With cardiac output/index moderate-severely reduced.  Angiographically normal coronary arteries  Reviewed case with Dr. Renold Don.   If labs acceptable DOS, I anticipate pt can proceed with surgery as scheduled.   Rica Mast, FNP-BC [facility]/Anesthesiology Phone: [phone] 11/03/2015 3:01 PM

## 2015-11-03 NOTE — Progress Notes (Signed)
      [address]       [place] [address]             [phone]     CARDIOTHORACIC SURGERY OFFICE NOTE  Referring Provider is Arvilla Meres, MD Primary Cardiologist is Marykay Lex, MD PCP is Darrow Bussing, MD   HPI:  Patient returns to the office today for follow-up status post placement of Pleurx catheter for recurrent left pleural effusion on 10/21/2015.  She has done well since then with no new problems or complaints. Her Pleurx catheter has been drained 3 times a week, typically yielding between 300 and 400 mL of thin serous fluid.  She states that her breathing is noticeably better. She still has some dizzy spells, typically in the morning when she first gets up. She is scheduled for focal cord medialization later this week.   Current Outpatient Prescriptions  Medication Sig Dispense Refill  . buPROPion (WELLBUTRIN) 75 MG tablet Take 0.5 tablets (37.5 mg total) by mouth 2 (two) times daily. 60 tablet 0  . ENSURE (ENSURE) Take 237 mLs by mouth daily as needed (appetite).     . midodrine (PROAMATINE) 10 MG tablet Take 1 tablet (10 mg total) by mouth 3 (three) times daily. 30 tablet 5  . oxyCODONE (OXY IR/ROXICODONE) 5 MG immediate release tablet Take 1 tablet (5 mg total) by mouth every 4 (four) hours as needed for severe pain. 40 tablet 0  . warfarin (COUMADIN) 1 MG tablet Take 2-3 mg by mouth daily. Take 2 mg daily except 3 mg on Monday and Friday     No current facility-administered medications for this visit.      Physical Exam:   BP 93/52 mmHg  Pulse 90  Resp 20  Ht 5\' 2"  (1.575 m)  Wt 102 lb (46.267 kg)  BMI 18.65 kg/m2  SpO2 97%  General:  Well-appearing  Chest:   Clear to auscultation  CV:   Regular rate and rhythm without murmur  Incisions:  n/a  Abdomen:  Soft nontender  Extremities:  Warm and well-perfused  Diagnostic Tests:  CHEST 2 VIEW  COMPARISON: 10/21/2015  FINDINGS: Heart size and pulmonary vascularity are  normal and the lungs are clear. AICD in place. Mitral valve prosthesis and left atrial appendage clip noted. Small left chest tube in place. No residual pleural effusion.  The lungs are clear. No acute bone abnormality.  IMPRESSION: No active cardiopulmonary disease. Resolution of left effusion. Small left chest tube remains in place.   Electronically Signed  By: Francene Boyers M.D.  On: 11/03/2015 10:37   Impression:  Patient is doing well with Pleurx catheter for management of recurrent left pleural effusion in the setting of chronic systolic congestive heart failure.   Plan:  We will plan to continue draining her catheter 3 times per week until the volume of fluid output decreases somewhat further. With not recommend any changes to the patient's current medications at this time. We will plan to see her back in 8 weeks or sooner should the need arise.  I spent in excess of 10 minutes during the conduct of this office consultation and >50% of this time involved direct face-to-face encounter with the patient for counseling and/or coordination of their care.   Salvatore Decent. Cornelius Moras, MD 11/03/2015 11:12 AM

## 2015-11-03 NOTE — Progress Notes (Addendum)
Spoke with pt for pre-op call. I just talked with her 2 weeks ago prior to a pleural catheter insertion. She states she did well with anesthesia for that surgery. She states medical and surgical history is the same, no changes. Denies any chest pain, but still has sob intermittently. States she is at Dr. Orvan July office now for a follow up appt. Pt states her last dose of Coumadin was 10/28/15. States she was not given a Lovenox bridge.

## 2015-11-04 ENCOUNTER — Ambulatory Visit (HOSPITAL_COMMUNITY)
Admission: RE | Admit: 2015-11-04 | Discharge: 2015-11-04 | Disposition: A | Discharge status: Home / Self Care | Payer: BLUE CROSS/BLUE SHIELD | Source: Ambulatory Visit | Attending: Otolaryngology | Admitting: Otolaryngology

## 2015-11-04 ENCOUNTER — Ambulatory Visit (HOSPITAL_COMMUNITY): Payer: BLUE CROSS/BLUE SHIELD | Admitting: Emergency Medicine

## 2015-11-04 ENCOUNTER — Encounter (HOSPITAL_COMMUNITY): Payer: Self-pay | Admitting: *Deleted

## 2015-11-04 ENCOUNTER — Encounter (HOSPITAL_COMMUNITY)
Admission: RE | Disposition: A | Discharge status: Home / Self Care | Payer: Self-pay | Source: Ambulatory Visit | Attending: Otolaryngology

## 2015-11-04 DIAGNOSIS — I4891 Unspecified atrial fibrillation: Secondary | ICD-10-CM | POA: Insufficient documentation

## 2015-11-04 DIAGNOSIS — Z79899 Other long term (current) drug therapy: Secondary | ICD-10-CM | POA: Insufficient documentation

## 2015-11-04 DIAGNOSIS — J3801 Paralysis of vocal cords and larynx, unilateral: Secondary | ICD-10-CM | POA: Insufficient documentation

## 2015-11-04 DIAGNOSIS — Z8674 Personal history of sudden cardiac arrest: Secondary | ICD-10-CM | POA: Diagnosis not present

## 2015-11-04 DIAGNOSIS — Z95810 Presence of automatic (implantable) cardiac defibrillator: Secondary | ICD-10-CM | POA: Diagnosis not present

## 2015-11-04 DIAGNOSIS — I5042 Chronic combined systolic (congestive) and diastolic (congestive) heart failure: Secondary | ICD-10-CM | POA: Insufficient documentation

## 2015-11-04 DIAGNOSIS — Z7901 Long term (current) use of anticoagulants: Secondary | ICD-10-CM | POA: Insufficient documentation

## 2015-11-04 DIAGNOSIS — J90 Pleural effusion, not elsewhere classified: Secondary | ICD-10-CM | POA: Diagnosis not present

## 2015-11-04 HISTORY — PX: MICROLARYNGOSCOPY WITH CO2 LASER AND EXCISION OF VOCAL CORD LESION: SHX5970

## 2015-11-04 LAB — BASIC METABOLIC PANEL
ANION GAP: 10 (ref 5–15)
BUN: 13 mg/dL (ref 6–20)
CO2: 26 mmol/L (ref 22–32)
Calcium: 9.4 mg/dL (ref 8.9–10.3)
Chloride: 102 mmol/L (ref 101–111)
Creatinine, Ser: 0.53 mg/dL (ref 0.44–1.00)
GFR calc Af Amer: >60 mL/min (ref >60)
GLUCOSE: 94 mg/dL (ref 65–99)
POTASSIUM: 4.4 mmol/L (ref 3.5–5.1)
Sodium: 138 mmol/L (ref 135–145)

## 2015-11-04 LAB — CBC
HCT: 38.8 % (ref 36.0–46.0)
Hemoglobin: 12.4 g/dL (ref 12.0–15.0)
MCH: 28.5 pg (ref 26.0–34.0)
MCHC: 32 g/dL (ref 30.0–36.0)
MCV: 89.2 fL (ref 78.0–100.0)
PLATELETS: 228 10*3/uL (ref 150–400)
RBC: 4.35 MIL/uL (ref 3.87–5.11)
RDW: 14.2 % (ref 11.5–15.5)
WBC: 6.1 10*3/uL (ref 4.0–10.5)

## 2015-11-04 LAB — PROTIME-INR
INR: 1.11 (ref 0.00–1.49)
PROTHROMBIN TIME: 14.4 s (ref 11.6–15.2)

## 2015-11-04 SURGERY — MICROLARYNGOSCOPY WITH CO2 LASER AND EXCISION OF VOCAL CORD LESION
Anesthesia: General | Site: Throat

## 2015-11-04 MED ORDER — PROPOFOL 10 MG/ML IV BOLUS
INTRAVENOUS | Status: AC
  Filled 2015-11-04: qty 20

## 2015-11-04 MED ORDER — SUGAMMADEX SODIUM 500 MG/5ML IV SOLN
INTRAVENOUS | Status: DC | PRN
  Administered 2015-11-04: 92.6 mg via INTRAVENOUS

## 2015-11-04 MED ORDER — SODIUM CHLORIDE 0.9 % IV SOLN
0.0125 ug/kg/min | INTRAVENOUS | Status: DC
  Filled 2015-11-04: qty 2000

## 2015-11-04 MED ORDER — LACTATED RINGERS IV SOLN
INTRAVENOUS | Status: DC
  Administered 2015-11-04: 07:00:00 via INTRAVENOUS

## 2015-11-04 MED ORDER — ACETAMINOPHEN 325 MG PO TABS
650.0000 mg | ORAL_TABLET | Freq: Once | ORAL | Status: AC
  Administered 2015-11-04: 650 mg via ORAL

## 2015-11-04 MED ORDER — SUGAMMADEX SODIUM 200 MG/2ML IV SOLN
INTRAVENOUS | Status: AC
  Filled 2015-11-04: qty 2

## 2015-11-04 MED ORDER — TRIAMCINOLONE ACETONIDE 40 MG/ML IJ SUSP
INTRAMUSCULAR | Status: AC
  Filled 2015-11-04: qty 5

## 2015-11-04 MED ORDER — FENTANYL CITRATE (PF) 100 MCG/2ML IJ SOLN
INTRAMUSCULAR | Status: DC | PRN
  Administered 2015-11-04: 50 ug via INTRAVENOUS
  Administered 2015-11-04: 25 ug via INTRAVENOUS

## 2015-11-04 MED ORDER — PHENYLEPHRINE HCL 10 MG/ML IJ SOLN
INTRAMUSCULAR | Status: DC | PRN
  Administered 2015-11-04: 40 ug via INTRAVENOUS

## 2015-11-04 MED ORDER — OXYMETAZOLINE HCL 0.05 % NA SOLN
NASAL | Status: DC | PRN
  Administered 2015-11-04: 2 via TOPICAL

## 2015-11-04 MED ORDER — FENTANYL CITRATE (PF) 250 MCG/5ML IJ SOLN
INTRAMUSCULAR | Status: AC
  Filled 2015-11-04: qty 5

## 2015-11-04 MED ORDER — ROCURONIUM BROMIDE 100 MG/10ML IV SOLN
INTRAVENOUS | Status: DC | PRN
Start: 2015-11-04 — End: 2015-11-04
  Administered 2015-11-04: 40 mg via INTRAVENOUS

## 2015-11-04 MED ORDER — OXYMETAZOLINE HCL 0.05 % NA SOLN
NASAL | Status: AC
  Filled 2015-11-04: qty 15

## 2015-11-04 MED ORDER — ACETAMINOPHEN 325 MG PO TABS
ORAL_TABLET | ORAL | Status: AC
  Filled 2015-11-04: qty 2

## 2015-11-04 MED ORDER — EPINEPHRINE HCL (NASAL) 0.1 % NA SOLN
NASAL | Status: AC
  Filled 2015-11-04: qty 30

## 2015-11-04 MED ORDER — ONDANSETRON HCL 4 MG/2ML IJ SOLN
INTRAMUSCULAR | Status: DC | PRN
  Administered 2015-11-04: 4 mg via INTRAVENOUS

## 2015-11-04 MED ORDER — MIDAZOLAM HCL 2 MG/2ML IJ SOLN
INTRAMUSCULAR | Status: AC
  Filled 2015-11-04: qty 2

## 2015-11-04 MED ORDER — LIDOCAINE HCL (CARDIAC) 20 MG/ML IV SOLN
INTRAVENOUS | Status: DC | PRN
  Administered 2015-11-04: 100 mg via INTRAVENOUS

## 2015-11-04 MED ORDER — MIDAZOLAM HCL 5 MG/5ML IJ SOLN
INTRAMUSCULAR | Status: DC | PRN
  Administered 2015-11-04: 2 mg via INTRAVENOUS

## 2015-11-04 MED ORDER — PROPOFOL 10 MG/ML IV BOLUS
INTRAVENOUS | Status: DC | PRN
Start: 2015-11-04 — End: 2015-11-04
  Administered 2015-11-04: 100 mg via INTRAVENOUS

## 2015-11-04 SURGICAL SUPPLY — 31 items
BALLN PULM 12 13.5 15X75 (BALLOONS)
BALLN PULMONARY 10-12 (MISCELLANEOUS) IMPLANT
BALLOON PULM 12 13.5 15X75 (BALLOONS) IMPLANT
BLADE SURG 15 STRL LF DISP TIS (BLADE) IMPLANT
BLADE SURG 15 STRL SS (BLADE)
CANISTER SUCTION 2500CC (MISCELLANEOUS) ×2 IMPLANT
CONT SPEC 4OZ CLIKSEAL STRL BL (MISCELLANEOUS) IMPLANT
COVER TABLE BACK 60X90 (DRAPES) ×2 IMPLANT
CRADLE DONUT ADULT HEAD (MISCELLANEOUS) ×2 IMPLANT
DRAPE PROXIMA HALF (DRAPES) ×2 IMPLANT
GAUZE SPONGE 4X4 12PLY STRL (GAUZE/BANDAGES/DRESSINGS) ×2 IMPLANT
GLOVE BIO SURGEON STRL SZ8 (GLOVE) ×2 IMPLANT
GOWN STRL REUS W/ TWL LRG LVL3 (GOWN DISPOSABLE) IMPLANT
GOWN STRL REUS W/ TWL XL LVL3 (GOWN DISPOSABLE) ×1 IMPLANT
GOWN STRL REUS W/TWL LRG LVL3 (GOWN DISPOSABLE)
GOWN STRL REUS W/TWL XL LVL3 (GOWN DISPOSABLE) ×1
KIT BASIN OR (CUSTOM PROCEDURE TRAY) ×2 IMPLANT
KIT PROLARN PLUS GEL W/NDL (Prosthesis and Implant ENT) ×2 IMPLANT
KIT ROOM TURNOVER OR (KITS) ×2 IMPLANT
MARKER SKIN DUAL TIP RULER LAB (MISCELLANEOUS) IMPLANT
NEEDLE HYPO 25GX1X1/2 BEV (NEEDLE) IMPLANT
NS IRRIG 1000ML POUR BTL (IV SOLUTION) ×2 IMPLANT
PAD ARMBOARD 7.5X6 YLW CONV (MISCELLANEOUS) ×4 IMPLANT
PAD EYE OVAL STERILE LF (GAUZE/BANDAGES/DRESSINGS) IMPLANT
PATTIES SURGICAL .5 X3 (DISPOSABLE) ×2 IMPLANT
SOLUTION ANTI FOG 6CC (MISCELLANEOUS) ×2 IMPLANT
SURGILUBE 2OZ TUBE FLIPTOP (MISCELLANEOUS) ×2 IMPLANT
SUT SILK 2 0 FS (SUTURE) IMPLANT
TOWEL OR 17X24 6PK STRL BLUE (TOWEL DISPOSABLE) ×4 IMPLANT
TUBE CONNECTING 12X1/4 (SUCTIONS) ×2 IMPLANT
WATER STERILE IRR 1000ML POUR (IV SOLUTION) ×2 IMPLANT

## 2015-11-04 NOTE — Discharge Instructions (Signed)
Advance diet and activity as comfortable Rinse mouth/throat with cool dilute salt water as desired and after meals. Resume Coumadin tonight or tomorrow AM Recheck my office 3 weeks, [phone] for an appointment. Call for pain, excessive cough, any breathing difficulty Tessilon perles for cough as needed

## 2015-11-04 NOTE — Op Note (Signed)
11/04/2015  10:04 AM    Sanjuan Dame, Pearletha Furl  [id number]   Pre-Op Dx:  Left vocal cord paralysis status post cardiac surgery  Post-op Dx: same  Proc: microlaryngoscopy with vocal cord augmentation   Surg:  Flo Shanks T MD  Anes:  GOT  EBL:  none  Comp:  none  Findings:  Bilateral vocal cord bowing.  Procedure: with the patient in a comfortable supine position, general orotracheal anesthesia was induced without difficulty. At an appropriate level, the table was turned 90 degrees And the patient placed in reverse Trendelenburg. A clean preparation and draping was accomplished.   To routine surgical timeout was obtained.  A rubber tooth guard was placed. Taking care to protect lips, teeth, and endotracheal tube,the laser anterior commissure laryngoscope was introduced but was too large to easily fit into the glottis and was removed.  A standard Hollinger anterior commissure laryngoscope was introduced and placed into the glottis and suspended. Findings were as described above. A rod telescope was used to inspect the larynx more closely and photographs were taken. Afrin solution was applied on a 0.5 x 3" cottonoid to the surfaces of both vocal cords for intraoperative hemostasis. Several minutes were allowed for this to take effect.  The cottonoid was removed. The ProLar  augmentation  Solution was applied under direct vision using the rod telescope. The left vocal cord was filled until there was reasonable bulging through the midsection of the cord. A good smooth contour was accomplished. Additional solution was applied into the right vocal cord to allow the 2 cords to meet for good phonation. Again a good smooth contour was accomplished.  Hemostasis was observed. An Afrin pledget was applied again and left in place for several minutes. Upon removal, photographs were taken. At this point the procedure was completed. The laryngoscope was then suspended and removed. The tooth guard was  removed. Dental status was intact. Patient was returned to anesthesia, awakened, extubated, and transferred to recovery in stable condition.  Dispo:   PACU to home  Plan:  Cough suppression. Ice. Ibuprofen for analgesia. Advance diet and activity promptly. Recheck my office 3 weeks.  Cephus Richer MD

## 2015-11-04 NOTE — Progress Notes (Signed)
Spoke to [place], rep. for [employer], stated anesthesia can use the magnet during surgery. When pt. Arrives to PACU they can take the magnet off and the ICD  will reconvert on it's own. She also stated PACU has a device where they get data from the ICD and send it back to main headquartes and if there is a problem it would be identified. Gave information to Dr. Sherron Ales.

## 2015-11-04 NOTE — Anesthesia Preprocedure Evaluation (Addendum)
Anesthesia Evaluation  Patient identified by MRN, date of birth, ID band Patient awake    Reviewed: Allergy & Precautions, H&P , Patient's Chart, lab work & pertinent test results, reviewed documented beta blocker date and time   Airway Mallampati: II  TM Distance: >3 FB Neck ROM: full    Dental no notable dental hx.    Pulmonary    Pulmonary exam normal breath sounds clear to auscultation       Cardiovascular + Cardiac Defibrillator  Rhythm:regular Rate:Normal     Neuro/Psych    GI/Hepatic   Endo/Other    Renal/GU      Musculoskeletal   Abdominal   Peds  Hematology   Anesthesia Other Findings S/P mitral valve repair  07/22/2015 26 mm Sorin Memo 3D ring annuloplasty   Cardiac arrest [facility])  ]  AICD  present   Severe mitral regurgitation 07/03/2015   Chronic combined systolic and diastolic CHF (congestive heart failure) (HCC) 07/03/2015  Grade 3 diastolic dysfunction. LVEF 40-45% 05/2015.  Aortic aneurysm (HCC)   Patent ductus      Reproductive/Obstetrics                            Anesthesia Physical Anesthesia Plan  ASA: II  Anesthesia Plan: General   Post-op Pain Management:    Induction: Intravenous  Airway Management Planned: Mask  Additional Equipment:   Intra-op Plan:   Post-operative Plan: Extubation in OR  Informed Consent: I have reviewed the patients History and Physical, chart, labs and discussed the procedure including the risks, benefits and alternatives for the proposed anesthesia with the patient or authorized representative who has indicated his/her understanding and acceptance.   Dental Advisory Given and Dental advisory given  Plan Discussed with: CRNA and Surgeon  Anesthesia Plan Comments: (TIVA; suggammadex;BIS We will only use magnet if bovie is used Query device in PACU if magnet used  Discussed general anesthesia, including possible nausea,  instrumentation of airway, sore throat,pulmonary aspiration, etc. I asked if the were any outstanding questions, or  concerns before we proceeded. )      Anesthesia Quick Evaluation                                   Anesthesia Evaluation  Patient identified by MRN, date of birth, ID band Patient awake    Reviewed: Allergy & Precautions, NPO status , Patient's Chart, lab work & pertinent test results, reviewed documented beta blocker date and time   Airway Mallampati: II  TM Distance: >3 FB Neck ROM: Full   Comment: vocal cord paralysis Dental  (+) Teeth Intact, Dental Advisory Given   Pulmonary shortness of breath and with exertion,    breath sounds clear to auscultation       Cardiovascular + Peripheral Vascular Disease and +CHF  + Cardiac Defibrillator  Rhythm:Regular Rate:Normal - Systolic murmurs Grade 3 diastolic dysfunction. 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 Distal septal apical hypokinesis EF 45% 2) Diffuse delayed gadolinium uptake in the subendocardial and mid epicardial areas especially marked in the inferior wall 3) S/P mitral valve repair with no residual MR and annuloplasty ring 4) Atri-Clip of the LAA with no flow 5) No residual PDA flow 6) Mild LAE 7) Small pericardial effusion 8) Large left pleural  effusion Note delayed gadolinium findings may be consistent with myocarditis vs infiltrative cardiomyopathy   Neuro/Psych PSYCHIATRIC DISORDERS Anxiety Depression negative neurological ROS     GI/Hepatic negative GI ROS, Neg liver ROS,   Endo/Other  negative endocrine ROS  Renal/GU negative Renal ROS     Musculoskeletal  (+) Arthritis , Osteoarthritis,    Abdominal   Peds  Hematology   Anesthesia Other Findings Day of surgery medications reviewed with the patient.  Reproductive/Obstetrics                          Anesthesia Physical Anesthesia Plan  ASA: IV  Anesthesia Plan: MAC   Post-op Pain Management:    Induction: Intravenous  Airway Management Planned: Nasal Cannula  Additional Equipment:   Intra-op Plan:   Post-operative Plan:   Informed Consent: I have reviewed the patients History and Physical, chart, labs and discussed the procedure including the risks, benefits and alternatives for the proposed anesthesia with the patient or authorized representative who has indicated his/her understanding and acceptance.   Dental advisory given  Plan Discussed with: CRNA and Anesthesiologist  Anesthesia Plan Comments: (Discussed risks/benefits/alternatives to MAC sedation including need for ventilatory support, hypotension, need for conversion to general anesthesia.  All patient questions answered.  Patient/guardian wishes to proceed.)        Anesthesia Quick Evaluation                                  Anesthesia Evaluation  Patient identified by MRN, date of birth, ID band Patient awake    Reviewed: Allergy & Precautions, NPO status , Patient's Chart, lab work & pertinent test results, reviewed documented beta blocker date and time   Airway Mallampati: II  TM Distance: >3 FB Neck ROM: Full    Dental  (+) Teeth Intact   Pulmonary shortness of breath and with exertion,    breath sounds clear to auscultation       Cardiovascular +CHF   Rhythm:Regular + Systolic murmurs    Neuro/Psych Anxiety    GI/Hepatic   Endo/Other    Renal/GU      Musculoskeletal   Abdominal   Peds  Hematology   Anesthesia Other Findings   Reproductive/Obstetrics                            Anesthesia Physical Anesthesia Plan  ASA: IV  Anesthesia Plan: General   Post-op Pain Management:    Induction: Intravenous  Airway Management Planned: Oral ETT  Additional Equipment: Arterial line, CVP, PA Cath,  TEE, 3D TEE and Ultrasound Guidance Line Placement  Intra-op Plan:   Post-operative Plan: Post-operative intubation/ventilation  Informed Consent:   Dental advisory given  Plan Discussed with: Anesthesiologist, Surgeon and CRNA  Anesthesia Plan Comments:        Anesthesia Quick Evaluation                                    Anesthesia Evaluation  Patient identified by MRN, date of birth, ID band Patient awake    Reviewed: Allergy & Precautions, NPO status , Patient's Chart, lab work & pertinent test results, reviewed documented beta blocker date and time   Airway Mallampati: II  TM Distance: >3 FB Neck ROM: Full   Comment: vocal  cord paralysis Dental  (+) Teeth Intact, Dental Advisory Given   Pulmonary shortness of breath and with exertion,    breath sounds clear to auscultation       Cardiovascular + Peripheral Vascular Disease and +CHF  + Cardiac Defibrillator  Rhythm:Regular Rate:Normal - Systolic murmurs Grade 3 diastolic dysfunction. 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 Distal septal apical hypokinesis EF 45% 2) Diffuse delayed gadolinium uptake in the subendocardial and mid epicardial areas especially marked in the inferior wall 3) S/P mitral valve repair with no residual MR and annuloplasty ring 4) Atri-Clip of the LAA with no flow 5) No residual PDA flow 6) Mild LAE 7) Small pericardial effusion 8) Large left pleural effusion Note delayed gadolinium findings may be consistent with myocarditis vs infiltrative cardiomyopathy   Neuro/Psych PSYCHIATRIC DISORDERS Anxiety Depression negative neurological ROS     GI/Hepatic negative GI ROS, Neg liver ROS,   Endo/Other  negative endocrine ROS  Renal/GU negative Renal ROS     Musculoskeletal  (+) Arthritis , Osteoarthritis,    Abdominal   Peds   Hematology   Anesthesia Other Findings Day of surgery medications reviewed with the patient.  Reproductive/Obstetrics                         Anesthesia Physical Anesthesia Plan  ASA: IV  Anesthesia Plan: MAC   Post-op Pain Management:    Induction: Intravenous  Airway Management Planned: Nasal Cannula  Additional Equipment:   Intra-op Plan:   Post-operative Plan:   Informed Consent: I have reviewed the patients History and Physical, chart, labs and discussed the procedure including the risks, benefits and alternatives for the proposed anesthesia with the patient or authorized representative who has indicated his/her understanding and acceptance.   Dental advisory given  Plan Discussed with: CRNA and Anesthesiologist  Anesthesia Plan Comments: (Discussed risks/benefits/alternatives to MAC sedation including need for ventilatory support, hypotension, need for conversion to general anesthesia.  All patient questions answered.  Patient/guardian wishes to proceed.)        Anesthesia Quick Evaluation                                  Anesthesia Evaluation  Patient identified by MRN, date of birth, ID band Patient awake    Reviewed: Allergy & Precautions, NPO status , Patient's Chart, lab work & pertinent test results, reviewed documented beta blocker date and time   Airway Mallampati: II  TM Distance: >3 FB Neck ROM: Full    Dental  (+) Teeth Intact   Pulmonary shortness of breath and with exertion,    breath sounds clear to auscultation       Cardiovascular +CHF   Rhythm:Regular + Systolic murmurs    Neuro/Psych Anxiety    GI/Hepatic   Endo/Other    Renal/GU      Musculoskeletal   Abdominal   Peds  Hematology   Anesthesia Other Findings   Reproductive/Obstetrics                            Anesthesia Physical Anesthesia Plan  ASA: IV  Anesthesia Plan: General   Post-op Pain  Management:    Induction: Intravenous  Airway Management Planned: Oral ETT  Additional Equipment: Arterial line, CVP,  PA Cath, TEE, 3D TEE and Ultrasound Guidance Line Placement  Intra-op Plan:   Post-operative Plan: Post-operative intubation/ventilation  Informed Consent:   Dental advisory given  Plan Discussed with: Anesthesiologist, Surgeon and CRNA  Anesthesia Plan Comments:        Anesthesia Quick Evaluation

## 2015-11-04 NOTE — Transfer of Care (Signed)
Immediate Anesthesia Transfer of Care Note  Patient: Diane [name]  Procedure(s) Performed: Procedure(s): MICROLARYNGOSCOPY, RADIESSE VOCAL CORD AUGMENTATION (N/A)  Patient Location: PACU  Anesthesia Type:General  Level of Consciousness: awake, alert  and oriented  Airway & Oxygen Therapy: Patient Spontanous Breathing and Patient connected to nasal cannula oxygen  Post-op Assessment: Report given to RN, Post -op Vital signs reviewed and stable and Patient moving all extremities X 4  Post vital signs: Reviewed and stable  Last Vitals:  Filed Vitals:   11/04/15 0718  BP: 132/78  Pulse: 90  Temp: 36.6 C  Resp: 20    Complications: No apparent anesthesia complications

## 2015-11-04 NOTE — Interval H&P Note (Signed)
History and Physical Interval Note:  11/04/2015 8:58 AM  Diane [name]  has presented today for surgery, with the diagnosis of LEFT VOCAL CORD PARALYSES  The various methods of treatment have been discussed with the patient and family. After consideration of risks, benefits and other options for treatment, the patient has consented to  Procedure(s): MICROLARYNGOSCOPY, RADIESSE VOCAL CORD AUGMENTATION (N/A) as a surgical intervention .  The patient's history has been re-reviewed, patient re-examined, no change in status, stable for surgery.  I have re-reviewed the patient's chart and labs.  Questions were answered to the patient's satisfaction.     Flo Shanks

## 2015-11-04 NOTE — Anesthesia Procedure Notes (Signed)
Procedure Name: Intubation Date/Time: 11/04/2015 9:17 AM Performed by: Carmela Rima Pre-anesthesia Checklist: Patient identified, Emergency Drugs available, Suction available, Patient being monitored and Timeout performed Patient Re-evaluated:Patient Re-evaluated prior to inductionOxygen Delivery Method: Circle System Utilized Preoxygenation: Pre-oxygenation with 100% oxygen Intubation Type: IV induction Ventilation: Mask ventilation without difficulty Laryngoscope Size: Mac and 3 Grade View: Grade I Tube type: Oral Tube size: 5.5 mm Number of attempts: 1 Placement Confirmation: ETT inserted through vocal cords under direct vision,  positive ETCO2 and breath sounds checked- equal and bilateral Secured at: 22 cm Tube secured with: Tape Dental Injury: Teeth and Oropharynx as per pre-operative assessment

## 2015-11-04 NOTE — H&P (View-Only) (Signed)
Metzger,  Rajanae 55 y.o., female [id number]     Chief Complaint: hoarseness  HPI:  Diane [name] is a 55 y.o. female patient of Sharon Aberin Wolters, MD for evaluation of hoarseness and cough. She has been coughing maybe a year or more. Sometimes she would actually faint. Chest x-ray and cardiac stress testing was negative. 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 She underwent open heart repair 3 weeks ago. She was hospitalized for roughly one week. At home, she fainted, and was brought back into the hospital where she remained in a coma for roughly one week. She did have a defibrillator installed. Since the mitral valve repair, she has been substantially hoarse and breathy. Talking is fatiguing. No actual breathing difficulty. She has aspiration with swallowing, especially liquids. It sounds like she worked with speech pathology in the hospital to talk her some accommodations which she has been relatively poor to embrace. She has lost weight since the surgery owing to difficulty swallowing. She feels like her blood pressure is still low, and she is still fainting occasionally.    Diane [name] is a 55 y.o. female patient of Sharon Aberin Wolters, MD for pre op evaluation for vocal cord augmentation for LEFT vocal cord paralysis.  Several months recheck. She continues to lose some weight mostly from anorexia. She still has a catheter in her LEFT pleural space and has fluid evacuated several times each week. She had more than 1 L evacuated this week so far. She has a defibrillator. She is on Coumadin on account of atrial fibrillation.   She has a LEFT vocal cord paralysis. It is unclear whether this might be temporary or permanent. We will start with a Radiesse vocal fold augmentation in both cords. I discussed the surgery in detail including risks and complications. Questions were answered and informed consent was obtained. I emphasized dietary support. We will use Tessalon  Perles for cough suppression postop. I will see her back here 4 weeks postop. PMH/Meds/All/SocHx/FamHx/ROS:     Past Medical History       Past Medical History:  Diagnosis Date  . Heart disease 07/22/2015         Past Surgical History        Past Surgical History:  Procedure Laterality Date  . CARDIAC PACEMAKER PLACEMENT      . MITRAL VALVE REPAIR            No family history of bleeding disorders, wound healing problems or difficulty with anesthesia.     Social History   Social History         Social History  . Marital status: Married      Spouse name: N/A  . Number of children: N/A  . Years of education: N/A       Occupational History  . Not on file.        Social History Main Topics  . Smoking status: Never Smoker  . Smokeless tobacco: Never Used  . Alcohol use No  . Drug use: Not on file  . Sexual activity: Not on file        Other Topics Concern  . Not on file    Social History Narrative          Current Outpatient Prescriptions:  . buPROPion (WELLBUTRIN) 75 MG tablet, Take by mouth., Disp: , Rfl:  . LACTOSE-REDUCED FOOD (BOOST BREEZE NUTRITIONAL ORAL), Take 1 Container by mouth., Disp: , Rfl:  . midodrine (PROAMATINE) 10 MG   tablet, Take by mouth., Disp: , Rfl:  . ondansetron (ZOFRAN, AS HYDROCHLORIDE,) 4 MG tablet, Take by mouth., Disp: , Rfl:  . pantoprazole (PROTONIX) 40 MG tablet, Take by mouth., Disp: , Rfl:  . warfarin (COUMADIN) 1 MG tablet *ANTICOAGULANT*, Take by mouth., Disp: , Rfl:  . benzonatate (TESSALON) 100 MG capsule, Take 1 capsule (100 mg total) by mouth 3 (three) times daily as needed for Cough., Disp: 30 capsule, Rfl: 1 . enoxaparin (LOVENOX) 100 mg/mL Syrg injection *ANTICOAGULANT*, INJECT 0.5 MLS (50 MG TOTAL) INTO THE SKIN DAILY., Disp: , Rfl: 0 . fludrocortisone (FLORINEF) 0.1 mg tablet, TAKE 2 TABLETS BY MOUTH TWICE A DAY, Disp: , Rfl: 0 . food supplemt, lactose-reduced (ENSURE) Liqd, Take by mouth., Disp: , Rfl:  .  furosemide (LASIX) 20 MG tablet, TAKE 1 TABLET EVERY OTHER DAY, Disp: , Rfl: 3 . KLOR-CON M20 20 mEq extended release tablet, TAKE 1 TABLET EVERY OTHER DAY, Disp: , Rfl: 3 . oxyCODONE (ROXICODONE) 5 MG immediate release tablet, TAKE 1 TABLET EVERY 4 HOURS AS NEEDED SEVERE PAIN, Disp: , Rfl: 0 . traMADol (ULTRAM) 50 mg tablet, TAKE 1 TABLET BY MOUTH EVERY 8 HOURS AS NEEDED FOR SEVERE PAIN, Disp: , Rfl: 0   A complete ROS was performed with pertinent positives/negatives noted in the HPI. The remainder of the ROS are negative.      Physical Exam:     There were no vitals taken for this visit.   Examination: She is thin but energetic. Mental status is appropriate. She hears well in conversational speech. Her voice is almost aphonic. Respirations unlabored. Ears are clear. Anterior nose clear. Oral cavity is moist and clear. Neck unremarkable.   Lungs: Clear to auscultation Heart: Regular rate and rhythm without murmurs Abdomen: Soft, active Extremities: Normal configuration Neurologic: Symmetric, grossly intact.       Impression & Plans:    Impression: LEFT vocal cord paralysis with severe voice dysfunction.   Plan: We will proceed with Radiesse augmentation next week. I spoke with one of her cardiologists, Dr. Susette Racer, who feels we are okay to stop her Coumadin temporarily preceding surgery. She can use her voice as early as possible. She needs to continue with more aggressive alimentation especially as she continues to lose pleural effusion fluid. Recheck here 4 weeks after surgery.      Flo Shanks 10/30/2015, 12:26 PM

## 2015-11-05 ENCOUNTER — Encounter (HOSPITAL_COMMUNITY): Payer: Self-pay | Admitting: Otolaryngology

## 2015-11-05 NOTE — Anesthesia Postprocedure Evaluation (Signed)
Anesthesia Post Note  Patient: Public relations account executive  Procedure(s) Performed: Procedure(s) (LRB): MICROLARYNGOSCOPY, RADIESSE VOCAL CORD AUGMENTATION (N/A)  Anesthesia Post Evaluation  Last Vitals:  Filed Vitals:   11/04/15 1107 11/04/15 1121  BP:  130/75  Pulse: 85 85  Temp:    Resp:      Last Pain:  Filed Vitals:   11/04/15 1145  PainSc: 0-No pain                 Diane [name] EDWARD

## 2015-11-06 ENCOUNTER — Telehealth: Payer: Self-pay | Admitting: *Deleted

## 2015-11-06 ENCOUNTER — Ambulatory Visit (HOSPITAL_COMMUNITY)
Admission: RE | Admit: 2015-11-06 | Discharge: 2015-11-06 | Disposition: A | Discharge status: Home / Self Care | Payer: BLUE CROSS/BLUE SHIELD | Source: Ambulatory Visit | Attending: Internal Medicine | Admitting: Internal Medicine

## 2015-11-06 VITALS — BP 106/72 | HR 93 | Wt 104.8 lb

## 2015-11-06 DIAGNOSIS — Z95810 Presence of automatic (implantable) cardiac defibrillator: Secondary | ICD-10-CM | POA: Diagnosis not present

## 2015-11-06 DIAGNOSIS — Z79899 Other long term (current) drug therapy: Secondary | ICD-10-CM | POA: Insufficient documentation

## 2015-11-06 DIAGNOSIS — I5042 Chronic combined systolic (congestive) and diastolic (congestive) heart failure: Secondary | ICD-10-CM

## 2015-11-06 DIAGNOSIS — R0781 Pleurodynia: Secondary | ICD-10-CM | POA: Insufficient documentation

## 2015-11-06 DIAGNOSIS — J948 Other specified pleural conditions: Secondary | ICD-10-CM | POA: Diagnosis not present

## 2015-11-06 DIAGNOSIS — J90 Pleural effusion, not elsewhere classified: Secondary | ICD-10-CM

## 2015-11-06 DIAGNOSIS — I5043 Acute on chronic combined systolic (congestive) and diastolic (congestive) heart failure: Secondary | ICD-10-CM | POA: Diagnosis not present

## 2015-11-06 DIAGNOSIS — Z7901 Long term (current) use of anticoagulants: Secondary | ICD-10-CM | POA: Insufficient documentation

## 2015-11-06 DIAGNOSIS — I480 Paroxysmal atrial fibrillation: Secondary | ICD-10-CM | POA: Insufficient documentation

## 2015-11-06 DIAGNOSIS — Z885 Allergy status to narcotic agent status: Secondary | ICD-10-CM | POA: Diagnosis not present

## 2015-11-06 DIAGNOSIS — Z9889 Other specified postprocedural states: Secondary | ICD-10-CM

## 2015-11-06 DIAGNOSIS — Z8674 Personal history of sudden cardiac arrest: Secondary | ICD-10-CM | POA: Diagnosis not present

## 2015-11-06 MED ORDER — IVABRADINE HCL 5 MG PO TABS: 2.5000 mg | ORAL_TABLET | Freq: Two times a day (BID) | ORAL | Status: DC

## 2015-11-06 MED ORDER — PREGABALIN 25 MG PO CAPS: 25.0000 mg | ORAL_CAPSULE | Freq: Every day | ORAL | Status: DC

## 2015-11-06 NOTE — Progress Notes (Addendum)
[name], female   DOB: 05-Oct-1960, 55 y.o.   MRN: [id number] [name], female   DOB: [birth year], 55 y.o.   MRN: [id number]   ADVANCED HF CLINIC NOTE   Date:  11/06/2015   ID:  Diane [name], DOB May 27, 1961, MRN [id number]  PCP:  Darrow Bussing, MD  Cardiologist:   Dr. Duke Salvia Electrophysiologist: Dr. Berton Mount   [name] is a 54 y.o. female who is referred to the HF Clinic by Dr. Duke Salvia for further evaluation of her chronic systolic and diastolic heart failure and possible infiltrative CM.  Says her symptoms started about 2 years ago with syncope and DOE. Finally referred to Dr. Duke Salvia in 12/16. Echocardiography revealed severe mitral regurgitation and a reduced LVEF (45-50%).  The MR was due to tethering of the posterior leaflet.  She was also noted to have grade 3 diastolic dysfunction with mid basal to mid inferolateral wall hypokinesis.  On 07/22/15 underwent repair of her mitral valve with a 26 mm Sorin Memo 3D annuloplasty ring on 07/22/15 by Dr. Cornelius Moras.  Intra-operatively she was noted to have a PDA with left to right shunting that was ligated in the OR. The left atrium was also clipped.   Her post operative course was initially unremarkable and she was discharged on post-op day 5.    On 08/05/15 she suffered a cardiac arrest at home and was resuscitated by EMS.  The initial rhythm was unclear but she had VT upon arrival in the ED, which was followed by atrial fibrillation.  She was treated with hypothermia and had prolonged hospital course. Fortunately she has no residual cognitive deficits.  During that hospitalization she underwent cardiac MRI with LVEF 45% with hypokinesis of the distal septum and apex.  There was diffuse, delayed enhancement in the subendocardium and mid epicardial region, most prominent in the inferior wall.  This was concerning for myocarditis or infiltrative cardiomyopathy.  Her EKG showed QT prolongation and she had an  ICD placed for secondary prevention.  Her hospitalization was delayed by severe orthostatic hypotension and recurrent syncope requiring high doses of florinef and midodrine which were weaned down.  She was discharged home but was readmitted with volume overload on 2/16.  Echo showed LVEF 35% with diffuse hypokinesis and her mitral valve was functioning well.  She also underwent L thoracentesis with removal of 1.4L and received IV lasix.  She was discharged home on 2/16 and followed up in clinic on 2/23.  At that time she reported some dizziness and was hypotensive to 84/58.   She underwent Pleurex catheter placement for recurrent left pleural effusion on 10/20/16.   She returns for f/u. Continues to feel very weak. Draining pleurex 3x/week. About 300-400cc at a time. Now having severe pain at site and makes it hard to drain. Breathing better after it is drawn off. Does not want to take narcotics due to n/v. Appetite poor. No edema. + Dizziness. Not taking any lasix. Underwent vocal cord medialization on Tuesday.       Past Medical History  Diagnosis Date  . Severe mitral regurgitation 07/03/2015  . Chronic combined systolic and diastolic CHF (congestive heart failure) (HCC) 07/03/2015    Grade 3 diastolic dysfunction.  LVEF 40-45% 05/2015.  Marland Kitchen Aortic aneurysm (HCC)   . Patent ductus arteriosus 07/16/2015  . Depression     SOME DEPRESSION (ON MEDS FOR 2 YRS) WHEN HER MOTHER PASSED  . 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  07/22/2015    26 mm Sorin Memo 3D ring annuloplasty with ligation of patent ductus arteriosus and clipping of LA appendage  . Cardiac arrest (HCC)   . AICD (automatic cardioverter/defibrillator) present   . Shortness of breath dyspnea   . Anxiety   . Arthritis   . Anemia     Past Surgical History  Procedure Laterality Date  . Ovarian cyst removal    . Tee without cardioversion N/A 07/07/2015    Procedure: TRANSESOPHAGEAL ECHOCARDIOGRAM (TEE);   Surgeon: Chilton Si, MD;  Location: [facility] ENDOSCOPY;  Service: Cardiovascular;  Laterality: N/A;  . Cardiac catheterization N/A 07/08/2015    Procedure: Right/Left Heart Cath and Coronary Angiography;  Surgeon: Marykay Lex, MD;  Location: [facility] INVASIVE CV LAB;  Service: Cardiovascular;  Laterality: N/A;  . Mitral valve repair N/A 07/22/2015    Procedure: MITRAL VALVE REPAIR;  Surgeon: Purcell Nails, MD;  Location: [facility] OR;  Service: Open Heart Surgery;  Laterality: N/A;  26 Sorin Memo 3D  . Tee without cardioversion N/A 07/22/2015    Procedure: TRANSESOPHAGEAL ECHOCARDIOGRAM (TEE);  Surgeon: Purcell Nails, MD;  Location: [facility] OR;  Service: Open Heart Surgery;  Laterality: N/A;  . Patent ductus arterious repair N/A 07/22/2015    Procedure: PATENT DUCTUS ARTERIOSUS (PDA) ligation;  Surgeon: Purcell Nails, MD;  Location: [facility] OR;  Service: Open Heart Surgery;  Laterality: N/A;  . Clipping of atrial appendage  07/22/2015    Procedure: CLIPPING OF LEFT ATRIAL APPENDAGE;  Surgeon: Purcell Nails, MD;  Location: [facility] OR;  Service: Open Heart Surgery;;  35 Atriclip Pro  . Ep implantable device N/A 08/15/2015    Procedure: ICD Implant;  Surgeon: Will Jorja Loa, MD;  Location: [facility] INVASIVE CV LAB;  Service: Cardiovascular;  Laterality: N/A;  . Colonoscopy    . Chest tube insertion Left 10/21/2015    Procedure: INSERTION left PLEURAL DRAINAGE CATHETER;  Surgeon: Purcell Nails, MD;  Location: [facility] OR;  Service: Thoracic;  Laterality: Left;  Marland Kitchen Microlaryngoscopy with co2 laser and excision of vocal cord lesion N/A 11/04/2015    Procedure: MICROLARYNGOSCOPY, RADIESSE VOCAL CORD AUGMENTATION;  Surgeon: Flo Shanks, MD;  Location: [facility] OR;  Service: ENT;  Laterality: N/A;     Current Outpatient Prescriptions  Medication Sig Dispense Refill  . acetaminophen (TYLENOL) 325 MG tablet Take 650 mg by mouth every 6 (six) hours as needed.    . benzonatate (TESSALON) 100 MG capsule Take 100 mg by mouth 3 (three) times  daily as needed for cough.    . midodrine (PROAMATINE) 10 MG tablet Take 1 tablet (10 mg total) by mouth 3 (three) times daily. 30 tablet 5  . warfarin (COUMADIN) 1 MG tablet Take 2-3 mg by mouth daily. Take 2 mg daily except 3 mg on Monday and Friday    . warfarin (COUMADIN) 1 MG tablet Take 1 mg by mouth daily. 2 mg every day, and 3 mg Monday, Wednesday, Friday     No current facility-administered medications for this encounter.    Allergies:   Vicodin; Adhesive; Oxycodone; and Silicone    Social History:  The patient  reports that she has never smoked. She has never used smokeless tobacco. She reports that she does not drink alcohol or use illicit drugs.   Family History:  The patient's family history includes Hypertension in her mother.    ROS:  Please see the history of present illness.  Otherwise, review of systems are positive for none.   All  other systems are reviewed and negative.    PHYSICAL EXAM: VS:  BP 106/72 mmHg  Pulse 93  Wt 104 lb 12.8 oz (47.537 kg)  SpO2 98% , BMI There is no weight on file to calculate BMI. GENERAL:  Weak appearing   HEENT:  Pupils equal round and reactive, fundi not visualized, oral mucosa unremarkable.  Hoarse voice NECK:  JVP 5-6 LYMPHATICS:  No cervical adenopathy LUNGS:  Clear.  Pleurex cat site ok  HEART:  RRR.  PMI not displaced or sustained,S1 and S2 within normal limits, no S3, no S4, no clicks, no rubs, no murmurs ABD:  Flat, positive bowel sounds normal in frequency in pitch, no bruits, no rebound, no guarding, no midline pulsatile mass, no hepatomegaly, no splenomegaly EXT:  2 plus pulses throughout, trace edema, no cyanosis no clubbing SKIN:  No rashes no nodules NEURO:  Cranial nerves II through XII grossly intact, motor grossly intact throughout [facility]:  Cognitively intact, oriented to person place and time   PFT 11/05/14:  FEV1  1.9 L FVC : 2.4 L FEV1 and FVC are reduced, but the FEV1/FVC ratio is normal.  Following  adminitration of bronchodilator there was no significant response.  Consistent with minimal obstructive airway disease.    LHC 07/08/15:  Severe mitral regurgitation with large V waves and 4+ MR  There is mild to moderate left ventricular systolic dysfunction. With cardiac output/index moderate-severely reduced.  Angiographically normal coronary arteries   Recent Labs: 08/05/2015: TSH 2.455 09/03/2015: Magnesium 1.9 10/13/2015: B Natriuretic Peptide 438.6* 10/21/2015: ALT 19 11/04/2015: BUN 13; Creatinine, Ser 0.53; Hemoglobin 12.4; Platelets 228; Potassium 4.4; Sodium 138    Lipid Panel    Component Value Date/Time   CHOL 181 09/04/2015 0000      Wt Readings from Last 3 Encounters:  11/04/15 102 lb (46.267 kg)  11/03/15 102 lb (46.267 kg)  10/21/15 101 lb (45.813 kg)      ASSESSMENT AND PLAN:  1) Acute on  Chronic systolic and diastolic heart failure: --EF 35% on echo; 40% on cMRI. S/p ICD for SCD. NYHA III-IIIb --Volume status ok off lasix. Take as needed to maintain weight 105 --MRI reviewed and is suggestive of possible infiltrative process or myocarditis. I actually favor myocarditis. SPEP/UPEP negative as well as serologies for CTD. Check ferritin.  I will review with Drs. Delton See and [place]. Unfortunately cannot repeat MRI due to ICD. Eventually may need to consider EM biopsy but I do not think this is emergent. And she doe not want to pursue at this time.  --She continues to require midodrine for BP support, so no beta blocker or ACE-I/ARB at this time. t --Start corlanor 2.5 bid  2) Recurrent left pleural effusion ----Volume status improved with lasix.  -- Now s/p pleurex catheter. Continues to drain. Followed by Dr. Cornelius Moras and [facility].   3) Mitral regurgitation s/p mitral valve repair:  Stable s/p MV repair  4) Paroxysmal atrial fibrillation: Remains in sinus rhythm.  LA appendage was clipped in surgery.  The patients CHA2DS2-VASc Score and unadjusted Ischemic Stroke  Rate (% per year) is equal to 2.2 % stroke rate/year from a score of 2 . Continue warfarin.   5) Pleuritc pain  --start Lyrica  at night. Can increase as needed.       Migdalia Dk, MD  11/06/2015 3:07 PM

## 2015-11-06 NOTE — Patient Instructions (Signed)
Start Corlanor 2.5 mg (1/2 tab) Twice daily   Start Lyrica 25 mg daily at bedtime  Your physician recommends that you schedule a follow-up appointment in: 4 weeks

## 2015-11-06 NOTE — Telephone Encounter (Signed)
Diane [name], Diane [name]'s [name] wanted me to call Diane so she could give me an update regarding Diane [name].  She did not go to the ED as I suggested, but went to the cardiologist's office for the chest/back discomfort and Lyrica was prescribed. She felt I has dismissed Diane [name] and Diane needs today.  I explained that the way Diane [name] described Diane symptoms, I felt she needed to be seen in the ED to r/o a pulmonary embolus. She was also upset that I didn't call Diane [name] after the [facility] had drained Diane earlier this week and she had experienced pain for the first time. I explained that this was not unusual and may not happen again. I feel my actions and response was appropriate in this instance.

## 2015-11-06 NOTE — Telephone Encounter (Signed)
Yesterday, Diane [name]'s HHN called to relate the severe pain that she experienced when her pleurX catheter was drained. The drainage was blood tinged and only a small amount could be drained before she experienced the pain in her side. Today her sister called today and said she needed to see someone because she was having an anxiety attack and having pain.  I called her this morning and she says that after she had a vocal cord procedure on the 18th, that she has had constant pain in her back and trouble breathing.  I suggested she be seen in the ED and she reluctantly agreed.

## 2015-11-06 NOTE — Progress Notes (Signed)
Advanced Heart Failure Medication Review by a Pharmacist  Does the patient  feel that his/her medications are working for him/her?  yes  Has the patient been experiencing any side effects to the medications prescribed?  no  Does the patient measure his/her own blood pressure or blood glucose at home?  yes   Does the patient have any problems obtaining medications due to transportation or finances?   no  Understanding of regimen: good Understanding of indications: good Potential of compliance: excellent Patient understands to avoid NSAIDs. Patient understands to avoid decongestants.  Issues to address at subsequent visits: depression   Pharmacist comments: Diane [name] is a 55 yo woman here for f/u in HF clinic. She has been feeling sad and depressed lately especially after having been hospitalized for a prolonged period. She also states that she was previously in great, healthy state several years ago and is now depressed because of how sick she feels. Previously prescribed bupropion but unable to get recent refills since it was started during a hospital admission and not followed by any providers outpatient.  She also has Pleurex catheter in place drained several times a week. However has been having increased pain d/t inability to tolerate opioid prescriptions, having intolerable vomiting after taking. She was therefore unable to tolerate her last drainage session. Also continues to have issues with dizziness, currently on midodrine. [name] attributes her dizziness d/t poor appetite from N/V, pain, and feeling unwell overall.   INR checked several days ago subtherapeutic, however recently restarted warfarin after vocal cord surgery.     Diane [name], [place].D., BCPS PGY2 Cardiology Pharmacy Resident Pager: [phone]   Time with patient: 10 min Preparation and documentation time: 10 min Total time: 20 min

## 2015-11-07 ENCOUNTER — Telehealth (HOSPITAL_COMMUNITY): Payer: Self-pay | Admitting: *Deleted

## 2015-11-07 NOTE — Telephone Encounter (Signed)
Pt called to ask if she was suppose to stop her Midodrine since we started her on Corlanor, or if she needed to take both.    Advised pt she is to continue the Midodrine and start Corlanor, she is agreeable

## 2015-11-12 ENCOUNTER — Encounter: Payer: Self-pay | Admitting: Cardiology

## 2015-11-12 ENCOUNTER — Ambulatory Visit (INDEPENDENT_AMBULATORY_CARE_PROVIDER_SITE_OTHER): Payer: BLUE CROSS/BLUE SHIELD | Admitting: Cardiology

## 2015-11-12 VITALS — BP 110/78 | HR 87 | Ht 61.0 in | Wt 103.4 lb

## 2015-11-12 DIAGNOSIS — I5022 Chronic systolic (congestive) heart failure: Secondary | ICD-10-CM | POA: Diagnosis not present

## 2015-11-12 LAB — CUP PACEART INCLINIC DEVICE CHECK
Battery Remaining Longevity: 132 mo
Brady Statistic AS VS Percent: 99.8 %
Date Time Interrogation Session: 20170426153207
HighPow Impedance: 60 Ohm
Implantable Lead Implant Date: 20170127
Implantable Lead Location: 753860
Lead Channel Pacing Threshold Amplitude: 0.75 V
Lead Channel Pacing Threshold Amplitude: 0.75 V
Lead Channel Pacing Threshold Pulse Width: 0.4 ms
Lead Channel Sensing Intrinsic Amplitude: 8 mV
Lead Channel Setting Pacing Amplitude: 1.5 V
Lead Channel Setting Pacing Amplitude: 2 V
Lead Channel Setting Pacing Pulse Width: 0.4 ms
Lead Channel Setting Sensing Sensitivity: 0.3 mV
MDC IDC LEAD IMPLANT DT: 20170127
MDC IDC LEAD LOCATION: 753859
MDC IDC MSMT LEADCHNL RA IMPEDANCE VALUE: 418 Ohm
MDC IDC MSMT LEADCHNL RA PACING THRESHOLD PULSEWIDTH: 0.4 ms
MDC IDC MSMT LEADCHNL RA SENSING INTR AMPL: 1.8 mV
MDC IDC MSMT LEADCHNL RV IMPEDANCE VALUE: 418 Ohm
MDC IDC STAT BRADY AP VP PERCENT: 0.1 % — AB
MDC IDC STAT BRADY AP VS PERCENT: 0.2 %
MDC IDC STAT BRADY AS VP PERCENT: 0.1 % — AB

## 2015-11-12 NOTE — Patient Instructions (Signed)
Medication Instructions:  Your physician recommends that you continue on your current medications as directed. Please refer to the Current Medication list given to you today.   Labwork: None ordered   Testing/Procedures: None ordered   Follow-Up: Your physician wants you to follow-up in: 9 months with Dr Perrin Smack will receive a reminder letter in the mail two months in advance. If you don't receive a letter, please call our office to schedule the follow-up appointment.  Remote monitoring is used to monitor your  ICD from home. This monitoring reduces the number of office visits required to check your device to one time per year. It allows [place] to keep an eye on the functioning of your device to ensure it is working properly. You are scheduled for a device check from home on 02/11/16. You may send your transmission at any time that day. If you have a wireless device, the transmission will be sent automatically. After your physician reviews your transmission, you will receive a postcard with your next transmission date.    Any Other Special Instructions Will Be Listed Below (If Applicable).     If you need a refill on your cardiac medications before your next appointment, please call your pharmacy.

## 2015-11-12 NOTE — Progress Notes (Signed)
Electrophysiology Office Note   Date:  11/12/2015   ID:  Diane [name], DOB Aug 30, 1960, MRN [id number]  PCP:  Darrow Bussing, MD  Cardiologist:  Duke Salvia Primary Electrophysiologist:  Leona Alen Jorja Loa, MD    Chief Complaint  Patient presents with  . ICD check    3 month post implant     History of Present Illness: Diane [name] is a 55 y.o. female who presents today for electrophysiology evaluation.   She has a history of mitral valve repair for severe mitral regurgitation, ligated PDA due to left-to-right shunting, and clipping of the left atrial appendage. She was admitted on 08/05/15 with an unwitnessed cardiac arrest with a rhythm of V. tach and PDA. She was resuscitated and admitted to the ICU and started on hypothermia protocol. She had an AICD placed during that hospital stay. Her hospital stay was complicated by persistent orthostatic hypotension. She was initially started on Midrin, Florinef, and an abdominal binder with TED hose. At time of discharge, her blood pressure had stabilized. She underwent cardiac MRI Lorrin Nawrot in the hospital showed an EF 45% with diffuse hypokinesis of the distal septum and apex. There was diffuse delayed enhancement in the subendocardium and mid epicardial region most prominent in the inferior wall. She was readmitted on 2/16 for volume overload. An echocardiogram at that time showed an EF of 35% with diffuse hypokinesis. She underwent thoracentesis with removal of 1.4 L of fluid and received IV Lasix. She underwent Pleurx catheter placement for recurrent left pleural effusion on 10/20/16. She was seen in heart failure clinic on 11/06/15. At that time it was determined that she continued to need Midrin for blood pressure support, and beta blocker or ARB/ACE inhibitor were not started. She also has atrial fibrillation. Her left atrial plantar was clipped during surgery. She is on Coumadin.   Today, she denies symptoms of palpitations, orthopnea, PND, lower  extremity edema, claudication, dizziness, presyncope, syncope, bleeding, or neurologic sequela. The patient is tolerating medications without difficulties and is otherwise without complaint today.  She feels like the chest pain is likely due to the Pleurx catheter. She continues to have it drained, and the pain is worse right after drainage.   Past Medical History  Diagnosis Date  . Severe mitral regurgitation 07/03/2015  . Chronic combined systolic and diastolic CHF (congestive heart failure) (HCC) 07/03/2015    Grade 3 diastolic dysfunction.  LVEF 40-45% 05/2015.  Marland Kitchen Aortic aneurysm (HCC)   . Patent ductus arteriosus 07/16/2015  . Depression     SOME DEPRESSION (ON MEDS FOR 2 YRS) WHEN HER MOTHER PASSED  . 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 07/22/2015    26 mm Sorin Memo 3D ring annuloplasty with ligation of patent ductus arteriosus and clipping of LA appendage  . Cardiac arrest (HCC)   . AICD (automatic cardioverter/defibrillator) present   . Shortness of breath dyspnea   . Anxiety   . Arthritis   . Anemia    Past Surgical History  Procedure Laterality Date  . Ovarian cyst removal    . Tee without cardioversion N/A 07/07/2015    Procedure: TRANSESOPHAGEAL ECHOCARDIOGRAM (TEE);  Surgeon: Chilton Si, MD;  Location: [facility] ENDOSCOPY;  Service: Cardiovascular;  Laterality: N/A;  . Cardiac catheterization N/A 07/08/2015    Procedure: Right/Left Heart Cath and Coronary Angiography;  Surgeon: Marykay Lex, MD;  Location: [facility] INVASIVE CV LAB;  Service: Cardiovascular;  Laterality: N/A;  . Mitral valve repair N/A 07/22/2015    Procedure: MITRAL  VALVE REPAIR;  Surgeon: Purcell Nails, MD;  Location: [facility] OR;  Service: Open Heart Surgery;  Laterality: N/A;  26 Sorin Memo 3D  . Tee without cardioversion N/A 07/22/2015    Procedure: TRANSESOPHAGEAL ECHOCARDIOGRAM (TEE);  Surgeon: Purcell Nails, MD;  Location: [facility] OR;  Service: Open Heart Surgery;  Laterality: N/A;  .  Patent ductus arterious repair N/A 07/22/2015    Procedure: PATENT DUCTUS ARTERIOSUS (PDA) ligation;  Surgeon: Purcell Nails, MD;  Location: [facility] OR;  Service: Open Heart Surgery;  Laterality: N/A;  . Clipping of atrial appendage  07/22/2015    Procedure: CLIPPING OF LEFT ATRIAL APPENDAGE;  Surgeon: Purcell Nails, MD;  Location: [facility] OR;  Service: Open Heart Surgery;;  35 Atriclip Pro  . Ep implantable device N/A 08/15/2015    Procedure: ICD Implant;  Surgeon: Ishaaq Penna Jorja Loa, MD;  Location: [facility] INVASIVE CV LAB;  Service: Cardiovascular;  Laterality: N/A;  . Colonoscopy    . Chest tube insertion Left 10/21/2015    Procedure: INSERTION left PLEURAL DRAINAGE CATHETER;  Surgeon: Purcell Nails, MD;  Location: [facility] OR;  Service: Thoracic;  Laterality: Left;  Marland Kitchen Microlaryngoscopy with co2 laser and excision of vocal cord lesion N/A 11/04/2015    Procedure: MICROLARYNGOSCOPY, RADIESSE VOCAL CORD AUGMENTATION;  Surgeon: Flo Shanks, MD;  Location: [facility] OR;  Service: ENT;  Laterality: N/A;     Current Outpatient Prescriptions  Medication Sig Dispense Refill  . ivabradine (CORLANOR) 5 MG TABS tablet Take 0.5 tablets (2.5 mg total) by mouth 2 (two) times daily with a meal. 30 tablet 6  . midodrine (PROAMATINE) 10 MG tablet Take 1 tablet (10 mg total) by mouth 3 (three) times daily. 30 tablet 5  . pregabalin (LYRICA) 25 MG capsule Take 1 capsule (25 mg total) by mouth at bedtime. 30 capsule 2  . warfarin (COUMADIN) 1 MG tablet Take 2-3 mg by mouth daily. Take 2 mg daily except 3 mg on Monday and Friday    . warfarin (COUMADIN) 1 MG tablet Take 1 mg by mouth daily. 2 mg every day, and 3 mg Monday, Wednesday, Friday     No current facility-administered medications for this visit.    Allergies:   Vicodin; Adhesive; Oxycodone; and Silicone   Social History:  The patient  reports that she has never smoked. She has never used smokeless tobacco. She reports that she does not drink alcohol or use illicit drugs.    Family History:  The patient's family history includes Hypertension in her mother.    ROS:  Please see the history of present illness.   Otherwise, review of systems is positive for  Weight loss, appetite change, fatigue, chest pressure, cough, shortness of breath with exertion, Depression, dizziness, headaches.   All other systems are reviewed and negative.    PHYSICAL EXAM: VS:  BP 110/78 mmHg  Pulse 87  Ht 5\' 1"  (1.549 m)  Wt 103 lb 6.4 oz (46.902 kg)  BMI 19.55 kg/m2 , BMI Body mass index is 19.55 kg/(m^2). GEN: Well nourished, well developed, in no acute distress HEENT: normal Neck: no JVD, carotid bruits, or masses Cardiac: RRR; no murmurs, rubs, or gallops,no edema  Respiratory:  clear to auscultation bilaterally, normal work of breathing GI: soft, nontender, nondistended, + BS MS: no deformity or atrophy Skin: warm and dry,  device pocket is well healed Neuro:  Strength and sensation are intact Psych: euthymic mood, full affect  EKG:  EKG is ordered today. The ekg ordered today  shows sinus rhythm, poor R wave progression, LAFB, prolonged QTc   Device interrogation is reviewed today in detail.  See PaceArt for details.   Recent Labs: 08/05/2015: TSH 2.455 09/03/2015: Magnesium 1.9 10/13/2015: B Natriuretic Peptide 438.6* 10/21/2015: ALT 19 11/04/2015: BUN 13; Creatinine, Ser 0.53; Hemoglobin 12.4; Platelets 228; Potassium 4.4; Sodium 138    Lipid Panel     Component Value Date/Time   CHOL 181 09/04/2015 0000     Wt Readings from Last 3 Encounters:  11/12/15 103 lb 6.4 oz (46.902 kg)  11/06/15 104 lb 12.8 oz (47.537 kg)  11/04/15 102 lb (46.267 kg)      Other studies Reviewed: Additional studies/ records that were reviewed today include: TTE 09/04/15, MRI  08/14/15 Review of the above records today demonstrates:  - Left ventricle: The cavity size was normal. There was mild  concentric hypertrophy. Systolic function was moderately reduced.  The estimated  ejection fraction was in the range of 35% to 40%.  Diffuse hypokinesis. - Aortic valve: Trileaflet; mildly thickened, mildly calcified  leaflets. There was trivial regurgitation. - Mitral valve: S/P mitral valve repair with annuloplasty ring. The  findings are consistent with mild stenosis. Valve area by  continuity equation (using LVOT flow): 1.34 cm^2. - Tricuspid valve: There was mild regurgitation. - Pulmonary arteries: PA peak pressure: 39 mm Hg (S). - Pericardium, extracardiac: There was a left pleural effusion.  1) Mildly asymmetric septal hypertrophy 15 mm. Distal septal apical hypokinesis EF 45%  2) Diffuse delayed gadolinium uptake in the subendocardial and mid epicardial areas especially marked in the inferior wall  3) S/P mitral valve repair with no residual MR and annuloplasty ring  4) Atri-Clip of the LAA with no flow  5) No residual PDA flow  6) Mild LAE  7) Small pericardial effusion  8) Large left pleural effusion  ASSESSMENT AND PLAN:  1.  Acute on chronic systolic and diastolic heart failure: ICD placed for VT arrest.   She has had no other VT on her device device functioning appropriately. She does have some chest pain, but with Pleurx catheter it is possible that she is having pleuritic pain from that. We'll continue to monitor.  2. Mitral regurgitation: stable s/p repair  3. Paroxysmal atrial fibrillation: on coumadin.  No recurrence at this time.  LAA clipped during surgery.  This patients CHA2DS2-VASc Score and unadjusted Ischemic Stroke Rate (% per year) is equal to 2.2 % stroke rate/year from a score of 2  Above score calculated as 1 point each if present [CHF, HTN, DM, Vascular=MI/PAD/Aortic Plaque, Age if 65-74, or Female] Above score calculated as 2 points each if present [Age > 75, or Stroke/TIA/TE]     Current medicines are reviewed at length with the patient today.   The patient does not have concerns regarding her medicines.   The following changes were made today:  none  Labs/ tests ordered today include:  No orders of the defined types were placed in this encounter.     Disposition:   FU with Luv Mish 9 months  Signed, Creasie Lacosse Jorja Loa, MD  11/12/2015 2:45 PM     [facility] [address] [place] [address] [phone] (office) [phone] (fax)

## 2015-11-14 ENCOUNTER — Encounter: Payer: Self-pay | Admitting: Cardiovascular Disease

## 2015-11-14 ENCOUNTER — Ambulatory Visit (INDEPENDENT_AMBULATORY_CARE_PROVIDER_SITE_OTHER): Payer: BLUE CROSS/BLUE SHIELD | Admitting: Cardiovascular Disease

## 2015-11-14 VITALS — BP 120/74 | HR 93 | Ht 61.0 in | Wt 103.6 lb

## 2015-11-14 DIAGNOSIS — Z9889 Other specified postprocedural states: Secondary | ICD-10-CM | POA: Diagnosis not present

## 2015-11-14 DIAGNOSIS — I5042 Chronic combined systolic (congestive) and diastolic (congestive) heart failure: Secondary | ICD-10-CM | POA: Diagnosis not present

## 2015-11-14 DIAGNOSIS — F32A Depression, unspecified: Secondary | ICD-10-CM

## 2015-11-14 DIAGNOSIS — F329 Major depressive disorder, single episode, unspecified: Secondary | ICD-10-CM | POA: Diagnosis not present

## 2015-11-14 DIAGNOSIS — I951 Orthostatic hypotension: Secondary | ICD-10-CM | POA: Diagnosis not present

## 2015-11-14 MED ORDER — DULOXETINE HCL 30 MG PO CPEP: 30.0000 mg | ORAL_CAPSULE | Freq: Every day | ORAL | Status: DC

## 2015-11-14 MED ORDER — MIDODRINE HCL 10 MG PO TABS: ORAL_TABLET | ORAL | Status: DC

## 2015-11-14 NOTE — Progress Notes (Signed)
Cardiology Office Note   Date:  11/15/2015   ID:  Diane [name], DOB 1961/01/09, MRN [id number]  PCP:  Darrow Bussing, MD  Cardiologist:   Chilton Si, MD  Electrophysiologist: Dr. Berton Mount  Chief Complaint  Patient presents with  . Follow-up    2 months  pt c/o SOB on minimal exertion, dizziness in the mornings--"mornings are rough," fatigue--wants to sleep all day     [name] is a 55 y.o. female who presents for follow up on mitral valve repair and chronic systolic and diastolic heart failure.  Interval History 11/14/15: Since her last appointment Diane [name] had a left pleurex catheter inserted for recurrent pleural effusion.  She drains 300-450 mL three times per week.  She has also followed up with Dr. Gala Romney who thinks her MRI may be consistent with myocarditis rather than an infiltrative process.  She was started on corlanor 2.5mg  bid on 4/20.  She was having severe pain from the pleurex catheter and was started on Lyrica.  She first started the ivabradine and three days later started the Lyrica.  Since then she has been feeling very fatiged.  She finds herself wanting to sleep throughout the day.  She has also been feeling very sad.  She used to get up at 5am ready to start her day.  Now she is sleeping until 7.  She has also been feeling more tired and dizzy.  This week she got very tired while walking down the stops and had to stop and sit in the middle of the stairs for 20 minutes.  She started feeling more dizzy since starting both Lyrica and ivabradine.  The Lyrica really helps with the pain from her chest tube.  She was having pain under her defibrillator.  The Lyrica has helped with that as well.    Diane [name] has also noticed increased dizziness in the morning.  It improves throughout the day.  She takes midodrine at 7am, 12pm and 7 pm.  She denies syncope but notes presyncope.  Interval History 09/16/15: Diane [name] underwent repair of her  mitral valve with a 26 mm Sorin Memo 3D annuloplasty ring on 07/22/15.  Intra-operatively she was noted to have a PDA with left to right shunting that was ligated in the OR. The left atrium was also clipped.   Her post operative course was initially unremarkable and she was discharged on post-op day 5.  On 1/17 she suffered a cardiac arrest at home and was resuscitated by EMS.  The initial rhythm was unclear but she had VT upon arrival in the ED, which was followed by atrial fibrillation.  She was treated with hypothermia and has no residual cognitive deficits.  During that hospitalization she underwent cardiac MRI with LVEF 45% with hypokinesis of the distal septum and apex.  There was diffuse, delayed enhancement in the subendocardium and mid epicardial region, most prominent in the inferior wall.  This was concerning for myocarditis or infiltrative cardiomyopathy.  Her EKG showed QT prolongation and she had an ICD placed for secondary prevention.  Her hospitalization was delayed by severe orthostatic hypotensiodn and recurrent syncope requiring high doses of florinef and midodrine.  She was discharged home but was readmitted with volume overload on 2/16.  Echo showed LVEF 35-40% with diffuse hypokinesis and her mitral valve was functioning well.  She also underwent L thoracentesis with removal of 1.4L and received IV lasix.  She was discharged home on 2/16 and followed up in clinic  on 2/23.  At that time she reported some dizziness and was hypotensive to 84/58.   Diane [name] has been doing well since she saw Azalee Course, PA-C one week ago.  She had a CXR that showed some reaccumulation of her L pleural effusion.  CBC was negative for infection. She notes some mild fatigue and dizziness in the AM that improves throughout the day.  Her appetite has  Improved and she is now eating 3 meals each day without using any nausea medication.  She looks forward to starting physical therapy and occupational therapy this week.   She continues to have hoarseness and a chronic, non-productive cough.  She denies lower extremity edema, orthopnea or PND.  Her breathing is slowly improving.    Interval History 12/15.16: Diane [name] underwent echocardiography that revealed severe mitral regurgitation and a reduced LVEF (45-50%).  The MR was due to tethering of the posterior leaflet.  She was also noted to have grade 3 diastolic dysfunction with mid basal to mid inferolateral wall hypokinesis.  Diane [name] continues to feel poorly.  She has constant coughing that makes it hard for her to breathe.  She also notes lower extremity edema.  She has been limiting her work because she can no longer stand all day.  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  She is unable to do a full day's work without extreme fatigue.  She also has constant chest discomfort.  History of Present Illness 05/29/15: Diane [name] reports that her symptoms began approximately 1-1/2 years ago. In that time. She's had 2 episodes of syncope lasting up to 15 minutes. She's had lightheadedness and dizziness upon standing and lasts may have the most severe episode that resulted in syncope. She was evaluated in the emergency department, at which time her blood pressure and heart rate were normal.  Cardiac enzymes were negative and her EKG showed borderline QT prolongation, but was otherwise unremarkable.  D-dimer was negative. She was referred to follow-up with her primary care physician as well as cardiology. In the interim she also saw a neurologist, who did an MRI of the brain that was unremarkable.  She had an EEG to evaluate this event as well as recurrent episodes of right facial droop.  EEG was unremarkable.  Diane [name] has noted shortness of breath and lightheadedness when walking up an incline.  She has to sit down in order to avoid passing out.  These episodes are associated with chest tightness.  The episodes last for a few minutes and improve with  rest.  The tightness is 5-6/10 in severity.  It is sometimes associated with nausea but no diaphoresis. She has been unable to exercise due to these symptoms.  She had an exercise Myoview in January 2015 that was negative for ischemia.  She denies palpitations, lower extremity edema, orthopnea or PND.  Ms. Youtz is concerned because a friend had similar symptoms and was ultimately diagnosed with valvular heart disease requiring surgical intervention.  Ms. Sahakian also notes that her taste buds have changed. She no longer like the taste of salty or sweet things. She's also had a cough for 4 years that is nonproductive and not associated with fever or chills.   Past Medical History  Diagnosis Date  . Severe mitral regurgitation 07/03/2015  . Chronic combined systolic and diastolic CHF (congestive heart failure) (HCC) 07/03/2015    Grade 3 diastolic dysfunction.  LVEF 40-45% 05/2015.  Marland Kitchen Aortic aneurysm (HCC)   .  Patent ductus arteriosus 07/16/2015  . Depression     SOME DEPRESSION (ON MEDS FOR 2 YRS) WHEN HER MOTHER PASSED  . 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 07/22/2015    26 mm Sorin Memo 3D ring annuloplasty with ligation of patent ductus arteriosus and clipping of LA appendage  . Cardiac arrest (HCC)   . AICD (automatic cardioverter/defibrillator) present   . Shortness of breath dyspnea   . Anxiety   . Arthritis   . Anemia     Past Surgical History  Procedure Laterality Date  . Ovarian cyst removal    . Tee without cardioversion N/A 07/07/2015    Procedure: TRANSESOPHAGEAL ECHOCARDIOGRAM (TEE);  Surgeon: Chilton Si, MD;  Location: [facility] ENDOSCOPY;  Service: Cardiovascular;  Laterality: N/A;  . Cardiac catheterization N/A 07/08/2015    Procedure: Right/Left Heart Cath and Coronary Angiography;  Surgeon: Marykay Lex, MD;  Location: [facility] INVASIVE CV LAB;  Service: Cardiovascular;  Laterality: N/A;  . Mitral valve repair N/A 07/22/2015    Procedure:  MITRAL VALVE REPAIR;  Surgeon: Purcell Nails, MD;  Location: [facility] OR;  Service: Open Heart Surgery;  Laterality: N/A;  26 Sorin Memo 3D  . Tee without cardioversion N/A 07/22/2015    Procedure: TRANSESOPHAGEAL ECHOCARDIOGRAM (TEE);  Surgeon: Purcell Nails, MD;  Location: [facility] OR;  Service: Open Heart Surgery;  Laterality: N/A;  . Patent ductus arterious repair N/A 07/22/2015    Procedure: PATENT DUCTUS ARTERIOSUS (PDA) ligation;  Surgeon: Purcell Nails, MD;  Location: [facility] OR;  Service: Open Heart Surgery;  Laterality: N/A;  . Clipping of atrial appendage  07/22/2015    Procedure: CLIPPING OF LEFT ATRIAL APPENDAGE;  Surgeon: Purcell Nails, MD;  Location: [facility] OR;  Service: Open Heart Surgery;;  35 Atriclip Pro  . Ep implantable device N/A 08/15/2015    Procedure: ICD Implant;  Surgeon: Will Jorja Loa, MD;  Location: [facility] INVASIVE CV LAB;  Service: Cardiovascular;  Laterality: N/A;  . Colonoscopy    . Chest tube insertion Left 10/21/2015    Procedure: INSERTION left PLEURAL DRAINAGE CATHETER;  Surgeon: Purcell Nails, MD;  Location: [facility] OR;  Service: Thoracic;  Laterality: Left;  Marland Kitchen Microlaryngoscopy with co2 laser and excision of vocal cord lesion N/A 11/04/2015    Procedure: MICROLARYNGOSCOPY, RADIESSE VOCAL CORD AUGMENTATION;  Surgeon: Flo Shanks, MD;  Location: [facility] OR;  Service: ENT;  Laterality: N/A;     Current Outpatient Prescriptions  Medication Sig Dispense Refill  . ivabradine (CORLANOR) 5 MG TABS tablet Take 0.5 tablets (2.5 mg total) by mouth 2 (two) times daily with a meal. 30 tablet 6  . midodrine (PROAMATINE) 10 MG tablet 1 TABLET BY MOUTH AT 7 AM, NOON, AND BEDTIME 270 tablet 3  . warfarin (COUMADIN) 1 MG tablet Take 2-3 mg by mouth daily. Take 2 mg daily except 3 mg on Monday and Friday    . warfarin (COUMADIN) 1 MG tablet Take 1 mg by mouth daily. 2 mg every day, and 3 mg Monday, Wednesday, Friday    . DULoxetine (CYMBALTA) 30 MG capsule Take 1 capsule (30 mg total) by mouth daily.  30 capsule 5   No current facility-administered medications for this visit.    Allergies:   Vicodin; Adhesive; Oxycodone; and Silicone    Social History:  The patient  reports that she has never smoked. She has never used smokeless tobacco. She reports that she does not drink alcohol or use illicit drugs.   Family  History:  The patient's family history includes Hypertension in her mother.    ROS:  Please see the history of present illness.  Otherwise, review of systems are positive for none.   All other systems are reviewed and negative.    PHYSICAL EXAM: VS:  BP 120/74 mmHg  Pulse 93  Ht 5\' 1"  (1.549 m)  Wt 46.993 kg (103 lb 9.6 oz)  BMI 19.59 kg/m2 , BMI Body mass index is 19.59 kg/(m^2). GENERAL:  Chronically ill-appearing.  No acute distress.  Voice is stronger. HEENT:  Pupils equal round and reactive, fundi not visualized, oral mucosa unremarkable.  Hoarse voice NECK:  No jugular venous distention, waveform within normal limits, carotid upstroke brisk and symmetric, no bruits, no thyromegaly LYMPHATICS:  No cervical adenopathy LUNGS:  Clear to auscultation bilaterally. HEART:  RRR.  PMI not displaced or sustained,S1 and S2 within normal limits, no S3, no S4, no clicks, no rubs, no murmurs ABD:  Flat, positive bowel sounds normal in frequency in pitch, no bruits, no rebound, no guarding, no midline pulsatile mass, no hepatomegaly, no splenomegaly EXT:  2 plus pulses throughout, no edema, no cyanosis no clubbing SKIN:  No rashes no nodules NEURO:  Cranial nerves II through XII grossly intact, motor grossly intact throughout PSYCH:  Cognitively intact, oriented to person place and time  EKG:  EKG is not ordered today.   Echo 06/04/15: Study Conclusions  - Left ventricle: The cavity size was normal. Wall thickness was increased in a pattern of mild LVH. Systolic function was mildly reduced. The estimated ejection fraction was in the range of 45% to 50%. Abnormal GLPSS  at -11%, with inferior and inferolateral strain abnormality. Basal to mid inferolateral wall hypokinesis. Doppler parameters are consistent with restrictive left ventricular relaxation (grade 3 diastolic dysfunction). The E/A ratio is >2.5. The E/e&' ratio is >20, suggesting markedly elevated LV filling pressure. - Mitral valve: Mildly thickened leaflets with tethering of the posterior leaflet. There is severe, posteriorly directed regurgitation which hugs the atrial free wall. Reversal of flow is seen in the pulmonary vein. - Left atrium: Severely dilated at 52 ml/m2. - Right atrium: The atrium was mildly dilated. - Tricuspid valve: There was mild regurgitation. - Pulmonary arteries: PA peak pressure: 41 mm Hg (S). - Inferior vena cava: The vessel was normal in size. The respirophasic diameter changes were in the normal range (>= 50%), consistent with normal central venous pressure. - Pericardium, extracardiac: A trivial pericardial effusion was identified posterior to the heart. Features were not consistent with tamponade physiology.  Impressions:  - LVEF 45-50%, mild LVH, abnormal GLPSS at -11% with inferolateral strain and wall motion abnormalities. There is tethering of the posterior mitral leaflet and severe posteriorly directed mitral regurgitation. Restrictive diastolic dysfunction with likely elevated LV filling pressures. Severely dilated LA at 52 ml/m2, mild TR, RVSP elevated at 41 mmHg (consistent with mild pulmonary venous hypertension), trivial posterior pericardial effusion without tamponade physiology.  PFT 11/05/14:  FEV1  1.9 L FVC : 2.4 L FEV1 and FVC are reduced, but the FEV1/FVC ratio is normal.  Following adminitration of bronchodilator there was no significant response.  Consistent with minimal obstructive airway disease.    LHC 07/08/15:  Severe mitral regurgitation with large V waves and 4+ MR  There is mild to  moderate left ventricular systolic dysfunction. With cardiac output/index moderate-severely reduced.  Angiographically normal coronary arteries  Cardiac MRI 08/14/15: IMPRESSION: 1) Mildly asymmetric septal hypertrophy 15 mm. Distal septal apical hypokinesis EF 45%  2) Diffuse delayed gadolinium uptake in the subendocardial and mid epicardial areas especially marked in the inferior wall  3) S/P mitral valve repair with no residual MR and annuloplasty ring  4) Atri-Clip of the LAA with no flow  5) No residual PDA flow  6) Mild LAE  7) Small pericardial effusion  8) Large left pleural effusion  Note delayed gadolinium findings may be consistent with myocarditis vs infiltrative cardiomyopathy  Echo 09/04/15: Study Conclusions  - Left ventricle: The cavity size was normal. There was mild concentric hypertrophy. Systolic function was moderately reduced. The estimated ejection fraction was in the range of 35% to 40%. Diffuse hypokinesis. - Aortic valve: Trileaflet; mildly thickened, mildly calcified leaflets. There was trivial regurgitation. - Mitral valve: S/P mitral valve repair with annuloplasty ring. The findings are consistent with mild stenosis. Valve area by continuity equation (using LVOT flow): 1.34 cm^2. - Tricuspid valve: There was mild regurgitation. - Pulmonary arteries: PA peak pressure: 39 mm Hg (S). - Pericardium, extracardiac: There was a left pleural effusion.  Impressions:  - The right ventricular systolic pressure was increased consistent with mild pulmonary hypertension.   Recent Labs: 08/05/2015: TSH 2.455 09/03/2015: Magnesium 1.9 10/13/2015: B Natriuretic Peptide 438.6* 10/21/2015: ALT 19 11/04/2015: BUN 13; Creatinine, Ser 0.53; Hemoglobin 12.4; Platelets 228; Potassium 4.4; Sodium 138    Lipid Panel    Component Value Date/Time   CHOL 181 09/04/2015 0000      Wt Readings from Last 3 Encounters:  11/14/15 46.993 kg (103  lb 9.6 oz)  11/12/15 46.902 kg (103 lb 6.4 oz)  11/06/15 47.537 kg (104 lb 12.8 oz)      ASSESSMENT AND PLAN:  # Chronic systolic and diastolic heart failure:  # Orthostatic hypotension: Ms. Broeker is euvolemic on exam.  She continues to require drainage of her pleurex catheter three times per week.  She rarely requires lasix.  She continues to be dizzy in the early morning.  She will start taking her last dose of midodrine prior to going to bed instead of 7pm. Continue ivabradine.  # Mood changes: I suspect that Ms. Strout's depressive symptoms are due to the Lyrica.  It wa started for the pain associated with her chest tube.  We will stop Lyrica and start Cymbalta 30 mg daily.  # Mitral regurgitation s/p mitral valve repair:  Ms. Dunaj's valve is now stable and has no significant regurgitation post repair.  # Paroxysmal atrial fibrillation: Ms. Lordi remains in sinus rhythm.  LA appendage was clipped in surgery.  There was no flow in the LAA on cardiac MRI.  Therefore, it is reasonable to hold warfarin for any necessary procedures without the need for bridging anticoagulation.  This patients CHA2DS2-VASc Score and unadjusted Ischemic Stroke Rate (% per year) is equal to 2.2 % stroke rate/year from a score of 2  Above score calculated as 1 point each if present [CHF, HTN, DM, Vascular=MI/PAD/Aortic Plaque, Age if 65-74, or Female] Above score calculated as 2 points each if present [Age > 75, or Stroke/TIA/TE]   # ICD: Managed by Dr. Elberta Fortis   Current medicines are reviewed at length with the patient today.  The patient does not have concerns regarding medicines.  The following changes have been made:  no change  Labs/ tests ordered today include:   No orders of the defined types were placed in this encounter.    Disposition:   FU with Taquilla Downum C. Duke Salvia, MD in 1 month   Signed, Chilton Si, MD  11/15/2015 6:56 AM    [facility]

## 2015-11-14 NOTE — Patient Instructions (Addendum)
Medication Instructions:  STOP LYRICA   START CYMBALTA 30 MG DAILY   TRY TAKING THE MIDODRINE AT 7 AM, NOON, AND BEDTIME.   Labwork: NONE  Testing/Procedures: NONE  Follow-Up: Your physician recommends that you schedule a follow-up appointment in: 1 MONTH OV  If you need a refill on your cardiac medications before your next appointment, please call your pharmacy.

## 2015-11-15 ENCOUNTER — Encounter: Payer: Self-pay | Admitting: Cardiovascular Disease

## 2015-11-17 ENCOUNTER — Telehealth (HOSPITAL_COMMUNITY): Payer: Self-pay | Admitting: Pharmacist

## 2015-11-17 ENCOUNTER — Telehealth (HOSPITAL_COMMUNITY): Payer: Self-pay | Admitting: *Deleted

## 2015-11-17 ENCOUNTER — Telehealth: Payer: Self-pay | Admitting: Cardiovascular Disease

## 2015-11-17 NOTE — Telephone Encounter (Signed)
[name] called stating that she was not able to tolerate Lyrica 75 mg once daily since it was causing hypotension and lightheadedness. Her PCP switched her to Cymbalta which she stated made her feel worse than the Lyrica. She has now stopped both of them but asked if it would be ok for her to go without her evening doses of Corlanor and midodrine since she is very nauseous and has not been able to keep much down since taking Cymbalta this am. She stated that her HR is 83 bpm and SBP is 112 mmHg. Told her that she could hold her evening doses of these medications and call us if her symptoms are not improved or worse tomorrow.   Tyler Deis. Bonnye Fava, PharmD, BCPS, CPP Clinical Pharmacist Pager: [phone] Phone: [phone] 11/17/2015 4:03 PM

## 2015-11-17 NOTE — Telephone Encounter (Signed)
Pt left voice message saying she was having some side effects with Lyrica and would like to talk to the pharmacist.

## 2015-11-17 NOTE — Telephone Encounter (Signed)
Corlanor 5 mg BID and under PA approved by [employer] through 07/18/38.   Tyler Deis. Bonnye Fava, PharmD, BCPS, CPP Clinical Pharmacist Pager: [phone] Phone: [phone] 11/17/2015 9:08 AM

## 2015-11-17 NOTE — Telephone Encounter (Signed)
Spoke with pt who said she had already talked to the pharmacist at the heart and vascular office. Encounter in [facility]. Encouraged pt to follow the advice given to her from that doctor's office. Advised pt to call 911 or go to the ER if symptoms(nausea, fatigue) worsens or new ones develop. Pt verbalized understanding.  Will route to Dr Duke Salvia and Regis Bill.

## 2015-11-17 NOTE — Telephone Encounter (Signed)
I would stop Cymbalta.  She can restart Lyrica for her pain.  Follow up with her PCP for other options on treating neuropathic pain or referral to pain clinic.

## 2015-11-17 NOTE — Telephone Encounter (Signed)
Left message to call back  

## 2015-11-17 NOTE — Telephone Encounter (Signed)
Pt took Cymbalta 1 pill this am - feels dizzy-restless-nausea-vomiting-fatigue-"nightmarish feeling"-SOB--pls  advise

## 2015-11-19 ENCOUNTER — Other Ambulatory Visit: Payer: Self-pay | Admitting: Surgical

## 2015-11-20 ENCOUNTER — Telehealth (HOSPITAL_COMMUNITY): Payer: Self-pay | Admitting: *Deleted

## 2015-11-20 NOTE — Telephone Encounter (Signed)
Received VM from [place], she states pt is still very orthostatic, layin 105/51 and dropped to 55/40, 62/44 pt was very dizzy.  Pt is not on any BP lowering agent or lasix, she is taking Midodrine.  Will send to MD for review

## 2015-11-24 ENCOUNTER — Other Ambulatory Visit: Payer: Self-pay | Admitting: Cardiovascular Disease

## 2015-11-24 MED ORDER — WARFARIN SODIUM 1 MG PO TABS: ORAL_TABLET | ORAL | Status: DC

## 2015-11-24 NOTE — Telephone Encounter (Signed)
Let's add northera 100mg  tid.  Schedule follow up with me or TOC next week.

## 2015-11-25 ENCOUNTER — Ambulatory Visit (INDEPENDENT_AMBULATORY_CARE_PROVIDER_SITE_OTHER): Payer: BLUE CROSS/BLUE SHIELD | Admitting: *Deleted

## 2015-11-25 DIAGNOSIS — Z7901 Long term (current) use of anticoagulants: Secondary | ICD-10-CM | POA: Diagnosis not present

## 2015-11-25 DIAGNOSIS — Z9889 Other specified postprocedural states: Secondary | ICD-10-CM | POA: Diagnosis not present

## 2015-11-25 LAB — POCT INR: INR: 1.1

## 2015-11-25 MED ORDER — DROXIDOPA 100 MG PO CAPS: 100.0000 mg | ORAL_CAPSULE | Freq: Three times a day (TID) | ORAL | Status: DC

## 2015-11-25 NOTE — Telephone Encounter (Signed)
Advised patient, verbalized understanding  

## 2015-11-26 NOTE — Telephone Encounter (Signed)
Patient will keep follow up with Dr Clarise Cruz next week

## 2015-11-28 NOTE — Telephone Encounter (Signed)
Patient did stop Cymbalta and went back to the Lyric every other day

## 2015-12-02 ENCOUNTER — Ambulatory Visit (INDEPENDENT_AMBULATORY_CARE_PROVIDER_SITE_OTHER): Payer: BLUE CROSS/BLUE SHIELD | Admitting: *Deleted

## 2015-12-02 DIAGNOSIS — Z9889 Other specified postprocedural states: Secondary | ICD-10-CM

## 2015-12-02 DIAGNOSIS — Z7901 Long term (current) use of anticoagulants: Secondary | ICD-10-CM

## 2015-12-02 LAB — POCT INR: INR: 1.2

## 2015-12-05 ENCOUNTER — Other Ambulatory Visit (HOSPITAL_COMMUNITY): Payer: Self-pay | Admitting: Pharmacist

## 2015-12-05 ENCOUNTER — Ambulatory Visit (HOSPITAL_COMMUNITY)
Admission: RE | Admit: 2015-12-05 | Discharge: 2015-12-05 | Disposition: A | Discharge status: Home / Self Care | Payer: BLUE CROSS/BLUE SHIELD | Source: Ambulatory Visit | Attending: Internal Medicine | Admitting: Internal Medicine

## 2015-12-05 ENCOUNTER — Encounter (HOSPITAL_COMMUNITY): Payer: Self-pay | Admitting: Internal Medicine

## 2015-12-05 VITALS — BP 110/74 | HR 86 | Wt 105.5 lb

## 2015-12-05 DIAGNOSIS — Z8249 Family history of ischemic heart disease and other diseases of the circulatory system: Secondary | ICD-10-CM | POA: Insufficient documentation

## 2015-12-05 DIAGNOSIS — Z95810 Presence of automatic (implantable) cardiac defibrillator: Secondary | ICD-10-CM | POA: Diagnosis not present

## 2015-12-05 DIAGNOSIS — Z7901 Long term (current) use of anticoagulants: Secondary | ICD-10-CM | POA: Insufficient documentation

## 2015-12-05 DIAGNOSIS — I5042 Chronic combined systolic (congestive) and diastolic (congestive) heart failure: Secondary | ICD-10-CM | POA: Diagnosis not present

## 2015-12-05 DIAGNOSIS — Z885 Allergy status to narcotic agent status: Secondary | ICD-10-CM | POA: Diagnosis not present

## 2015-12-05 DIAGNOSIS — Z888 Allergy status to other drugs, medicaments and biological substances status: Secondary | ICD-10-CM | POA: Insufficient documentation

## 2015-12-05 DIAGNOSIS — I5043 Acute on chronic combined systolic (congestive) and diastolic (congestive) heart failure: Secondary | ICD-10-CM | POA: Diagnosis present

## 2015-12-05 DIAGNOSIS — R0781 Pleurodynia: Secondary | ICD-10-CM | POA: Diagnosis not present

## 2015-12-05 DIAGNOSIS — I480 Paroxysmal atrial fibrillation: Secondary | ICD-10-CM | POA: Diagnosis not present

## 2015-12-05 DIAGNOSIS — Z9889 Other specified postprocedural states: Secondary | ICD-10-CM | POA: Diagnosis not present

## 2015-12-05 DIAGNOSIS — Z79899 Other long term (current) drug therapy: Secondary | ICD-10-CM | POA: Diagnosis not present

## 2015-12-05 DIAGNOSIS — Z8674 Personal history of sudden cardiac arrest: Secondary | ICD-10-CM | POA: Diagnosis not present

## 2015-12-05 MED ORDER — IVABRADINE HCL 5 MG PO TABS: 5.0000 mg | ORAL_TABLET | Freq: Two times a day (BID) | ORAL | Status: DC

## 2015-12-05 NOTE — Patient Instructions (Signed)
Increase Corlanor to 5 mg (1 tab) Twice daily   Take Furosemide (Lasix) as needed  Your physician recommends that you schedule a follow-up appointment in: 6 weeks

## 2015-12-05 NOTE — Progress Notes (Signed)
[name], female   DOB: 17-Jun-1961, 55 y.o.   MRN: [id number]   [facility] NOTE   Date:  12/05/2015   ID:  Diane [name], DOB May 23, 1961, MRN [id number]  PCP:  Darrow Bussing, MD  Cardiologist:   Dr. Duke Salvia Electrophysiologist: Dr. Berton Mount   [name] is a 55 y.o. female who is referred to the HF Clinic by Dr. Duke Salvia for further evaluation of her chronic systolic and diastolic heart failure and possible infiltrative CM.  Says her symptoms started about 2 years ago with syncope and DOE. Finally referred to Dr. Duke Salvia in 12/16. Echocardiography revealed severe mitral regurgitation and a reduced LVEF (45-50%).  The MR was due to tethering of the posterior leaflet.  She was also noted to have grade 3 diastolic dysfunction with mid basal to mid inferolateral wall hypokinesis.  On 07/22/15 underwent repair of her mitral valve with a 26 mm Sorin Memo 3D annuloplasty ring on 07/22/15 by Dr. Cornelius Moras.  Intra-operatively she was noted to have a PDA with left to right shunting that was ligated in the OR. The left atrium was also clipped.   Her post operative course was initially unremarkable and she was discharged on post-op day 5.    On 08/05/15 she suffered a cardiac arrest at home and was resuscitated by EMS.  The initial rhythm was unclear but she had VT upon arrival in the ED, which was followed by atrial fibrillation.  She was treated with hypothermia and had prolonged hospital course. Fortunately she has no residual cognitive deficits.  During that hospitalization she underwent cardiac MRI with LVEF 45% with hypokinesis of the distal septum and apex.  There was diffuse, delayed enhancement in the subendocardium and mid epicardial region, most prominent in the inferior wall.  This was concerning for myocarditis or infiltrative cardiomyopathy.  Her EKG showed QT prolongation and she had an ICD placed for secondary prevention.  Her hospitalization was delayed by  severe orthostatic hypotension and recurrent syncope requiring high doses of florinef and midodrine which were weaned down.  She was discharged home but was readmitted with volume overload on 2/16.  Echo showed LVEF 35% with diffuse hypokinesis and her mitral valve was functioning well.  She also underwent L thoracentesis with removal of 1.4L and received IV lasix.  She was discharged home on 2/16 and followed up in clinic on 2/23.  At that time she reported some dizziness and was hypotensive to 84/58.   She underwent Pleurex catheter placement for recurrent left pleural effusion on 10/20/16.   She returns for f/u. Feels a bit better.  Draining pleurex 3x/week. About 300-400cc at a time. Lyrica helping with pain. BP slightly improved but still had 2 syncopal episodes with low BP. Remains on midodrine at 10 tid. Dr. Duke Salvia recently added Lorin Picket but she hasn't got it yet. Able to eat a little more weight up 2 pounds. HR improved on Corlanor. Has not taken lasix recently.   ICD: No AF/VT. Fluid trending up but not over threshold. Activity level 2hr/day   Past Medical History  Diagnosis Date  . Severe mitral regurgitation 07/03/2015  . Chronic combined systolic and diastolic CHF (congestive heart failure) (HCC) 07/03/2015    Grade 3 diastolic dysfunction.  LVEF 40-45% 05/2015.  Marland Kitchen Aortic aneurysm (HCC)   . Patent ductus arteriosus 07/16/2015  . Depression     SOME DEPRESSION (ON MEDS FOR 2 YRS) WHEN HER MOTHER PASSED  . S/P mitral valve repair and ligation  of patent ductus arteriosus 07/22/2015    26 mm Sorin Memo 3D ring annuloplasty with ligation of patent ductus arteriosus and clipping of LA appendage  . Cardiac arrest (HCC)   . AICD (automatic cardioverter/defibrillator) present   . Shortness of breath dyspnea   . Anxiety   . Arthritis   . Anemia     Past Surgical History  Procedure Laterality Date  . Ovarian cyst removal    . Tee without cardioversion N/A 07/07/2015    Procedure:  TRANSESOPHAGEAL ECHOCARDIOGRAM (TEE);  Surgeon: Chilton Si, MD;  Location: [facility] ENDOSCOPY;  Service: Cardiovascular;  Laterality: N/A;  . Cardiac catheterization N/A 07/08/2015    Procedure: Right/Left Heart Cath and Coronary Angiography;  Surgeon: Marykay Lex, MD;  Location: [facility] INVASIVE CV LAB;  Service: Cardiovascular;  Laterality: N/A;  . Mitral valve repair N/A 07/22/2015    Procedure: MITRAL VALVE REPAIR;  Surgeon: Purcell Nails, MD;  Location: [facility] OR;  Service: Open Heart Surgery;  Laterality: N/A;  26 Sorin Memo 3D  . Tee without cardioversion N/A 07/22/2015    Procedure: TRANSESOPHAGEAL ECHOCARDIOGRAM (TEE);  Surgeon: Purcell Nails, MD;  Location: [facility] OR;  Service: Open Heart Surgery;  Laterality: N/A;  . Patent ductus arterious repair N/A 07/22/2015    Procedure: PATENT DUCTUS ARTERIOSUS (PDA) ligation;  Surgeon: Purcell Nails, MD;  Location: [facility] OR;  Service: Open Heart Surgery;  Laterality: N/A;  . Clipping of atrial appendage  07/22/2015    Procedure: CLIPPING OF LEFT ATRIAL APPENDAGE;  Surgeon: Purcell Nails, MD;  Location: [facility] OR;  Service: Open Heart Surgery;;  35 Atriclip Pro  . Ep implantable device N/A 08/15/2015    Procedure: ICD Implant;  Surgeon: Will Jorja Loa, MD;  Location: [facility] INVASIVE CV LAB;  Service: Cardiovascular;  Laterality: N/A;  . Colonoscopy    . Chest tube insertion Left 10/21/2015    Procedure: INSERTION left PLEURAL DRAINAGE CATHETER;  Surgeon: Purcell Nails, MD;  Location: [facility] OR;  Service: Thoracic;  Laterality: Left;  Marland Kitchen Microlaryngoscopy with co2 laser and excision of vocal cord lesion N/A 11/04/2015    Procedure: MICROLARYNGOSCOPY, RADIESSE VOCAL CORD AUGMENTATION;  Surgeon: Flo Shanks, MD;  Location: [facility] OR;  Service: ENT;  Laterality: N/A;     Current Outpatient Prescriptions  Medication Sig Dispense Refill  . ivabradine (CORLANOR) 5 MG TABS tablet Take 0.5 tablets (2.5 mg total) by mouth 2 (two) times daily with a meal. 30 tablet 6  .  midodrine (PROAMATINE) 10 MG tablet 1 TABLET BY MOUTH AT 7 AM, NOON, AND BEDTIME 270 tablet 3  . pregabalin (LYRICA) 25 MG capsule Take 25 mg by mouth every other day.    . warfarin (COUMADIN) 1 MG tablet Take 1 mg by mouth daily. 2 mg every day, and 3 mg Monday, Wednesday, Friday    . Droxidopa (NORTHERA) 100 MG CAPS Take 100 mg by mouth 3 (three) times daily. (Patient not taking: Reported on 12/05/2015) 90 capsule 3   No current facility-administered medications for this encounter.    Allergies:   Vicodin; Adhesive; Cymbalta; Oxycodone; and Silicone    Social History:  The patient  reports that she has never smoked. She has never used smokeless tobacco. She reports that she does not drink alcohol or use illicit drugs.   Family History:  The patient's family history includes Hypertension in her mother.    ROS:  Please see the history of present illness.  Otherwise, review of systems are positive for none.  All other systems are reviewed and negative.    PHYSICAL EXAM:  Filed Vitals:   12/05/15 1032  BP: 110/74  Weight: 105 lb 8 oz (47.854 kg)  SpO2: 98%    GENERAL:  NAD   HEENT:  Pupils equal round and reactive, fundi not visualized, oral mucosa unremarkable.  Hoarse voice NECK:  JVP 8 LYMPHATICS:  No cervical adenopathy LUNGS:  Clear.  Pleurex cat site ok  HEART:  RRR.  PMI not displaced or sustained,S1 and S2 within normal limits, no S3, no S4, no clicks, no rubs, no murmurs ABD:  Flat, positive bowel sounds normal in frequency in pitch, no bruits, no rebound, no guarding, no midline pulsatile mass, no hepatomegaly, no splenomegaly EXT:  2 plus pulses throughout, trace edema, no cyanosis no clubbing SKIN:  No rashes no nodules NEURO:  Cranial nerves II through XII grossly intact, motor grossly intact throughout [facility]:  Cognitively intact, oriented to person place and time   PFT 11/05/14:  FEV1  1.9 L FVC : 2.4 L FEV1 and FVC are reduced, but the FEV1/FVC ratio is normal.   Following adminitration of bronchodilator there was no significant response.  Consistent with minimal obstructive airway disease.    LHC 07/08/15:  Severe mitral regurgitation with large V waves and 4+ MR  There is mild to moderate left ventricular systolic dysfunction. With cardiac output/index moderate-severely reduced.  Angiographically normal coronary arteries   Recent Labs: 08/05/2015: TSH 2.455 09/03/2015: Magnesium 1.9 10/13/2015: B Natriuretic Peptide 438.6* 10/21/2015: ALT 19 11/04/2015: BUN 13; Creatinine, Ser 0.53; Hemoglobin 12.4; Platelets 228; Potassium 4.4; Sodium 138    Lipid Panel    Component Value Date/Time   CHOL 181 09/04/2015 0000      Wt Readings from Last 3 Encounters:  12/05/15 105 lb 8 oz (47.854 kg)  11/14/15 103 lb 9.6 oz (46.993 kg)  11/12/15 103 lb 6.4 oz (46.902 kg)      ASSESSMENT AND PLAN:  1) Acute on  Chronic systolic and diastolic heart failure: --EF 35% on echo; 40% on cMRI. S/p ICD for SCD. NYHA III-IIIb --Volume status slightly up off lasix. Will take 1 dose per week or so as needed --MRI reviewed and is suggestive of possible infiltrative process or myocarditis. I actually favor myocarditis. SPEP/UPEP negative as well as serologies for CTD. I will review with Drs. Delton See and [place]. Unfortunately cannot repeat MRI due to ICD. Eventually may need to consider EM biopsy but I do not think this is emergent. And she doe not want to pursue at this time.  --She continues to require midodrine for BP support, so no beta blocker or ACE-I/ARB at this time. Northera recently added  --Increase corlanor 5 bid  2) Recurrent left pleural effusion -- Now s/p pleurex catheter. Continues to drain. Followed by Dr. Cornelius Moras and [facility]. May need to consider VATS at some point  3) Mitral regurgitation s/p mitral valve repair:  Stable s/p MV repair  4) Paroxysmal atrial fibrillation: Remains in sinus rhythm.  LA appendage was clipped in surgery.  The patients  CHA2DS2-VASc Score and unadjusted Ischemic Stroke Rate (% per year) is equal to 2.2 % stroke rate/year from a score of 2 . Continue warfarin.   5) Pleuritc pain  --Continue Lyrica . Can increase as needed.     Migdalia Dk, MD  12/05/2015 11:23 AM

## 2015-12-05 NOTE — Addendum Note (Signed)
Encounter addended by: Noralee Space, RN on: 12/05/2015 11:44 AM<BR>     Documentation filed: Patient Instructions Section, Orders

## 2015-12-09 ENCOUNTER — Ambulatory Visit (INDEPENDENT_AMBULATORY_CARE_PROVIDER_SITE_OTHER): Payer: BLUE CROSS/BLUE SHIELD | Admitting: Pharmacist

## 2015-12-09 DIAGNOSIS — Z7901 Long term (current) use of anticoagulants: Secondary | ICD-10-CM

## 2015-12-09 DIAGNOSIS — Z9889 Other specified postprocedural states: Secondary | ICD-10-CM

## 2015-12-09 LAB — POCT INR: INR: 1.5

## 2015-12-11 ENCOUNTER — Encounter: Payer: Self-pay | Admitting: Internal Medicine

## 2015-12-15 NOTE — Progress Notes (Addendum)
Cardiology Office Note   Date:  12/16/2015   ID:  Diane [name], DOB Jul 14, 1961, MRN [id number]  PCP:  Darrow Bussing, MD  Cardiologist:   Chilton Si, MD  Electrophysiologist: Dr. Berton Mount  Chief Complaint  Patient presents with  . Shortness of Breath    pt is very SOB      [name] is a 55 y.o. female who presents for follow up on mitral valve repair and chronic systolic and diastolic heart failure.   History of Present Illness Diane [name] initially presented 05/2015 with recurrent syncope and progressive shortness of breath.  She previously had an exercise Myoview 07/2013 that was negative for ischemia.  She underwent echocardiography 05/2015 that revealed severe mitral regurgitation and a reduced LVEF (45-50%). The MR was due to tethering of the posterior leaflet. She was also noted to have grade 3 diastolic dysfunction with mid basal to mid inferolateral wall hypokinesis.  Diane [name] underwent repair of her mitral valve with a 26 mm Sorin Memo 3D annuloplasty ring on 07/22/15. Intra-operatively she was noted to have a PDA with left to right shunting that was ligated in the OR. The left atrium was also clipped. Her post operative course was initially unremarkable and she was discharged on post-op day 5. On 1/17 she suffered a cardiac arrest at home and was resuscitated by EMS. The initial rhythm was unclear but she had VT upon arrival in the ED, which was followed by atrial fibrillation. She was treated with hypothermia and has no residual cognitive deficits. During that hospitalization she underwent cardiac MRI with LVEF 45% with hypokinesis of the distal septum and apex. There was diffuse, delayed enhancement in the subendocardium and mid epicardial region, most prominent in the inferior wall. This was concerning for myocarditis or infiltrative cardiomyopathy. Her EKG showed QT prolongation and she had an ICD placed for secondary prevention. Her  hospitalization was delayed by severe orthostatic hypotension and recurrent syncope requiring high doses of florinef and midodrine. She was discharged home but was readmitted with volume overload on 2/16. Echo showed LVEF 35-40% with diffuse hypokinesis and her mitral valve was functioning well.   Since her initial hospitalization she has struggled with hypotension and recurrent L pleural effusion.  She had a pleurex catheter placed in the L chest.  She drains 300-450 mL three times per week.  She was started on ivabradine 2.5 mg bid on 4/20 and the dose was increased to 5 mg on 12/05/15. She was having severe pain from the pleurex catheter and was started on Lyrica.  She feels that Lyrica has made her feel more depressed but helps her pain.  We attempted to switch her to Cymbalta, but this made her feel worse.  She also continues to have dizziness and orthostatic hypotension so she was started on Northera.  Florinef was previously discontinued due to edema.   Diane [name] had been doing well until 2 days ago.  She started feeling very tired and nauseous.  Her PO intake has reduced significantly and her pre-syncope is worse.  She has a hard time walking or even getting out of bed.  She has been sleeping a lot.  Last week she had 2 episodes of syncope.  She also endorses headache but denies fever, chills, abdominal pain, or diarrhea.  She stopped taking her Lyrica because she was afraid it may be causing her symptoms.  However, this has not helped.  She has not yet received Northera.  Past Medical History  Diagnosis Date  . Severe mitral regurgitation 07/03/2015  . Chronic combined systolic and diastolic CHF (congestive heart failure) (HCC) 07/03/2015    Grade 3 diastolic dysfunction.  LVEF 40-45% 05/2015.  Marland Kitchen Aortic aneurysm (HCC)   . Patent ductus arteriosus 07/16/2015  . Depression     SOME DEPRESSION (ON MEDS FOR 2 YRS) WHEN HER MOTHER PASSED  . S/P mitral valve repair and ligation of patent  ductus arteriosus 07/22/2015    26 mm Sorin Memo 3D ring annuloplasty with ligation of patent ductus arteriosus and clipping of LA appendage  . Cardiac arrest (HCC)   . AICD (automatic cardioverter/defibrillator) present   . Shortness of breath dyspnea   . Anxiety   . Arthritis   . Anemia     Past Surgical History  Procedure Laterality Date  . Ovarian cyst removal    . Tee without cardioversion N/A 07/07/2015    Procedure: TRANSESOPHAGEAL ECHOCARDIOGRAM (TEE);  Surgeon: Chilton Si, MD;  Location: [facility] ENDOSCOPY;  Service: Cardiovascular;  Laterality: N/A;  . Cardiac catheterization N/A 07/08/2015    Procedure: Right/Left Heart Cath and Coronary Angiography;  Surgeon: Marykay Lex, MD;  Location: [facility] INVASIVE CV LAB;  Service: Cardiovascular;  Laterality: N/A;  . Mitral valve repair N/A 07/22/2015    Procedure: MITRAL VALVE REPAIR;  Surgeon: Purcell Nails, MD;  Location: [facility] OR;  Service: Open Heart Surgery;  Laterality: N/A;  26 Sorin Memo 3D  . Tee without cardioversion N/A 07/22/2015    Procedure: TRANSESOPHAGEAL ECHOCARDIOGRAM (TEE);  Surgeon: Purcell Nails, MD;  Location: [facility] OR;  Service: Open Heart Surgery;  Laterality: N/A;  . Patent ductus arterious repair N/A 07/22/2015    Procedure: PATENT DUCTUS ARTERIOSUS (PDA) ligation;  Surgeon: Purcell Nails, MD;  Location: [facility] OR;  Service: Open Heart Surgery;  Laterality: N/A;  . Clipping of atrial appendage  07/22/2015    Procedure: CLIPPING OF LEFT ATRIAL APPENDAGE;  Surgeon: Purcell Nails, MD;  Location: [facility] OR;  Service: Open Heart Surgery;;  35 Atriclip Pro  . Ep implantable device N/A 08/15/2015    Procedure: ICD Implant;  Surgeon: Will Jorja Loa, MD;  Location: [facility] INVASIVE CV LAB;  Service: Cardiovascular;  Laterality: N/A;  . Colonoscopy    . Chest tube insertion Left 10/21/2015    Procedure: INSERTION left PLEURAL DRAINAGE CATHETER;  Surgeon: Purcell Nails, MD;  Location: [facility] OR;  Service: Thoracic;  Laterality: Left;  Marland Kitchen  Microlaryngoscopy with co2 laser and excision of vocal cord lesion N/A 11/04/2015    Procedure: MICROLARYNGOSCOPY, RADIESSE VOCAL CORD AUGMENTATION;  Surgeon: Flo Shanks, MD;  Location: [facility] OR;  Service: ENT;  Laterality: N/A;     Current Outpatient Prescriptions  Medication Sig Dispense Refill  . Droxidopa (NORTHERA) 100 MG CAPS Take 100 mg by mouth 3 (three) times daily. 90 capsule 3  . ivabradine (CORLANOR) 5 MG TABS tablet Take 1 tablet (5 mg total) by mouth 2 (two) times daily with a meal. 60 tablet 6  . midodrine (PROAMATINE) 10 MG tablet 1 TABLET BY MOUTH AT 7 AM, NOON, AND BEDTIME 270 tablet 3  . pregabalin (LYRICA) 25 MG capsule Take 25 mg by mouth every other day.    . warfarin (COUMADIN) 1 MG tablet Take 1 mg by mouth daily. 2 mg every day, and 3 mg Monday, Wednesday, Friday     No current facility-administered medications for this visit.    Allergies:   Vicodin; Adhesive; Cymbalta; Oxycodone; and Silicone  Social History:  The patient  reports that she has never smoked. She has never used smokeless tobacco. She reports that she does not drink alcohol or use illicit drugs.   Family History:  The patient's family history includes Hypertension in her mother.    ROS:  Please see the history of present illness.  Otherwise, review of systems are positive for none.   All other systems are reviewed and negative.    PHYSICAL EXAM: VS:  BP 110/78 mmHg  Pulse 78  Ht  (1.549 m)  Wt 45.36 kg (100 lb)  BMI 18.90 kg/m2 , BMI Body mass index is 18.9 kg/(m^2). GENERAL:  Chronically ill-appearing.  Appears mildly ill and weak.  Voice is stronger. HEENT:  Pupils equal round and reactive, fundi not visualized, oral mucosa unremarkable.  Hoarse voice NECK:  No jugular venous distention, waveform within normal limits, carotid upstroke brisk and symmetric, no bruits, no thyromegaly LYMPHATICS:  No cervical adenopathy LUNGS:  Clear to auscultation bilaterally. HEART:  RRR.  PMI not  displaced or sustained,S1 and S2 within normal limits, no S3, no S4, no clicks, no rubs, no murmurs ABD:  Flat, positive bowel sounds normal in frequency in pitch, no bruits, no rebound, no guarding, no midline pulsatile mass, no hepatomegaly, no splenomegaly EXT:  2 plus pulses throughout, no edema, no cyanosis no clubbing SKIN:  No rashes no nodules NEURO:  Cranial nerves II through XII grossly intact, motor grossly intact throughout PSYCH:  Cognitively intact, oriented to person place and time  EKG:  EKG is ordered today.  sinus rhythm. Rate 78 bpm. Left bundle-branch block.  Left ventricular hypertrophy. Left anterior fascicular block.   Echo 06/04/15: Study Conclusions  - Left ventricle: The cavity size was normal. Wall thickness was increased in a pattern of mild LVH. Systolic function was mildly reduced. The estimated ejection fraction was in the range of 45% to 50%. Abnormal GLPSS at -11%, with inferior and inferolateral strain abnormality. Basal to mid inferolateral wall hypokinesis. Doppler parameters are consistent with restrictive left ventricular relaxation (grade 3 diastolic dysfunction). The E/A ratio is >2.5. The E/e&' ratio is >20, suggesting markedly elevated LV filling pressure. - Mitral valve: Mildly thickened leaflets with tethering of the posterior leaflet. There is severe, posteriorly directed regurgitation which hugs the atrial free wall. Reversal of flow is seen in the pulmonary vein. - Left atrium: Severely dilated at 52 ml/m2. - Right atrium: The atrium was mildly dilated. - Tricuspid valve: There was mild regurgitation. - Pulmonary arteries: PA peak pressure: 41 mm Hg (S). - Inferior vena cava: The vessel was normal in size. The respirophasic diameter changes were in the normal range (>= 50%), consistent with normal central venous pressure. - Pericardium, extracardiac: A trivial pericardial effusion was identified posterior to  the heart. Features were not consistent with tamponade physiology.  Impressions:  - LVEF 45-50%, mild LVH, abnormal GLPSS at -11% with inferolateral strain and wall motion abnormalities. There is tethering of the posterior mitral leaflet and severe posteriorly directed mitral regurgitation. Restrictive diastolic dysfunction with likely elevated LV filling pressures. Severely dilated LA at 52 ml/m2, mild TR, RVSP elevated at 41 mmHg (consistent with mild pulmonary venous hypertension), trivial posterior pericardial effusion without tamponade physiology.  PFT 11/05/14:  FEV1  1.9 L FVC : 2.4 L FEV1 and FVC are reduced, but the FEV1/FVC ratio is normal.  Following adminitration of bronchodilator there was no significant response.  Consistent with minimal obstructive airway disease.    LHC 07/08/15:  Severe mitral regurgitation with large V waves and 4+ MR  There is mild to moderate left ventricular systolic dysfunction. With cardiac output/index moderate-severely reduced.  Angiographically normal coronary arteries  Cardiac MRI 08/14/15: IMPRESSION: 1) Mildly asymmetric septal hypertrophy 15 mm. Distal septal apical hypokinesis EF 45%  2) Diffuse delayed gadolinium uptake in the subendocardial and mid epicardial areas especially marked in the inferior wall  3) S/P mitral valve repair with no residual MR and annuloplasty ring  4) Atri-Clip of the LAA with no flow  5) No residual PDA flow  6) Mild LAE  7) Small pericardial effusion  8) Large left pleural effusion  Note delayed gadolinium findings may be consistent with myocarditis vs infiltrative cardiomyopathy  Echo 09/04/15: Study Conclusions  - Left ventricle: The cavity size was normal. There was mild concentric hypertrophy. Systolic function was moderately reduced. The estimated ejection fraction was in the range of 35% to 40%. Diffuse hypokinesis. - Aortic valve: Trileaflet; mildly  thickened, mildly calcified leaflets. There was trivial regurgitation. - Mitral valve: S/P mitral valve repair with annuloplasty ring. The findings are consistent with mild stenosis. Valve area by continuity equation (using LVOT flow): 1.34 cm^2. - Tricuspid valve: There was mild regurgitation. - Pulmonary arteries: PA peak pressure: 39 mm Hg (S). - Pericardium, extracardiac: There was a left pleural effusion.  Impressions:  - The right ventricular systolic pressure was increased consistent with mild pulmonary hypertension.   Recent Labs: 08/05/2015: TSH 2.455 09/03/2015: Magnesium 1.9 10/13/2015: B Natriuretic Peptide 438.6* 10/21/2015: ALT 19 11/04/2015: BUN 13; Creatinine, Ser 0.53; Hemoglobin 12.4; Platelets 228; Potassium 4.4; Sodium 138    Lipid Panel    Component Value Date/Time   CHOL 181 09/04/2015 0000      Wt Readings from Last 3 Encounters:  12/16/15 45.36 kg (100 lb)  12/05/15 47.854 kg (105 lb 8 oz)  11/14/15 46.993 kg (103 lb 9.6 oz)      ASSESSMENT AND PLAN:  # Chronic systolic and diastolic heart failure:  # Orthostatic hypotension: Diane [name] is euvolemic on exam.  She continues to require drainage of her pleurex catheter three times per week.  She has not been taking lasix, which is good because she is not eating or drinking well.  Continue midodrine and she filled out the paperwork today to start receiving Northera.  She should get it this week.   Continue ivabradine.  Once her blood pressure is better-controlled we will refer her to cardiac rehabilitation.  # Mitral regurgitation s/p mitral valve repair:  Diane [name]'s valve is now stable and has no significant regurgitation post repair.  She has been greater than 3 months of anticoagulation since her surgery, so warfarin can be discontinued from a mitral valve repair perspective. If she has no atrial fibrillation on her monitor we will stop warfarin and start aspirin 81 mg daily.  # Paroxysmal  atrial fibrillation: Diane [name] remains in sinus rhythm.  LA appendage was clipped in surgery.  There was no flow in the LAA on cardiac MRI.  She had an episode of atrial fibrillation after being shocked for VT/VF.  She has not had any reported atrial fibrillation since that time.  We will obtain a 7 day event monitor to determine whether she is still having episodes of atrial fibrillation.  If she is not, we will stop warfarin. This patients CHA2DS2-VASc Score and unadjusted Ischemic Stroke Rate (% per year) is equal to 2.2 % stroke rate/year from a score of 2  Above score calculated  as 1 point each if present [CHF, HTN, DM, Vascular=MI/PAD/Aortic Plaque, Age if 65-74, or Female] Above score calculated as 2 points each if present [Age > 75, or Stroke/TIA/TE]   # Pleural effusion: Managed by pleurex catheter placed by Dr. Cornelius Moras.  She may need VATS in the future but is too frail for more surgery at this time.   # ICD: Managed by Dr. Elberta Fortis   Current medicines are reviewed at length with the patient today.  The patient does not have concerns regarding medicines.  The following changes have been made:  no change  Labs/ tests ordered today include:   Orders Placed This Encounter  Procedures  . Cardiac event monitor  . EKG 12-Lead    Disposition:   FU with Aaliyana Fredericks C. Duke Salvia, MD in 1 month   Signed, Chilton Si, MD  12/16/2015 11:28 AM    [facility]

## 2015-12-16 ENCOUNTER — Ambulatory Visit (INDEPENDENT_AMBULATORY_CARE_PROVIDER_SITE_OTHER): Payer: BLUE CROSS/BLUE SHIELD | Admitting: Cardiovascular Disease

## 2015-12-16 ENCOUNTER — Encounter (INDEPENDENT_AMBULATORY_CARE_PROVIDER_SITE_OTHER): Payer: BLUE CROSS/BLUE SHIELD

## 2015-12-16 ENCOUNTER — Encounter: Payer: Self-pay | Admitting: Cardiovascular Disease

## 2015-12-16 ENCOUNTER — Ambulatory Visit (INDEPENDENT_AMBULATORY_CARE_PROVIDER_SITE_OTHER): Payer: BLUE CROSS/BLUE SHIELD | Admitting: Pharmacist Clinician (PhC)/ Clinical Pharmacy Specialist

## 2015-12-16 VITALS — BP 110/78 | HR 78 | Ht 61.0 in | Wt 100.0 lb

## 2015-12-16 DIAGNOSIS — Z9889 Other specified postprocedural states: Secondary | ICD-10-CM | POA: Diagnosis not present

## 2015-12-16 DIAGNOSIS — Z7901 Long term (current) use of anticoagulants: Secondary | ICD-10-CM | POA: Diagnosis not present

## 2015-12-16 DIAGNOSIS — I3139 Other pericardial effusion (noninflammatory): Secondary | ICD-10-CM

## 2015-12-16 DIAGNOSIS — I480 Paroxysmal atrial fibrillation: Secondary | ICD-10-CM | POA: Diagnosis not present

## 2015-12-16 DIAGNOSIS — I319 Disease of pericardium, unspecified: Secondary | ICD-10-CM | POA: Diagnosis not present

## 2015-12-16 DIAGNOSIS — I313 Pericardial effusion (noninflammatory): Secondary | ICD-10-CM

## 2015-12-16 DIAGNOSIS — I951 Orthostatic hypotension: Secondary | ICD-10-CM | POA: Diagnosis not present

## 2015-12-16 LAB — POCT INR: INR: 1.7

## 2015-12-16 NOTE — Patient Instructions (Addendum)
Medication Instructions:  Your physician recommends that you continue on your current medications as directed. Please refer to the Current Medication list given to you today.  Labwork: none  Testing/Procedures: Your physician has recommended that you wear an event monitor. Event monitors are medical devices that record the heart's electrical activity. Doctors most often [place] these monitors to diagnose arrhythmias. Arrhythmias are problems with the speed or rhythm of the heartbeat. The monitor is a small, portable device. You can wear one while you do your normal daily activities. This is usually used to diagnose what is causing palpitations/syncope (passing out).  Follow-Up: Your physician recommends that you schedule a follow-up appointment in: 1 month ov WE WILL CALL YOU WITH AN APPOINTMENT   If you need a refill on your cardiac medications before your next appointment, please call your pharmacy.

## 2015-12-17 ENCOUNTER — Telehealth (HOSPITAL_COMMUNITY): Payer: Self-pay

## 2015-12-17 NOTE — Telephone Encounter (Signed)
[facility] RN with Genevieve Norlander called  CHF clinic triage line to report patient was orthostatic after 175 cc drainage from pleuex cath. Sitting 80/52 Standing 64/42 Patient slightly dizzy and fatigued, but otherwise feels ok. Also received fax report that patient had a few self-reported syncopal episodes at home, completely blacking out and falling to floor and quickly coming back to.  Patient denied further intervention and said she was fine to the [facility] nurse at that time.  Patient recently saw primary cardiologist who staets patient has history of this, was euvolemic on exam, and prescribed Northera and is currently awaiting to receive the drug (hopefully this week) to treat neurogenic orthostatic hypotension.   Will forward to Dr. Gala Romney to see if anything further is needed.  Ave Filter

## 2015-12-24 ENCOUNTER — Telehealth: Payer: Self-pay | Admitting: *Deleted

## 2015-12-24 DIAGNOSIS — J90 Pleural effusion, not elsewhere classified: Secondary | ICD-10-CM | POA: Diagnosis not present

## 2015-12-24 NOTE — Telephone Encounter (Signed)
Patient was prescribed Northera by Dr Inis Sizer filled out and faxed on 5/30 Received an approval letter from [facility] at [phone]  [facility] received information from our office but no approval from [place], requested I send approval to them Faxed approval our office received as requested Was told they would still have to follow up with [employer] and would do so tomorrow and then contact patient Updated patient and asked that she call back if she does not hear from them at the end of the week

## 2015-12-26 ENCOUNTER — Other Ambulatory Visit: Payer: Self-pay | Admitting: Thoracic Surgery (Cardiothoracic Vascular Surgery)

## 2015-12-26 DIAGNOSIS — J90 Pleural effusion, not elsewhere classified: Secondary | ICD-10-CM

## 2015-12-28 DIAGNOSIS — I480 Paroxysmal atrial fibrillation: Secondary | ICD-10-CM | POA: Diagnosis not present

## 2015-12-28 DIAGNOSIS — Z9889 Other specified postprocedural states: Secondary | ICD-10-CM

## 2015-12-29 ENCOUNTER — Encounter: Payer: Self-pay | Admitting: Thoracic Surgery (Cardiothoracic Vascular Surgery)

## 2015-12-29 ENCOUNTER — Ambulatory Visit
Admission: RE | Admit: 2015-12-29 | Discharge: 2015-12-29 | Disposition: A | Discharge status: Home / Self Care | Payer: BLUE CROSS/BLUE SHIELD | Source: Ambulatory Visit | Attending: Thoracic Surgery (Cardiothoracic Vascular Surgery) | Admitting: Thoracic Surgery (Cardiothoracic Vascular Surgery)

## 2015-12-29 ENCOUNTER — Other Ambulatory Visit: Payer: Self-pay | Admitting: Thoracic Surgery (Cardiothoracic Vascular Surgery)

## 2015-12-29 ENCOUNTER — Ambulatory Visit (INDEPENDENT_AMBULATORY_CARE_PROVIDER_SITE_OTHER): Payer: BLUE CROSS/BLUE SHIELD | Admitting: Thoracic Surgery (Cardiothoracic Vascular Surgery)

## 2015-12-29 VITALS — BP 117/71 | HR 77 | Resp 18 | Ht 61.0 in | Wt 100.0 lb

## 2015-12-29 DIAGNOSIS — Z8774 Personal history of (corrected) congenital malformations of heart and circulatory system: Secondary | ICD-10-CM

## 2015-12-29 DIAGNOSIS — J90 Pleural effusion, not elsewhere classified: Secondary | ICD-10-CM

## 2015-12-29 DIAGNOSIS — Z9889 Other specified postprocedural states: Secondary | ICD-10-CM

## 2015-12-29 MED ORDER — CEPHALEXIN 500 MG PO CAPS: 500.0000 mg | ORAL_CAPSULE | Freq: Three times a day (TID) | ORAL | Status: DC

## 2015-12-29 NOTE — Patient Instructions (Signed)
Continue to drain Pleur-X catheter 3 x per week  Patient may shower with back to shower head.  Cleanse skin around catheter using antibacterial soap and water with soft wash cloth.  Keep skin dry.

## 2015-12-29 NOTE — Progress Notes (Signed)
[address]       [place] [address]             [phone]     CARDIOTHORACIC SURGERY OFFICE NOTE  Referring Provider is Leonie Man, MD PCP is Lujean Amel, MD   HPI:  Patient returns to the office today for follow-up status post placement of Pleurx catheter for recurrent left pleural effusion on 10/21/2015. She was last seen here in our office on 11/03/2015. Since then she has remained clinically stable from a respiratory standpoint. At present her Pleurx catheter is being drained 3 times a week. The volume of fluid has decreased somewhat, currently typically between 200 and 300 mL of thin serous fluid each time it is drained. She is not on a diuretic therapy because of her problems with chronic orthostatic hypotension. She still has poor appetite and she has lost weight. Her nutritional status is not good. She has not had any fevers or chills. She denies any chest pain. She has developed a rash involving the skin surrounding the Pleurx catheter exit site which corresponds to the area covered by the sponge dressing.   Current Outpatient Prescriptions  Medication Sig Dispense Refill  . ivabradine (CORLANOR) 5 MG TABS tablet Take 1 tablet (5 mg total) by mouth 2 (two) times daily with a meal. 60 tablet 6  . midodrine (PROAMATINE) 10 MG tablet 1 TABLET BY MOUTH AT 7 AM, NOON, AND BEDTIME 270 tablet 3  . pregabalin (LYRICA) 25 MG capsule Take 25 mg by mouth every other day.    . warfarin (COUMADIN) 1 MG tablet Take 1 mg by mouth daily. 2 mg every day, and 3 mg Monday, Wednesday, Friday    . Droxidopa (NORTHERA) 100 MG CAPS Take 100 mg by mouth 3 (three) times daily. (Patient not taking: Reported on 12/29/2015) 90 capsule 3   No current facility-administered medications for this visit.      Physical Exam:   BP 117/71 mmHg  Pulse 77  Resp 18  Ht '5\' 1"'  (1.549 m)  Wt 100 lb (45.36 kg)  BMI 18.90 kg/m2  SpO2 99%  General:  Thin and malnourished but  otherwise well-appearing  Chest:   Clear to auscultation  CV:   Regular rate and rhythm without murmur  Incisions:  Pleurx catheter exit site intact but maculopapular rash involving the skin surrounding this region consistent with dermatitis  Abdomen:  Soft and nontender  Extremities:  Warm and well perfused  Diagnostic Tests:  CHEST 2 VIEW  COMPARISON: PA and lateral chest 11/03/2015.  FINDINGS: AICD is again seen and unchanged. The patient is status post mitral valve repair and clipping of the atrial appendage. Heart size is normal. Lungs are clear. No pneumothorax or pleural effusion. Eight intact median sternotomy wires are unchanged.  IMPRESSION: No acute disease.   Electronically Signed  By: Inge Rise M.D.  On: 12/29/2015 10:07   Impression:  Patient is clinically stable from a respiratory standpoint with functional left Pleurx catheter in place. The catheter continues to drain serous fluid although the volume has slowly diminished over the past month. The patient remains clinically malnourished and I suspect her serum albumin is quite low. This undoubtedly contributes to the continued drainage. The patient has recently developed some dermatitis involving the skin surrounding the Pleurx catheter exit site.  Plan:  I have given the patient a prescription for oral Keflex for 7 days. She has been instructed to cleanse the skin with soap  and water and to keep this area dry. They have been instructed to use dry gauze dressing rather than the sponge dressing that comes with the kit. We will have the patient return in 4 weeks for follow-up. She will call and return sooner if the rash does not resolve or if other problems develop.  I spent in excess of 15 minutes during the conduct of this office consultation and >50% of this time involved direct face-to-face encounter with the patient for counseling and/or coordination of their care.    Valentina Gu. Roxy Manns,  MD 12/29/2015 11:21 AM

## 2015-12-30 ENCOUNTER — Telehealth: Payer: Self-pay | Admitting: *Deleted

## 2015-12-30 ENCOUNTER — Ambulatory Visit (INDEPENDENT_AMBULATORY_CARE_PROVIDER_SITE_OTHER): Payer: BLUE CROSS/BLUE SHIELD | Admitting: *Deleted

## 2015-12-30 DIAGNOSIS — Z7901 Long term (current) use of anticoagulants: Secondary | ICD-10-CM | POA: Diagnosis not present

## 2015-12-30 DIAGNOSIS — Z9889 Other specified postprocedural states: Secondary | ICD-10-CM

## 2015-12-30 LAB — POCT INR: INR: 1.5

## 2015-12-30 NOTE — Telephone Encounter (Signed)
Received paperwork confirming PA process was successfully completed and northera coverage is approved until 07/18/2038.

## 2016-01-02 ENCOUNTER — Telehealth: Payer: Self-pay | Admitting: *Deleted

## 2016-01-02 ENCOUNTER — Encounter: Payer: Self-pay | Admitting: *Deleted

## 2016-01-02 ENCOUNTER — Telehealth: Payer: Self-pay | Admitting: Cardiovascular Disease

## 2016-01-02 LAB — BODY FLUID CULTURE
Gram Stain: NONE SEEN
Organism ID, Bacteria: NO GROWTH

## 2016-01-02 MED ORDER — WARFARIN SODIUM 4 MG PO TABS: 4.0000 mg | ORAL_TABLET | Freq: Every day | ORAL | Status: DC

## 2016-01-02 NOTE — Telephone Encounter (Signed)
Pt called to inform us that the she will finish using 1mg  tablets which she will run out of on Saturday.; she stated she preferred to use the 4mg  once refilled, advised that I will send in the 4mg  tablets today to [facility] off [address] as instructed.  Refill sent to requested pharmacy.

## 2016-01-02 NOTE — Telephone Encounter (Signed)
Hit "close encounter" in error  Message routed to Dr. Duke Salvia to review monitor - scanned in to EPIC  Routed to coumadin pool to refill warfarin

## 2016-01-02 NOTE — Telephone Encounter (Signed)
Follow Up  ° °Pt returned the call  °

## 2016-01-02 NOTE — Telephone Encounter (Signed)
No results for monitor yet. No recommendation to stop warfarin.  Left msg to call.

## 2016-01-02 NOTE — Telephone Encounter (Signed)
LMOM for pt to call because we have 4mg  tab in anticog note and 1mg  in med list, we need clarification.

## 2016-01-02 NOTE — Telephone Encounter (Signed)
New message      Calling to get monitor results and can pt stop warfarin?  She only has enough medication to last thru the weekend and really do not want to refill it.  Please call

## 2016-01-05 ENCOUNTER — Other Ambulatory Visit: Payer: Self-pay | Admitting: *Deleted

## 2016-01-05 DIAGNOSIS — R21 Rash and other nonspecific skin eruption: Secondary | ICD-10-CM

## 2016-01-05 MED ORDER — NYSTATIN-TRIAMCINOLONE 100000-0.1 UNIT/GM-% EX OINT
1.0000 | TOPICAL_OINTMENT | Freq: Two times a day (BID) | CUTANEOUS | Status: DC
Start: 2016-01-05 — End: 2016-01-08

## 2016-01-05 NOTE — Telephone Encounter (Signed)
Follow-up   Returning the nurses call, no other information given

## 2016-01-05 NOTE — Telephone Encounter (Signed)
This encounter was created in error - please disregard.

## 2016-01-06 ENCOUNTER — Telehealth: Payer: Self-pay | Admitting: Cardiovascular Disease

## 2016-01-06 NOTE — Telephone Encounter (Signed)
Spoke w/ patient and gave her results of monitor. She voiced understanding. Also notes she got authorization notification for the Northera.  Patient states she thought the monitor was to see if she needed to remain on coumadin. Informed her per Dr. Leonides Sake notes this was related to her symptoms of dizziness and to see whether related to a heart rhythm issue. Coumadin therapy was not mentioned in recent notes.  She has f/u Thursday and a coumadin clinic visit Fri. Will route to see if any further advice recommended in interim.

## 2016-01-06 NOTE — Telephone Encounter (Signed)
Follow-up ° ° ° ° °The pt is returning the nurses call °

## 2016-01-06 NOTE — Telephone Encounter (Signed)
Spoke with patient and advised to keep follow up Thursday as scheduled

## 2016-01-08 ENCOUNTER — Ambulatory Visit (INDEPENDENT_AMBULATORY_CARE_PROVIDER_SITE_OTHER): Payer: BLUE CROSS/BLUE SHIELD | Admitting: Cardiovascular Disease

## 2016-01-08 ENCOUNTER — Encounter: Payer: Self-pay | Admitting: Cardiovascular Disease

## 2016-01-08 ENCOUNTER — Telehealth: Payer: Self-pay | Admitting: Cardiovascular Disease

## 2016-01-08 VITALS — BP 113/66 | HR 81 | Ht 61.0 in | Wt 101.8 lb

## 2016-01-08 DIAGNOSIS — Z9889 Other specified postprocedural states: Secondary | ICD-10-CM

## 2016-01-08 DIAGNOSIS — J90 Pleural effusion, not elsewhere classified: Secondary | ICD-10-CM

## 2016-01-08 DIAGNOSIS — I480 Paroxysmal atrial fibrillation: Secondary | ICD-10-CM

## 2016-01-08 DIAGNOSIS — J948 Other specified pleural conditions: Secondary | ICD-10-CM

## 2016-01-08 DIAGNOSIS — I951 Orthostatic hypotension: Secondary | ICD-10-CM

## 2016-01-08 DIAGNOSIS — I5042 Chronic combined systolic (congestive) and diastolic (congestive) heart failure: Secondary | ICD-10-CM | POA: Diagnosis not present

## 2016-01-08 MED ORDER — FLUCONAZOLE 150 MG PO TABS: 150.0000 mg | ORAL_TABLET | Freq: Once | ORAL | Status: DC

## 2016-01-08 NOTE — Telephone Encounter (Signed)
Returned call to [place] and notified her of Belenda Cruise, PharmD advice. No further action needed.

## 2016-01-08 NOTE — Telephone Encounter (Signed)
Pt c/o medication issue:  1. Name of Medication: Fluconazole 150mg   2. How are you currently taking this medication (dosage and times per day)? 1 tab po daily  3. Are you having a reaction (difficulty breathing--STAT)? Not yet  4. What is your medication issue?  This drug interactions negatively with Warfarin

## 2016-01-08 NOTE — Patient Instructions (Addendum)
Medication Instructions:  STOP WARFARIN  STOP MIDODRINE   START ASPIRIN 81 MG DAILY   RX FOR FLUCONAZOLE 150 MG ONE TIME ONLY HAS BEEN SENT TO YOUR PHARMACY  Labwork: NONE  Testing/Procedures: NONE  Follow-Up: Your physician recommends that you schedule a follow-up appointment in: 3 MONTH OV  If you need a refill on your cardiac medications before your next appointment, please call your pharmacy.

## 2016-01-08 NOTE — Progress Notes (Signed)
Cardiology Office Note   Date:  01/08/2016   ID:  Diane [name], DOB [birth year], MRN [id number]  PCP:  Darrow Bussing, MD  Cardiologist:   Chilton Si, MD  Electrophysiologist: Dr. Berton Mount  Chief Complaint  Patient presents with  . Follow-up    1 month; sob; frequently     [name] is a 55 y.o. female who presents for follow up on mitral valve repair and chronic systolic and diastolic heart failure.   History of Present Illness Diane [name] initially presented 05/2015 with recurrent syncope and progressive shortness of breath.  She previously had an exercise Myoview 07/2013 that was negative for ischemia.  She underwent echocardiography 05/2015 that revealed severe mitral regurgitation and a reduced LVEF (45-50%). The MR was due to tethering of the posterior leaflet. She was also noted to have grade 3 diastolic dysfunction with mid basal to mid inferolateral wall hypokinesis.  Diane [name] underwent repair of her mitral valve with a 26 mm Sorin Memo 3D annuloplasty ring on 07/22/15. Intra-operatively she was noted to have a PDA with left to right shunting that was ligated in the OR. The left atrium was also clipped. Her post operative course was initially unremarkable and she was discharged on post-op day 5. On 1/17 she suffered a cardiac arrest at home and was resuscitated by EMS. The initial rhythm was unclear but she had VT upon arrival in the ED, which was followed by atrial fibrillation. She was treated with hypothermia and has no residual cognitive deficits. During that hospitalization she underwent cardiac MRI with LVEF 45% with hypokinesis of the distal septum and apex. There was diffuse, delayed enhancement in the subendocardium and mid epicardial region, most prominent in the inferior wall. This was concerning for myocarditis or infiltrative cardiomyopathy. Her EKG showed QT prolongation and she had an ICD placed for secondary prevention. Her  hospitalization was delayed by severe orthostatic hypotension and recurrent syncope requiring high doses of florinef and midodrine. She was discharged home but was readmitted with volume overload on 2/16. Echo showed LVEF 35-40% with diffuse hypokinesis and her mitral valve was functioning well.   Since her initial hospitalization she has struggled with hypotension and recurrent L pleural effusion.  She had a pleurex catheter placed in the L chest.  She drains 150-200 mL three times per week.  She was started on ivabradine 2.5 mg bid on 4/20 and the dose was increased to 5 mg on 12/05/15.  She continued to have dizziness and orthostatic hypotension so she was started on Northera.  Florinef was previously discontinued due to edema. Northera was ordered and she just received it yesterday.  Since then she has been feeling better.  She notes that her blood pressure has improved and she does not feel lightheaded.  She was able to go for a walk and felt weak due to muscle fatigue but didn't have lightheadedness.  She also notes an improvement in her appetite.  She denies lower extremity edema, orthopnea or PND.  Diane [name] wore an event monitor on 12/22/15 that did not reveal any abnormalities.  Diane [name] reports a rash around her Pleurex catheter site.  She previously was wearing a bandage over the site that has since been removed.  She saw Dr. Cornelius Moras and was prescribed an antibiotic, but this did not help.    Past Medical History  Diagnosis Date  . Severe mitral regurgitation 07/03/2015  . Chronic combined systolic and diastolic CHF (congestive heart failure) (HCC) 07/03/2015  Grade 3 diastolic dysfunction.  LVEF 40-45% 05/2015.  Marland Kitchen Aortic aneurysm (HCC)   . Patent ductus arteriosus 07/16/2015  . Depression     SOME DEPRESSION (ON MEDS FOR 2 YRS) WHEN HER MOTHER PASSED  . 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 07/22/2015    26 mm Sorin Memo 3D ring annuloplasty with ligation of  patent ductus arteriosus and clipping of LA appendage  . Cardiac arrest (HCC)   . AICD (automatic cardioverter/defibrillator) present   . Shortness of breath dyspnea   . Anxiety   . Arthritis   . Anemia     Past Surgical History  Procedure Laterality Date  . Ovarian cyst removal    . Tee without cardioversion N/A 07/07/2015    Procedure: TRANSESOPHAGEAL ECHOCARDIOGRAM (TEE);  Surgeon: Chilton Si, MD;  Location: [facility] ENDOSCOPY;  Service: Cardiovascular;  Laterality: N/A;  . Cardiac catheterization N/A 07/08/2015    Procedure: Right/Left Heart Cath and Coronary Angiography;  Surgeon: Marykay Lex, MD;  Location: [facility] INVASIVE CV LAB;  Service: Cardiovascular;  Laterality: N/A;  . Mitral valve repair N/A 07/22/2015    Procedure: MITRAL VALVE REPAIR;  Surgeon: Purcell Nails, MD;  Location: [facility] OR;  Service: Open Heart Surgery;  Laterality: N/A;  26 Sorin Memo 3D  . Tee without cardioversion N/A 07/22/2015    Procedure: TRANSESOPHAGEAL ECHOCARDIOGRAM (TEE);  Surgeon: Purcell Nails, MD;  Location: [facility] OR;  Service: Open Heart Surgery;  Laterality: N/A;  . Patent ductus arterious repair N/A 07/22/2015    Procedure: PATENT DUCTUS ARTERIOSUS (PDA) ligation;  Surgeon: Purcell Nails, MD;  Location: [facility] OR;  Service: Open Heart Surgery;  Laterality: N/A;  . Clipping of atrial appendage  07/22/2015    Procedure: CLIPPING OF LEFT ATRIAL APPENDAGE;  Surgeon: Purcell Nails, MD;  Location: [facility] OR;  Service: Open Heart Surgery;;  35 Atriclip Pro  . Ep implantable device N/A 08/15/2015    Procedure: ICD Implant;  Surgeon: Will Jorja Loa, MD;  Location: [facility] INVASIVE CV LAB;  Service: Cardiovascular;  Laterality: N/A;  . Colonoscopy    . Chest tube insertion Left 10/21/2015    Procedure: INSERTION left PLEURAL DRAINAGE CATHETER;  Surgeon: Purcell Nails, MD;  Location: [facility] OR;  Service: Thoracic;  Laterality: Left;  Marland Kitchen Microlaryngoscopy with co2 laser and excision of vocal cord lesion N/A 11/04/2015     Procedure: MICROLARYNGOSCOPY, RADIESSE VOCAL CORD AUGMENTATION;  Surgeon: Flo Shanks, MD;  Location: [facility] OR;  Service: ENT;  Laterality: N/A;     Current Outpatient Prescriptions  Medication Sig Dispense Refill  . Droxidopa (NORTHERA) 100 MG CAPS Take 100 mg by mouth 3 (three) times daily. 90 capsule 3  . ivabradine (CORLANOR) 5 MG TABS tablet Take 1 tablet (5 mg total) by mouth 2 (two) times daily with a meal. 60 tablet 6  . midodrine (PROAMATINE) 10 MG tablet 1 TABLET BY MOUTH AT 7 AM, NOON, AND BEDTIME 270 tablet 3  . pregabalin (LYRICA) 25 MG capsule Take 25 mg by mouth every other day.    . warfarin (COUMADIN) 4 MG tablet Take 1 tablet (4 mg total) by mouth daily. 35 tablet 3   No current facility-administered medications for this visit.    Allergies:   Vicodin; Adhesive; Cymbalta; Oxycodone; and Silicone    Social History:  The patient  reports that she has never smoked. She has never used smokeless tobacco. She reports that she does not drink alcohol or use illicit drugs.  Family History:  The patient's family history includes Hypertension in her mother.    ROS:  Please see the history of present illness.  Otherwise, review of systems are positive for none.   All other systems are reviewed and negative.    PHYSICAL EXAM: VS:  BP 113/66 mmHg  Pulse 81  Ht 5\' 1"  (1.549 m)  Wt 101 lb 12.8 oz (46.176 kg)  BMI 19.24 kg/m2 , BMI Body mass index is 19.24 kg/(m^2). GENERAL:  Chronically ill-appearing.  Appears mildly ill and weak.  Voice is stronger. HEENT:  Pupils equal round and reactive, fundi not visualized, oral mucosa unremarkable.  Hoarse voice NECK:  No jugular venous distention, waveform within normal limits, carotid upstroke brisk and symmetric, no bruits, no thyromegaly LYMPHATICS:  No cervical adenopathy LUNGS:  Clear to auscultation bilaterally. HEART:  RRR.  PMI not displaced or sustained,S1 and S2 within normal limits, no S3, no S4, no clicks, no rubs, no  murmurs Chest: L pleurex catheter.  Erythematous, vesicular rash around the catheter. ABD:  Flat, positive bowel sounds normal in frequency in pitch, no bruits, no rebound, no guarding, no midline pulsatile mass, no hepatomegaly, no splenomegaly EXT:  2 plus pulses throughout, no edema, no cyanosis no clubbing SKIN:  No rashes no nodules NEURO:  Cranial nerves II through XII grossly intact, motor grossly intact throughout PSYCH:  Cognitively intact, oriented to person place and time  EKG:  EKG is not ordered today.   12/06/15: Sinus rhythm. Rate 78 bpm. Left bundle-branch block.  Left ventricular hypertrophy. Left anterior fascicular block.   Echo 06/04/15: Study Conclusions  - Left ventricle: The cavity size was normal. Wall thickness was increased in a pattern of mild LVH. Systolic function was mildly reduced. The estimated ejection fraction was in the range of 45% to 50%. Abnormal GLPSS at -11%, with inferior and inferolateral strain abnormality. Basal to mid inferolateral wall hypokinesis. Doppler parameters are consistent with restrictive left ventricular relaxation (grade 3 diastolic dysfunction). The E/A ratio is >2.5. The E/e&' ratio is >20, suggesting markedly elevated LV filling pressure. - Mitral valve: Mildly thickened leaflets with tethering of the posterior leaflet. There is severe, posteriorly directed regurgitation which hugs the atrial free wall. Reversal of flow is seen in the pulmonary vein. - Left atrium: Severely dilated at 52 ml/m2. - Right atrium: The atrium was mildly dilated. - Tricuspid valve: There was mild regurgitation. - Pulmonary arteries: PA peak pressure: 41 mm Hg (S). - Inferior vena cava: The vessel was normal in size. The respirophasic diameter changes were in the normal range (>= 50%), consistent with normal central venous pressure. - Pericardium, extracardiac: A trivial pericardial effusion was identified posterior to  the heart. Features were not consistent with tamponade physiology.  Impressions:  - LVEF 45-50%, mild LVH, abnormal GLPSS at -11% with inferolateral strain and wall motion abnormalities. There is tethering of the posterior mitral leaflet and severe posteriorly directed mitral regurgitation. Restrictive diastolic dysfunction with likely elevated LV filling pressures. Severely dilated LA at 52 ml/m2, mild TR, RVSP elevated at 41 mmHg (consistent with mild pulmonary venous hypertension), trivial posterior pericardial effusion without tamponade physiology.  PFT 11/05/14:  FEV1  1.9 L FVC : 2.4 L FEV1 and FVC are reduced, but the FEV1/FVC ratio is normal.  Following adminitration of bronchodilator there was no significant response.  Consistent with minimal obstructive airway disease.    LHC 07/08/15:  Severe mitral regurgitation with large V waves and 4+ MR  There is mild to  moderate left ventricular systolic dysfunction. With cardiac output/index moderate-severely reduced.  Angiographically normal coronary arteries  Cardiac MRI 08/14/15: IMPRESSION: 1) Mildly asymmetric septal hypertrophy 15 mm. Distal septal apical hypokinesis EF 45%  2) Diffuse delayed gadolinium uptake in the subendocardial and mid epicardial areas especially marked in the inferior wall  3) S/P mitral valve repair with no residual MR and annuloplasty ring  4) Atri-Clip of the LAA with no flow  5) No residual PDA flow  6) Mild LAE  7) Small pericardial effusion  8) Large left pleural effusion  Note delayed gadolinium findings may be consistent with myocarditis vs infiltrative cardiomyopathy  Echo 09/04/15: Study Conclusions  - Left ventricle: The cavity size was normal. There was mild concentric hypertrophy. Systolic function was moderately reduced. The estimated ejection fraction was in the range of 35% to 40%. Diffuse hypokinesis. - Aortic valve: Trileaflet; mildly  thickened, mildly calcified leaflets. There was trivial regurgitation. - Mitral valve: S/P mitral valve repair with annuloplasty ring. The findings are consistent with mild stenosis. Valve area by continuity equation (using LVOT flow): 1.34 cm^2. - Tricuspid valve: There was mild regurgitation. - Pulmonary arteries: PA peak pressure: 39 mm Hg (S). - Pericardium, extracardiac: There was a left pleural effusion.  Impressions:  - The right ventricular systolic pressure was increased consistent with mild pulmonary hypertension.   Recent Labs: 08/05/2015: TSH 2.455 09/03/2015: Magnesium 1.9 10/13/2015: B Natriuretic Peptide 438.6* 10/21/2015: ALT 19 11/04/2015: BUN 13; Creatinine, Ser 0.53; Hemoglobin 12.4; Platelets 228; Potassium 4.4; Sodium 138    Lipid Panel    Component Value Date/Time   CHOL 181 09/04/2015 0000      Wt Readings from Last 3 Encounters:  01/08/16 101 lb 12.8 oz (46.176 kg)  12/29/15 100 lb (45.36 kg)  12/16/15 100 lb (45.36 kg)      ASSESSMENT AND PLAN:  # Chronic systolic and diastolic heart failure:  # Orthostatic hypotension: Diane [name] is euvolemic on exam.  She continues to require drainage of her pleurex catheter three times per week, though the volume is decreasing.  Orthostatic hypotension has improved.  Continue Northera.  She will try to stop midodrine.  Continue ivabradine.    # Mitral regurgitation s/p mitral valve repair:  Stop warfarin and start aspirin 81mg  daily.    # Paroxysmal atrial fibrillation: Diane [name] remains in sinus rhythm.  LA appendage was clipped in surgery.  There was no flow in the LAA on cardiac MRI.  She had an episode of atrial fibrillation after being shocked for VT/VF.  Seven day event monitor did not reveal any atrial fibrillation.  Stop warfarin.   # Pleural effusion: Managed by pleurex catheter placed by Dr. Cornelius Moras.  Her rash did not improve with antibiotics.  We will try Fluconazole 150 mg po x1.   # ICD:  Managed by Dr. Elberta Fortis   Current medicines are reviewed at length with the patient today.  The patient does not have concerns regarding medicines.  The following changes have been made:  Stop warfarin.  Stop midodrine.  Increase northera.   Labs/ tests ordered today include:   No orders of the defined types were placed in this encounter.    Disposition:   FU with Diane [name] C. Duke Salvia, MD in 3 months   Signed, Chilton Si, MD  01/08/2016 1:47 PM    [name]

## 2016-01-08 NOTE — Telephone Encounter (Signed)
Message routed to clinical pharmacist to review.  Fluconazole was ordered as a ONE time dose today 01/08/16 by MD

## 2016-01-08 NOTE — Telephone Encounter (Signed)
Ok for 1 x use, if needs to use more frequently she should call [facility] for INR check

## 2016-01-14 ENCOUNTER — Telehealth: Payer: Self-pay | Admitting: Cardiovascular Disease

## 2016-01-14 DIAGNOSIS — R55 Syncope and collapse: Secondary | ICD-10-CM

## 2016-01-14 NOTE — Telephone Encounter (Signed)
Spoke to [place], Charity fundraiser at Dr. Glendale Chard office. She states she does not have a record of the call and that Dr. Docia Chuck may have called directly, but that he is in a patient room and would need to call us back. I left physician line number for physician to call.

## 2016-01-14 NOTE — Telephone Encounter (Signed)
New message       Calling to let the doctor know that pt was seen this am.  Her bp was 80/40.  She recently was started on northera 100mg .  The doctor at [facility] want Dr Duke Salvia to be aware of low bp and possible follow up with pt

## 2016-01-21 ENCOUNTER — Telehealth: Payer: Self-pay | Admitting: Nurse Practitioner

## 2016-01-21 ENCOUNTER — Encounter (HOSPITAL_COMMUNITY): Payer: BLUE CROSS/BLUE SHIELD | Admitting: Internal Medicine

## 2016-01-21 NOTE — Telephone Encounter (Signed)
   Pt called again this evening to report that she would like to begin weaning down her Northera dose.  She feels that it is causing her to feel worse and more weak and short of breath since it has been uptitrated to 600 TID.  I reviewed prior phone notes and clinic visit.  I advised that she may come back down on northera to 300 TID and warned that she will have to be careful with position changes as she may become more orthostatic on a lower dose.  She is already on midodrine and was previously on florinef, however this was discontinued previously due to edema.  I further advised that I will reach out to our Triage nurse to arrange for office f/u sooner than currently scheduled ([place] 8/8, Duke Salvia 9/19) and preferably within the next few days.  Caller verbalized understanding and was grateful for the call back.  Nicolasa Ducking, NP 01/21/2016, 7:21 PM

## 2016-01-21 NOTE — Telephone Encounter (Signed)
Follow up       Pt c/o Syncope: STAT if syncope occurred within 30 minutes and pt complains of lightheadedness High Priority if episode of passing out, completely, today or in last 24 hours   1. Did you pass out today?  no  When is the last time you passed out? yesterday Has this occurred multiple times? Yes, passed out 4 times within the last week 2. Did you have any symptoms prior to passing out? Pt faints after standing up. She is taking northera 100mg ---could this be the problem?

## 2016-01-21 NOTE — Telephone Encounter (Addendum)
Returned call to patient. Complex medical history best summarized in recent office visit notes by Dr. Duke Salvia, noteable for MV repair on 1/3 and unwitnessed MI 1/17 of this year.  She had successful post-arrest rescusitation by EMS, presented to ED in [place] -> A Fib.  Underwent therapeutic hypothermia w/ no lingering cognitive deficits.  She is followed in HF clinic for chronic systolic and diastolic HF and infiltrative cardiomyopathy. She also has a pleurX catheter for pleural effusion management which is managed by Dr. Barry Dienes. Also significant is an established history of syncope going back at least 2 years.  Note that she has had ongoing weakness, nausea, dizziness, near-syncope and syncope. To address this, she was started on northera recently and discontinued from midodrine.  Patient reports to me that she has had several falls w/in past 2 weeks and in her opinion, this has gotten worse since starting Northera/uptitration of her dose. She notes she was started at 100mg  TID, and currently at the max dose of 600mg  TID. She recounts 4 times she has gotten up to walk somewhere in the house, she wakes up finding herself on the floor with pain in hips/sides. Patient denies striking her head. Notes the syncopal events happen unpredictably. She has not gone to the ED recently to address this, and at present refuses recommendation of ED evaluation.  Patient reports adequate fluid intake (4x 16 oz bottles of water daily) and notes adequate nutrition. Notes "I feel incredibly weak".  Her BP was 154/97 yesterday around noon, she notes this is high for her. I asked if she has had any unusual elevations when supine, she denies.  She notes typically BP runs anywhere from 100-150 systolic. She has had episodes of hypotension where she has obtained systolic readings at home of 50-70. Notes she is not sure how long these last, she is not able to do anything but rest until these resolve.  She notes her BP  tends to run lower in the morning, if at all. She doesn't identify any other clear trends or patterns.  Dr. Duke Salvia currently out of office. Routed to [employer] to address. Since after 5pm, I will consult w/ flex scheduler for availability tomorrow.

## 2016-01-21 NOTE — Telephone Encounter (Signed)
FLEX clinic is appropriate - may have to wean down Northera if she feels worse and continues to have episodes or consider adding florinef, if she is not already on it.  Dr. Rexene Edison

## 2016-01-22 ENCOUNTER — Ambulatory Visit (INDEPENDENT_AMBULATORY_CARE_PROVIDER_SITE_OTHER): Payer: BLUE CROSS/BLUE SHIELD | Admitting: Physician Assistant

## 2016-01-22 ENCOUNTER — Encounter: Payer: Self-pay | Admitting: Physician Assistant

## 2016-01-22 VITALS — BP 64/42 | HR 81 | Ht 61.0 in | Wt 100.0 lb

## 2016-01-22 DIAGNOSIS — G909 Disorder of the autonomic nervous system, unspecified: Secondary | ICD-10-CM

## 2016-01-22 MED ORDER — MIDODRINE HCL 10 MG PO TABS
10.0000 mg | ORAL_TABLET | Freq: Three times a day (TID) | ORAL | Status: DC
Start: 2016-01-22 — End: 2016-02-05

## 2016-01-22 NOTE — Telephone Encounter (Signed)
See Gilford Raid on-call note regarding recommendations on med changes (specifically the Northera titration) and f/u OV. I spoke w/ patient this AM to confirm a flex appt for this afternoon (added by Amelia Jo). She states she continues to feel weak this AM but is aware the benefit to the med adjustments will not be immediate. She confirmed that she can be at the clinic at [facility] this afternoon at 3:30 to see Carlean Jews.  I have discussed the patient situation w Victorino Dike, Katie's MA.

## 2016-01-22 NOTE — Patient Instructions (Signed)
Medication Instructions:  Your physician recommends that you continue on your current medications as directed. Please refer to the Current Medication list given to you today.]   Labwork: TODAY:  CORTISOL & PROLACTIN  Testing/Procedures: None ordered  Follow-Up: Your physician recommends that you schedule a follow-up appointment in: 2 WEEKS WITH DR. Graciela Husbands   Any Other Special Instructions Will Be Listed Below (If Applicable).     If you need a refill on your cardiac medications before your next appointment, please call your pharmacy.

## 2016-01-22 NOTE — Progress Notes (Signed)
Cardiology Office Note    Date:  01/22/2016   ID:  Diane [name], DOB 1961-02-02, MRN [id number]  PCP:  Darrow Bussing, MD  Cardiologist: Chilton Si, MD  Electrophysiologist: Dr. Berton Mount  CC: weakness/SOB caused by Lorin Picket.   History of Present Illness:  Diane [name] is a 55 y.o. female with a history of severe MR s/p MV repair with LAA clipping (07/2015), PDA (L->R shunting) s/p ligation during MVR, cardiac arrest with VT s/p ICD for secondary prevention, PAF, LV dysfunction (EF 45%), severe orthostatic hypotension and recurrent syncope (florinef and midodrine caused CHF) now on Northera who presents to clinic for evaluation of weakness/SOB caused by Northera.   Diane [name] initially presented 05/2015 with recurrent syncope and progressive shortness of breath. She previously had an exercise Myoview 07/2013 that was negative for ischemia. She underwent echocardiography 05/2015 that revealed severe mitral regurgitation and a reduced LVEF (45-50%). The MR was due to tethering of the posterior leaflet. She was also noted to have grade 3 diastolic dysfunction with mid basal to mid inferolateral wall hypokinesis. Diane [name] underwent repair of her mitral valve with a 26 mm Sorin Memo 3D annuloplasty ring on 07/22/15. Intra-operatively she was noted to have a PDA with left to right shunting that was ligated in the OR. The left atrium was also clipped.Her post operative course was initially unremarkable and she was discharged on post-op day 5. On 1/17 she suffered a cardiac arrest at home and was resuscitated by EMS.The initial rhythm was unclear but she had VT upon arrival in the ED, which was followed by atrial fibrillation. She was treated with hypothermia and has no residual cognitive deficits. During that hospitalization she underwent cardiac MRI with LVEF 45% with hypokinesis of the distal septum and apex. There was diffuse, delayed enhancement in the subendocardium and mid  epicardial region, most prominent in the inferior wall. This was concerning for myocarditis or infiltrative cardiomyopathy. Her EKG showed QT prolongation and she had an ICD placed for secondary prevention. Her hospitalization was delayed by severe orthostatic hypotension and recurrent syncope requiring high doses of florinef and midodrine. She was discharged home but was readmitted with volume overload on 2/16. Echo showed LVEF 35-40% with diffuse hypokinesis and her mitral valve was functioning well.   Since her initial hospitalization she has struggled with hypotension and recurrent L pleural effusion. She had a pleurex catheter placed in the L chest. She drains 150-200 mL three times per week. She was started on ivabradine 2.5 mg bid on 4/20 and the dose was increased to 5 mg on 12/05/15. She continued to have dizziness and orthostatic hypotension so she was started on Northera. Florinef was previously discontinued due to edema. Diane [name] wore an event monitor on 12/22/15 that did not reveal any abnormalities. Coumadin was stopped since she had LAA clipping and no recurrence of afib. She was seen by Dr. Presley Raddle on 01/08/16 and seemed to be doing better on Northera 100 TID.   Per review of phone note on 01/21/16 she wants to begin weaning down her Northera dose. She felt that it was causing her to feel worse and more weak and short of breath since it has been uptitrated to 600 TID.Ward Givens advised that she may come back down on northera to 300 TID and warned that she will have to be careful with position changes as she may become more orthostatic on a lower dose. She was added onto my clinic schedule for further eval and treatment.  She states that she had done pretty well on the 100mg  TID dose but as soon as it was titrated up she has felt much worse and had more syncope and dizziness. She feels weak and SOB. She has lost more weight due to not feeling well. She lays and sits all day because  she cannot stand up.  Still has pleurex cath in place that is draining about 100cc's every other day which is much better than previous )   Past Medical History  Diagnosis Date  . Severe mitral regurgitation 07/03/2015  . Chronic combined systolic and diastolic CHF (congestive heart failure) (HCC) 07/03/2015    Grade 3 diastolic dysfunction.  LVEF 40-45% 05/2015.  Marland Kitchen Aortic aneurysm (HCC)   . Patent ductus arteriosus 07/16/2015  . Depression     SOME DEPRESSION (ON MEDS FOR 2 YRS) WHEN HER MOTHER PASSED  . 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 07/22/2015    26 mm Sorin Memo 3D ring annuloplasty with ligation of patent ductus arteriosus and clipping of LA appendage  . Cardiac arrest (HCC)   . AICD (automatic cardioverter/defibrillator) present   . Shortness of breath dyspnea   . Anxiety   . Arthritis   . Anemia     Past Surgical History  Procedure Laterality Date  . Ovarian cyst removal    . Tee without cardioversion N/A 07/07/2015    Procedure: TRANSESOPHAGEAL ECHOCARDIOGRAM (TEE);  Surgeon: Chilton Si, MD;  Location: [facility] ENDOSCOPY;  Service: Cardiovascular;  Laterality: N/A;  . Cardiac catheterization N/A 07/08/2015    Procedure: Right/Left Heart Cath and Coronary Angiography;  Surgeon: Marykay Lex, MD;  Location: [facility] INVASIVE CV LAB;  Service: Cardiovascular;  Laterality: N/A;  . Mitral valve repair N/A 07/22/2015    Procedure: MITRAL VALVE REPAIR;  Surgeon: Purcell Nails, MD;  Location: [facility] OR;  Service: Open Heart Surgery;  Laterality: N/A;  26 Sorin Memo 3D  . Tee without cardioversion N/A 07/22/2015    Procedure: TRANSESOPHAGEAL ECHOCARDIOGRAM (TEE);  Surgeon: Purcell Nails, MD;  Location: [facility] OR;  Service: Open Heart Surgery;  Laterality: N/A;  . Patent ductus arterious repair N/A 07/22/2015    Procedure: PATENT DUCTUS ARTERIOSUS (PDA) ligation;  Surgeon: Purcell Nails, MD;  Location: [facility] OR;  Service: Open Heart Surgery;  Laterality: N/A;  .  Clipping of atrial appendage  07/22/2015    Procedure: CLIPPING OF LEFT ATRIAL APPENDAGE;  Surgeon: Purcell Nails, MD;  Location: [facility] OR;  Service: Open Heart Surgery;;  35 Atriclip Pro  . Ep implantable device N/A 08/15/2015    Procedure: ICD Implant;  Surgeon: Will Jorja Loa, MD;  Location: [facility] INVASIVE CV LAB;  Service: Cardiovascular;  Laterality: N/A;  . Colonoscopy    . Chest tube insertion Left 10/21/2015    Procedure: INSERTION left PLEURAL DRAINAGE CATHETER;  Surgeon: Purcell Nails, MD;  Location: [facility] OR;  Service: Thoracic;  Laterality: Left;  Marland Kitchen Microlaryngoscopy with co2 laser and excision of vocal cord lesion N/A 11/04/2015    Procedure: MICROLARYNGOSCOPY, RADIESSE VOCAL CORD AUGMENTATION;  Surgeon: Flo Shanks, MD;  Location: [facility] OR;  Service: ENT;  Laterality: N/A;    Current Medications: Outpatient Prescriptions Prior to Visit  Medication Sig Dispense Refill  . aspirin 81 MG tablet Take 81 mg by mouth daily.    . Droxidopa (NORTHERA) 100 MG CAPS Take 100 mg by mouth 3 (three) times daily. 90 capsule 3  . ivabradine (CORLANOR) 5 MG TABS tablet Take 1  tablet (5 mg total) by mouth 2 (two) times daily with a meal. 60 tablet 6  . pregabalin (LYRICA) 25 MG capsule Take 25 mg by mouth as directed. Pt takes 25 mg bi-weekly    . fluconazole (DIFLUCAN) 150 MG tablet Take 1 tablet (150 mg total) by mouth once. 1 tablet 0  . midodrine (PROAMATINE) 10 MG tablet Take 10 mg by mouth 3 (three) times daily.  5   No facility-administered medications prior to visit.     Allergies:   Vicodin; Adhesive; Cymbalta; Oxycodone; and Silicone   Social History   Social History  . Marital Status: Married    Spouse Name: N/A  . Number of Children: 2  . Years of Education: N/A   Occupational History  . Seamtress    Social History Main Topics  . Smoking status: Never Smoker   . Smokeless tobacco: Never Used  . Alcohol Use: No  . Drug Use: No  . Sexual Activity: Not Asked   Other Topics  Concern  . None   Social History Narrative     Family History:  The patient's family history includes Hypertension in her mother.     ROS:   Please see the history of present illness.    ROS All other systems reviewed and are negative.   PHYSICAL EXAM:   VS:  BP 64/42 mmHg  Pulse 81  Ht 5\' 1"  (1.549 m)  Wt 100 lb (45.36 kg)  BMI 18.90 kg/m2  SpO2 99%   GEN: Well nourished, well developed, in no acute distress HEENT: normal Neck: no JVD, carotid bruits, or masses Cardiac: RRR; no murmurs, rubs, or gallops,no edema  Respiratory:  clear to auscultation bilaterally, normal work of breathing GI: soft, nontender, nondistended, + BS MS: no deformity or atrophy Skin: warm and dry, no rash Neuro:  Alert and Oriented x 3, Strength and sensation are intact Psych: euthymic mood, full affect  Wt Readings from Last 3 Encounters:  01/22/16 100 lb (45.36 kg)  01/08/16 101 lb 12.8 oz (46.176 kg)  12/29/15 100 lb (45.36 kg)      Studies/Labs Reviewed:   EKG:  EKG is NOT ordered today.    Recent Labs: 08/05/2015: TSH 2.455 09/03/2015: Magnesium 1.9 10/13/2015: B Natriuretic Peptide 438.6* 10/21/2015: ALT 19 11/04/2015: BUN 13; Creatinine, Ser 0.53; Hemoglobin 12.4; Platelets 228; Potassium 4.4; Sodium 138   Lipid Panel    Component Value Date/Time   CHOL 181 09/04/2015 0000    Additional studies/ records that were reviewed today include:  Echo 06/04/15: - Left ventricle: The cavity size was normal. Wall thickness wasincreased in a pattern of mild LVH. Systolic function was mildly reduced. The estimated ejection fraction was in the range of 45% to 50%. Abnormal GLPSS at -11%, with inferior and inferolateral strain abnormality. Basal to mid inferolateral wall hypokinesis.Doppler parameters are consistent with restrictive left ventricular relaxation (grade 3 diastolic dysfunction). The E/A ratio is >2.5. The E/e&' ratio is >20, suggesting markedly elevated LV filling pressure. -  Mitral valve: Mildly thickened leaflets with tethering of the posterior leaflet. There is severe, posteriorly directed regurgitation which hugs the atrial free wall. Reversal of flowis seen in the pulmonary vein. - Left atrium: Severely dilated at 52 ml/m2. - Right atrium: The atrium was mildly dilated. - Tricuspid valve: There was mild regurgitation. - Pulmonary arteries: PA peak pressure: 41 mm Hg (S). - Inferior vena cava: The vessel was normal in size. The respirophasic diameter changes were in the normal range (>=  50%) consistent with normal central venous pressure. - Pericardium, extracardiac: A trivial pericardial effusion was identified posterior to the heart. Features were not consistentwith tamponade physiology. Impressions: - LVEF 45-50%, mild LVH, abnormal GLPSS at -11% with inferolateral strain and wall motion abnormalities. There is tethering of the posterior mitral leaflet and severe posteriorly directed mitral regurgitation. Restrictive diastolic dysfunction with likely elevated LV filling pressures. Severely dilated LA at 52 ml/m2,mild TR, RVSP elevated at 41 mmHg (consistent with mild pulmonary venous hypertension), trivial posterior pericardial effusion without tamponade physiology.   LHC 07/08/15:  Severe mitral regurgitation with large V waves and 4+ MR  There is mild to moderate left ventricular systolic dysfunction. With cardiac output/index moderate-severely reduced.  Angiographically normal coronary arteries  Cardiac MRI 08/14/15: IMPRESSION: 1) Mildly asymmetric septal hypertrophy 15 mm. Distal septal apical hypokinesis EF 45% 2) Diffuse delayed gadolinium uptake in the subendocardial and mid epicardial areas especially marked in the inferior wall 3) S/P mitral valve repair with no residual MR and annuloplasty ring 4) Atri-Clip of the LAA with no flow 5) No residual PDA flow 6) Mild LAE 7) Small pericardial effusion 8) Large left pleural  effusion Note delayed gadolinium findings may be consistent with myocarditis vs infiltrative cardiomyopathy  Echo 09/04/15: - Left ventricle: The cavity size was normal. There was mild concentric hypertrophy. Systolic function was moderately reduced.The estimated ejection fraction was in the range of 35% to 40%.Diffuse hypokinesis. - Aortic valve: Trileaflet; mildly thickened, mildly calcified leaflets. There was trivial regurgitation. - Mitral valve: S/P mitral valve repair with annuloplasty ring. Thefindings are consistent with mild stenosis. Valve area by continuity equation (using LVOT flow): 1.34 cm^2. - Tricuspid valve: There was mild regurgitation. - Pulmonary arteries: PA peak pressure: 39 mm Hg (S). - Pericardium, extracardiac: There was a left pleural effusion. Impressions: - The right ventricular systolic pressure was increased consistentwith mild pulmonary hypertension.  ASSESSMENT & PLAN:   Orthostatic hypotension: Diane [name] is euvolemic on exam. Orthostatic hypotension gotten worse on Northera 600 TID.She had been continued on Midodrine 10mg  TID and ivabradine 5mg  BID. Florinef previously discontinued due to volume overload. BP today 64/42. Dr. Graciela Husbands saw her with me today. Plan is to continue Midodrine 10mg  TID and Northera 100mg  TID and have her get an abdominal binder.  Recent TSH was normal by PCP at [facility]. Will check random cortisol and prolactin level to evaluate for pituitary failure. Will have her see Dr. Graciela Husbands back in 2 weeks.   Mitral regurgitation s/p mitral valve repair: warfarin discontinued and started on aspirin 81mg  daily.Stable by most recent echo  Paroxysmal atrial fibrillation: she had an episode of atrial fibrillation after being shocked for VT/VF. Diane [name] has remained in sinus rhythm since. LA appendage was clipped during mitral repair surgery. There was no flow in the LAA on cardiac MRI.Seven day event monitor did not reveal any atrial  fibrillation and coumadin was discontinued. CHADSVASC 2 (CHF, F sex).  Pleural effusion: Managed by pleurex catheter placed by Dr. Cornelius Moras.She sees Dr. Cornelius Moras back on 01/26/16. Draining about 100cc every other day.   Cardiac arrest/VT s/p ICD for secondary hypotension: Managed by Dr. Graciela Husbands  Chronic combined S/D CHF: appears euvolemic. We cannot use any BB or ACE/ARB due to ongoing issues with orthostatic hypotension. Continue ivabradine 5mg  BID. HR 100 today.   Medication Adjustments/Labs and Tests Ordered: Current medicines are reviewed at length with the patient today.  Concerns regarding medicines are outlined above.  Medication changes, Labs and Tests ordered today are  listed in the Patient Instructions below. Patient Instructions  Medication Instructions:  Your physician recommends that you continue on your current medications as directed. Please refer to the Current Medication list given to you today.]   Labwork: TODAY:  CORTISOL & PROLACTIN  Testing/Procedures: None ordered  Follow-Up: Your physician recommends that you schedule a follow-up appointment in: 2 WEEKS WITH DR. Graciela Husbands   Any Other Special Instructions Will Be Listed Below (If Applicable).     If you need a refill on your cardiac medications before your next appointment, please call your pharmacy.       Signed, Cline Crock, PA-C  01/22/2016 4:37 PM    [facility] [address] [place], [place], [place]  [address] Phone: [phone]; Fax: [phone]

## 2016-01-23 ENCOUNTER — Other Ambulatory Visit: Payer: Self-pay | Admitting: Cardiovascular Disease

## 2016-01-23 LAB — PROLACTIN: Prolactin: 6.8 ng/mL

## 2016-01-23 LAB — CORTISOL: Cortisol, Plasma: 12.3 ug/dL

## 2016-01-23 NOTE — Telephone Encounter (Signed)
Please review for refill. Thanks!  

## 2016-01-26 ENCOUNTER — Encounter: Payer: BLUE CROSS/BLUE SHIELD | Admitting: Thoracic Surgery (Cardiothoracic Vascular Surgery)

## 2016-01-28 NOTE — Telephone Encounter (Signed)
Advised patient of need for lab Stated she could go tomorrow

## 2016-01-28 NOTE — Telephone Encounter (Signed)
-----   Message from Chilton Si, MD sent at 01/27/2016  7:48 AM EDT ----- Nicole Cella,  Dr. Graciela Husbands and I have been discussing Ms. Beth and her recurrent syncope.  I talked with a colleague who runs a syncope clinic and she recommended that we check Myasthenia gravis antibodies for her.  Could you please ask her to come get that lab drawn so that hopefully the results will be available when she sees Dr. Graciela Husbands next week?  Thanks, [employer]

## 2016-01-29 ENCOUNTER — Encounter: Payer: Self-pay | Admitting: Cardiology

## 2016-01-30 LAB — FUNGUS CULTURE W SMEAR

## 2016-01-30 LAB — ACETYLCHOLINE RECEPTOR, BINDING: A CHR BINDING ABS: <0.3 nmol/L (ref <=0.30)

## 2016-02-02 LAB — STRIATED MUSCLE ANTIBODY

## 2016-02-04 ENCOUNTER — Other Ambulatory Visit: Payer: Self-pay

## 2016-02-05 ENCOUNTER — Ambulatory Visit (INDEPENDENT_AMBULATORY_CARE_PROVIDER_SITE_OTHER): Payer: BLUE CROSS/BLUE SHIELD | Admitting: Internal Medicine

## 2016-02-05 ENCOUNTER — Encounter: Payer: Self-pay | Admitting: Internal Medicine

## 2016-02-05 VITALS — BP 131/77 | HR 79 | Ht 61.0 in | Wt 101.0 lb

## 2016-02-05 DIAGNOSIS — I951 Orthostatic hypotension: Secondary | ICD-10-CM

## 2016-02-05 DIAGNOSIS — Z9889 Other specified postprocedural states: Secondary | ICD-10-CM | POA: Diagnosis not present

## 2016-02-05 DIAGNOSIS — I480 Paroxysmal atrial fibrillation: Secondary | ICD-10-CM | POA: Diagnosis not present

## 2016-02-05 DIAGNOSIS — I5042 Chronic combined systolic (congestive) and diastolic (congestive) heart failure: Secondary | ICD-10-CM

## 2016-02-05 DIAGNOSIS — Z8674 Personal history of sudden cardiac arrest: Secondary | ICD-10-CM

## 2016-02-05 DIAGNOSIS — I472 Ventricular tachycardia, unspecified: Secondary | ICD-10-CM

## 2016-02-05 MED ORDER — MIDODRINE HCL 5 MG PO TABS
5.0000 mg | ORAL_TABLET | Freq: Three times a day (TID) | ORAL | Status: DC
Start: 2016-02-05 — End: 2016-02-24

## 2016-02-05 NOTE — Progress Notes (Signed)
Patient Care Team: Dibas Koirala, MD as PCP - General (Family Medicine)   HPI  Diane [name] is a 55 y.o. female Seen in follow-up for autonomic dysfunction and severe orthostasis.  She's a history of mitral valve repair for severe MR associated with left atrial clipping and closure of the PDA. She was discharged with a unremarkable postoperative course and then had a cardiac arrest at home. She was treated with hypothermia. MRI scanning demonstrated ejection fraction 45% with diffuse delayed enhancement. An ICD was implanted for secondary prevention.  In the wake of all of this she developed severe orthostasis and recurrent syncope requiring prolonged hospitalization and almost every trig up nicely. She subsequently was discharged and recently because of worsening symptoms was started on Northera which was then uptitrated. Initially there was a significantly beneficial response and then I saw her a couple of weeks ago coming in with a blood pressure 60.  We reduced the Northera and resumed the Midrin at 10 3 times a day. Her blood pressures are now in the 130 range she's having scalp itching and a little bit of tremulousness which she ascribes to the higher dose of the ProAmatine. She's also been treated with   Ivabradine..     Past Medical History  Diagnosis Date  . Severe mitral regurgitation 07/03/2015  . Chronic combined systolic and diastolic CHF (congestive heart failure) (HCC) 07/03/2015    Grade 3 diastolic dysfunction.  LVEF 40-45% 05/2015.  Marland Kitchen Aortic aneurysm (HCC)   . Patent ductus arteriosus 07/16/2015  . Depression     SOME DEPRESSION (ON MEDS FOR 2 YRS) WHEN HER MOTHER PASSED  . 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 07/22/2015    26 mm Sorin Memo 3D ring annuloplasty with ligation of patent ductus arteriosus and clipping of LA appendage  . Cardiac arrest (HCC)   . AICD (automatic cardioverter/defibrillator) present   . Shortness of breath  dyspnea   . Anxiety   . Arthritis   . Anemia     Past Surgical History  Procedure Laterality Date  . Ovarian cyst removal    . Tee without cardioversion N/A 07/07/2015    Procedure: TRANSESOPHAGEAL ECHOCARDIOGRAM (TEE);  Surgeon: Chilton Si, MD;  Location: [facility] ENDOSCOPY;  Service: Cardiovascular;  Laterality: N/A;  . Cardiac catheterization N/A 07/08/2015    Procedure: Right/Left Heart Cath and Coronary Angiography;  Surgeon: Marykay Lex, MD;  Location: [facility] INVASIVE CV LAB;  Service: Cardiovascular;  Laterality: N/A;  . Mitral valve repair N/A 07/22/2015    Procedure: MITRAL VALVE REPAIR;  Surgeon: Purcell Nails, MD;  Location: [facility] OR;  Service: Open Heart Surgery;  Laterality: N/A;  26 Sorin Memo 3D  . Tee without cardioversion N/A 07/22/2015    Procedure: TRANSESOPHAGEAL ECHOCARDIOGRAM (TEE);  Surgeon: Purcell Nails, MD;  Location: [facility] OR;  Service: Open Heart Surgery;  Laterality: N/A;  . Patent ductus arterious repair N/A 07/22/2015    Procedure: PATENT DUCTUS ARTERIOSUS (PDA) ligation;  Surgeon: Purcell Nails, MD;  Location: [facility] OR;  Service: Open Heart Surgery;  Laterality: N/A;  . Clipping of atrial appendage  07/22/2015    Procedure: CLIPPING OF LEFT ATRIAL APPENDAGE;  Surgeon: Purcell Nails, MD;  Location: [facility] OR;  Service: Open Heart Surgery;;  35 Atriclip Pro  . Ep implantable device N/A 08/15/2015    Procedure: ICD Implant;  Surgeon: Will Jorja Loa, MD;  Location: [facility] INVASIVE CV LAB;  Service: Cardiovascular;  Laterality:  N/A;  . Colonoscopy    . Chest tube insertion Left 10/21/2015    Procedure: INSERTION left PLEURAL DRAINAGE CATHETER;  Surgeon: Purcell Nails, MD;  Location: [facility] OR;  Service: Thoracic;  Laterality: Left;  Marland Kitchen Microlaryngoscopy with co2 laser and excision of vocal cord lesion N/A 11/04/2015    Procedure: MICROLARYNGOSCOPY, RADIESSE VOCAL CORD AUGMENTATION;  Surgeon: Flo Shanks, MD;  Location: [facility] OR;  Service: ENT;  Laterality: N/A;    Current  Outpatient Prescriptions  Medication Sig Dispense Refill  . aspirin 81 MG tablet Take 81 mg by mouth daily.    . Droxidopa (NORTHERA) 100 MG CAPS Take 1 capsule by mouth 3 (three) times daily.    . ivabradine (CORLANOR) 5 MG TABS tablet Take 1 tablet (5 mg total) by mouth 2 (two) times daily with a meal. 60 tablet 6  . pregabalin (LYRICA) 25 MG capsule Take 25 mg by mouth as directed. Pt takes 25 mg bi-weekly    . midodrine (PROAMATINE) 5 MG tablet Take 1 tablet (5 mg total) by mouth 3 (three) times daily with meals. 270 tablet 3   No current facility-administered medications for this visit.    Allergies  Allergen Reactions  . Vicodin [Hydrocodone-Acetaminophen] Other (See Comments)    "she feels out of it", hallucinating  . Adhesive [Tape] Itching and Rash  . Cymbalta [Duloxetine Hcl] Nausea And Vomiting and Other (See Comments)    Excessive dizziness  . Oxycodone Nausea Only    Makes her feel strange   . Silicone Itching and Rash    TAPE ALLERGY/EKG STICKER ALLERGY, Please use "paper" tape only      Review of Systems negative except from HPI and PMH  Physical Exam BP 131/77 mmHg  Pulse 79  Ht 5\' 1"  (1.549 m)  Wt 101 lb (45.813 kg)  BMI 19.09 kg/m2  SpO2 99% Well developed and well nourished in no acute distress HENT normal E scleral and icterus clear Neck Supple JVP flat; carotids brisk and full Clear to ausculation  Regular rate and rhythm, no murmurs gallops or rub Soft with active bowel sounds No clubbing cyanosis  } Edema Alert and oriented, grossly normal motor and sensory function Skin Warm and Dry    Assessment and  Plan  Aborted cardiac arrest  Mitral valve repair/LA clipping/PDA ligation  Orthostatic intolerance/autonomic dysfunction    Overall she is doing amazingly better than she was 2 weeks ago on medical regime.  Given the tingling we will decrease her ProAmatine from 10--5  I suggested that she raises her bed 4 inches that may help with  her early a.m. symptoms

## 2016-02-05 NOTE — Patient Instructions (Signed)
Medication Instructions: - Your physician has recommended you make the following change in your medication:  1) Decrease midodrine (proamitine) to 5 mg three times a day  Labwork: - none  Procedures/Testing: -none  Follow-Up: - Your physician recommends that you schedule a follow-up appointment in: 4 weeks with Dr. Duke Salvia  Any Additional Special Instructions Will Be Listed Below (If Applicable).     If you need a refill on your cardiac medications before your next appointment, please call your pharmacy.

## 2016-02-06 ENCOUNTER — Telehealth: Payer: Self-pay | Admitting: Cardiovascular Disease

## 2016-02-06 NOTE — Telephone Encounter (Signed)
Spoke with pt pharmacy, questions regarding northera answered.

## 2016-02-06 NOTE — Telephone Encounter (Signed)
Please hit Option 3 and Option 2 [id number]- Medication in question is Northera.

## 2016-02-11 ENCOUNTER — Telehealth: Payer: Self-pay | Admitting: Cardiology

## 2016-02-11 ENCOUNTER — Ambulatory Visit (INDEPENDENT_AMBULATORY_CARE_PROVIDER_SITE_OTHER): Payer: BLUE CROSS/BLUE SHIELD | Admitting: *Deleted

## 2016-02-11 DIAGNOSIS — I472 Ventricular tachycardia, unspecified: Secondary | ICD-10-CM

## 2016-02-11 NOTE — Progress Notes (Signed)
Remote ICD transmission.   

## 2016-02-11 NOTE — Telephone Encounter (Signed)
Pt walked into the office. She stated that she was not sure how to use the home monitor. Printed off instructions for pt and gave pt my number. Pt verbalized understanding.

## 2016-02-12 ENCOUNTER — Telehealth: Payer: Self-pay | Admitting: *Deleted

## 2016-02-13 ENCOUNTER — Encounter: Payer: Self-pay | Admitting: Cardiology

## 2016-02-16 ENCOUNTER — Encounter: Payer: BLUE CROSS/BLUE SHIELD | Admitting: Thoracic Surgery (Cardiothoracic Vascular Surgery)

## 2016-02-18 LAB — CUP PACEART REMOTE DEVICE CHECK
Battery Remaining Longevity: 131 mo
Brady Statistic AP VP Percent: 0 %
Brady Statistic AP VS Percent: 0.03 %
Brady Statistic AS VP Percent: 0.03 %
Brady Statistic RV Percent Paced: 0.03 %
Date Time Interrogation Session: 20170726140620
HIGH POWER IMPEDANCE MEASURED VALUE: 56 Ohm
Implantable Lead Implant Date: 20170127
Implantable Lead Location: 753859
Lead Channel Impedance Value: 304 Ohm
Lead Channel Pacing Threshold Amplitude: 0.75 V
Lead Channel Sensing Intrinsic Amplitude: 8.375 mV
Lead Channel Sensing Intrinsic Amplitude: 8.375 mV
Lead Channel Setting Pacing Amplitude: 1.75 V
Lead Channel Setting Pacing Amplitude: 2 V
Lead Channel Setting Sensing Sensitivity: 0.3 mV
MDC IDC LEAD IMPLANT DT: 20170127
MDC IDC LEAD LOCATION: 753860
MDC IDC MSMT BATTERY VOLTAGE: 3.06 V
MDC IDC MSMT LEADCHNL RA IMPEDANCE VALUE: 399 Ohm
MDC IDC MSMT LEADCHNL RA PACING THRESHOLD AMPLITUDE: 0.875 V
MDC IDC MSMT LEADCHNL RA PACING THRESHOLD PULSEWIDTH: 0.4 ms
MDC IDC MSMT LEADCHNL RA SENSING INTR AMPL: 2.5 mV
MDC IDC MSMT LEADCHNL RA SENSING INTR AMPL: 2.5 mV
MDC IDC MSMT LEADCHNL RV IMPEDANCE VALUE: 418 Ohm
MDC IDC MSMT LEADCHNL RV PACING THRESHOLD PULSEWIDTH: 0.4 ms
MDC IDC SET LEADCHNL RV PACING PULSEWIDTH: 0.4 ms
MDC IDC STAT BRADY AS VS PERCENT: 99.94 %
MDC IDC STAT BRADY RA PERCENT PACED: 0.03 %

## 2016-02-19 ENCOUNTER — Encounter (HOSPITAL_COMMUNITY): Payer: Self-pay

## 2016-02-19 NOTE — Progress Notes (Signed)
[place] [facility] faxed medical record request on behalf of patient's ST disability claim. Requesting records 2015--present. Records from CHF clinic offive/providers 09/2015 (first encounter) -- present faxed to provided # [phone] (30 pages total). Claim # [id number] Copy of medical record request scanned into patient's electronic medical record.  Ave Filter RN

## 2016-02-24 ENCOUNTER — Encounter (HOSPITAL_COMMUNITY): Payer: Self-pay | Admitting: Internal Medicine

## 2016-02-24 ENCOUNTER — Ambulatory Visit (HOSPITAL_COMMUNITY)
Admission: RE | Admit: 2016-02-24 | Discharge: 2016-02-24 | Disposition: A | Discharge status: Home / Self Care | Payer: BLUE CROSS/BLUE SHIELD | Source: Ambulatory Visit | Attending: Internal Medicine | Admitting: Internal Medicine

## 2016-02-24 VITALS — BP 106/56 | HR 87 | Wt 100.0 lb

## 2016-02-24 DIAGNOSIS — I5042 Chronic combined systolic (congestive) and diastolic (congestive) heart failure: Secondary | ICD-10-CM | POA: Diagnosis not present

## 2016-02-24 DIAGNOSIS — I951 Orthostatic hypotension: Secondary | ICD-10-CM | POA: Insufficient documentation

## 2016-02-24 DIAGNOSIS — I5043 Acute on chronic combined systolic (congestive) and diastolic (congestive) heart failure: Secondary | ICD-10-CM | POA: Diagnosis present

## 2016-02-24 DIAGNOSIS — I9589 Other hypotension: Secondary | ICD-10-CM | POA: Diagnosis not present

## 2016-02-24 DIAGNOSIS — Z95810 Presence of automatic (implantable) cardiac defibrillator: Secondary | ICD-10-CM | POA: Diagnosis not present

## 2016-02-24 DIAGNOSIS — Z7982 Long term (current) use of aspirin: Secondary | ICD-10-CM | POA: Diagnosis not present

## 2016-02-24 DIAGNOSIS — Z7901 Long term (current) use of anticoagulants: Secondary | ICD-10-CM | POA: Insufficient documentation

## 2016-02-24 DIAGNOSIS — Z9889 Other specified postprocedural states: Secondary | ICD-10-CM

## 2016-02-24 DIAGNOSIS — Z8674 Personal history of sudden cardiac arrest: Secondary | ICD-10-CM | POA: Diagnosis not present

## 2016-02-24 DIAGNOSIS — I480 Paroxysmal atrial fibrillation: Secondary | ICD-10-CM | POA: Insufficient documentation

## 2016-02-24 DIAGNOSIS — Z736 Limitation of activities due to disability: Secondary | ICD-10-CM

## 2016-02-24 DIAGNOSIS — Z888 Allergy status to other drugs, medicaments and biological substances status: Secondary | ICD-10-CM | POA: Diagnosis not present

## 2016-02-24 DIAGNOSIS — Z8249 Family history of ischemic heart disease and other diseases of the circulatory system: Secondary | ICD-10-CM | POA: Diagnosis not present

## 2016-02-24 DIAGNOSIS — Z79899 Other long term (current) drug therapy: Secondary | ICD-10-CM | POA: Diagnosis not present

## 2016-02-24 DIAGNOSIS — Z885 Allergy status to narcotic agent status: Secondary | ICD-10-CM | POA: Diagnosis not present

## 2016-02-24 MED ORDER — MIDODRINE HCL 10 MG PO TABS: 10.0000 mg | ORAL_TABLET | Freq: Three times a day (TID) | ORAL | 3 refills | Status: DC

## 2016-02-24 MED ORDER — IVABRADINE HCL 5 MG PO TABS: 7.5000 mg | ORAL_TABLET | Freq: Two times a day (BID) | ORAL | 6 refills | Status: DC

## 2016-02-24 NOTE — Patient Instructions (Signed)
Increase Midodrine to 10 mg Three times a day   Increase Corlanor to 7.5 mg (1 & 1/2 tabs) Twice daily   We will contact you in 4 months to schedule your next appointment.

## 2016-02-24 NOTE — Addendum Note (Signed)
Encounter addended by: Noralee Space, RN on: 02/24/2016 11:19 AM<BR>    Actions taken: Order Entry activity accessed, Sign clinical note

## 2016-02-24 NOTE — Progress Notes (Signed)
[name], female   DOB: February 02, 1961, 55 y.o.   MRN: [id number]   [facility]IN[facility] NOTE   Date:  02/24/2016   ID:  Emerey Yueh, DOB May 27, 1961, MRN [id number]  PCP:  Darrow Bussing, MD  Cardiologist:   Dr. Duke Salvia Electrophysiologist: Dr. Berton Mount   [name] is a 55 y.o. female who is referred to the HF Clinic by Dr. Duke Salvia for further evaluation of her chronic systolic and diastolic heart failure and possible infiltrative CM.  Says her symptoms started about 2 years ago with syncope and DOE. Finally referred to Dr. Duke Salvia in 12/16. Echocardiography revealed severe mitral regurgitation and a reduced LVEF (45-50%).  The MR was due to tethering of the posterior leaflet.  She was also noted to have grade 3 diastolic dysfunction with mid basal to mid inferolateral wall hypokinesis.  On 07/22/15 underwent repair of her mitral valve with a 26 mm Sorin Memo 3D annuloplasty ring on 07/22/15 by Dr. Cornelius Moras.  Intra-operatively she was noted to have a PDA with left to right shunting that was ligated in the OR. The left atrium was also clipped.   Her post operative course was initially unremarkable and she was discharged on post-op day 5.    On 08/05/15 she suffered a cardiac arrest at home and was resuscitated by EMS.  The initial rhythm was unclear but she had VT upon arrival in the ED, which was followed by atrial fibrillation.  She was treated with hypothermia and had prolonged hospital course. Fortunately she has no residual cognitive deficits.  During that hospitalization she underwent cardiac MRI with LVEF 45% with hypokinesis of the distal septum and apex.  There was diffuse, delayed enhancement in the subendocardium and mid epicardial region, most prominent in the inferior wall.  This was concerning for myocarditis or infiltrative cardiomyopathy.  Her EKG showed QT prolongation and she had an ICD placed for secondary prevention.  Her hospitalization was delayed by  severe orthostatic hypotension and recurrent syncope requiring high doses of florinef and midodrine which were weaned down.  She was discharged home but was readmitted with volume overload on 2/16.  Echo showed LVEF 35% with diffuse hypokinesis and her mitral valve was functioning well.  She also underwent L thoracentesis with removal of 1.4L and received IV lasix.  She was discharged home on 2/16 and followed up in clinic on 2/23.  At that time she reported some dizziness and was hypotensive to 84/58.   She underwent Pleurex catheter placement for recurrent left pleural effusion on 10/20/16.   She returns for f/u. Continues to feel very weak. Draining pleurex 3x/week. About 150-200cc at a time. Still struggling with low BP. BP actually stabilized for a little while. On 7/20 Dr. Graciela Husbands cut midodrine down to 5 TID due to side effects of head tingling. However over past 2 weeks BP has been dropping into 70s. Had syncopal episode last night walking back from bathroom. No edema, orthopnea or PND. Taking lasix only as needed. Exercising at times but feels wiped out.      Past Medical History:  Diagnosis Date  . AICD (automatic cardioverter/defibrillator) present   . Anemia   . Anxiety   . Aortic aneurysm (HCC)   . Arthritis   . Cardiac arrest (HCC)   . Chronic combined systolic and diastolic CHF (congestive heart failure) (HCC) 07/03/2015   Grade 3 diastolic dysfunction.  LVEF 40-45% 05/2015.  Marland Kitchen Depression    SOME DEPRESSION (ON MEDS FOR 2  YRS) WHEN HER MOTHER PASSED  . Patent ductus arteriosus 07/16/2015  . 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 07/22/2015   26 mm Sorin Memo 3D ring annuloplasty with ligation of patent ductus arteriosus and clipping of LA appendage  . Severe mitral regurgitation 07/03/2015  . Shortness of breath dyspnea     Past Surgical History:  Procedure Laterality Date  . CARDIAC CATHETERIZATION N/A 07/08/2015   Procedure: Right/Left Heart Cath and  Coronary Angiography;  Surgeon: Marykay Lex, MD;  Location: [facility] INVASIVE CV LAB;  Service: Cardiovascular;  Laterality: N/A;  . CHEST TUBE INSERTION Left 10/21/2015   Procedure: INSERTION left PLEURAL DRAINAGE CATHETER;  Surgeon: Purcell Nails, MD;  Location: [facility] OR;  Service: Thoracic;  Laterality: Left;  . CLIPPING OF ATRIAL APPENDAGE  07/22/2015   Procedure: CLIPPING OF LEFT ATRIAL APPENDAGE;  Surgeon: Purcell Nails, MD;  Location: [facility] OR;  Service: Open Heart Surgery;;  [address]  . COLONOSCOPY    . EP IMPLANTABLE DEVICE N/A 08/15/2015   Procedure: ICD Implant;  Surgeon: Will Jorja Loa, MD;  Location: [facility] INVASIVE CV LAB;  Service: Cardiovascular;  Laterality: N/A;  . MICROLARYNGOSCOPY WITH CO2 LASER AND EXCISION OF VOCAL CORD LESION N/A 11/04/2015   Procedure: MICROLARYNGOSCOPY, RADIESSE VOCAL CORD AUGMENTATION;  Surgeon: Flo Shanks, MD;  Location[facility] OR;  Service: ENT;  Laterality: N/A;  . MITRAL VALVE REPAIR N/A 07/22/2015   Procedure: MITRAL VALVE REPAIR;  Surgeon: Purcell Nails, MD;  Location: [facility] OR;  Service: Open Heart Surgery;  Laterality: N/A;  26 Sorin Memo 3D  . OVARIAN CYST REMOVAL    . PATENT DUCTUS ARTERIOUS REPAIR N/A 07/22/2015   Procedure: PATENT DUCTUS ARTERIOSUS (PDA) ligation;  Surgeon: Purcell Nails, MD;  Location: [facility] OR;  Service: Open Heart Surgery;  Laterality: N/A;  . TEE WITHOUT CARDIOVERSION N/A 07/07/2015   Procedure: TRANSESOPHAGEAL ECHOCARDIOGRAM (TEE);  Surgeon: Chilton Si, MD;  Location: [facility] ENDOSCOPY;  Service: Cardiovascular;  Laterality: N/A;  . TEE WITHOUT CARDIOVERSION N/A 07/22/2015   Procedure: TRANSESOPHAGEAL ECHOCARDIOGRAM (TEE);  Surgeon: Purcell Nails, MD;  Location: [facility] OR;  Service: Open Heart Surgery;  Laterality: N/A;     Current Outpatient Prescriptions  Medication Sig Dispense Refill  . aspirin 81 MG tablet Take 81 mg by mouth daily.    . Droxidopa (NORTHERA) 100 MG CAPS Take 1 capsule by mouth 3 (three) times daily.    .  ivabradine (CORLANOR) 5 MG TABS tablet Take 1 tablet (5 mg total) by mouth 2 (two) times daily with a meal. 60 tablet 6  . midodrine (PROAMATINE) 5 MG tablet Take 1 tablet (5 mg total) by mouth 3 (three) times daily with meals. 270 tablet 3  . pregabalin (LYRICA) 25 MG capsule Take 25 mg by mouth as directed. Pt takes 25 mg bi-weekly     No current facility-administered medications for this encounter.     Allergies:   Vicodin [hydrocodone-acetaminophen]; Adhesive [tape]; Cymbalta [duloxetine hcl]; Oxycodone; and Silicone    Social History:  The patient  reports that she has never smoked. She has never used smokeless tobacco. She reports that she does not drink alcohol or use drugs.   Family History:  The patient's family history includes Hypertension in her mother.    ROS:  Please see the history of present illness.  Otherwise, review of systems are positive for none.   All other systems are reviewed and negative.    PHYSICAL EXAM: VS:  BP (!) 106/56   Pulse 87   Wt 100 lb (45.4 kg)   SpO2 100%   BMI 18.89 kg/m  , BMI Body mass index is 18.89 kg/m. GENERAL:  Weak appearing   HEENT:  Pupils equal round and reactive, fundi not visualized, oral mucosa unremarkable.  Hoarse voice NECK:  JVP 5-6 LYMPHATICS:  No cervical adenopathy LUNGS:  Clear.  Pleurex cathsite ok  HEART:  RRR.  PMI not displaced or sustained,S1 and S2 within normal limits, no S3, no S4, no clicks, no rubs, no murmurs ABD:  Flat, positive bowel sounds normal in frequency in pitch, no bruits, no rebound, no guarding, no midline pulsatile mass, no hepatomegaly, no splenomegaly EXT:  2 plus pulses throughout, trace edema, no cyanosis no clubbing SKIN:  No rashes no nodules NEURO:  Cranial nerves II through XII grossly intact, motor grossly intact throughout [facility]:  Cognitively intact, oriented to person place and time   PFT 11/05/14:  FEV1  1.9 L FVC : 2.4 L FEV1 and FVC are reduced, but the FEV1/FVC ratio is  normal.  Following adminitration of bronchodilator there was no significant response.  Consistent with minimal obstructive airway disease.    LHC 07/08/15:  Severe mitral regurgitation with large V waves and 4+ MR  There is mild to moderate left ventricular systolic dysfunction. With cardiac output/index moderate-severely reduced.  Angiographically normal coronary arteries   Recent Labs: 08/05/2015: TSH 2.455 09/03/2015: Magnesium 1.9 10/13/2015: B Natriuretic Peptide 438.6 10/21/2015: ALT 19 11/04/2015: BUN 13; Creatinine, Ser 0.53; Hemoglobin 12.4; Platelets 228; Potassium 4.4; Sodium 138    Lipid Panel    Component Value Date/Time   CHOL 181 09/04/2015 0000      Wt Readings from Last 3 Encounters:  02/24/16 100 lb (45.4 kg)  02/05/16 101 lb (45.8 kg)  01/22/16 100 lb (45.4 kg)      ASSESSMENT AND PLAN:  1) Acute on  Chronic systolic and diastolic heart failure: --EF 35-40% on echo 2/17; 40% on cMRI. S/p ICD for SCD. NYHA III --Volume status ok off lasix. Take as needed to maintain weight 105 --MRI reviewed and is suggestive of possible infiltrative process or myocarditis. I actually favor myocarditis. SPEP/UPEP negative as well as serologies for CTD. Unfortunately cannot repeat MRI due to ICD. Eventually may need to consider EM biopsy but I do not think this is emergent. And she doe not want to pursue at this time.  --She continues to require midodrine and Northera for BP support, so no beta blocker or ACE-I/ARB at this time. t --Increase corlanor 7.5  bid  2) Recurrent left pleural effusion -- Now s/p pleurex catheter. Continues to drain. Followed by Dr. Cornelius Moras and [facility].   3) Mitral regurgitation s/p mitral valve repair:  Stable s/p MV repair  4) Paroxysmal atrial fibrillation: Remains in sinus rhythm.  LA appendage was clipped in surgery.  The patients CHA2DS2-VASc Score and unadjusted Ischemic Stroke Rate (% per year) is equal to 2.2 % stroke rate/year from a score of 2 .  Continue warfarin.   5) Orthostatic hypotension: BP back down. Will increase midodrine to 10 tid. Continue Northera. May be worth a visit to tertiary center for second opinion.    Migdalia Dk, MD  02/24/2016 10:59 AM

## 2016-02-26 ENCOUNTER — Other Ambulatory Visit: Payer: Self-pay | Admitting: Thoracic Surgery (Cardiothoracic Vascular Surgery)

## 2016-02-26 DIAGNOSIS — Z9889 Other specified postprocedural states: Secondary | ICD-10-CM

## 2016-03-01 ENCOUNTER — Encounter: Payer: Self-pay | Admitting: Thoracic Surgery (Cardiothoracic Vascular Surgery)

## 2016-03-01 ENCOUNTER — Ambulatory Visit (INDEPENDENT_AMBULATORY_CARE_PROVIDER_SITE_OTHER): Payer: BLUE CROSS/BLUE SHIELD | Admitting: Thoracic Surgery (Cardiothoracic Vascular Surgery)

## 2016-03-01 ENCOUNTER — Ambulatory Visit
Admission: RE | Admit: 2016-03-01 | Discharge: 2016-03-01 | Disposition: A | Discharge status: Home / Self Care | Payer: BLUE CROSS/BLUE SHIELD | Source: Ambulatory Visit | Attending: Thoracic Surgery (Cardiothoracic Vascular Surgery) | Admitting: Thoracic Surgery (Cardiothoracic Vascular Surgery)

## 2016-03-01 VITALS — BP 125/69 | HR 80 | Resp 20 | Ht 61.0 in | Wt 100.0 lb

## 2016-03-01 DIAGNOSIS — I5042 Chronic combined systolic (congestive) and diastolic (congestive) heart failure: Secondary | ICD-10-CM

## 2016-03-01 DIAGNOSIS — Z8774 Personal history of (corrected) congenital malformations of heart and circulatory system: Secondary | ICD-10-CM

## 2016-03-01 DIAGNOSIS — Z9889 Other specified postprocedural states: Secondary | ICD-10-CM | POA: Diagnosis not present

## 2016-03-01 DIAGNOSIS — J90 Pleural effusion, not elsewhere classified: Secondary | ICD-10-CM | POA: Diagnosis not present

## 2016-03-01 DIAGNOSIS — I340 Nonrheumatic mitral (valve) insufficiency: Secondary | ICD-10-CM | POA: Diagnosis not present

## 2016-03-01 NOTE — Patient Instructions (Signed)
Continue draining PleurX catheter at least 3 times per week  Continue all previous medications without any changes at this time

## 2016-03-01 NOTE — Progress Notes (Signed)
[address]       [place] [id number]             [phone]     CARDIOTHORACIC SURGERY OFFICE NOTE  Referring Provider is Arvilla MeresBensimhon, Daniel, MD Primary Cardiologist is Marykay LexHarding, David W, MD PCP is Darrow BussingKOIRALA,DIBAS, MD   HPI:  Patient returns to the office today for follow-up of left Pleurx catheter placement for recurrent left pleural effusion.  She was last seen here in our office on 12/29/2015. At that time she was having problems with a skin rash around her Pleurx catheter that appeared to be related to the surrounding dressing. The dressings were discontinued and the patient was given instructions regarding skincare. The rash resolved fairly quickly. Since then the Pleurx catheter continues to drain although the patient states that the volume has slowly decreased over time. She states that for the last several weeks the catheter typically drains between 100 and 150 mL of thin serous fluid when it is drained 3 times per week.  She has not had problems with shortness of breath. She continues to have problems with dizzy spells and she states that she briefly passed out when she got up to go to the bathroom early yesterday morning. She has been followed carefully by Dr. Graciela HusbandsKlein and Dr. Gala RomneyBensimhon. She was seen most recently by Dr. Gala RomneyBensimhon on 02/24/2016. Her dose of Corlanor was increased at that time.  She has been off of Lasix for several weeks and not had problems with volume retention. Her weight has been stable.   Current Outpatient Prescriptions  Medication Sig Dispense Refill  . aspirin 81 MG tablet Take 81 mg by mouth daily.    . Droxidopa (NORTHERA) 100 MG CAPS Take 1 capsule by mouth 3 (three) times daily.    . ivabradine (CORLANOR) 5 MG TABS tablet Take 1.5 tablets (7.5 mg total) by mouth 2 (two) times daily with a meal. 90 tablet 6  . midodrine (PROAMATINE) 10 MG tablet Take 1 tablet (10 mg total) by mouth 3 (three) times daily with meals. 90 tablet 3  .  pregabalin (LYRICA) 25 MG capsule Take 25 mg by mouth as directed. Pt takes 25 mg bi-weekly     No current facility-administered medications for this visit.       Physical Exam:   BP 125/69 (BP Location: Right Arm, Patient Position: Sitting, Cuff Size: Small)   Pulse 80   Resp 20   Ht 5\' 1"  (1.549 m)   Wt 100 lb (45.4 kg)   SpO2 99% Comment: RA  BMI 18.89 kg/m   General:  Chronically ill-appearing and weak  Chest:   Clear to auscultation  CV:   Regular rate and rhythm  Incisions:  Pleurx catheter exit site is clean and dry  Abdomen:  Soft and nontender  Extremities:  Warm and well-perfused, no edema  Diagnostic Tests:  CHEST  2 VIEW  COMPARISON:  PA and lateral chest x-ray of December 29, 2015.  FINDINGS: The lungs are well-expanded and clear. There is no infiltrate or pleural effusion. The heart and pulmonary vascularity are normal. The mediastinum is normal in width. The left atrial appendage clip and the mitral valve ring appears stable. The position of the ICD is stable. The sternal wires are intact. There is a tubular structure that projects over the left lower hemi thorax that is unchanged and may reflect a mediastinal drain.  IMPRESSION: There is no acute cardiopulmonary abnormality. Stable implanted devices as  described.   Electronically Signed   By: David  [place] M.D.   On: September 26, 202017 15:13   Impression:  Patient's Pleurx catheter continues to drain a small volume of thin serous fluid, typically averaging between 100 and 150 mL when it is drained, currently on a Monday Wednesday Friday schedule.  She has no clinical signs of fluid overload and her weight has been stable off of Lasix. Chest x-ray is clear with no sign of any significant pleural effusion.  Plan:  Continue draining Pleurx catheter until output decreases further. Patient will return in 2 months or sooner if her catheter stops draining.  I spent in excess of 15 minutes during the conduct  of this office consultation and >50% of this time involved direct face-to-face encounter with the patient for counseling and/or coordination of their care.    Salvatore Decent. Cornelius Moras, MD 2020-06-1616 3:40 PM

## 2016-03-02 ENCOUNTER — Encounter: Payer: Self-pay | Admitting: Cardiology

## 2016-03-02 ENCOUNTER — Other Ambulatory Visit: Payer: Self-pay | Admitting: *Deleted

## 2016-03-02 ENCOUNTER — Other Ambulatory Visit: Payer: Self-pay

## 2016-03-02 MED ORDER — DROXIDOPA 100 MG PO CAPS: 1.0000 | ORAL_CAPSULE | Freq: Three times a day (TID) | ORAL | 1 refills | Status: DC

## 2016-03-02 NOTE — Progress Notes (Signed)
Letter  

## 2016-03-02 NOTE — Telephone Encounter (Signed)
Pharmacy called about medication refill.

## 2016-03-03 DIAGNOSIS — J90 Pleural effusion, not elsewhere classified: Secondary | ICD-10-CM | POA: Diagnosis not present

## 2016-03-05 ENCOUNTER — Telehealth (HOSPITAL_COMMUNITY): Payer: Self-pay

## 2016-03-05 MED ORDER — IVABRADINE HCL 5 MG PO TABS: 5.0000 mg | ORAL_TABLET | Freq: Two times a day (BID) | ORAL | 6 refills | Status: DC

## 2016-03-05 NOTE — Telephone Encounter (Signed)
Patient called CHF clinic triage line to report since increasing Corlanor to 7.5 mg twice daily she has felt extremely dizzy and fatigued.  Patient self-reduced dose back to 5 mg twice daily and states she feels much better and would like to continue on this dose. Will forward to Dr. Gala Romney who recent saw patient and made medication adjustment to make him aware. Will update med rec to 5mg  twice daily dose for now. No other questions/concerns/needs by the patient at this time.  Ave Filter, RN

## 2016-03-09 ENCOUNTER — Ambulatory Visit (INDEPENDENT_AMBULATORY_CARE_PROVIDER_SITE_OTHER): Payer: BLUE CROSS/BLUE SHIELD | Admitting: Cardiovascular Disease

## 2016-03-09 ENCOUNTER — Encounter: Payer: Self-pay | Admitting: Cardiovascular Disease

## 2016-03-09 VITALS — BP 110/63 | Ht 61.0 in | Wt 99.0 lb

## 2016-03-09 DIAGNOSIS — M79601 Pain in right arm: Secondary | ICD-10-CM | POA: Diagnosis not present

## 2016-03-09 DIAGNOSIS — R202 Paresthesia of skin: Secondary | ICD-10-CM | POA: Diagnosis not present

## 2016-03-09 DIAGNOSIS — Z9889 Other specified postprocedural states: Secondary | ICD-10-CM | POA: Diagnosis not present

## 2016-03-09 DIAGNOSIS — I5042 Chronic combined systolic (congestive) and diastolic (congestive) heart failure: Secondary | ICD-10-CM | POA: Diagnosis not present

## 2016-03-09 DIAGNOSIS — G909 Disorder of the autonomic nervous system, unspecified: Secondary | ICD-10-CM

## 2016-03-09 DIAGNOSIS — M79602 Pain in left arm: Secondary | ICD-10-CM

## 2016-03-09 DIAGNOSIS — I951 Orthostatic hypotension: Secondary | ICD-10-CM

## 2016-03-09 DIAGNOSIS — M79603 Pain in arm, unspecified: Secondary | ICD-10-CM

## 2016-03-09 NOTE — Progress Notes (Signed)
Cardiology Office Note   Date:  03/09/2016   ID:  Diane [name], DOB 1961-05-01, MRN [id number]  PCP:  Darrow Bussing, MD  Cardiologist:   Chilton Si, MD  Electrophysiologist: Dr. Berton Mount  Chief Complaint  Patient presents with  . Follow-up    sob; frequently lightheaded; mornings/nights. Pt states she fainted last friday.     [name] is a 55 y.o. female who presents for follow up on mitral valve repair and chronic systolic and diastolic heart failure.   History of Present Illness Diane [name] initially presented 05/2015 with recurrent syncope and progressive shortness of breath.  She previously had an exercise Myoview 07/2013 that was negative for ischemia.  She underwent echocardiography 05/2015 that revealed severe mitral regurgitation and a reduced LVEF (45-50%). The MR was due to tethering of the posterior leaflet. She was also noted to have grade 3 diastolic dysfunction with mid basal to mid inferolateral wall hypokinesis.  Diane [name] underwent repair of her mitral valve with a 26 mm Sorin Memo 3D annuloplasty ring on 07/22/15. Intra-operatively she was noted to have a PDA with left to right shunting that was ligated in the OR. The left atrium was also clipped. Her post operative course was initially unremarkable and she was discharged on post-op day 5. On 1/17 she suffered a cardiac arrest at home and was resuscitated by EMS. The initial rhythm was unclear but she had VT upon arrival in the ED, which was followed by atrial fibrillation. She was treated with hypothermia and has no residual cognitive deficits. During that hospitalization she underwent cardiac MRI with LVEF 45% with hypokinesis of the distal septum and apex. There was diffuse, delayed enhancement in the subendocardium and mid epicardial region, most prominent in the inferior wall. This was concerning for myocarditis or infiltrative cardiomyopathy. Her EKG showed QT prolongation and  she had an ICD placed for secondary prevention. Her hospitalization was delayed by severe orthostatic hypotension and recurrent syncope requiring high doses of florinef and midodrine. She was discharged home but was readmitted with volume overload on 2/16. Echo showed LVEF 35-40% with diffuse hypokinesis and her mitral valve was functioning well.  Diane [name] wore an event monitor on 12/22/15 that did not reveal any abnormalities.  Given that her only episode of atrial fibrillation occurred perioperatively and her left atrial appendage has been clipped, warfarin was discontinued.  Since her initial hospitalization she has struggled with hypotension and recurrent L pleural effusion.  She had a pleurex catheter placed in the L chest.  She was started on ivabradine and the dose was increased to 7.5 mg on 02/24/16.  She continued to have dizziness and orthostatic hypotension so she was started on Northera.  Florinef was previously discontinued due to edema. She was doing well on Northera and midodrine but had syncope when midodrine was stopped.  It was added back at 5 mg and she continued to have syncope twice last week so it was increased back to 10 mg when she saw Dr. Gala Romney on 8/8.  Since then she has been feeling better and denies any recurrent syncope or lightheadedness. She continues to drain 100-110 mL from her Pleurx catheter 3 times per week. Her main concern now is gaining her strength back.  She notes that her breathing has been better and she denies lower extremity edema, orthopnea, or PND. She continues to have discomfort at the Pleurx catheter site. She had cortisol levels repeated 01/22/16, which were normal.  She also had  testing for acetylcholine receptor antibodies and striated muscle antibodies which were both unremarkable.  Diane [name] also notes the bilateral arms hurt constantly. This is been going on since her surgery. She also feels as though both hands are cold, though they're not cold to  the touch when other structure. She has to sleep with socks over her hand.  Past Medical History:  Diagnosis Date  . AICD (automatic cardioverter/defibrillator) present   . Anemia   . Anxiety   . Aortic aneurysm (HCC)   . Arthritis   . Cardiac arrest (HCC)   . Chronic combined systolic and diastolic CHF (congestive heart failure) (HCC) 07/03/2015   Grade 3 diastolic dysfunction.  LVEF 40-45% 05/2015.  Marland Kitchen Depression    SOME DEPRESSION (ON MEDS FOR 2 YRS) WHEN HER MOTHER PASSED  . Patent ductus arteriosus 07/16/2015  . 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 07/22/2015   26 mm Sorin Memo 3D ring annuloplasty with ligation of patent ductus arteriosus and clipping of LA appendage  . Severe mitral regurgitation 07/03/2015  . Shortness of breath dyspnea     Past Surgical History:  Procedure Laterality Date  . CARDIAC CATHETERIZATION N/A 07/08/2015   Procedure: Right/Left Heart Cath and Coronary Angiography;  Surgeon: Marykay Lex, MD;  Location: [facility] INVASIVE CV LAB;  Service: Cardiovascular;  Laterality: N/A;  . CHEST TUBE INSERTION Left 10/21/2015   Procedure: INSERTION left PLEURAL DRAINAGE CATHETER;  Surgeon: Purcell Nails, MD;  Location: [facility] OR;  Service: Thoracic;  Laterality: Left;  . CLIPPING OF ATRIAL APPENDAGE  07/22/2015   Procedure: CLIPPING OF LEFT ATRIAL APPENDAGE;  Surgeon: Purcell Nails, MD;  Location: [facility] OR;  Service: Open Heart Surgery;;  [address]  . COLONOSCOPY    . EP IMPLANTABLE DEVICE N/A 08/15/2015   Procedure: ICD Implant;  Surgeon: Will Jorja Loa, MD;  Location: [facility] INVASIVE CV LAB;  Service: Cardiovascular;  Laterality: N/A;  . MICROLARYNGOSCOPY WITH CO2 LASER AND EXCISION OF VOCAL CORD LESION N/A 11/04/2015   Procedure: MICROLARYNGOSCOPY, RADIESSE VOCAL CORD AUGMENTATION;  Surgeon: Flo Shanks, MD;  Location: [facility] OR;  Service: ENT;  Laterality: N/A;  . MITRAL VALVE REPAIR N/A 07/22/2015   Procedure: MITRAL VALVE REPAIR;  Surgeon:  Purcell Nails, MD;  Location: [facility] OR;  Service: Open Heart Surgery;  Laterality: N/A;  26 Sorin Memo 3D  . OVARIAN CYST REMOVAL    . PATENT DUCTUS ARTERIOUS REPAIR N/A 07/22/2015   Procedure: PATENT DUCTUS ARTERIOSUS (PDA) ligation;  Surgeon: Purcell Nails, MD;  Location: [facility] OR;  Service: Open Heart Surgery;  Laterality: N/A;  . TEE WITHOUT CARDIOVERSION N/A 07/07/2015   Procedure: TRANSESOPHAGEAL ECHOCARDIOGRAM (TEE);  Surgeon: Chilton Si, MD;  Location[facility] ENDOSCOPY;  Service: Cardiovascular;  Laterality: N/A;  . TEE WITHOUT CARDIOVERSION N/A 07/22/2015   Procedure: TRANSESOPHAGEAL ECHOCARDIOGRAM (TEE);  Surgeon: Purcell Nails, MD;  Location: [facility] OR;  Service: Open Heart Surgery;  Laterality: N/A;     Current Outpatient Prescriptions  Medication Sig Dispense Refill  . aspirin 81 MG tablet Take 81 mg by mouth daily.    . Droxidopa (NORTHERA) 100 MG CAPS Take 1 capsule by mouth 3 (three) times daily. 90 capsule 1  . ivabradine (CORLANOR) 5 MG TABS tablet Take 1 tablet (5 mg total) by mouth 2 (two) times daily with a meal. 60 tablet 6  . midodrine (PROAMATINE) 10 MG tablet Take 1 tablet (10 mg total) by mouth 3 (three) times daily with meals.  90 tablet 3  . pregabalin (LYRICA) 25 MG capsule Take 25 mg by mouth as directed. Pt takes 25 mg bi-weekly     No current facility-administered medications for this visit.     Allergies:   Vicodin [hydrocodone-acetaminophen]; Adhesive [tape]; Cymbalta [duloxetine hcl]; Oxycodone; and Silicone    Social History:  The patient  reports that she has never smoked. She has never used smokeless tobacco. She reports that she does not drink alcohol or use drugs.   Family History:  The patient's family history includes Hypertension in her mother.    ROS:  Please see the history of present illness.  Otherwise, review of systems are positive for bilateral arm pain and cold hands.   All other systems are reviewed and negative.    PHYSICAL EXAM: VS:  BP  110/63   Ht 5\' 1"  (1.549 m)   Wt 99 lb (44.9 kg)   BMI 18.71 kg/m  , BMI Body mass index is 18.71 kg/m. GENERAL:  Chronically ill-appearing.  Appears mildly ill and weak.  Voice is stronger. HEENT:  Pupils equal round and reactive, fundi not visualized, oral mucosa unremarkable.  Hoarse voice NECK:  No jugular venous distention, waveform within normal limits, carotid upstroke brisk and symmetric, no bruits\ LYMPHATICS:  No cervical adenopathy LUNGS:  Clear to auscultation bilaterally. HEART:  RRR.  PMI not displaced or sustained,S1 and S2 within normal limits, no S3, no S4, no clicks, no rubs, no murmurs Chest: L pleurex catheter.   ABD:  Flat, positive bowel sounds normal in frequency in pitch, no bruits, no rebound, no guarding, no midline pulsatile mass, no hepatomegaly, no splenomegaly EXT:  2 plus pulses throughout, no edema, no cyanosis no clubbing SKIN:  No rashes no nodules NEURO:  Cranial nerves II through XII grossly intact, motor grossly intact throughout PSYCH:  Cognitively intact, oriented to person place and time  EKG:  EKG is not ordered today.   12/06/15: Sinus rhythm. Rate 78 bpm. Left bundle-branch block.  Left ventricular hypertrophy. Left anterior fascicular block.   Echo 06/04/15: Study Conclusions  - Left ventricle: The cavity size was normal. Wall thickness was increased in a pattern of mild LVH. Systolic function was mildly reduced. The estimated ejection fraction was in the range of 45% to 50%. Abnormal GLPSS at -11%, with inferior and inferolateral strain abnormality. Basal to mid inferolateral wall hypokinesis. Doppler parameters are consistent with restrictive left ventricular relaxation (grade 3 diastolic dysfunction). The E/A ratio is >2.5. The E/e&' ratio is >20, suggesting markedly elevated LV filling pressure. - Mitral valve: Mildly thickened leaflets with tethering of the posterior leaflet. There is severe, posteriorly  directed regurgitation which hugs the atrial free wall. Reversal of flow is seen in the pulmonary vein. - Left atrium: Severely dilated at 52 ml/m2. - Right atrium: The atrium was mildly dilated. - Tricuspid valve: There was mild regurgitation. - Pulmonary arteries: PA peak pressure: 41 mm Hg (S). - Inferior vena cava: The vessel was normal in size. The respirophasic diameter changes were in the normal range (>= 50%), consistent with normal central venous pressure. - Pericardium, extracardiac: A trivial pericardial effusion was identified posterior to the heart. Features were not consistent with tamponade physiology.  Impressions:  - LVEF 45-50%, mild LVH, abnormal GLPSS at -11% with inferolateral strain and wall motion abnormalities. There is tethering of the posterior mitral leaflet and severe posteriorly directed mitral regurgitation. Restrictive diastolic dysfunction with likely elevated LV filling pressures. Severely dilated LA at 52 ml/m2, mild  TR, RVSP elevated at 41 mmHg (consistent with mild pulmonary venous hypertension), trivial posterior pericardial effusion without tamponade physiology.  PFT 11/05/14:  FEV1  1.9 L FVC : 2.4 L FEV1 and FVC are reduced, but the FEV1/FVC ratio is normal.  Following adminitration of bronchodilator there was no significant response.  Consistent with minimal obstructive airway disease.    LHC 07/08/15:  Severe mitral regurgitation with large V waves and 4+ MR  There is mild to moderate left ventricular systolic dysfunction. With cardiac output/index moderate-severely reduced.  Angiographically normal coronary arteries  Cardiac MRI 08/14/15: IMPRESSION: 1) Mildly asymmetric septal hypertrophy 15 mm. Distal septal apical hypokinesis EF 45%  2) Diffuse delayed gadolinium uptake in the subendocardial and mid epicardial areas especially marked in the inferior wall  3) S/P mitral valve repair with no residual  MR and annuloplasty ring  4) Atri-Clip of the LAA with no flow  5) No residual PDA flow  6) Mild LAE  7) Small pericardial effusion  8) Large left pleural effusion  Note delayed gadolinium findings may be consistent with myocarditis vs infiltrative cardiomyopathy  Echo 09/04/15: Study Conclusions  - Left ventricle: The cavity size was normal. There was mild concentric hypertrophy. Systolic function was moderately reduced. The estimated ejection fraction was in the range of 35% to 40%. Diffuse hypokinesis. - Aortic valve: Trileaflet; mildly thickened, mildly calcified leaflets. There was trivial regurgitation. - Mitral valve: S/P mitral valve repair with annuloplasty ring. The findings are consistent with mild stenosis. Valve area by continuity equation (using LVOT flow): 1.34 cm^2. - Tricuspid valve: There was mild regurgitation. - Pulmonary arteries: PA peak pressure: 39 mm Hg (S). - Pericardium, extracardiac: There was a left pleural effusion.  Impressions:  - The right ventricular systolic pressure was increased consistent with mild pulmonary hypertension.   Recent Labs: 08/05/2015: TSH 2.455 09/03/2015: Magnesium 1.9 10/13/2015: B Natriuretic Peptide 438.6 10/21/2015: ALT 19 11/04/2015: BUN 13; Creatinine, Ser 0.53; Hemoglobin 12.4; Platelets 228; Potassium 4.4; Sodium 138    Lipid Panel    Component Value Date/Time   CHOL 181 09/04/2015 0000      Wt Readings from Last 3 Encounters:  03/09/16 99 lb (44.9 kg)  03/01/16 100 lb (45.4 kg)  02/24/16 100 lb (45.4 kg)      ASSESSMENT AND PLAN:  # Chronic systolic and diastolic heart failure:  # Orthostatic hypotension: Diane [name] is euvolemic on exam.  She continues to require drainage of her pleurex catheter three times per week, though the volume is decreasing.  Orthostatic hypotension has improved now that she is on Northera and midodrine was increased.  Continue ivabradine.  She has been  referred to the [facility] for evaluation by Dr. Erenest Rasher.   # Mitral regurgitation s/p mitral valve repair:  Annuloplasty ring was stable on 08/2015. Continue aspirin 81mg  daily.   # Paroxysmal atrial fibrillation: Diane [name] remains in sinus rhythm.  LA appendage was clipped in surgery.  There was no flow in the LAA on cardiac MRI.  She had an episode of atrial fibrillation after being shocked for VT/VF.  Seven day event monitor did not reveal any atrial fibrillation.  Therefore warfarin was discontinued.   # Pleural effusion: Drainage is down to 100-110 mL three times per week.  This is managed by Dr. Cornelius Moras.  # ICD: Managed by Dr. Elberta Fortis  # Arm pain: Diane [name] has bilateral arm pain but no remarkable physical exam findings.  She is generally weak but there is nothing  focal. We will refer her to Neurology.  Current medicines are reviewed at length with the patient today.  The patient does not have concerns regarding medicines.  The following changes have been made:  Stop warfarin.  Stop midodrine.  Increase northera.   Labs/ tests ordered today include:   Orders Placed This Encounter  Procedures  . Ambulatory referral to Neurology    Disposition:   FU with Mazell Aylesworth C. [facility] Salviaandolph, MD in 1 month.   Signed, Chilton Siiffany [place], MD  03/09/2016 1:35 PM    [facility]

## 2016-03-09 NOTE — Patient Instructions (Addendum)
Medication Instructions:  Your physician recommends that you continue on your current medications as directed. Please refer to the Current Medication list given to you today.  Labwork: none  Testing/Procedures: none  Follow-Up: Keep follow up in September  Any Other Special Instructions Will Be Listed Below (If Applicable). You have been referred to Neurology. If you do not hear from the office you may call them directly at [phone]  If you need a refill on your cardiac medications before your next appointment, please call your pharmacy.

## 2016-03-18 ENCOUNTER — Other Ambulatory Visit: Payer: Self-pay | Admitting: Cardiovascular Disease

## 2016-03-18 DIAGNOSIS — R21 Rash and other nonspecific skin eruption: Secondary | ICD-10-CM

## 2016-03-18 NOTE — Telephone Encounter (Signed)
Please call,she has a rash again. She wants you to call in the same thing that Dr [facility] Salvia prescribed before.She also wants to know what is a good calorie intake for her please.

## 2016-03-18 NOTE — Telephone Encounter (Signed)
Patient did inquire about being referred by Dr Gala Romney to [facility] since she has not hear anything Sent message to Ethel Rana RN Dr Gala Romney

## 2016-03-18 NOTE — Telephone Encounter (Signed)
Would discuss with primary care Olga Millers

## 2016-03-18 NOTE — Telephone Encounter (Signed)
Patient stating that she has same itchy rash she had when Dr [facility] Salvia called in 1 dose of Diflucan which cleared it up. Per patient [facility] nurse looked at rash and said looks like yeast rash. Explained to patient would forward to covering MD but he may defer to PCP  Will forward to Dr Jens Som for review   Did discuss caloric intake with patient

## 2016-03-18 NOTE — Telephone Encounter (Signed)
Advised patient

## 2016-03-19 ENCOUNTER — Other Ambulatory Visit: Payer: Self-pay | Admitting: *Deleted

## 2016-03-19 DIAGNOSIS — R21 Rash and other nonspecific skin eruption: Secondary | ICD-10-CM

## 2016-03-19 MED ORDER — NYSTATIN-TRIAMCINOLONE 100000-0.1 UNIT/GM-% EX OINT: 1.0000 "application " | TOPICAL_OINTMENT | Freq: Two times a day (BID) | CUTANEOUS | 0 refills | Status: DC

## 2016-03-19 MED ORDER — NYSTATIN-TRIAMCINOLONE 100000-0.1 UNIT/GM-% EX OINT: 1.0000 "application " | TOPICAL_OINTMENT | Freq: Two times a day (BID) | CUTANEOUS | 1 refills | Status: DC

## 2016-03-19 NOTE — Telephone Encounter (Signed)
RX for nystatin/triamcinolone ointment E-prescribed to [facility]. Patient is aware.

## 2016-03-26 NOTE — Telephone Encounter (Signed)
This encounter was created in error - please disregard.

## 2016-03-29 DIAGNOSIS — Z736 Limitation of activities due to disability: Secondary | ICD-10-CM

## 2016-03-31 ENCOUNTER — Telehealth (HOSPITAL_COMMUNITY): Payer: Self-pay

## 2016-03-31 NOTE — Telephone Encounter (Signed)
[facility] calling [facility]F clinic to request additional medical records to be faxed for recent referral made by Dr. Gala Romney. Demographic sheet and last OV note sent to provided fax # [phone]  Ave Filter, RN

## 2016-04-01 ENCOUNTER — Ambulatory Visit (INDEPENDENT_AMBULATORY_CARE_PROVIDER_SITE_OTHER): Payer: BLUE CROSS/BLUE SHIELD | Admitting: *Deleted

## 2016-04-01 ENCOUNTER — Other Ambulatory Visit: Payer: Self-pay | Admitting: *Deleted

## 2016-04-01 ENCOUNTER — Ambulatory Visit
Admission: RE | Admit: 2016-04-01 | Discharge: 2016-04-01 | Disposition: A | Discharge status: Home / Self Care | Payer: BLUE CROSS/BLUE SHIELD | Source: Ambulatory Visit | Attending: Thoracic Surgery (Cardiothoracic Vascular Surgery) | Admitting: Thoracic Surgery (Cardiothoracic Vascular Surgery)

## 2016-04-01 VITALS — BP 104/66 | HR 89 | Resp 18 | Ht 61.0 in | Wt 96.0 lb

## 2016-04-01 DIAGNOSIS — J90 Pleural effusion, not elsewhere classified: Secondary | ICD-10-CM | POA: Diagnosis not present

## 2016-04-01 DIAGNOSIS — Z9889 Other specified postprocedural states: Secondary | ICD-10-CM

## 2016-04-01 DIAGNOSIS — I5042 Chronic combined systolic (congestive) and diastolic (congestive) heart failure: Secondary | ICD-10-CM

## 2016-04-01 NOTE — Progress Notes (Signed)
Diane [name] has returned to the office to have her pleurX drain checked.  She thinks it is possibly infected.  On exam there is only a small amount of pinkness to the catheter insertion site without drainage/fevers. She does not place the occlusive dressing over the site due to irritation.  Dr. Cornelius Moras saw her, inspected the site and determined no treatment was necessary at this time. Due to the small amount of drainage she is having with twice weekly drain- ings, he said that if there was any sign of infection or trouble with the catheter, to call the office and arrangements would be made to pull the catheter.

## 2016-04-06 ENCOUNTER — Encounter: Payer: Self-pay | Admitting: Cardiovascular Disease

## 2016-04-06 ENCOUNTER — Ambulatory Visit (INDEPENDENT_AMBULATORY_CARE_PROVIDER_SITE_OTHER): Payer: BLUE CROSS/BLUE SHIELD | Admitting: Cardiovascular Disease

## 2016-04-06 VITALS — BP 112/70 | HR 84 | Ht 61.0 in | Wt 97.0 lb

## 2016-04-06 DIAGNOSIS — I5042 Chronic combined systolic (congestive) and diastolic (congestive) heart failure: Secondary | ICD-10-CM

## 2016-04-06 DIAGNOSIS — R05 Cough: Secondary | ICD-10-CM | POA: Diagnosis not present

## 2016-04-06 DIAGNOSIS — G909 Disorder of the autonomic nervous system, unspecified: Secondary | ICD-10-CM

## 2016-04-06 DIAGNOSIS — R55 Syncope and collapse: Secondary | ICD-10-CM

## 2016-04-06 DIAGNOSIS — I951 Orthostatic hypotension: Secondary | ICD-10-CM

## 2016-04-06 DIAGNOSIS — Z9889 Other specified postprocedural states: Secondary | ICD-10-CM

## 2016-04-06 DIAGNOSIS — R053 Chronic cough: Secondary | ICD-10-CM

## 2016-04-06 NOTE — Patient Instructions (Signed)
Medication Instructions:  Continue current medications  Labwork: None Ordered  Testing/Procedures: None Ordered  Follow-Up: You have been referred to Pulmonologist  Your physician recommends that you schedule a follow-up appointment in: 1 Month with Dr [facility] Salvia   Any Other Special Instructions Will Be Listed Below (If Applicable).   If you need a refill on your cardiac medications before your next appointment, please call your pharmacy.

## 2016-04-06 NOTE — Progress Notes (Signed)
Cardiology Office Note   Date:  04/06/2016   ID:  Diane [name], DOB 1960/09/03, MRN [id number]  PCP:  Darrow Bussing, MD  Cardiologist:   Chilton Si, MD  Electrophysiologist: Dr. Berton Mount  Chief Complaint  Patient presents with  . Appointment    has had some chest discomfort. SHOB with and without activities. no swelling. no tired     [name] is a 55 y.o. female who presents for follow up on mitral valve repair and chronic systolic and diastolic heart failure.   History of Present Illness Diane [name] initially presented 05/2015 with recurrent syncope and progressive shortness of breath.  She previously had an exercise Myoview 07/2013 that was negative for ischemia.  She underwent echocardiography 05/2015 that revealed severe mitral regurgitation and a reduced LVEF (45-50%). The MR was due to tethering of the posterior leaflet. She was also noted to have grade 3 diastolic dysfunction with mid basal to mid inferolateral wall hypokinesis.  Diane [name] underwent repair of her mitral valve with a 26 mm Sorin Memo 3D annuloplasty ring on 07/22/15. Intra-operatively she was noted to have a PDA with left to right shunting that was ligated in the OR. The left atrium was also clipped. Her post operative course was initially unremarkable and she was discharged on post-op day 5. On 1/17 she suffered a cardiac arrest at home and was resuscitated by EMS. The initial rhythm was unclear but she had VT upon arrival in the ED, which was followed by atrial fibrillation. She was treated with hypothermia and has no residual cognitive deficits. During that hospitalization she underwent cardiac MRI with LVEF 45% with hypokinesis of the distal septum and apex. There was diffuse, delayed enhancement in the subendocardium and mid epicardial region, most prominent in the inferior wall. This was concerning for myocarditis or infiltrative cardiomyopathy. Her EKG showed QT  prolongation and she had an ICD placed for secondary prevention. Her hospitalization was delayed by severe orthostatic hypotension and recurrent syncope requiring high doses of florinef and midodrine. She was discharged home but was readmitted with volume overload on 2/16. Echo showed LVEF 35-40% with diffuse hypokinesis and her mitral valve was functioning well.  Diane [name] wore an event monitor on 12/22/15 that did not reveal any abnormalities.  Given that her only episode of atrial fibrillation occurred perioperatively and her left atrial appendage has been clipped, warfarin was discontinued.  She had cortisol levels repeated 01/22/16, which were normal.  She also had testing for acetylcholine receptor antibodies and striated muscle antibodies which were both unremarkable.   Since her initial hospitalization she has struggled with hypotension and recurrent L pleural effusion.  She had a pleurex catheter placed in the L chest. She continued to have dizziness and orthostatic hypotension so she was started on Northera.  Florinef was previously discontinued due to edema. She was doing well on Northera and midodrine but had syncope when midodrine was stopped.  Midodrine was added back at 5 mg and she continued to have syncope twice last week so it was increased back to 10 mg.  Diane [name] is frustrated by her slow progress.  She feels tired in the morning until she takes her medication at 7am.  She then feels better until around 12:30, when she gets very lightheaded.  She had two falls since her last appointment.  She doesn't think it was syncope, but she got very lightheaded, weak and fell on her face.  When she checked her BP it was  50s systolic.  After she takes her afternoon dose she feels better.    Diane [name] draines 75 mg three times per week from her Pleurex catheter.  She hopes to have it removed next month.  Her breathing has improved and she denies lower extremity edema, orthopnea or PND.  She is  still troubled by a cough, which she reminds me, has been ongoing since before she had surgery.  It is non-productive but very annoying for her.  She also struggles to maintain and gain weight.  Food tastes either too sweet or too bitter, and she is often too tired or weak to cook for herself.     Past Medical History:  Diagnosis Date  . AICD (automatic cardioverter/defibrillator) present   . Anemia   . Anxiety   . Aortic aneurysm (HCC)   . Arthritis   . Cardiac arrest (HCC)   . Chronic combined systolic and diastolic CHF (congestive heart failure) (HCC) 07/03/2015   Grade 3 diastolic dysfunction.  LVEF 40-45% 05/2015.  Marland Kitchen Depression    SOME DEPRESSION (ON MEDS FOR 2 YRS) WHEN HER MOTHER PASSED  . Patent ductus arteriosus 07/16/2015  . 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 07/22/2015   26 mm Sorin Memo 3D ring annuloplasty with ligation of patent ductus arteriosus and clipping of LA appendage  . Severe mitral regurgitation 07/03/2015  . Shortness of breath dyspnea     Past Surgical History:  Procedure Laterality Date  . CARDIAC CATHETERIZATION N/A 07/08/2015   Procedure: Right/Left Heart Cath and Coronary Angiography;  Surgeon: Marykay Lex, MD;  Location: [facility] INVASIVE CV LAB;  Service: Cardiovascular;  Laterality: N/A;  . CHEST TUBE INSERTION Left 10/21/2015   Procedure: INSERTION left PLEURAL DRAINAGE CATHETER;  Surgeon: Purcell Nails, MD;  Location: [facility] OR;  Service: Thoracic;  Laterality: Left;  . CLIPPING OF ATRIAL APPENDAGE  07/22/2015   Procedure: CLIPPING OF LEFT ATRIAL APPENDAGE;  Surgeon: Purcell Nails, MD;  Location: [facility] OR;  Service: Open Heart Surgery;;  [address]  . COLONOSCOPY    . EP IMPLANTABLE DEVICE N/A 08/15/2015   Procedure: ICD Implant;  Surgeon: Will Jorja Loa, MD;  Location: [facility] INVASIVE CV LAB;  Service: Cardiovascular;  Laterality: N/A;  . MICROLARYNGOSCOPY WITH CO2 LASER AND EXCISION OF VOCAL CORD LESION N/A 11/04/2015    Procedure: MICROLARYNGOSCOPY, RADIESSE VOCAL CORD AUGMENTATION;  Surgeon: Flo Shanks, MD;  Location[facility] OR;  Service: ENT;  Laterality: N/A;  . MITRAL VALVE REPAIR N/A 07/22/2015   Procedure: MITRAL VALVE REPAIR;  Surgeon: Purcell Nails, MD;  Location: [facility] OR;  Service: Open Heart Surgery;  Laterality: N/A;  26 Sorin Memo 3D  . OVARIAN CYST REMOVAL    . PATENT DUCTUS ARTERIOUS REPAIR N/A 07/22/2015   Procedure: PATENT DUCTUS ARTERIOSUS (PDA) ligation;  Surgeon: Purcell Nails, MD;  Location: [facility] OR;  Service: Open Heart Surgery;  Laterality: N/A;  . TEE WITHOUT CARDIOVERSION N/A 07/07/2015   Procedure: TRANSESOPHAGEAL ECHOCARDIOGRAM (TEE);  Surgeon: Chilton Si, MD;  Location: [facility] ENDOSCOPY;  Service: Cardiovascular;  Laterality: N/A;  . TEE WITHOUT CARDIOVERSION N/A 07/22/2015   Procedure: TRANSESOPHAGEAL ECHOCARDIOGRAM (TEE);  Surgeon: Purcell Nails, MD;  Location: [facility] OR;  Service: Open Heart Surgery;  Laterality: N/A;     Current Outpatient Prescriptions  Medication Sig Dispense Refill  . aspirin 81 MG tablet Take 81 mg by mouth daily.    . Droxidopa (NORTHERA) 100 MG CAPS Take 1 capsule by mouth  3 (three) times daily. 90 capsule 1  . ivabradine (CORLANOR) 5 MG TABS tablet Take 1 tablet (5 mg total) by mouth 2 (two) times daily with a meal. 60 tablet 6  . midodrine (PROAMATINE) 10 MG tablet Take 1 tablet (10 mg total) by mouth 3 (three) times daily with meals. 90 tablet 3  . pregabalin (LYRICA) 25 MG capsule Take 25 mg by mouth as directed. Pt takes 25 mg bi-weekly     No current facility-administered medications for this visit.     Allergies:   Vicodin [hydrocodone-acetaminophen]; Adhesive [tape]; Cymbalta [duloxetine hcl]; Oxycodone; and Silicone    Social History:  The patient  reports that she has never smoked. She has never used smokeless tobacco. She reports that she does not drink alcohol or use drugs.   Family History:  The patient's family history includes Hypertension in  her mother.    ROS:  Please see the history of present illness.  Otherwise, review of systems are positive for bilateral arm pain and cold hands.   All other systems are reviewed and negative.    PHYSICAL EXAM: VS:  BP 112/70   Pulse 84   Ht 5\' 1"  (1.549 m)   Wt 97 lb (44 kg)   BMI 18.33 kg/m  , BMI Body mass index is 18.33 kg/m. GENERAL:  Chronically ill-appearing.  Appears mildly ill and weak.  Voice is getting weaker. HEENT:  Pupils equal round and reactive, fundi not visualized, oral mucosa unremarkable.  Hoarse voice NECK:  No jugular venous distention, waveform within normal limits, carotid upstroke brisk and symmetric, no bruits\ LYMPHATICS:  No cervical adenopathy LUNGS:  Clear to auscultation bilaterally. HEART:  RRR.  PMI not displaced or sustained,S1 and S2 within normal limits, no S3, no S4, no clicks, no rubs, no murmurs Chest: L pleurex catheter.   ABD:  Flat, positive bowel sounds normal in frequency in pitch, no bruits, no rebound, no guarding, no midline pulsatile mass, no hepatomegaly, no splenomegaly EXT:  2 plus pulses throughout, no edema, no cyanosis no clubbing SKIN:  No rashes no nodules NEURO:  Cranial nerves II through XII grossly intact, motor grossly intact throughout PSYCH:  Cognitively intact, oriented to person place and time  EKG:  EKG is not ordered today.   12/06/15: Sinus rhythm. Rate 78 bpm. Left bundle-branch block.  Left ventricular hypertrophy. Left anterior fascicular block.   Echo 06/04/15: Study Conclusions  - Left ventricle: The cavity size was normal. Wall thickness was increased in a pattern of mild LVH. Systolic function was mildly reduced. The estimated ejection fraction was in the range of 45% to 50%. Abnormal GLPSS at -11%, with inferior and inferolateral strain abnormality. Basal to mid inferolateral wall hypokinesis. Doppler parameters are consistent with restrictive left ventricular relaxation (grade 3 diastolic  dysfunction). The E/A ratio is >2.5. The E/e&' ratio is >20, suggesting markedly elevated LV filling pressure. - Mitral valve: Mildly thickened leaflets with tethering of the posterior leaflet. There is severe, posteriorly directed regurgitation which hugs the atrial free wall. Reversal of flow is seen in the pulmonary vein. - Left atrium: Severely dilated at 52 ml/m2. - Right atrium: The atrium was mildly dilated. - Tricuspid valve: There was mild regurgitation. - Pulmonary arteries: PA peak pressure: 41 mm Hg (S). - Inferior vena cava: The vessel was normal in size. The respirophasic diameter changes were in the normal range (>= 50%), consistent with normal central venous pressure. - Pericardium, extracardiac: A trivial pericardial effusion was identified posterior  to the heart. Features were not consistent with tamponade physiology.  Impressions:  - LVEF 45-50%, mild LVH, abnormal GLPSS at -11% with inferolateral strain and wall motion abnormalities. There is tethering of the posterior mitral leaflet and severe posteriorly directed mitral regurgitation. Restrictive diastolic dysfunction with likely elevated LV filling pressures. Severely dilated LA at 52 ml/m2, mild TR, RVSP elevated at 41 mmHg (consistent with mild pulmonary venous hypertension), trivial posterior pericardial effusion without tamponade physiology.  PFT 11/05/14:  FEV1  1.9 L FVC : 2.4 L FEV1 and FVC are reduced, but the FEV1/FVC ratio is normal.  Following adminitration of bronchodilator there was no significant response.  Consistent with minimal obstructive airway disease.    LHC 07/08/15:  Severe mitral regurgitation with large V waves and 4+ MR  There is mild to moderate left ventricular systolic dysfunction. With cardiac output/index moderate-severely reduced.  Angiographically normal coronary arteries  Cardiac MRI 08/14/15: IMPRESSION: 1) Mildly asymmetric septal  hypertrophy 15 mm. Distal septal apical hypokinesis EF 45%  2) Diffuse delayed gadolinium uptake in the subendocardial and mid epicardial areas especially marked in the inferior wall  3) S/P mitral valve repair with no residual MR and annuloplasty ring  4) Atri-Clip of the LAA with no flow  5) No residual PDA flow  6) Mild LAE  7) Small pericardial effusion  8) Large left pleural effusion  Note delayed gadolinium findings may be consistent with myocarditis vs infiltrative cardiomyopathy  Echo 09/04/15: Study Conclusions  - Left ventricle: The cavity size was normal. There was mild concentric hypertrophy. Systolic function was moderately reduced. The estimated ejection fraction was in the range of 35% to 40%. Diffuse hypokinesis. - Aortic valve: Trileaflet; mildly thickened, mildly calcified leaflets. There was trivial regurgitation. - Mitral valve: S/P mitral valve repair with annuloplasty ring. The findings are consistent with mild stenosis. Valve area by continuity equation (using LVOT flow): 1.34 cm^2. - Tricuspid valve: There was mild regurgitation. - Pulmonary arteries: PA peak pressure: 39 mm Hg (S). - Pericardium, extracardiac: There was a left pleural effusion.  Impressions:  - The right ventricular systolic pressure was increased consistent with mild pulmonary hypertension.   Recent Labs: 08/05/2015: TSH 2.455 09/03/2015: Magnesium 1.9 10/13/2015: B Natriuretic Peptide 438.6 10/21/2015: ALT 19 11/04/2015: BUN 13; Creatinine, Ser 0.53; Hemoglobin 12.4; Platelets 228; Potassium 4.4; Sodium 138    Lipid Panel    Component Value Date/Time   CHOL 181 09/04/2015 0000      Wt Readings from Last 3 Encounters:  04/06/16 97 lb (44 kg)  04/01/16 96 lb (43.5 kg)  03/09/16 99 lb (44.9 kg)      ASSESSMENT AND PLAN:  # Chronic systolic and diastolic heart failure:  # Orthostatic hypotension: Diane [name] is euvolemic on exam.  She continues  to require drainage of her pleurex catheter three times per week, though the volume is down to 75mL each time.  She is hopeful that the drain can be removed next week.  Orthostatic hypotension has improved now that she is on Northera and midodrine was increased.  However, her blood pressure seems to drop just prior to her afternoon dose.  We will stagger the midodrine and Northera to avoid troughs.  She is hesitant to increase the dose.  Continue ivabradine.  She has been referred to the [facility] Syncope clinic for evaluation by Dr. Erenest Rasher. She is not on a beta blocker or ACE-I/ARB due to hypotension.   # Mitral regurgitation s/p mitral valve repair:  Annuloplasty ring was  stable on 08/2015. Continue aspirin 81mg  daily.    # Paroxysmal atrial fibrillation: Diane [name] remains in sinus rhythm.  LA appendage was clipped in surgery.  There was no flow in the LAA on cardiac MRI.  She had an episode of atrial fibrillation after being shocked for VT/VF.  Seven day event monitor did not reveal any atrial fibrillation.  Therefore warfarin was discontinued.   # Pleural effusion: Drainage is down to 75 mL three times per week.  Catheter will likely be removed next month.  This is managed by Dr. Cornelius Moraswen.  # ICD: Managed by Dr. Elberta Fortisamnitz  # Cough: Diane [name] has a cough that has not improved since before her surgery.  She is euvolemic and has no evidence of infection.  We will refer her to pulmonary for evaluation.   Current medicines are reviewed at length with the patient today.  The patient does not have concerns regarding medicines.  The following changes have been made:  Stagger midodrine and Northera.   Labs/ tests ordered today include:   No orders of the defined types were placed in this encounter.   Disposition:   FU with Keyah Blizard C. Duke Salviaandolph, MD in 1 month.   Signed, Chilton Siiffany Luther, MD  04/06/2016 9:13 AM    [facility]

## 2016-05-03 ENCOUNTER — Encounter: Payer: BLUE CROSS/BLUE SHIELD | Admitting: Thoracic Surgery (Cardiothoracic Vascular Surgery)

## 2016-05-05 ENCOUNTER — Telehealth: Payer: Self-pay | Admitting: Cardiovascular Disease

## 2016-05-05 NOTE — Telephone Encounter (Signed)
Received records from [facility] - Electrophysiology for appointment on 05/25/16 with Dr Duke Salvia.  Records given to [facility] (medical records) for Dr Leonides Sake schedule on 05/25/16. lp

## 2016-05-10 ENCOUNTER — Encounter: Payer: BLUE CROSS/BLUE SHIELD | Admitting: Thoracic Surgery (Cardiothoracic Vascular Surgery)

## 2016-05-10 ENCOUNTER — Encounter: Payer: Self-pay | Admitting: Emergency Medicine

## 2016-05-10 ENCOUNTER — Ambulatory Visit (INDEPENDENT_AMBULATORY_CARE_PROVIDER_SITE_OTHER): Payer: BLUE CROSS/BLUE SHIELD | Admitting: Emergency Medicine

## 2016-05-10 DIAGNOSIS — R05 Cough: Secondary | ICD-10-CM | POA: Diagnosis not present

## 2016-05-10 DIAGNOSIS — R059 Cough, unspecified: Secondary | ICD-10-CM | POA: Insufficient documentation

## 2016-05-10 MED ORDER — ESOMEPRAZOLE MAGNESIUM 40 MG PO CPDR: 40.0000 mg | DELAYED_RELEASE_CAPSULE | Freq: Every day | ORAL | 1 refills | Status: DC

## 2016-05-10 NOTE — Assessment & Plan Note (Signed)
Etiology not immediately clear although multiple potential contributors possible. She does not relate the cough to the times that she drains her pleural fluid so difficult to relate the Pleurx to the symptoms. Also she has cough that predated placement of that catheter. She denies a dysphasia and also has a noted vocal cord lesion post intubation. Either of these could be contributing. In particular dysphasia plus occult reflux could be a factor. Believe that given her multiple chest x-rays without clear etiology we should perform a CT scan of the chest to better evaluate her airways and her parenchyma. I like to start her empirically on Nexium to see if she improves and thereby establish whether there is a contribution of reflux. Will need to review her case with dr Lazarus Salines after this eval. she did have some possible evidence for mild obstruction on spirometry although her methacholine challenge was normal. We could consider starting an empiric bronchodilator as we go forward.

## 2016-05-10 NOTE — Patient Instructions (Signed)
We will perform a CT scan of your chest  We will try starting nexium 40mg  daily until next visit We may need to review your case with Dr Lazarus Salines to determine whether any further intervention needs to be made with your vocal cords.  Continue your safe swallowing techniques.  Follow with Dr Delton Coombes in 1 month

## 2016-05-10 NOTE — Progress Notes (Signed)
Subjective:    [name], female    DOB: 10-12-1960, 55 y.o.   MRN: [id number]  HPI 55 year old woman with a copied a past medical history including coronary artery disease and global hypokinesis by echocardiogram, orthostatic hypotension, paroxysmal atrial fibrillation. She underwent a mitral valve repair for mitral regurgitation in February 2017. Course complicated by a recurrent left pleural effusion for which she underwent left Pleurx catheter placement 10/21/15. 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 She was noted to have aphonia post operatively - was evaluated by ENT, she indicates that she had botox injection, does not relate CO2 laser.   She is not on an ACE inhibitor or an ARB.   She describes 2 kinds of cough > one kind that has been occurring since surgery and ENT eval that happens with eating / drinking / aspiration sx. She has had SLP eval and uses some techniques that help. She has some dysphagia, feels that her food may get stuck, doesn;lt really perceive reflux at these times.   The cough that preceded her surgery can happen at any time. Non-productive initially, now with some thick mucous hard to clear. Happens any time, often when talking. Can wake her from sleep, happens freq when she is supine. She has been treated empirically w allergy shots, allergy regimen without improvement.   Methacholine challenge was performed 10/14/15 that I reviewed >> no airways hyperresponsiveness but there was some evidence mixed disease, possible mild obstruction.   She has occasional reflux sx but certainly not daily.    Review of Systems  Constitutional: Negative for fever and unexpected weight change.  HENT: Negative for congestion, dental problem, ear pain, nosebleeds, postnasal drip, rhinorrhea, sinus pressure, sneezing, sore throat and trouble swallowing.   Eyes: Negative for redness and itching.    Respiratory: Positive for cough, chest tightness and shortness of breath. Negative for wheezing.   Cardiovascular: Negative for palpitations and leg swelling.  Gastrointestinal: Negative for nausea and vomiting.  Genitourinary: Negative for dysuria.  Musculoskeletal: Negative for joint swelling.  Skin: Negative for rash.  Neurological: Negative for headaches.  Hematological: Does not bruise/bleed easily.  Psychiatric/Behavioral: Negative for dysphoric mood. The patient is not nervous/anxious.    Past Medical History:  Diagnosis Date  . AICD (automatic cardioverter/defibrillator) present   . Anemia   . Anxiety   . Aortic aneurysm (HCC)   . Arthritis   . Cardiac arrest (HCC)   . Chronic combined systolic and diastolic CHF (congestive heart failure) (HCC) 07/03/2015   Grade 3 diastolic dysfunction.  LVEF 40-45% 05/2015.  Marland Kitchen Depression    SOME DEPRESSION (ON MEDS FOR 2 YRS) WHEN HER MOTHER PASSED  . Patent ductus arteriosus 07/16/2015  . 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 07/22/2015   26 mm Sorin Memo 3D ring annuloplasty with ligation of patent ductus arteriosus and clipping of LA appendage  . Severe mitral regurgitation 07/03/2015  . Shortness of breath dyspnea      Family History  Problem Relation Age of Onset  . Hypertension Mother      Social History   Social History  . Marital status: Married    Spouse name: N/A  . Number of children: 2  . Years of education: N/A   Occupational History  . Seamtress    Social History Main Topics  . Smoking status: Never Smoker  . Smokeless tobacco: Never Used  .  Alcohol use No  . Drug use: No  . Sexual activity: Not on file   Other Topics Concern  . Not on file   Social History Narrative  . No narrative on file     Allergies  Allergen Reactions  . Vicodin [Hydrocodone-Acetaminophen] Other (See Comments)    "she feels out of it", hallucinating  . Adhesive [Tape] Itching and Rash  . Cymbalta  [Duloxetine Hcl] Nausea And Vomiting and Other (See Comments)    Excessive dizziness  . Oxycodone Nausea Only    Makes her feel strange   . Silicone Itching and Rash    TAPE ALLERGY/EKG STICKER ALLERGY, Please use "paper" tape only     Outpatient Medications Prior to Visit  Medication Sig Dispense Refill  . aspirin 81 MG tablet Take 81 mg by mouth daily.    . ivabradine (CORLANOR) 5 MG TABS tablet Take 1 tablet (5 mg total) by mouth 2 (two) times daily with a meal. 60 tablet 6  . midodrine (PROAMATINE) 10 MG tablet Take 1 tablet (10 mg total) by mouth 3 (three) times daily with meals. 90 tablet 3  . pregabalin (LYRICA) 25 MG capsule Take 25 mg by mouth as directed. Pt takes 25 mg bi-weekly    . Droxidopa (NORTHERA) 100 MG CAPS Take 1 capsule by mouth 3 (three) times daily. 90 capsule 1   No facility-administered medications prior to visit.         Objective:   Physical Exam Vitals:   05/10/16 0900 05/10/16 0901  BP:  110/80  Pulse:  82  SpO2:  100%  Weight: 98 lb (44.5 kg)   Height: 5\' 1"  (1.549 m)    Gen: thin woman, in no distress,  normal affect  ENT: No lesions,  mouth clear, soft voice, weak cough,   Neck: No JVD, no TMG, no carotid bruits, mild insp stridor  Lungs: No use of accessory muscles, clear without rales or rhonchi, L pleurex in place  Cardiovascular: RRR, heart sounds normal, no murmur or gallops, no peripheral edema  Musculoskeletal: No deformities, no cyanosis or clubbing  Neuro: alert, non focal  Skin: Warm, no lesions or rashes     Assessment & Plan:  Cough Etiology not immediately clear although multiple potential contributors possible. She does not relate the cough to the times that she drains her pleural fluid so difficult to relate the Pleurx to the symptoms. Also she has cough that predated placement of that catheter. She denies a dysphasia and also has a noted vocal cord lesion post intubation. Either of these could be contributing. In  particular dysphasia plus occult reflux could be a factor. Believe that given her multiple chest x-rays without clear etiology we should perform a CT scan of the chest to better evaluate her airways and her parenchyma. I like to start her empirically on Nexium to see if she improves and thereby establish whether there is a contribution of reflux. Will need to review her case with dr Lazarus SalinesWolicki after this eval. she did have some possible evidence for mild obstruction on spirometry although her methacholine challenge was normal. We could consider starting an empiric bronchodilator as we go forward.   Levy Pupaobert Nili Honda, MD, PhD 05/10/2016, 9:32 AM [facility] [phone] or if no answer [phone]

## 2016-05-12 ENCOUNTER — Ambulatory Visit (INDEPENDENT_AMBULATORY_CARE_PROVIDER_SITE_OTHER): Payer: BLUE CROSS/BLUE SHIELD | Admitting: *Deleted

## 2016-05-12 DIAGNOSIS — I472 Ventricular tachycardia, unspecified: Secondary | ICD-10-CM

## 2016-05-12 NOTE — Progress Notes (Signed)
Remote ICD transmission.   

## 2016-05-13 ENCOUNTER — Encounter: Payer: Self-pay | Admitting: Cardiology

## 2016-05-13 ENCOUNTER — Ambulatory Visit (INDEPENDENT_AMBULATORY_CARE_PROVIDER_SITE_OTHER)
Admission: RE | Admit: 2016-05-13 | Discharge: 2016-05-13 | Disposition: A | Discharge status: Home / Self Care | Payer: BLUE CROSS/BLUE SHIELD | Source: Ambulatory Visit | Attending: Emergency Medicine | Admitting: Emergency Medicine

## 2016-05-13 DIAGNOSIS — R05 Cough: Secondary | ICD-10-CM

## 2016-05-13 DIAGNOSIS — R059 Cough, unspecified: Secondary | ICD-10-CM

## 2016-05-18 ENCOUNTER — Ambulatory Visit: Payer: Self-pay | Admitting: Pharmacist Clinician (PhC)/ Clinical Pharmacy Specialist

## 2016-05-18 DIAGNOSIS — Z9889 Other specified postprocedural states: Secondary | ICD-10-CM

## 2016-05-20 IMAGING — CR DG CHEST 1V PORT
1 series · 1 of 1 positions shown · non-contrast
Comparison: 08/11/2015

CLINICAL DATA: Endotracheal tube.  Shortness of breath.

EXAM:
PORTABLE CHEST 1 VIEW

[AP]
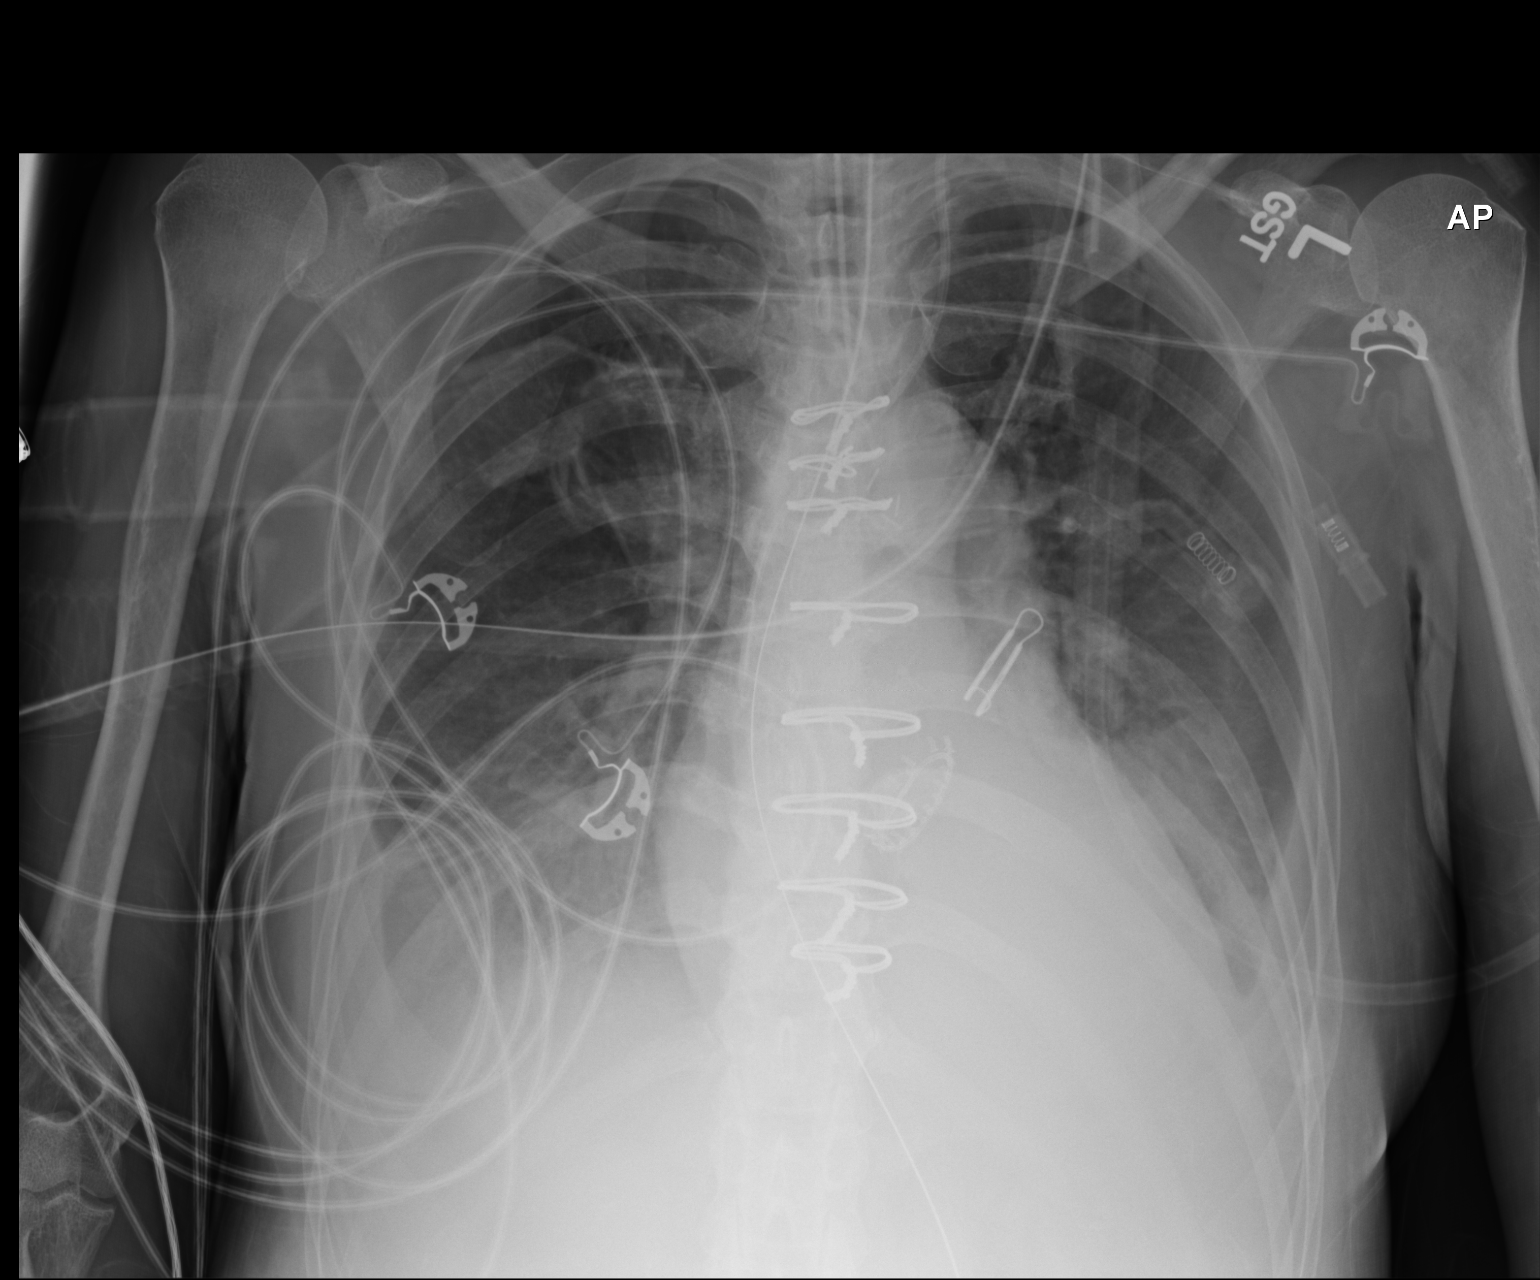

[1 of 1 positions shown; findings below may reference images not displayed]

FINDINGS: Endotracheal tube with tip measuring 4.5 cm above the carina.
Enteric tube tip is off the field of view but below the left
hemidiaphragm. Left central venous catheter with tip over the low
SVC region. Normal heart size and pulmonary vascularity. Shallow
inspiration. Small bilateral pleural effusions with basilar
atelectasis or infiltration. No definite change since previous
study, allowing for differences in technique. Postoperative changes
in the mediastinum.
IMPRESSION: Appliances appear to be in satisfactory location. Small bilateral
pleural effusions with basilar atelectasis or infiltration.

## 2016-05-25 ENCOUNTER — Ambulatory Visit: Payer: BLUE CROSS/BLUE SHIELD | Admitting: Cardiovascular Disease

## 2016-05-28 ENCOUNTER — Encounter: Payer: Self-pay | Admitting: Cardiology

## 2016-06-09 LAB — CUP PACEART REMOTE DEVICE CHECK
Battery Voltage: 3.03 V
Brady Statistic AP VP Percent: 0 %
Brady Statistic RA Percent Paced: 0.02 %
Date Time Interrogation Session: 20171025052403
HighPow Impedance: 53 Ohm
Implantable Lead Implant Date: 20170127
Implantable Lead Location: 753860
Implantable Pulse Generator Implant Date: 20170127
Lead Channel Impedance Value: 399 Ohm
Lead Channel Pacing Threshold Pulse Width: 0.4 ms
Lead Channel Sensing Intrinsic Amplitude: 2.375 mV
Lead Channel Sensing Intrinsic Amplitude: 7.875 mV
Lead Channel Setting Pacing Amplitude: 1.5 V
MDC IDC LEAD IMPLANT DT: 20170127
MDC IDC LEAD LOCATION: 753859
MDC IDC MSMT BATTERY REMAINING LONGEVITY: 130 mo
MDC IDC MSMT LEADCHNL RA IMPEDANCE VALUE: 399 Ohm
MDC IDC MSMT LEADCHNL RA PACING THRESHOLD AMPLITUDE: 0.75 V
MDC IDC MSMT LEADCHNL RA SENSING INTR AMPL: 2.375 mV
MDC IDC MSMT LEADCHNL RV IMPEDANCE VALUE: 285 Ohm
MDC IDC MSMT LEADCHNL RV PACING THRESHOLD AMPLITUDE: 0.75 V
MDC IDC MSMT LEADCHNL RV PACING THRESHOLD PULSEWIDTH: 0.4 ms
MDC IDC MSMT LEADCHNL RV SENSING INTR AMPL: 7.875 mV
MDC IDC SET LEADCHNL RV PACING AMPLITUDE: 2 V
MDC IDC SET LEADCHNL RV PACING PULSEWIDTH: 0.4 ms
MDC IDC SET LEADCHNL RV SENSING SENSITIVITY: 0.3 mV
MDC IDC STAT BRADY AP VS PERCENT: 0.02 %
MDC IDC STAT BRADY AS VP PERCENT: 0.02 %
MDC IDC STAT BRADY AS VS PERCENT: 99.96 %
MDC IDC STAT BRADY RV PERCENT PACED: 0.03 %

## 2016-06-11 IMAGING — CR DG CHEST 1V PORT
1 series · 1 of 1 positions shown · non-contrast
Comparison: September 02, 2015

CLINICAL DATA: Pleural effusion

EXAM:
PORTABLE CHEST 1 VIEW

[AP]
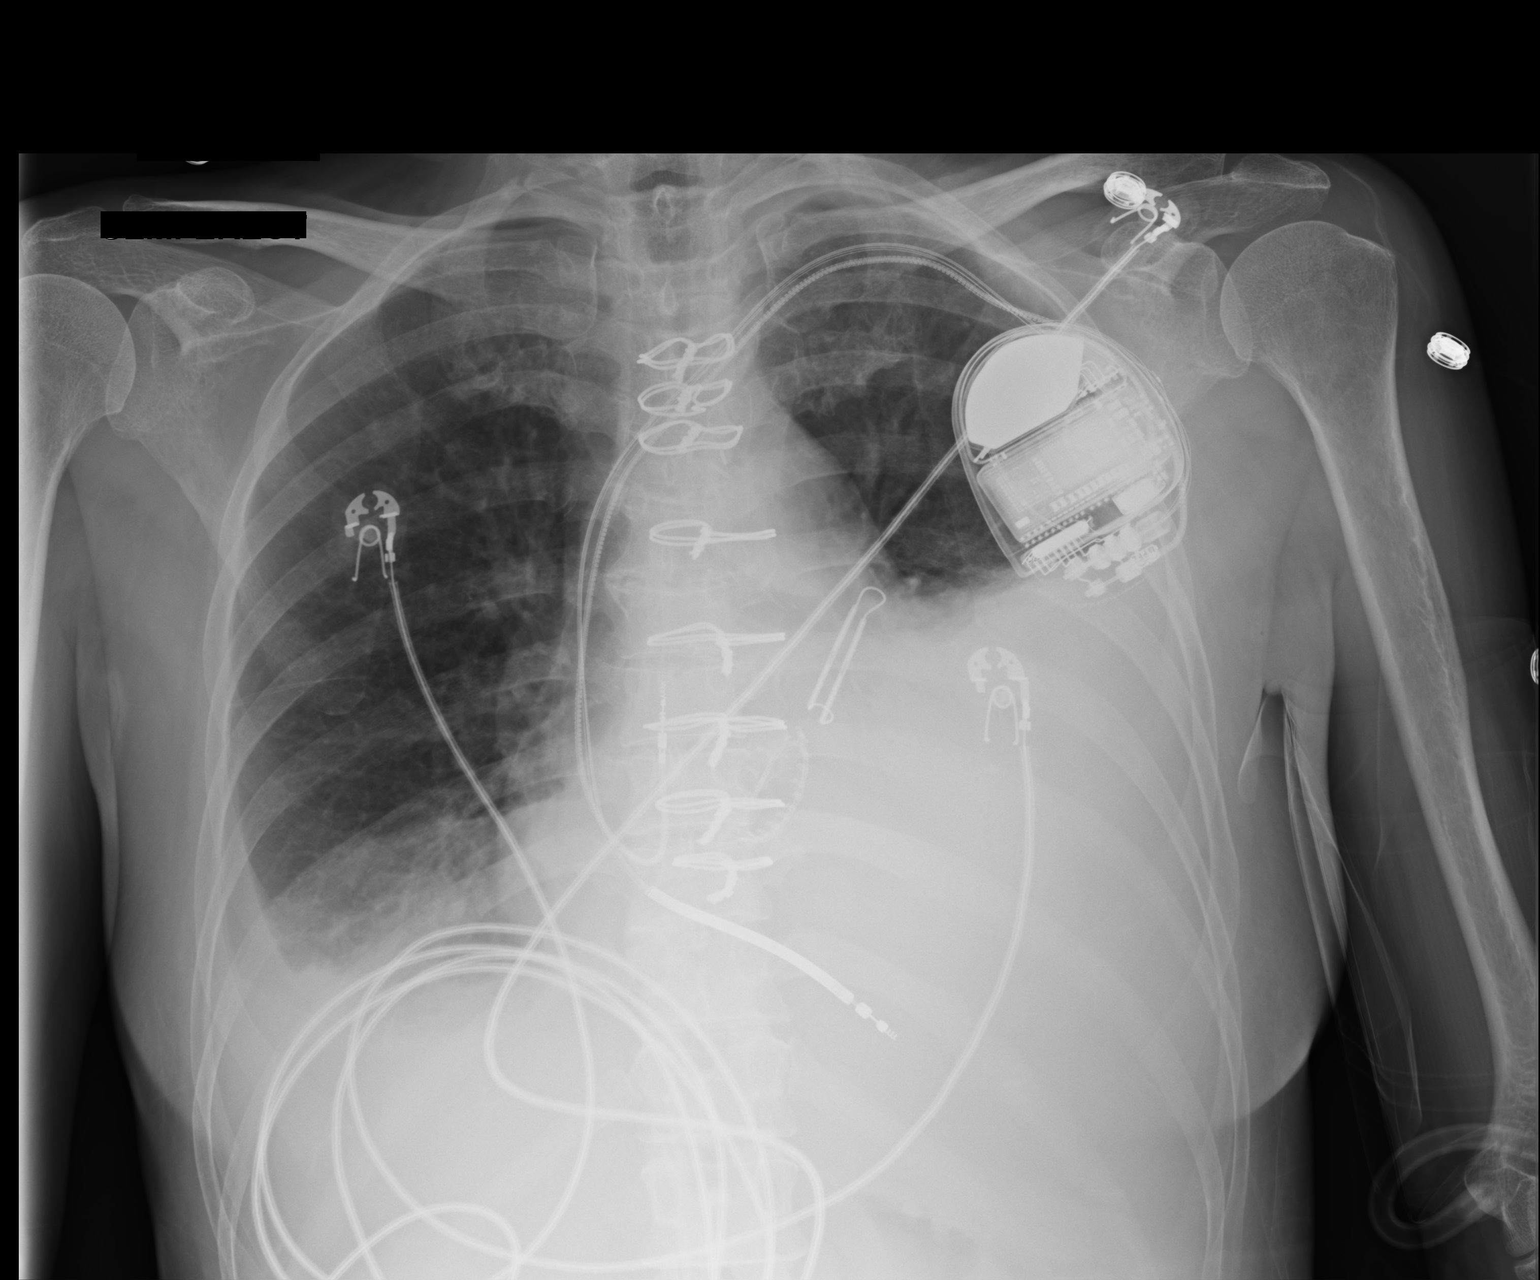

[1 of 1 positions shown; findings below may reference images not displayed]

FINDINGS: There is a sizable pleural effusion on the left with left mid and
lower lung zone airspace consolidation. There is a much smaller
right effusion with right base atelectasis. There is cardiac
enlargement with pulmonary vascularity within normal limits, stable.
Pacemaker leads are attached to the right atrium and right
ventricle. Patient is status post mitral valve replacement. A left
atrial appendage clamp is present. No adenopathy is apparent. No
apparent pneumothorax.
IMPRESSION: Bilateral pleural effusions, considerably larger on the left than on
the right, stable. Left mid lower lung zone airspace consolidation.
Patchy atelectasis right base. No change in cardiac silhouette. No
pneumothorax.

## 2016-06-11 IMAGING — CR DG CHEST 1V PORT
1 series · 1 of 1 positions shown · non-contrast
Comparison: Earlier same day.

CLINICAL DATA: Follow-up left thoracentesis.

EXAM:
PORTABLE CHEST 1 VIEW

[AP]
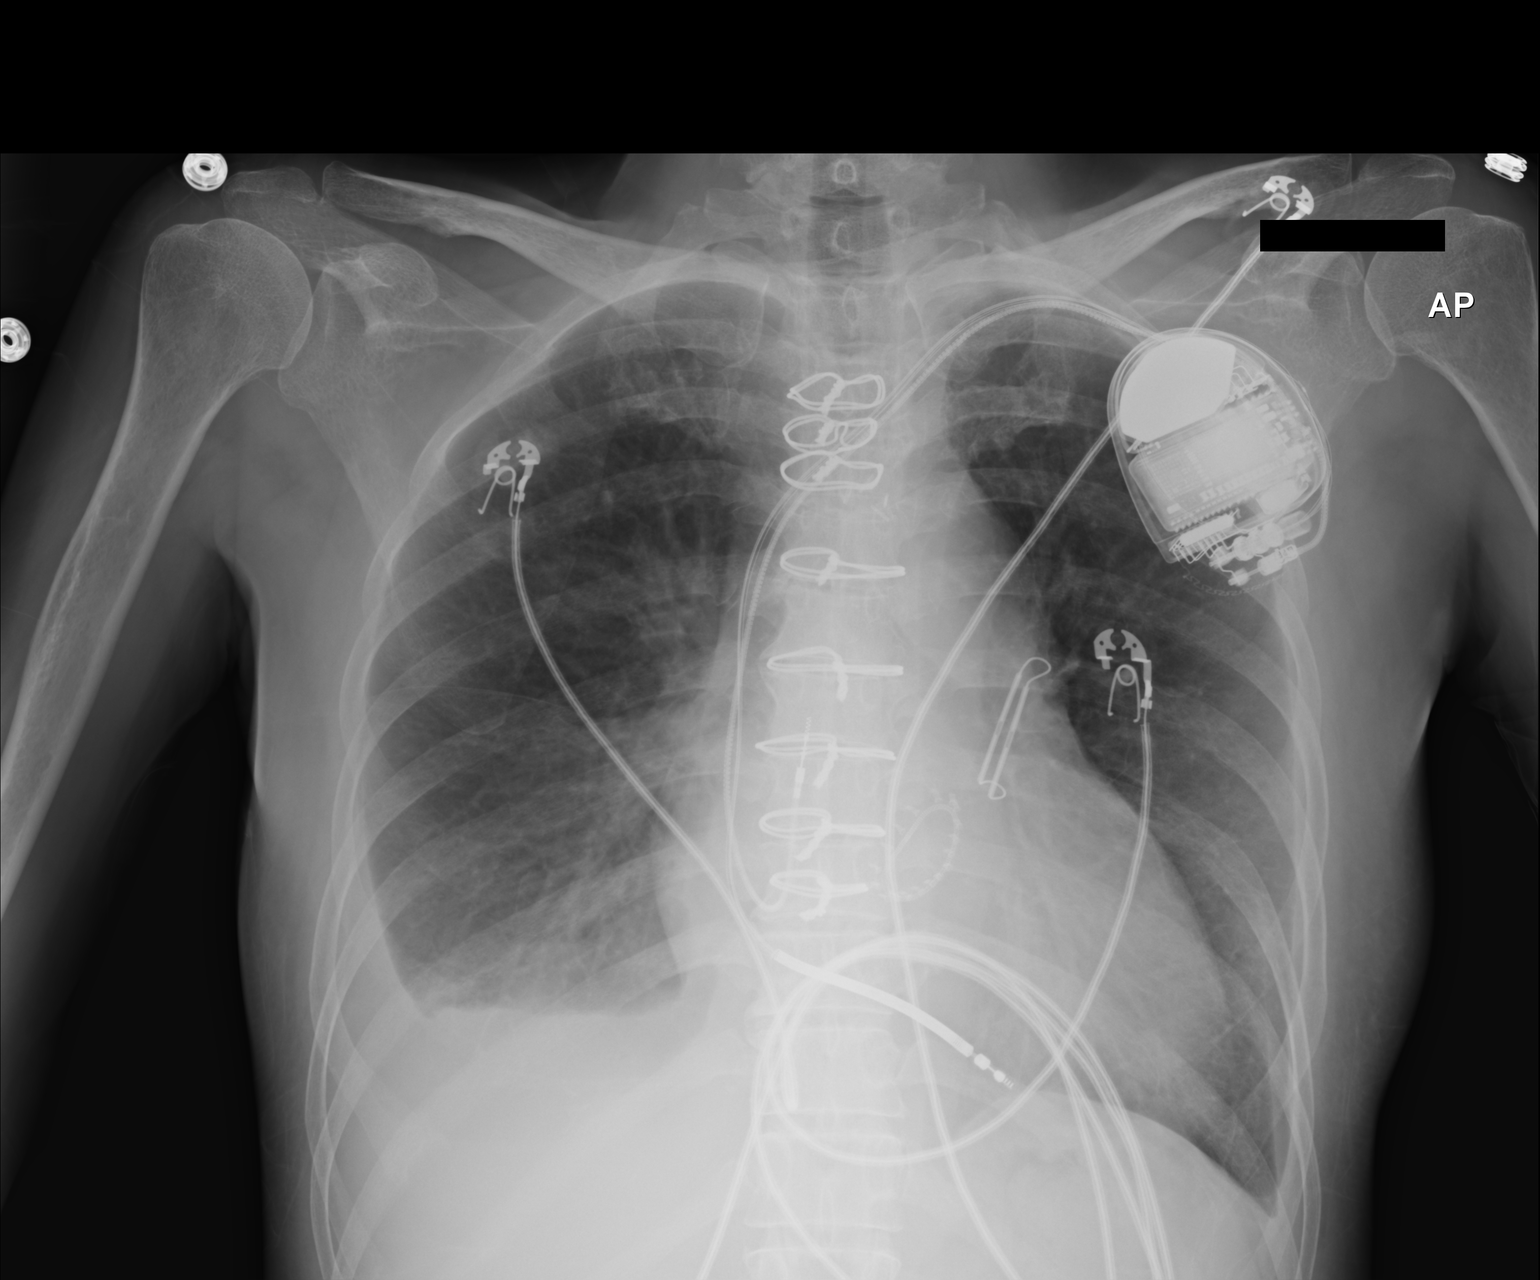

[1 of 1 positions shown; findings below may reference images not displayed]

FINDINGS: Virtually all the pleural fluid is been removed from the left hemi
thorax. The left lung is well aerated. Small to moderate effusion
persists on the right with mild right base volume loss. No other
change. No pneumothorax.
IMPRESSION: Left thoracentesis with essentially complete evacuation of the left
effusion. Left lung well aerated. No pneumothorax. No change small
to moderate right effusion.

## 2016-06-16 ENCOUNTER — Encounter: Payer: Self-pay | Admitting: Emergency Medicine

## 2016-06-16 ENCOUNTER — Ambulatory Visit (INDEPENDENT_AMBULATORY_CARE_PROVIDER_SITE_OTHER): Payer: BLUE CROSS/BLUE SHIELD | Admitting: Emergency Medicine

## 2016-06-16 DIAGNOSIS — R05 Cough: Secondary | ICD-10-CM

## 2016-06-16 DIAGNOSIS — R059 Cough, unspecified: Secondary | ICD-10-CM

## 2016-06-16 NOTE — Patient Instructions (Addendum)
Continue your current medications as you have been taking them  If your cough increases then we may need to restart Nexium. We will stay off of this for now.  Continue to practice safe swallowing practices.  Your Ct scan of the chest does not show any cause for cough in your lungs.  We will arrange for a bronchoscopy to inspect your airways and evaluate for any other cause of cough.  Follow with Dr Delton Coombes next available.

## 2016-06-16 NOTE — Assessment & Plan Note (Signed)
Most likely etiology is chronic aspiration of saliva and food / drink. She may have responded to PPI but unclear. CT scan reassuring. Believe we ned to perform airway inspection to r/o anatomical cause  Continue your current medications as you have been taking them  If your cough increases then we may need to restart Nexium. We will stay off of this for now.  Continue to practice safe swallowing practices.  Your Ct scan of the chest does not show any cause for cough in your lungs.  We will arrange for a bronchoscopy to inspect your airways and evaluate for any other cause of cough.  Follow with Dr Delton Coombes next available.

## 2016-06-16 NOTE — Progress Notes (Signed)
Subjective:    [name], female    DOB: 1961/02/18, 55 y.o.   MRN: [id number]  HPI 55 year old woman with a complicated past medical history including coronary artery disease and global hypokinesis by echocardiogram, orthostatic hypotension, paroxysmal atrial fibrillation. She underwent a mitral valve repair for mitral regurgitation in February 2017. Course complicated by a recurrent left pleural effusion for which she underwent left Pleurx catheter placement 10/21/15. 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 She was noted to have aphonia post operatively - was evaluated by ENT, she indicates that she had botox injection, does not relate CO2 laser.   She is not on an ACE inhibitor or an ARB.   She describes 2 kinds of cough > one kind that has been occurring since surgery and ENT eval that happens with eating / drinking / aspiration sx. She has had SLP eval and uses some techniques that help. She has some dysphagia, feels that her food may get stuck, doesn;lt really perceive reflux at these times.   The cough that preceded her surgery can happen at any time. Non-productive initially, now with some thick mucous hard to clear. Happens any time, often when talking. Can wake her from sleep, happens freq when she is supine. She has been treated empirically w allergy shots, allergy regimen without improvement.   Methacholine challenge was performed 10/14/15 that I reviewed >> no airways hyperresponsiveness but there was some evidence mixed disease, possible mild obstruction.   She has occasional reflux sx but certainly not daily.   ROV 06/16/16 -- This is a follow-up visit for history of chronic cough, possible obstructive lung disease on spirometry, history of chronic pleural effusion with Pleurx catheter, vocal cord lesion, and possible dysphagia all contributing  to the cough. Last time I statrted empiric nexium that she  took for 2 weeks, continued safe swallowing techniques. CT scan chest was reviewed, shows bilateral stable pleural effusions with an indwelling Pleurx catheter on the left, an 11 mm precarinal lymph node that is stable compared with January 2017, mild bibasilar interstitial changes on any evidence of infiltrate. Remains very hoarse, has discussed surgery w Dr Lazarus SalinesWolicki but has decided to defer. Her cough is a bit less than last time although she still has paroxysms.                                             Review of Systems  Constitutional: Negative for fever and unexpected weight change.  HENT: Negative for congestion, dental problem, ear pain, nosebleeds, postnasal drip, rhinorrhea, sinus pressure, sneezing, sore throat and trouble swallowing.   Eyes: Negative for redness and itching.  Respiratory: Positive for cough, chest tightness and shortness of breath. Negative for wheezing.   Cardiovascular: Negative for palpitations and leg swelling.  Gastrointestinal: Negative for nausea and vomiting.  Genitourinary: Negative for dysuria.  Musculoskeletal: Negative for joint swelling.  Skin: Negative for rash.  Neurological: Negative for headaches.  Hematological: Does not bruise/bleed easily.  Psychiatric/Behavioral: Negative for dysphoric mood. The patient is not nervous/anxious.    Past Medical History:  Diagnosis Date  . AICD (automatic cardioverter/defibrillator) present   . Anemia   . Anxiety   . Aortic aneurysm (HCC)   . Arthritis   . Cardiac arrest (HCC)   . Chronic combined systolic  and diastolic CHF (congestive heart failure) (HCC) 07/03/2015   Grade 3 diastolic dysfunction.  LVEF 40-45% 05/2015.  Marland Kitchen Depression    SOME DEPRESSION (ON MEDS FOR 2 YRS) WHEN HER MOTHER PASSED  . Patent ductus arteriosus 07/16/2015  . 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 07/22/2015   26 mm Sorin Memo 3D ring annuloplasty with ligation of patent ductus arteriosus and clipping of  LA appendage  . Severe mitral regurgitation 07/03/2015  . Shortness of breath dyspnea      Family History  Problem Relation Age of Onset  . Hypertension Mother      Social History   Social History  . Marital status: Married    Spouse name: N/A  . Number of children: 2  . Years of education: N/A   Occupational History  . Seamtress    Social History Main Topics  . Smoking status: Never Smoker  . Smokeless tobacco: Never Used  . Alcohol use No  . Drug use: No  . Sexual activity: Not on file   Other Topics Concern  . Not on file   Social History Narrative  . No narrative on file     Allergies  Allergen Reactions  . Vicodin [Hydrocodone-Acetaminophen] Other (See Comments)    "she feels out of it", hallucinating  . Adhesive [Tape] Itching and Rash  . Cymbalta [Duloxetine Hcl] Nausea And Vomiting and Other (See Comments)    Excessive dizziness  . Oxycodone Nausea Only    Makes her feel strange   . Silicone Itching and Rash    TAPE ALLERGY/EKG STICKER ALLERGY, Please use "paper" tape only     Outpatient Medications Prior to Visit  Medication Sig Dispense Refill  . aspirin 81 MG tablet Take 81 mg by mouth daily.    . Droxidopa (NORTHERA) 200 MG CAPS Take 1 capsule by mouth 3 (three) times daily.    Marland Kitchen esomeprazole (NEXIUM) 40 MG capsule Take 1 capsule (40 mg total) by mouth daily at 12 noon. 30 capsule 1  . ivabradine (CORLANOR) 5 MG TABS tablet Take 1 tablet (5 mg total) by mouth 2 (two) times daily with a meal. 60 tablet 6  . midodrine (PROAMATINE) 10 MG tablet Take 1 tablet (10 mg total) by mouth 3 (three) times daily with meals. 90 tablet 3  . pregabalin (LYRICA) 25 MG capsule Take 25 mg by mouth as directed. Pt takes 25 mg bi-weekly     No facility-administered medications prior to visit.         Objective:   Physical Exam Vitals:   06/16/16 1549 06/16/16 1550  BP:  112/72  Pulse:  82  SpO2:  99%  Weight: 95 lb (43.1 kg)   Height: 5\' 1"  (1.549 m)     Gen: thin woman, in no distress,  normal affect  ENT: No lesions,  mouth clear, soft voice, weak cough,   Neck: No JVD, no TMG, no carotid bruits, mild insp stridor  Lungs: No use of accessory muscles, clear without rales or rhonchi, L pleurex in place  Cardiovascular: RRR, heart sounds normal, no murmur or gallops, no peripheral edema  Musculoskeletal: No deformities, no cyanosis or clubbing  Neuro: alert, non focal  Skin: Warm, no lesions or rashes     Assessment & Plan:  Cough Most likely etiology is chronic aspiration of saliva and food / drink. She may have responded to PPI but unclear. CT scan reassuring. Believe we ned to perform airway inspection to r/o  anatomical cause  Continue your current medications as you have been taking them  If your cough increases then we may need to restart Nexium. We will stay off of this for now.  Continue to practice safe swallowing practices.  Your Ct scan of the chest does not show any cause for cough in your lungs.  We will arrange for a bronchoscopy to inspect your airways and evaluate for any other cause of cough.  Follow with Dr Delton Coombes next available.       Levy Pupa, MD, PhD 06/16/2016, 4:32 PM [facility] [phone] or if no answer [phone]

## 2016-06-17 ENCOUNTER — Telehealth: Payer: Self-pay | Admitting: Emergency Medicine

## 2016-06-17 NOTE — Telephone Encounter (Signed)
Pt has been scheduled for a bronch per RB. It has been scheduled for 06/22/2016 at 8am at [facility]. RB is aware of this. lmtcb x1 for pt.

## 2016-06-21 NOTE — Telephone Encounter (Signed)
lmtcb x2 for pt. 

## 2016-06-22 ENCOUNTER — Ambulatory Visit (HOSPITAL_COMMUNITY)
Admission: RE | Admit: 2016-06-22 | Payer: BLUE CROSS/BLUE SHIELD | Source: Ambulatory Visit | Admitting: Emergency Medicine

## 2016-06-22 ENCOUNTER — Encounter (HOSPITAL_COMMUNITY): Admission: RE | Payer: Self-pay | Source: Ambulatory Visit

## 2016-06-22 ENCOUNTER — Inpatient Hospital Stay (HOSPITAL_COMMUNITY)
Admission: RE | Admit: 2016-06-22 | Discharge: 2016-06-22 | Disposition: A | Discharge status: Home / Self Care | Payer: BLUE CROSS/BLUE SHIELD | Source: Ambulatory Visit

## 2016-06-22 SURGERY — VIDEO BRONCHOSCOPY WITHOUT FLUORO
Anesthesia: Moderate Sedation | Laterality: Bilateral

## 2016-06-22 NOTE — Telephone Encounter (Signed)
Message will closed as pt's bronch appointment has passed.

## 2016-07-07 IMAGING — CR DG CHEST 2V
2 series · 2 of 2 positions shown · non-contrast
Comparison: September 11, 2015

CLINICAL DATA: Shortness of breath and chest pressure. History of
mitral regurgitation and congestive heart failure.

EXAM:
CHEST  2 VIEW

[w chest pa]
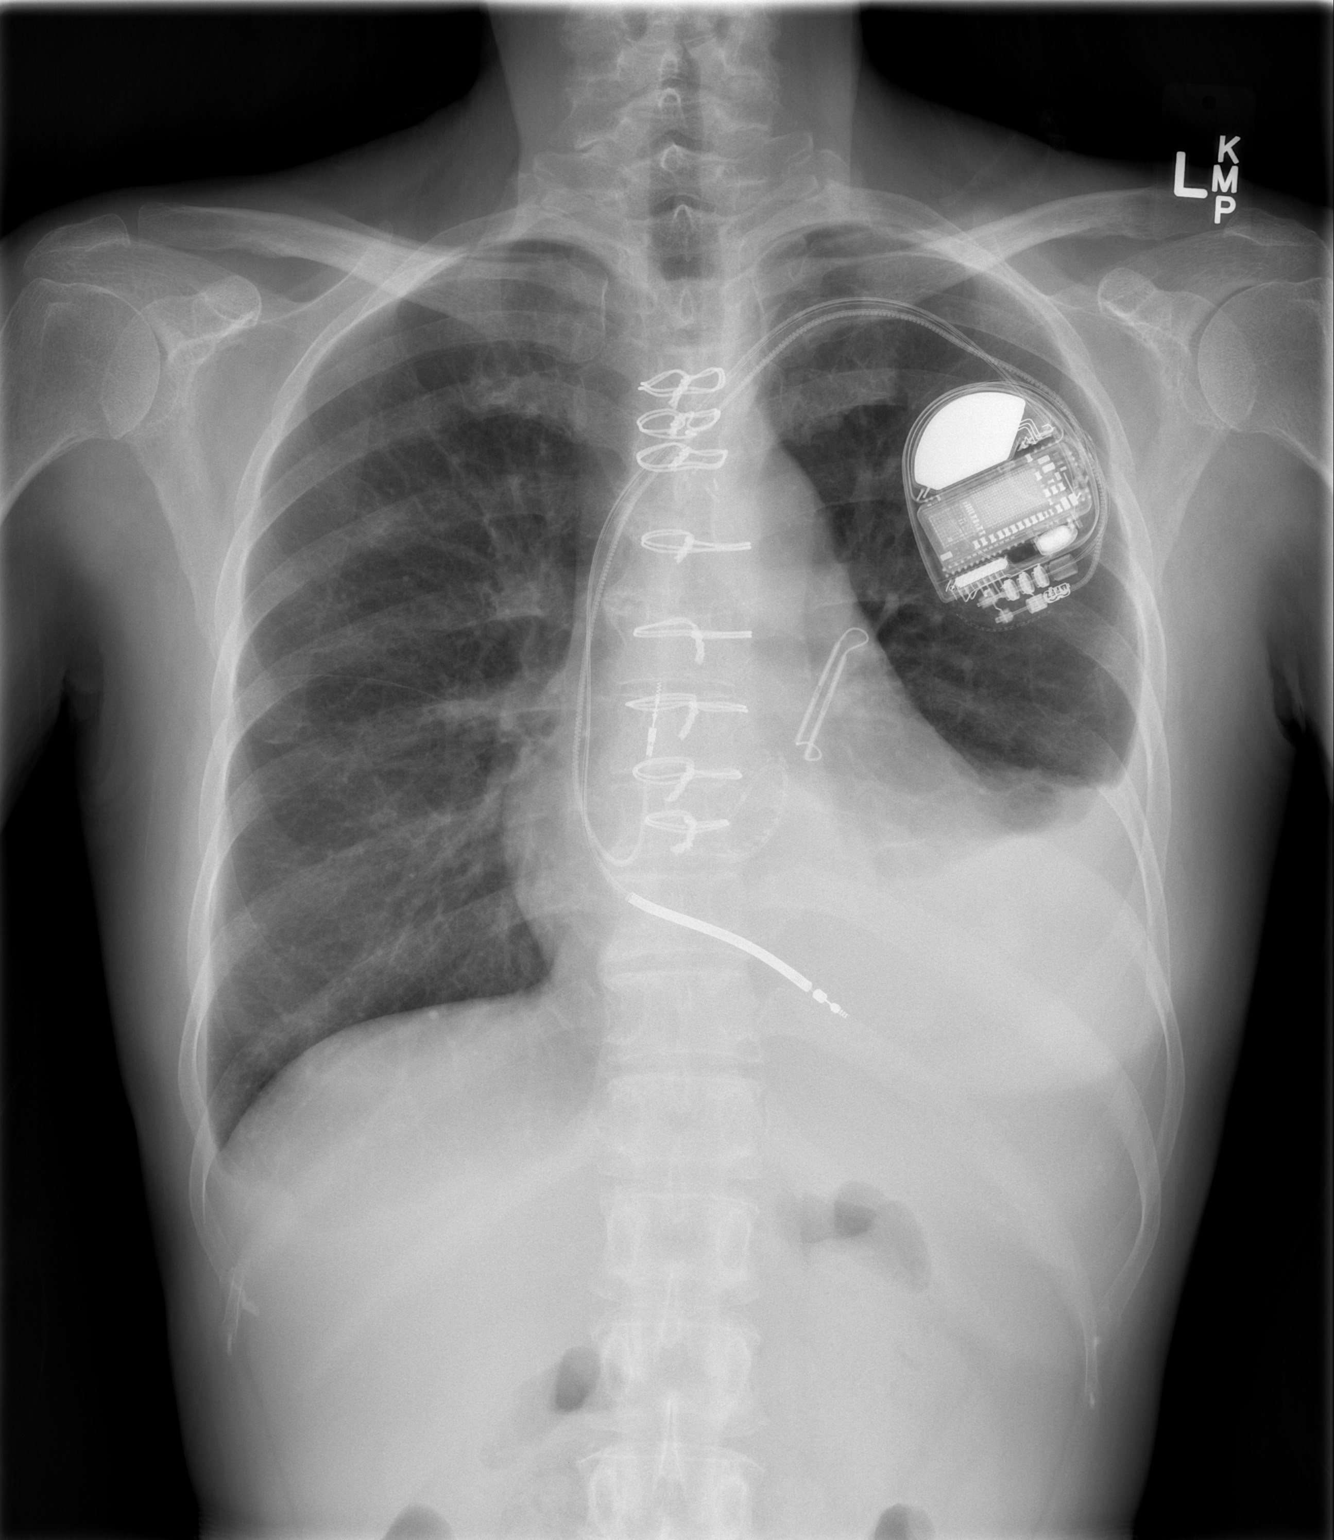

[w chest lat]
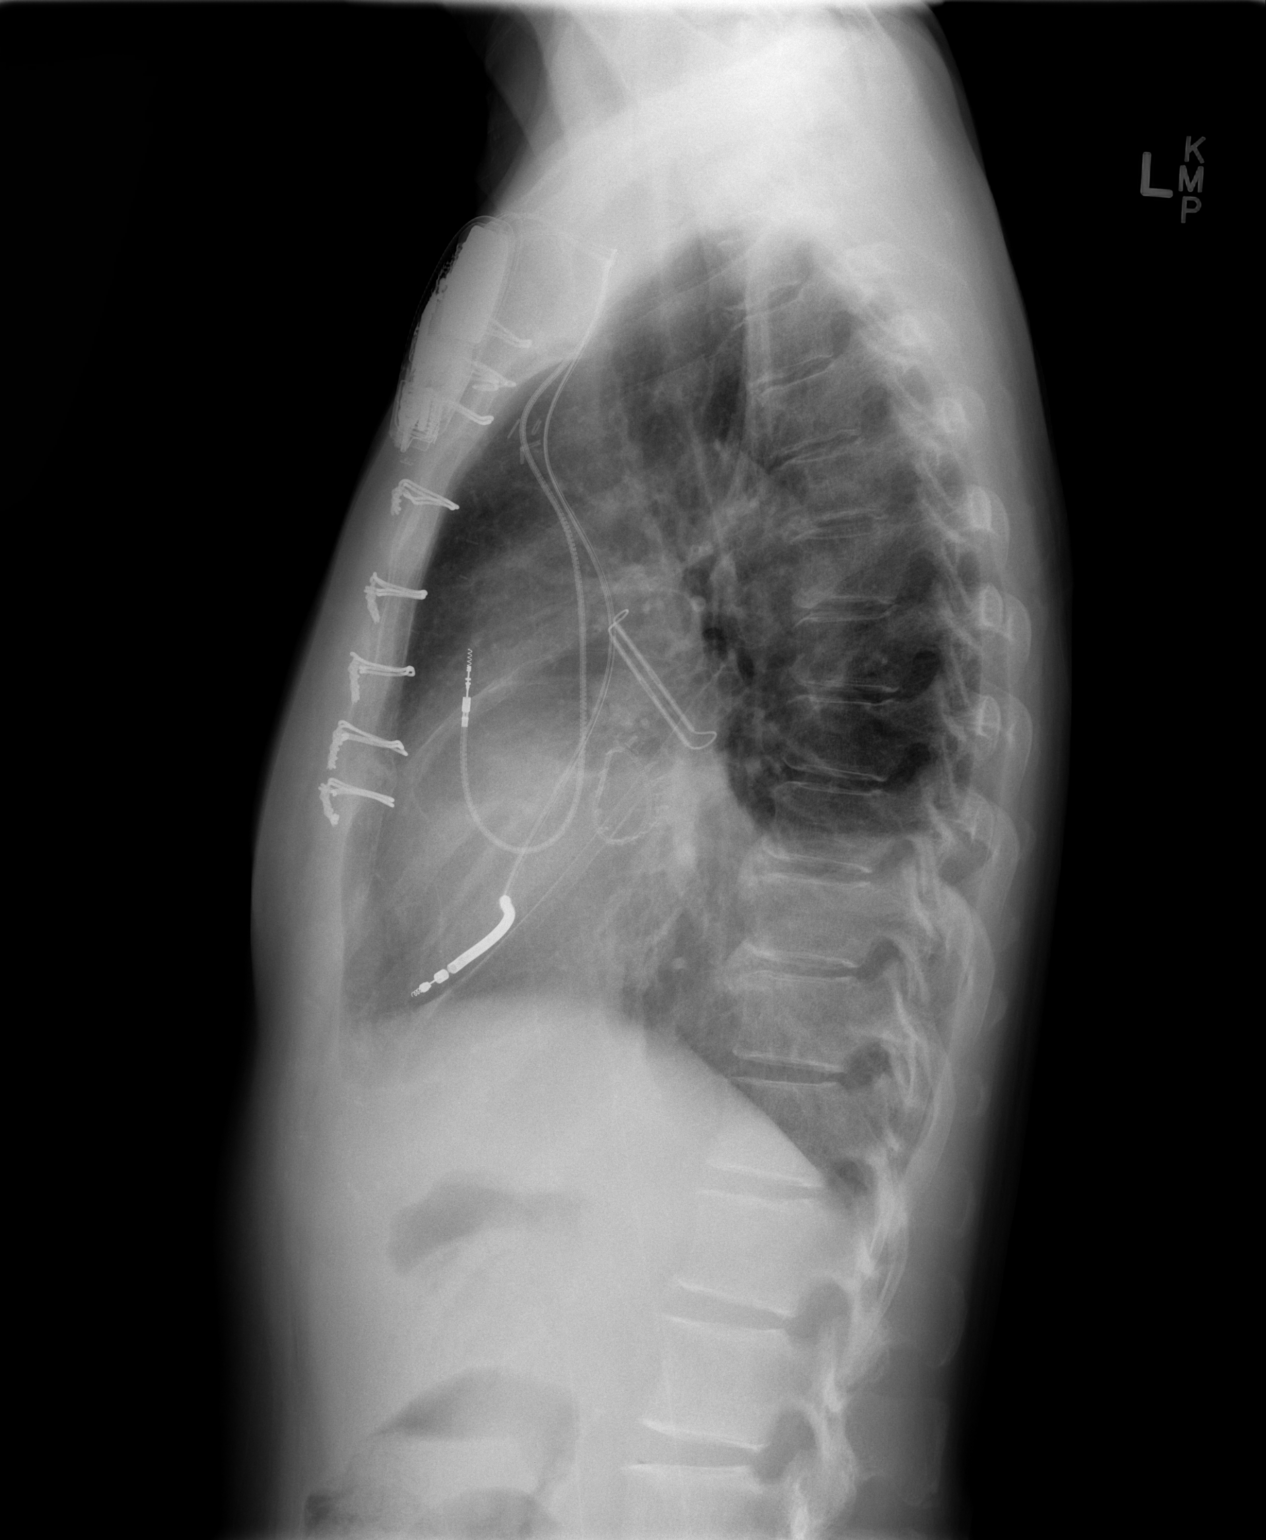

[2 of 2 positions shown; findings below may reference images not displayed]

FINDINGS: There is a persistent left pleural effusion with left base
atelectatic change. Right lung is clear. Heart is upper normal in
size with pulmonary vascularity within normal limits. There is a
pacemaker with lead tips attached to the right atrium and right
ventricle. There is a mitral valve replacement present as well as a
left atrial appendage clamp. No adenopathy. No bone lesions. No
pneumothorax.
IMPRESSION: Persistent left pleural effusion with left base atelectasis. Right
lung clear. No change in cardiac silhouette. No pneumothorax.

## 2016-07-27 ENCOUNTER — Telehealth: Payer: Self-pay | Admitting: Cardiovascular Disease

## 2016-07-27 ENCOUNTER — Other Ambulatory Visit (HOSPITAL_COMMUNITY): Payer: Self-pay | Admitting: *Deleted

## 2016-07-27 MED ORDER — IVABRADINE HCL 5 MG PO TABS: 5.0000 mg | ORAL_TABLET | Freq: Two times a day (BID) | ORAL | 6 refills | Status: DC

## 2016-07-27 MED ORDER — IVABRADINE HCL 5 MG PO TABS: 5.0000 mg | ORAL_TABLET | Freq: Two times a day (BID) | ORAL | 3 refills | Status: DC

## 2016-07-27 NOTE — Telephone Encounter (Signed)
New message   *STAT* If patient is at the pharmacy, call can be transferred to refill team.   1. Which medications need to be refilled? (please list name of each medication and dose if known) colanor 5mg  2. Which pharmacy/location (including street and city if local pharmacy) is medication to be sent to? [facility]   Phone number [phone]    fax [phone]     [phone], [place], [place] [address]   3. Do they need a 30 day or 90 day supply? Emergency supply while it is delivered pt has been off meds for two days because it has not been delivered yet

## 2016-07-27 NOTE — Telephone Encounter (Signed)
Follow up  Pt voiced wanting nurse to give her a call.  Please f/u

## 2016-07-27 NOTE — Telephone Encounter (Signed)
Spoke to patient. She needed short term supply of her corlanor (forgot bottle at home and is visiting relative in [place]) [facility] to [facility] [place] at pt request. Aware to call if further needs.

## 2016-08-19 ENCOUNTER — Telehealth: Payer: Self-pay | Admitting: Cardiology

## 2016-08-19 ENCOUNTER — Ambulatory Visit (INDEPENDENT_AMBULATORY_CARE_PROVIDER_SITE_OTHER): Payer: BLUE CROSS/BLUE SHIELD | Admitting: *Deleted

## 2016-08-19 DIAGNOSIS — I472 Ventricular tachycardia, unspecified: Secondary | ICD-10-CM

## 2016-08-19 NOTE — Progress Notes (Signed)
Remote ICD transmission.   

## 2016-08-19 NOTE — Telephone Encounter (Signed)
LMOVM reminding pt to send remote transmission.   

## 2016-08-25 ENCOUNTER — Encounter: Payer: Self-pay | Admitting: Cardiology

## 2016-08-29 LAB — CUP PACEART REMOTE DEVICE CHECK
Brady Statistic AP VP Percent: 0 %
Brady Statistic AP VS Percent: 0.22 %
Brady Statistic AS VP Percent: 0.03 %
Brady Statistic AS VS Percent: 99.75 %
Brady Statistic RV Percent Paced: 0.03 %
Date Time Interrogation Session: 20180201200829
HIGH POWER IMPEDANCE MEASURED VALUE: 51 Ohm
Implantable Lead Implant Date: 20170127
Implantable Lead Model: 5076
Lead Channel Impedance Value: 304 Ohm
Lead Channel Impedance Value: 399 Ohm
Lead Channel Pacing Threshold Amplitude: 0.75 V
Lead Channel Pacing Threshold Amplitude: 0.75 V
Lead Channel Pacing Threshold Pulse Width: 0.4 ms
Lead Channel Sensing Intrinsic Amplitude: 7.25 mV
Lead Channel Sensing Intrinsic Amplitude: 7.25 mV
Lead Channel Setting Pacing Amplitude: 1.5 V
Lead Channel Setting Pacing Amplitude: 2 V
Lead Channel Setting Pacing Pulse Width: 0.4 ms
Lead Channel Setting Sensing Sensitivity: 0.3 mV
MDC IDC LEAD IMPLANT DT: 20170127
MDC IDC LEAD LOCATION: 753859
MDC IDC LEAD LOCATION: 753860
MDC IDC MSMT BATTERY REMAINING LONGEVITY: 128 mo
MDC IDC MSMT BATTERY VOLTAGE: 3.02 V
MDC IDC MSMT LEADCHNL RA PACING THRESHOLD PULSEWIDTH: 0.4 ms
MDC IDC MSMT LEADCHNL RA SENSING INTR AMPL: 0.75 mV
MDC IDC MSMT LEADCHNL RA SENSING INTR AMPL: 0.75 mV
MDC IDC MSMT LEADCHNL RV IMPEDANCE VALUE: 399 Ohm
MDC IDC PG IMPLANT DT: 20170127
MDC IDC STAT BRADY RA PERCENT PACED: 0.22 %

## 2016-09-13 ENCOUNTER — Other Ambulatory Visit (HOSPITAL_COMMUNITY): Payer: Self-pay | Admitting: Cardiology

## 2016-09-13 MED ORDER — MIDODRINE HCL 10 MG PO TABS: 10.0000 mg | ORAL_TABLET | Freq: Three times a day (TID) | ORAL | 3 refills | Status: DC

## 2016-10-13 ENCOUNTER — Other Ambulatory Visit (HOSPITAL_COMMUNITY): Payer: Self-pay | Admitting: Internal Medicine

## 2016-11-18 ENCOUNTER — Ambulatory Visit (INDEPENDENT_AMBULATORY_CARE_PROVIDER_SITE_OTHER): Payer: BLUE CROSS/BLUE SHIELD | Admitting: *Deleted

## 2016-11-18 DIAGNOSIS — I472 Ventricular tachycardia, unspecified: Secondary | ICD-10-CM

## 2016-11-18 NOTE — Progress Notes (Signed)
Remote ICD transmission.   

## 2016-11-19 LAB — CUP PACEART REMOTE DEVICE CHECK
Brady Statistic AP VS Percent: 0.03 %
Brady Statistic AS VP Percent: 0.03 %
Brady Statistic RV Percent Paced: 0.03 %
Date Time Interrogation Session: 20180502025751
HIGH POWER IMPEDANCE MEASURED VALUE: 45 Ohm
Implantable Lead Implant Date: 20170127
Implantable Lead Location: 753860
Implantable Lead Model: 5076
Lead Channel Impedance Value: 418 Ohm
Lead Channel Pacing Threshold Amplitude: 0.75 V
Lead Channel Pacing Threshold Pulse Width: 0.4 ms
Lead Channel Sensing Intrinsic Amplitude: 0.75 mV
Lead Channel Sensing Intrinsic Amplitude: 6.25 mV
Lead Channel Setting Pacing Amplitude: 2 V
Lead Channel Setting Pacing Pulse Width: 0.4 ms
MDC IDC LEAD IMPLANT DT: 20170127
MDC IDC LEAD LOCATION: 753859
MDC IDC MSMT BATTERY REMAINING LONGEVITY: 125 mo
MDC IDC MSMT BATTERY VOLTAGE: 3.01 V
MDC IDC MSMT LEADCHNL RA PACING THRESHOLD AMPLITUDE: 0.75 V
MDC IDC MSMT LEADCHNL RA SENSING INTR AMPL: 0.75 mV
MDC IDC MSMT LEADCHNL RV IMPEDANCE VALUE: 285 Ohm
MDC IDC MSMT LEADCHNL RV IMPEDANCE VALUE: 399 Ohm
MDC IDC MSMT LEADCHNL RV PACING THRESHOLD PULSEWIDTH: 0.4 ms
MDC IDC MSMT LEADCHNL RV SENSING INTR AMPL: 6.25 mV
MDC IDC PG IMPLANT DT: 20170127
MDC IDC SET LEADCHNL RA PACING AMPLITUDE: 1.5 V
MDC IDC SET LEADCHNL RV SENSING SENSITIVITY: 0.3 mV
MDC IDC STAT BRADY AP VP PERCENT: 0 %
MDC IDC STAT BRADY AS VS PERCENT: 99.94 %
MDC IDC STAT BRADY RA PERCENT PACED: 0.03 %

## 2016-11-22 NOTE — Progress Notes (Signed)
Cardiology Office Note  all Date:  11/23/2016   ID:  Diane [name], DOB 31-Jul-1960, MRN [id number]  PCP:  Darrow Bussing, MD  Cardiologist:   Chilton Si, MD  Electrophysiologist: Dr. Berton Mount  Chief Complaint  Patient presents with  . Follow-up    no chest pain, has severe shortness of breath, has edema in legs and feet, having sharp pain in legs, has lightheaded and dizziness     [name] is a 56 y.o. female with chronic systolic and diastolic heart failure, LVEF 35-40%, autonomic failure, s/p mitral valve repair, and VT/long QT s/p ICD here for follow up.  History of Present Illness Diane [name] initially presented 05/2015 with recurrent syncope and progressive shortness of breath.  She previously had an exercise Myoview 07/2013 that was negative for ischemia.  She underwent echocardiography 05/2015 that revealed severe mitral regurgitation and a reduced LVEF (45-50%). The MR was due to tethering of the posterior leaflet. She was also noted to have grade 3 diastolic dysfunction with mid basal to mid inferolateral wall hypokinesis.  Diane [name] underwent repair of her mitral valve with a 26 mm Sorin Memo 3D annuloplasty ring on 07/22/15. Intra-operatively she was noted to have a PDA with left to right shunting that was ligated in the OR. The left atrium was also clipped. Her post operative course was initially unremarkable and she was discharged on post-op day 5. On 1/17 she suffered a cardiac arrest at home and was resuscitated by EMS. The initial rhythm was unclear but she had VT upon arrival in the ED, which was followed by atrial fibrillation. She was treated with hypothermia and has no residual cognitive deficits. During that hospitalization she underwent cardiac MRI with LVEF 45% with hypokinesis of the distal septum and apex. There was diffuse, delayed enhancement in the subendocardium and mid epicardial region, most prominent in the inferior wall.  This was concerning for myocarditis or infiltrative cardiomyopathy. Her EKG showed QT prolongation and she had an ICD placed for secondary prevention. Her hospitalization was delayed by severe orthostatic hypotension and recurrent syncope requiring high doses of florinef and midodrine. She was discharged home but was readmitted with volume overload on 2/16. Echo showed LVEF 35-40% with diffuse hypokinesis and her mitral valve was functioning well.  Diane [name] wore an event monitor on 12/22/15 that did not reveal any abnormalities.  Given that her only episode of atrial fibrillation occurred perioperatively and her left atrial appendage has been clipped, warfarin was discontinued.  She had cortisol levels repeated 01/22/16, which were normal.  She also had testing for acetylcholine receptor antibodies and striated muscle antibodies which were both unremarkable.  Since her initial hospitalization she has struggled with hypotension and recurrent L pleural effusion.  She had a pleurex catheter placed in the L chest.  She currently drains three times weekly.  Diane [name] was evaluated by Dr. Erenest Rasher and underwent Tilt Table testing 05/2016 that revealed autonomic failure.  Her BP dropped to 55/32.  She was started on mestanon but has been unable to tolerate it due to diarrhea.  She was also referred for a genetic evaluation given her long QT and SCD.  She has not yet been evaluated by this clinic.  Diane [name] spent the winter in [place] with her sister.  Her sister pushed her to start walking and she is now able to do much more.  She was feeling depressed but her mood is now much better.  She has  a "will to live."  She continues to have episodes of dizziness but less frequently.  She gets lightheded when she exerts herself.  She had two syncopal episodes while in [place].  She visited her brother in [place] and had more lightheadedness and dizziness while there.  She is now able to walk  without a cane.  Since returning from [place] she has noted increased shortness of breath and edema.  She started back taking lasix 20 mg daily, which has helped.  She denies orthopnea.  Her appetite is much better but she continues to complain of diarrhea followed by constipation.  Past Medical History:  Diagnosis Date  . AICD (automatic cardioverter/defibrillator) present   . Anemia   . Anxiety   . Aortic aneurysm (HCC)   . Arthritis   . Cardiac arrest (HCC)   . Chronic combined systolic and diastolic CHF (congestive heart failure) (HCC) 07/03/2015   Grade 3 diastolic dysfunction.  LVEF 40-45% 05/2015.  Marland Kitchen Depression    SOME DEPRESSION (ON MEDS FOR 2 YRS) WHEN HER MOTHER PASSED  . Patent ductus arteriosus 07/16/2015  . 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 07/22/2015   26 mm Sorin Memo 3D ring annuloplasty with ligation of patent ductus arteriosus and clipping of LA appendage  . Severe mitral regurgitation 07/03/2015  . Shortness of breath dyspnea     Past Surgical History:  Procedure Laterality Date  . CARDIAC CATHETERIZATION N/A 07/08/2015   Procedure: Right/Left Heart Cath and Coronary Angiography;  Surgeon: Marykay Lex, MD;  Location: [facility] INVASIVE CV LAB;  Service: Cardiovascular;  Laterality: N/A;  . CHEST TUBE INSERTION Left 10/21/2015   Procedure: INSERTION left PLEURAL DRAINAGE CATHETER;  Surgeon: Purcell Nails, MD;  Location: [facility] OR;  Service: Thoracic;  Laterality: Left;  . CLIPPING OF ATRIAL APPENDAGE  07/22/2015   Procedure: CLIPPING OF LEFT ATRIAL APPENDAGE;  Surgeon: Purcell Nails, MD;  Location: [facility] OR;  Service: Open Heart Surgery;;  35 [address] Pro  . COLONOSCOPY    . EP IMPLANTABLE DEVICE N/A 08/15/2015   Procedure: ICD Implant;  Surgeon: Will Jorja Loa, MD;  Location: [facility] INVASIVE CV LAB;  Service: Cardiovascular;  Laterality: N/A;  . MICROLARYNGOSCOPY WITH CO2 LASER AND EXCISION OF VOCAL CORD LESION N/A 11/04/2015   Procedure:  MICROLARYNGOSCOPY, RADIESSE VOCAL CORD AUGMENTATION;  Surgeon: Flo Shanks, MD;  Location: [facility] OR;  Service: ENT;  Laterality: N/A;  . MITRAL VALVE REPAIR N/A 07/22/2015   Procedure: MITRAL VALVE REPAIR;  Surgeon: Purcell Nails, MD;  Location: [facility] OR;  Service: Open Heart Surgery;  Laterality: N/A;  26 Sorin Memo 3D  . OVARIAN CYST REMOVAL    . PATENT DUCTUS ARTERIOUS REPAIR N/A 07/22/2015   Procedure: PATENT DUCTUS ARTERIOSUS (PDA) ligation;  Surgeon: Purcell Nails, MD;  Location: [facility] OR;  Service: Open Heart Surgery;  Laterality: N/A;  . TEE WITHOUT CARDIOVERSION N/A 07/07/2015   Procedure: TRANSESOPHAGEAL ECHOCARDIOGRAM (TEE);  Surgeon: Chilton Si, MD;  Location[facility] ENDOSCOPY;  Service: Cardiovascular;  Laterality: N/A;  . TEE WITHOUT CARDIOVERSION N/A 07/22/2015   Procedure: TRANSESOPHAGEAL ECHOCARDIOGRAM (TEE);  Surgeon: Purcell Nails, MD;  Location: [facility] OR;  Service: Open Heart Surgery;  Laterality: N/A;     Current Outpatient Prescriptions  Medication Sig Dispense Refill  . aspirin 81 MG tablet Take 81 mg by mouth daily.    Retia Passe 5 MG TABS tablet TAKE 1 TABLET (5 MG TOTAL) BY MOUTH 2 (TWO) TIMES DAILY WITH A MEAL.  60 tablet 3  . Droxidopa (NORTHERA) 200 MG CAPS Take 1 capsule by mouth 3 (three) times daily.    Marland Kitchen esomeprazole (NEXIUM) 40 MG capsule Take 1 capsule (40 mg total) by mouth daily at 12 noon. 30 capsule 1  . furosemide (LASIX) 20 MG tablet Take 20 mg by mouth daily as needed.    . midodrine (PROAMATINE) 10 MG tablet Take 1 tablet (10 mg total) by mouth 3 (three) times daily with meals. 270 tablet 3   No current facility-administered medications for this visit.     Allergies:   Vicodin [hydrocodone-acetaminophen]; Other; Adhesive [tape]; Cymbalta [duloxetine hcl]; Oxycodone; and Silicone    Social History:  The patient  reports that she has never smoked. She has never used smokeless tobacco. She reports that she does not drink alcohol or use drugs.   Family  History:  The patient's family history includes Diabetes in her father; Heart disease in her mother; Hypertension in her mother; Stomach cancer in her father.    ROS:  Please see the history of present illness.  Otherwise, review of systems are positive for feeling constantly cold   All other systems are reviewed and negative.    PHYSICAL EXAM: VS:  BP 106/84   Pulse 78   Ht 5' (1.524 m)   Wt 41.7 kg (92 lb)   BMI 17.97 kg/m  , BMI Body mass index is 17.97 kg/m. GENERAL:  Chronically ill-appearing.  Voice is getting stronger. HEENT:  Pupils equal round and reactive, fundi not visualized, oral mucosa unremarkable.  Hoarse voice NECK:  No jugular venous distention, waveform within normal limits, carotid upstroke brisk and symmetric, no bruits\ LYMPHATICS:  No cervical adenopathy LUNGS:  Clear to auscultation bilaterally. HEART:  RRR.  PMI not displaced or sustained,S1 and S2 within normal limits, no S3, no S4, no clicks, no rubs, no murmurs Chest: L pleurex catheter.   ABD:  Flat, positive bowel sounds normal in frequency in pitch, no bruits, no rebound, no guarding, no midline pulsatile mass, no hepatomegaly, no splenomegaly EXT:  2 plus pulses throughout, trace edema, no cyanosis no clubbing SKIN:  No rashes no nodules NEURO:  Cranial nerves II through XII grossly intact, motor grossly intact throughout PSYCH:  Cognitively intact, oriented to person place and time  EKG:  EKG is not ordered today.   12/06/15: Sinus rhythm. Rate 78 bpm. Left bundle-branch block.  Left ventricular hypertrophy. Left anterior fascicular block.  11/23/16: Sinus rhythm.  Rate 78 bpm.  LAFB.  QTC 497  Echo 06/04/15: Study Conclusions  - Left ventricle: The cavity size was normal. Wall thickness was increased in a pattern of mild LVH. Systolic function was mildly reduced. The estimated ejection fraction was in the range of 45% to 50%. Abnormal GLPSS at -11%, with inferior and inferolateral strain  abnormality. Basal to mid inferolateral wall hypokinesis. Doppler parameters are consistent with restrictive left ventricular relaxation (grade 3 diastolic dysfunction). The E/A ratio is >2.5. The E/e&' ratio is >20, suggesting markedly elevated LV filling pressure. - Mitral valve: Mildly thickened leaflets with tethering of the posterior leaflet. There is severe, posteriorly directed regurgitation which hugs the atrial free wall. Reversal of flow is seen in the pulmonary vein. - Left atrium: Severely dilated at 52 ml/m2. - Right atrium: The atrium was mildly dilated. - Tricuspid valve: There was mild regurgitation. - Pulmonary arteries: PA peak pressure: 41 mm Hg (S). - Inferior vena cava: The vessel was normal in size. The respirophasic diameter changes  were in the normal range (>= 50%), consistent with normal central venous pressure. - Pericardium, extracardiac: A trivial pericardial effusion was identified posterior to the heart. Features were not consistent with tamponade physiology.  Impressions:  - LVEF 45-50%, mild LVH, abnormal GLPSS at -11% with inferolateral strain and wall motion abnormalities. There is tethering of the posterior mitral leaflet and severe posteriorly directed mitral regurgitation. Restrictive diastolic dysfunction with likely elevated LV filling pressures. Severely dilated LA at 52 ml/m2, mild TR, RVSP elevated at 41 mmHg (consistent with mild pulmonary venous hypertension), trivial posterior pericardial effusion without tamponade physiology.  PFT 11/05/14:  FEV1  1.9 L FVC : 2.4 L FEV1 and FVC are reduced, but the FEV1/FVC ratio is normal.  Following adminitration of bronchodilator there was no significant response.  Consistent with minimal obstructive airway disease.    LHC 07/08/15:  Severe mitral regurgitation with large V waves and 4+ MR  There is mild to moderate left ventricular systolic dysfunction. With  cardiac output/index moderate-severely reduced.  Angiographically normal coronary arteries  Cardiac MRI 08/14/15: IMPRESSION: 1) Mildly asymmetric septal hypertrophy 15 mm. Distal septal apical hypokinesis EF 45%  2) Diffuse delayed gadolinium uptake in the subendocardial and mid epicardial areas especially marked in the inferior wall  3) S/P mitral valve repair with no residual MR and annuloplasty ring  4) Atri-Clip of the LAA with no flow  5) No residual PDA flow  6) Mild LAE  7) Small pericardial effusion  8) Large left pleural effusion  Note delayed gadolinium findings may be consistent with myocarditis vs infiltrative cardiomyopathy  Echo 09/04/15: Study Conclusions  - Left ventricle: The cavity size was normal. There was mild concentric hypertrophy. Systolic function was moderately reduced. The estimated ejection fraction was in the range of 35% to 40%. Diffuse hypokinesis. - Aortic valve: Trileaflet; mildly thickened, mildly calcified leaflets. There was trivial regurgitation. - Mitral valve: S/P mitral valve repair with annuloplasty ring. The findings are consistent with mild stenosis. Valve area by continuity equation (using LVOT flow): 1.34 cm^2. - Tricuspid valve: There was mild regurgitation. - Pulmonary arteries: PA peak pressure: 39 mm Hg (S). - Pericardium, extracardiac: There was a left pleural effusion.  Impressions:  - The right ventricular systolic pressure was increased consistent with mild pulmonary hypertension.   Recent Labs: No results found for requested labs within last 8760 hours.    Lipid Panel    Component Value Date/Time   CHOL 181 09/04/2015 0000      Wt Readings from Last 3 Encounters:  11/23/16 41.7 kg (92 lb)  06/16/16 43.1 kg (95 lb)  05/10/16 44.5 kg (98 lb)      ASSESSMENT AND PLAN:  # Chronic systolic and diastolic heart failure:  # Autonomic failure: Diane [name] has been doing much better  but still has episodes of syncope.  She has mild edema and her Optival showed increased fluid levels on her remote interrogation.  Agree with continuing lasix until her breathing and edema are back at baseline.  She will see Dr. Gala Romney later this week.  She seems strong enough to proceed with RV endomyocardial biopsy at this time.  Given that she has been to [facility] for syncope, it would be reasonable to refer her there.  Continued to encourage increased oral intake.  Continue ivabradine and northera.  BP will not tolerate beta blocker or ACE-I/ARB/ARNI.  Check CMP, CBC, TSH.  # Mitral regurgitation s/p mitral valve repair:  Annuloplasty ring was stable on 08/2015. Continue aspirin 81mg   daily.    # Paroxysmal atrial fibrillation: Diane [name] remains in sinus rhythm.  LA appendage was clipped in surgery.  There was no flow in the LAA on cardiac MRI.  She had an episode of atrial fibrillation after being shocked for VT/VF.  Seven day event monitor did not reveal any atrial fibrillation.  Therefore warfarin was discontinued.   # Pleural effusion: Drainage is up to 200 mL three times weekly.  It previously decreased to 75mL.  It was up to 400 mL when she was more fluid overloaded.  We will check her albumin levels given this chronic fluid losses.  # ICD: Managed by Dr. Elberta Fortis.  Optivol indicated increasing volume overload and lasix was appropriately restarted.    Current medicines are reviewed at length with the patient today.  The patient does not have concerns regarding medicines.  The following changes have been made:  none  Labs/ tests ordered today include:   Orders Placed This Encounter  Procedures  . CBC with Differential/Platelet  . TSH  . Basic metabolic panel  . EKG 12-Lead    Disposition:   FU with Ryu Cerreta C. Duke Salvia, MD in 2 months.   Signed, Chilton Si, MD  11/23/2016 1:50 PM    [facility]

## 2016-11-23 ENCOUNTER — Ambulatory Visit (INDEPENDENT_AMBULATORY_CARE_PROVIDER_SITE_OTHER): Payer: BLUE CROSS/BLUE SHIELD | Admitting: Cardiovascular Disease

## 2016-11-23 ENCOUNTER — Other Ambulatory Visit: Payer: Self-pay | Admitting: Cardiovascular Disease

## 2016-11-23 ENCOUNTER — Encounter: Payer: Self-pay | Admitting: Cardiovascular Disease

## 2016-11-23 VITALS — BP 106/84 | HR 78 | Ht 60.0 in | Wt 92.0 lb

## 2016-11-23 DIAGNOSIS — Z79899 Other long term (current) drug therapy: Secondary | ICD-10-CM | POA: Diagnosis not present

## 2016-11-23 DIAGNOSIS — I5043 Acute on chronic combined systolic (congestive) and diastolic (congestive) heart failure: Secondary | ICD-10-CM

## 2016-11-23 DIAGNOSIS — I480 Paroxysmal atrial fibrillation: Secondary | ICD-10-CM

## 2016-11-23 DIAGNOSIS — Z9889 Other specified postprocedural states: Secondary | ICD-10-CM | POA: Diagnosis not present

## 2016-11-23 DIAGNOSIS — R0602 Shortness of breath: Secondary | ICD-10-CM | POA: Diagnosis not present

## 2016-11-23 LAB — BASIC METABOLIC PANEL
BUN: 18 mg/dL (ref 7–25)
CHLORIDE: 99 mmol/L (ref 98–110)
CO2: 29 mmol/L (ref 20–31)
CREATININE: 0.72 mg/dL (ref 0.50–1.05)
Calcium: 9.2 mg/dL (ref 8.6–10.4)
Glucose, Bld: 80 mg/dL (ref 65–99)
POTASSIUM: 4.2 mmol/L (ref 3.5–5.3)
Sodium: 136 mmol/L (ref 135–146)

## 2016-11-23 LAB — HEPATIC FUNCTION PANEL
ALT: 28 U/L (ref 6–29)
AST: 42 U/L — ABNORMAL HIGH (ref 10–35)
Albumin: 4 g/dL (ref 3.6–5.1)
Alkaline Phosphatase: 73 U/L (ref 33–130)
BILIRUBIN DIRECT: 0.2 mg/dL (ref <=0.2)
BILIRUBIN INDIRECT: 0.6 mg/dL (ref 0.2–1.2)
BILIRUBIN TOTAL: 0.8 mg/dL (ref 0.2–1.2)
Total Protein: 7.2 g/dL (ref 6.1–8.1)

## 2016-11-23 LAB — CBC WITH DIFFERENTIAL/PLATELET
BASOS ABS: 92 {cells}/uL (ref 0–200)
Basophils Relative: 2 %
EOS ABS: 46 {cells}/uL (ref 15–500)
Eosinophils Relative: 1 %
HCT: 43.4 % (ref 35.0–45.0)
HEMOGLOBIN: 13.9 g/dL (ref 11.7–15.5)
LYMPHS ABS: 1426 {cells}/uL (ref 850–3900)
Lymphocytes Relative: 31 %
MCH: 31.4 pg (ref 27.0–33.0)
MCHC: 32 g/dL (ref 32.0–36.0)
MCV: 98 fL (ref 80.0–100.0)
MONO ABS: 368 {cells}/uL (ref 200–950)
MPV: 10.5 fL (ref 7.5–12.5)
Monocytes Relative: 8 %
NEUTROS ABS: 2668 {cells}/uL (ref 1500–7800)
Neutrophils Relative %: 58 %
PLATELETS: 223 10*3/uL (ref 140–400)
RBC: 4.43 MIL/uL (ref 3.80–5.10)
RDW: 14.2 % (ref 11.0–15.0)
WBC: 4.6 10*3/uL (ref 3.8–10.8)

## 2016-11-23 LAB — TSH: TSH: 3.47 m[IU]/L

## 2016-11-23 NOTE — Patient Instructions (Signed)
Medication Instructions:  CONTINUE YOUR FUROSEMIDE DAILY UNTIL YOU BREATHING IMPROVES  Labwork: BMET/CBC/TSH AT [facility] ON THE FIRST FLOOR  Testing/Procedures: NONE  Follow-Up: Your physician recommends that you schedule a follow-up appointment in: 2 MONTH OV  If you need a refill on your cardiac medications before your next appointment, please call your pharmacy.

## 2016-11-23 NOTE — Addendum Note (Signed)
Addended by: Regis Bill B on: 11/23/2016 02:00 PM   Modules accepted: Orders

## 2016-11-25 ENCOUNTER — Encounter (HOSPITAL_COMMUNITY): Payer: Self-pay | Admitting: Internal Medicine

## 2016-11-25 ENCOUNTER — Ambulatory Visit (HOSPITAL_COMMUNITY)
Admission: RE | Admit: 2016-11-25 | Discharge: 2016-11-25 | Disposition: A | Discharge status: Home / Self Care | Payer: BLUE CROSS/BLUE SHIELD | Source: Ambulatory Visit | Attending: Internal Medicine | Admitting: Internal Medicine

## 2016-11-25 VITALS — BP 114/48 | HR 80 | Ht 60.0 in | Wt 95.0 lb

## 2016-11-25 DIAGNOSIS — Z95810 Presence of automatic (implantable) cardiac defibrillator: Secondary | ICD-10-CM | POA: Diagnosis not present

## 2016-11-25 DIAGNOSIS — Z7901 Long term (current) use of anticoagulants: Secondary | ICD-10-CM | POA: Diagnosis not present

## 2016-11-25 DIAGNOSIS — F419 Anxiety disorder, unspecified: Secondary | ICD-10-CM | POA: Diagnosis not present

## 2016-11-25 DIAGNOSIS — I951 Orthostatic hypotension: Secondary | ICD-10-CM

## 2016-11-25 DIAGNOSIS — F329 Major depressive disorder, single episode, unspecified: Secondary | ICD-10-CM | POA: Diagnosis not present

## 2016-11-25 DIAGNOSIS — I480 Paroxysmal atrial fibrillation: Secondary | ICD-10-CM

## 2016-11-25 DIAGNOSIS — I719 Aortic aneurysm of unspecified site, without rupture: Secondary | ICD-10-CM | POA: Insufficient documentation

## 2016-11-25 DIAGNOSIS — I5043 Acute on chronic combined systolic (congestive) and diastolic (congestive) heart failure: Secondary | ICD-10-CM | POA: Diagnosis present

## 2016-11-25 DIAGNOSIS — Z9889 Other specified postprocedural states: Secondary | ICD-10-CM | POA: Diagnosis not present

## 2016-11-25 DIAGNOSIS — Z7982 Long term (current) use of aspirin: Secondary | ICD-10-CM | POA: Diagnosis not present

## 2016-11-25 DIAGNOSIS — I340 Nonrheumatic mitral (valve) insufficiency: Secondary | ICD-10-CM | POA: Insufficient documentation

## 2016-11-25 DIAGNOSIS — Z8674 Personal history of sudden cardiac arrest: Secondary | ICD-10-CM | POA: Insufficient documentation

## 2016-11-25 DIAGNOSIS — I5042 Chronic combined systolic (congestive) and diastolic (congestive) heart failure: Secondary | ICD-10-CM | POA: Diagnosis not present

## 2016-11-25 MED ORDER — FUROSEMIDE 20 MG PO TABS: 20.0000 mg | ORAL_TABLET | Freq: Every day | ORAL | 3 refills | Status: DC | PRN

## 2016-11-25 MED ORDER — POTASSIUM CHLORIDE CRYS ER 20 MEQ PO TBCR: 20.0000 meq | EXTENDED_RELEASE_TABLET | ORAL | 3 refills | Status: DC | PRN

## 2016-11-25 NOTE — Progress Notes (Signed)
[name], female   DOB: 07/20/1960, 56 y.o.   MRN: [id number]   [facility]IN[facility] NOTE   Date:  11/25/2016   ID:  Diane [name], DOB [birth year], MRN [id number]  PCP:  Darrow Bussing, MD  Cardiologist:   Dr. Duke Salvia Electrophysiologist: Dr. Berton Mount   [name] is a 56 y.o. female who is referred to the HF Clinic by Dr. Duke Salvia for further evaluation of her chronic systolic and diastolic heart failure and possible infiltrative CM.  Says her symptoms started about 2 years ago with syncope and DOE. Finally referred to Dr. Duke Salvia in 12/16. Echocardiography revealed severe mitral regurgitation and a reduced LVEF (45-50%).  The MR was due to tethering of the posterior leaflet.  She was also noted to have grade 3 diastolic dysfunction with mid basal to mid inferolateral wall hypokinesis.  On 07/22/15 underwent repair of her mitral valve with a 26 mm Sorin Memo 3D annuloplasty ring on 07/22/15 by Dr. Cornelius Moras.  Intra-operatively she was noted to have a PDA with left to right shunting that was ligated in the OR. The left atrium was also clipped.   Her post operative course was initially unremarkable and she was discharged on post-op day 5.    On 08/05/15 she suffered a cardiac arrest at home and was resuscitated by EMS.  The initial rhythm was unclear but she had VT upon arrival in the ED, which was followed by atrial fibrillation.  She was treated with hypothermia and had prolonged hospital course. Fortunately she has no residual cognitive deficits.  During that hospitalization she underwent cardiac MRI with LVEF 45% with hypokinesis of the distal septum and apex.  There was diffuse, delayed enhancement in the subendocardium and mid epicardial region, most prominent in the inferior wall.  This was concerning for myocarditis or infiltrative cardiomyopathy.  Her EKG showed QT prolongation and she had an ICD placed for secondary prevention.  Her hospitalization was delayed by  severe orthostatic hypotension and recurrent syncope requiring high doses of florinef and midodrine which were weaned down.  She was discharged home but was readmitted with volume overload on 2/16.  Echo showed LVEF 35% with diffuse hypokinesis and her mitral valve was functioning well.  She also underwent L thoracentesis with removal of 1.4L and received IV lasix.  She was discharged home on 2/16 and followed up in clinic on 2/23.  At that time she reported some dizziness and was hypotensive to 84/58.   She underwent Pleurex catheter placement for recurrent left pleural effusion on 10/20/16.   We referred her to Dr. Erenest Rasher at [facility] and underwent Tilt Table testing 05/2016 that revealed autonomic failure.  Her BP dropped to 55/32.  She was started on mestanon but has been unable to tolerate it due to diarrhea.  She was also referred for a genetic evaluation given her long QT and SCD.  She has not yet been evaluated by this clinic. Has f/u visit soon.   Ms. Wilinski spent the winter in [place] with her sister.  Her sister pushed her to start walking and she is now able to do much more.  She was feeling depressed but her mood is now much better.  She continues to have episodes of dizziness but less frequently.  She gets lightheded when she exerts herself.  She had two syncopal episodes while in [place]. She is now able to walk without a cane.  Since returning from [place] she has noted increased shortness of  breath and edema.  She started back taking lasix 20 mg daily, which has helped.  She denies orthopnea or PND. She saw Dr. Duke Salvia yesterday and Oleta Mouse was increased. BP was too low to titrate meds. She continues to drain from her Pleurex between 200-42ml three times per week. At last visit we increased ivabradine to 7.5 mg bid but she could not tolerate that due to severe weakness.     Past Medical History:  Diagnosis Date  . AICD (automatic cardioverter/defibrillator) present     . Anemia   . Anxiety   . Aortic aneurysm (HCC)   . Arthritis   . Cardiac arrest (HCC)   . Chronic combined systolic and diastolic CHF (congestive heart failure) (HCC) 07/03/2015   Grade 3 diastolic dysfunction.  LVEF 40-45% 05/2015.  Marland Kitchen Depression    SOME DEPRESSION (ON MEDS FOR 2 YRS) WHEN HER MOTHER PASSED  . Patent ductus arteriosus 07/16/2015  . 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 07/22/2015   26 mm Sorin Memo 3D ring annuloplasty with ligation of patent ductus arteriosus and clipping of LA appendage  . Severe mitral regurgitation 07/03/2015  . Shortness of breath dyspnea     Past Surgical History:  Procedure Laterality Date  . CARDIAC CATHETERIZATION N/A 07/08/2015   Procedure: Right/Left Heart Cath and Coronary Angiography;  Surgeon: Marykay Lex, MD;  Location: [facility] INVASIVE CV LAB;  Service: Cardiovascular;  Laterality: N/A;  . CHEST TUBE INSERTION Left 10/21/2015   Procedure: INSERTION left PLEURAL DRAINAGE CATHETER;  Surgeon: Purcell Nails, MD;  Location: [facility] OR;  Service: Thoracic;  Laterality: Left;  . CLIPPING OF ATRIAL APPENDAGE  07/22/2015   Procedure: CLIPPING OF LEFT ATRIAL APPENDAGE;  Surgeon: Purcell Nails, MD;  Location: [facility] OR;  Service: Open Heart Surgery;;  35 Atriclip Pro  . COLONOSCOPY    . EP IMPLANTABLE DEVICE N/A 08/15/2015   Procedure: ICD Implant;  Surgeon: Will Jorja Loa, MD;  Location: [facility] INVASIVE CV LAB;  Service: Cardiovascular;  Laterality: N/A;  . MICROLARYNGOSCOPY WITH CO2 LASER AND EXCISION OF VOCAL CORD LESION N/A 11/04/2015   Procedure: MICROLARYNGOSCOPY, RADIESSE VOCAL CORD AUGMENTATION;  Surgeon: Flo Shanks, MD;  Location: [facility] OR;  Service: ENT;  Laterality: N/A;  . MITRAL VALVE REPAIR N/A 07/22/2015   Procedure: MITRAL VALVE REPAIR;  Surgeon: Purcell Nails, MD;  Location: [facility] OR;  Service: Open Heart Surgery;  Laterality: N/A;  26 Sorin Memo 3D  . OVARIAN CYST REMOVAL    . PATENT DUCTUS ARTERIOUS REPAIR N/A  07/22/2015   Procedure: PATENT DUCTUS ARTERIOSUS (PDA) ligation;  Surgeon: Purcell Nails, MD;  Location: [facility] OR;  Service: Open Heart Surgery;  Laterality: N/A;  . TEE WITHOUT CARDIOVERSION N/A 07/07/2015   Procedure: TRANSESOPHAGEAL ECHOCARDIOGRAM (TEE);  Surgeon: Chilton Si, MD;  Location: [facility] ENDOSCOPY;  Service: Cardiovascular;  Laterality: N/A;  . TEE WITHOUT CARDIOVERSION N/A 07/22/2015   Procedure: TRANSESOPHAGEAL ECHOCARDIOGRAM (TEE);  Surgeon: Purcell Nails, MD;  Location: [facility] OR;  Service: Open Heart Surgery;  Laterality: N/A;     Current Outpatient Prescriptions  Medication Sig Dispense Refill  . aspirin 81 MG tablet Take 81 mg by mouth daily.    Retia Passe 5 MG TABS tablet TAKE 1 TABLET (5 MG TOTAL) BY MOUTH 2 (TWO) TIMES DAILY WITH A MEAL. 60 tablet 3  . Droxidopa (NORTHERA) 200 MG CAPS Take 1 capsule by mouth 3 (three) times daily.    . furosemide (LASIX) 20 MG tablet Take  20 mg by mouth daily as needed.    . midodrine (PROAMATINE) 10 MG tablet Take 1 tablet (10 mg total) by mouth 3 (three) times daily with meals. 270 tablet 3   No current facility-administered medications for this encounter.     Allergies:   Vicodin [hydrocodone-acetaminophen]; Other; Adhesive [tape]; Cymbalta [duloxetine hcl]; Oxycodone; and Silicone    Social History:  The patient  reports that she has never smoked. She has never used smokeless tobacco. She reports that she does not drink alcohol or use drugs.   Family History:  The patient's family history includes Diabetes in her father; Heart disease in her mother; Hypertension in her mother; Stomach cancer in her father.    ROS:  Please see the history of present illness.  Otherwise, review of systems are positive for none.   All other systems are reviewed and negative.    PHYSICAL EXAM: VS:  BP (!) 114/48 (BP Location: Left Arm, Patient Position: Sitting, Cuff Size: Normal)   Pulse 80   Ht 5' (1.524 m)   Wt 95 lb (43.1 kg)   SpO2 100%    BMI 18.55 kg/m  , BMI Body mass index is 18.55 kg/m. GENERAL:  NAD. Thin. Raspy voice  HEENT: normal Neck: supple. JVP 7. Carotids 2+ bilat; no bruits. No lymphadenopathy or thryomegaly appreciated. Cor: PMI nondisplaced. Regular rate & rhythm. No rubs, gallops or murmurs. pleurex cath site ok. Lungs: clear Abdomen: soft, nontender, nondistended. No hepatosplenomegaly. No bruits or masses. Good bowel sounds. Extremities: no cyanosis, clubbing, rash trace edema. warm Neuro: alert & orientedx3, cranial nerves grossly intact. moves all 4 extremities w/o difficulty. Affect pleasant    PFT 11/05/14:  FEV1  1.9 L FVC : 2.4 L FEV1 and FVC are reduced, but the FEV1/FVC ratio is normal.  Following adminitration of bronchodilator there was no significant response.  Consistent with minimal obstructive airway disease.    LHC 07/08/15:  Severe mitral regurgitation with large V waves and 4+ MR  There is mild to moderate left ventricular systolic dysfunction. With cardiac output/index moderate-severely reduced.  Angiographically normal coronary arteries   Recent Labs: 11/23/2016: ALT 28; BUN 18; Creat 0.72; Hemoglobin 13.9; Platelets 223; Potassium 4.2; Sodium 136; TSH 3.47    Lipid Panel    Component Value Date/Time   CHOL 181 09/04/2015 0000      Wt Readings from Last 3 Encounters:  11/25/16 95 lb (43.1 kg)  11/23/16 92 lb (41.7 kg)  06/16/16 95 lb (43.1 kg)      ASSESSMENT AND PLAN:  1) Acute on  Chronic systolic and diastolic heart failure: --EF 35-40% on echo 2/17; 40% on cMRI. S/p ICD for SCD. NYHA III --Volume status bck up on exam and Optivol. Agree with getting her back on lasix as needed --MRI reviewed and is suggestive of possible infiltrative process or myocarditis. I actually favor myocarditis. SPEP/UPEP negative as well as serologies for CTD. Unfortunately cannot repeat MRI due to ICD.  --I agree with Dr. Duke Salvia. Will repeat echo, If EF still down we should refer  back to [facility] for possible endomyocardial biopsy. I will d/w Dr. Read Drivers after echo done --She continues to require midodrine and Northera for BP support --Continue corlanor 5mg   Bid - unable to tolerate lower dose  2) Recurrent left pleural effusion --Pleurex catheter continues to drain. Followed by Dr. Cornelius Moras and [facility].   3) Mitral regurgitation s/p mitral valve repair:  Stable s/p MV repair  4) Paroxysmal atrial fibrillation: Remains in  sinus rhythm.  LA appendage was clipped in surgery.  The patients CHA2DS2-VASc Score and unadjusted Ischemic Stroke Rate (% per year) is equal to 2.2 % stroke rate/year from a score of 2 . Continue warfarin.   5) Orthostatic hypotension:  --She has been evaluated by Dr. Read Drivers at [facility] with markedly + tilt table. Continue current regimen. She could not tolerate mestinon.   Arvilla Meres, MD  11/25/2016 10:48 AM

## 2016-11-25 NOTE — Patient Instructions (Signed)
Take Furosemide (Lasix) as needed  Your physician has requested that you have an echocardiogram. Echocardiography is a painless test that uses sound waves to create images of your heart. It provides your doctor with information about the size and shape of your heart and how well your heart's chambers and valves are working. This procedure takes approximately one hour. There are no restrictions for this procedure.  We will contact you in 4 months to schedule your next appointment.

## 2016-11-26 ENCOUNTER — Encounter: Payer: Self-pay | Admitting: Cardiology

## 2016-11-29 ENCOUNTER — Ambulatory Visit: Payer: BLUE CROSS/BLUE SHIELD | Admitting: Thoracic Surgery (Cardiothoracic Vascular Surgery)

## 2016-12-07 ENCOUNTER — Other Ambulatory Visit: Payer: Self-pay

## 2016-12-07 ENCOUNTER — Ambulatory Visit (HOSPITAL_COMMUNITY): Payer: BLUE CROSS/BLUE SHIELD | Attending: Cardiovascular Disease

## 2016-12-07 DIAGNOSIS — Z95810 Presence of automatic (implantable) cardiac defibrillator: Secondary | ICD-10-CM | POA: Insufficient documentation

## 2016-12-07 DIAGNOSIS — I5042 Chronic combined systolic (congestive) and diastolic (congestive) heart failure: Secondary | ICD-10-CM | POA: Diagnosis not present

## 2016-12-07 DIAGNOSIS — Z8249 Family history of ischemic heart disease and other diseases of the circulatory system: Secondary | ICD-10-CM | POA: Diagnosis not present

## 2016-12-07 DIAGNOSIS — I081 Rheumatic disorders of both mitral and tricuspid valves: Secondary | ICD-10-CM | POA: Insufficient documentation

## 2016-12-07 DIAGNOSIS — J90 Pleural effusion, not elsewhere classified: Secondary | ICD-10-CM | POA: Diagnosis not present

## 2016-12-07 NOTE — Progress Notes (Addendum)
Cardiology Office Note Date:  12/08/2016  Patient ID:  Diane [name], Diane [name] [birth year], MRN [id number] PCP:  Darrow Bussing, MD  Cardiologist:  Dr. Duke Salvia CHF: Dr. Gala Romney Electrophysiologist: Dr. Graciela Husbands    Chief Complaint: 6 month EP visit  History of Present Illness: Diane [name] is a 56 y.o. female with history of syncope, during this workup fond with severe MR s/p MV repair with ligation of a PDA and LAA clipping on 07/22/15, she was discharged, at home she had an unwitnessed cardiac arrest given CPR by family and EMS found her in [place], PEA was also reported she had several rounds of CPR/meds and 3 shocks, she underwent hypothermia protocol, prior to these events PMHx included only depression, she had no significant CAD by cath noted in her MR work-up.  She did have prolonged QT, She underwent ICD implant.  Her post arrest hospitalization was lengthened by severe persistent orthostatic hypotension treated with florinef and midodrine, a re-hospitalization after this 2/2 fluid OL ultimately L thoracentesis, in out patient f/u after this noted reacumulation CTS discussed Plurex or VATs though pt preferred repeat thoracentesis.  She had also unfortunately through all of this vocal cord paralysis and is following with ENT.  She saw CTS again with recurrent L effusion, decline Plurex catheter placement and lasix adjusted, though ultimately did require and agreed to Plurex catheter in April, she was seen at [facility], Dr. Erenest Rasher at [facility] and underwent Tilt Table testing 05/2016 that revealed autonomic failure. Her BP dropped to 55/32. She was started on mestanon but has been unable to tolerate it due to diarrhea.She was also referred for a genetic evaluation given her long QT and SCD. She has not yet been evaluated by this clinic. Has f/u visit soon. She remains with Plurex catheter.  She saw Dr. Gala Romney earlier this month he noted she spent time in [place], with much improvement with the  support and encouragement of family, had 2 syncopal episodes out there but essentially more active and able to do things.  She comes in today to be seen for Dr. Graciela Husbands, last seen by him in July 2017 her Proamatine was decreased with symptoms of scalp itching/tingling a tremulous sensation and recommended to elevate the HOB.  She comes in today for ICD check, she is following with CHF, Dr. Gala Romney, Dr. Duke Salvia, and Dr. Read Drivers closely, she is self draining her Plurex catheter M-W-F, and due to see Dr. Cornelius Moras in a couple months.  She remains fluid OL, despite taking the lasix again currently taking 2x week, more then tha seems to makes her feel more wiped out then usual, but she can tell she is still bloated and has had increased drainage from her catheter as well. Intermittently will have some blood in the drainage, but this is always transient, and not new.  She is no longer on warfarin given LAA clipping, no AF on her device, and no flow in LAA by MRI.  She has not fainted in the last 4-5 weeks, remains somewhat orthostatic, but currently better then usual.  She mentions a general discomfort/aching at her ICD since implant.  Device information: MDT dual chambe ICD, implanted 08/14/16, Dr. Elberta Fortis, indication: VT/cardiac arrest   Past Medical History:  Diagnosis Date  . AICD (automatic cardioverter/defibrillator) present   . Anemia   . Anxiety   . Aortic aneurysm (HCC)   . Arthritis   . Cardiac arrest (HCC)   . Chronic combined systolic and diastolic CHF (congestive heart failure) (HCC) 07/03/2015  Grade 3 diastolic dysfunction.  LVEF 40-45% 05/2015.  Marland Kitchen Depression    SOME DEPRESSION (ON MEDS FOR 2 YRS) WHEN HER MOTHER PASSED  . Patent ductus arteriosus 07/16/2015  . 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 07/22/2015   26 mm Sorin Memo 3D ring annuloplasty with ligation of patent ductus arteriosus and clipping of LA appendage  . Severe mitral regurgitation  07/03/2015  . Shortness of breath dyspnea     Past Surgical History:  Procedure Laterality Date  . CARDIAC CATHETERIZATION N/A 07/08/2015   Procedure: Right/Left Heart Cath and Coronary Angiography;  Surgeon: Marykay Lex, MD;  Location: [facility] INVASIVE CV LAB;  Service: Cardiovascular;  Laterality: N/A;  . CHEST TUBE INSERTION Left 10/21/2015   Procedure: INSERTION left PLEURAL DRAINAGE CATHETER;  Surgeon: Purcell Nails, MD;  Location: [facility] OR;  Service: Thoracic;  Laterality: Left;  . CLIPPING OF ATRIAL APPENDAGE  07/22/2015   Procedure: CLIPPING OF LEFT ATRIAL APPENDAGE;  Surgeon: Purcell Nails, MD;  Location: [facility] OR;  Service: Open Heart Surgery;;  35 Atriclip Pro  . COLONOSCOPY    . EP IMPLANTABLE DEVICE N/A 08/15/2015   Procedure: ICD Implant;  Surgeon: Will Jorja Loa, MD;  Location: [facility] INVASIVE CV LAB;  Service: Cardiovascular;  Laterality: N/A;  . MICROLARYNGOSCOPY WITH CO2 LASER AND EXCISION OF VOCAL CORD LESION N/A 11/04/2015   Procedure: MICROLARYNGOSCOPY, RADIESSE VOCAL CORD AUGMENTATION;  Surgeon: Flo Shanks, MD;  Location: [facility] OR;  Service: ENT;  Laterality: N/A;  . MITRAL VALVE REPAIR N/A 07/22/2015   Procedure: MITRAL VALVE REPAIR;  Surgeon: Purcell Nails, MD;  Location: [facility] OR;  Service: Open Heart Surgery;  Laterality: N/A;  26 Sorin Memo 3D  . OVARIAN CYST REMOVAL    . PATENT DUCTUS ARTERIOUS REPAIR N/A 07/22/2015   Procedure: PATENT DUCTUS ARTERIOSUS (PDA) ligation;  Surgeon: Purcell Nails, MD;  Location: [facility] OR;  Service: Open Heart Surgery;  Laterality: N/A;  . TEE WITHOUT CARDIOVERSION N/A 07/07/2015   Procedure: TRANSESOPHAGEAL ECHOCARDIOGRAM (TEE);  Surgeon: Chilton Si, MD;  Location: [facility] ENDOSCOPY;  Service: Cardiovascular;  Laterality: N/A;  . TEE WITHOUT CARDIOVERSION N/A 07/22/2015   Procedure: TRANSESOPHAGEAL ECHOCARDIOGRAM (TEE);  Surgeon: Purcell Nails, MD;  Location: [facility] OR;  Service: Open Heart Surgery;  Laterality: N/A;    Current Outpatient  Prescriptions  Medication Sig Dispense Refill  . aspirin 81 MG tablet Take 81 mg by mouth daily.    Retia Passe 5 MG TABS tablet TAKE 1 TABLET (5 MG TOTAL) BY MOUTH 2 (TWO) TIMES DAILY WITH A MEAL. 60 tablet 3  . Droxidopa (NORTHERA) 200 MG CAPS Take 1 capsule by mouth 3 (three) times daily.    . furosemide (LASIX) 20 MG tablet Take 1 tablet (20 mg total) by mouth daily as needed. 30 tablet 3  . midodrine (PROAMATINE) 10 MG tablet Take 1 tablet (10 mg total) by mouth 3 (three) times daily with meals. 270 tablet 3  . potassium chloride SA (K-DUR,KLOR-CON) 20 MEQ tablet Take 1 tablet (20 mEq total) by mouth as needed (when you take lasix). 30 tablet 3   No current facility-administered medications for this visit.     Allergies:   Vicodin [hydrocodone-acetaminophen]; Other; Adhesive [tape]; Cymbalta [duloxetine hcl]; Oxycodone; and Silicone   Social History:  The patient  reports that she has never smoked. She has never used smokeless tobacco. She reports that she does not drink alcohol or use drugs.   Family History:  The patient's  family history includes Diabetes in her father; Heart disease in her mother; Hypertension in her mother; Stomach cancer in her father.  ROS:  Please see the history of present illness.    All other systems are reviewed and otherwise negative.   PHYSICAL EXAM:  VS:  BP 113/68   Pulse 76   Ht 5\' 1"  (1.549 m)   Wt 99 lb (44.9 kg)   BMI 18.71 kg/m  BMI: Body mass index is 18.71 kg/m. Vert thin, frail in apperance, in no acute distress  HEENT: normocephalic, atraumatic  Neck: no JVD, carotid bruits or masses Cardiac:  RRR; no significant murmurs, no rubs, or gallops Lungs:  CTA b/l, not diminished, no wheezing, rhonchi or rales  Plurex catheter L, is clean, no erythema, edema or drainage at site Abd: soft, nontender MS: no deformity or atrophy Ext: 1+ edema  Skin: warm and dry, no rash Neuro:  No gross deficits appreciated Psych: euthymic mood, full  affect  ICD site is stable, no tethering or discomfort, very thin body habitus, no skin changes or evidence of thinning or erosion   EKG:  Done 11/23/16 SR, 78bpm, IVCD, LAD,  PR , QRS , QTc ICD interrogation done today by industry and reviewed by myself: battery and lead measurements are good, Optivol is very elevated.  Since Oct 2017 she has not had any VT or AF episodes LHC 07/08/15:  Severe mitral regurgitation with large V waves and 4+ MR  There is mild to moderate left ventricular systolic dysfunction. With cardiac output/index moderate-severely reduced.  Angiographically normal coronary arteries  Cardiac MRI 08/14/15: IMPRESSION: 1) Mildly asymmetric septal hypertrophy 15 mm. Distal septal apical hypokinesis EF 45% 2) Diffuse delayed gadolinium uptake in the subendocardial and mid epicardial areas especially marked in the inferior wall 3) S/P mitral valve repair with no residual MR and annuloplasty ring 4) Atri-Clip of the LAA with no flow 5) No residual PDA flow 6) Mild LAE 7) Small pericardial effusion 8) Large left pleural effusio Note delayed gadolinium findings may be consistent with myocarditis vs infiltrative cardiomyopathy   Echo 09/04/15: Study Conclusions - Left ventricle: The cavity size was normal. There was mild concentric hypertrophy. Systolic function was moderately reduced. The estimated ejection fraction was in the range of 35% to 40%. Diffuse hypokinesis. - Aortic valve: Trileaflet; mildly thickened, mildly calcified leaflets. There was trivial regurgitation. - Mitral valve: S/P mitral valve repair with annuloplasty ring. The findings are consistent with mild stenosis. Valve area by continuity equation (using LVOT flow): 1.34 cm^2. - Tricuspid valve: There was mild regurgitation. - Pulmonary arteries: PA peak pressure: 39 mm Hg (S). - Pericardium, extracardiac: There was a left pleural effusion. Impressions: - The right  ventricular systolic pressure was increased consistent with mild pulmonary hypertension.  Recent Labs: 11/23/2016: ALT 28; BUN 18; Creat 0.72; Hemoglobin 13.9; Platelets 223; Potassium 4.2; Sodium 136; TSH 3.47  No results found for requested labs within last 8760 hours.   Estimated Creatinine Clearance: 54.3 mL/min (by C-G formula based on SCr of 0.72 mg/dL).   Wt Readings from Last 3 Encounters:  12/08/16 99 lb (44.9 kg)  11/25/16 95 lb (43.1 kg)  11/23/16 92 lb (41.7 kg)     Other studies reviewed: Additional studies/records reviewed today include: summarized above  ASSESSMENT AND PLAN:  1. Cardiac arrest w/ICD     Stable device function, no changes made     No V arrhythmias on device     Pending genetic testing  2.  NICM    On PRN lasix, unable to get started in BB/ACd given ongoing issues with orthosstais and hypotension    Pending repeat echo via Dr. Duke Salvia, if remains depressed Dr. Teressa Lower recommends EM bx She remains with fluid OL, she is using lasix 2x week, her optivol remains elevated and her weight is up as well.    Discussed with Dr. Elberta Fortis, she is advised to call CHF clinic/Dr. bensimhon for guidance with her diuretic/lasix   3. Orthostatic hypotension     Markedly + Tilt test     on Northera and midodrine, unable to tolerate Mestinon     Following with Dr. Read Drivers Kateri Mc  4. VHD     S/p MV repair  5. PAFib     S/p LAA clipping     CHA2DS2Vasc is 2, off Warfarin with no flow noted in LAA by cardiac MR     No AF on her device  6. Persistent/recurrent L pleural effusion     Plurex catheter     Dr. Cornelius Moras     Self drains M-W-F     She will discuss with Dr. Cornelius Moras intermittent blood tinged appearance    Disposition: F/u with 3 month remote checks, 1 year with EP, Dr. Graciela Husbands, sooner if needed.  She will continue close management with Dr. Duke Salvia, Dr. Gala Romney, Dr. Bascom Levels mills and Dr. Cornelius Moras.  Current medicines are reviewed at length with the  patient today.  The patient did not have any concerns regarding medicines.  Judith Blonder, PA-C 12/08/2016 12:30 PM     [facility] [address] [place] [address] [phone] (office)  [phone] (fax)

## 2016-12-08 ENCOUNTER — Encounter (HOSPITAL_COMMUNITY): Payer: BLUE CROSS/BLUE SHIELD | Admitting: Internal Medicine

## 2016-12-08 ENCOUNTER — Ambulatory Visit (INDEPENDENT_AMBULATORY_CARE_PROVIDER_SITE_OTHER): Payer: BLUE CROSS/BLUE SHIELD | Admitting: Physician Assistant

## 2016-12-08 VITALS — BP 113/68 | HR 76 | Ht 61.0 in | Wt 99.0 lb

## 2016-12-08 DIAGNOSIS — Z95810 Presence of automatic (implantable) cardiac defibrillator: Secondary | ICD-10-CM | POA: Diagnosis not present

## 2016-12-08 DIAGNOSIS — I420 Dilated cardiomyopathy: Secondary | ICD-10-CM

## 2016-12-08 DIAGNOSIS — I951 Orthostatic hypotension: Secondary | ICD-10-CM | POA: Diagnosis not present

## 2016-12-08 DIAGNOSIS — I469 Cardiac arrest, cause unspecified: Secondary | ICD-10-CM | POA: Diagnosis not present

## 2016-12-08 DIAGNOSIS — I5043 Acute on chronic combined systolic (congestive) and diastolic (congestive) heart failure: Secondary | ICD-10-CM

## 2016-12-08 NOTE — Patient Instructions (Signed)
Medication Instructions:   Your physician recommends that you continue on your current medications as directed. Please refer to the Current Medication list given to you today.   If you need a refill on your cardiac medications before your next appointment, please call your pharmacy.  Labwork: NONE ORDERED  TODAY    Testing/Procedures: NONE ORDERED  TODAY    Follow-Up: Your physician wants you to follow-up in: ONE YEAR WITH klein  You will receive a reminder letter in the mail two months in advance. If you don't receive a letter, please call our office to schedule the follow-up appointment.   Remote monitoring is used to monitor your Pacemaker of ICD from home. This monitoring reduces the number of office visits required to check your device to one time per year. It allows [place] to keep an eye on the functioning of your device to ensure it is working properly. You are scheduled for a device check from home on 03-10-17  . You may send your transmission at any time that day. If you have a wireless device, the transmission will be sent automatically. After your physician reviews your transmission, you will receive a postcard with your next transmission date.    Any Other Special Instructions Will Be Listed Below (If Applicable).

## 2016-12-10 ENCOUNTER — Encounter: Payer: Self-pay | Admitting: Cardiology

## 2016-12-17 ENCOUNTER — Other Ambulatory Visit: Payer: Self-pay | Admitting: Thoracic Surgery (Cardiothoracic Vascular Surgery)

## 2016-12-17 DIAGNOSIS — J90 Pleural effusion, not elsewhere classified: Secondary | ICD-10-CM

## 2016-12-20 ENCOUNTER — Ambulatory Visit
Admission: RE | Admit: 2016-12-20 | Discharge: 2016-12-20 | Disposition: A | Discharge status: Home / Self Care | Payer: BLUE CROSS/BLUE SHIELD | Source: Ambulatory Visit | Attending: Thoracic Surgery (Cardiothoracic Vascular Surgery) | Admitting: Thoracic Surgery (Cardiothoracic Vascular Surgery)

## 2016-12-20 ENCOUNTER — Ambulatory Visit (INDEPENDENT_AMBULATORY_CARE_PROVIDER_SITE_OTHER): Payer: BLUE CROSS/BLUE SHIELD | Admitting: Thoracic Surgery (Cardiothoracic Vascular Surgery)

## 2016-12-20 ENCOUNTER — Encounter: Payer: Self-pay | Admitting: Thoracic Surgery (Cardiothoracic Vascular Surgery)

## 2016-12-20 VITALS — BP 147/81 | HR 84 | Resp 16 | Ht 61.0 in | Wt 96.4 lb

## 2016-12-20 DIAGNOSIS — J90 Pleural effusion, not elsewhere classified: Secondary | ICD-10-CM

## 2016-12-20 DIAGNOSIS — I5042 Chronic combined systolic (congestive) and diastolic (congestive) heart failure: Secondary | ICD-10-CM | POA: Diagnosis not present

## 2016-12-20 DIAGNOSIS — Z9889 Other specified postprocedural states: Secondary | ICD-10-CM

## 2016-12-20 NOTE — Patient Instructions (Signed)
Continue all previous medications without any changes at this time  

## 2016-12-20 NOTE — Progress Notes (Signed)
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  She has had a complex course over the past year although she reportedly has made significant improvement over the past 4-6 months. Despite this her Pleurx catheter continues to drain approximately 350 mL 3 times a week. The patient states that the pleural fluid typically looks clear but T colored. Intermittently there is slight blood tinge. She states that the fluid looked more clear when she was in [place] with her brother. The fluid has never appeared cloudier murky. 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 She has had chronic cough, severe fatigue, exertional shortness breath, and severe orthostatic dizzy spells related to autonomic failure. She states that over the winter months she spent time with her family in [place] and she did quite well.  She is now in much better spirits, her appetite has improved, and she seems to be getting around reasonably well. She is now taking Lasix more regularly as she continues to have intermittent episodes of lower extremity  edema and fluid retention. She denies any fevers or chills. She denies any productive cough. She does not have any pain or chest. Her Pleurx catheter continues to function normally although the distal tip of the catheter was broken and has been patched.   Current Outpatient Prescriptions  Medication Sig Dispense Refill  . aspirin 81 MG tablet Take 81 mg by mouth daily.    Retia Passe 5 MG TABS tablet TAKE 1 TABLET (5 MG TOTAL) BY MOUTH 2 (TWO) TIMES DAILY WITH A MEAL. 60 tablet 3  . Droxidopa (NORTHERA) 200 MG CAPS Take 1 capsule by mouth 3 (three) times daily.    . furosemide (LASIX) 20 MG tablet Take 1 tablet (20 mg total) by mouth daily as needed. 30 tablet 3  . midodrine (PROAMATINE) 10 MG tablet Take 1 tablet (10 mg total) by mouth 3 (three) times daily with meals. 270 tablet 3  . potassium chloride SA (K-DUR,KLOR-CON) 20 MEQ tablet Take 1 tablet (20 mEq total) by mouth as needed (when you take lasix). 30 tablet 3   No current facility-administered medications for this visit.       Physical Exam:   BP (!) 147/81 (BP Location: Left Arm, Patient Position: Sitting, Cuff Size: Normal)   Pulse 84   Resp 16   Ht 5\' 1"  (1.549 m)   Wt 96 lb 6.4 oz (43.7 kg)   SpO2 99% Comment: ON RA  BMI 18.21 kg/m   General:  Thin and cachectic but in good spirits and overall well-appearing  Chest:   Clear to auscultation with symmetrical breath  sounds, Pleurx catheter in place  CV:   Regular rate and rhythm without murmur  Incisions:  n/a  Abdomen:  Soft nontender  Extremities:  Warm and well-perfused, trivial lower extremity edema  Diagnostic Tests:  Transthoracic Echocardiography  Patient:    Taraya, Steward MR #:       [id number] Study Date: 12/07/2016 Gender:     F Age:        37 Height:     152.4 cm Weight:     43.1 kg BSA:        1.35 m^2 Pt. Status: Room:   ORDERING     Bensimhon, Daniel  REFERRING    Bensimhon, Roxine Caddy, Outpatient  SONOGRAPHER  [facility], RDCS  ATTENDING    Chilton Si, MD  cc:  ------------------------------------------------------------------- LV EF: 30% -   35%  ------------------------------------------------------------------- Indications:      CHF (I50.9).  ------------------------------------------------------------------- History:   PMH:  Status Post Mitral Valve Repair (26 mm Sorin 3D Ring) with Patent Ductus Artereosis Ligation and Left atrial Appendage Clip (07-22-15), Prior EF 35-40%, Aortic Aneurysm, AICD Implanted.  Congestive heart failure.  Risk factors:  Family history of coronary artery disease.  ------------------------------------------------------------------- Study Conclusions  - Left ventricle: The cavity size was normal. There was moderate   concentric hypertrophy. Systolic function was moderately to   severely reduced. The estimated ejection fraction was in the   range of 30% to 35%. Diffuse hypokinesis. Doppler parameters are   consistent with a reversible restrictive pattern, indicative of   decreased left ventricular diastolic compliance and/or increased   left atrial pressure (grade 3 diastolic dysfunction). Doppler   parameters are consistent with high ventricular filling pressure. - Aortic valve: Transvalvular velocity was within the normal range.   There was no stenosis. There was no regurgitation. - Mitral valve: An annular ring prosthesis was present.   Transvalvular velocity was within the normal range. There was no   evidence for stenosis. There was mild regurgitation. - Right ventricle: The cavity size was mildly dilated. Wall   thickness was normal. Systolic function was normal. - Tricuspid valve: There was mild-moderate regurgitation. - Pulmonic valve: There was mild regurgitation. - Pulmonary arteries: Systolic pressure was moderately increased.   PA peak pressure: 52 mm Hg (S). - Pericardium, extracardiac: There was a left pleural  effusion.  Impressions:  - Compared with echo 09/04/15, left ventricular systolic function is   worse.  ------------------------------------------------------------------- Study data:  Comparison was made to the study of 09/04/2015.  Study status:  Routine.  Procedure:  Transthoracic echocardiography. Image quality was adequate.          Transthoracic echocardiography.  M-mode, complete 2D, 3D, spectral Doppler, and color Doppler.  Birthdate:  Patient birthdate: 1960/09/24.  Age: Patient is 56 yr old.  Sex:  Gender: female.    BMI: 18.6 kg/m^2. Blood pressure:     106/84  Patient status:  Outpatient.  Study date:  Study date: 12/07/2016. Study time: 03:10 PM.  Location: [facility]  -------------------------------------------------------------------  ------------------------------------------------------------------- Left ventricle:  The cavity size was normal. There was moderate concentric hypertrophy. Systolic function was moderately to severely reduced. The estimated ejection fraction was in the range of 30% to 35%. Diffuse hypokinesis. Doppler parameters are consistent with a reversible restrictive pattern, indicative of decreased left ventricular diastolic compliance and/or increased left atrial pressure (grade 3 diastolic dysfunction). Doppler parameters are consistent with high ventricular filling pressure.   ------------------------------------------------------------------- Aortic valve:  Trileaflet; normal thickness leaflets. Mobility was not restricted.  Doppler:  Transvalvular velocity was within the normal range. There was no stenosis. There was no regurgitation.   ------------------------------------------------------------------- Aorta:  Aortic root: The aortic root was normal in size.  ------------------------------------------------------------------- Mitral valve:  An annular ring prosthesis was present. Mobility was not restricted.  Doppler:   Transvalvular velocity was within the normal range. There was no evidence for stenosis. There was mild regurgitation.    Peak gradient (D): 6 mm Hg.  ------------------------------------------------------------------- Left atrium:  The atrium was normal in size.  ------------------------------------------------------------------- Right ventricle:  The cavity size was mildly dilated. Wall thickness was normal. Pacer wire or catheter noted in right ventricle. Systolic function was normal.  ------------------------------------------------------------------- Pulmonic valve:    Structurally normal valve.   Cusp separation was normal.  Doppler:  Transvalvular velocity was within the normal range. There was no evidence for stenosis. There was mild regurgitation.  ------------------------------------------------------------------- Tricuspid valve:   Structurally normal valve.    Doppler: Transvalvular velocity was within the normal range. There was mild-moderate regurgitation.  ------------------------------------------------------------------- Pulmonary artery:   The main pulmonary artery was normal-sized. Systolic pressure was moderately increased.  ------------------------------------------------------------------- Right atrium:  The atrium was normal in size.  ------------------------------------------------------------------- Pericardium:  There was no pericardial effusion.  ------------------------------------------------------------------- Systemic veins: Inferior vena cava: The vessel was normal in size.  ------------------------------------------------------------------- Pleura:  There was a left pleural effusion. Diastolic flow reversal noted in the hepatic veins. However, there is no pericardial effusion or diltated IVC that would be expected in tamponade. Findings are not consistent with constriction, which can also cause diastolic flow reversal in the hepatic  vein.  ------------------------------------------------------------------- Measurements   Left ventricle                         Value        Reference  LV ID, ED, PLAX chordal        (L)     34.3  mm     43 - 52  LV ID, ES, PLAX chordal                27.7  mm     23 - 38  LV fx shortening, PLAX chordal (L)     19    %      >=29  LV PW thickness, ED                    16.8  mm     ----------  IVS/LV PW ratio, ED                    0.86         <=1.3  Stroke volume, 2D                      31    ml     ----------  Stroke volume/bsa, 2D                  23    ml/m^2 ----------  LV e&', lateral                         4.74  cm/s   ----------  LV E/e&', lateral                       24.89        ----------  LV e&', medial                          3.09  cm/s   ----------  LV E/e&', medial                        38.19        ----------  LV e&', average                         3.92  cm/s   ----------  LV E/e&', average                       30.14        ----------    Ventricular septum                     Value        Reference  IVS thickness, ED                      14.5  mm     ----------    LVOT                                   Value        Reference  LVOT ID, S                             19    mm     ----------  LVOT area                              2.84  cm^2   ----------  LVOT peak velocity, S                  63.3  cm/s   ----------  LVOT mean velocity, S                  39    cm/s   ----------  LVOT VTI, S                            10.9  cm     ----------    Aorta                                  Value        Reference  Aortic root ID, ED                     29    mm     ----------  Ascending aorta ID, A-P, S             32    mm     ----------    Left atrium                            Value        Reference  LA ID, A-P, ES  39    mm     ----------  LA ID/bsa, A-P                 (H)     2.9   cm/m^2 <=2.2  LA volume, S                            38.6  ml     ----------  LA volume/bsa, S                       28.7  ml/m^2 ----------  LA volume, ES, 1-p A4C                 33.5  ml     ----------  LA volume/bsa, ES, 1-p A4C             24.9  ml/m^2 ----------  LA volume, ES, 1-p A2C                 44.4  ml     ----------  LA volume/bsa, ES, 1-p A2C             33    ml/m^2 ----------    Mitral valve                           Value        Reference  Mitral E-wave peak velocity            118   cm/s   ----------  Mitral A-wave peak velocity            32.5  cm/s   ----------  Mitral deceleration time               180   ms     150 - 230  Mitral peak gradient, D                6     mm Hg  ----------  Mitral E/A ratio, peak                 3.6          ----------    Pulmonary arteries                     Value        Reference  PA pressure, S, DP             (H)     52    mm Hg  <=30    Tricuspid valve                        Value        Reference  Tricuspid regurg peak velocity         330   cm/s   ----------  Tricuspid peak RV-RA gradient          44    mm Hg  ----------    Right atrium                           Value        Reference  RA ID, S-I, ES, A4C                    44.9  mm     34 - 49  RA area, ES, A4C                       14.6  cm^2   8.3 - 19.5  RA volume, ES, A/L                     37.8  ml     ----------  RA volume/bsa, ES, A/L                 28.1  ml/m^2 ----------    Systemic veins                         Value        Reference  Estimated CVP                          8     mm Hg  ----------    Right ventricle                        Value        Reference  TAPSE                                  16.8  mm     ----------  RV pressure, S, DP             (H)     52    mm Hg  <=30  RV s&', lateral, S                      7.15  cm/s   ----------  Legend: (L)  and  (H)  mark values outside specified reference range.  ------------------------------------------------------------------- Prepared and  Electronically Authenticated by  Chilton Si, MD 2018-05-22T18:39:23    CHEST  2 VIEW  COMPARISON:  04/01/2016  FINDINGS: Left basilar chest tube in place. No left pleural effusion. Small right effusion. No pneumothorax. Mild cardiomegaly with left AICD in place. Prior mitral valve repair.  IMPRESSION: Left basilar chest tube without left effusion or pneumothorax. Small right effusion.  Mild cardiomegaly.   Electronically Signed   By: Charlett Nose M.D.   On: 12/20/2016 16:09   Impression:  Continued moderate volume drainage from left Pleurx catheter from chronic left pleural effusion, presumably related to chronic combined systolic and diastolic congestive heart failure.  Plan:  We will drain the patient's catheter and send fluid for culture, cell count, cytology, and a variety of laboratory tests including lipids, cholesterol, and triglycerides. I have discussed the indications, risks, and potential benefits of an attempt at sclerosing the patient's pleural space by injection of talc slurry through the catheter. The patient is not interested in considering that approach. All of her questions been addressed.  She will continue to drain her catheter per her usual protocol for now.  I spent in excess of 15 minutes during the conduct of this office consultation and >50% of this time involved direct face-to-face encounter with the patient for counseling and/or coordination of their care.    Salvatore Decent. Cornelius Moras, MD 12/20/2016 4:29 PM

## 2016-12-22 ENCOUNTER — Ambulatory Visit (INDEPENDENT_AMBULATORY_CARE_PROVIDER_SITE_OTHER): Payer: BLUE CROSS/BLUE SHIELD | Admitting: *Deleted

## 2016-12-22 ENCOUNTER — Other Ambulatory Visit: Payer: Self-pay | Admitting: Thoracic Surgery (Cardiothoracic Vascular Surgery)

## 2016-12-22 DIAGNOSIS — Z9689 Presence of other specified functional implants: Secondary | ICD-10-CM

## 2016-12-22 DIAGNOSIS — I5042 Chronic combined systolic (congestive) and diastolic (congestive) heart failure: Secondary | ICD-10-CM

## 2016-12-22 DIAGNOSIS — J90 Pleural effusion, not elsewhere classified: Secondary | ICD-10-CM

## 2016-12-22 DIAGNOSIS — Z9889 Other specified postprocedural states: Secondary | ICD-10-CM

## 2016-12-22 NOTE — Progress Notes (Signed)
Diane [name] returns today for drainage of her left pleurX catheter which yielded 350 cc of red tinged fluid. The fluid was sent to [facility] for the studies requested by Dr. Cornelius Moras...cytology, cell count with diff, protein, glucose, lipid, cholesterol, amylase, LDH and triglycerides. She will return if the pl drainage decreases significantly. We will notify her with any lab abnormalities.

## 2016-12-23 LAB — CYTOLOGY, FLUID (SOLSTAS)

## 2016-12-30 LAB — PROTEIN, PLEURAL FLUID

## 2016-12-30 LAB — GLUCOSE, PLEURAL OR PERITONEAL FLUID: Glucose, Pleural fluid: 99 mg/dL

## 2017-01-25 ENCOUNTER — Ambulatory Visit: Payer: BLUE CROSS/BLUE SHIELD | Admitting: Cardiovascular Disease

## 2017-02-04 ENCOUNTER — Ambulatory Visit (INDEPENDENT_AMBULATORY_CARE_PROVIDER_SITE_OTHER): Payer: BLUE CROSS/BLUE SHIELD | Admitting: Cardiovascular Disease

## 2017-02-04 ENCOUNTER — Encounter: Payer: Self-pay | Admitting: Cardiovascular Disease

## 2017-02-04 VITALS — BP 108/66 | HR 76 | Ht 61.0 in | Wt 99.8 lb

## 2017-02-04 DIAGNOSIS — G909 Disorder of the autonomic nervous system, unspecified: Secondary | ICD-10-CM | POA: Diagnosis not present

## 2017-02-04 DIAGNOSIS — R0602 Shortness of breath: Secondary | ICD-10-CM | POA: Diagnosis not present

## 2017-02-04 DIAGNOSIS — I480 Paroxysmal atrial fibrillation: Secondary | ICD-10-CM | POA: Diagnosis not present

## 2017-02-04 DIAGNOSIS — Z9889 Other specified postprocedural states: Secondary | ICD-10-CM | POA: Diagnosis not present

## 2017-02-04 DIAGNOSIS — I951 Orthostatic hypotension: Secondary | ICD-10-CM | POA: Diagnosis not present

## 2017-02-04 DIAGNOSIS — I5043 Acute on chronic combined systolic (congestive) and diastolic (congestive) heart failure: Secondary | ICD-10-CM

## 2017-02-04 NOTE — Patient Instructions (Signed)
Medication Instructions:  START IMODIUM 2 MG OTHER THE COUNTER 1 HOUR PRIOR TO TAKING FUROSEMIDE   Labwork: NONE  Testing/Procedures: NONE  Follow-Up: AS NEEDED   If you need a refill on your cardiac medications before your next appointment, please call your pharmacy.

## 2017-02-04 NOTE — Progress Notes (Signed)
Cardiology Office Note  all Date:  02/04/2017   ID:  Saje Gallop, DOB 21-Mar-1961, MRN [id number]  PCP:  Darrow Bussing, MD  Cardiologist:   Chilton Si, MD  Electrophysiologist: Dr. Berton Mount Heart Failure: Dr. Gala Romney   Chief Complaint  Patient presents with  . Follow-up    2 months;     [name] is a 56 y.o. female with chronic systolic and diastolic heart failure, LVEF 35-40%, autonomic failure, s/p mitral valve repair, and VT/long QT s/p ICD here for follow up.  History of Present Illness Ms. Kitt initially presented 05/2015 with recurrent syncope and progressive shortness of breath.  She previously had an exercise Myoview 07/2013 that was negative for ischemia.  She underwent echocardiography 05/2015 that revealed severe mitral regurgitation and a reduced LVEF (45-50%). The MR was due to tethering of the posterior leaflet. She was also noted to have grade 3 diastolic dysfunction with mid basal to mid inferolateral wall hypokinesis.  Ms. Mahlum underwent repair of her mitral valve with a 26 mm Sorin Memo 3D annuloplasty ring on 07/22/15. Intra-operatively she was noted to have a PDA with left to right shunting that was ligated in the OR. The left atrium was also clipped. Her post operative course was initially unremarkable and she was discharged on post-op day 5. On 1/17 she suffered a cardiac arrest at home and was resuscitated by EMS. The initial rhythm was unclear but she had VT upon arrival in the ED, which was followed by atrial fibrillation. She was treated with hypothermia and has no residual cognitive deficits. During that hospitalization she underwent cardiac MRI with LVEF 45% with hypokinesis of the distal septum and apex. There was diffuse, delayed enhancement in the subendocardium and mid epicardial region, most prominent in the inferior wall. This was concerning for myocarditis or infiltrative cardiomyopathy. Her EKG showed QT  prolongation and she had an ICD placed for secondary prevention. Her hospitalization was delayed by severe orthostatic hypotension and recurrent syncope requiring high doses of florinef and midodrine. She was discharged home but was readmitted with volume overload on 2/16. Echo showed LVEF 35-40% with diffuse hypokinesis and her mitral valve was functioning well.  Ms. Scafidi wore an event monitor on 12/22/15 that did not reveal any abnormalities.  Given that her only episode of atrial fibrillation occurred perioperatively and her left atrial appendage has been clipped, warfarin was discontinued.  She had cortisol levels repeated 01/22/16, which were normal.  She also had testing for acetylcholine receptor antibodies and striated muscle antibodies which were both unremarkable.  Ms. Shawn was evaluated by Dr. Erenest Rasher and underwent Tilt Table testing 05/2016 that revealed autonomic failure.  Her BP dropped to 55/32.  She was started on mestanon but has been unable to tolerate it due to diarrhea.  She was also referred for a genetic evaluation given her long QT and SCD.  She has not yet been evaluated by this clinic.  Since her last appointment Ms. Chiou had a repeat echo 12/07/16 that revealed LVEF 30-35% with diffuse hypokinesis and grade 3 diastolic dysfunction. Her mitral valve ring was functioning well. PASD was 52 mmHg.  Ms. Camilli followed up with Dr. Read Drivers on 7/10.  She was not orthostatic at that time. Her Optivol level was elevated.  S  She was not felt to be stable enough for biopsy at that time. She is scheduled to see a genetic cardiologist in September. Lately she hasn't been feeling as well. She has  been draining 300-400 mL from her her Pleurx catheter 3 times weekly.   She also notes abdominal distention and bloating. She has mild lower extremity edema. She has not been taking Lasix for almost one week. She stopped taking it due to diarrhea.  She notes that her energy is  much better when she doesn't have diarrhea. She hasn't had any syncopal episodes lately. She continues to feel a little lightheaded in the morning.   Past Medical History:  Diagnosis Date  . AICD (automatic cardioverter/defibrillator) present   . Anemia   . Anxiety   . Aortic aneurysm (HCC)   . Arthritis   . Cardiac arrest (HCC)   . Chronic combined systolic and diastolic CHF (congestive heart failure) (HCC) 07/03/2015   Grade 3 diastolic dysfunction.  LVEF 40-45% 05/2015.  Marland Kitchen Depression    SOME DEPRESSION (ON MEDS FOR 2 YRS) WHEN HER MOTHER PASSED  . Patent ductus arteriosus 07/16/2015  . 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 07/22/2015   26 mm Sorin Memo 3D ring annuloplasty with ligation of patent ductus arteriosus and clipping of LA appendage  . Severe mitral regurgitation 07/03/2015  . Shortness of breath dyspnea     Past Surgical History:  Procedure Laterality Date  . CARDIAC CATHETERIZATION N/A 07/08/2015   Procedure: Right/Left Heart Cath and Coronary Angiography;  Surgeon: Marykay Lex, MD;  Location: [facility] INVASIVE CV LAB;  Service: Cardiovascular;  Laterality: N/A;  . CHEST TUBE INSERTION Left 10/21/2015   Procedure: INSERTION left PLEURAL DRAINAGE CATHETER;  Surgeon: Purcell Nails, MD;  Location: [facility] OR;  Service: Thoracic;  Laterality: Left;  . CLIPPING OF ATRIAL APPENDAGE  07/22/2015   Procedure: CLIPPING OF LEFT ATRIAL APPENDAGE;  Surgeon: Purcell Nails, MD;  Location: [facility] OR;  Service: Open Heart Surgery;;  [address]  . COLONOSCOPY    . EP IMPLANTABLE DEVICE N/A 08/15/2015   Procedure: ICD Implant;  Surgeon: Will Jorja Loa, MD;  Location: [facility] INVASIVE CV LAB;  Service: Cardiovascular;  Laterality: N/A;  . MICROLARYNGOSCOPY WITH CO2 LASER AND EXCISION OF VOCAL CORD LESION N/A 11/04/2015   Procedure: MICROLARYNGOSCOPY, RADIESSE VOCAL CORD AUGMENTATION;  Surgeon: Flo Shanks, MD;  Location: [facility] OR;  Service: ENT;  Laterality: N/A;  .  MITRAL VALVE REPAIR N/A 07/22/2015   Procedure: MITRAL VALVE REPAIR;  Surgeon: Purcell Nails, MD;  Location: [facility] OR;  Service: Open Heart Surgery;  Laterality: N/A;  26 Sorin Memo 3D  . OVARIAN CYST REMOVAL    . PATENT DUCTUS ARTERIOUS REPAIR N/A 07/22/2015   Procedure: PATENT DUCTUS ARTERIOSUS (PDA) ligation;  Surgeon: Purcell Nails, MD;  Location: [facility] OR;  Service: Open Heart Surgery;  Laterality: N/A;  . TEE WITHOUT CARDIOVERSION N/A 07/07/2015   Procedure: TRANSESOPHAGEAL ECHOCARDIOGRAM (TEE);  Surgeon: Chilton Si, MD;  Location: [facility] ENDOSCOPY;  Service: Cardiovascular;  Laterality: N/A;  . TEE WITHOUT CARDIOVERSION N/A 07/22/2015   Procedure: TRANSESOPHAGEAL ECHOCARDIOGRAM (TEE);  Surgeon: Purcell Nails, MD;  Location[facility] OR;  Service: Open Heart Surgery;  Laterality: N/A;     Current Outpatient Prescriptions  Medication Sig Dispense Refill  . loperamide (IMODIUM) 2 MG capsule Take by mouth. 1 HOUR PRIOR TO TAKING FUROSEMIDE    . aspirin 81 MG tablet Take 81 mg by mouth daily.    Retia Passe 5 MG TABS tablet TAKE 1 TABLET (5 MG TOTAL) BY MOUTH 2 (TWO) TIMES DAILY WITH A MEAL. 60 tablet 3  . Droxidopa (NORTHERA) 200 MG  CAPS Take 1 capsule by mouth 3 (three) times daily.    . furosemide (LASIX) 20 MG tablet Take 1 tablet (20 mg total) by mouth daily as needed. 30 tablet 3  . midodrine (PROAMATINE) 10 MG tablet Take 1 tablet (10 mg total) by mouth 3 (three) times daily with meals. 270 tablet 3  . potassium chloride SA (K-DUR,KLOR-CON) 20 MEQ tablet Take 1 tablet (20 mEq total) by mouth as needed (when you take lasix). 30 tablet 3   No current facility-administered medications for this visit.     Allergies:   Vicodin [hydrocodone-acetaminophen]; Other; Adhesive [tape]; Cymbalta [duloxetine hcl]; Oxycodone; and Silicone    Social History:  The patient  reports that she has never smoked. She has never used smokeless tobacco. She reports that she does not drink alcohol or use drugs.    Family History:  The patient's family history includes Diabetes in her father; Heart disease in her mother; Hypertension in her mother; Stomach cancer in her father.    ROS:  Please see the history of present illness.  Otherwise, review of systems are positive for none.  All other systems are reviewed and negative.    PHYSICAL EXAM: VS:  BP 108/66   Pulse 76   Ht 5\' 1"  (1.549 m)   Wt 45.3 kg (99 lb 12.8 oz)   BMI 18.86 kg/m  , BMI Body mass index is 18.86 kg/m. GENERAL:  Chronically ill-appearing.  Voice is horse HEENT:  Pupils equal round and reactive, fundi not visualized, oral mucosa unremarkable.  Hoarse voice NECK:  No jugular venous distention, waveform within normal limits, carotid upstroke brisk and symmetric, no bruits LYMPHATICS:  No cervical adenopathy LUNGS:  Diminished at bilateral bases.  No crackles, wheezes or rhonchi HEART:  RRR.  PMI not displaced or sustained,S1 and S2 within normal limits, no S3, no S4, no clicks, no rubs, no murmurs Chest: L pleurex catheter.   ABD:  Mildly distended.  Positive bowel sounds normal in frequency in pitch, no bruits, no rebound, no guarding, no midline pulsatile mass, no hepatomegaly, no splenomegaly EXT:  2 plus pulses throughout, trace edema, no cyanosis no clubbing SKIN:  No rashes no nodules NEURO:  Cranial nerves II through XII grossly intact, motor grossly intact throughout PSYCH:  Cognitively intact, oriented to person place and time  EKG:  EKG is not ordered today.   12/06/15: Sinus rhythm. Rate 78 bpm. Left bundle-branch block.  Left ventricular hypertrophy. Left anterior fascicular block.  11/23/16: Sinus rhythm.  Rate 78 bpm.  LAFB.  QTC 497  Echo 06/04/15: Study Conclusions  - Left ventricle: The cavity size was normal. Wall thickness was increased in a pattern of mild LVH. Systolic function was mildly reduced. The estimated ejection fraction was in the range of 45% to 50%. Abnormal GLPSS at -11%, with  inferior and inferolateral strain abnormality. Basal to mid inferolateral wall hypokinesis. Doppler parameters are consistent with restrictive left ventricular relaxation (grade 3 diastolic dysfunction). The E/A ratio is >2.5. The E/e&' ratio is >20, suggesting markedly elevated LV filling pressure. - Mitral valve: Mildly thickened leaflets with tethering of the posterior leaflet. There is severe, posteriorly directed regurgitation which hugs the atrial free wall. Reversal of flow is seen in the pulmonary vein. - Left atrium: Severely dilated at 52 ml/m2. - Right atrium: The atrium was mildly dilated. - Tricuspid valve: There was mild regurgitation. - Pulmonary arteries: PA peak pressure: 41 mm Hg (S). - Inferior vena cava: The vessel was normal  in size. The respirophasic diameter changes were in the normal range (>= 50%), consistent with normal central venous pressure. - Pericardium, extracardiac: A trivial pericardial effusion was identified posterior to the heart. Features were not consistent with tamponade physiology.  Impressions:  - LVEF 45-50%, mild LVH, abnormal GLPSS at -11% with inferolateral strain and wall motion abnormalities. There is tethering of the posterior mitral leaflet and severe posteriorly directed mitral regurgitation. Restrictive diastolic dysfunction with likely elevated LV filling pressures. Severely dilated LA at 52 ml/m2, mild TR, RVSP elevated at 41 mmHg (consistent with mild pulmonary venous hypertension), trivial posterior pericardial effusion without tamponade physiology.  PFT 11/05/14:  FEV1  1.9 L FVC : 2.4 L FEV1 and FVC are reduced, but the FEV1/FVC ratio is normal.  Following adminitration of bronchodilator there was no significant response.  Consistent with minimal obstructive airway disease.    LHC 07/08/15:  Severe mitral regurgitation with large V waves and 4+ MR  There is mild to moderate left  ventricular systolic dysfunction. With cardiac output/index moderate-severely reduced.  Angiographically normal coronary arteries  Cardiac MRI 08/14/15: IMPRESSION: 1) Mildly asymmetric septal hypertrophy 15 mm. Distal septal apical hypokinesis EF 45%  2) Diffuse delayed gadolinium uptake in the subendocardial and mid epicardial areas especially marked in the inferior wall  3) S/P mitral valve repair with no residual MR and annuloplasty ring  4) Atri-Clip of the LAA with no flow  5) No residual PDA flow  6) Mild LAE  7) Small pericardial effusion  8) Large left pleural effusion  Note delayed gadolinium findings may be consistent with myocarditis vs infiltrative cardiomyopathy  Echo 09/04/15: Study Conclusions  - Left ventricle: The cavity size was normal. There was mild concentric hypertrophy. Systolic function was moderately reduced. The estimated ejection fraction was in the range of 35% to 40%. Diffuse hypokinesis. - Aortic valve: Trileaflet; mildly thickened, mildly calcified leaflets. There was trivial regurgitation. - Mitral valve: S/P mitral valve repair with annuloplasty ring. The findings are consistent with mild stenosis. Valve area by continuity equation (using LVOT flow): 1.34 cm^2. - Tricuspid valve: There was mild regurgitation. - Pulmonary arteries: PA peak pressure: 39 mm Hg (S). - Pericardium, extracardiac: There was a left pleural effusion.  Impressions:  - The right ventricular systolic pressure was increased consistent with mild pulmonary hypertension.   Recent Labs: 11/23/2016: ALT 28; BUN 18; Creat 0.72; Hemoglobin 13.9; Platelets 223; Potassium 4.2; Sodium 136; TSH 3.47    Lipid Panel    Component Value Date/Time   CHOL 181 09/04/2015 0000      Wt Readings from Last 3 Encounters:  02/04/17 45.3 kg (99 lb 12.8 oz)  12/20/16 43.7 kg (96 lb 6.4 oz)  12/08/16 44.9 kg (99 lb)      ASSESSMENT AND PLAN:  # Chronic  systolic and diastolic heart failure:  # Autonomic failure: Ms. Vanderploeg is volume overloaded today.  She has been hesitant to take Lasix due to diarrhea.  Her fluid levels have been elevated as well. I recommended that she start taking Immodium 2mg  daily as needed one hour prior to taking Lasix. She agreed.  Continue ivabradine and northera and midodrine. She will continue to follow-up with Dr. Read Drivers and will undergo endomyocardial biopsy when she is deemed stable.  BP will not tolerate beta blocker or ACE-I/ARB/ARNI.  She will follow up with Dr. Gala Romney in heart failure clinic. It does not seem necessary for her to follow-up with him, myself, Dr. Read Drivers, and the genetic cardiologist. Therefore  she will follow-up with me as needed.  # Mitral regurgitation s/p mitral valve repair:  Annuloplasty ring was stable on 08/2015. Continue aspirin 81mg  daily.    # Paroxysmal atrial fibrillation: Ms. Mccamy remains in sinus rhythm.  LA appendage was clipped in surgery.  There was no flow in the LAA on cardiac MRI.  She had an episode of atrial fibrillation after being shocked for VT/VF.  Seven day event monitor did not reveal any atrial fibrillation.  Therefore warfarin was discontinued.   # Pleural effusion: Drainage has increased to 300 mL's. Restart Lasix as above.  This is managed by Dr. Freida Busman.  # ICD: Managed by Dr. Elberta Fortis.  Optivol indicated increasing volume overload and lasix was appropriately restarted.    Current medicines are reviewed at length with the patient today.  The patient does not have concerns regarding medicines.  The following changes have been made:  Restart lasix.   Labs/ tests ordered today include:   No orders of the defined types were placed in this encounter.   Disposition:   FU with Rosemary Pentecost C. Duke Salvia, MD as needed.    Signed, Chilton Si, MD  02/04/2017 1:26 PM    [facility]

## 2017-02-17 ENCOUNTER — Ambulatory Visit (INDEPENDENT_AMBULATORY_CARE_PROVIDER_SITE_OTHER): Payer: BLUE CROSS/BLUE SHIELD | Admitting: *Deleted

## 2017-02-17 DIAGNOSIS — I469 Cardiac arrest, cause unspecified: Secondary | ICD-10-CM | POA: Diagnosis not present

## 2017-02-17 NOTE — Progress Notes (Signed)
Remote ICD transmission.   

## 2017-02-18 ENCOUNTER — Encounter: Payer: Self-pay | Admitting: Cardiology

## 2017-02-28 ENCOUNTER — Telehealth (HOSPITAL_COMMUNITY): Payer: Self-pay | Admitting: Vascular Surgery

## 2017-02-28 NOTE — Telephone Encounter (Signed)
Returned pt call to make f/u appt 

## 2017-03-14 ENCOUNTER — Encounter: Payer: Self-pay | Admitting: Cardiology

## 2017-03-21 ENCOUNTER — Other Ambulatory Visit (HOSPITAL_COMMUNITY): Payer: Self-pay | Admitting: Internal Medicine

## 2017-03-28 LAB — CUP PACEART REMOTE DEVICE CHECK
Battery Voltage: 3.01 V
Brady Statistic AP VP Percent: 0 %
Brady Statistic AS VS Percent: 99.9 %
Brady Statistic RV Percent Paced: 0.03 %
HIGH POWER IMPEDANCE MEASURED VALUE: 45 Ohm
Implantable Lead Implant Date: 20170127
Implantable Lead Location: 753859
Implantable Pulse Generator Implant Date: 20170127
Lead Channel Impedance Value: 285 Ohm
Lead Channel Impedance Value: 361 Ohm
Lead Channel Pacing Threshold Pulse Width: 0.4 ms
Lead Channel Pacing Threshold Pulse Width: 0.4 ms
Lead Channel Sensing Intrinsic Amplitude: 7 mV
Lead Channel Setting Pacing Amplitude: 1.5 V
Lead Channel Setting Pacing Amplitude: 2 V
Lead Channel Setting Pacing Pulse Width: 0.4 ms
Lead Channel Setting Sensing Sensitivity: 0.3 mV
MDC IDC LEAD IMPLANT DT: 20170127
MDC IDC LEAD LOCATION: 753860
MDC IDC MSMT BATTERY REMAINING LONGEVITY: 122 mo
MDC IDC MSMT LEADCHNL RA PACING THRESHOLD AMPLITUDE: 0.75 V
MDC IDC MSMT LEADCHNL RA SENSING INTR AMPL: 0.875 mV
MDC IDC MSMT LEADCHNL RA SENSING INTR AMPL: 0.875 mV
MDC IDC MSMT LEADCHNL RV IMPEDANCE VALUE: 361 Ohm
MDC IDC MSMT LEADCHNL RV PACING THRESHOLD AMPLITUDE: 0.75 V
MDC IDC MSMT LEADCHNL RV SENSING INTR AMPL: 7 mV
MDC IDC SESS DTM: 20180802073523
MDC IDC STAT BRADY AP VS PERCENT: 0.07 %
MDC IDC STAT BRADY AS VP PERCENT: 0.03 %
MDC IDC STAT BRADY RA PERCENT PACED: 0.07 %

## 2017-04-02 ENCOUNTER — Other Ambulatory Visit (HOSPITAL_COMMUNITY): Payer: Self-pay | Admitting: Internal Medicine

## 2017-04-14 ENCOUNTER — Encounter (HOSPITAL_COMMUNITY): Payer: Self-pay | Admitting: Internal Medicine

## 2017-04-14 ENCOUNTER — Ambulatory Visit (HOSPITAL_COMMUNITY)
Admission: RE | Admit: 2017-04-14 | Discharge: 2017-04-14 | Disposition: A | Discharge status: Home / Self Care | Payer: BLUE CROSS/BLUE SHIELD | Source: Ambulatory Visit | Attending: Internal Medicine | Admitting: Internal Medicine

## 2017-04-14 VITALS — Temp 98.2°F

## 2017-04-14 DIAGNOSIS — I340 Nonrheumatic mitral (valve) insufficiency: Secondary | ICD-10-CM | POA: Diagnosis not present

## 2017-04-14 DIAGNOSIS — R42 Dizziness and giddiness: Secondary | ICD-10-CM | POA: Diagnosis not present

## 2017-04-14 DIAGNOSIS — I5042 Chronic combined systolic (congestive) and diastolic (congestive) heart failure: Secondary | ICD-10-CM | POA: Insufficient documentation

## 2017-04-14 DIAGNOSIS — I480 Paroxysmal atrial fibrillation: Secondary | ICD-10-CM | POA: Insufficient documentation

## 2017-04-14 DIAGNOSIS — I454 Nonspecific intraventricular block: Secondary | ICD-10-CM | POA: Insufficient documentation

## 2017-04-14 DIAGNOSIS — F419 Anxiety disorder, unspecified: Secondary | ICD-10-CM | POA: Diagnosis not present

## 2017-04-14 DIAGNOSIS — E859 Amyloidosis, unspecified: Secondary | ICD-10-CM

## 2017-04-14 DIAGNOSIS — Z952 Presence of prosthetic heart valve: Secondary | ICD-10-CM | POA: Diagnosis not present

## 2017-04-14 DIAGNOSIS — J90 Pleural effusion, not elsewhere classified: Secondary | ICD-10-CM

## 2017-04-14 DIAGNOSIS — Z7982 Long term (current) use of aspirin: Secondary | ICD-10-CM | POA: Insufficient documentation

## 2017-04-14 DIAGNOSIS — I252 Old myocardial infarction: Secondary | ICD-10-CM | POA: Diagnosis not present

## 2017-04-14 DIAGNOSIS — G8929 Other chronic pain: Secondary | ICD-10-CM | POA: Diagnosis not present

## 2017-04-14 DIAGNOSIS — F329 Major depressive disorder, single episode, unspecified: Secondary | ICD-10-CM | POA: Insufficient documentation

## 2017-04-14 DIAGNOSIS — Z7901 Long term (current) use of anticoagulants: Secondary | ICD-10-CM | POA: Insufficient documentation

## 2017-04-14 DIAGNOSIS — I951 Orthostatic hypotension: Secondary | ICD-10-CM | POA: Diagnosis not present

## 2017-04-14 DIAGNOSIS — Z9889 Other specified postprocedural states: Secondary | ICD-10-CM | POA: Diagnosis not present

## 2017-04-14 DIAGNOSIS — Z95810 Presence of automatic (implantable) cardiac defibrillator: Secondary | ICD-10-CM | POA: Insufficient documentation

## 2017-04-14 DIAGNOSIS — I429 Cardiomyopathy, unspecified: Secondary | ICD-10-CM | POA: Diagnosis not present

## 2017-04-14 NOTE — Progress Notes (Signed)
[name], female   DOB: 09-Jan-1961, 56 y.o.   MRN: [id number]   [facility] CLINIC NOTE   Date:  04/14/2017   ID:  Diane [name], DOB 04/13/1961, MRN [id number]  PCP:  Darrow Bussing, MD  Cardiologist:   Dr. Duke Salvia Electrophysiologist: Dr. Berton Mount   [name] is a 56 y.o. female who is referred to the HF Clinic by Dr. Duke Salvia for further evaluation of her chronic systolic and diastolic heart failure and possible infiltrative CM.  Says her symptoms started about 2 years ago with syncope and DOE. Finally referred to Dr. [facility] Salvia in 12/16. Echocardiography revealed severe mitral regurgitation and a reduced LVEF (45-50%).  The MR was due to tethering of the posterior leaflet.  She was also noted to have grade 3 diastolic dysfunction with mid basal to mid inferolateral wall hypokinesis.  On 07/22/15 underwent repair of her mitral valve with a 26 mm Sorin Memo 3D annuloplasty ring on 07/22/15 by Dr. Cornelius Moras.  Intra-operatively she was noted to have a PDA with left to right shunting that was ligated in the OR. The left atrium was also clipped.   Her post operative course was initially unremarkable and she was discharged on post-op day 5.    On 08/05/15 she suffered a cardiac arrest at home and was resuscitated by EMS.  The initial rhythm was unclear but she had VT upon arrival in the ED, which was followed by atrial fibrillation.  She was treated with hypothermia and had prolonged hospital course. Fortunately she has no residual cognitive deficits.  During that hospitalization she underwent cardiac MRI with LVEF 45% with hypokinesis of the distal septum and apex.  There was diffuse, delayed enhancement in the subendocardium and mid epicardial region, most prominent in the inferior wall.  This was concerning for myocarditis or infiltrative cardiomyopathy.  Her EKG showed QT prolongation and she had an ICD placed for secondary prevention.  Her hospitalization was delayed by  severe orthostatic hypotension and recurrent syncope requiring high doses of florinef and midodrine which were weaned down.  She was discharged home but was readmitted with volume overload on 2/16.  Echo showed LVEF 35% with diffuse hypokinesis and her mitral valve was functioning well.  She also underwent L thoracentesis with removal of 1.4L and received IV lasix.  She was discharged home on 2/16 and followed up in clinic on 2/23.  At that time she reported some dizziness and was hypotensive to 84/58.   She underwent Pleurex catheter placement for recurrent left pleural effusion on 10/20/16.   We referred her to Dr. Erenest Rasher at [facility] and underwent Tilt Table testing 05/2016 that revealed autonomic failure.  Her BP dropped to 55/32.  She was started on mestanon but has been unable to tolerate it due to diarrhea.  She was also referred for a genetic evaluation given her long QT and SCD.  Diane [name] is a 56 y.o. female presents today for follow up.  Did have dizziness with standing in lobby. Likely 2/2 to her known autonomic failure. She has felt very stiff and "achey" since her cMRI yesterday at [facility]. She slept very poorly. Poor appetite. She has chronic pain and a chronic poor appetite, but states it was worse this am. She takes lasix as needed. Last taken about a week ago. She reports SOB with minimal activity. Denies chest pain, but does have chest pressure when she is short of breath. She continues to drain from her Pleurex cath 250-356mL  three times a week. She c/o parasethesias in hands and feet that have gradually worsened.   [facility]MRI 04/13/17 at [facility]  1. The left ventricle is small in cavity size, with severe concentric hypertrophy and max. wall thickness 1.9 cm. Global systolic function is moderately reduced. The LV ejection fraction is 38%. There is diffuse hypokinesia. 2. The right ventricle is normal in cavity size, with moderate hypertrophy. Global systolic function is moderately  reduced, the RVEF is 32%. There is an RV lead seen ending in the RV apex. 3. Both atria are normal in size, with markedly reduced contractile function. An RA lead was seen. 4. The aortic valve is trileaflet in morphology. There is no significant aortic valve stenosis or regurgitation. There is a bioprosthetic valve in mitral position, which appears well seated and without significant stenosis or insufficiency. There is mild tricuspid and pulmonic valve insufficiency.  5. Delayed enhancement imaging demonstrates diffuse myocardial as well as atrial enhancement concerning for infiltrative disease such as amyloid.  6. No LV thrombus seen.   Past Medical History:  Diagnosis Date  . AICD (automatic cardioverter/defibrillator) present   . Anemia   . Anxiety   . Aortic aneurysm (HCC)   . Arthritis   . Cardiac arrest (HCC)   . Chronic combined systolic and diastolic CHF (congestive heart failure) (HCC) 07/03/2015   Grade 3 diastolic dysfunction.  LVEF 40-45% 05/2015.  Marland Kitchen Depression    SOME DEPRESSION (ON MEDS FOR 2 YRS) WHEN HER MOTHER PASSED  . Patent ductus arteriosus 07/16/2015  . 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 07/22/2015   26 mm Sorin Memo 3D ring annuloplasty with ligation of patent ductus arteriosus and clipping of LA appendage  . Severe mitral regurgitation 07/03/2015  . Shortness of breath dyspnea     Past Surgical History:  Procedure Laterality Date  . CARDIAC CATHETERIZATION N/A 07/08/2015   Procedure: Right/Left Heart Cath and Coronary Angiography;  Surgeon: Marykay Lex, MD;  Location: [facility] INVASIVE CV LAB;  Service: Cardiovascular;  Laterality: N/A;  . CHEST TUBE INSERTION Left 10/21/2015   Procedure: INSERTION left PLEURAL DRAINAGE CATHETER;  Surgeon: Purcell Nails, MD;  Location: [facility] OR;  Service: Thoracic;  Laterality: Left;  . CLIPPING OF ATRIAL APPENDAGE  07/22/2015   Procedure: CLIPPING OF LEFT ATRIAL APPENDAGE;  Surgeon: Purcell Nails,  MD;  Location: [facility] OR;  Service: Open Heart Surgery;;  [address]  . COLONOSCOPY    . EP IMPLANTABLE DEVICE N/A 08/15/2015   Procedure: ICD Implant;  Surgeon: Will Jorja Loa, MD;  Location: [facility] INVASIVE CV LAB;  Service: Cardiovascular;  Laterality: N/A;  . MICROLARYNGOSCOPY WITH CO2 LASER AND EXCISION OF VOCAL CORD LESION N/A 11/04/2015   Procedure: MICROLARYNGOSCOPY, RADIESSE VOCAL CORD AUGMENTATION;  Surgeon: Flo Shanks, MD;  Location: [facility] OR;  Service: ENT;  Laterality: N/A;  . MITRAL VALVE REPAIR N/A 07/22/2015   Procedure: MITRAL VALVE REPAIR;  Surgeon: Purcell Nails, MD;  Location: [facility] OR;  Service: Open Heart Surgery;  Laterality: N/A;  26 Sorin Memo 3D  . OVARIAN CYST REMOVAL    . PATENT DUCTUS ARTERIOUS REPAIR N/A 07/22/2015   Procedure: PATENT DUCTUS ARTERIOSUS (PDA) ligation;  Surgeon: Purcell Nails, MD;  Location: [facility] OR;  Service: Open Heart Surgery;  Laterality: N/A;  . TEE WITHOUT CARDIOVERSION N/A 07/07/2015   Procedure: TRANSESOPHAGEAL ECHOCARDIOGRAM (TEE);  Surgeon: Chilton Si, MD;  Location: [facility] ENDOSCOPY;  Service: Cardiovascular;  Laterality: N/A;  . TEE WITHOUT CARDIOVERSION  N/A 07/22/2015   Procedure: TRANSESOPHAGEAL ECHOCARDIOGRAM (TEE);  Surgeon: Purcell Nails, MD;  Location: [facility] OR;  Service: Open Heart Surgery;  Laterality: N/A;     Current Outpatient Prescriptions  Medication Sig Dispense Refill  . aspirin 81 MG tablet Take 81 mg by mouth daily.    Retia Passe 5 MG TABS tablet TAKE 1 TABLET (5 MG TOTAL) BY MOUTH 2 (TWO) TIMES DAILY WITH A MEAL. 60 tablet 3  . Droxidopa (NORTHERA) 200 MG CAPS Take 1 capsule by mouth 3 (three) times daily.    . midodrine (PROAMATINE) 10 MG tablet Take 1 tablet (10 mg total) by mouth 3 (three) times daily with meals. 270 tablet 3  . furosemide (LASIX) 20 MG tablet TAKE 1 TABLET (20 MG TOTAL) BY MOUTH DAILY AS NEEDED. (Patient not taking: Reported on 04/14/2017) 30 tablet 3  . KLOR-CON M20 20 MEQ tablet TAKE 1 TABLET (20 MEQ  TOTAL) BY MOUTH AS NEEDED (WHEN YOU TAKE LASIX). (Patient not taking: Reported on 04/14/2017) 30 tablet 3  . loperamide (IMODIUM) 2 MG capsule Take by mouth. 1 HOUR PRIOR TO TAKING FUROSEMIDE     No current facility-administered medications for this encounter.     Allergies:   Vicodin [hydrocodone-acetaminophen]; Other; Adhesive [tape]; Cymbalta [duloxetine hcl]; Oxycodone; and Silicone    Social History:  The patient  reports that she has never smoked. She has never used smokeless tobacco. She reports that she does not drink alcohol or use drugs.   Family History:  The patient's family history includes Diabetes in her father; Heart disease in her mother; Hypertension in her mother; Stomach cancer in her father.   Review of systems complete and found to be negative unless listed in HPI.    PHYSICAL EXAM:  Vitals:   04/14/17 1402  Temp: 98.2 F (36.8 C)  SpO2: 100%   Wt Readings from Last 3 Encounters:  02/04/17 99 lb 12.8 oz (45.3 kg)  12/20/16 96 lb 6.4 oz (43.7 kg)  12/08/16 99 lb (44.9 kg)   General: Fatigued. Thin. Chronically ill appearing  Raspy voice.  HEENT: Normal Neck: Supple. JVP 11-12 cm. Carotids 2+ bilat; no bruits. No thyromegaly or nodule noted. Cor: PMI nondisplaced. RRR, No M/G/R noted Lungs: Diminished, dull 1/2 up L lung. Pleurxe in place.  Abdomen: Soft, non-tender, non-distended, no HSM. No bruits or masses. +BS  Extremities: No cyanosis, clubbing, or rash. Warn. R and LLE no edema.  Neuro: Alert & orientedx3, cranial nerves grossly intact. moves all 4 extremities w/o difficulty. Affect pleasant   PFT 11/05/14:  FEV1  1.9 L FVC : 2.4 L FEV1 and FVC are reduced, but the FEV1/FVC ratio is normal.  Following adminitration of bronchodilator there was no significant response.  Consistent with minimal obstructive airway disease.    LHC 07/08/15:  Severe mitral regurgitation with large V waves and 4+ MR  There is mild to moderate left ventricular systolic  dysfunction. With cardiac output/index moderate-severely reduced.  Angiographically normal coronary arteries   Recent Labs: 11/23/2016: ALT 28; BUN 18; Creat 0.72; Hemoglobin 13.9; Platelets 223; Potassium 4.2; Sodium 136; TSH 3.47    Lipid Panel    Component Value Date/Time   CHOL 181 09/04/2015 0000      Wt Readings from Last 3 Encounters:  02/04/17 99 lb 12.8 oz (45.3 kg)  12/20/16 96 lb 6.4 oz (43.7 kg)  12/08/16 99 lb (44.9 kg)      ASSESSMENT AND PLAN:  1) Chronic systolic and diastolic heart failure: -  EF 30-35% 12/07/16 - cMRI 04/13/17 with LVEF 38%. S/p ICD for SCD. Also with diffuse delaying enhancement concerning for infiltrative disease.  - Dr. Gala Romney discussed with [facility]. Will plan for PYP scan to look for amyloid. Consider treatment if +, if negative would need to proceed with biopsy.  - NYHA III symptoms at baseline.  - Volume status appears elevated by CVP, though not on optivol.  - Encouraged lasix as needed and to take immodium for diarrhea. Instructed her to drain her pleurex, and take lasix for the next 2-3 days.  - Continue midodrine and Northera for BP support.  - Continue corlanor  BID - unable to tolerate higher dose  2) Recurrent left pleural effusion - Pleurex catheter continues to drain. Followed by Dr. Cornelius Moras and [facility].   3) Mitral regurgitation s/p mitral valve repair:  Stable s/p MV repair  4) Paroxysmal atrial fibrillation:  - EKG today shows NSR 86 bpm.  LA appendage was clipped in surgery. - The patients CHA2DS2-VASc Score and unadjusted Ischemic Stroke Rate (% per year) is equal to 2.2 % stroke rate/year from a score of 2 .  - Continue warfarin.   5) Orthostatic hypotension:  --She has been evaluated by Dr. Read Drivers at [facility] with markedly + tilt table. - Continue current regimen. She could not tolerate mestinon.   Plan for PYP scan in next 3-4 weeks after recovering from cMRI.  Tentatively follow up 2 months to review results.    Graciella Freer, PA-C  04/14/2017 2:07 PM     Patient seen and examined with the above-signed Advanced Practice Provider and/or Housestaff. I personally reviewed laboratory data, imaging studies and relevant notes. I independently examined the patient and formulated the important aspects of the plan. I have edited the note to reflect any of my changes or salient points. I have personally discussed the plan with the patient and/or family.  Continues to do poorly with NYHA IIIB HF symptoms. cMRI from [facility] reviewed personally. EF 38% with diffuse LGE suggestive of infiltrative CM. Previous SPEP/UPEP negative. I discussed with Dr. Mills-Frazier at [facility] and will proceed with PYP scanning to assess for TTR amyloid. If PYP + will need genetic test for TTR. If negative will need EM biopsy and genetic evaluation for possible LAMA cardiomyopathy. Overall I am worried her prognosis is increasingly poor.   Continue Pleurex catheter for effusion. Maintaining NSR.   Orthostatic hypotension stable on current regimen. HF medications limited by low BP.   Total time spent 45 minutes. Over half that time spent discussing above.    Arvilla Meres, MD  3:35 PM

## 2017-04-14 NOTE — Patient Instructions (Signed)
You have been referred to have a nuclear med   Your physician recommends that you schedule a follow-up appointment in: 2 months

## 2017-04-22 ENCOUNTER — Other Ambulatory Visit: Payer: Self-pay | Admitting: Internal Medicine

## 2017-04-25 ENCOUNTER — Ambulatory Visit (HOSPITAL_COMMUNITY)
Admission: RE | Admit: 2017-04-25 | Discharge: 2017-04-25 | Disposition: A | Discharge status: Home / Self Care | Payer: BLUE CROSS/BLUE SHIELD | Source: Ambulatory Visit | Attending: Internal Medicine | Admitting: Internal Medicine

## 2017-04-25 ENCOUNTER — Encounter (HOSPITAL_COMMUNITY): Payer: BLUE CROSS/BLUE SHIELD

## 2017-04-25 ENCOUNTER — Encounter (HOSPITAL_COMMUNITY): Payer: Self-pay

## 2017-04-25 DIAGNOSIS — E859 Amyloidosis, unspecified: Secondary | ICD-10-CM

## 2017-04-28 ENCOUNTER — Encounter (HOSPITAL_COMMUNITY): Payer: BLUE CROSS/BLUE SHIELD

## 2017-04-28 ENCOUNTER — Encounter (HOSPITAL_COMMUNITY)
Admission: RE | Admit: 2017-04-28 | Discharge: 2017-04-28 | Disposition: A | Discharge status: Home / Self Care | Payer: BLUE CROSS/BLUE SHIELD | Source: Ambulatory Visit | Attending: Internal Medicine | Admitting: Internal Medicine

## 2017-04-28 ENCOUNTER — Ambulatory Visit (HOSPITAL_COMMUNITY)
Admission: RE | Admit: 2017-04-28 | Discharge: 2017-04-28 | Disposition: A | Discharge status: Home / Self Care | Payer: BLUE CROSS/BLUE SHIELD | Source: Ambulatory Visit | Attending: Internal Medicine | Admitting: Internal Medicine

## 2017-04-28 DIAGNOSIS — E859 Amyloidosis, unspecified: Secondary | ICD-10-CM | POA: Insufficient documentation

## 2017-04-28 MED ORDER — TECHNETIUM TC 99M PYROPHOSPHATE
20.0000 | Freq: Once | INTRAVENOUS | Status: DC
  Administered 2017-04-28: 20 via INTRAVENOUS

## 2017-05-19 ENCOUNTER — Ambulatory Visit (INDEPENDENT_AMBULATORY_CARE_PROVIDER_SITE_OTHER): Payer: BLUE CROSS/BLUE SHIELD | Admitting: *Deleted

## 2017-05-19 DIAGNOSIS — I420 Dilated cardiomyopathy: Secondary | ICD-10-CM

## 2017-05-20 ENCOUNTER — Encounter: Payer: Self-pay | Admitting: Cardiology

## 2017-05-20 NOTE — Progress Notes (Signed)
Remote ICD transmission.   

## 2017-06-15 ENCOUNTER — Encounter (HOSPITAL_COMMUNITY): Payer: Self-pay | Admitting: Internal Medicine

## 2017-06-15 ENCOUNTER — Ambulatory Visit (HOSPITAL_COMMUNITY)
Admission: RE | Admit: 2017-06-15 | Discharge: 2017-06-15 | Disposition: A | Discharge status: Home / Self Care | Payer: BLUE CROSS/BLUE SHIELD | Source: Ambulatory Visit | Attending: Internal Medicine | Admitting: Internal Medicine

## 2017-06-15 VITALS — BP 124/86 | HR 85 | Wt 92.8 lb

## 2017-06-15 DIAGNOSIS — E854 Organ-limited amyloidosis: Secondary | ICD-10-CM | POA: Diagnosis not present

## 2017-06-15 DIAGNOSIS — Z808 Family history of malignant neoplasm of other organs or systems: Secondary | ICD-10-CM | POA: Insufficient documentation

## 2017-06-15 DIAGNOSIS — Z952 Presence of prosthetic heart valve: Secondary | ICD-10-CM | POA: Diagnosis not present

## 2017-06-15 DIAGNOSIS — J90 Pleural effusion, not elsewhere classified: Secondary | ICD-10-CM

## 2017-06-15 DIAGNOSIS — Z9889 Other specified postprocedural states: Secondary | ICD-10-CM | POA: Diagnosis not present

## 2017-06-15 DIAGNOSIS — I43 Cardiomyopathy in diseases classified elsewhere: Secondary | ICD-10-CM | POA: Diagnosis not present

## 2017-06-15 DIAGNOSIS — Z7901 Long term (current) use of anticoagulants: Secondary | ICD-10-CM | POA: Diagnosis not present

## 2017-06-15 DIAGNOSIS — Z7982 Long term (current) use of aspirin: Secondary | ICD-10-CM | POA: Insufficient documentation

## 2017-06-15 DIAGNOSIS — I951 Orthostatic hypotension: Secondary | ICD-10-CM | POA: Diagnosis not present

## 2017-06-15 DIAGNOSIS — Z833 Family history of diabetes mellitus: Secondary | ICD-10-CM | POA: Diagnosis not present

## 2017-06-15 DIAGNOSIS — Z79899 Other long term (current) drug therapy: Secondary | ICD-10-CM | POA: Insufficient documentation

## 2017-06-15 DIAGNOSIS — I480 Paroxysmal atrial fibrillation: Secondary | ICD-10-CM | POA: Insufficient documentation

## 2017-06-15 DIAGNOSIS — Z8249 Family history of ischemic heart disease and other diseases of the circulatory system: Secondary | ICD-10-CM | POA: Diagnosis not present

## 2017-06-15 DIAGNOSIS — I5022 Chronic systolic (congestive) heart failure: Secondary | ICD-10-CM | POA: Diagnosis not present

## 2017-06-15 DIAGNOSIS — I5042 Chronic combined systolic (congestive) and diastolic (congestive) heart failure: Secondary | ICD-10-CM | POA: Insufficient documentation

## 2017-06-15 LAB — CUP PACEART REMOTE DEVICE CHECK
Battery Remaining Longevity: 120 mo
Battery Voltage: 3.01 V
Brady Statistic AP VP Percent: 0 %
Brady Statistic AP VS Percent: 0.17 %
Brady Statistic RA Percent Paced: 0.17 %
Brady Statistic RV Percent Paced: 0.03 %
Date Time Interrogation Session: 20181101073322
HIGH POWER IMPEDANCE MEASURED VALUE: 44 Ohm
Implantable Lead Implant Date: 20170127
Implantable Lead Location: 753859
Implantable Lead Location: 753860
Implantable Lead Model: 5076
Implantable Pulse Generator Implant Date: 20170127
Lead Channel Impedance Value: 247 Ohm
Lead Channel Impedance Value: 361 Ohm
Lead Channel Pacing Threshold Amplitude: 0.625 V
Lead Channel Pacing Threshold Amplitude: 0.75 V
Lead Channel Pacing Threshold Pulse Width: 0.4 ms
Lead Channel Sensing Intrinsic Amplitude: 0.875 mV
Lead Channel Setting Pacing Amplitude: 2 V
Lead Channel Setting Sensing Sensitivity: 0.3 mV
MDC IDC LEAD IMPLANT DT: 20170127
MDC IDC MSMT LEADCHNL RA IMPEDANCE VALUE: 361 Ohm
MDC IDC MSMT LEADCHNL RA SENSING INTR AMPL: 0.875 mV
MDC IDC MSMT LEADCHNL RV PACING THRESHOLD PULSEWIDTH: 0.4 ms
MDC IDC MSMT LEADCHNL RV SENSING INTR AMPL: 6.375 mV
MDC IDC MSMT LEADCHNL RV SENSING INTR AMPL: 6.375 mV
MDC IDC SET LEADCHNL RA PACING AMPLITUDE: 1.5 V
MDC IDC SET LEADCHNL RV PACING PULSEWIDTH: 0.4 ms
MDC IDC STAT BRADY AS VP PERCENT: 0.03 %
MDC IDC STAT BRADY AS VS PERCENT: 99.8 %

## 2017-06-15 MED ORDER — TORSEMIDE 20 MG PO TABS: 20.0000 mg | ORAL_TABLET | Freq: Every day | ORAL | 3 refills | Status: DC

## 2017-06-15 NOTE — Progress Notes (Addendum)
[name], female   DOB: 15-Nov-1960, 56 y.o.   MRN: [id number]   ADVANCED HF CLINIC NOTE   Date:  06/15/2017   ID:  Diane [name], DOB [birth year], MRN [id number]  PCP:  Darrow Bussing, MD  Cardiologist:   Dr. Duke Salvia Electrophysiologist: Dr. Berton Mount   [name] is a 56 y.o. female who is referred to the HF Clinic by Dr. Duke Salvia for further evaluation of her chronic systolic and diastolic heart failure and possible infiltrative CM.  Says her symptoms started about 2 years ago with syncope and DOE. Finally referred to Dr. Duke Salvia in 12/16. Echocardiography revealed severe mitral regurgitation and a reduced LVEF (45-50%).  The MR was due to tethering of the posterior leaflet.  She was also noted to have grade 3 diastolic dysfunction with mid basal to mid inferolateral wall hypokinesis.  On 07/22/15 underwent repair of her mitral valve with a 26 mm Sorin Memo 3D annuloplasty ring on 07/22/15 by Dr. Cornelius Moras.  Intra-operatively she was noted to have a PDA with left to right shunting that was ligated in the OR. The left atrial appendage was also clipped.   On 08/05/15 she suffered a cardiac arrest at home and was resuscitated by EMS.  The initial rhythm was unclear but she had VT upon arrival in the ED, which was followed by atrial fibrillation.  She was treated with hypothermia and had prolonged hospital course. Fortunately she has no residual cognitive deficits.  During that hospitalization she underwent cardiac MRI with LVEF 45% with hypokinesis of the distal septum and apex.  There was diffuse, delayed enhancement in the subendocardium and mid epicardial region, most prominent in the inferior wall.  This was concerning for myocarditis or infiltrative cardiomyopathy.  Her EKG showed QT prolongation and she had an ICD placed for secondary prevention.  Her hospitalization was delayed by severe orthostatic hypotension and recurrent syncope requiring high doses of florinef and  midodrine which were weaned down.  She was discharged home but was readmitted with volume overload on 2/16.  Echo showed LVEF 35% with diffuse hypokinesis and her mitral valve was functioning well.    She underwent Pleurex catheter placement for recurrent left pleural effusion on 10/20/16.   We referred her to Dr. Erenest Rasher at [facility] and underwent Tilt Table testing 05/2016 that revealed autonomic failure.  Her BP dropped to 55/32.  She was started on mestanon but has been unable to tolerate it due to diarrhea.  She was also referred for a genetic evaluation given her long QT and SCD.  Repeat cMRI in 9/18 at [facility] showed EF 38% with diffuse myocardial as well as atrial enhancement concerning for infiltrative disease such as amyloid.   Tc-PYP scan 04/28/17 was strongly positive (qualitative = 3, H/CL > 1.5) c/w ATTR-CA. 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 Her family history is also notable for cardiomyopathy in her mother and maternal aunt at age ~36 yo and may suggest they had hereditary ATTR-CM as well.   Saw Dr. Caesar Chestnut at [facility] earlier this week to discuss treatment options and plan was for her to start patisiran here in [place]. Has been feeling weak lately. More SOB and edema. Taking lasix more frequently but says it doesn't work as well. BP has been better. Still draining about 475cc from left pleurex every 2 days. No recent syncope. No ICD shocks. Walks  with can due to neuropathy and weakness.   IDC interrogated personally No VT/AF Optivol trending up Activity 1hr/day  PFT 11/05/14:  FEV1  1.9 L FVC : 2.4 L FEV1 and FVC are reduced, but the FEV1/FVC ratio is normal.  Following adminitration of bronchodilator there was no significant response.  Consistent with minimal obstructive airway disease.    LHC 07/08/15:  Severe mitral  regurgitation with large V waves and 4+ MR  There is mild to moderate left ventricular systolic dysfunction. With cardiac output/index moderate-severely reduced.  Angiographically normal coronary arteries   Past Medical History:  Diagnosis Date  . AICD (automatic cardioverter/defibrillator) present   . Anemia   . Anxiety   . Aortic aneurysm (HCC)   . Arthritis   . Cardiac arrest (HCC)   . Chronic combined systolic and diastolic CHF (congestive heart failure) (HCC) 07/03/2015   Grade 3 diastolic dysfunction.  LVEF 40-45% 05/2015.  Marland Kitchen. Depression    SOME DEPRESSION (ON MEDS FOR 2 YRS) WHEN HER MOTHER PASSED  . Patent ductus arteriosus 07/16/2015  . 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 07/22/2015   26 mm Sorin Memo 3D ring annuloplasty with ligation of patent ductus arteriosus and clipping of LA appendage  . Severe mitral regurgitation 07/03/2015  . Shortness of breath dyspnea     Past Surgical History:  Procedure Laterality Date  . CARDIAC CATHETERIZATION N/A 07/08/2015   Procedure: Right/Left Heart Cath and Coronary Angiography;  Surgeon: Marykay Lexavid W Harding, MD;  [facility];  Service: Cardiovascular;  Laterality: N/A;  . CHEST TUBE INSERTION Left 10/21/2015   Procedure: INSERTION left PLEURAL DRAINAGE CATHETER;  Surgeon: Purcell Nailslarence H Owen, MD;  Location: [facility] OR;  Service: Thoracic;  Laterality: Left;  . CLIPPING OF ATRIAL APPENDAGE  07/22/2015   Procedure: CLIPPING OF LEFT ATRIAL APPENDAGE;  Surgeon: Purcell Nailslarence H Owen, MD;  Location: [facility] OR;  Service: Open Heart Surgery;;  35 [address] Pro  . COLONOSCOPY    . EP IMPLANTABLE DEVICE N/A 08/15/2015   Procedure: ICD Implant;  Surgeon: Will Jorja LoaMartin Camnitz, MD;  Location: [facility] INVASIVE CV LAB;  Service: Cardiovascular;  Laterality: N/A;  . MICROLARYNGOSCOPY WITH CO2 LASER AND EXCISION OF VOCAL CORD LESION N/A 11/04/2015   Procedure: MICROLARYNGOSCOPY, RADIESSE VOCAL CORD AUGMENTATION;  Surgeon: Flo ShanksKarol Wolicki,  MD;  [facility];  Service: ENT;  Laterality: N/A;  . MITRAL VALVE REPAIR N/A 07/22/2015   Procedure: MITRAL VALVE REPAIR;  Surgeon: Purcell Nailslarence H Owen, MD;  Location: [facility] OR;  Service: Open Heart Surgery;  Laterality: N/A;  26 Sorin Memo 3D  . OVARIAN CYST REMOVAL    . PATENT DUCTUS ARTERIOUS REPAIR N/A 07/22/2015   Procedure: PATENT DUCTUS ARTERIOSUS (PDA) ligation;  Surgeon: Purcell Nailslarence H Owen, MD;  Location: [facility] OR;  Service: Open Heart Surgery;  Laterality: N/A;  . TEE WITHOUT CARDIOVERSION N/A 07/07/2015   Procedure: TRANSESOPHAGEAL ECHOCARDIOGRAM (TEE);  Surgeon: Chilton Siiffany Irwindale, MD;  [facility];  Service: Cardiovascular;  Laterality: N/A;  . TEE WITHOUT CARDIOVERSION N/A 07/22/2015   Procedure: TRANSESOPHAGEAL ECHOCARDIOGRAM (TEE);  Surgeon: Purcell Nailslarence H Owen, MD;  [facility];  Service: Open Heart Surgery;  Laterality: N/A;     Current Outpatient Medications  Medication Sig Dispense Refill  . aspirin 81 MG tablet Take 81 mg by mouth daily.    Retia Passe. CORLANOR 5 MG TABS tablet TAKE 1 TABLET (5 MG TOTAL) BY MOUTH 2 (TWO) TIMES DAILY WITH A MEAL. 60 tablet 3  . Droxidopa (NORTHERA)  200 MG CAPS Take 1 capsule by mouth 3 (three) times daily.    . furosemide (LASIX) 20 MG tablet TAKE 1 TABLET (20 MG TOTAL) BY MOUTH DAILY AS NEEDED. 30 tablet 3  . KLOR-CON M20 20 MEQ tablet TAKE 1 TABLET (20 MEQ TOTAL) BY MOUTH AS NEEDED (WHEN YOU TAKE LASIX). 30 tablet 3  . loperamide (IMODIUM) 2 MG capsule Take by mouth. 1 HOUR PRIOR TO TAKING FUROSEMIDE    . midodrine (PROAMATINE) 10 MG tablet Take 1 tablet (10 mg total) by mouth 3 (three) times daily with meals. 270 tablet 3   No current facility-administered medications for this encounter.     Allergies:   Vicodin [hydrocodone-acetaminophen]; Other; Adhesive [tape]; Cymbalta [duloxetine hcl]; Oxycodone; and Silicone    Social History:  The patient  reports that  has never smoked. she has never used smokeless tobacco. She reports that she does not  drink alcohol or use drugs.   Family History:  The patient's family history includes Diabetes in her father; Heart disease in her mother; Hypertension in her mother; Stomach cancer in her father.   Review of systems complete and found to be negative unless listed in HPI.    PHYSICAL EXAM:  Vitals:   06/15/17 1351  BP: 124/86  Pulse: 85  SpO2: 98%  Weight: 92 lb 12.8 oz (42.1 kg)   Wt Readings from Last 3 Encounters:  06/15/17 92 lb 12.8 oz (42.1 kg)  02/04/17 99 lb 12.8 oz (45.3 kg)  12/20/16 96 lb 6.4 oz (43.7 kg)   General: Fatigued. Thin. Chronically ill appearing  Raspy voice. Walks with cane HEENT: Normal anicteric Neck: Supple. JVP to jaw. Carotids 2+ bilat; no bruits. No thyromegaly or nodule noted. Cor: PMI nondisplaced RRR 2/6 TR no rub or gallop Lungs: Clear diminished left base. Pleurex site ok Abdomen: Soft, NT/ND good BS no HSM Extremities: no cyanosis, clubbing, rash, edema Neuro: alert & oriented x 3, cranial nerves grossly intact. moves all 4 extremities w/o difficulty. 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 S/p ICD for SCD. Also with diffuse delaying enhancement concerning for infiltrative disease.  - PYP scan markedly positive for TTR-amyloid.  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 - NYHA III symptoms at baseline. Volume status elevated by exam and Optivol - BP too soft for b-blocker or ACE/ARB/ARNI - Continue ivabradine - Change lasix 20 to torsemide 20 daily. Watch BP  closely.  - Continue midodrine and Northera for BP support.  - Continue corlanor 5mg  BID - unable to tolerate higher dose - She has been seen at the [facility] at [facility] (appreciate their input greatly) and the decision to pursue patisiran therapy in [place] has been made. We discussed cost and logistics of the therapy and will work toward approval for her. She understands that while this therapy can help prevent progression of the disease it will not reverse the damage that has already been done.  - PND score IIIA  2) Recurrent left pleural effusion - Pleurex catheter continues to  drain. Followed by Dr. Cornelius Moras and [facility].  - Hopefully drainage will decrease with more aggressive diuresis  3) Mitral regurgitation s/p mitral valve repair:   - Stable s/p MV repair  4) Paroxysmal atrial fibrillation:  - Currently in NSR. LA appendage was clipped in surgery. - The patients CHA2DS2-VASc Score and unadjusted Ischemic Stroke Rate (% per year) is equal to 2.2 % stroke rate/year from a score of 2 .  - Continue warfarin.   5) Orthostatic hypotension:  --She has been evaluated by Dr. Read Drivers at [facility] with markedly + tilt table. - Continue current regimen. She could not tolerate mestinon.  - Suspect autonomic dysfunction due in aprt to TTR neuropathy.   6) Physical deconditioning, severe  - Refer for HHPT/OT  Total time spent 45 minutes. Over half that time spent discussing above.   Arvilla Meres, MD  06/15/2017 2:26 PM

## 2017-06-15 NOTE — Patient Instructions (Signed)
Stop Furosemide   Start Torsemide 20 mg (1 tab) daily  You have been referred to Home Health PT/OT (will call you)   You have been referred to start medication Patisiran (will call you)  Your physician recommends that you schedule a follow-up appointment in: 2 months with Dr. Gala Romney

## 2017-06-16 ENCOUNTER — Other Ambulatory Visit (HOSPITAL_COMMUNITY): Payer: Self-pay

## 2017-06-16 DIAGNOSIS — I5042 Chronic combined systolic (congestive) and diastolic (congestive) heart failure: Secondary | ICD-10-CM

## 2017-06-20 ENCOUNTER — Telehealth (HOSPITAL_COMMUNITY): Payer: Self-pay | Admitting: *Deleted

## 2017-06-20 MED ORDER — POTASSIUM CHLORIDE 20 MEQ/15ML (10%) PO SOLN: 20.0000 meq | Freq: Every day | ORAL | 3 refills | Status: DC | PRN

## 2017-06-20 NOTE — Telephone Encounter (Signed)
Pt states at appt last week she was told she could get liquid potassium but it was not called in.  apologized for this and sent in liquid kcl.

## 2017-06-27 DIAGNOSIS — I5022 Chronic systolic (congestive) heart failure: Secondary | ICD-10-CM | POA: Diagnosis not present

## 2017-06-29 ENCOUNTER — Other Ambulatory Visit (HOSPITAL_COMMUNITY): Payer: Self-pay | Admitting: Internal Medicine

## 2017-07-04 ENCOUNTER — Telehealth: Payer: Self-pay

## 2017-07-04 NOTE — Telephone Encounter (Signed)
Physical therapy contacted the office today with concerns with "a little bit of blood" coming from her "drain tube" yesterday.  Physical therapy also noted today for it to be clear-yellowish in nature and no concerns.  Just wanting to make sure this was normal.  Left message with physical therapy explaining that a little bit of drainage is normal and no directions are to be taken with this.  Also, to contact the office with any questions or concerns.

## 2017-07-05 ENCOUNTER — Telehealth (HOSPITAL_COMMUNITY): Payer: Self-pay | Admitting: Pharmacist

## 2017-07-05 ENCOUNTER — Other Ambulatory Visit (HOSPITAL_COMMUNITY): Payer: Self-pay | Admitting: *Deleted

## 2017-07-05 MED ORDER — IVABRADINE HCL 5 MG PO TABS: 5.0000 mg | ORAL_TABLET | Freq: Two times a day (BID) | ORAL | 3 refills | Status: DC

## 2017-07-05 NOTE — Telephone Encounter (Signed)
Corlanor 5 mg BID PA approved by [employer] commercial through 07/18/38.   Tyler Deis. Bonnye Fava, PharmD, BCPS, CPP Clinical Pharmacist Pager: [phone] Phone: [phone] 07/05/2017 3:13 PM

## 2017-07-14 ENCOUNTER — Other Ambulatory Visit: Payer: Self-pay | Admitting: Internal Medicine

## 2017-07-26 ENCOUNTER — Telehealth: Payer: Self-pay

## 2017-07-26 NOTE — Telephone Encounter (Signed)
Patient called saying she is changing suppliers (because her old supplier discontinued them) for her PleurX bottles.  She is now using [facility] out of [place].  A new Rx was given and patient is to receive bottles from there from now on.  [facility] Phone- [phone] Fax- [phone]

## 2017-07-26 NOTE — Progress Notes (Signed)
This encounter was created in error - please disregard.

## 2017-08-01 NOTE — Addendum Note (Signed)
Encounter addended by: Dolores Patty, MD on: 08/01/2017 3:28 PM  Actions taken: Sign clinical note

## 2017-08-15 ENCOUNTER — Ambulatory Visit (HOSPITAL_COMMUNITY)
Admission: RE | Admit: 2017-08-15 | Discharge: 2017-08-15 | Disposition: A | Discharge status: Home / Self Care | Payer: BLUE CROSS/BLUE SHIELD | Source: Ambulatory Visit | Attending: Internal Medicine | Admitting: Internal Medicine

## 2017-08-15 ENCOUNTER — Other Ambulatory Visit: Payer: Self-pay

## 2017-08-15 ENCOUNTER — Encounter (HOSPITAL_COMMUNITY): Payer: Self-pay | Admitting: Internal Medicine

## 2017-08-15 VITALS — BP 119/64 | HR 82 | Wt 95.2 lb

## 2017-08-15 DIAGNOSIS — Z79899 Other long term (current) drug therapy: Secondary | ICD-10-CM | POA: Insufficient documentation

## 2017-08-15 DIAGNOSIS — I43 Cardiomyopathy in diseases classified elsewhere: Secondary | ICD-10-CM

## 2017-08-15 DIAGNOSIS — F419 Anxiety disorder, unspecified: Secondary | ICD-10-CM | POA: Diagnosis not present

## 2017-08-15 DIAGNOSIS — I480 Paroxysmal atrial fibrillation: Secondary | ICD-10-CM | POA: Diagnosis not present

## 2017-08-15 DIAGNOSIS — J90 Pleural effusion, not elsewhere classified: Secondary | ICD-10-CM

## 2017-08-15 DIAGNOSIS — E854 Organ-limited amyloidosis: Secondary | ICD-10-CM | POA: Diagnosis not present

## 2017-08-15 DIAGNOSIS — E859 Amyloidosis, unspecified: Secondary | ICD-10-CM | POA: Diagnosis not present

## 2017-08-15 DIAGNOSIS — G629 Polyneuropathy, unspecified: Secondary | ICD-10-CM | POA: Insufficient documentation

## 2017-08-15 DIAGNOSIS — I5042 Chronic combined systolic (congestive) and diastolic (congestive) heart failure: Secondary | ICD-10-CM

## 2017-08-15 DIAGNOSIS — Z95810 Presence of automatic (implantable) cardiac defibrillator: Secondary | ICD-10-CM | POA: Insufficient documentation

## 2017-08-15 DIAGNOSIS — Z9889 Other specified postprocedural states: Secondary | ICD-10-CM | POA: Diagnosis not present

## 2017-08-15 DIAGNOSIS — Z8674 Personal history of sudden cardiac arrest: Secondary | ICD-10-CM | POA: Insufficient documentation

## 2017-08-15 DIAGNOSIS — Z8249 Family history of ischemic heart disease and other diseases of the circulatory system: Secondary | ICD-10-CM | POA: Diagnosis not present

## 2017-08-15 DIAGNOSIS — F329 Major depressive disorder, single episode, unspecified: Secondary | ICD-10-CM | POA: Diagnosis not present

## 2017-08-15 DIAGNOSIS — Z7982 Long term (current) use of aspirin: Secondary | ICD-10-CM | POA: Diagnosis not present

## 2017-08-15 DIAGNOSIS — I951 Orthostatic hypotension: Secondary | ICD-10-CM | POA: Diagnosis not present

## 2017-08-15 DIAGNOSIS — I340 Nonrheumatic mitral (valve) insufficiency: Secondary | ICD-10-CM | POA: Insufficient documentation

## 2017-08-15 DIAGNOSIS — I429 Cardiomyopathy, unspecified: Secondary | ICD-10-CM | POA: Diagnosis not present

## 2017-08-15 LAB — COMPREHENSIVE METABOLIC PANEL
ALBUMIN: 3.3 g/dL — AB (ref 3.5–5.0)
ALK PHOS: 97 U/L (ref 38–126)
ALT: 27 U/L (ref 14–54)
ANION GAP: 10 (ref 5–15)
AST: 56 U/L — ABNORMAL HIGH (ref 15–41)
BUN: 18 mg/dL (ref 6–20)
CO2: 24 mmol/L (ref 22–32)
Calcium: 8.8 mg/dL — ABNORMAL LOW (ref 8.9–10.3)
Chloride: 98 mmol/L — ABNORMAL LOW (ref 101–111)
Creatinine, Ser: 0.8 mg/dL (ref 0.44–1.00)
GFR calc Af Amer: >60 mL/min (ref >60)
GFR calc non Af Amer: >60 mL/min (ref >60)
GLUCOSE: 75 mg/dL (ref 65–99)
POTASSIUM: 4.2 mmol/L (ref 3.5–5.1)
SODIUM: 132 mmol/L — AB (ref 135–145)
Total Bilirubin: 0.7 mg/dL (ref 0.3–1.2)
Total Protein: 7 g/dL (ref 6.5–8.1)

## 2017-08-15 LAB — CBC
HCT: 38.9 % (ref 36.0–46.0)
HEMOGLOBIN: 13 g/dL (ref 12.0–15.0)
MCH: 32.9 pg (ref 26.0–34.0)
MCHC: 33.4 g/dL (ref 30.0–36.0)
MCV: 98.5 fL (ref 78.0–100.0)
Platelets: 265 10*3/uL (ref 150–400)
RBC: 3.95 MIL/uL (ref 3.87–5.11)
RDW: 13.6 % (ref 11.5–15.5)
WBC: 5.2 10*3/uL (ref 4.0–10.5)

## 2017-08-15 LAB — PREALBUMIN: Prealbumin: 13 mg/dL — ABNORMAL LOW (ref 18–38)

## 2017-08-15 NOTE — Progress Notes (Signed)
[name], female   DOB: October 25, 1960, 57 y.o.   MRN: [id number]   ADVANCED HF CLINIC NOTE   Date:  08/15/2017   ID:  Diane [name], DOB [birth year], MRN [id number]  PCP:  Darrow Bussing, MD  Cardiologist:   Dr. Duke Salvia Electrophysiologist: Dr. Berton Mount   [name] is a 57 y.o. female who is referred to the HF Clinic by Dr. Duke Salvia for further evaluation of her chronic systolic and diastolic heart failure and possible infiltrative CM.  Symptoms started in 2016 with syncope and DOE. Referred to Dr. Duke Salvia in 12/16. Echocardiography revealed severe mitral regurgitation and a reduced LVEF (45-50%).  The MR was due to tethering of the posterior leaflet.  She was also noted to have grade 3 diastolic dysfunction with mid basal to mid inferolateral wall hypokinesis.  On 07/22/15 underwent repair of her mitral valve with a 26 mm Sorin Memo 3D annuloplasty ring on 07/22/15 by Dr. Cornelius Moras.  Intra-operatively she was noted to have a PDA with left to right shunting that was ligated in the OR. The left atrial appendage was also clipped.   On 08/05/15 she suffered a cardiac arrest at home and was resuscitated by EMS.  The initial rhythm was unclear but she had VT upon arrival in the ED, which was followed by atrial fibrillation.  She was treated with hypothermia and had prolonged hospital course. Fortunately she has no residual cognitive deficits.  During that hospitalization she underwent cardiac MRI with LVEF 45% with hypokinesis of the distal septum and apex.  There was diffuse, delayed enhancement in the subendocardium and mid epicardial region, most prominent in the inferior wall.  This was concerning for myocarditis or infiltrative cardiomyopathy.  Her EKG showed QT prolongation and she had an ICD placed for secondary prevention.  Her hospitalization was delayed by severe orthostatic hypotension and recurrent syncope requiring high doses of florinef and midodrine which were weaned  down.  She was discharged home but was readmitted with volume overload on 2/16.  Echo showed LVEF 35% with diffuse hypokinesis and her mitral valve was functioning well.    She underwent Pleurex catheter placement for recurrent left pleural effusion on 10/20/16.   We referred her to Dr. Erenest Rasher at [facility] and underwent Tilt Table testing 05/2016 that revealed autonomic failure.  Her BP dropped to 55/32.  She was started on mestanon but has been unable to tolerate it due to diarrhea.  She was also referred for a genetic evaluation given her long QT and SCD.  Repeat [facility]MRI in 9/18 at [facility] showed EF 38% with diffuse myocardial as well as atrial enhancement concerning for infiltrative disease such as amyloid.   Tc-PYP scan 04/28/17 was strongly positive (qualitative = 3, H/CL > 1.5) c/w ATTR-CA. 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 Her family history is also notable for cardiomyopathy in her mother and maternal aunt at age ~66 yo and may suggest they had hereditary ATTR-CM as well.   Pt previously seen by Dr. Carilyn Goodpasture at [facility] and plan was for her to start patisiran here in [place].   Pt presents today for regular follow up. Last seen in 06/15/17. Lasix changed to torsemide. Pt was also referred to PT/OT. Says for last 2 weeks she has not been able to eat. Hard to swallow and having stomach cramps. Food tastes are also very intense. Had gained  5 pounds but now lost it. Peripheral neuropathy also getting worse in hands and legs. Now dropping things. Says she has been swelling but controlled with torsemide 3-5 to,es weekly. Drains pleurex 250-500cc 3x/week.  No recent syncope. No ICD shocks. Walks with cane due to neuropathy and weakness.  Pt was doing HHPT for 2-3 weeks, but she stopped as she did not feel as if it was helping.     PFT 11/05/14:    FEV1  1.9 L FVC : 2.4 L FEV1 and FVC are reduced, but the FEV1/FVC ratio is normal.  Following adminitration of bronchodilator there was no significant response.  Consistent with minimal obstructive airway disease.    LHC 07/08/15:  Severe mitral regurgitation with large V waves and 4+ MR  There is mild to moderate left ventricular systolic dysfunction. With cardiac output/index moderate-severely reduced.  Angiographically normal coronary arteries   Past Medical History:  Diagnosis Date  . AICD (automatic cardioverter/defibrillator) present   . Anemia   . Anxiety   . Aortic aneurysm (HCC)   . Arthritis   . Cardiac arrest (HCC)   . Chronic combined systolic and diastolic CHF (congestive heart failure) (HCC) 07/03/2015   Grade 3 diastolic dysfunction.  LVEF 40-45% 05/2015.  Marland Kitchen Depression    SOME DEPRESSION (ON MEDS FOR 2 YRS) WHEN HER MOTHER PASSED  . Patent ductus arteriosus 07/16/2015  . 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 07/22/2015   26 mm Sorin Memo 3D ring annuloplasty with ligation of patent ductus arteriosus and clipping of LA appendage  . Severe mitral regurgitation 07/03/2015  . Shortness of breath dyspnea     Past Surgical History:  Procedure Laterality Date  . CARDIAC CATHETERIZATION N/A 07/08/2015   Procedure: Right/Left Heart Cath and Coronary Angiography;  Surgeon: Marykay Lex, MD;  Location: [facility] INVASIVE CV LAB;  Service: Cardiovascular;  Laterality: N/A;  . CHEST TUBE INSERTION Left 10/21/2015   Procedure: INSERTION left PLEURAL DRAINAGE CATHETER;  Surgeon: Purcell Nails, MD;  Location: [facility] OR;  Service: Thoracic;  Laterality: Left;  . CLIPPING OF ATRIAL APPENDAGE  07/22/2015   Procedure: CLIPPING OF LEFT ATRIAL APPENDAGE;  Surgeon: Purcell Nails, MD;  Location: [facility] OR;  Service: Open Heart Surgery;;  35 [address] Pro  . COLONOSCOPY    . EP IMPLANTABLE DEVICE N/A 08/15/2015   Procedure: ICD Implant;  Surgeon: Will Jorja Loa, MD;   Location: [facility] INVASIVE CV LAB;  Service: Cardiovascular;  Laterality: N/A;  . MICROLARYNGOSCOPY WITH CO2 LASER AND EXCISION OF VOCAL CORD LESION N/A 11/04/2015   Procedure: MICROLARYNGOSCOPY, RADIESSE VOCAL CORD AUGMENTATION;  Surgeon: Flo Shanks, MD;  Location[facility] OR;  Service: ENT;  Laterality: N/A;  . MITRAL VALVE REPAIR N/A 07/22/2015   Procedure: MITRAL VALVE REPAIR;  Surgeon: Purcell Nails, MD;  Location: [facility] OR;  Service: Open Heart Surgery;  Laterality: N/A;  26 Sorin Memo 3D  . OVARIAN CYST REMOVAL    . PATENT DUCTUS ARTERIOUS REPAIR N/A 07/22/2015   Procedure: PATENT DUCTUS ARTERIOSUS (PDA) ligation;  Surgeon: Purcell Nails, MD;  Location: [facility] OR;  Service: Open Heart Surgery;  Laterality: N/A;  . TEE WITHOUT CARDIOVERSION N/A 07/07/2015   Procedure: TRANSESOPHAGEAL ECHOCARDIOGRAM (TEE);  Surgeon: Chilton Si, MD;  Location: [facility] ENDOSCOPY;  Service: Cardiovascular;  Laterality: N/A;  . TEE WITHOUT CARDIOVERSION N/A 07/22/2015   Procedure: TRANSESOPHAGEAL ECHOCARDIOGRAM (TEE);  Surgeon: Purcell Nails, MD;  Location: [facility] OR;  Service: Open Heart Surgery;  Laterality: N/A;  Current Outpatient Medications  Medication Sig Dispense Refill  . aspirin 81 MG tablet Take 81 mg by mouth daily.    . Droxidopa (NORTHERA) 200 MG CAPS Take 1 capsule by mouth 3 (three) times daily.    . ivabradine (CORLANOR) 5 MG TABS tablet Take 1 tablet (5 mg total) by mouth 2 (two) times daily with a meal. 180 tablet 3  . loperamide (IMODIUM) 2 MG capsule Take by mouth. 1 HOUR PRIOR TO TAKING FUROSEMIDE    . midodrine (PROAMATINE) 10 MG tablet Take 1 tablet (10 mg total) by mouth 3 (three) times daily with meals. 270 tablet 3  . potassium chloride 20 MEQ/15ML (10%) SOLN Take 15 mLs (20 mEq total) by mouth daily as needed. 240 mL 3  . torsemide (DEMADEX) 20 MG tablet Take 1 tablet (20 mg total) by mouth daily. 30 tablet 3   No current facility-administered medications for this encounter.     Allergies:    Vicodin [hydrocodone-acetaminophen]; Other; Adhesive [tape]; Cymbalta [duloxetine hcl]; Oxycodone; and Silicone    Social History:  The patient  reports that  has never smoked. she has never used smokeless tobacco. She reports that she does not drink alcohol or use drugs.   Family History:  The patient's family history includes Diabetes in her father; Heart disease in her mother; Hypertension in her mother; Stomach cancer in her father.   Review of systems complete and found to be negative unless listed in HPI.    PHYSICAL EXAM:  Vitals:   08/15/17 1426  BP: 119/64  Pulse: 82  SpO2: 100%  Weight: 95 lb 4 oz (43.2 kg)    Wt Readings from Last 3 Encounters:  08/15/17 95 lb 4 oz (43.2 kg)  06/15/17 92 lb 12.8 oz (42.1 kg)  02/04/17 99 lb 12.8 oz (45.3 kg)   General: Fatigued. Thin. Chronically ill.  HEENT: Normal Neck: Supple. JVP 6-7 cm. Carotids 2+ bilat; no bruits. No thyromegaly or nodule noted. Cor: PMI nondisplaced. RRR, 2/6 TR. No rub or gallop.  Lungs: Clear, though diminished L base. Pleurex site OK.  Abdomen: Soft, non-tender, non-distended, no HSM. No bruits or masses. +BS  Extremities: No cyanosis, clubbing, or rash. R and LLE no edema.  Neuro: Alert & orientedx3, cranial nerves grossly intact. moves all 4 extremities w/o difficulty. 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 S/p ICD for SCD. Also with diffuse delaying enhancement concerning for infiltrative disease.  - PYP scan markedly positive for TTR-amyloid.  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 - Worsening NYHA IIIB-IV symptoms with progressive PND score IIIB in setting of TTR amyloid with extensive cardiac involvement. - She has been seen at the Amyloid Clinic at [facility] (appreciate their input greatly) and the decision to pursue patisiran therapy in [place] has been made. We have received insurance approval for drug but now apparently need approval for infusion. I have discussed with PharmD personally and we will f/u on this. Once arranged we will need to begin infusion therapy ASAP. She  understands that therapy is designed to stabilize her disease and may not lead to regression. May be getting close to Hospice situation unfortunately  - Continue torsemide 20 mg as needed - BP has historically been too soft for BB or ACE/ARB/ARNI.  - Continue ivabradine 5 mg BID. Unable to tolerate higher dose.  - Continue midodrine and Northera for BP support.   2) Recurrent left pleural effusion - Pleurex catheter continues to drain ~200 cc every other day. Wil continue - Followed by Dr. Cornelius Moras and [facility].   3) Mitral regurgitation s/p mitral valve repair:   - Stable s/p MV repair.   4) Paroxysmal atrial fibrillation:  - Remains in NSR. Denies bleeding on coumadin. LA appendage was clipped in surgery. - The patients CHA2DS2-VASc Score and unadjusted Ischemic Stroke Rate (% per year) is equal to 2.2 % stroke rate/year from a score of 2 .   5) Orthostatic hypotension:  - She has been evaluated by Dr. Read Drivers at [facility] with markedly + tilt table. - Continue current regimen. She could not tolerate mestinon.  - Suspect autonomic dysfunction due mostly to TTR neuropathy.   6) Physical deconditioning, severe  - In large part due to her TTR neuropathy and amyloid. She is nearly wheelchair bound. Has stopped HHPT after 2-3 weeks. Would try again after she gets started on patisiran.  Total time spent 45 minutes. Over half that time  spent discussing above.   Arvilla Meres, MD  5:13 PM

## 2017-08-15 NOTE — Patient Instructions (Signed)
Labs today (will call for abnormal results, otherwise no news is good news)  Follow up in 2 Months 

## 2017-08-18 ENCOUNTER — Ambulatory Visit (INDEPENDENT_AMBULATORY_CARE_PROVIDER_SITE_OTHER): Payer: BLUE CROSS/BLUE SHIELD | Admitting: *Deleted

## 2017-08-18 DIAGNOSIS — I429 Cardiomyopathy, unspecified: Secondary | ICD-10-CM

## 2017-08-18 NOTE — Progress Notes (Signed)
Remote ICD transmission.   

## 2017-08-19 ENCOUNTER — Encounter: Payer: Self-pay | Admitting: Cardiology

## 2017-08-22 ENCOUNTER — Other Ambulatory Visit (HOSPITAL_COMMUNITY): Payer: Self-pay | Admitting: Internal Medicine

## 2017-08-30 ENCOUNTER — Telehealth (HOSPITAL_COMMUNITY): Payer: Self-pay | Admitting: *Deleted

## 2017-08-30 DIAGNOSIS — E851 Neuropathic heredofamilial amyloidosis: Secondary | ICD-10-CM

## 2017-08-30 NOTE — Telephone Encounter (Signed)
Pt's insurance are requiring an eval by neurology before they will approved the Onpatro medication for her amyloidosis, referral placed to Dr Allena Katz

## 2017-09-02 LAB — CUP PACEART REMOTE DEVICE CHECK
Brady Statistic AP VP Percent: 0 %
Brady Statistic AP VS Percent: 0.45 %
Brady Statistic AS VP Percent: 0.03 %
Brady Statistic AS VS Percent: 99.52 %
Brady Statistic RV Percent Paced: 0.04 %
Date Time Interrogation Session: 20190131072824
HIGH POWER IMPEDANCE MEASURED VALUE: 40 Ohm
Implantable Lead Location: 753859
Implantable Lead Model: 5076
Lead Channel Impedance Value: 247 Ohm
Lead Channel Impedance Value: 418 Ohm
Lead Channel Pacing Threshold Amplitude: 0.75 V
Lead Channel Pacing Threshold Pulse Width: 0.4 ms
Lead Channel Sensing Intrinsic Amplitude: 1.25 mV
Lead Channel Sensing Intrinsic Amplitude: 1.25 mV
Lead Channel Sensing Intrinsic Amplitude: 6 mV
Lead Channel Sensing Intrinsic Amplitude: 6 mV
Lead Channel Setting Pacing Amplitude: 1.5 V
Lead Channel Setting Pacing Amplitude: 2 V
Lead Channel Setting Pacing Pulse Width: 0.4 ms
Lead Channel Setting Sensing Sensitivity: 0.3 mV
MDC IDC LEAD IMPLANT DT: 20170127
MDC IDC LEAD IMPLANT DT: 20170127
MDC IDC LEAD LOCATION: 753860
MDC IDC MSMT BATTERY REMAINING LONGEVITY: 117 mo
MDC IDC MSMT BATTERY VOLTAGE: 3.01 V
MDC IDC MSMT LEADCHNL RA PACING THRESHOLD AMPLITUDE: 0.625 V
MDC IDC MSMT LEADCHNL RA PACING THRESHOLD PULSEWIDTH: 0.4 ms
MDC IDC MSMT LEADCHNL RV IMPEDANCE VALUE: 361 Ohm
MDC IDC PG IMPLANT DT: 20170127
MDC IDC STAT BRADY RA PERCENT PACED: 0.45 %

## 2017-09-06 ENCOUNTER — Telehealth: Payer: Self-pay | Admitting: Licensed Clinical Social Worker

## 2017-09-06 NOTE — Telephone Encounter (Signed)
CSW received call form patient requesting assistance with completion of Medicare application process. Patient requested to meet with CSW in the clinic and will come tomorrow at 10:30am. Lasandra Beech, LCSW, CCSW-MCS [phone]

## 2017-09-07 ENCOUNTER — Encounter: Payer: Self-pay | Admitting: Neurology

## 2017-09-07 ENCOUNTER — Encounter: Payer: Self-pay | Admitting: Licensed Clinical Social Worker

## 2017-09-07 NOTE — Progress Notes (Signed)
Patient requested to meet with CSW to discuss recent information received from Medicare. CSW reviewed and patient will begin with Medicare benefits starting 11/16/17. Patient received application for extra help as well and asked for assistance in completing. Patient also provided information on [employer] to assist with selecting a Medicare D  Prescription program suitable for her needs. Patient verbalizes understanding and will follow up with [employer]IP to inquire about Medicare Savings Plan as well as a Medicare D. CSW available as needed. Lasandra Beech, LCSW, CCSW-MCS [phone]

## 2017-09-27 ENCOUNTER — Other Ambulatory Visit: Payer: BLUE CROSS/BLUE SHIELD

## 2017-09-27 ENCOUNTER — Ambulatory Visit (INDEPENDENT_AMBULATORY_CARE_PROVIDER_SITE_OTHER): Payer: BLUE CROSS/BLUE SHIELD | Admitting: Neurology

## 2017-09-27 ENCOUNTER — Encounter: Payer: Self-pay | Admitting: Neurology

## 2017-09-27 ENCOUNTER — Other Ambulatory Visit: Payer: Self-pay

## 2017-09-27 VITALS — BP 128/76 | HR 73 | Ht 61.0 in | Wt 99.0 lb

## 2017-09-27 DIAGNOSIS — R531 Weakness: Secondary | ICD-10-CM | POA: Diagnosis not present

## 2017-09-27 DIAGNOSIS — E851 Neuropathic heredofamilial amyloidosis: Secondary | ICD-10-CM

## 2017-09-27 DIAGNOSIS — G63 Polyneuropathy in diseases classified elsewhere: Secondary | ICD-10-CM

## 2017-09-27 NOTE — Procedures (Signed)
[facility] Neurology  [address], [address]  [place], [place] [address] Tel: [phone] Fax:  [phone] Test Date:  09/27/2017  Patient: Diane [name] DOB: 02-05-1961 Physician: Nita Sickle, DO  Sex: Female Height: 5\' 1"  Ref Phys: Nita Sickle, DO  ID#: [id number] Temp: 33.2C Technician:    Patient Complaints: This is a 57 year-old female with a hereditary amyloidosis referred for evaluation of neuropathy.  NCV & EMG Findings: Extensive electrodiagnostic testing of the right upper and lower extremity shows:  1. Right median sensory response shows prolonged distal peak latency (4.0 ms) and reduced amplitude (5.8 V).  Right ulnar sensory response shows reduced amplitude (7.5 V).  2. Right sural and superficial peroneal sensory responses are absent. 3. Right median motor response shows prolonged distal onset latency (4.1 ms) and reduced amplitude (2.0 mV).  Right ulnar and tibial motor responses show reduced amplitude. Right peroneal motor response is absent at the extensor digitorum brevis, and markedly reduced at the tibialis anterior.  4. Chronic motor axon loss changes are seen affecting the muscles in the right upper and lower extremities, conforming to gradient pattern which is worse distally. There is no evidence of active denervation.  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 Overall, these findings are severe in degree electrically.   ___________________________ Nita Sickle, DO    Nerve Conduction Studies Anti Sensory Summary Table   Site NR Peak (ms) Norm Peak (ms) P-T Amp (V) Norm P-T Amp  Right Median Anti Sensory (2nd Digit)  Wrist    4.0 <3.6 5.8 >15  Right Sup Peroneal Anti Sensory (Ant Lat Mall)  33C  12 cm NR  <4.6  >4  Right Sural Anti Sensory (Lat Mall)  33C  Calf NR  <4.6  >4  Right Ulnar Anti Sensory (5th Digit)  33C  Wrist     2.9 <3.1 7.5 >10   Motor Summary Table   Site NR Onset (ms) Norm Onset (ms) O-P Amp (mV) Norm O-P Amp Site1 Site2 Delta-0 (ms) Dist (cm) Vel (m/s) Norm Vel (m/s)  Right Median Motor (Abd Poll Brev)  33C  Wrist    4.1 <4.0 2.0 >6 Elbow Wrist 5.2 26.0 50 >50  Elbow    9.3  1.7         Right Peroneal Motor (Ext Dig Brev)  33C  Ankle NR  <6.0  >2.5 B Fib Ankle  28.0  >40  B Fib NR     Poplt B Fib  5.0  >40  Poplt NR            Right Peroneal TA Motor (Tib Ant)  33C  Fib Head    3.8 <4.5 1.9 >3 Poplit Fib Head 1.5 7.0 47 >40  Poplit    5.3  1.8         Right Tibial Motor (Abd Hall Brev)  33C  Ankle    4.1 <6.0 1.0 >4 Knee Ankle 11.3 35.0 31 >40  Knee    15.4  0.7         Right Ulnar Motor (Abd Dig Minimi)  33C  Wrist    2.4 <3.1 4.9 >7 B Elbow Wrist 4.0 20.0 50 >50  B Elbow    6.4  4.5  A Elbow B Elbow 2.4 0.0  >50  A Elbow    8.8  4.0          EMG   Side Muscle  Ins Act Fibs Psw Fasc Number Recrt Dur Dur. Amp Amp. Poly Poly. Comment  Right Abd Poll Brev Nml Nml Nml Nml 2- Rapid Many 1+ Many 1+ Some 1+ N/A  Right Ext Indicis Nml Nml Nml Nml 2- Rapid Some 1+ Some 1+ Some 1+ N/A  Right AntTibialis Nml Nml Nml Nml 2- Rapid Many 1+ Many 1+ Many 1+ N/A  Right RectFemoris Nml Nml Nml Nml 2- Rapid Some 1+ Some 1+ Nml Nml N/A  Right BicepsFemS Nml Nml Nml Nml 2- Rapid Some 1+ Some 1+ Nml Nml N/A  Right Flex Dig Long Nml Nml Nml Nml 3- Rapid Many 1+ Many 1+ Many 1+ N/A  Right Gastroc Nml Nml Nml Nml 3- Rapid Most 1+ Most 1+ Nml Nml N/A  Right 1stDorInt Nml Nml Nml Nml 3- Rapid All 1+ Most 1+ Many 1+ N/A  Right GluteusMed Nml Nml Nml Nml Nml Nml Nml Nml Nml Nml Nml Nml N/A  Right PronatorTeres Nml Nml Nml Nml Nml Nml Nml Nml Nml Nml Nml Nml N/A  Right Triceps Nml Nml Nml Nml Nml Nml Nml Nml Nml Nml Nml Nml N/A      Waveforms:

## 2017-09-27 NOTE — Progress Notes (Signed)
[facility] Neurology Division Clinic Note - Initial Visit   Date: 09/27/17  Diane [name] MRN: [id number] DOB: October 02, 1960   Dear Dr. Gala Romney:  Thank you for your kind referral of Diane [name] for consultation of hereditary amyloidosis neuropathy. Although her history is well known to you, please allow [place] to reiterate it for the purpose of our medical record. The patient was accompanied to the clinic by self.   History of Present Illness: Diane [name] is a 57 y.o. 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  Starting late 2016, patient was having exertional dyspnea and syncope and found to have severe mitral regurgitation which was repaired in January 2017.  A month later, she suffered cardiac arrest at home and had extended hospitalization for cardiac arrythmias, pleural effusions, as well as severe orthostatic hypotension. In September 2018, patient had cardiac MRI and findings were concerning for cardiac amyloidosis. 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 She had tilt table testing in November 2017 which confirmed autonomic dysfunction.   Her family history is notable for cardiomyopathy in her mother and maternal aunt.   Prior to her cardiac manifestations of amyloidosis, she recalls having burning and tingling sensation over the fingertip toes dating back to ~2015.  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 Over the following few years, her burning and tingling sensation progressed and involved her feet and lower legs, as well as the hands and forearm.  Currently, she has constant numbness of the feet up to the level  of the knees, as if they are "wood pegs" and burning/tingling sensation of the hands and forearms.  Her hands and feet always feel cold. For her pain, she has previously tried Lyrica (intolerant) and Cymbalta (behavior changes).  She uses lidocaine ointment which has little benefit.  She has generalized weakness, imbalance, and several falls.  She has been using cane and walker since 2017.   She is having greater difficulty with day-to-day activities, such as driving, bathing, and dressing due to fatigue as well as neuropathy. She lives alone and has two grown children.  She has self-restricted her driving because of inability to manipulate the pedals.  She bathes herself about once per week and uses a shower chair.    She had previously seen my colleague, Dr. Karel Jarvis in 2016, for right facial spasm.  MRI brain and EEG was normal.   Past Medical History:  Diagnosis Date  . AICD (automatic cardioverter/defibrillator) present   . Anemia   . Anxiety   . Aortic aneurysm (HCC)   . Arthritis   . Cardiac arrest (HCC)   . Chronic combined systolic and diastolic CHF (congestive heart failure) (HCC) 07/03/2015   Grade 3 diastolic dysfunction.  LVEF 40-45% 05/2015.  Marland Kitchen Depression    SOME DEPRESSION (ON MEDS FOR 2 YRS) WHEN HER MOTHER PASSED  . Patent ductus arteriosus 07/16/2015  . 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 07/22/2015   26 mm Sorin Memo 3D ring annuloplasty with ligation of patent ductus arteriosus and clipping of LA appendage  . Severe mitral regurgitation 07/03/2015  . Shortness of breath dyspnea     Past Surgical History:  Procedure Laterality Date  . CARDIAC CATHETERIZATION  N/A 07/08/2015   Procedure: Right/Left Heart Cath and Coronary Angiography;  Surgeon: Marykay Lex, MD;  Location: [facility] INVASIVE CV LAB;  Service: Cardiovascular;  Laterality: N/A;  . CHEST TUBE INSERTION Left 10/21/2015   Procedure: INSERTION left PLEURAL DRAINAGE CATHETER;  Surgeon:  Purcell Nails, MD;  Location: [facility] OR;  Service: Thoracic;  Laterality: Left;  . CLIPPING OF ATRIAL APPENDAGE  07/22/2015   Procedure: CLIPPING OF LEFT ATRIAL APPENDAGE;  Surgeon: Purcell Nails, MD;  Location: [facility] OR;  Service: Open Heart Surgery;;  35 [address] Pro  . COLONOSCOPY    . EP IMPLANTABLE DEVICE N/A 08/15/2015   Procedure: ICD Implant;  Surgeon: Will Jorja Loa, MD;  Location: [facility] INVASIVE CV LAB;  Service: Cardiovascular;  Laterality: N/A;  . MICROLARYNGOSCOPY WITH CO2 LASER AND EXCISION OF VOCAL CORD LESION N/A 11/04/2015   Procedure: MICROLARYNGOSCOPY, RADIESSE VOCAL CORD AUGMENTATION;  Surgeon: Flo Shanks, MD;  Location: [facility] OR;  Service: ENT;  Laterality: N/A;  . MITRAL VALVE REPAIR N/A 07/22/2015   Procedure: MITRAL VALVE REPAIR;  Surgeon: Purcell Nails, MD;  Location: [facility] OR;  Service: Open Heart Surgery;  Laterality: N/A;  26 Sorin Memo 3D  . OVARIAN CYST REMOVAL    . PATENT DUCTUS ARTERIOUS REPAIR N/A 07/22/2015   Procedure: PATENT DUCTUS ARTERIOSUS (PDA) ligation;  Surgeon: Purcell Nails, MD;  Location: [facility] OR;  Service: Open Heart Surgery;  Laterality: N/A;  . TEE WITHOUT CARDIOVERSION N/A 07/07/2015   Procedure: TRANSESOPHAGEAL ECHOCARDIOGRAM (TEE);  Surgeon: Chilton Si, MD;  Location: [facility] ENDOSCOPY;  Service: Cardiovascular;  Laterality: N/A;  . TEE WITHOUT CARDIOVERSION N/A 07/22/2015   Procedure: TRANSESOPHAGEAL ECHOCARDIOGRAM (TEE);  Surgeon: Purcell Nails, MD;  Location: [facility] OR;  Service: Open Heart Surgery;  Laterality: N/A;     Medications:  Outpatient Encounter Medications as of 09/27/2017  Medication Sig  . aspirin 81 MG tablet Take 81 mg by mouth daily.  . Droxidopa (NORTHERA) 200 MG CAPS Take 1 capsule by mouth 3 (three) times daily.  . ivabradine (CORLANOR) 5 MG TABS tablet Take 1 tablet (5 mg total) by mouth 2 (two) times daily with a meal.  . loperamide (IMODIUM) 2 MG capsule Take by mouth. 1 HOUR PRIOR TO TAKING FUROSEMIDE  . midodrine (PROAMATINE)  10 MG tablet Take 1 tablet (10 mg total) by mouth 3 (three) times daily with meals.  . midodrine (PROAMATINE) 5 MG tablet TAKE 2 TABLETS BY MOUTH 3 TIMES A DAY  . potassium chloride 20 MEQ/15ML (10%) SOLN Take 15 mLs (20 mEq total) by mouth daily as needed.  . torsemide (DEMADEX) 20 MG tablet Take 1 tablet (20 mg total) by mouth daily.   No facility-administered encounter medications on file as of 09/27/2017.      Allergies:  Allergies  Allergen Reactions  . Vicodin [Hydrocodone-Acetaminophen] Other (See Comments)    "she feels out of it", hallucinating  . Other Hives and Itching    Alcohol pads  . Adhesive [Tape] Itching and Rash  . Cymbalta [Duloxetine Hcl] Nausea And Vomiting and Other (See Comments)    Excessive dizziness  . Oxycodone Nausea Only    Makes her feel strange   . Silicone Itching and Rash    TAPE ALLERGY/EKG STICKER ALLERGY, Please use "paper" tape only    Family History: Family History  Problem Relation Age of Onset  . Hypertension Mother   . Heart disease Mother   . Diabetes Father   . Stomach cancer Father  Social History: Social History   Tobacco Use  . Smoking status: Never Smoker  . Smokeless tobacco: Never Used  Substance Use Topics  . Alcohol use: No    Alcohol/week: 0.0 oz  . Drug use: No   Social History   Social History Narrative   She lives alone.  She had two grown children.     Review of Systems:  CONSTITUTIONAL: No fevers, chills, night sweats, +weight loss.   EYES: No visual changes or eye pain ENT: No hearing changes.  No history of nose bleeds.   RESPIRATORY: No cough, wheezing +shortness of breath.   CARDIOVASCULAR: Negative for chest pain, and palpitations.   GI: Negative for abdominal discomfort, blood in stools or black stools.  No recent change in bowel habits.   GU:  No history of incontinence.   MUSCLOSKELETAL: No history of joint pain +swelling.  No myalgias.   SKIN: Negative for lesions, rash, and itching.     HEMATOLOGY/ONCOLOGY: Negative for prolonged bleeding, bruising easily, and swollen nodes.  No history of cancer.   ENDOCRINE: Negative for cold or heat intolerance, polydipsia or goiter.   PSYCH:  No depression or anxiety symptoms.   NEURO: As Above.   Vital Signs:  BP 128/76   Pulse 73   Ht 5\' 1"  (1.549 m)   Wt 99 lb (44.9 kg)   SpO2 98%   BMI 18.71 kg/m    General Medical Exam:   General:  Well appearing, comfortable.   Eyes/ENT: see cranial nerve examination.   Neck: No masses appreciated.  Full range of motion without tenderness.  No carotid bruits. Respiratory:  Clear to auscultation, reduced breath sounds. Cardiac:  Regular rate and rhythm   Extremities:  No deformities, 1+ pitting ankle edema  Skin:  No rashes or lesions.  Neurological Exam: MENTAL STATUS including orientation to time, place, person, recent and remote memory, attention span and concentration, language, and fund of knowledge is normal.  Speech is not dysarthric, slightly hoarse and soft.  CRANIAL NERVES: II:  No visual field defects.  Unremarkable fundi.   III-IV-VI: Pupils equal round and reactive to light.  Normal conjugate, extra-ocular eye movements in all directions of gaze.  No nystagmus.  No ptosis.   V:  Normal facial sensation   VII:  Normal facial symmetry and movements.  VIII:  Normal hearing and vestibular function.   IX-X:  Normal palatal movement.   XI:  Normal shoulder shrug and head rotation.   XII:  Normal tongue strength and range of motion, no deviation or fasciculation.  MOTOR:  Generalized loss of muscle bulk throughout.  No fasciculations or abnormal movements.  No pronator drift.  Tone is normal.    Right Upper Extremity:    Left Upper Extremity:    Deltoid  4/5   Deltoid  4/5   Biceps  4/5   Biceps  4/5   Triceps  4/5   Triceps  4/5   Wrist extensors  4/5   Wrist extensors  4/5   Wrist flexors  4/5   Wrist flexors  4/5   Finger extensors  4-/5   Finger extensors  4-/5    Finger flexors  4-/5   Finger flexors  4-/5   Dorsal interossei  4-/5   Dorsal interossei  4-/5   Abductor pollicis  4-/5   Abductor pollicis  4-/5   Tone (Ashworth scale)  0  Tone (Ashworth scale)  0   Right Lower Extremity:    Left  Lower Extremity:    Hip flexors  4/5   Hip flexors  4/5   Hip extensors  4/5   Hip extensors  4/5   Knee flexors  4/5   Knee flexors  4/5   Knee extensors  4/5   Knee extensors  4/5   Dorsiflexors  4/5   Dorsiflexors  4/5   Plantarflexors  4/5   Plantarflexors  4/5   Toe extensors  4-/5   Toe extensors  4-/5   Toe flexors  4-/5   Toe flexors  4-/5   Tone (Ashworth scale)  0  Tone (Ashworth scale)  0   MSRs:  Right                                                                 Left brachioradialis 2+  brachioradialis 2+  biceps 2+  biceps 2+  triceps 2+  triceps 2+  patellar 0  Patellar 0  ankle jerk 0  ankle jerk 0  Hoffman no  Hoffman no  plantar response down  plantar response down   SENSORY:  Vibration is reduced at the knees and absent distal to ankles bilaterally.  Severe sensory loss to pin prick, light touch, and temperature distal to mid-forearms and knees bilaterally.  Rhomberg testing is positive.    COORDINATION/GAIT: Normal finger-to- nose-finger.  Finger tapping and heel tapping is slowed due to weakness. Gait is slow, unsteady and assisted with cane, wide-based.   IMPRESSION: Diane [name] is a 57 year-old female with hereditary ATTR amyloidosis referred for evaluation of progressive paresthesias and weakness of the arms and legs.  Her exam shows severe stocking-glove distribution of sensory loss to all modalities and weakness as well as distal arreflexia, consistent with large fiber polyneuropathy.  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 Her tilt table evaluation at [facility] also confirms autonomic neuropathy.  Secondary causes of  neuropathy have been excluded.   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 Her polyneuropathy disability (PND) score is stage 3b.  She is in the approval process to start patisiran.  For her neuropathic pain, she has previously tried Lyrica (intolerance), Cymbalta (behavior changes), TCA contraindicated.  I offered gabapentin, but she is concerned about side effects.  She may continue to use lidocaine ointment as needed.    I am happy to see her back as needed.   Greater than 50% of this 50 minute visit was spent in counseling, explanation of diagnosis, planning of further management, and coordination of care.   Thank you for allowing me to participate in patient's care.  If I can answer any additional questions, I would be pleased to do so.    Sincerely,    Rodman Recupero K. Allena Katz, DO

## 2017-10-17 ENCOUNTER — Encounter (HOSPITAL_COMMUNITY): Payer: BLUE CROSS/BLUE SHIELD | Admitting: Internal Medicine

## 2017-11-07 ENCOUNTER — Other Ambulatory Visit (HOSPITAL_COMMUNITY): Payer: Self-pay | Admitting: *Deleted

## 2017-11-16 ENCOUNTER — Encounter (HOSPITAL_COMMUNITY): Payer: Self-pay | Admitting: Internal Medicine

## 2017-11-16 ENCOUNTER — Ambulatory Visit (HOSPITAL_COMMUNITY)
Admission: RE | Admit: 2017-11-16 | Discharge: 2017-11-16 | Disposition: A | Discharge status: Home / Self Care | Payer: Medicare HMO | Source: Ambulatory Visit | Attending: Internal Medicine | Admitting: Internal Medicine

## 2017-11-16 VITALS — BP 94/40 | HR 70 | Wt 94.8 lb

## 2017-11-16 DIAGNOSIS — E851 Neuropathic heredofamilial amyloidosis: Secondary | ICD-10-CM

## 2017-11-16 DIAGNOSIS — J90 Pleural effusion, not elsewhere classified: Secondary | ICD-10-CM | POA: Insufficient documentation

## 2017-11-16 DIAGNOSIS — F419 Anxiety disorder, unspecified: Secondary | ICD-10-CM | POA: Diagnosis not present

## 2017-11-16 DIAGNOSIS — I43 Cardiomyopathy in diseases classified elsewhere: Secondary | ICD-10-CM | POA: Diagnosis not present

## 2017-11-16 DIAGNOSIS — Z833 Family history of diabetes mellitus: Secondary | ICD-10-CM | POA: Diagnosis not present

## 2017-11-16 DIAGNOSIS — Z79899 Other long term (current) drug therapy: Secondary | ICD-10-CM | POA: Diagnosis not present

## 2017-11-16 DIAGNOSIS — Z993 Dependence on wheelchair: Secondary | ICD-10-CM | POA: Insufficient documentation

## 2017-11-16 DIAGNOSIS — R634 Abnormal weight loss: Secondary | ICD-10-CM | POA: Diagnosis not present

## 2017-11-16 DIAGNOSIS — I951 Orthostatic hypotension: Secondary | ICD-10-CM | POA: Diagnosis not present

## 2017-11-16 DIAGNOSIS — E854 Organ-limited amyloidosis: Secondary | ICD-10-CM

## 2017-11-16 DIAGNOSIS — R69 Illness, unspecified: Secondary | ICD-10-CM | POA: Diagnosis not present

## 2017-11-16 DIAGNOSIS — Z95810 Presence of automatic (implantable) cardiac defibrillator: Secondary | ICD-10-CM | POA: Diagnosis not present

## 2017-11-16 DIAGNOSIS — I340 Nonrheumatic mitral (valve) insufficiency: Secondary | ICD-10-CM | POA: Diagnosis not present

## 2017-11-16 DIAGNOSIS — I480 Paroxysmal atrial fibrillation: Secondary | ICD-10-CM | POA: Diagnosis not present

## 2017-11-16 DIAGNOSIS — Z8674 Personal history of sudden cardiac arrest: Secondary | ICD-10-CM | POA: Diagnosis not present

## 2017-11-16 DIAGNOSIS — F329 Major depressive disorder, single episode, unspecified: Secondary | ICD-10-CM | POA: Diagnosis not present

## 2017-11-16 DIAGNOSIS — Z8249 Family history of ischemic heart disease and other diseases of the circulatory system: Secondary | ICD-10-CM | POA: Diagnosis not present

## 2017-11-16 DIAGNOSIS — I5042 Chronic combined systolic (congestive) and diastolic (congestive) heart failure: Secondary | ICD-10-CM | POA: Diagnosis not present

## 2017-11-16 DIAGNOSIS — G629 Polyneuropathy, unspecified: Secondary | ICD-10-CM | POA: Insufficient documentation

## 2017-11-16 DIAGNOSIS — Z885 Allergy status to narcotic agent status: Secondary | ICD-10-CM | POA: Insufficient documentation

## 2017-11-16 DIAGNOSIS — Z888 Allergy status to other drugs, medicaments and biological substances status: Secondary | ICD-10-CM | POA: Insufficient documentation

## 2017-11-16 DIAGNOSIS — Z952 Presence of prosthetic heart valve: Secondary | ICD-10-CM | POA: Diagnosis not present

## 2017-11-16 DIAGNOSIS — E859 Amyloidosis, unspecified: Secondary | ICD-10-CM | POA: Diagnosis not present

## 2017-11-16 DIAGNOSIS — Z7982 Long term (current) use of aspirin: Secondary | ICD-10-CM | POA: Diagnosis not present

## 2017-11-16 MED ORDER — POTASSIUM CHLORIDE ER 10 MEQ PO TBCR: 10.0000 meq | EXTENDED_RELEASE_TABLET | Freq: Every day | ORAL | 6 refills | Status: DC

## 2017-11-16 MED ORDER — TORSEMIDE 20 MG PO TABS: 20.0000 mg | ORAL_TABLET | Freq: Every day | ORAL | 6 refills | Status: DC

## 2017-11-16 NOTE — Progress Notes (Signed)
[name], female   DOB: Nov 24, 1960, 57 y.o.   MRN: [id number]   ADVANCED HF CLINIC NOTE   Date:  11/16/2017   ID:  Diane [name], DOB [birth year], MRN [id number]  PCP:  Darrow Bussing, MD  Cardiologist:   Dr. Duke Salvia Electrophysiologist: Dr. Berton Mount   [name] is a 57 y.o. female who is referred to the HF Clinic by Dr. Duke Salvia for further evaluation of her chronic systolic and diastolic heart failure and possible infiltrative CM.  Symptoms started in 2016 with syncope and DOE. Referred to Dr. Duke Salvia in 12/16. Echocardiography revealed severe mitral regurgitation and a reduced LVEF (45-50%).  The MR was due to tethering of the posterior leaflet.  She was also noted to have grade 3 diastolic dysfunction with mid basal to mid inferolateral wall hypokinesis.  On 07/22/15 underwent repair of her mitral valve with a 26 mm Sorin Memo 3D annuloplasty ring on 07/22/15 by Dr. Cornelius Moras.  Intra-operatively she was noted to have a PDA with left to right shunting that was ligated in the OR. The left atrial appendage was also clipped.   On 08/05/15 she suffered a cardiac arrest at home and was resuscitated by EMS.  The initial rhythm was unclear but she had VT upon arrival in the ED, which was followed by atrial fibrillation.  She was treated with hypothermia and had prolonged hospital course. Fortunately she has no residual cognitive deficits.  During that hospitalization she underwent cardiac MRI with LVEF 45% with hypokinesis of the distal septum and apex.  There was diffuse, delayed enhancement in the subendocardium and mid epicardial region, most prominent in the inferior wall.  This was concerning for myocarditis or infiltrative cardiomyopathy.  Her EKG showed QT prolongation and she had an ICD placed for secondary prevention.  Her hospitalization was delayed by severe orthostatic hypotension and recurrent syncope requiring high doses of florinef and midodrine which were weaned  down.  She was discharged home but was readmitted with volume overload on 2/16.  Echo showed LVEF 35% with diffuse hypokinesis and her mitral valve was functioning well.    She underwent Pleurex catheter placement for recurrent left pleural effusion on 10/20/16.   We referred her to Dr. Erenest Rasher at [facility] and underwent Tilt Table testing 05/2016 that revealed autonomic failure.  Her BP dropped to 55/32.  She was started on mestanon but has been unable to tolerate it due to diarrhea.  She was also referred for a genetic evaluation given her long QT and SCD.  Repeat cMRI in 9/18 at [facility] showed EF 38% with diffuse myocardial as well as atrial enhancement concerning for infiltrative disease such as amyloid.   Tc-PYP scan 04/28/17 was strongly positive (qualitative = 3, H/CL > 1.5) c/w ATTR-CA. 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 Her family history is also notable for cardiomyopathy in her mother and maternal aunt at age ~74 yo and may suggest they had hereditary ATTR-CM as well.   Pt previously seen by Dr. Carilyn Goodpasture at [facility] and plan was for her to start patisiran here in [place].   She presents today for regular follow up. Not feeling well. She has not been urinating much with torsemide. Urine has been dark. She has had worsening BLE edema for months. She is taking torsemide nearly every day. Decreased appetite, but has been forcing herself to eat. No nausea/vomiting.  SOB is worse. SOB with any movement. Worsening peripheral neuropathy. Very weak. Very difficult to walk. Asking about Hospice.  Draining pleurex 3x/week with usually 200 mls out, but last night only 50 mls. PT/OT did not help. She is dizzy with walking and sitting to standing. Not weighing regularly at home.  PFT 11/05/14:  FEV1  1.9 L FVC : 2.4 L FEV1 and FVC are reduced, but the  FEV1/FVC ratio is normal.  Following adminitration of bronchodilator there was no significant response.  Consistent with minimal obstructive airway disease.    LHC 07/08/15:  Severe mitral regurgitation with large V waves and 4+ MR  There is mild to moderate left ventricular systolic dysfunction. With cardiac output/index moderate-severely reduced.  Angiographically normal coronary arteries   Past Medical History:  Diagnosis Date  . AICD (automatic cardioverter/defibrillator) present   . Anemia   . Anxiety   . Aortic aneurysm (HCC)   . Arthritis   . Cardiac arrest (HCC)   . Chronic combined systolic and diastolic CHF (congestive heart failure) (HCC) 07/03/2015   Grade 3 diastolic dysfunction.  LVEF 40-45% 05/2015.  Marland Kitchen Depression    SOME DEPRESSION (ON MEDS FOR 2 YRS) WHEN HER MOTHER PASSED  . Patent ductus arteriosus 07/16/2015  . 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 07/22/2015   26 mm Sorin Memo 3D ring annuloplasty with ligation of patent ductus arteriosus and clipping of LA appendage  . Severe mitral regurgitation 07/03/2015  . Shortness of breath dyspnea     Past Surgical History:  Procedure Laterality Date  . CARDIAC CATHETERIZATION N/A 07/08/2015   Procedure: Right/Left Heart Cath and Coronary Angiography;  Surgeon: Marykay Lex, MD;  Location: [facility] INVASIVE CV LAB;  Service: Cardiovascular;  Laterality: N/A;  . CHEST TUBE INSERTION Left 10/21/2015   Procedure: INSERTION left PLEURAL DRAINAGE CATHETER;  Surgeon: Purcell Nails, MD;  Location: [facility] OR;  Service: Thoracic;  Laterality: Left;  . CLIPPING OF ATRIAL APPENDAGE  07/22/2015   Procedure: CLIPPING OF LEFT ATRIAL APPENDAGE;  Surgeon: Purcell Nails, MD;  Location: [facility] OR;  Service: Open Heart Surgery;;  35 [address] Pro  . COLONOSCOPY    . EP IMPLANTABLE DEVICE N/A 08/15/2015   Procedure: ICD Implant;  Surgeon: Will Jorja Loa, MD;  Location: [facility] INVASIVE CV LAB;  Service: Cardiovascular;   Laterality: N/A;  . MICROLARYNGOSCOPY WITH CO2 LASER AND EXCISION OF VOCAL CORD LESION N/A 11/04/2015   Procedure: MICROLARYNGOSCOPY, RADIESSE VOCAL CORD AUGMENTATION;  Surgeon: Flo Shanks, MD;  Location: [facility] OR;  Service: ENT;  Laterality: N/A;  . MITRAL VALVE REPAIR N/A 07/22/2015   Procedure: MITRAL VALVE REPAIR;  Surgeon: Purcell Nails, MD;  Location: [facility] OR;  Service: Open Heart Surgery;  Laterality: N/A;  26 Sorin Memo 3D  . OVARIAN CYST REMOVAL    . PATENT DUCTUS ARTERIOUS REPAIR N/A 07/22/2015   Procedure: PATENT DUCTUS ARTERIOSUS (PDA) ligation;  Surgeon: Purcell Nails, MD;  Location: [facility] OR;  Service: Open Heart Surgery;  Laterality: N/A;  . TEE WITHOUT CARDIOVERSION N/A 07/07/2015   Procedure: TRANSESOPHAGEAL ECHOCARDIOGRAM (TEE);  Surgeon: Chilton Si, MD;  Location: [facility] ENDOSCOPY;  Service: Cardiovascular;  Laterality: N/A;  . TEE WITHOUT CARDIOVERSION N/A 07/22/2015   Procedure: TRANSESOPHAGEAL ECHOCARDIOGRAM (TEE);  Surgeon: Purcell Nails, MD;  Location: [facility] OR;  Service: Open Heart Surgery;  Laterality: N/A;     Current Outpatient Medications  Medication Sig Dispense Refill  . aspirin 81 MG tablet Take 81 mg by  mouth daily.    . Droxidopa (NORTHERA) 200 MG CAPS Take 1 capsule by mouth 3 (three) times daily.    . ivabradine (CORLANOR) 5 MG TABS tablet Take 1 tablet (5 mg total) by mouth 2 (two) times daily with a meal. 180 tablet 3  . midodrine (PROAMATINE) 10 MG tablet Take 1 tablet (10 mg total) by mouth 3 (three) times daily with meals. 270 tablet 3  . potassium chloride 20 MEQ/15ML (10%) SOLN Take 15 mLs (20 mEq total) by mouth daily as needed. 240 mL 3  . torsemide (DEMADEX) 20 MG tablet Take 1 tablet (20 mg total) by mouth daily. 30 tablet 3   No current facility-administered medications for this encounter.     Allergies:   Vicodin [hydrocodone-acetaminophen]; Other; Adhesive [tape]; Cymbalta [duloxetine hcl]; Oxycodone; and Silicone    Social History:  The  patient  reports that she has never smoked. She has never used smokeless tobacco. She reports that she does not drink alcohol or use drugs.   Family History:  The patient's family history includes Diabetes in her father; Heart disease in her mother; Hypertension in her mother; Stomach cancer in her father.   Review of systems complete and found to be negative unless listed in HPI.    PHYSICAL EXAM:  Vitals:   11/16/17 0919  BP: (!) 94/40  Pulse: 70  SpO2: 100%  Weight: 94 lb 12.8 oz (43 kg)    Wt Readings from Last 3 Encounters:  11/16/17 94 lb 12.8 oz (43 kg)  09/27/17 99 lb (44.9 kg)  08/15/17 95 lb 4 oz (43.2 kg)   General: fatigued. Chronically ill appearing  Thin. No resp difficulty. HEENT: Normal anicteric  Neck: Supple. JVP 7. Carotids 2+ bilat; no bruits. No thyromegaly or nodule noted. Cor: PMI nondisplaced. RRR, 2/6 TR Lungs: Diminished in bases. Left pleurex No wheeze Abdomen: Soft, non-tender, non-distended, no HSM. No bruits or masses. +BS  Extremities: No cyanosis, clubbing, or rash. R and LLE 1+ ankle edema cachetic  Neuro: Alert & orientedx3, cranial nerves grossly intact. 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 S/p ICD for SCD. Also with diffuse delaying enhancement concerning for infiltrative disease.  - PYP scan markedly positive for TTR-amyloid.  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 - Worsening NYHA IIIB-IV symptoms with progressive  PND score IIIB in setting of TTR amyloid with extensive cardiac involvement. - She has been seen at the Amyloid Clinic at [facility] (appreciate their input greatly) and the decision to pursue patisiran therapy in [place] has been made. We have received insurance approval for drug but now apparently need approval for infusion. I have discussed with PharmD personally and we will f/u on this. Once arranged we will need to begin infusion therapy ASAP. She understands that therapy is designed to stabilize her disease and may not lead to regression. May be getting close to Hospice situation unfortunately  - Continue torsemide 20 mg as needed - BP has historically been too soft  for BB or ACE/ARB/ARNI.  - Continue ivabradine 5 mg BID. Unable to tolerate higher dose.  - Continue midodrine and Northera for BP support.   2) Recurrent left pleural effusion - Pleurex catheter continues to drain ~200 cc every other day. Wil continue - Followed by Dr. Cornelius Moras and [facility].   3) Mitral regurgitation s/p mitral valve repair:   - Stable s/p MV repair.   4) Paroxysmal atrial fibrillation:  - Remains in NSR. Denies bleeding on coumadin. LA appendage was clipped in surgery. - The patients CHA2DS2-VASc Score and unadjusted Ischemic Stroke Rate (% per year) is equal to 2.2 % stroke rate/year from a score of 2 .   5) Orthostatic hypotension:  - She has been evaluated by Dr. Read Drivers at [facility] with markedly + tilt table. - Continue current regimen. She could not tolerate mestinon.  - Suspect autonomic dysfunction due mostly to TTR neuropathy.   6) Physical deconditioning, severe  - In large part due to her TTR neuropathy and amyloid. She is nearly wheelchair bound. Has stopped HHPT after 2-3 weeks. Would try again after she gets started on patisiran.  Refer to palliative Dr Gala Romney will work on TTR treatment with new insurance.   Alford Highland, NP  9:44 AM   Patient seen and examined with the above-signed Advanced  Practice Provider and/or Housestaff. I personally reviewed laboratory data, imaging studies and relevant notes. I independently examined the patient and formulated the important aspects of the plan. I have edited the note to reflect any of my changes or salient points. I have personally discussed the plan with the patient and/or family.  She has end-stage TTR amyloidosis now with progressive weight loss, neuropathy and HF  NYHA IV. We discussed options of palliative care or trying salvage therapy with patisiran. She is very clear that she is nearing her death but would like to continue to try to see if she can start patisiran therapy and obtain any benefit at least with reduced discomfort from neuropathy. She has now switched insurance plans. Will repeat submission for medication approval. We will also reach out to connect her with Palliative Care team but not plan for [facility] care yet at this point.   Total time spent 45 minutes. Over half that time spent discussing above.   Arvilla Meres, MD  11:49 PM  .

## 2017-11-16 NOTE — Patient Instructions (Signed)
We sent a new prescription to [facility] to change your liquid potassium to tablets, please take 2 tabs daily  You have been referred to Palliative Care, they will contact you  Your physician recommends that you schedule a follow-up appointment in: 3 months

## 2017-11-17 ENCOUNTER — Telehealth: Payer: Self-pay

## 2017-11-17 ENCOUNTER — Ambulatory Visit (INDEPENDENT_AMBULATORY_CARE_PROVIDER_SITE_OTHER): Payer: Medicare HMO | Admitting: *Deleted

## 2017-11-17 DIAGNOSIS — I429 Cardiomyopathy, unspecified: Secondary | ICD-10-CM | POA: Diagnosis not present

## 2017-11-17 DIAGNOSIS — J918 Pleural effusion in other conditions classified elsewhere: Secondary | ICD-10-CM | POA: Diagnosis not present

## 2017-11-17 DIAGNOSIS — L89151 Pressure ulcer of sacral region, stage 1: Secondary | ICD-10-CM | POA: Diagnosis not present

## 2017-11-17 DIAGNOSIS — I509 Heart failure, unspecified: Secondary | ICD-10-CM | POA: Diagnosis not present

## 2017-11-17 NOTE — Telephone Encounter (Signed)
Diane [name] called and stated that her insurance changed and she needed another supplier for her PleurX bottles.  I advised her to call the PleurX patient navigators and they would be able to help her get the correct supplier that accepts her insurance.  She acknowledged receipt.

## 2017-11-17 NOTE — Progress Notes (Signed)
Remote ICD transmission.   

## 2017-11-21 ENCOUNTER — Other Ambulatory Visit (HOSPITAL_COMMUNITY): Payer: Self-pay | Admitting: Pharmacist

## 2017-11-21 MED ORDER — IVABRADINE HCL 5 MG PO TABS: 5.0000 mg | ORAL_TABLET | Freq: Two times a day (BID) | ORAL | 3 refills | Status: DC

## 2017-11-22 ENCOUNTER — Encounter: Payer: Self-pay | Admitting: Cardiology

## 2017-11-23 ENCOUNTER — Ambulatory Visit: Payer: Medicare HMO | Admitting: Thoracic Surgery (Cardiothoracic Vascular Surgery)

## 2017-11-23 ENCOUNTER — Other Ambulatory Visit: Payer: Self-pay

## 2017-11-23 ENCOUNTER — Other Ambulatory Visit: Payer: Self-pay | Admitting: *Deleted

## 2017-11-23 ENCOUNTER — Ambulatory Visit
Admission: RE | Admit: 2017-11-23 | Discharge: 2017-11-23 | Disposition: A | Discharge status: Home / Self Care | Payer: Medicare HMO | Source: Ambulatory Visit | Attending: Cardiothoracic Surgery | Admitting: Cardiothoracic Surgery

## 2017-11-23 ENCOUNTER — Encounter: Payer: Self-pay | Admitting: Thoracic Surgery (Cardiothoracic Vascular Surgery)

## 2017-11-23 VITALS — BP 124/89 | HR 84 | Resp 18 | Ht 62.0 in | Wt 93.0 lb

## 2017-11-23 DIAGNOSIS — Z9689 Presence of other specified functional implants: Secondary | ICD-10-CM | POA: Diagnosis not present

## 2017-11-23 DIAGNOSIS — J90 Pleural effusion, not elsewhere classified: Secondary | ICD-10-CM

## 2017-11-23 DIAGNOSIS — I5042 Chronic combined systolic (congestive) and diastolic (congestive) heart failure: Secondary | ICD-10-CM

## 2017-11-23 NOTE — Patient Instructions (Signed)
Continue all previous medications without any changes at this time  

## 2017-11-23 NOTE — Progress Notes (Signed)
[address]       [place] [address]             [phone]     CARDIOTHORACIC SURGERY OFFICE NOTE  Primary Cardiologist is Chilton Si, MD CHF Cardiologist is Bensimhon, Bevelyn Buckles, MD Electrophysiologist is Duke Salvia, MD Pulmonologist is Leslye Peer, MD PCP is Darrow Bussing, MD   HPI:  Patient is a 57 year old female with complex past medical history who returns to the office today for follow-up of the left Pleurx catheter originally placed 10/21/2015 for recurrent left pleural effusion in the setting of long-standing chronic combined systolic and diastolic congestive heart failure status post mitral valve repair, ligation of patent ductus arteriosus, and clipping of left atrial appendage on 07/22/2015.   The patient has struggled with worsening symptoms of congestive heart failure and has been followed closely by Dr. Gala Romney.  Recently she has been diagnosed with amyloidosis.  She returns to our office today because her Pleurx catheter has stopped draining.  She still has significant exertional shortness of breath.  She denies orthopnea.  She has not had fevers, chills, or productive cough.  She has no significant chest pain.   Current Outpatient Medications  Medication Sig Dispense Refill  . aspirin 81 MG tablet Take 81 mg by mouth daily.    . Droxidopa (NORTHERA) 200 MG CAPS Take 1 capsule by mouth 3 (three) times daily.    . ivabradine (CORLANOR) 5 MG TABS tablet Take 1 tablet (5 mg total) by mouth 2 (two) times daily with a meal. 180 tablet 3  . midodrine (PROAMATINE) 10 MG tablet Take 1 tablet (10 mg total) by mouth 3 (three) times daily with meals. 270 tablet 3  . potassium chloride (K-DUR) 10 MEQ tablet Take 1 tablet (10 mEq total) by mouth daily. 30 tablet 6  . torsemide (DEMADEX) 20 MG tablet Take 1 tablet (20 mg total) by mouth daily. 30 tablet 6   No current facility-administered medications for this visit.       Physical  Exam:   BP 124/89 (BP Location: Right Arm, Patient Position: Sitting, Cuff Size: Small) Comment (Cuff Size): PEDS  Pulse 84   Resp 18   Ht 5\' 2"  (1.575 m)   Wt 93 lb (42.2 kg)   SpO2 99% Comment: ON RA  BMI 17.01 kg/m   General:  Frail and chronically ill-appearing  Chest:   Bibasilar inspiratory crackles with slightly diminished breath sounds left lung base  CV:   Regular rate and rhythm without murmur  Incisions:  n/a  Abdomen:  Soft nontender  Extremities:  Warm and well-perfused, no edema  Diagnostic Tests:  CHEST - 2 VIEW  COMPARISON:  12/20/2016.  FINDINGS: Stable cardiomediastinal silhouette. LEFT atrial appendage clip. Aortic valve replacement. Dual lead pacer. Moderate size LEFT pleural effusion, LEFT pleural drain. Retrocardiac density could represent effusion with atelectasis, possible superimposed infiltrate. Mild blunting RIGHT CP angle, minimally worse.  IMPRESSION: Increased LEFT base opacity, consistent with moderate-sized LEFT effusion, with superimposed atelectasis and possible infiltrate. LEFT pleural drain appears unchanged in position.   Electronically Signed   By: Elsie Stain M.D.   On: 11/23/2017 13:19    Impression:  Patient's indwelling Pleurx catheter has stopped functioning.  It was originally placed 2 years ago.  It is likely that the catheter is occluded.  Chest x-ray performed today demonstrates a relatively small amount of pleural fluid on the left side.  Under the circumstances I  think it would likely be a mistake to unclog her old catheter, as this might only lead towards development of infection.  I feel it would be best to remove the catheter and consider replacement of the new catheter if her pleural effusion re-accumulates.  I am hopeful that it may not.  Plan:  I have discussed matters at length with the patient in the office today.  We will arrange for removal of her old Pleurx catheter as soon as practical.  I have  offered to schedule the patient for follow-up appointment with chest x-ray in 4 to 6 weeks.  She desires to hold off and only return if she develops worsening shortness of breath.  All questions answered.  I spent in excess of 15 minutes during the conduct of this office consultation and >50% of this time involved direct face-to-face encounter with the patient for counseling and/or coordination of their care.   Salvatore Decent. Cornelius Moras, MD 11/23/2017 1:49 PM

## 2017-11-25 ENCOUNTER — Encounter (HOSPITAL_COMMUNITY)
Admission: RE | Disposition: A | Discharge status: Home / Self Care | Payer: Self-pay | Source: Ambulatory Visit | Attending: Thoracic Surgery (Cardiothoracic Vascular Surgery)

## 2017-11-25 ENCOUNTER — Telehealth: Payer: Self-pay

## 2017-11-25 ENCOUNTER — Ambulatory Visit (HOSPITAL_COMMUNITY)
Admission: RE | Admit: 2017-11-25 | Discharge: 2017-11-25 | Disposition: A | Discharge status: Home / Self Care | Payer: Medicare HMO | Source: Ambulatory Visit | Attending: Thoracic Surgery (Cardiothoracic Vascular Surgery) | Admitting: Thoracic Surgery (Cardiothoracic Vascular Surgery)

## 2017-11-25 DIAGNOSIS — Y831 Surgical operation with implant of artificial internal device as the cause of abnormal reaction of the patient, or of later complication, without mention of misadventure at the time of the procedure: Secondary | ICD-10-CM | POA: Insufficient documentation

## 2017-11-25 DIAGNOSIS — Z79899 Other long term (current) drug therapy: Secondary | ICD-10-CM | POA: Diagnosis not present

## 2017-11-25 DIAGNOSIS — E859 Amyloidosis, unspecified: Secondary | ICD-10-CM | POA: Insufficient documentation

## 2017-11-25 DIAGNOSIS — Z7982 Long term (current) use of aspirin: Secondary | ICD-10-CM | POA: Diagnosis not present

## 2017-11-25 DIAGNOSIS — T85618A Breakdown (mechanical) of other specified internal prosthetic devices, implants and grafts, initial encounter: Secondary | ICD-10-CM | POA: Insufficient documentation

## 2017-11-25 DIAGNOSIS — I5042 Chronic combined systolic (congestive) and diastolic (congestive) heart failure: Secondary | ICD-10-CM | POA: Insufficient documentation

## 2017-11-25 DIAGNOSIS — J90 Pleural effusion, not elsewhere classified: Secondary | ICD-10-CM

## 2017-11-25 HISTORY — PX: REMOVAL OF PLEURAL DRAINAGE CATHETER: SHX5080

## 2017-11-25 SURGERY — REMOVAL, CLOSED DRAINAGE CATHETER SYSTEM, PLEURAL
Anesthesia: Monitor Anesthesia Care | Laterality: Left

## 2017-11-25 MED ORDER — LIDOCAINE HCL (PF) 1 % IJ SOLN
INTRAMUSCULAR | Status: AC
  Filled 2017-11-25: qty 30

## 2017-11-25 MED ORDER — LIDOCAINE HCL (PF) 1 % IJ SOLN
INTRAMUSCULAR | Status: AC
  Filled 2017-11-25: qty 5

## 2017-11-25 NOTE — Progress Notes (Signed)
      [address]       [place] [address]             [phone]     Brief H+ P: Patient is a 57 year old female with complex medical history being seen in the short stay for left Pleurx removal.  It was originally placed 2 years ago for congestive heart failure with chronic pleural effusions.  It is no longer functioning and Dr. Cornelius Moras feels as though it should be removed at this time.  PE: General: Adult female in no acute distress, she appears chronically ill. Lungs: Mildly diminished in the bases otherwise clear Cardiac: Regular rate and rhythm, no murmur Pleurx catheter site: No evidence of infection.   Brief procedure: Prep: Betadine Anesthesia: 10 cc 1% local lidocaine Technique: The left Pleurx catheter was removed intact using routine technique. Patient tolerated procedure well Suture: Number one 3-0 nylon to close the tiny Pleurx insertion site Dry sterile dressing applied  Rowe Clack, PA-C

## 2017-11-25 NOTE — Telephone Encounter (Signed)
VM left for patient to schedule visit with Palliative Care.  

## 2017-11-28 ENCOUNTER — Encounter (HOSPITAL_COMMUNITY): Payer: Self-pay | Admitting: Thoracic Surgery (Cardiothoracic Vascular Surgery)

## 2017-11-28 ENCOUNTER — Telehealth: Payer: Self-pay

## 2017-11-28 NOTE — Telephone Encounter (Signed)
Received return vm. Returned call to patient to schedule a visit with Palliative Care. Patient requested to wait until the following week to schedule visit. Visit scheduled for 12/06/17

## 2017-11-29 ENCOUNTER — Ambulatory Visit: Payer: Medicare HMO | Admitting: Thoracic Surgery (Cardiothoracic Vascular Surgery)

## 2017-11-29 LAB — CUP PACEART REMOTE DEVICE CHECK
Battery Remaining Longevity: 114 mo
Battery Voltage: 3.01 V
Brady Statistic RA Percent Paced: 0.46 %
HighPow Impedance: 40 Ohm
Implantable Lead Implant Date: 20170127
Implantable Lead Location: 753859
Implantable Lead Location: 753860
Implantable Lead Model: 5076
Implantable Pulse Generator Implant Date: 20170127
Lead Channel Impedance Value: 342 Ohm
Lead Channel Pacing Threshold Pulse Width: 0.4 ms
Lead Channel Sensing Intrinsic Amplitude: 2.25 mV
Lead Channel Sensing Intrinsic Amplitude: 2.25 mV
Lead Channel Sensing Intrinsic Amplitude: 6.125 mV
Lead Channel Sensing Intrinsic Amplitude: 6.125 mV
Lead Channel Setting Pacing Amplitude: 1.5 V
Lead Channel Setting Pacing Pulse Width: 0.4 ms
MDC IDC LEAD IMPLANT DT: 20170127
MDC IDC MSMT LEADCHNL RA IMPEDANCE VALUE: 361 Ohm
MDC IDC MSMT LEADCHNL RA PACING THRESHOLD AMPLITUDE: 0.625 V
MDC IDC MSMT LEADCHNL RV IMPEDANCE VALUE: 247 Ohm
MDC IDC MSMT LEADCHNL RV PACING THRESHOLD AMPLITUDE: 0.75 V
MDC IDC MSMT LEADCHNL RV PACING THRESHOLD PULSEWIDTH: 0.4 ms
MDC IDC SESS DTM: 20190502062823
MDC IDC SET LEADCHNL RV PACING AMPLITUDE: 2 V
MDC IDC SET LEADCHNL RV SENSING SENSITIVITY: 0.3 mV
MDC IDC STAT BRADY AP VP PERCENT: 0 %
MDC IDC STAT BRADY AP VS PERCENT: 0.46 %
MDC IDC STAT BRADY AS VP PERCENT: 0.03 %
MDC IDC STAT BRADY AS VS PERCENT: 99.51 %
MDC IDC STAT BRADY RV PERCENT PACED: 0.03 %

## 2017-11-30 ENCOUNTER — Telehealth (HOSPITAL_COMMUNITY): Payer: Self-pay | Admitting: *Deleted

## 2017-11-30 NOTE — Telephone Encounter (Signed)
Medication Samples have been provided to the patient.  Drug name: Corlanor    Strength: 5 mg        Qty: 2  LOT: [id number]  Exp.Date: 07/21  Dosing instructions: Take 1 Tablet Twice Daily  The patient has been instructed regarding the correct time, dose, and frequency of taking this medication, including desired effects and most common side effects.   Georgina Peer 11:01 AM 11/30/2017

## 2017-12-02 ENCOUNTER — Ambulatory Visit (INDEPENDENT_AMBULATORY_CARE_PROVIDER_SITE_OTHER): Payer: Self-pay | Admitting: *Deleted

## 2017-12-02 DIAGNOSIS — Z9889 Other specified postprocedural states: Secondary | ICD-10-CM

## 2017-12-02 DIAGNOSIS — J90 Pleural effusion, not elsewhere classified: Secondary | ICD-10-CM

## 2017-12-02 DIAGNOSIS — Z4802 Encounter for removal of sutures: Secondary | ICD-10-CM

## 2017-12-02 NOTE — Progress Notes (Signed)
Diane [name] returns to the office for one suture removal s/p removal of L pleurX catheter on 11/25/17. The site is well healed. Her lungs are clear to auscultation. She will return PRN.

## 2017-12-06 ENCOUNTER — Other Ambulatory Visit: Payer: Medicare HMO | Admitting: Internal Medicine

## 2017-12-06 ENCOUNTER — Telehealth (HOSPITAL_COMMUNITY): Payer: Self-pay | Admitting: *Deleted

## 2017-12-06 DIAGNOSIS — Z515 Encounter for palliative care: Secondary | ICD-10-CM

## 2017-12-06 NOTE — Telephone Encounter (Signed)
Corlanor PA approved through [employer].  [employer] patient assistance application faxed today to [phone].

## 2017-12-07 ENCOUNTER — Encounter: Payer: Self-pay | Admitting: Internal Medicine

## 2017-12-07 NOTE — Progress Notes (Signed)
12/06/2017  PALLIATIVE CARE CONSULT [place], [place]. Jun 25, 1961. [address] [place] [address], (H/M) [phone]  REFERRING PROVIDER: Dr. Lyndee Hensen (11/16/2017), PCP Dr. Darrow Bussing Family: (EX Spouse) Joliet Mallozzi (H/M) [phone]  BILLABLE  ICD-10: IMPRESSION/RECOMMENDATIONS:  1. Weakness When lying flat, feels remarkably well; almost normal. However, as soon as sits up or stirs about, becomes totally exhausted, fatigued, and dyspenic. Notes marked and fast progression in fatigue/lack of strength. Using walker for last 2 months. Abmulates with very slow gait. Forces herself to ambulate/stir about  as she doesn't want to lose what muscle tone she has. Sleeps majority of the day; yesterday slept through two meals. Since the beginning of this week started sleeping on couch (as is closer to bathroom). She fell in the bathroom a few days ago due to lightheadedness/fatigue; no injuries. Doesn't wish to consider bedside commode ("it would smell").  2. Diarrhea: Frequent episodes loose stool throughout the day. She is resistant to try antidiarrheals because she is worried that use of these agents might cause rebound constipation. Her past experiences with constipation were much harder for her to manage/marked discomfort, than the current diarrhea is. Patient mentions that she has been on antibiotics in the past for the diarrhea, which caused severe abdominal cramping. She has trialed probiotics in the past, without impact. She follows a lactose free diet. She notes no intolerance to gluten; essentially follows a gluten free diet in any event.   a. Encourage use of yogurt as a probiotic (though contains lactose, the bacteria in yogurt possess the lactase enzyme).  b. Encourage gentle gut anti-motility agent such as Kaopectate (mentioned possible harmless side effect of black or darkened tongue).  3. Pain: Chronic anterior chest pain "like a heavy stone pressing down"; no  exacerbating or relieving factos.  Also suffers with peripheral neuropathy so that she easily unknowingly injuries her fingers/extremities and later note the cuts or bruising. Random, overall various parts of her body "stabbing pains" on a daily basis. Also gets daily LE leg spasms ("[place]" which improve with standing up and leaning forward. Resistant to taking any pain medication ("I don't like taking pills").  4. Coping; Currently staying in home of her ex-husband who assists with some care, but is not overly involved (he is my "ex"  husband for a reason!").  She speaks openly of her poor prognosis (anticipates prognosis short months) and is open to Hospice Care if infusion therapy with patisiran is unhelpful (has not yet begun treatment), or if it causes intolerable side effects. She does not want to die at home, and we discussed availability of [facility] (hospice hospital) with admission criteria of anticipated last 2 weeks or so of  life. Patient has 2 children, a daughter Elige Radon who lives in [place]; visited last week for patient's birthday. Son Madoline Bhatt who is in the [employer]) and is scheduled to leave for [place] June 3rd. Patient wishes to avoid worrying/ bothering her children. Patient's sister is a physician in [place]; they speak on a daily basis and her sister is very grieved over patient's illness; she loves her sister very much.   a. Palliative Care SW to consult; supportive counseling setting of serious illness.   5. Protein Calorie Malnutrition: (11/16/2017) Wt 94, BMI 17.91. Eats 50% of 3 small meals/d.  Rice, soup, noodles, meat, eggs. No appetite because of decreased taste. Resistant to liquid supplements.  6. Advanced Care Planning:   DNR/MOST forms and written educational materials reviewed/discussed, completed, and  left in home. Patient wishes DNR. She wishes comfort measures only; no transportation to hospital unless comfort measures at  home are unsuccessful. No IVFs, no antibiotics, no tube feedings.     7. Follow up:  a. NP visit in approximately next 2 weeks.  I spent 75 minutes providing this consultation (from  10am to 11:15am). More than 50% of that time was spent coordinating communication.  HPI: 57 yo female with end stage TTR and amyloidosis, with progressive weight loss, neuropathy, and HF NYHA IV. Anticipate initiation of salvage therapy with patisiran, in the hopes that can afford reduced discomfort from neuropathy. Patient recognizes that she is nearing EOL. Referred to Palliative Care for assistance with advanced care planning/ help with determining goals of care  CODE STATUS: DNR PPS: weak 40%. 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  Continues dyspnic with conversation. Excessive drooling over last few days. Noted left ear bloody drainage left message with PCG. Now stopped but feel strangs; second time. HOSPICE ELIGIBILITY: Yes, if discontinues palliative infusion therapy with patisiran (pending trial)  MEDICATIONS :  aspirin 81mg  qd, droxidopa (Northera) 200mg  caps tid, ivabradine (Corlanor) 5mg  bid w/meals, midodrine 10mg  tid, potassium chloride soln 15mg  qd prn, torsemide (Demadex) 20mg  qd, orthostatic hypotension (autonomic dysfunction due to TTR neuropathy, sever physical deconditioning (due to TTR neuropathy; wheelchair bound. 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 NYHA class IIIB-IV. Progressive PND.  Palliative infusion therapy with patisiran pnd insurance coverage -autonomic dysfunction  -neuropathic herodofamilial amyloidosis AICD (present), anemia, anxiety, aortic aneurysm, arthritis, cardiac arrest, depression,  mitral valve repair for severe mitral regurgitation,/ligation of patent ductus arteriosus, SOB/dyspnea, pleural effusions (L pleurex draining qod), paroxysmal atrial fibrillation (current NSR)  PHYSICAL EXAM: Thin, chronically ill appearing, fatigued  VS: BP 114 systolic by palpation (unable to auscultate to hear diastolic), HR 71, Sats 99% room air, RR 24 HEENT: [place], AT LUNGS:  End insp faint crackles bibasilar CARDIAC: RRR without MRG ABD: soft, non-distended. NABS EXTREMITIES:  mildly pitting edema to lower calf MUSCULOSKELETAL:  no contractures SKIN:  warm, dry and intact NEURO:  A & O X 3  Diane Smet NP-C

## 2017-12-08 ENCOUNTER — Other Ambulatory Visit: Payer: Self-pay | Admitting: *Deleted

## 2017-12-08 ENCOUNTER — Telehealth: Payer: Self-pay | Admitting: Licensed Clinical Social Worker

## 2017-12-08 NOTE — Telephone Encounter (Signed)
Palliative Care SW left a vm with patient to set up a home visit. 

## 2017-12-13 ENCOUNTER — Other Ambulatory Visit: Payer: Medicare HMO | Admitting: Licensed Clinical Social Worker

## 2017-12-13 ENCOUNTER — Ambulatory Visit: Payer: Medicare HMO | Admitting: Internal Medicine

## 2017-12-13 ENCOUNTER — Encounter: Payer: Self-pay | Admitting: Internal Medicine

## 2017-12-13 VITALS — BP 124/80 | HR 76 | Ht 62.0 in | Wt 100.6 lb

## 2017-12-13 DIAGNOSIS — I5022 Chronic systolic (congestive) heart failure: Secondary | ICD-10-CM | POA: Diagnosis not present

## 2017-12-13 DIAGNOSIS — J90 Pleural effusion, not elsewhere classified: Secondary | ICD-10-CM

## 2017-12-13 DIAGNOSIS — I4729 Other ventricular tachycardia: Secondary | ICD-10-CM

## 2017-12-13 DIAGNOSIS — I480 Paroxysmal atrial fibrillation: Secondary | ICD-10-CM

## 2017-12-13 DIAGNOSIS — I429 Cardiomyopathy, unspecified: Secondary | ICD-10-CM

## 2017-12-13 DIAGNOSIS — I472 Ventricular tachycardia: Secondary | ICD-10-CM | POA: Diagnosis not present

## 2017-12-13 DIAGNOSIS — I951 Orthostatic hypotension: Secondary | ICD-10-CM | POA: Diagnosis not present

## 2017-12-13 NOTE — Progress Notes (Signed)
Patient Care Team: Darrow Bussing, MD as PCP - General (Family Medicine) Anselm Lis, NP as Nurse Practitioner (Hospice and Palliative Medicine)   HPI  Diane [name] is a 57 y.o. female Seen in follow-up for autonomic dysfunction and severe orthostasis.  She's a history of mitral valve repair for severe MR associated with left atrial clipping and closure of the PDA. She was discharged with a unremarkable postoperative course and then had a cardiac arrest at home. She was treated with hypothermia. MRI scanning demonstrated ejection fraction 45% with diffuse delayed enhancement. An ICD was implanted for secondary prevention.  In the wake of all of this she developed severe orthostasis and recurrent syncope requiring prolonged hospitalization and almost every trick I knew  Because of ongoing symptoms, she was also seen by Pattricia Boss tilt table testing 11/17 showed profound hypotension.  She was started on Mestinon but was intolerant.  Repeat MRI again showed delayed enhancement concerning for infiltrative disease such as amyloid.  Technetium pyrophosphate scan 10/18 was abnormal and she underwent genetic testing with evidence of a pathogenic T TR agitation identified in the [place]  Population  She has very weak voice and stamina--ready to die; knows God  Past Medical History:  Diagnosis Date  . AICD (automatic cardioverter/defibrillator) present   . Anemia   . Anxiety   . Aortic aneurysm (HCC)   . Arthritis   . Cardiac arrest (HCC)   . Chronic combined systolic and diastolic CHF (congestive heart failure) (HCC) 07/03/2015   Grade 3 diastolic dysfunction.  LVEF 40-45% 05/2015.  Marland Kitchen Depression    SOME DEPRESSION (ON MEDS FOR 2 YRS) WHEN HER MOTHER PASSED  . Patent ductus arteriosus 07/16/2015  . 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 07/22/2015   26 mm Sorin Memo 3D ring annuloplasty with ligation of patent ductus arteriosus and clipping of LA  appendage  . Severe mitral regurgitation 07/03/2015  . Shortness of breath dyspnea     Past Surgical History:  Procedure Laterality Date  . CARDIAC CATHETERIZATION N/A 07/08/2015   Procedure: Right/Left Heart Cath and Coronary Angiography;  Surgeon: Marykay Lex, MD;  Location: [facility] INVASIVE CV LAB;  Service: Cardiovascular;  Laterality: N/A;  . CHEST TUBE INSERTION Left 10/21/2015   Procedure: INSERTION left PLEURAL DRAINAGE CATHETER;  Surgeon: Purcell Nails, MD;  Location: [facility] OR;  Service: Thoracic;  Laterality: Left;  . CLIPPING OF ATRIAL APPENDAGE  07/22/2015   Procedure: CLIPPING OF LEFT ATRIAL APPENDAGE;  Surgeon: Purcell Nails, MD;  Location: [facility] OR;  Service: Open Heart Surgery;;  35 [address] Pro  . COLONOSCOPY    . EP IMPLANTABLE DEVICE N/A 08/15/2015   Procedure: ICD Implant;  Surgeon: Will Jorja Loa, MD;  Location: [facility] INVASIVE CV LAB;  Service: Cardiovascular;  Laterality: N/A;  . MICROLARYNGOSCOPY WITH CO2 LASER AND EXCISION OF VOCAL CORD LESION N/A 11/04/2015   Procedure: MICROLARYNGOSCOPY, RADIESSE VOCAL CORD AUGMENTATION;  Surgeon: Flo Shanks, MD;  Location[facility] OR;  Service: ENT;  Laterality: N/A;  . MITRAL VALVE REPAIR N/A 07/22/2015   Procedure: MITRAL VALVE REPAIR;  Surgeon: Purcell Nails, MD;  Location: [facility] OR;  Service: Open Heart Surgery;  Laterality: N/A;  26 Sorin Memo 3D  . OVARIAN CYST REMOVAL    . PATENT DUCTUS ARTERIOUS REPAIR N/A 07/22/2015   Procedure: PATENT DUCTUS ARTERIOSUS (PDA) ligation;  Surgeon: Purcell Nails, MD;  Location: [facility] OR;  Service: Open Heart Surgery;  Laterality: N/A;  .  REMOVAL OF PLEURAL DRAINAGE CATHETER Left 11/25/2017   Procedure: REMOVAL OF PLEURAL DRAINAGE CATHETER;  Surgeon: Purcell Nails, MD;  Location: [facility] OR;  Service: Thoracic;  Laterality: Left;  . TEE WITHOUT CARDIOVERSION N/A 07/07/2015   Procedure: TRANSESOPHAGEAL ECHOCARDIOGRAM (TEE);  Surgeon: Chilton Si, MD;  Location: [facility] ENDOSCOPY;  Service: Cardiovascular;   Laterality: N/A;  . TEE WITHOUT CARDIOVERSION N/A 07/22/2015   Procedure: TRANSESOPHAGEAL ECHOCARDIOGRAM (TEE);  Surgeon: Purcell Nails, MD;  Location: [facility] OR;  Service: Open Heart Surgery;  Laterality: N/A;    Current Outpatient Medications  Medication Sig Dispense Refill  . aspirin EC 81 MG tablet Take 81 mg by mouth daily at 3 pm.    . ivabradine (CORLANOR) 5 MG TABS tablet Take 1 tablet (5 mg total) by mouth 2 (two) times daily with a meal. (Patient taking differently: Take 5 mg by mouth 2 (two) times daily with a meal. 0700 &1900) 180 tablet 3  . midodrine (PROAMATINE) 10 MG tablet Take 1 tablet (10 mg total) by mouth 3 (three) times daily with meals. 270 tablet 3  . Polyethyl Glycol-Propyl Glycol (LUBRICANT EYE DROPS) 0.4-0.3 % SOLN Place 1 drop into both eyes 2 (two) times daily.    . potassium chloride (K-DUR) 10 MEQ tablet Take 1 tablet (10 mEq total) by mouth daily. (Patient taking differently: Take 10 mEq by mouth daily at 6 (six) AM. ) 30 tablet 6  . torsemide (DEMADEX) 20 MG tablet Take 1 tablet (20 mg total) by mouth daily. (Patient taking differently: Take 20 mg by mouth daily at 6 (six) AM. ) 30 tablet 6   No current facility-administered medications for this visit.     Allergies  Allergen Reactions  . Isopropyl Alcohol Hives and Itching    ALCOHOL SWABS  . Vicodin [Hydrocodone-Acetaminophen] Other (See Comments)    "she feels out of it", hallucinating  . Adhesive [Tape] Itching and Rash  . Cymbalta [Duloxetine Hcl] Nausea And Vomiting and Other (See Comments)    Excessive dizziness  . Oxycodone Nausea Only and Other (See Comments)    Makes her feel strange   . Silicone Itching and Rash    TAPE ALLERGY/EKG STICKER ALLERGY, Please use "paper" tape only      Review of Systems negative except from HPI and PMH  Physical Exam BP 124/80   Pulse 76   Ht 5\' 2"  (1.575 m)   Wt 100 lb 9.6 oz (45.6 kg)   SpO2 99%   BMI 18.40 kg/m  Well developed and very thin  in no  acute distress HENT normal Neck supple with JVP-flat Clear Regular rate and rhythm, no murmurs or gallops Abd-soft with active BS No Clubbing cyanosis edema Skin-warm and dry A & Oriented  Grossly normal sensory and motor function   ECG sinus 76 19/13/46 IVCD  Assessment and  Plan  Aborted cardiac arrest  Mitral valve repair/LA clipping/PDA ligation  Orthostatic intolerance/autonomic dysfunction  TTR/ amyloid  ICD     She is deteriorating and hopeful that she can get the infusions started but also at peace with dying though sad with her 8 yr old daughter and 8 yr old son  Her ex husband is caring for her.  I did not talk with her about ICD inactivation -- will ask DB to ask-- I just dropped the ball  We spent more than 50% of our >25 min visit in face to face counseling regarding the above

## 2017-12-13 NOTE — Patient Instructions (Signed)

## 2017-12-14 LAB — CUP PACEART INCLINIC DEVICE CHECK
Battery Voltage: 3.01 V
Brady Statistic AP VP Percent: 0 %
Brady Statistic AP VS Percent: 0.38 %
Brady Statistic AS VP Percent: 0.03 %
Brady Statistic AS VS Percent: 99.59 %
HighPow Impedance: 37 Ohm
Implantable Lead Implant Date: 20170127
Implantable Pulse Generator Implant Date: 20170127
Lead Channel Impedance Value: 361 Ohm
Lead Channel Pacing Threshold Amplitude: 0.75 V
Lead Channel Pacing Threshold Pulse Width: 0.4 ms
Lead Channel Pacing Threshold Pulse Width: 0.4 ms
Lead Channel Sensing Intrinsic Amplitude: 1.125 mV
Lead Channel Sensing Intrinsic Amplitude: 6.125 mV
Lead Channel Sensing Intrinsic Amplitude: 6.375 mV
Lead Channel Setting Pacing Amplitude: 1.5 V
MDC IDC LEAD IMPLANT DT: 20170127
MDC IDC LEAD LOCATION: 753859
MDC IDC LEAD LOCATION: 753860
MDC IDC MSMT BATTERY REMAINING LONGEVITY: 113 mo
MDC IDC MSMT LEADCHNL RA PACING THRESHOLD AMPLITUDE: 0.75 V
MDC IDC MSMT LEADCHNL RA SENSING INTR AMPL: 1.125 mV
MDC IDC MSMT LEADCHNL RV IMPEDANCE VALUE: 228 Ohm
MDC IDC MSMT LEADCHNL RV IMPEDANCE VALUE: 304 Ohm
MDC IDC SESS DTM: 20190528182941
MDC IDC SET LEADCHNL RV PACING AMPLITUDE: 2 V
MDC IDC SET LEADCHNL RV PACING PULSEWIDTH: 0.4 ms
MDC IDC SET LEADCHNL RV SENSING SENSITIVITY: 0.3 mV
MDC IDC STAT BRADY RA PERCENT PACED: 0.38 %
MDC IDC STAT BRADY RV PERCENT PACED: 0.03 %

## 2017-12-20 ENCOUNTER — Telehealth (HOSPITAL_COMMUNITY): Payer: Self-pay | Admitting: *Deleted

## 2017-12-20 NOTE — Telephone Encounter (Signed)
  She needs to call provider that prescribed northera.   She can contact her pharmacy to identify the provider that prescribed northera.  Keslie Gritz 12:27 PM

## 2017-12-20 NOTE — Telephone Encounter (Signed)
Patient no longer sees the ordering provider (Dr. Chilton Si).  Meredith Staggers, RN is aware and will discuss with Dr. Gala Romney.

## 2017-12-20 NOTE — Telephone Encounter (Signed)
Advanced Heart Failure Triage Encounter  Patient Name: Diane [name]  Date of Call: 12/20/17  Problem:  Patient called stating she has been taking Northera 200 mg TID for 3 years now.  Medication is not found on her list, I confirmed that she has also been taking midodrine 10 mg TID as well.  She stated that she is unable to afford Northera and her BP is low at 86/52.  Patient feels tired and weak today. I asked who has been filling Northera and she stated at a specialty pharmacy but unable to tell me the provider.   She was also asking about Corlanor assistance.  I originally faxed [employer] safety net application 12/06/2017 but haven't received response. I called [employer] and they never received application.  I have faxed it again today and will wait for approval.  Patient currently still has enough corlanor at home for now.    Plan:  Will forward to Tonye Becket, NP to review low BP/Northera issue and will call patient back with her response.   Georgina Peer, RN

## 2017-12-20 NOTE — Telephone Encounter (Signed)
Per the request of NP with Palliative Care, Holly Bodily, SW spoke with patient and made an appointment for 9am on Thursday, 6/6.

## 2017-12-22 ENCOUNTER — Other Ambulatory Visit: Payer: Medicare HMO | Admitting: Licensed Clinical Social Worker

## 2017-12-22 ENCOUNTER — Encounter (HOSPITAL_COMMUNITY): Payer: Medicare HMO

## 2017-12-22 DIAGNOSIS — Z515 Encounter for palliative care: Secondary | ICD-10-CM

## 2017-12-22 MED ORDER — MIDODRINE HCL 10 MG PO TABS: 15.0000 mg | ORAL_TABLET | Freq: Three times a day (TID) | ORAL | 3 refills | Status: DC

## 2017-12-22 NOTE — Addendum Note (Signed)
Addended by: Noralee Space on: 12/22/2017 04:30 PM   Modules accepted: Orders

## 2017-12-22 NOTE — Progress Notes (Signed)
COMMUNITY PALLIATIVE CARE SW NOTE  PATIENT NAME: Diane [name] DOB: 08-Jan-1961 MRN: [id number]  PRIMARY CARE PROVIDER: Lujean Amel, MD  RESPONSIBLE PARTY:  Acct ID - Guarantor Home Phone Work Phone Relationship Acct Type  [id number] Wyline [phone]  Self P/F     [address], [place], [place] [address]     PLAN OF CARE and INTERVENTIONS:             1. GOALS OF CARE/ ADVANCE CARE PLANNING:  Patient does not wish to be hospitalized if possible.  She has a DNR and MOST form. 2. SOCIAL/EMOTIONAL/SPIRITUAL ASSESSMENT/ INTERVENTIONS:  Patient is a 57 y/o divorced [place] female who resides with her ex-husband.  She moved to [place] from [place].  Patient's sister and daughter live in [place].  Patient's son is in [employer] and recently became stationed in [place].  SW met with patient after receiving a referral from Palliative Care NP, Violeta Gelinas, for supportive counseling.  Patient appeared to respond positively to life review.  She is a [place] and chooses not to be affiliated with church currently.  SW observed patient ambulate with her walker in her home.  She was very slow and remained short of breath throughout the visit.  Patient is very in tune to her emotions and illness.  She stated since being approved for the infusion therapy, she has experienced some home that she may survive "a little longer" in order to see her son again.  She said she is not afraid of death and that her faith has helped her immensely.  Pt became increasingly fatigued during the visit.  SW provided active listening and validated her feelings. 3. PATIENT/CAREGIVER EDUCATION/ COPING:  Patient is very open when discussing her feelings and medical condition. 4. PERSONAL EMERGENCY PLAN:  Patient remains very independent.  She speaks with her sister daily, who is a physician.   5. COMMUNITY RESOURCES COORDINATION/ HEALTH CARE NAVIGATION:  Patient does not currently receive community  resources. 6. FINANCIAL/LEGAL CONCERNS/INTERVENTIONS:  She is currently on disability and has Medicare.     SOCIAL HX:  Social History   Tobacco Use  . Smoking status: Never Smoker  . Smokeless tobacco: Never Used  Substance Use Topics  . Alcohol use: No    Alcohol/week: 0.0 oz    CODE STATUS:   Code Status: DNR  ADVANCED DIRECTIVES: N MOST FORM COMPLETE:  Yes HOSPICE EDUCATION PROVIDED:  Per NP, patient is familiar with Hospice. Duration of visit and documentation:  90 minutes.  Patient stated she will probably want to meet again once her infusions begin.  PPS: Patient reports having poor intake due to her change in taste.  Patient is able to stand independently and walk with her walker.      Creola Corn Kaitelyn Jamison, LCSW

## 2017-12-22 NOTE — Telephone Encounter (Signed)
Spoke with Herbert Seta who states she is still unable to get northera approved. Per Amy if patient is feeling that bad she needs to be seen in the emergency room. I called patient she states she feels she may "pass out" and does not feel safe staying at home. Patient was hesitant about going to the emergency room because she does not want to be admitted. After explaining to the patient that she really needs to go to the emergency room to be evaluated/treated so that she could start to feel better she finally agreed to go to the ED.

## 2017-12-22 NOTE — Telephone Encounter (Signed)
Per Dr Gala Romney pt should stop Northera when she runs out and increase Midodrine to 15 mg TID.  Spoke w/pt, she is out of Northera, she will increase Midodrine today.

## 2017-12-22 NOTE — Telephone Encounter (Signed)
Patient calling back to follow up on Northera. States was told by triage RN last week Dr. Gala Romney or his RN would follow up with what to do regarding inability to get Northera and has not heard back.  Also has still not been able to get medication and fears her BP is dropping as she reports increasing dizziness and weakness and fatigue.  Patient fearful to stay at home and would like to have appt. Will add on to APP schedule in CHF clinic to examine patient.  Ave Filter, RN

## 2018-01-10 DIAGNOSIS — I5042 Chronic combined systolic (congestive) and diastolic (congestive) heart failure: Secondary | ICD-10-CM | POA: Diagnosis not present

## 2018-01-10 DIAGNOSIS — E854 Organ-limited amyloidosis: Secondary | ICD-10-CM | POA: Diagnosis not present

## 2018-01-10 DIAGNOSIS — I480 Paroxysmal atrial fibrillation: Secondary | ICD-10-CM | POA: Diagnosis not present

## 2018-01-10 DIAGNOSIS — I43 Cardiomyopathy in diseases classified elsewhere: Secondary | ICD-10-CM | POA: Diagnosis not present

## 2018-01-10 DIAGNOSIS — I951 Orthostatic hypotension: Secondary | ICD-10-CM | POA: Diagnosis not present

## 2018-02-01 ENCOUNTER — Other Ambulatory Visit (HOSPITAL_COMMUNITY): Payer: Self-pay | Admitting: *Deleted

## 2018-02-01 DIAGNOSIS — E854 Organ-limited amyloidosis: Secondary | ICD-10-CM

## 2018-02-01 DIAGNOSIS — I43 Cardiomyopathy in diseases classified elsewhere: Secondary | ICD-10-CM

## 2018-02-03 ENCOUNTER — Telehealth (HOSPITAL_COMMUNITY): Payer: Self-pay | Admitting: *Deleted

## 2018-02-03 NOTE — Telephone Encounter (Signed)
Pt's Onpattro (Patisiran) infusion has been approved through [place], Berkley Harvey # [id number], first infusion is sch for [facility] 7/25, pt is aware

## 2018-02-08 ENCOUNTER — Telehealth (HOSPITAL_COMMUNITY): Payer: Self-pay | Admitting: Internal Medicine

## 2018-02-08 ENCOUNTER — Other Ambulatory Visit (HOSPITAL_COMMUNITY): Payer: Self-pay | Admitting: *Deleted

## 2018-02-09 ENCOUNTER — Telehealth (HOSPITAL_COMMUNITY): Payer: Self-pay | Admitting: *Deleted

## 2018-02-09 ENCOUNTER — Ambulatory Visit (HOSPITAL_COMMUNITY)
Admission: RE | Admit: 2018-02-09 | Discharge: 2018-02-09 | Disposition: A | Discharge status: Home / Self Care | Payer: Medicare HMO | Source: Ambulatory Visit | Attending: Internal Medicine | Admitting: Internal Medicine

## 2018-02-09 DIAGNOSIS — I43 Cardiomyopathy in diseases classified elsewhere: Secondary | ICD-10-CM

## 2018-02-09 DIAGNOSIS — E854 Organ-limited amyloidosis: Secondary | ICD-10-CM | POA: Insufficient documentation

## 2018-02-09 MED ORDER — SODIUM CHLORIDE 0.9 % IV SOLN
0.3000 mg/kg | Status: DC
  Administered 2018-02-09 (×2): 14 mg via INTRAVENOUS
  Filled 2018-02-09: qty 7

## 2018-02-09 MED ORDER — METHYLPREDNISOLONE SODIUM SUCC 125 MG IJ SOLR: 80.0000 mg | Freq: Once | INTRAMUSCULAR | Status: DC

## 2018-02-09 MED ORDER — ACETAMINOPHEN 325 MG PO TABS
650.0000 mg | ORAL_TABLET | Freq: Once | ORAL | Status: AC
  Administered 2018-02-09: 650 mg via ORAL

## 2018-02-09 MED ORDER — ACETAMINOPHEN 325 MG PO TABS
ORAL_TABLET | ORAL | Status: AC
  Administered 2018-02-09: 650 mg via ORAL
  Filled 2018-02-09: qty 2

## 2018-02-09 MED ORDER — LORATADINE 10 MG PO TABS
ORAL_TABLET | ORAL | Status: AC
  Administered 2018-02-09: 10 mg via ORAL
  Filled 2018-02-09: qty 1

## 2018-02-09 MED ORDER — VITAMIN A 10000 UNITS PO CAPS: 10000.0000 [IU] | ORAL_CAPSULE | Freq: Every day | ORAL | Status: AC

## 2018-02-09 MED ORDER — METHYLPREDNISOLONE SODIUM SUCC 40 MG IJ SOLR
INTRAMUSCULAR | Status: AC
  Administered 2018-02-09: 80 mg
  Filled 2018-02-09: qty 2

## 2018-02-09 MED ORDER — LORATADINE 10 MG PO TABS
10.0000 mg | ORAL_TABLET | Freq: Once | ORAL | Status: AC
  Administered 2018-02-09: 10 mg via ORAL

## 2018-02-09 MED ORDER — DIPHENHYDRAMINE HCL 50 MG/ML IJ SOLN
INTRAMUSCULAR | Status: AC
  Administered 2018-02-09: 25 mg via INTRAVENOUS
  Filled 2018-02-09: qty 1

## 2018-02-09 MED ORDER — FAMOTIDINE IN NACL 20-0.9 MG/50ML-% IV SOLN
20.0000 mg | Freq: Once | INTRAVENOUS | Status: AC
  Administered 2018-02-09: 20 mg via INTRAVENOUS
  Filled 2018-02-09: qty 50

## 2018-02-09 MED ORDER — DIPHENHYDRAMINE HCL 50 MG/ML IJ SOLN
25.0000 mg | Freq: Once | INTRAMUSCULAR | Status: AC
  Administered 2018-02-09: 25 mg via INTRAVENOUS

## 2018-02-09 NOTE — Telephone Encounter (Signed)
Pt started Onpattro infusion today and will be receiving every 3 weeks, per Dr Gala Romney pt will need to take Vitamin A 10,000 units daily as Onpattro can deplete level.  Pt is aware of this and will get Vit A at pharmacy

## 2018-02-13 NOTE — Telephone Encounter (Signed)
If she has having fevers and chills needs to be evaluated by PCP or go to ER.

## 2018-02-13 NOTE — Telephone Encounter (Signed)
Pt called complaining of SOB, fever, chills. Pt reporting symptoms 2 days after onpattro infusion. Please advise.

## 2018-02-16 ENCOUNTER — Telehealth: Payer: Self-pay

## 2018-02-16 ENCOUNTER — Ambulatory Visit (HOSPITAL_COMMUNITY)
Admission: RE | Admit: 2018-02-16 | Discharge: 2018-02-16 | Disposition: A | Discharge status: Home / Self Care | Payer: Medicare HMO | Source: Ambulatory Visit | Attending: Internal Medicine | Admitting: Internal Medicine

## 2018-02-16 ENCOUNTER — Ambulatory Visit (INDEPENDENT_AMBULATORY_CARE_PROVIDER_SITE_OTHER): Payer: Medicare HMO | Admitting: *Deleted

## 2018-02-16 VITALS — BP 117/48 | HR 71 | Wt 96.0 lb

## 2018-02-16 DIAGNOSIS — Z888 Allergy status to other drugs, medicaments and biological substances status: Secondary | ICD-10-CM | POA: Insufficient documentation

## 2018-02-16 DIAGNOSIS — F419 Anxiety disorder, unspecified: Secondary | ICD-10-CM | POA: Diagnosis not present

## 2018-02-16 DIAGNOSIS — J90 Pleural effusion, not elsewhere classified: Secondary | ICD-10-CM

## 2018-02-16 DIAGNOSIS — F329 Major depressive disorder, single episode, unspecified: Secondary | ICD-10-CM | POA: Insufficient documentation

## 2018-02-16 DIAGNOSIS — I472 Ventricular tachycardia: Secondary | ICD-10-CM | POA: Diagnosis not present

## 2018-02-16 DIAGNOSIS — Z95810 Presence of automatic (implantable) cardiac defibrillator: Secondary | ICD-10-CM | POA: Insufficient documentation

## 2018-02-16 DIAGNOSIS — I4729 Other ventricular tachycardia: Secondary | ICD-10-CM

## 2018-02-16 DIAGNOSIS — I5022 Chronic systolic (congestive) heart failure: Secondary | ICD-10-CM | POA: Diagnosis not present

## 2018-02-16 DIAGNOSIS — I480 Paroxysmal atrial fibrillation: Secondary | ICD-10-CM | POA: Insufficient documentation

## 2018-02-16 DIAGNOSIS — Z79899 Other long term (current) drug therapy: Secondary | ICD-10-CM | POA: Insufficient documentation

## 2018-02-16 DIAGNOSIS — I951 Orthostatic hypotension: Secondary | ICD-10-CM | POA: Insufficient documentation

## 2018-02-16 DIAGNOSIS — I5042 Chronic combined systolic (congestive) and diastolic (congestive) heart failure: Secondary | ICD-10-CM | POA: Diagnosis not present

## 2018-02-16 DIAGNOSIS — R69 Illness, unspecified: Secondary | ICD-10-CM | POA: Diagnosis not present

## 2018-02-16 DIAGNOSIS — I43 Cardiomyopathy in diseases classified elsewhere: Secondary | ICD-10-CM | POA: Diagnosis not present

## 2018-02-16 DIAGNOSIS — E859 Amyloidosis, unspecified: Secondary | ICD-10-CM | POA: Insufficient documentation

## 2018-02-16 DIAGNOSIS — Z885 Allergy status to narcotic agent status: Secondary | ICD-10-CM | POA: Diagnosis not present

## 2018-02-16 DIAGNOSIS — Z7982 Long term (current) use of aspirin: Secondary | ICD-10-CM | POA: Diagnosis not present

## 2018-02-16 DIAGNOSIS — G629 Polyneuropathy, unspecified: Secondary | ICD-10-CM | POA: Diagnosis not present

## 2018-02-16 DIAGNOSIS — Z952 Presence of prosthetic heart valve: Secondary | ICD-10-CM | POA: Insufficient documentation

## 2018-02-16 DIAGNOSIS — E851 Neuropathic heredofamilial amyloidosis: Secondary | ICD-10-CM | POA: Diagnosis not present

## 2018-02-16 DIAGNOSIS — E854 Organ-limited amyloidosis: Secondary | ICD-10-CM

## 2018-02-16 DIAGNOSIS — Z8249 Family history of ischemic heart disease and other diseases of the circulatory system: Secondary | ICD-10-CM | POA: Diagnosis not present

## 2018-02-16 MED ORDER — GABAPENTIN 300 MG PO CAPS: 300.0000 mg | ORAL_CAPSULE | Freq: Two times a day (BID) | ORAL | 11 refills | Status: DC

## 2018-02-16 MED ORDER — MIDODRINE HCL 10 MG PO TABS: 15.0000 mg | ORAL_TABLET | Freq: Three times a day (TID) | ORAL | 3 refills | Status: AC

## 2018-02-16 NOTE — Telephone Encounter (Signed)
Spoke with pt and reminded pt of remote transmission that is due today. Pt verbalized understanding.   

## 2018-02-16 NOTE — Patient Instructions (Signed)
Take Midodrine 15 mg (1.5 tabs) three times daily (once every 8 hours). Set alarm on smart phone to help remind you if you have difficulty taking all three doses each day on time.  START Gabapentin 200 mg once daily. Take a second dose in the afternoon if you feel it is needed.  Chest xray today to follow up on pleural effusion (fluid on lung). We will call you with these results within 24 hrs.  Follow up 6-8 weeks with Diane [name].  ______________________________________________________________ [name] Code: 1600  Take all medication as prescribed the day of your appointment. Bring all medications with you to your appointment.  Do the following things EVERYDAY: 1) Weigh yourself in the morning before breakfast. Write it down and keep it in a log. 2) Take your medicines as prescribed 3) Eat low salt foods-Limit salt (sodium) to 2000 mg per day.  4) Stay as active as you can everyday 5) Limit all fluids for the day to less than 2 liters

## 2018-02-16 NOTE — Progress Notes (Signed)
[name], female   DOB: February 25, 1961, 57 y.o.   MRN: [id number]   [facility] CLINIC NOTE   Date:  02/16/2018   ID:  Diane [name], DOB 1961-07-07, MRN [id number]  PCP:  Darrow Bussing, MD  Cardiologist:   Dr. Duke Salvia Electrophysiologist: Dr. Berton Mount   [name] is a 57 y.o. female who is referred to the HF Clinic by Dr. Duke Salvia for further evaluation of her chronic systolic and diastolic heart failure and possible infiltrative CM.  Symptoms started in 2016 with syncope and DOE. Referred to Dr. Duke Salvia in 12/16. Echocardiography revealed severe mitral regurgitation and a reduced LVEF (45-50%).  The MR was due to tethering of the posterior leaflet.  She was also noted to have grade 3 diastolic dysfunction with mid basal to mid inferolateral wall hypokinesis.  On 07/22/15 underwent repair of her mitral valve with a 26 mm Sorin Memo 3D annuloplasty ring on 07/22/15 by Dr. Cornelius Moras.  Intra-operatively she was noted to have a PDA with left to right shunting that was ligated in the OR. The left atrial appendage was also clipped.   On 08/05/15 she suffered a cardiac arrest at home and was resuscitated by EMS.  The initial rhythm was unclear but she had VT upon arrival in the ED, which was followed by atrial fibrillation.  She was treated with hypothermia and had prolonged hospital course. Fortunately she has no residual cognitive deficits.  During that hospitalization she underwent cardiac MRI with LVEF 45% with hypokinesis of the distal septum and apex.  There was diffuse, delayed enhancement in the subendocardium and mid epicardial region, most prominent in the inferior wall.  This was concerning for myocarditis or infiltrative cardiomyopathy.  Her EKG showed QT prolongation and she had an ICD placed for secondary prevention.  Her hospitalization was delayed by severe orthostatic hypotension and recurrent syncope requiring high doses of florinef and midodrine which were  weaned down.  She was discharged home but was readmitted with volume overload on 2/16.  Echo showed LVEF 35% with diffuse hypokinesis and her mitral valve was functioning well.    She underwent Pleurex catheter placement for recurrent left pleural effusion on 10/20/16.   We referred her to Dr. Erenest Rasher at [facility] and underwent Tilt Table testing 05/2016 that revealed autonomic failure.  Her BP dropped to 55/32.  She was started on mestanon but has been unable to tolerate it due to diarrhea.  She was also referred for a genetic evaluation given her long QT and SCD.  Repeat cMRI in 9/18 at [facility] showed EF 38% with diffuse myocardial as well as atrial enhancement concerning for infiltrative disease such as amyloid.   Tc-PYP scan 04/28/17 was strongly positive (qualitative = 3, H/CL > 1.5) c/w ATTR-CA. 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 Her family history is also notable for cardiomyopathy in her mother and maternal aunt at age ~35 yo and may suggest they had hereditary ATTR-CM as well.   Pt previously seen by Dr. Carilyn Goodpasture at [facility] and plan was for her to start patisiran here in [place].   She presents today for regular follow up. Had first patisiran infusion last week. Felt bad for a few days but now feeling better. Main issue is diffuse neuropathic pain and tingling. Feels like she is covered with concrete and harder to breathe.Mild ab bloating. Taking  torsemide daily. Weight stable. Pleurex catheter has been removed in 5/19. Off Northera taking 15mg  midodrine tid   PFT 11/05/14:  FEV1  1.9 L FVC : 2.4 L FEV1 and FVC are reduced, but the FEV1/FVC ratio is normal.  Following adminitration of bronchodilator there was no significant response.  Consistent with minimal obstructive airway disease.    LHC 07/08/15:  Severe mitral regurgitation  with large V waves and 4+ MR  There is mild to moderate left ventricular systolic dysfunction. With cardiac output/index moderate-severely reduced.  Angiographically normal coronary arteries   ICD interrogated personally: No VT/VF. Volume level down.  Personally reviewed   Past Medical History:  Diagnosis Date  . AICD (automatic cardioverter/defibrillator) present   . Anemia   . Anxiety   . Aortic aneurysm (HCC)   . Arthritis   . Cardiac arrest (HCC)   . Chronic combined systolic and diastolic CHF (congestive heart failure) (HCC) 07/03/2015   Grade 3 diastolic dysfunction.  LVEF 40-45% 05/2015.  Marland Kitchen Depression    SOME DEPRESSION (ON MEDS FOR 2 YRS) WHEN HER MOTHER PASSED  . Patent ductus arteriosus 07/16/2015  . 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 07/22/2015   26 mm Sorin Memo 3D ring annuloplasty with ligation of patent ductus arteriosus and clipping of LA appendage  . Severe mitral regurgitation 07/03/2015  . Shortness of breath dyspnea     Past Surgical History:  Procedure Laterality Date  . CARDIAC CATHETERIZATION N/A 07/08/2015   Procedure: Right/Left Heart Cath and Coronary Angiography;  Surgeon: Marykay Lex, MD;  Location: [facility] INVASIVE CV LAB;  Service: Cardiovascular;  Laterality: N/A;  . CHEST TUBE INSERTION Left 10/21/2015   Procedure: INSERTION left PLEURAL DRAINAGE CATHETER;  Surgeon: Purcell Nails, MD;  Location: [facility] OR;  Service: Thoracic;  Laterality: Left;  . CLIPPING OF ATRIAL APPENDAGE  07/22/2015   Procedure: CLIPPING OF LEFT ATRIAL APPENDAGE;  Surgeon: Purcell Nails, MD;  Location: [facility] OR;  Service: Open Heart Surgery;;  35 [address]riclip Pro  . COLONOSCOPY    . EP IMPLANTABLE DEVICE N/A 08/15/2015   Procedure: ICD Implant;  Surgeon: Will Jorja Loa, MD;  Location: [facility] INVASIVE CV LAB;  Service: Cardiovascular;  Laterality: N/A;  . MICROLARYNGOSCOPY WITH CO2 LASER AND EXCISION OF VOCAL CORD LESION N/A 11/04/2015   Procedure:  MICROLARYNGOSCOPY, RADIESSE VOCAL CORD AUGMENTATION;  Surgeon: Flo Shanks, MD;  Location: [facility] OR;  Service: ENT;  Laterality: N/A;  . MITRAL VALVE REPAIR N/A 07/22/2015   Procedure: MITRAL VALVE REPAIR;  Surgeon: Purcell Nails, MD;  Location: [facility] OR;  Service: Open Heart Surgery;  Laterality: N/A;  26 Sorin Memo 3D  . OVARIAN CYST REMOVAL    . PATENT DUCTUS ARTERIOUS REPAIR N/A 07/22/2015   Procedure: PATENT DUCTUS ARTERIOSUS (PDA) ligation;  Surgeon: Purcell Nails, MD;  Location: [facility] OR;  Service: Open Heart Surgery;  Laterality: N/A;  . REMOVAL OF PLEURAL DRAINAGE CATHETER Left 11/25/2017   Procedure: REMOVAL OF PLEURAL DRAINAGE CATHETER;  Surgeon: Purcell Nails, MD;  Location: [facility] OR;  Service: Thoracic;  Laterality: Left;  . TEE WITHOUT CARDIOVERSION N/A 07/07/2015   Procedure: TRANSESOPHAGEAL ECHOCARDIOGRAM (TEE);  Surgeon: Chilton Si, MD;  Location: [facility] ENDOSCOPY;  Service: Cardiovascular;  Laterality: N/A;  . TEE WITHOUT CARDIOVERSION N/A 07/22/2015   Procedure: TRANSESOPHAGEAL ECHOCARDIOGRAM (TEE);  Surgeon: Purcell Nails, MD;  Location: [facility] OR;  Service: Open Heart Surgery;  Laterality: N/A;     Current Outpatient Medications  Medication Sig  Dispense Refill  . aspirin EC 81 MG tablet Take 81 mg by mouth daily at 3 pm.    . ivabradine (CORLANOR) 5 MG TABS tablet Take 1 tablet (5 mg total) by mouth 2 (two) times daily with a meal. (Patient taking differently: Take 5 mg by mouth 2 (two) times daily with a meal. 0700 &1900) 180 tablet 3  . midodrine (PROAMATINE) 10 MG tablet Take 1.5 tablets (15 mg total) by mouth 3 (three) times daily with meals. 405 tablet 3  . midodrine (PROAMATINE) 5 MG tablet TAKE 2 TABLETS BY MOUTH 3 TIMES A DAY 540 tablet 1  . Polyethyl Glycol-Propyl Glycol (LUBRICANT EYE DROPS) 0.4-0.3 % SOLN Place 1 drop into both eyes 2 (two) times daily.    . potassium chloride (K-DUR) 10 MEQ tablet Take 1 tablet (10 mEq total) by mouth daily. (Patient taking differently:  Take 10 mEq by mouth daily at 6 (six) AM. ) 30 tablet 6  . torsemide (DEMADEX) 20 MG tablet Take 1 tablet (20 mg total) by mouth daily. (Patient taking differently: Take 20 mg by mouth daily at 6 (six) AM. ) 30 tablet 6  . vitamin A 16109 UNIT capsule Take 1 capsule (10,000 Units total) by mouth daily.     No current facility-administered medications for this encounter.     Allergies:   Isopropyl alcohol; Vicodin [hydrocodone-acetaminophen]; Adhesive [tape]; Cymbalta [duloxetine hcl]; Oxycodone; and Silicone    Social History:  The patient  reports that she has never smoked. She has never used smokeless tobacco. She reports that she does not drink alcohol or use drugs.   Family History:  The patient's family history includes Diabetes in her father; Heart disease in her mother; Hypertension in her mother; Stomach cancer in her father.   Review of systems complete and found to be negative unless listed in HPI.    PHYSICAL EXAM:  Vitals:   02/16/18 1047  BP: (!) 117/48  Pulse: 71  SpO2: 100%  Weight: 96 lb (43.5 kg)    Wt Readings from Last 3 Encounters:  02/16/18 96 lb (43.5 kg)  02/09/18 95 lb (43.1 kg)  12/13/17 100 lb 9.6 oz (45.6 kg)   General:  Thin. Sitting in WC. No resp difficulty HEENT: normal Neck: supple. JVP 6-7  Carotids 2+ bilat; no bruits. No lymphadenopathy or thryomegaly appreciated. Cor: PMI nondisplaced. Regular rate & rhythm. No rubs, gallops or murmurs. Lungs: clear slightly decreased left base Abdomen: soft, nontender, mildly distended. No hepatosplenomegaly. No bruits or masses. Good bowel sounds. Extremities: no cyanosis, clubbing, rash, edema Neuro: alert & orientedx3, cranial nerves grossly intact. moves all 4 extremities w/o difficulty. 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 S/p ICD for SCD. Also with diffuse delaying enhancement concerning for infiltrative disease.  - PYP scan markedly positive for TTR-amyloid.  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 - Stable NYHA IIIB in setting of TTR amyloid with extensive cardiac involvement. - She has been seen at the [facility] at [facility] (appreciate their input  greatly) and the decision to pursue patisiran therapy in [place] has been made.  - Started patisiran 7/19. Has tolerated first dose reasonably well but now having worsening neuropathy. Unable to tolerate Lyrica in past. Will start neurontin - Continue torsemide 20 mg as needed - BP has historically been too soft for BB or ACE/ARB/ARNI.  - Continue ivabradine 5 mg BID. Unable to tolerate higher dose.  - Continue midodrine at 15mg  bid. Lorin Picket has been stopped   2) Recurrent left pleural effusion - Pleurex catheter out in May 2019. Minimal left effusion by CXR today (Personally reviewed) - Followed by Dr. Cornelius Moras  3) Mitral regurgitation s/p mitral valve repair:   - Stable s/p MV repair.   4) Paroxysmal atrial fibrillation:  - Remains in NSR. Denies bleeding on coumadin. LA appendage was clipped in surgery. - The patients CHA2DS2-VASc Score and unadjusted Ischemic Stroke Rate (% per year) is equal to 2.2 % stroke rate/year from a score of 2 .   5) Orthostatic hypotension:  - She has been evaluated by Dr. Read Drivers at [facility] with markedly + tilt table. - Continue midodrine. She could not tolerate mestinon. 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   Total time spent 45 minutes. Over half that time spent discussing  above.   Arvilla Meres, MD  11:08 AM

## 2018-02-17 NOTE — Progress Notes (Signed)
Remote ICD transmission.   

## 2018-03-02 ENCOUNTER — Encounter (HOSPITAL_COMMUNITY)
Admission: RE | Admit: 2018-03-02 | Discharge: 2018-03-02 | Disposition: A | Discharge status: Home / Self Care | Payer: Medicare HMO | Source: Ambulatory Visit | Attending: Internal Medicine | Admitting: Internal Medicine

## 2018-03-02 DIAGNOSIS — E854 Organ-limited amyloidosis: Secondary | ICD-10-CM | POA: Insufficient documentation

## 2018-03-02 DIAGNOSIS — I43 Cardiomyopathy in diseases classified elsewhere: Secondary | ICD-10-CM

## 2018-03-02 MED ORDER — DIPHENHYDRAMINE HCL 50 MG/ML IJ SOLN
INTRAMUSCULAR | Status: AC
  Administered 2018-03-02: 25 mg via INTRAVENOUS
  Filled 2018-03-02: qty 1

## 2018-03-02 MED ORDER — ACETAMINOPHEN 325 MG PO TABS
ORAL_TABLET | ORAL | Status: AC
  Administered 2018-03-02: 650 mg via ORAL
  Filled 2018-03-02: qty 2

## 2018-03-02 MED ORDER — SODIUM CHLORIDE 0.9 % IV SOLN
0.3000 mg/kg | INTRAVENOUS | Status: DC
  Administered 2018-03-02: 12 mg via INTRAVENOUS
  Filled 2018-03-02: qty 6

## 2018-03-02 MED ORDER — METHYLPREDNISOLONE SODIUM SUCC 40 MG IJ SOLR
INTRAMUSCULAR | Status: AC
  Administered 2018-03-02: 80 mg
  Filled 2018-03-02: qty 2

## 2018-03-02 MED ORDER — ACETAMINOPHEN 325 MG PO TABS
650.0000 mg | ORAL_TABLET | Freq: Once | ORAL | Status: AC
  Administered 2018-03-02: 650 mg via ORAL

## 2018-03-02 MED ORDER — LORATADINE 10 MG PO TABS
ORAL_TABLET | ORAL | Status: AC
  Administered 2018-03-02: 10 mg via ORAL
  Filled 2018-03-02: qty 1

## 2018-03-02 MED ORDER — METHYLPREDNISOLONE SODIUM SUCC 125 MG IJ SOLR: 80.0000 mg | Freq: Once | INTRAMUSCULAR | Status: DC

## 2018-03-02 MED ORDER — LORATADINE 10 MG PO TABS
10.0000 mg | ORAL_TABLET | Freq: Once | ORAL | Status: AC
  Administered 2018-03-02: 10 mg via ORAL

## 2018-03-02 MED ORDER — FAMOTIDINE IN NACL 20-0.9 MG/50ML-% IV SOLN
20.0000 mg | Freq: Once | INTRAVENOUS | Status: AC
  Administered 2018-03-02: 20 mg via INTRAVENOUS
  Filled 2018-03-02: qty 50

## 2018-03-02 MED ORDER — DIPHENHYDRAMINE HCL 50 MG/ML IJ SOLN
25.0000 mg | Freq: Once | INTRAMUSCULAR | Status: AC
  Administered 2018-03-02: 25 mg via INTRAVENOUS

## 2018-03-13 ENCOUNTER — Other Ambulatory Visit (HOSPITAL_COMMUNITY): Payer: Self-pay | Admitting: Internal Medicine

## 2018-03-16 LAB — CUP PACEART REMOTE DEVICE CHECK
Battery Voltage: 3 V
Brady Statistic AP VP Percent: 0.01 %
Brady Statistic AP VS Percent: 4.05 %
Brady Statistic AS VS Percent: 95.91 %
Brady Statistic RA Percent Paced: 4.04 %
Brady Statistic RV Percent Paced: 0.03 %
HighPow Impedance: 40 Ohm
Implantable Lead Implant Date: 20170127
Implantable Lead Location: 753859
Lead Channel Impedance Value: 247 Ohm
Lead Channel Impedance Value: 399 Ohm
Lead Channel Pacing Threshold Amplitude: 0.75 V
Lead Channel Pacing Threshold Amplitude: 0.875 V
Lead Channel Pacing Threshold Pulse Width: 0.4 ms
Lead Channel Pacing Threshold Pulse Width: 0.4 ms
Lead Channel Sensing Intrinsic Amplitude: 2.375 mV
Lead Channel Sensing Intrinsic Amplitude: 6.375 mV
Lead Channel Setting Pacing Amplitude: 2 V
Lead Channel Setting Sensing Sensitivity: 0.3 mV
MDC IDC LEAD IMPLANT DT: 20170127
MDC IDC LEAD LOCATION: 753860
MDC IDC MSMT BATTERY REMAINING LONGEVITY: 111 mo
MDC IDC MSMT LEADCHNL RA SENSING INTR AMPL: 2.375 mV
MDC IDC MSMT LEADCHNL RV IMPEDANCE VALUE: 304 Ohm
MDC IDC MSMT LEADCHNL RV SENSING INTR AMPL: 6.375 mV
MDC IDC PG IMPLANT DT: 20170127
MDC IDC SESS DTM: 20190802152242
MDC IDC SET LEADCHNL RA PACING AMPLITUDE: 1.5 V
MDC IDC SET LEADCHNL RV PACING PULSEWIDTH: 0.4 ms
MDC IDC STAT BRADY AS VP PERCENT: 0.03 %

## 2018-03-23 ENCOUNTER — Ambulatory Visit (HOSPITAL_COMMUNITY)
Admission: RE | Admit: 2018-03-23 | Discharge: 2018-03-23 | Disposition: A | Discharge status: Home / Self Care | Payer: Medicare HMO | Source: Ambulatory Visit | Attending: Internal Medicine | Admitting: Internal Medicine

## 2018-03-23 DIAGNOSIS — E854 Organ-limited amyloidosis: Secondary | ICD-10-CM | POA: Diagnosis not present

## 2018-03-23 DIAGNOSIS — I43 Cardiomyopathy in diseases classified elsewhere: Secondary | ICD-10-CM | POA: Insufficient documentation

## 2018-03-23 MED ORDER — FAMOTIDINE IN NACL 20-0.9 MG/50ML-% IV SOLN
20.0000 mg | Freq: Once | INTRAVENOUS | Status: AC
  Administered 2018-03-23: 20 mg via INTRAVENOUS
  Filled 2018-03-23: qty 50

## 2018-03-23 MED ORDER — DIPHENHYDRAMINE HCL 50 MG/ML IJ SOLN
25.0000 mg | Freq: Once | INTRAMUSCULAR | Status: AC
  Administered 2018-03-23: 25 mg via INTRAVENOUS

## 2018-03-23 MED ORDER — ACETAMINOPHEN 325 MG PO TABS
ORAL_TABLET | ORAL | Status: AC
Start: 2018-03-23 — End: 2018-03-23
  Administered 2018-03-23: 650 mg via ORAL
  Filled 2018-03-23: qty 2

## 2018-03-23 MED ORDER — LORATADINE 10 MG PO TABS
ORAL_TABLET | ORAL | Status: AC
Start: 2018-03-23 — End: 2018-03-23
  Administered 2018-03-23: 10 mg via ORAL
  Filled 2018-03-23: qty 1

## 2018-03-23 MED ORDER — METHYLPREDNISOLONE SODIUM SUCC 125 MG IJ SOLR
INTRAMUSCULAR | Status: AC
  Administered 2018-03-23: 80 mg via INTRAVENOUS
  Filled 2018-03-23: qty 2

## 2018-03-23 MED ORDER — PATISIRAN SODIUM 10 MG/5ML IV SOLN
0.3000 mg/kg | Status: DC
  Administered 2018-03-23: 12 mg via INTRAVENOUS
  Filled 2018-03-23: qty 6

## 2018-03-23 MED ORDER — ACETAMINOPHEN 325 MG PO TABS
650.0000 mg | ORAL_TABLET | Freq: Once | ORAL | Status: AC
  Administered 2018-03-23: 650 mg via ORAL

## 2018-03-23 MED ORDER — METHYLPREDNISOLONE SODIUM SUCC 125 MG IJ SOLR
80.0000 mg | INTRAMUSCULAR | Status: DC
  Administered 2018-03-23: 80 mg via INTRAVENOUS

## 2018-03-23 MED ORDER — LORATADINE 10 MG PO TABS
10.0000 mg | ORAL_TABLET | Freq: Once | ORAL | Status: AC
  Administered 2018-03-23: 10 mg via ORAL

## 2018-03-23 MED ORDER — DIPHENHYDRAMINE HCL 50 MG/ML IJ SOLN
INTRAMUSCULAR | Status: AC
Start: 2018-03-23 — End: 2018-03-23
  Administered 2018-03-23: 25 mg via INTRAVENOUS
  Filled 2018-03-23: qty 1

## 2018-04-12 ENCOUNTER — Other Ambulatory Visit: Payer: Self-pay

## 2018-04-12 ENCOUNTER — Other Ambulatory Visit (HOSPITAL_COMMUNITY): Payer: Self-pay | Admitting: *Deleted

## 2018-04-12 ENCOUNTER — Ambulatory Visit (HOSPITAL_COMMUNITY)
Admission: RE | Admit: 2018-04-12 | Discharge: 2018-04-12 | Disposition: A | Discharge status: Home / Self Care | Payer: Medicare HMO | Source: Ambulatory Visit | Attending: Internal Medicine | Admitting: Internal Medicine

## 2018-04-12 ENCOUNTER — Encounter (HOSPITAL_COMMUNITY): Payer: Self-pay | Admitting: Internal Medicine

## 2018-04-12 VITALS — BP 97/54 | HR 75 | Wt 95.2 lb

## 2018-04-12 DIAGNOSIS — I43 Cardiomyopathy in diseases classified elsewhere: Secondary | ICD-10-CM

## 2018-04-12 DIAGNOSIS — J90 Pleural effusion, not elsewhere classified: Secondary | ICD-10-CM | POA: Diagnosis not present

## 2018-04-12 DIAGNOSIS — Z79899 Other long term (current) drug therapy: Secondary | ICD-10-CM | POA: Diagnosis not present

## 2018-04-12 DIAGNOSIS — I480 Paroxysmal atrial fibrillation: Secondary | ICD-10-CM

## 2018-04-12 DIAGNOSIS — Z8674 Personal history of sudden cardiac arrest: Secondary | ICD-10-CM | POA: Diagnosis not present

## 2018-04-12 DIAGNOSIS — I951 Orthostatic hypotension: Secondary | ICD-10-CM | POA: Diagnosis not present

## 2018-04-12 DIAGNOSIS — Z952 Presence of prosthetic heart valve: Secondary | ICD-10-CM | POA: Insufficient documentation

## 2018-04-12 DIAGNOSIS — F419 Anxiety disorder, unspecified: Secondary | ICD-10-CM | POA: Insufficient documentation

## 2018-04-12 DIAGNOSIS — F329 Major depressive disorder, single episode, unspecified: Secondary | ICD-10-CM | POA: Insufficient documentation

## 2018-04-12 DIAGNOSIS — E854 Organ-limited amyloidosis: Secondary | ICD-10-CM

## 2018-04-12 DIAGNOSIS — I5022 Chronic systolic (congestive) heart failure: Secondary | ICD-10-CM

## 2018-04-12 DIAGNOSIS — I5042 Chronic combined systolic (congestive) and diastolic (congestive) heart failure: Secondary | ICD-10-CM | POA: Insufficient documentation

## 2018-04-12 DIAGNOSIS — Z888 Allergy status to other drugs, medicaments and biological substances status: Secondary | ICD-10-CM | POA: Insufficient documentation

## 2018-04-12 DIAGNOSIS — I340 Nonrheumatic mitral (valve) insufficiency: Secondary | ICD-10-CM | POA: Diagnosis not present

## 2018-04-12 DIAGNOSIS — R69 Illness, unspecified: Secondary | ICD-10-CM | POA: Diagnosis not present

## 2018-04-12 DIAGNOSIS — G629 Polyneuropathy, unspecified: Secondary | ICD-10-CM | POA: Diagnosis not present

## 2018-04-12 DIAGNOSIS — Z885 Allergy status to narcotic agent status: Secondary | ICD-10-CM | POA: Diagnosis not present

## 2018-04-12 DIAGNOSIS — Z95810 Presence of automatic (implantable) cardiac defibrillator: Secondary | ICD-10-CM | POA: Insufficient documentation

## 2018-04-12 DIAGNOSIS — Z7982 Long term (current) use of aspirin: Secondary | ICD-10-CM | POA: Insufficient documentation

## 2018-04-12 DIAGNOSIS — Z8249 Family history of ischemic heart disease and other diseases of the circulatory system: Secondary | ICD-10-CM | POA: Insufficient documentation

## 2018-04-12 NOTE — Progress Notes (Signed)
[name], female   DOB: 1960/08/15, 57 y.o.   MRN: [id number]   ADVANCED HF CLINIC NOTE   Date:  04/12/2018   ID:  Diane [name], DOB 28-Oct-1960, MRN [id number]  PCP:  Darrow Bussing, MD  Cardiologist:   Dr. Duke Salvia Electrophysiologist: Dr. Berton Mount   [name] is a 57 y.o. female who is referred to the HF Clinic by Dr. Duke Salvia for further evaluation of her chronic systolic and diastolic heart failure and possible infiltrative CM.  Symptoms started in 2016 with syncope and DOE. Referred to Dr. Duke Salvia in 12/16. Echocardiography revealed severe mitral regurgitation and a reduced LVEF (45-50%).  The MR was due to tethering of the posterior leaflet.  She was also noted to have grade 3 diastolic dysfunction with mid basal to mid inferolateral wall hypokinesis.  On 07/22/15 underwent repair of her mitral valve with a 26 mm Sorin Memo 3D annuloplasty ring on 07/22/15 by Dr. Cornelius Moras.  Intra-operatively she was noted to have a PDA with left to right shunting that was ligated in the OR. The left atrial appendage was also clipped.   On 08/05/15 she suffered a cardiac arrest at home and was resuscitated by EMS.  The initial rhythm was unclear but she had VT upon arrival in the ED, which was followed by atrial fibrillation.  She was treated with hypothermia and had prolonged hospital course. Fortunately she has no residual cognitive deficits.  During that hospitalization she underwent cardiac MRI with LVEF 45% with hypokinesis of the distal septum and apex.  There was diffuse, delayed enhancement in the subendocardium and mid epicardial region, most prominent in the inferior wall.  This was concerning for myocarditis or infiltrative cardiomyopathy.  Her EKG showed QT prolongation and she had an ICD placed for secondary prevention.  Her hospitalization was delayed by severe orthostatic hypotension and recurrent syncope requiring high doses of florinef and midodrine which were  weaned down.  She was discharged home but was readmitted with volume overload on 2/16.  Echo showed LVEF 35% with diffuse hypokinesis and her mitral valve was functioning well.    She underwent Pleurex catheter placement for recurrent left pleural effusion on 10/20/16. Pleurex removed 5/19.   We referred her to Dr. Erenest Rasher at [facility] and underwent Tilt Table testing 05/2016 that revealed autonomic failure.  Her BP dropped to 55/32.  She was started on mestanon but has been unable to tolerate it due to diarrhea.  She was also referred for a genetic evaluation given her long QT and SCD.  Repeat cMRI in 9/18 at [facility] showed EF 38% with diffuse myocardial as well as atrial enhancement concerning for infiltrative disease such as amyloid.   Tc-PYP scan 04/28/17 was strongly positive (qualitative = 3, H/CL > 1.5) c/w ATTR-CA. 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 Her family history is also notable for cardiomyopathy in her mother and maternal aunt at age ~7 yo and may suggest they had hereditary ATTR-CM as well.   Pt previously seen by Dr. Carilyn Goodpasture at [facility] and plan was for her to start patisiran here in [place].   She presents today for regular follow up. Remains on patisiran. Has had 2 infusions and tomorrow will be the 3rd. Starting to feel better. Able to do ADLs. However neuropathy in hands is getting worse. Still with bad tingling in legs.  Does feel stronger though. Orthostasis improved. Taking torsemide 20mg  daily. Mile LE edema. No orthopnea or PND. Continues on 15mg  midodrine tid    PFT 11/05/14:  FEV1  1.9 L FVC : 2.4 L FEV1 and FVC are reduced, but the FEV1/FVC ratio is normal.  Following adminitration of bronchodilator there was no significant response.  Consistent with minimal obstructive airway disease.    LHC 07/08/15:  Severe  mitral regurgitation with large V waves and 4+ MR  There is mild to moderate left ventricular systolic dysfunction. With cardiac output/index moderate-severely reduced.  Angiographically normal coronary arteries   ICD interrogated personally: No VT/VT. Activity ~1hr per day. Volume slightly elevated but not across threshold. Personally reviewed    Past Medical History:  Diagnosis Date  . AICD (automatic cardioverter/defibrillator) present   . Anemia   . Anxiety   . Aortic aneurysm (HCC)   . Arthritis   . Cardiac arrest (HCC)   . Chronic combined systolic and diastolic CHF (congestive heart failure) (HCC) 07/03/2015   Grade 3 diastolic dysfunction.  LVEF 40-45% 05/2015.  Marland Kitchen Depression    SOME DEPRESSION (ON MEDS FOR 2 YRS) WHEN HER MOTHER PASSED  . Patent ductus arteriosus 07/16/2015  . 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 07/22/2015   26 mm Sorin Memo 3D ring annuloplasty with ligation of patent ductus arteriosus and clipping of LA appendage  . Severe mitral regurgitation 07/03/2015  . Shortness of breath dyspnea     Past Surgical History:  Procedure Laterality Date  . CARDIAC CATHETERIZATION N/A 07/08/2015   Procedure: Right/Left Heart Cath and Coronary Angiography;  Surgeon: Marykay Lex, MD;  Location: [facility] INVASIVE CV LAB;  Service: Cardiovascular;  Laterality: N/A;  . CHEST TUBE INSERTION Left 10/21/2015   Procedure: INSERTION left PLEURAL DRAINAGE CATHETER;  Surgeon: Purcell Nails, MD;  Location: [facility] OR;  Service: Thoracic;  Laterality: Left;  . CLIPPING OF ATRIAL APPENDAGE  07/22/2015   Procedure: CLIPPING OF LEFT ATRIAL APPENDAGE;  Surgeon: Purcell Nails, MD;  Location: [facility] OR;  Service: Open Heart Surgery;;  35 [address] Pro  . COLONOSCOPY    . EP IMPLANTABLE DEVICE N/A 08/15/2015   Procedure: ICD Implant;  Surgeon: Will Jorja Loa, MD;  Location: [facility] INVASIVE CV LAB;  Service: Cardiovascular;  Laterality: N/A;  . MICROLARYNGOSCOPY WITH CO2  LASER AND EXCISION OF VOCAL CORD LESION N/A 11/04/2015   Procedure: MICROLARYNGOSCOPY, RADIESSE VOCAL CORD AUGMENTATION;  Surgeon: Flo Shanks, MD;  Location: [facility] OR;  Service: ENT;  Laterality: N/A;  . MITRAL VALVE REPAIR N/A 07/22/2015   Procedure: MITRAL VALVE REPAIR;  Surgeon: Purcell Nails, MD;  Location: [facility] OR;  Service: Open Heart Surgery;  Laterality: N/A;  26 Sorin Memo 3D  . OVARIAN CYST REMOVAL    . PATENT DUCTUS ARTERIOUS REPAIR N/A 07/22/2015   Procedure: PATENT DUCTUS ARTERIOSUS (PDA) ligation;  Surgeon: Purcell Nails, MD;  Location: [facility] OR;  Service: Open Heart Surgery;  Laterality: N/A;  . REMOVAL OF PLEURAL DRAINAGE CATHETER Left 11/25/2017   Procedure: REMOVAL OF PLEURAL DRAINAGE CATHETER;  Surgeon: Purcell Nails, MD;  Location: [facility] OR;  Service: Thoracic;  Laterality: Left;  . TEE WITHOUT CARDIOVERSION N/A 07/07/2015   Procedure: TRANSESOPHAGEAL ECHOCARDIOGRAM (TEE);  Surgeon: Chilton Si, MD;  Location: [facility] ENDOSCOPY;  Service: Cardiovascular;  Laterality: N/A;  . TEE WITHOUT CARDIOVERSION N/A 07/22/2015   Procedure: TRANSESOPHAGEAL ECHOCARDIOGRAM (TEE);  Surgeon: Purcell Nails, MD;  Location: [facility] OR;  Service: Open Heart  Surgery;  Laterality: N/A;     Current Outpatient Medications  Medication Sig Dispense Refill  . aspirin EC 81 MG tablet Take 81 mg by mouth daily at 3 pm.    . gabapentin (NEURONTIN) 300 MG capsule Take 1 capsule (300 mg total) by mouth 2 (two) times daily. Only take afternoon dose if needed. 60 capsule 11  . ivabradine (CORLANOR) 5 MG TABS tablet Take 1 tablet (5 mg total) by mouth 2 (two) times daily with a meal. (Patient taking differently: Take 5 mg by mouth 2 (two) times daily with a meal. 0700 &1900) 180 tablet 3  . midodrine (PROAMATINE) 10 MG tablet Take 1.5 tablets (15 mg total) by mouth 3 (three) times daily with meals. 405 tablet 3  . Polyethyl Glycol-Propyl Glycol (LUBRICANT EYE DROPS) 0.4-0.3 % SOLN Place 1 drop into both eyes 2 (two) times  daily.    . potassium chloride (K-DUR) 10 MEQ tablet Take 1 tablet (10 mEq total) by mouth daily. (Patient taking differently: Take 10 mEq by mouth daily at 6 (six) AM. ) 30 tablet 6  . torsemide (DEMADEX) 20 MG tablet Take 1 tablet (20 mg total) by mouth daily. (Patient taking differently: Take 20 mg by mouth daily at 6 (six) AM. ) 30 tablet 6  . vitamin A 51700 UNIT capsule Take 1 capsule (10,000 Units total) by mouth daily.     No current facility-administered medications for this encounter.     Allergies:   Isopropyl alcohol; Vicodin [hydrocodone-acetaminophen]; Adhesive [tape]; Cymbalta [duloxetine hcl]; Oxycodone; and Silicone    Social History:  The patient  reports that she has never smoked. She has never used smokeless tobacco. She reports that she does not drink alcohol or use drugs.   Family History:  The patient's family history includes Diabetes in her father; Heart disease in her mother; Hypertension in her mother; Stomach cancer in her father.   Review of systems complete and found to be negative unless listed in HPI.    PHYSICAL EXAM:  Vitals:   04/12/18 0944  BP: (!) 97/54  Pulse: 75  SpO2: 99%  Weight: 43.2 kg (95 lb 4 oz)    Wt Readings from Last 3 Encounters:  04/12/18 43.2 kg (95 lb 4 oz)  03/23/18 43.1 kg (95 lb)  03/02/18 41.7 kg (92 lb)   General:  Thin. Sitting in WC.Marland Kitchen No resp difficulty HEENT: normal Neck: supple.JVP 9  Carotids 2+ bilat; no bruits. No lymphadenopathy or thryomegaly appreciated. Cor: PMI nondisplaced. Regular rate & rhythm. No rubs, gallops or murmurs. Lungs: clear Abdomen: soft, nontender, mildly distended. No hepatosplenomegaly. No bruits or masses. Good bowel sounds. Extremities: no cyanosis, clubbing, rash, 1-2+ ankle edema Neuro: alert & orientedx3, cranial nerves grossly intact. moves all 4 extremities w/o difficulty. 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 S/p ICD for SCD. Also with diffuse delaying enhancement concerning for infiltrative disease.  - PYP scan markedly positive for TTR-amyloid.  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 - Stable NYHA IIIB  in setting of TTR amyloid with extensive cardiac involvement. - She has been seen at the Amyloid [facility] at [facility] (appreciate their input greatly) and the decision to pursue patisiran therapy in [place] has been made.  - Started patisiran 7/19. Has tolerated first two doses reasonably well and now seems to be getting a bit stronger. 3rd infusion scheduled for tomorrow. Hopefully will have continued improvement.  - Volume status mildly elevated on Optivol and exam. Take torsemide 40 daily and kcl x 2 days. Continue torsemide 20 mg as needed - BP has historically been too soft for BB or ACE/ARB/ARNI.  - Continue ivabradine 5 mg BID. Unable to tolerate higher dose.  - Continue midodrine at 15mg  bid. Lorin Picket has been stopped  - ICD interrogated personally as above.   2) Recurrent left pleural effusion - Pleurex catheter out in May 2019. Minimal left effusion by CXR 02/16/18 - Followed by Dr. Cornelius Moras - Lungs clear on exam today  3) Mitral regurgitation s/p mitral valve repair:   - Stable s/p MV repair. Aware of SBE prophylaxis  4) Paroxysmal atrial fibrillation:  - Remains in NSR.  No bleeding on coumadin  LA appendage was clipped in surgery. - The patients CHA2DS2-VASc Score and unadjusted Ischemic Stroke Rate (% per year) is equal to 2.2 % stroke rate/year from a score of 2 .   5)  Orthostatic hypotension:  - She has been evaluated by Dr. Read Drivers at [facility] with markedly + tilt table. - Continue midodrine. She could not tolerate mestinon. 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 Improving with patisiran  Arvilla Meres, MD  9:38 AM

## 2018-04-12 NOTE — Patient Instructions (Signed)
Increase Torsemide to 40 mg (2 tabs) FOR 2 DAYS ONLY  Take an extra Potassium tab for 2 DAYS ONLY  Your physician recommends that you schedule a follow-up appointment in: 2-3 months

## 2018-04-12 NOTE — Progress Notes (Signed)
Pt was approved for a grant in the amount of $8000 from 11/06/17 to 11/06/18 through the Amyloidosis fund to help with the cost of her Onpattro.

## 2018-04-13 ENCOUNTER — Ambulatory Visit (HOSPITAL_COMMUNITY)
Admission: RE | Admit: 2018-04-13 | Discharge: 2018-04-13 | Disposition: A | Discharge status: Home / Self Care | Payer: Medicare HMO | Source: Ambulatory Visit | Attending: Internal Medicine | Admitting: Internal Medicine

## 2018-04-13 DIAGNOSIS — I43 Cardiomyopathy in diseases classified elsewhere: Secondary | ICD-10-CM | POA: Diagnosis present

## 2018-04-13 DIAGNOSIS — E854 Organ-limited amyloidosis: Secondary | ICD-10-CM | POA: Insufficient documentation

## 2018-04-13 MED ORDER — LORATADINE 10 MG PO TABS
ORAL_TABLET | ORAL | Status: AC
Start: 2018-04-13 — End: 2018-04-13
  Administered 2018-04-13: 10 mg via ORAL
  Filled 2018-04-13: qty 1

## 2018-04-13 MED ORDER — LORATADINE 10 MG PO TABS
10.0000 mg | ORAL_TABLET | Freq: Once | ORAL | Status: AC
  Administered 2018-04-13: 10 mg via ORAL

## 2018-04-13 MED ORDER — METHYLPREDNISOLONE SODIUM SUCC 125 MG IJ SOLR
INTRAMUSCULAR | Status: AC
  Administered 2018-04-13: 80 mg via INTRAVENOUS
  Filled 2018-04-13: qty 2

## 2018-04-13 MED ORDER — FAMOTIDINE IN NACL 20-0.9 MG/50ML-% IV SOLN
20.0000 mg | Freq: Once | INTRAVENOUS | Status: AC
  Administered 2018-04-13: 20 mg via INTRAVENOUS
  Filled 2018-04-13: qty 50

## 2018-04-13 MED ORDER — DIPHENHYDRAMINE HCL 50 MG/ML IJ SOLN
INTRAMUSCULAR | Status: AC
  Filled 2018-04-13: qty 1

## 2018-04-13 MED ORDER — DIPHENHYDRAMINE HCL 50 MG/ML IJ SOLN: 25.0000 mg | Freq: Once | INTRAMUSCULAR | Status: DC

## 2018-04-13 MED ORDER — SODIUM CHLORIDE 0.9 % IV SOLN
0.3000 mg/kg | Status: DC
  Administered 2018-04-13: 12 mg via INTRAVENOUS
  Filled 2018-04-13 (×2): qty 6

## 2018-04-13 MED ORDER — ACETAMINOPHEN 325 MG PO TABS
650.0000 mg | ORAL_TABLET | Freq: Once | ORAL | Status: AC
  Administered 2018-04-13: 650 mg via ORAL

## 2018-04-13 MED ORDER — ACETAMINOPHEN 325 MG PO TABS
ORAL_TABLET | ORAL | Status: AC
  Administered 2018-04-13: 650 mg via ORAL
  Filled 2018-04-13: qty 2

## 2018-04-13 MED ORDER — METHYLPREDNISOLONE SODIUM SUCC 125 MG IJ SOLR
80.0000 mg | INTRAMUSCULAR | Status: DC
  Administered 2018-04-13: 80 mg via INTRAVENOUS

## 2018-04-13 NOTE — Progress Notes (Signed)
Patient asked if we could hold IV benadryl since it makes her feel jittery.  Called down to HF clinic and spoke to [place], nurse with Dr. Jones Broom and she stated we could hold it but if patient began to have symptoms then give the benadryl.

## 2018-05-04 ENCOUNTER — Ambulatory Visit (HOSPITAL_COMMUNITY)
Admission: RE | Admit: 2018-05-04 | Discharge: 2018-05-04 | Disposition: A | Discharge status: Home / Self Care | Payer: Medicare HMO | Source: Ambulatory Visit | Attending: Internal Medicine | Admitting: Internal Medicine

## 2018-05-04 DIAGNOSIS — E854 Organ-limited amyloidosis: Secondary | ICD-10-CM | POA: Insufficient documentation

## 2018-05-04 DIAGNOSIS — I43 Cardiomyopathy in diseases classified elsewhere: Secondary | ICD-10-CM | POA: Diagnosis not present

## 2018-05-04 MED ORDER — METHYLPREDNISOLONE SODIUM SUCC 125 MG IJ SOLR: 80.0000 mg | INTRAMUSCULAR | Status: DC

## 2018-05-04 MED ORDER — ACETAMINOPHEN 325 MG PO TABS
ORAL_TABLET | ORAL | Status: AC
  Administered 2018-05-04: 650 mg via ORAL
  Filled 2018-05-04: qty 2

## 2018-05-04 MED ORDER — FAMOTIDINE IN NACL 20-0.9 MG/50ML-% IV SOLN
INTRAVENOUS | Status: AC
  Filled 2018-05-04: qty 50

## 2018-05-04 MED ORDER — SODIUM CHLORIDE 0.9 % IV SOLN
0.3000 mg/kg | Status: DC
  Administered 2018-05-04: 12 mg via INTRAVENOUS
  Filled 2018-05-04: qty 6

## 2018-05-04 MED ORDER — LORATADINE 10 MG PO TABS
ORAL_TABLET | ORAL | Status: AC
  Administered 2018-05-04: 10 mg via ORAL
  Filled 2018-05-04: qty 1

## 2018-05-04 MED ORDER — FAMOTIDINE IN NACL 20-0.9 MG/50ML-% IV SOLN
20.0000 mg | Freq: Once | INTRAVENOUS | Status: AC
  Administered 2018-05-04: 20 mg via INTRAVENOUS

## 2018-05-04 MED ORDER — LORATADINE 10 MG PO TABS
10.0000 mg | ORAL_TABLET | Freq: Once | ORAL | Status: AC
  Administered 2018-05-04: 10 mg via ORAL

## 2018-05-04 MED ORDER — METHYLPREDNISOLONE SODIUM SUCC 40 MG IJ SOLR
INTRAMUSCULAR | Status: AC
  Administered 2018-05-04: 80 mg
  Filled 2018-05-04: qty 2

## 2018-05-04 MED ORDER — ACETAMINOPHEN 325 MG PO TABS
650.0000 mg | ORAL_TABLET | Freq: Once | ORAL | Status: AC
  Administered 2018-05-04: 650 mg via ORAL

## 2018-05-06 ENCOUNTER — Other Ambulatory Visit (HOSPITAL_COMMUNITY): Payer: Self-pay | Admitting: Internal Medicine

## 2018-05-18 ENCOUNTER — Telehealth: Payer: Self-pay

## 2018-05-18 ENCOUNTER — Ambulatory Visit (INDEPENDENT_AMBULATORY_CARE_PROVIDER_SITE_OTHER): Payer: Medicare HMO | Admitting: *Deleted

## 2018-05-18 DIAGNOSIS — I472 Ventricular tachycardia, unspecified: Secondary | ICD-10-CM

## 2018-05-18 DIAGNOSIS — I5022 Chronic systolic (congestive) heart failure: Secondary | ICD-10-CM

## 2018-05-18 NOTE — Telephone Encounter (Signed)
LMOVM reminding pt to send remote transmission.   

## 2018-05-19 NOTE — Progress Notes (Signed)
Remote ICD transmission.   

## 2018-05-25 ENCOUNTER — Ambulatory Visit (HOSPITAL_COMMUNITY)
Admission: RE | Admit: 2018-05-25 | Discharge: 2018-05-25 | Disposition: A | Discharge status: Home / Self Care | Payer: Medicare HMO | Source: Ambulatory Visit | Attending: Internal Medicine | Admitting: Internal Medicine

## 2018-05-25 ENCOUNTER — Other Ambulatory Visit (HOSPITAL_COMMUNITY): Payer: Self-pay | Admitting: *Deleted

## 2018-05-25 DIAGNOSIS — E854 Organ-limited amyloidosis: Secondary | ICD-10-CM | POA: Diagnosis present

## 2018-05-25 DIAGNOSIS — I43 Cardiomyopathy in diseases classified elsewhere: Secondary | ICD-10-CM | POA: Diagnosis present

## 2018-05-25 MED ORDER — METHYLPREDNISOLONE SODIUM SUCC 125 MG IJ SOLR: 80.0000 mg | Freq: Once | INTRAMUSCULAR | Status: DC

## 2018-05-25 MED ORDER — FAMOTIDINE 20 MG PO TABS: 20.0000 mg | ORAL_TABLET | Freq: Once | ORAL | Status: DC

## 2018-05-25 MED ORDER — FAMOTIDINE IN NACL 20-0.9 MG/50ML-% IV SOLN
INTRAVENOUS | Status: AC
  Filled 2018-05-25: qty 50

## 2018-05-25 MED ORDER — FAMOTIDINE IN NACL 20-0.9 MG/50ML-% IV SOLN: 20.0000 mg | Freq: Once | INTRAVENOUS | Status: DC

## 2018-05-25 MED ORDER — ACETAMINOPHEN 325 MG PO TABS
ORAL_TABLET | ORAL | Status: AC
  Filled 2018-05-25: qty 2

## 2018-05-25 MED ORDER — LORATADINE 10 MG PO TABS: 10.0000 mg | ORAL_TABLET | Freq: Once | ORAL | Status: DC

## 2018-05-25 MED ORDER — SODIUM CHLORIDE 0.9 % IV SOLN
12.0000 mg | Freq: Once | INTRAVENOUS | Status: AC
  Administered 2018-05-25: 12 mg via INTRAVENOUS
  Filled 2018-05-25 (×2): qty 6

## 2018-05-25 MED ORDER — METHYLPREDNISOLONE SODIUM SUCC 40 MG IJ SOLR
INTRAMUSCULAR | Status: AC
  Administered 2018-05-25: 80 mg
  Filled 2018-05-25: qty 2

## 2018-05-25 MED ORDER — FAMOTIDINE IN NACL 20-0.9 MG/50ML-% IV SOLN
20.0000 mg | Freq: Once | INTRAVENOUS | Status: AC
  Administered 2018-05-25: 20 mg via INTRAVENOUS

## 2018-05-25 MED ORDER — ACETAMINOPHEN 325 MG PO TABS: 650.0000 mg | ORAL_TABLET | Freq: Once | ORAL | Status: DC

## 2018-05-25 MED ORDER — DIPHENHYDRAMINE HCL 50 MG/ML IJ SOLN: 25.0000 mg | Freq: Once | INTRAMUSCULAR | Status: DC

## 2018-05-25 MED ORDER — ACETAMINOPHEN 325 MG PO TABS
650.0000 mg | ORAL_TABLET | Freq: Once | ORAL | Status: AC
  Administered 2018-05-25: 650 mg via ORAL

## 2018-05-25 MED ORDER — SODIUM CHLORIDE 0.9 % IV SOLN: 0.3000 mg/kg | INTRAVENOUS | Status: DC

## 2018-05-25 MED ORDER — LORATADINE 10 MG PO TABS
ORAL_TABLET | ORAL | Status: AC
  Filled 2018-05-25: qty 1

## 2018-05-25 MED ORDER — LORATADINE 10 MG PO TABS
10.0000 mg | ORAL_TABLET | Freq: Once | ORAL | Status: AC
  Administered 2018-05-25: 10 mg via ORAL

## 2018-05-25 NOTE — Progress Notes (Signed)
0730 paged Dr Gala Romney for orders for pt and he said his nurse got a message yesterday but he would call her and have her put them in. 0900 No orders as of yet. Jonetta Osgood, RN spoke with Angie at heart failure clinic and she said she would have Heather call us back. No orders as of yet.

## 2018-05-28 ENCOUNTER — Encounter: Payer: Self-pay | Admitting: Cardiology

## 2018-05-29 ENCOUNTER — Telehealth (HOSPITAL_COMMUNITY): Payer: Self-pay | Admitting: *Deleted

## 2018-05-29 NOTE — Telephone Encounter (Signed)
Completed reauthorization for pt's Onpattro (patisiran)  Medication approved 06/18/18 through 11/17/18.  Pt is aware

## 2018-06-11 ENCOUNTER — Other Ambulatory Visit (HOSPITAL_COMMUNITY): Payer: Self-pay | Admitting: Internal Medicine

## 2018-06-12 ENCOUNTER — Ambulatory Visit: Payer: Self-pay | Admitting: *Deleted

## 2018-06-12 NOTE — Telephone Encounter (Signed)
Pt called with shortness of breath and pain on the right side under her ribcage that started 06/11/18; today its more painful for her to get up; she says that she has a heart condition and her heart gets filled with liquid; she says that she has swelling in her abdomen and face; the pt states that this happens all the time; the pt's voice is also weak and raspy but she says that this is due to vocal cord injury; recommendations made per nurse triage protocol but the pt refused ED, explained to pt that based on her symptoms and past medical history she should go to ED; the pt refused;  also offered to initiate conference call with cardiology but the pt refused; conference call inititiated with Misty at [facility] because pt scheduled for new pt appointment with Dr Salomon Fick, [facility], 07/21/17; Misty also made the recommendation that the pt go to ED based on her symptoms and past medical history; once again the pt refused disposition; will route to office for notification     Reason for Disposition . [1] MODERATE difficulty breathing (e.g., speaks in phrases, SOB even at rest, pulse 100-120) AND [2] NEW-onset or WORSE than normal  Answer Assessment - Initial Assessment Questions 1. RESPIRATORY STATUS: "Describe your breathing?" (e.g., wheezing, shortness of breath, unable to speak, severe coughing)      Short of breath 2. ONSET: "When did this breathing problem begin?"      06/11/18 but worsening today 06/12/18 3. PATTERN "Does the difficult breathing come and go, or has it been constant since it started?"       constant 4. SEVERITY: "How bad is your breathing?" (e.g., mild, moderate, severe)    - MILD: No SOB at rest, mild SOB with walking, speaks normally in sentences, can lay down, no retractions, pulse < 100.    - MODERATE: SOB at rest, SOB with minimal exertion and prefers to sit, cannot lie down flat, speaks in phrases, mild retractions, audible wheezing, pulse 100-120.    - SEVERE: Very SOB at  rest, speaks in single words, struggling to breathe, sitting hunched forward, retractions, pulse > 120      mild 5. RECURRENT SYMPTOM: "Have you had difficulty breathing before?" If so, ask: "When was the last time?" and "What happened that time?"      Yes due to heart problem 6. CARDIAC HISTORY: "Do you have any history of heart disease?" (e.g., heart attack, angina, bypass surgery, angioplasty)      amylodsis 7. LUNG HISTORY: "Do you have any history of lung disease?"  (e.g., pulmonary embolus, asthma, emphysema)    Not sure 8. CAUSE: "What do you think is causing the breathing problem?"     Not sure  9. OTHER SYMPTOMS: "Do you have any other symptoms? (e.g., dizziness, runny nose, cough, chest pain, fever)     Runny nose; pain under right ribs, weight loss (went from 126 to 90 lbs since hospitalization) 10. PREGNANCY: "Is there any chance you are pregnant?" "When was your last menstrual period?"       no 11. TRAVEL: "Have you traveled out of the country in the last month?" (e.g., travel history, exposures)     no  Protocols used: BREATHING DIFFICULTY-A-AH

## 2018-06-12 NOTE — Telephone Encounter (Signed)
I have never seen this pt.

## 2018-06-13 ENCOUNTER — Other Ambulatory Visit (HOSPITAL_COMMUNITY): Payer: Self-pay | Admitting: Surgery

## 2018-06-13 DIAGNOSIS — I43 Cardiomyopathy in diseases classified elsewhere: Secondary | ICD-10-CM

## 2018-06-13 DIAGNOSIS — E854 Organ-limited amyloidosis: Secondary | ICD-10-CM

## 2018-06-14 ENCOUNTER — Ambulatory Visit (HOSPITAL_COMMUNITY)
Admission: RE | Admit: 2018-06-14 | Discharge: 2018-06-14 | Disposition: A | Discharge status: Home / Self Care | Payer: Medicare HMO | Source: Ambulatory Visit | Attending: Internal Medicine | Admitting: Internal Medicine

## 2018-06-14 DIAGNOSIS — I43 Cardiomyopathy in diseases classified elsewhere: Secondary | ICD-10-CM | POA: Diagnosis present

## 2018-06-14 DIAGNOSIS — E854 Organ-limited amyloidosis: Secondary | ICD-10-CM | POA: Insufficient documentation

## 2018-06-14 MED ORDER — ACETAMINOPHEN 325 MG PO TABS
650.0000 mg | ORAL_TABLET | Freq: Once | ORAL | Status: AC
  Administered 2018-06-14: 650 mg via ORAL

## 2018-06-14 MED ORDER — LORATADINE 10 MG PO TABS
ORAL_TABLET | ORAL | Status: AC
  Filled 2018-06-14: qty 1

## 2018-06-14 MED ORDER — ACETAMINOPHEN 325 MG PO TABS
ORAL_TABLET | ORAL | Status: AC
  Administered 2018-06-14: 650 mg via ORAL
  Filled 2018-06-14: qty 2

## 2018-06-14 MED ORDER — FAMOTIDINE IN NACL 20-0.9 MG/50ML-% IV SOLN
INTRAVENOUS | Status: AC
  Administered 2018-06-14: 20 mg via INTRAVENOUS
  Filled 2018-06-14: qty 50

## 2018-06-14 MED ORDER — LORATADINE 10 MG PO TABS
10.0000 mg | ORAL_TABLET | Freq: Once | ORAL | Status: AC
  Administered 2018-06-14: 10 mg via ORAL

## 2018-06-14 MED ORDER — METHYLPREDNISOLONE SODIUM SUCC 125 MG IJ SOLR
INTRAMUSCULAR | Status: AC
  Filled 2018-06-14: qty 2

## 2018-06-14 MED ORDER — SODIUM CHLORIDE 0.9 % IV SOLN
12.0000 mg | Freq: Once | INTRAVENOUS | Status: AC
  Administered 2018-06-14: 12 mg via INTRAVENOUS
  Filled 2018-06-14: qty 6

## 2018-06-14 MED ORDER — FAMOTIDINE IN NACL 20-0.9 MG/50ML-% IV SOLN
20.0000 mg | Freq: Once | INTRAVENOUS | Status: AC
  Administered 2018-06-14: 20 mg via INTRAVENOUS

## 2018-06-14 MED ORDER — METHYLPREDNISOLONE SODIUM SUCC 125 MG IJ SOLR
80.0000 mg | Freq: Once | INTRAMUSCULAR | Status: AC
  Administered 2018-06-14: 80 mg via INTRAVENOUS

## 2018-06-16 DIAGNOSIS — I469 Cardiac arrest, cause unspecified: Secondary | ICD-10-CM | POA: Diagnosis not present

## 2018-06-16 DIAGNOSIS — I255 Ischemic cardiomyopathy: Secondary | ICD-10-CM | POA: Diagnosis not present

## 2018-06-16 DIAGNOSIS — R55 Syncope and collapse: Secondary | ICD-10-CM | POA: Diagnosis not present

## 2018-06-16 DIAGNOSIS — R9431 Abnormal electrocardiogram [ECG] [EKG]: Secondary | ICD-10-CM | POA: Diagnosis not present

## 2018-06-16 DIAGNOSIS — I959 Hypotension, unspecified: Secondary | ICD-10-CM | POA: Diagnosis not present

## 2018-06-16 DIAGNOSIS — T68XXXA Hypothermia, initial encounter: Secondary | ICD-10-CM | POA: Diagnosis not present

## 2018-06-16 DIAGNOSIS — Z951 Presence of aortocoronary bypass graft: Secondary | ICD-10-CM | POA: Diagnosis not present

## 2018-06-16 DIAGNOSIS — E859 Amyloidosis, unspecified: Secondary | ICD-10-CM | POA: Diagnosis not present

## 2018-06-16 DIAGNOSIS — G459 Transient cerebral ischemic attack, unspecified: Secondary | ICD-10-CM | POA: Diagnosis not present

## 2018-06-16 DIAGNOSIS — I110 Hypertensive heart disease with heart failure: Secondary | ICD-10-CM | POA: Diagnosis not present

## 2018-06-16 DIAGNOSIS — R531 Weakness: Secondary | ICD-10-CM | POA: Diagnosis not present

## 2018-06-16 DIAGNOSIS — I5022 Chronic systolic (congestive) heart failure: Secondary | ICD-10-CM | POA: Diagnosis not present

## 2018-06-16 DIAGNOSIS — E8582 Wild-type transthyretin-related (ATTR) amyloidosis: Secondary | ICD-10-CM | POA: Diagnosis not present

## 2018-06-16 DIAGNOSIS — I2510 Atherosclerotic heart disease of native coronary artery without angina pectoris: Secondary | ICD-10-CM | POA: Diagnosis not present

## 2018-06-16 DIAGNOSIS — G8194 Hemiplegia, unspecified affecting left nondominant side: Secondary | ICD-10-CM | POA: Diagnosis not present

## 2018-06-16 DIAGNOSIS — J90 Pleural effusion, not elsewhere classified: Secondary | ICD-10-CM | POA: Diagnosis not present

## 2018-06-16 DIAGNOSIS — R29818 Other symptoms and signs involving the nervous system: Secondary | ICD-10-CM | POA: Diagnosis not present

## 2018-06-17 DIAGNOSIS — R918 Other nonspecific abnormal finding of lung field: Secondary | ICD-10-CM | POA: Diagnosis not present

## 2018-06-17 DIAGNOSIS — R55 Syncope and collapse: Secondary | ICD-10-CM | POA: Diagnosis not present

## 2018-06-17 DIAGNOSIS — J90 Pleural effusion, not elsewhere classified: Secondary | ICD-10-CM | POA: Diagnosis not present

## 2018-06-17 DIAGNOSIS — J9601 Acute respiratory failure with hypoxia: Secondary | ICD-10-CM | POA: Diagnosis not present

## 2018-06-17 DIAGNOSIS — Z951 Presence of aortocoronary bypass graft: Secondary | ICD-10-CM | POA: Diagnosis not present

## 2018-06-17 DIAGNOSIS — R0602 Shortness of breath: Secondary | ICD-10-CM | POA: Diagnosis not present

## 2018-06-17 DIAGNOSIS — R4020 Unspecified coma: Secondary | ICD-10-CM | POA: Diagnosis not present

## 2018-06-17 DIAGNOSIS — R0600 Dyspnea, unspecified: Secondary | ICD-10-CM | POA: Diagnosis not present

## 2018-06-17 DIAGNOSIS — J189 Pneumonia, unspecified organism: Secondary | ICD-10-CM | POA: Diagnosis not present

## 2018-06-17 DIAGNOSIS — R531 Weakness: Secondary | ICD-10-CM | POA: Diagnosis not present

## 2018-06-21 ENCOUNTER — Encounter (HOSPITAL_COMMUNITY): Payer: Medicare HMO | Admitting: Internal Medicine

## 2018-06-29 ENCOUNTER — Telehealth: Payer: Self-pay

## 2018-06-29 NOTE — Telephone Encounter (Signed)
Message left with patient offering to reschedule a visit with Palliative Care

## 2018-07-05 ENCOUNTER — Encounter (HOSPITAL_COMMUNITY): Payer: Medicare HMO

## 2018-07-06 ENCOUNTER — Ambulatory Visit (HOSPITAL_COMMUNITY)
Admission: RE | Admit: 2018-07-06 | Discharge: 2018-07-06 | Disposition: A | Discharge status: Home / Self Care | Payer: Medicare HMO | Source: Ambulatory Visit | Attending: Internal Medicine | Admitting: Internal Medicine

## 2018-07-06 ENCOUNTER — Other Ambulatory Visit (HOSPITAL_COMMUNITY): Payer: Self-pay | Admitting: Pharmacist

## 2018-07-06 ENCOUNTER — Other Ambulatory Visit (HOSPITAL_COMMUNITY): Payer: Self-pay

## 2018-07-06 DIAGNOSIS — E852 Heredofamilial amyloidosis, unspecified: Secondary | ICD-10-CM

## 2018-07-06 DIAGNOSIS — E8582 Wild-type transthyretin-related (ATTR) amyloidosis: Secondary | ICD-10-CM

## 2018-07-06 MED ORDER — SODIUM CHLORIDE 0.9 % IV SOLN: 12.0000 mg | Freq: Once | INTRAVENOUS | Status: DC

## 2018-07-06 MED ORDER — METHYLPREDNISOLONE SODIUM SUCC 125 MG IJ SOLR
80.0000 mg | Freq: Once | INTRAMUSCULAR | Status: AC
  Administered 2018-07-06: 80 mg via INTRAVENOUS

## 2018-07-06 MED ORDER — FAMOTIDINE IN NACL 20-0.9 MG/50ML-% IV SOLN
20.0000 mg | Freq: Once | INTRAVENOUS | Status: AC
  Administered 2018-07-06: 20 mg via INTRAVENOUS

## 2018-07-06 MED ORDER — SODIUM CHLORIDE 0.9 % IV SOLN
12.0000 mg | Freq: Once | INTRAVENOUS | Status: AC
  Administered 2018-07-06: 12 mg via INTRAVENOUS
  Filled 2018-07-06: qty 6

## 2018-07-06 MED ORDER — METHYLPREDNISOLONE SODIUM SUCC 125 MG IJ SOLR: 80.0000 mg | Freq: Once | INTRAMUSCULAR | Status: DC

## 2018-07-06 MED ORDER — LORATADINE 10 MG PO TABS
ORAL_TABLET | ORAL | Status: AC
  Administered 2018-07-06: 10 mg via ORAL
  Filled 2018-07-06: qty 1

## 2018-07-06 MED ORDER — FAMOTIDINE IN NACL 20-0.9 MG/50ML-% IV SOLN: 20.0000 mg | Freq: Once | INTRAVENOUS | Status: DC

## 2018-07-06 MED ORDER — ACETAMINOPHEN 325 MG PO TABS: 650.0000 mg | ORAL_TABLET | Freq: Once | ORAL | Status: DC

## 2018-07-06 MED ORDER — LORATADINE 10 MG PO TABS: 10.0000 mg | ORAL_TABLET | Freq: Once | ORAL | Status: DC

## 2018-07-06 MED ORDER — METHYLPREDNISOLONE SODIUM SUCC 125 MG IJ SOLR
INTRAMUSCULAR | Status: AC
  Administered 2018-07-06: 80 mg via INTRAVENOUS
  Filled 2018-07-06: qty 2

## 2018-07-06 MED ORDER — FAMOTIDINE IN NACL 20-0.9 MG/50ML-% IV SOLN
INTRAVENOUS | Status: AC
  Administered 2018-07-06: 20 mg via INTRAVENOUS
  Filled 2018-07-06: qty 50

## 2018-07-06 MED ORDER — ACETAMINOPHEN 325 MG PO TABS
ORAL_TABLET | ORAL | Status: AC
  Administered 2018-07-06: 650 mg via ORAL
  Filled 2018-07-06: qty 2

## 2018-07-06 MED ORDER — ACETAMINOPHEN 325 MG PO TABS
650.0000 mg | ORAL_TABLET | Freq: Once | ORAL | Status: AC
  Administered 2018-07-06: 650 mg via ORAL

## 2018-07-06 MED ORDER — LORATADINE 10 MG PO TABS
10.0000 mg | ORAL_TABLET | Freq: Once | ORAL | Status: AC
  Administered 2018-07-06: 10 mg via ORAL

## 2018-07-14 ENCOUNTER — Other Ambulatory Visit (HOSPITAL_COMMUNITY): Payer: Self-pay | Admitting: Internal Medicine

## 2018-07-18 LAB — CUP PACEART REMOTE DEVICE CHECK
Battery Remaining Longevity: 106 mo
Brady Statistic AP VP Percent: 0.02 %
Brady Statistic AP VS Percent: 33.98 %
Brady Statistic AS VP Percent: 0.02 %
Brady Statistic AS VS Percent: 65.98 %
Brady Statistic RA Percent Paced: 33.89 %
Brady Statistic RV Percent Paced: 0.04 %
Date Time Interrogation Session: 20191101125543
HighPow Impedance: 37 Ohm
Implantable Lead Implant Date: 20170127
Implantable Lead Implant Date: 20170127
Implantable Lead Location: 753859
Implantable Lead Location: 753860
Implantable Lead Model: 5076
Implantable Pulse Generator Implant Date: 20170127
Lead Channel Impedance Value: 247 Ohm
Lead Channel Impedance Value: 304 Ohm
Lead Channel Impedance Value: 361 Ohm
Lead Channel Pacing Threshold Amplitude: 0.75 V
Lead Channel Pacing Threshold Amplitude: 0.875 V
Lead Channel Pacing Threshold Pulse Width: 0.4 ms
Lead Channel Sensing Intrinsic Amplitude: 2.25 mV
Lead Channel Sensing Intrinsic Amplitude: 4.5 mV
Lead Channel Sensing Intrinsic Amplitude: 4.5 mV
Lead Channel Setting Pacing Amplitude: 1.5 V
Lead Channel Setting Pacing Amplitude: 2 V
Lead Channel Setting Pacing Pulse Width: 0.4 ms
Lead Channel Setting Sensing Sensitivity: 0.3 mV
MDC IDC MSMT BATTERY VOLTAGE: 2.99 V
MDC IDC MSMT LEADCHNL RA PACING THRESHOLD PULSEWIDTH: 0.4 ms
MDC IDC MSMT LEADCHNL RA SENSING INTR AMPL: 2.25 mV

## 2018-07-21 ENCOUNTER — Ambulatory Visit (INDEPENDENT_AMBULATORY_CARE_PROVIDER_SITE_OTHER): Payer: Medicare HMO | Admitting: Family Medicine

## 2018-07-21 ENCOUNTER — Encounter: Payer: Self-pay | Admitting: Family Medicine

## 2018-07-21 VITALS — BP 106/62 | HR 74 | Temp 97.5°F | Ht 61.0 in | Wt 99.0 lb

## 2018-07-21 DIAGNOSIS — Z7689 Persons encountering health services in other specified circumstances: Secondary | ICD-10-CM

## 2018-07-21 DIAGNOSIS — I951 Orthostatic hypotension: Secondary | ICD-10-CM

## 2018-07-21 DIAGNOSIS — I5042 Chronic combined systolic (congestive) and diastolic (congestive) heart failure: Secondary | ICD-10-CM

## 2018-07-21 DIAGNOSIS — Z8709 Personal history of other diseases of the respiratory system: Secondary | ICD-10-CM

## 2018-07-21 DIAGNOSIS — E854 Organ-limited amyloidosis: Secondary | ICD-10-CM

## 2018-07-21 DIAGNOSIS — I43 Cardiomyopathy in diseases classified elsewhere: Secondary | ICD-10-CM

## 2018-07-21 NOTE — Patient Instructions (Signed)
Cardiomyopathy, Adult         Cardiomyopathy is a long-term (chronic) disease of the heart muscle. The disease makes the heart muscle thick, weak, or stiff. As a result, the heart works harder to pump blood. Over time cardiomyopathy can lead to an irregular heartbeat (arrhythmia) and heart failure.  There are several types of cardiomyopathy:   Dilated cardiomyopathy. This type causes the ventricles to become weak and stretched out.   Hypertrophic cardiomyopathy. This type causes the heart muscle to thicken.   Restrictive cardiomyopathy. This type causes the heart muscle to become stiff.   Ischemic cardiomyopathy. This type involves narrowing arteries that cause the walls of the heart to get thinner.   Peripartum cardiomyopathy. This type occurs during pregnancy or shortly after pregnancy.  What are the causes?  This condition may be caused by:   A gene that is passed down (inherited) from a family member.   A medical condition that damages the heart, such as:  ? Diabetes.  ? High blood pressure.  ? Viral infection of the heart.  ? Heart attack.  ? Coronary heart disease.   Alcoholism.   Using illegal drugs or some prescription medicines.   Pregnancy.   Your body absorbing and storing too much iron (hemochromatosis).   Autoimmune diseases, connective tissue diseases, endocrine diseases, and muscle diseases.   Cancer treatments.   Buildup of proteins in your organs (amyloidosis), or inflammation in your organs (sarcoidosis).  Often, the cause is not known.  What increases the risk?  This condition is more likely to develop in people who:   Have a family history of cardiomyopathy or other heart problems.   Are overweight or obese.   Use illegal drugs.   Abuse alcohol.   Have a medical condition that damages the heart.  What are the signs or symptoms?  Symptoms of this condition include:   Shortness of breath, especially during activity.   Fatigue.   An irregular heartbeat and heart  murmurs.   Dizziness.   Light-headedness.   Fainting.   Chest pain.   Coughing.   Swelling in the lower legs, ankles, feet, abdomen, and neck veins.  Often, people with this condition have no symptoms.  How is this diagnosed?  This condition is diagnosed based on:   Your symptoms and medical history.   A physical exam.   Tests.  Tests may include:   Blood tests.   Imaging studies of your heart, such as:  ? X-rays.  ? An echocardiogram.  ? An MRI.   An electrocardiogram (ECG). This records your heart's electrical activity.   A test in which you wear a portable device (event monitor) to record your heart's electrical activity while you go about your day.   A stress test. This monitors your heart's activity while exercising.   Cardiac catheterization. This procedure checks the blood pressure and blood flow in your heart.   An angiogram. This is an injection of dye into your arteries before imaging studies are taken.   Heart tissue biopsy. This removes a sample of heart tissue for examination.  How is this treated?  Treatment for this condition depends on the type of cardiomyopathy you have and the severity of your symptoms. If you do not have symptoms, you may not need treatment. If you need treatment, it may include:   Lifestyle changes, such as:  ? Eating a heart-healthy diet that includes plenty of fruits, vegetables, and whole grains, and cutting down on salt (sodium).  ?   Maintaining a healthy weight, and losing weight, if needed.  ? Getting regular exercise.  ? Quitting smoking, if you smoke.  ? Avoiding alcohol.  ? Medicine to:   Lower your blood pressure.   Slow down your heart rate.   Keep your heart beating in a steady rhythm.   Clear excess fluids from your body.   Prevent blood clots.   Balance minerals (electrolytes) in your body and get rid of extra sodium in your body.   Reduce inflammation.   Strengthen your heartbeat.  ? Surgery to:   Repair a defect.   Remove thickened  tissue.   Destroy tissues in the area of abnormal electrical activity (ablation).   Implant a device to treat serious heart rhythm problems (implantable cardioverter-defibrillator, or ICD), or a pacemaker.   Replace your heart (heart transplant) if all other treatments have failed (end stage).  Other treatments may include cardiac resynchronization therapy (CRT) or a left ventricular assist device (LVAD).  Follow these instructions at home:  Lifestyle     Eat a heart-healthy diet. Work with your health care provider or a registered dietitian to learn about healthy eating options.   Maintain a healthy weight.   Stay physically active. Ask your health care provider to suggest some activities that are good for you.   Do not use any products that contain nicotine or tobacco, such as cigarettes and e-cigarettes. If you need help quitting, ask your health care provider.   Limit alcohol intake to no more than one drink per day for nonpregnant women and no more than two drinks per day for men. One drink equals 12 oz of beer, 5 oz of wine, or 1 oz of hard liquor.   Try to get at least 7 hours of sleep each night.   Find healthy ways to manage stress.  General instructions   Take over-the-counter and prescription medicines only as told by your health care provider. Some medicines can be dangerous for your heart.   Tell all health care providers, including your dentist, that you have cardiomyopathy. When you visit the dentist or have surgery, ask your health care provider if you need antibiotics before having dental care or before the surgery.   Ask your health care provider if you should wear a medical identification bracelet. This may be important if you have a pacemaker or a defibrillator.   Make sure you get all recommended vaccinations and an annual flu shot.   Work closely with your health care provider to manage any long-lasting (chronic) conditions.   Keep all follow-up visits as told by your health  care provider. This is important, even if you do not have any symptoms. Your health care provider may need to make sure your condition is not getting worse.  How is this prevented?  This condition cannot be prevented. Parents, siblings, and children of people with this condition may be at risk for the condition. It is a good idea for them to get screened for the condition because it is best when cardiomyopathy is found early. Screening is done with an ECG and echocardiogram.  People who want to start a family may also want to meet with a genetic counselor to discuss the risk of having a child with cardiomyopathy.  Contact a health care provider if:   Your symptoms get worse.   You have new symptoms.  Get help right away if:   You have severe chest pain.   You have shortness of breath.     You cough up pink, bubbly material.   You have sudden sweating.   You feel nauseous and you vomit.   You suddenly become light-headed or dizzy.   You feel your heart beating very quickly.   It feels like your heart is skipping beats.  These symptoms may represent a serious problem that is an emergency. Do not wait to see if the symptoms will go away. Get medical help right away. Call your local emergency services (911 in the [place] Do not drive yourself to the hospital.  Summary   Cardiomyopathy is a long-term (chronic) disease of the heart muscle.   Over time cardiomyopathy can lead to an irregular heartbeat (arrhythmia) and heart failure.  This information is not intended to replace advice given to you by your health care provider. Make sure you discuss any questions you have with your health care provider.  Document Released: 09/17/2004 Document Revised: 09/24/2016 Document Reviewed: 01/05/2016  Elsevier Interactive Patient Education  2019 Elsevier Inc.

## 2018-07-21 NOTE — Progress Notes (Signed)
Patient presents to clinic today to establish care.  SUBJECTIVE: PMH: Pt is a 58 yo female with pmh sig for h/o afib,  hypotension, CHF, amyloid heart dz, h/o pleural effusion, hoarse voice.  Pt was previously seen by Dr. Darrow Bussingibas Koirala at [facility].   Pt is followed by Dr. Gala RomneyBensimhon, Cardiology.  Pt notes she is very sick but is trying to stay active.  Pt is interested in outpt PT so she can get out of the house.  Extensive Cardiac hx  (Mitral valve repair, LA clipping, PDA ligation, h/o cardiac arrest, afib, T TR amyloid heart dz): -pt notes 3 yrs ago had heart surgery.  Per chart review severe MR s/p mitral valve repair with L atrial clipping and PDA closure -per pt d/c home, went into heart failure and cardiac arrest, revived x 3.  VT and afib also noted, treated with hypothermia  -At the time EF 45% and ICD placed for QT prolongation.   -experienced severe orthostasis and recurrent syncope.  [facility] Tilt table testing 11/17.  Did not tolerate M[place]. -Repeat MRI with concern for infiltrative dz.  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 -currently receiving Onpattro infusions q 3 wks. Taking Vit A as Onpattro can deplete -no longer taking gabapentin for neuropathic pain  Horse voice: -notes continued hoarseness -seen by ENT.   -states vocal cords are stretched. -voice becomes weaker with continued talking  H/o pleural effusion: -notes having catheter placed for recurrent L pleural effusion 10/20/16  Allergies: Alcohol swabs-hives, itching Vicodin-hallucinations Benadryl-jerking movements Adhesive on tape-itching, rash.  Paper tape okay to use Cymbalta-nausea, vomiting, dizziness Oxycodone-nausea, feels "strange"  Social history: Pt is [place].  She is divorced.  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  Pt has a sister who lives in [place] who is a Development worker, communityphysician.  Health  Maintenance: Immunizations --influenza vaccine 2019, tetanus 2016 Colonoscopy --2015 Mammogram --2015 PAP -- 2015?  Family medical history: Mom-MI, heart disease Dad-diabetes Sister,Jua- HLD Brother-Charles, HLD MGM-heart disease  Past Medical History:  Diagnosis Date  . AICD (automatic cardioverter/defibrillator) present   . Anemia   . Anxiety   . Aortic aneurysm (HCC)   . Arthritis   . Cardiac arrest (HCC)   . Chronic combined systolic and diastolic CHF (congestive heart failure) (HCC) 07/03/2015   Grade 3 diastolic dysfunction.  LVEF 40-45% 05/2015.  Marland Kitchen. Depression    SOME DEPRESSION (ON MEDS FOR 2 YRS) WHEN HER MOTHER PASSED  . Patent ductus arteriosus 07/16/2015  . 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 07/22/2015   26 mm Sorin Memo 3D ring annuloplasty with ligation of patent ductus arteriosus and clipping of LA appendage  . Severe mitral regurgitation 07/03/2015  . Shortness of breath dyspnea     Past Surgical History:  Procedure Laterality Date  . CARDIAC CATHETERIZATION N/A 07/08/2015   Procedure: Right/Left Heart Cath and Coronary Angiography;  Surgeon: Marykay Lexavid W Harding, MD;  [facility];  Service: Cardiovascular;  Laterality: N/A;  . CHEST TUBE INSERTION Left 10/21/2015   Procedure: INSERTION left PLEURAL DRAINAGE CATHETER;  Surgeon: Purcell Nailslarence H Owen, MD;  Location: [facility] OR;  Service: Thoracic;  Laterality: Left;  . CLIPPING OF ATRIAL APPENDAGE  07/22/2015   Procedure: CLIPPING OF LEFT ATRIAL APPENDAGE;  Surgeon: Purcell Nailslarence H Owen, MD;  Location: [facility] OR;  Service: Open Heart Surgery;;  [address]  Pro  . COLONOSCOPY    . EP IMPLANTABLE DEVICE N/A 08/15/2015   Procedure: ICD Implant;  Surgeon: Will Jorja Loa, MD;  Location: [facility] INVASIVE CV LAB;  Service: Cardiovascular;  Laterality: N/A;  . MICROLARYNGOSCOPY WITH CO2 LASER AND EXCISION OF VOCAL CORD LESION N/A 11/04/2015   Procedure: MICROLARYNGOSCOPY, RADIESSE VOCAL CORD  AUGMENTATION;  Surgeon: Flo Shanks, MD;  Location: [facility] OR;  Service: ENT;  Laterality: N/A;  . MITRAL VALVE REPAIR N/A 07/22/2015   Procedure: MITRAL VALVE REPAIR;  Surgeon: Purcell Nails, MD;  Location: [facility] OR;  Service: Open Heart Surgery;  Laterality: N/A;  26 Sorin Memo 3D  . OVARIAN CYST REMOVAL    . PATENT DUCTUS ARTERIOUS REPAIR N/A 07/22/2015   Procedure: PATENT DUCTUS ARTERIOSUS (PDA) ligation;  Surgeon: Purcell Nails, MD;  Location: [facility] OR;  Service: Open Heart Surgery;  Laterality: N/A;  . REMOVAL OF PLEURAL DRAINAGE CATHETER Left 11/25/2017   Procedure: REMOVAL OF PLEURAL DRAINAGE CATHETER;  Surgeon: Purcell Nails, MD;  Location[facility] OR;  Service: Thoracic;  Laterality: Left;  . TEE WITHOUT CARDIOVERSION N/A 07/07/2015   Procedure: TRANSESOPHAGEAL ECHOCARDIOGRAM (TEE);  Surgeon: Chilton Si, MD;  Location: [facility] ENDOSCOPY;  Service: Cardiovascular;  Laterality: N/A;  . TEE WITHOUT CARDIOVERSION N/A 07/22/2015   Procedure: TRANSESOPHAGEAL ECHOCARDIOGRAM (TEE);  Surgeon: Purcell Nails, MD;  Location: [facility] OR;  Service: Open Heart Surgery;  Laterality: N/A;    Current Outpatient Medications on File Prior to Visit  Medication Sig Dispense Refill  . aspirin EC 81 MG tablet Take 81 mg by mouth daily at 3 pm.    . CORLANOR 5 MG TABS tablet TAKE 1 TABLET (5 MG TOTAL) BY MOUTH 2 (TWO) TIMES DAILY WITH A MEAL. 180 tablet 1  . midodrine (PROAMATINE) 10 MG tablet Take 1.5 tablets (15 mg total) by mouth 3 (three) times daily with meals. 405 tablet 3  . Polyethyl Glycol-Propyl Glycol (LUBRICANT EYE DROPS) 0.4-0.3 % SOLN Place 1 drop into both eyes 2 (two) times daily.    . potassium chloride (K-DUR) 10 MEQ tablet TAKE 1 TABLET BY MOUTH EVERY DAY 90 tablet 2  . torsemide (DEMADEX) 20 MG tablet TAKE 1 TABLET BY MOUTH EVERY DAY 90 tablet 2  . vitamin A 26415 UNIT capsule Take 1 capsule (10,000 Units total) by mouth daily.     No current facility-administered medications on file prior to visit.       Allergies  Allergen Reactions  . Isopropyl Alcohol Hives and Itching    ALCOHOL SWABS  . Vicodin [Hydrocodone-Acetaminophen] Other (See Comments)    "she feels out of it", hallucinating  . Benadryl [Diphenhydramine] Other (See Comments)    Has uncontrollable jerking movements with IV benadryl  . Adhesive [Tape] Itching and Rash  . Cymbalta [Duloxetine Hcl] Nausea And Vomiting and Other (See Comments)    Excessive dizziness  . Oxycodone Nausea Only and Other (See Comments)    Makes her feel strange   . Silicone Itching and Rash    TAPE ALLERGY/EKG STICKER ALLERGY, Please use "paper" tape only    Family History  Problem Relation Age of Onset  . Hypertension Mother   . Heart disease Mother   . Diabetes Father   . Stomach cancer Father     Social History   Socioeconomic History  . Marital status: Married    Spouse name: Not on file  . Number of children: 2  . Years of education: Not on file  . Highest education  level: Not on file  Occupational History  . Occupation: Retail bankereamtress  Social Needs  . Financial resource strain: Not on file  . Food insecurity:    Worry: Not on file    Inability: Not on file  . Transportation needs:    Medical: Not on file    Non-medical: Not on file  Tobacco Use  . Smoking status: Never Smoker  . Smokeless tobacco: Never Used  Substance and Sexual Activity  . Alcohol use: No    Alcohol/week: 0.0 standard drinks  . Drug use: No  . Sexual activity: Not on file  Lifestyle  . Physical activity:    Days per week: Not on file    Minutes per session: Not on file  . Stress: Not on file  Relationships  . Social connections:    Talks on phone: Not on file    Gets together: Not on file    Attends religious service: Not on file    Active member of club or organization: Not on file    Attends meetings of clubs or organizations: Not on file    Relationship status: Not on file  . Intimate partner violence:    Fear of current or ex partner:  Not on file    Emotionally abused: Not on file    Physically abused: Not on file    Forced sexual activity: Not on file  Other Topics Concern  . Not on file  Social History Narrative   She lives alone.  She had two grown children.     ROS General: Denies fever, chills, night sweats   +fatigue, decreased appetite, decreased weight HEENT: Denies headaches, ear pain, changes in vision, rhinorrhea, sore throat  +hoarse voice CV: Denies CP, palpitations, SOB, orthopnea Pulm: Denies SOB, cough, wheezing GI: Denies abdominal pain, nausea, vomiting, diarrhea, constipation GU: Denies dysuria, hematuria, frequency, vaginal discharge Msk: Denies muscle cramps, joint pains Neuro: Denies weakness, numbness, tingling Skin: Denies rashes, bruising Psych: Denies depression, anxiety, hallucinations  BP 106/62 (BP Location: Left Arm, Patient Position: Sitting, Cuff Size: Small)   Pulse 74   Temp (!) 97.5 F (36.4 C) (Oral)   Ht 5\' 1"  (1.549 m)   Wt 99 lb (44.9 kg)   SpO2 98%   BMI 18.71 kg/m   Physical Exam Gen. Pleasant but weak appearing, well developed, thin, in NAD.  Voice weak/hoarse. HEENT - [place]/AT, PERRL, conjunctive clear, no scleral icterus, no nasal drainage, pharynx without erythema or exudate.  TMs normal b/l. Lungs: no use of accessory muscles, CTAB, no wheezes, rales or rhonchi Cardiovascular: RRR, No r/g/m, no peripheral edema Abdomen: BS present, soft, nontender,nondistended Neuro:  A&Ox3, CN II-XII intact, gait not assessed.  Patient sitting in wheelchair. Skin:  Warm, dry, intact, no lesions  Recent Results (from the past 2160 hour(s))  CUP PACEART REMOTE DEVICE CHECK     Status: None   Collection Time: 05/18/18 12:55 PM  Result Value Ref Range   Date Time Interrogation Session [id number]    Pulse Generator Manufacturer MERM    Pulse Gen Model [id number] Evera MRI XT DR    Pulse Gen Serial Number [id number]    Clinic Name [facility]    Implantable Pulse  Generator Type Implantable Cardiac Defibulator    Implantable Pulse Generator Implant Date [id number]    Implantable Lead Manufacturer [facility]    Implantable Lead Model 5076 CapSureFix Novus MRI SureScan    Implantable Lead Serial Number [id number]    Implantable Lead Implant Date [id number]  Implantable Lead Location Detail 1 APPENDAGE    Implantable Lead Location [id number]    Implantable Lead Manufacturer [facility]    Implantable Lead Model [id number] Sprint Quattro Secure S MRI SureScan    Implantable Lead Serial Number [id number]    Implantable Lead Implant Date [id number]    Implantable Lead Location Detail 1 APEX    Implantable Lead Location [id number]    Lead Channel Setting Sensing Sensitivity 0.3 mV   Lead Channel Setting Pacing Amplitude 1.5 V   Lead Channel Setting Pacing Pulse Width 0.4 ms   Lead Channel Setting Pacing Amplitude 2 V   Lead Channel Impedance Value 361 ohm   Lead Channel Sensing Intrinsic Amplitude 2.25 mV   Lead Channel Sensing Intrinsic Amplitude 2.25 mV   Lead Channel Pacing Threshold Amplitude 0.75 V   Lead Channel Pacing Threshold Pulse Width 0.4 ms   Lead Channel Impedance Value 304 ohm   Lead Channel Impedance Value 247 ohm   Lead Channel Sensing Intrinsic Amplitude 4.5 mV   Lead Channel Sensing Intrinsic Amplitude 4.5 mV   Lead Channel Pacing Threshold Amplitude 0.875 V   Lead Channel Pacing Threshold Pulse Width 0.4 ms   HighPow Impedance 37 ohm   Battery Status OK    Battery Remaining Longevity 106 mo   Battery Voltage 2.99 V   Brady Statistic RA Percent Paced 33.89 %   Brady Statistic RV Percent Paced 0.04 %   Brady Statistic AP VP Percent 0.02 %   Brady Statistic AS VP Percent 0.02 %   Brady Statistic AP VS Percent 33.98 %   Brady Statistic AS VS Percent 65.98 %    Assessment/Plan: Amyloid heart disease (HCC) -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   -Managed by Cardiology, Dr. Gala Romney and [facility] -continue Onpattro infusions q 3 wks -continue Vitamin A 02334 IU daily  Chronic Systolic and diastolic Heart Failure -cMRI EF 38% 04/13/17 -continue Torsemide 20 mg daily, ivabradine 5 mg BID, and potassium -given hypotension, unable to tolerate additional medications for heart failure -ICD in place  Orthostatic hypotension -stable  -thought 2/2 autonomic dysfunction from TTR neuropathy. -+ tilt [facility], Dr. Read Drivers. -continue midodrine 15 mg TID with meals -continue f/u with Cardiology  History of pleural effusion -resolved -lungs clear on exam -given h/o recurrent effusions will monitor closely -continue f/u with Dr. Cornelius Moras  Encounter to establish care -We reviewed the PMH, PSH, FH, SH, Meds and Allergies. -We provided refills for any medications we will prescribe as needed. -We addressed current concerns per orders and patient instructions. -We have asked for records for pertinent exams, studies, vaccines and notes from previous providers. -We have advised patient to follow up per instructions below.  F/u in 1 month, sooner if needed  More than 50% of over 35  minutes spent in total face-to-face with the patient, counseling and/or coordinating care.   Abbe Amsterdam, MD

## 2018-07-24 ENCOUNTER — Encounter: Payer: Self-pay | Admitting: Family Medicine

## 2018-07-26 ENCOUNTER — Telehealth (HOSPITAL_COMMUNITY): Payer: Self-pay | Admitting: Licensed Clinical Social Worker

## 2018-07-26 ENCOUNTER — Ambulatory Visit (HOSPITAL_COMMUNITY)
Admission: RE | Admit: 2018-07-26 | Discharge: 2018-07-26 | Disposition: A | Discharge status: Home / Self Care | Payer: Medicare HMO | Source: Ambulatory Visit | Attending: Internal Medicine | Admitting: Internal Medicine

## 2018-07-26 ENCOUNTER — Encounter (HOSPITAL_COMMUNITY): Payer: Self-pay | Admitting: Internal Medicine

## 2018-07-26 VITALS — BP 120/82 | HR 67 | Wt 99.2 lb

## 2018-07-26 DIAGNOSIS — I5022 Chronic systolic (congestive) heart failure: Secondary | ICD-10-CM

## 2018-07-26 DIAGNOSIS — G629 Polyneuropathy, unspecified: Secondary | ICD-10-CM | POA: Diagnosis not present

## 2018-07-26 DIAGNOSIS — J90 Pleural effusion, not elsewhere classified: Secondary | ICD-10-CM | POA: Diagnosis not present

## 2018-07-26 DIAGNOSIS — I951 Orthostatic hypotension: Secondary | ICD-10-CM | POA: Diagnosis not present

## 2018-07-26 DIAGNOSIS — Z7982 Long term (current) use of aspirin: Secondary | ICD-10-CM | POA: Insufficient documentation

## 2018-07-26 DIAGNOSIS — E854 Organ-limited amyloidosis: Secondary | ICD-10-CM | POA: Insufficient documentation

## 2018-07-26 DIAGNOSIS — Z8249 Family history of ischemic heart disease and other diseases of the circulatory system: Secondary | ICD-10-CM | POA: Diagnosis not present

## 2018-07-26 DIAGNOSIS — M1990 Unspecified osteoarthritis, unspecified site: Secondary | ICD-10-CM | POA: Diagnosis not present

## 2018-07-26 DIAGNOSIS — Z79899 Other long term (current) drug therapy: Secondary | ICD-10-CM | POA: Insufficient documentation

## 2018-07-26 DIAGNOSIS — Z952 Presence of prosthetic heart valve: Secondary | ICD-10-CM | POA: Insufficient documentation

## 2018-07-26 DIAGNOSIS — Z8674 Personal history of sudden cardiac arrest: Secondary | ICD-10-CM | POA: Diagnosis not present

## 2018-07-26 DIAGNOSIS — Z95810 Presence of automatic (implantable) cardiac defibrillator: Secondary | ICD-10-CM | POA: Insufficient documentation

## 2018-07-26 DIAGNOSIS — I5042 Chronic combined systolic (congestive) and diastolic (congestive) heart failure: Secondary | ICD-10-CM | POA: Diagnosis present

## 2018-07-26 DIAGNOSIS — Z888 Allergy status to other drugs, medicaments and biological substances status: Secondary | ICD-10-CM | POA: Insufficient documentation

## 2018-07-26 DIAGNOSIS — I43 Cardiomyopathy in diseases classified elsewhere: Secondary | ICD-10-CM

## 2018-07-26 DIAGNOSIS — Z885 Allergy status to narcotic agent status: Secondary | ICD-10-CM | POA: Insufficient documentation

## 2018-07-26 DIAGNOSIS — I480 Paroxysmal atrial fibrillation: Secondary | ICD-10-CM | POA: Insufficient documentation

## 2018-07-26 LAB — BASIC METABOLIC PANEL
Anion gap: 12 (ref 5–15)
BUN: 30 mg/dL — ABNORMAL HIGH (ref 6–20)
CO2: 23 mmol/L (ref 22–32)
Calcium: 8.9 mg/dL (ref 8.9–10.3)
Chloride: 102 mmol/L (ref 98–111)
Creatinine, Ser: 0.93 mg/dL (ref 0.44–1.00)
GFR calc Af Amer: >60 mL/min (ref >60)
GFR calc non Af Amer: >60 mL/min (ref >60)
Glucose, Bld: 80 mg/dL (ref 70–99)
Potassium: 4.3 mmol/L (ref 3.5–5.1)
Sodium: 137 mmol/L (ref 135–145)

## 2018-07-26 MED ORDER — SPIRONOLACTONE 25 MG PO TABS: 12.5000 mg | ORAL_TABLET | Freq: Every day | ORAL | 3 refills | Status: AC

## 2018-07-26 MED ORDER — POTASSIUM CHLORIDE ER 10 MEQ PO TBCR: 10.0000 meq | EXTENDED_RELEASE_TABLET | Freq: Every day | ORAL | 2 refills | Status: AC

## 2018-07-26 MED ORDER — TORSEMIDE 20 MG PO TABS: 20.0000 mg | ORAL_TABLET | Freq: Every day | ORAL | 2 refills | Status: DC

## 2018-07-26 NOTE — Progress Notes (Signed)
Patient ID: Diane HahnJina E Feigel, female   DOB: [birth year], 58 y.o.   MRN: [id number]    Advanced Heart Failure Clinic Note   Date:  07/26/2018   ID:  Diane HahnJina E Fontenette, DOB [birth year], MRN [id number]  PCP:  Deeann SaintBanks, Shannon R, MD  Cardiologist:   Dr. [facility] Salviaandolph Electrophysiologist: Dr. Berton MountSteve Klein   [name] is a 58 y.o. female who is referred to the HF Clinic by Dr. [facility] Salviaandolph for further evaluation of her chronic systolic and diastolic heart failure and possible infiltrative CM.  Symptoms started in 2016 with syncope and DOE. Referred to Dr. [facility] Salviaandolph in 12/16. Echocardiography revealed severe mitral regurgitation and a reduced LVEF (45-50%).  The MR was due to tethering of the posterior leaflet.  She was also noted to have grade 3 diastolic dysfunction with mid basal to mid inferolateral wall hypokinesis.  On 07/22/15 underwent repair of her mitral valve with a 26 mm Sorin Memo 3D annuloplasty ring on 07/22/15 by Dr. Cornelius Moraswen.  Intra-operatively she was noted to have a PDA with left to right shunting that was ligated in the OR. The left atrial appendage was also clipped.   On 08/05/15 she suffered a cardiac arrest at home and was resuscitated by EMS.  The initial rhythm was unclear but she had VT upon arrival in the ED, which was followed by atrial fibrillation.  She was treated with hypothermia and had prolonged hospital course. Fortunately she has no residual cognitive deficits.  During that hospitalization she underwent cardiac MRI with LVEF 45% with hypokinesis of the distal septum and apex.  There was diffuse, delayed enhancement in the subendocardium and mid epicardial region, most prominent in the inferior wall.  This was concerning for myocarditis or infiltrative cardiomyopathy.  Her EKG showed QT prolongation and she had an ICD placed for secondary prevention.  Her hospitalization was delayed by severe orthostatic hypotension and recurrent syncope requiring high doses of florinef and midodrine  which were weaned down.  She was discharged home but was readmitted with volume overload on 2/16.  Echo showed LVEF 35% with diffuse hypokinesis and her mitral valve was functioning well.    She underwent Pleurex catheter placement for recurrent left pleural effusion on 10/20/16. Pleurex removed 5/19.   We referred her to Dr. Erenest Rasheramille Frazier-Mills at [facility] and underwent Tilt Table testing 05/2016 that revealed autonomic failure.  Her BP dropped to 55/32.  She was started on mestanon but has been unable to tolerate it due to diarrhea.  She was also referred for a genetic evaluation given her long QT and SCD.  [facility] showed EF 38% with diffuse myocardial as well as atrial enhancement concerning for infiltrative disease such as amyloid.   Tc-PYP scan 04/28/17 was strongly positive (qualitative = 3, H/CL > 1.5) c/w ATTR-CA. 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 Her family history is also notable for cardiomyopathy in her mother and maternal aunt at age 31~60 yo and may suggest they had hereditary ATTR-CM as well. [facility] Carily[facility]ureKhouri [facility] and plan was for her to start patisiran here in [place].   She presents today for regular follow up. Continues on patisiran every 3 weeks. Says for the first week and a half after the infusion she feels the neuropathy is worse then it gets better and she feels like she is stronger and she can  do ADLs. Says she feels more SOB and retaining more fluid in her abdomen. Taking torsemide 20 daily. Mild orthopnea. Weight up 10 pounds over the last month. Taking midodrine 3x/day. No dizziness.   ICD interrogated personally: No VT/AF. Volume status mildly elevated but not above threshold. Personally reviewed   Studies:  LHC 07/08/15:  Severe mitral regurgitation with large V waves and 4+ MR  There is mild to  moderate left ventricular systolic dysfunction. With cardiac output/index moderate-severely reduced.  Angiographically normal coronary arteries    Past Medical History:  Diagnosis Date  . AICD (automatic cardioverter/defibrillator) present   . Anemia   . Anxiety   . Aortic aneurysm (HCC)   . Arthritis   . Cardiac arrest (HCC)   . Chronic combined systolic and diastolic CHF (congestive heart failure) (HCC) 07/03/2015   Grade 3 diastolic dysfunction.  LVEF 40-45% 05/2015.  Marland Kitchen Depression    SOME DEPRESSION (ON MEDS FOR 2 YRS) WHEN HER MOTHER PASSED  . Patent ductus arteriosus 07/16/2015  . 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 07/22/2015   26 mm Sorin Memo 3D ring annuloplasty with ligation of patent ductus arteriosus and clipping of LA appendage  . Severe mitral regurgitation 07/03/2015  . Shortness of breath dyspnea     Past Surgical History:  Procedure Laterality Date  . CARDIAC CATHETERIZATION N/A 07/08/2015   Procedure: Right/Left Heart Cath and Coronary Angiography;  Surgeon: Marykay Lex, MD;  Location: [facility] INVASIVE CV LAB;  Service: Cardiovascular;  Laterality: N/A;  . CHEST TUBE INSERTION Left 10/21/2015   Procedure: INSERTION left PLEURAL DRAINAGE CATHETER;  Surgeon: Purcell Nails, MD;  Location: [facility] OR;  Service: Thoracic;  Laterality: Left;  . CLIPPING OF ATRIAL APPENDAGE  07/22/2015   Procedure: CLIPPING OF LEFT ATRIAL APPENDAGE;  Surgeon: Purcell Nails, MD;  Location: [facility] OR;  Service: Open Heart Surgery;;  35 [address] Pro  . COLONOSCOPY    . EP IMPLANTABLE DEVICE N/A 08/15/2015   Procedure: ICD Implant;  Surgeon: Will Jorja Loa, MD;  Location: [facility] INVASIVE CV LAB;  Service: Cardiovascular;  Laterality: N/A;  . MICROLARYNGOSCOPY WITH CO2 LASER AND EXCISION OF VOCAL CORD LESION N/A 11/04/2015   Procedure: MICROLARYNGOSCOPY, RADIESSE VOCAL CORD AUGMENTATION;  Surgeon: Flo Shanks, MD;  Location: [facility] OR;  Service: ENT;  Laterality: N/A;  .  MITRAL VALVE REPAIR N/A 07/22/2015   Procedure: MITRAL VALVE REPAIR;  Surgeon: Purcell Nails, MD;  Location: [facility] OR;  Service: Open Heart Surgery;  Laterality: N/A;  26 Sorin Memo 3D  . OVARIAN CYST REMOVAL    . PATENT DUCTUS ARTERIOUS REPAIR N/A 07/22/2015   Procedure: PATENT DUCTUS ARTERIOSUS (PDA) ligation;  Surgeon: Purcell Nails, MD;  Location: [facility] OR;  Service: Open Heart Surgery;  Laterality: N/A;  . REMOVAL OF PLEURAL DRAINAGE CATHETER Left 11/25/2017   Procedure: REMOVAL OF PLEURAL DRAINAGE CATHETER;  Surgeon: Purcell Nails, MD;  Location: [facility] OR;  Service: Thoracic;  Laterality: Left;  . TEE WITHOUT CARDIOVERSION N/A 07/07/2015   Procedure: TRANSESOPHAGEAL ECHOCARDIOGRAM (TEE);  Surgeon: Chilton Si, MD;  Location: [facility] ENDOSCOPY;  Service: Cardiovascular;  Laterality: N/A;  . TEE WITHOUT CARDIOVERSION N/A 07/22/2015   Procedure: TRANSESOPHAGEAL ECHOCARDIOGRAM (TEE);  Surgeon: Purcell Nails, MD;  Location: [facility] OR;  Service: Open Heart Surgery;  Laterality: N/A;     Current Outpatient Medications  Medication Sig Dispense Refill  . aspirin EC 81 MG tablet Take 81 mg by mouth daily at 3 pm.    .  CORLANOR 5 MG TABS tablet TAKE 1 TABLET (5 MG TOTAL) BY MOUTH 2 (TWO) TIMES DAILY WITH A MEAL. 180 tablet 1  . midodrine (PROAMATINE) 10 MG tablet Take 1.5 tablets (15 mg total) by mouth 3 (three) times daily with meals. 405 tablet 3  . Polyethyl Glycol-Propyl Glycol (LUBRICANT EYE DROPS) 0.4-0.3 % SOLN Place 1 drop into both eyes 2 (two) times daily.    . potassium chloride (K-DUR) 10 MEQ tablet TAKE 1 TABLET BY MOUTH EVERY DAY 90 tablet 2  . torsemide (DEMADEX) 20 MG tablet TAKE 1 TABLET BY MOUTH EVERY DAY 90 tablet 2  . vitamin A 12878 UNIT capsule Take 1 capsule (10,000 Units total) by mouth daily.     No current facility-administered medications for this encounter.     Allergies:   Isopropyl alcohol; Vicodin [hydrocodone-acetaminophen]; Benadryl [diphenhydramine]; Adhesive [tape];  Cymbalta [duloxetine hcl]; Oxycodone; and Silicone    Social History:  The patient  reports that she has never smoked. She has never used smokeless tobacco. She reports that she does not drink alcohol or use drugs.   Family History:  The patient's family history includes Diabetes in her father; Heart disease in her mother; Hypertension in her mother; Stomach cancer in her father.   Review of systems complete and found to be negative unless listed in HPI.    PHYSICAL EXAM:  Vitals:   07/26/18 1348  BP: 120/82  Pulse: 67  SpO2: 99%  Weight: 45 kg (99 lb 3.2 oz)    Wt Readings from Last 3 Encounters:  07/26/18 45 kg (99 lb 3.2 oz)  07/21/18 44.9 kg (99 lb)  07/06/18 40.8 kg (90 lb)   Physical Exam General:Thin, chronically ill appearing. In WC HEENT: Normal Neck: Supple. JVP 10 cm + prominent CV waves  Carotids 2+ bilat; no bruits. No thyromegaly or nodule noted. Cor: PMI nondisplaced. RRR, 2/6 TR Lungs: CTAB, normal effort. Abdomen: Soft, non-tender, + distended, no HSM. No bruits or masses. +BS  Extremities: No cyanosis, clubbing, or rash. Trace edema.  Neuro: Alert & orientedx3, cranial nerves grossly intact. moves all 4 extremities w/o difficulty. Affect flat but appropriate.   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 S/p ICD for SCD. Also with diffuse delaying enhancement concerning for infiltrative disease.  - PYP scan markedly positive for TTR-amyloid.  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 - Continues with NYHA IIIB in setting of TTR amyloid with extensive cardiac involvement. - She has been seen at the [facility] at [facility] (appreciate their input greatly) and the decision to pursue patisiran therapy in  [place] has been made.  - Started patisiran 7/19. Will continue. Repeat echo - Volume status mildly elevated on exam and by ICD interrogation. Will increase torsemide to 40 mg daily for 3 days and go back down. Take KCL with every extra dose. Add spiro 12.5. Repeat BMET 1 week.  - Reinforced need for daily weights and reviewed use of sliding scale diuretics. - BP has been too soft for BB or ACE/ARB/ARNI.  - Continue ivabradine 5 mg BID. Unable to tolerate higher dose.  - Continue midodrine at 15mg  bid. Lorin Picket has been stopped  - ICD interrogated personally as above.   2) Recurrent left pleural effusion - Pleurex catheter out in  May 2019. Minimal left effusion by CXR 02/16/18 - Followed by Dr. Cornelius Moras - Lungs clear today  3) Mitral regurgitation s/p mitral valve repair:    - Stable on Echo 11/2016 s/p MV repair. Aware of SBE prophylaxis  - Will repeat echo  4) Paroxysmal atrial fibrillation:  - Remains in NSR on exam. No bleeding on coumadin LA appendage was clipped in surgery. - The patients CHA2DS2-VASc Score and unadjusted Ischemic Stroke Rate (% per year) is equal to 2.2 % stroke rate/year from a score of 2 .   5) Orthostatic hypotension:  - She has been evaluated by Dr. Read Drivers at [facility] with markedly + tilt table. - Continue midodrine. She could not tolerate mestinon. 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 Improving with patisiran - No change to current plan.    Arvilla Meres, MD  1:56 PM

## 2018-07-26 NOTE — Telephone Encounter (Signed)
CSW consulted to speak with pt at pt request.  Pt wishes to discuss long term care options as she is getting progressively weak and does not have anyone to help her during the day.  Pt reports that she uses at walker, cane, or wheelchair depending on the situation and requires assistance with bathing.    Pt initially inquiring about ALF- CSW explained need for Medicaid for pt since she only makes $880/month and would need additional payor source.  CSW also discussed referral to [facility]- patient feels as if this might be more what she needs at this time and is agreeable to referral being placed.  Referral made to [facility] who will follow up with patient directly to discuss further.  CSW will continue to follow and assist as needed  Burna Sis, LCSW Clinical Social Worker [facility] [phone]

## 2018-07-26 NOTE — Progress Notes (Signed)
error 

## 2018-07-26 NOTE — Patient Instructions (Addendum)
Labs done today  Labs will need to be done in 1 week  Your physician has requested that you have an echocardiogram. Echocardiography is a painless test that uses sound waves to create images of your heart. It provides your doctor with information about the size and shape of your heart and how well your heart's chambers and valves are working. This procedure takes approximately one hour. There are no restrictions for this procedure.  START Spironolactone 12.5mg  (0.5 tab) daily  INCREASE Torsemide 40mg  (2 tabs) for 3 days ONLY. Take an additional dose of potassium 20mg  (2 tabs) for 3 days only.  Follow up with Dr. Gala Romney in 2-3 months

## 2018-07-27 ENCOUNTER — Ambulatory Visit (HOSPITAL_COMMUNITY)
Admission: RE | Admit: 2018-07-27 | Discharge: 2018-07-27 | Disposition: A | Discharge status: Home / Self Care | Payer: Medicare HMO | Source: Ambulatory Visit | Attending: Internal Medicine | Admitting: Internal Medicine

## 2018-07-27 DIAGNOSIS — E854 Organ-limited amyloidosis: Secondary | ICD-10-CM | POA: Diagnosis present

## 2018-07-27 DIAGNOSIS — I43 Cardiomyopathy in diseases classified elsewhere: Secondary | ICD-10-CM | POA: Insufficient documentation

## 2018-07-27 MED ORDER — FAMOTIDINE IN NACL 20-0.9 MG/50ML-% IV SOLN
INTRAVENOUS | Status: AC
  Administered 2018-07-27: 20 mg via INTRAVENOUS
  Filled 2018-07-27: qty 50

## 2018-07-27 MED ORDER — ACETAMINOPHEN 325 MG PO TABS
ORAL_TABLET | ORAL | Status: AC
  Administered 2018-07-27: 650 mg via ORAL
  Filled 2018-07-27: qty 2

## 2018-07-27 MED ORDER — METHYLPREDNISOLONE SODIUM SUCC 125 MG IJ SOLR
80.0000 mg | Freq: Once | INTRAMUSCULAR | Status: AC
  Administered 2018-07-27: 80 mg via INTRAVENOUS

## 2018-07-27 MED ORDER — METHYLPREDNISOLONE SODIUM SUCC 125 MG IJ SOLR
INTRAMUSCULAR | Status: AC
  Administered 2018-07-27: 80 mg via INTRAVENOUS
  Filled 2018-07-27: qty 2

## 2018-07-27 MED ORDER — FAMOTIDINE IN NACL 20-0.9 MG/50ML-% IV SOLN
20.0000 mg | Freq: Once | INTRAVENOUS | Status: AC
  Administered 2018-07-27: 20 mg via INTRAVENOUS

## 2018-07-27 MED ORDER — LORATADINE 10 MG PO TABS
ORAL_TABLET | ORAL | Status: AC
  Administered 2018-07-27: 10 mg via ORAL
  Filled 2018-07-27: qty 1

## 2018-07-27 MED ORDER — ACETAMINOPHEN 325 MG PO TABS
650.0000 mg | ORAL_TABLET | Freq: Once | ORAL | Status: AC
  Administered 2018-07-27: 650 mg via ORAL

## 2018-07-27 MED ORDER — LORATADINE 10 MG PO TABS
10.0000 mg | ORAL_TABLET | Freq: Once | ORAL | Status: AC
  Administered 2018-07-27: 10 mg via ORAL

## 2018-07-27 MED ORDER — SODIUM CHLORIDE 0.9 % IV SOLN
0.3000 mg/kg | Freq: Once | INTRAVENOUS | Status: AC
  Administered 2018-07-27: 14 mg via INTRAVENOUS
  Filled 2018-07-27: qty 7

## 2018-08-02 ENCOUNTER — Ambulatory Visit (HOSPITAL_COMMUNITY)
Admission: RE | Admit: 2018-08-02 | Discharge: 2018-08-02 | Disposition: A | Discharge status: Home / Self Care | Payer: Medicare HMO | Source: Ambulatory Visit | Attending: Cardiology | Admitting: Cardiology

## 2018-08-02 DIAGNOSIS — I43 Cardiomyopathy in diseases classified elsewhere: Secondary | ICD-10-CM

## 2018-08-02 DIAGNOSIS — E854 Organ-limited amyloidosis: Secondary | ICD-10-CM | POA: Insufficient documentation

## 2018-08-02 LAB — BASIC METABOLIC PANEL
Anion gap: 12 (ref 5–15)
BUN: 25 mg/dL — AB (ref 6–20)
CO2: 29 mmol/L (ref 22–32)
CREATININE: 0.87 mg/dL (ref 0.44–1.00)
Calcium: 9 mg/dL (ref 8.9–10.3)
Chloride: 97 mmol/L — ABNORMAL LOW (ref 98–111)
GFR calc Af Amer: >60 mL/min (ref >60)
GFR calc non Af Amer: >60 mL/min (ref >60)
Glucose, Bld: 92 mg/dL (ref 70–99)
Potassium: 4.7 mmol/L (ref 3.5–5.1)
Sodium: 138 mmol/L (ref 135–145)

## 2018-08-16 ENCOUNTER — Other Ambulatory Visit (HOSPITAL_COMMUNITY): Payer: Self-pay

## 2018-08-16 DIAGNOSIS — E854 Organ-limited amyloidosis: Secondary | ICD-10-CM

## 2018-08-16 DIAGNOSIS — I43 Cardiomyopathy in diseases classified elsewhere: Secondary | ICD-10-CM

## 2018-08-16 NOTE — Progress Notes (Signed)
Orders placed for pattisiran infusion.

## 2018-08-17 ENCOUNTER — Other Ambulatory Visit (HOSPITAL_COMMUNITY): Payer: Self-pay

## 2018-08-17 ENCOUNTER — Ambulatory Visit (HOSPITAL_COMMUNITY)
Admission: RE | Admit: 2018-08-17 | Discharge: 2018-08-17 | Disposition: A | Discharge status: Home / Self Care | Payer: Medicare HMO | Source: Ambulatory Visit | Attending: Internal Medicine | Admitting: Internal Medicine

## 2018-08-17 VITALS — BP 94/61 | HR 60 | Temp 98.6°F | Resp 20 | Ht 61.0 in | Wt 95.0 lb

## 2018-08-17 DIAGNOSIS — I43 Cardiomyopathy in diseases classified elsewhere: Secondary | ICD-10-CM

## 2018-08-17 DIAGNOSIS — E854 Organ-limited amyloidosis: Secondary | ICD-10-CM

## 2018-08-17 MED ORDER — LORATADINE 10 MG PO TABS
ORAL_TABLET | ORAL | Status: AC
  Administered 2018-08-17: 10 mg via ORAL
  Filled 2018-08-17: qty 1

## 2018-08-17 MED ORDER — FAMOTIDINE IN NACL 20-0.9 MG/50ML-% IV SOLN: 20.0000 mg | Freq: Once | INTRAVENOUS | Status: DC

## 2018-08-17 MED ORDER — FAMOTIDINE IN NACL 20-0.9 MG/50ML-% IV SOLN
20.0000 mg | Freq: Once | INTRAVENOUS | Status: AC
  Administered 2018-08-17: 20 mg via INTRAVENOUS

## 2018-08-17 MED ORDER — METHYLPREDNISOLONE SODIUM SUCC 125 MG IJ SOLR
80.0000 mg | Freq: Once | INTRAMUSCULAR | Status: AC
  Administered 2018-08-17: 80 mg via INTRAVENOUS

## 2018-08-17 MED ORDER — SODIUM CHLORIDE 0.9 % IV SOLN: 12.0000 mg | Freq: Once | INTRAVENOUS | Status: DC

## 2018-08-17 MED ORDER — LORATADINE 10 MG PO TABS: 10.0000 mg | ORAL_TABLET | Freq: Once | ORAL | Status: DC

## 2018-08-17 MED ORDER — METHYLPREDNISOLONE SODIUM SUCC 125 MG IJ SOLR: 80.0000 mg | Freq: Once | INTRAMUSCULAR | Status: DC

## 2018-08-17 MED ORDER — SODIUM CHLORIDE 0.9 % IV SOLN: 30.0000 mg | Freq: Once | INTRAVENOUS | Status: DC

## 2018-08-17 MED ORDER — LORATADINE 10 MG PO TABS
10.0000 mg | ORAL_TABLET | Freq: Once | ORAL | Status: AC
  Administered 2018-08-17: 10 mg via ORAL

## 2018-08-17 MED ORDER — ACETAMINOPHEN 325 MG PO TABS
ORAL_TABLET | ORAL | Status: AC
  Administered 2018-08-17: 650 mg via ORAL
  Filled 2018-08-17: qty 2

## 2018-08-17 MED ORDER — METHYLPREDNISOLONE SODIUM SUCC 125 MG IJ SOLR
INTRAMUSCULAR | Status: AC
  Administered 2018-08-17: 80 mg via INTRAVENOUS
  Filled 2018-08-17: qty 2

## 2018-08-17 MED ORDER — ACETAMINOPHEN 325 MG PO TABS: 650.0000 mg | ORAL_TABLET | Freq: Once | ORAL | Status: DC

## 2018-08-17 MED ORDER — SODIUM CHLORIDE 0.9 % IV SOLN
0.3000 mg/kg | Freq: Once | INTRAVENOUS | Status: AC
  Administered 2018-08-17: 12 mg via INTRAVENOUS
  Filled 2018-08-17: qty 6

## 2018-08-17 MED ORDER — FAMOTIDINE IN NACL 20-0.9 MG/50ML-% IV SOLN
INTRAVENOUS | Status: AC
  Administered 2018-08-17: 20 mg via INTRAVENOUS
  Filled 2018-08-17: qty 50

## 2018-08-17 MED ORDER — ACETAMINOPHEN 325 MG PO TABS
650.0000 mg | ORAL_TABLET | Freq: Once | ORAL | Status: AC
  Administered 2018-08-17: 650 mg via ORAL

## 2018-08-21 ENCOUNTER — Other Ambulatory Visit (HOSPITAL_COMMUNITY): Payer: Self-pay

## 2018-09-07 ENCOUNTER — Other Ambulatory Visit (HOSPITAL_COMMUNITY): Payer: Self-pay | Admitting: *Deleted

## 2018-09-07 ENCOUNTER — Inpatient Hospital Stay (HOSPITAL_COMMUNITY): Admission: RE | Admit: 2018-09-07 | Payer: Medicare HMO | Source: Ambulatory Visit

## 2018-09-07 ENCOUNTER — Other Ambulatory Visit (HOSPITAL_COMMUNITY): Payer: Self-pay

## 2018-09-07 DIAGNOSIS — E854 Organ-limited amyloidosis: Secondary | ICD-10-CM

## 2018-09-07 DIAGNOSIS — I43 Cardiomyopathy in diseases classified elsewhere: Secondary | ICD-10-CM

## 2018-09-07 MED ORDER — LORATADINE 10 MG PO TABS: 10.0000 mg | ORAL_TABLET | Freq: Once | ORAL | Status: DC

## 2018-09-07 MED ORDER — SODIUM CHLORIDE 0.9 % IV SOLN: 0.3000 mg/kg | Freq: Once | INTRAVENOUS | Status: DC

## 2018-09-07 MED ORDER — FAMOTIDINE IN NACL 20-0.9 MG/50ML-% IV SOLN: 20.0000 mg | Freq: Once | INTRAVENOUS | Status: DC

## 2018-09-07 MED ORDER — ACETAMINOPHEN 325 MG PO TABS: 650.0000 mg | ORAL_TABLET | Freq: Once | ORAL | Status: DC

## 2018-09-07 MED ORDER — METHYLPREDNISOLONE SODIUM SUCC 125 MG IJ SOLR: 80.0000 mg | Freq: Once | INTRAMUSCULAR | Status: DC

## 2018-09-07 NOTE — Progress Notes (Signed)
Orders placed for patisseran

## 2018-09-14 ENCOUNTER — Ambulatory Visit (HOSPITAL_COMMUNITY)
Admission: RE | Admit: 2018-09-14 | Discharge: 2018-09-14 | Disposition: A | Discharge status: Home / Self Care | Payer: Medicare HMO | Source: Ambulatory Visit | Attending: Internal Medicine | Admitting: Internal Medicine

## 2018-09-14 DIAGNOSIS — I43 Cardiomyopathy in diseases classified elsewhere: Secondary | ICD-10-CM | POA: Insufficient documentation

## 2018-09-14 DIAGNOSIS — E854 Organ-limited amyloidosis: Secondary | ICD-10-CM | POA: Diagnosis present

## 2018-09-14 MED ORDER — FAMOTIDINE IN NACL 20-0.9 MG/50ML-% IV SOLN
20.0000 mg | Freq: Once | INTRAVENOUS | Status: AC
  Administered 2018-09-14: 20 mg via INTRAVENOUS

## 2018-09-14 MED ORDER — FAMOTIDINE IN NACL 20-0.9 MG/50ML-% IV SOLN
INTRAVENOUS | Status: AC
  Filled 2018-09-14: qty 50

## 2018-09-14 MED ORDER — METHYLPREDNISOLONE SODIUM SUCC 125 MG IJ SOLR: 80.0000 mg | Freq: Once | INTRAMUSCULAR | Status: DC

## 2018-09-14 MED ORDER — SODIUM CHLORIDE 0.9 % IV SOLN
0.3000 mg/kg | Freq: Once | INTRAVENOUS | Status: AC
  Administered 2018-09-14: 12 mg via INTRAVENOUS
  Filled 2018-09-14: qty 6

## 2018-09-14 MED ORDER — LORATADINE 10 MG PO TABS: 10.0000 mg | ORAL_TABLET | Freq: Once | ORAL | Status: DC

## 2018-09-14 MED ORDER — ACETAMINOPHEN 325 MG PO TABS: 650.0000 mg | ORAL_TABLET | Freq: Once | ORAL | Status: DC

## 2018-09-14 MED ORDER — LORATADINE 10 MG PO TABS
ORAL_TABLET | ORAL | Status: AC
  Administered 2018-09-14: 10 mg
  Filled 2018-09-14: qty 1

## 2018-09-14 MED ORDER — METHYLPREDNISOLONE SODIUM SUCC 40 MG IJ SOLR
INTRAMUSCULAR | Status: AC
  Administered 2018-09-14: 80 mg
  Filled 2018-09-14: qty 2

## 2018-09-14 MED ORDER — ACETAMINOPHEN 325 MG PO TABS
ORAL_TABLET | ORAL | Status: AC
  Administered 2018-09-14: 650 mg
  Filled 2018-09-14: qty 2

## 2018-09-25 ENCOUNTER — Ambulatory Visit (HOSPITAL_COMMUNITY)
Admission: RE | Admit: 2018-09-25 | Discharge: 2018-09-25 | Disposition: A | Discharge status: Home / Self Care | Payer: Medicare HMO | Source: Ambulatory Visit | Attending: Internal Medicine | Admitting: Internal Medicine

## 2018-09-25 ENCOUNTER — Encounter (HOSPITAL_COMMUNITY): Payer: Self-pay | Admitting: Internal Medicine

## 2018-09-25 ENCOUNTER — Ambulatory Visit (HOSPITAL_BASED_OUTPATIENT_CLINIC_OR_DEPARTMENT_OTHER)
Admission: RE | Admit: 2018-09-25 | Discharge: 2018-09-25 | Disposition: A | Discharge status: Home / Self Care | Payer: Medicare HMO | Source: Ambulatory Visit | Attending: Internal Medicine | Admitting: Internal Medicine

## 2018-09-25 VITALS — BP 108/74 | HR 69 | Wt 92.4 lb

## 2018-09-25 DIAGNOSIS — I480 Paroxysmal atrial fibrillation: Secondary | ICD-10-CM | POA: Diagnosis not present

## 2018-09-25 DIAGNOSIS — E8582 Wild-type transthyretin-related (ATTR) amyloidosis: Secondary | ICD-10-CM

## 2018-09-25 DIAGNOSIS — Z95810 Presence of automatic (implantable) cardiac defibrillator: Secondary | ICD-10-CM | POA: Diagnosis not present

## 2018-09-25 DIAGNOSIS — E851 Neuropathic heredofamilial amyloidosis: Secondary | ICD-10-CM | POA: Diagnosis not present

## 2018-09-25 DIAGNOSIS — Z888 Allergy status to other drugs, medicaments and biological substances status: Secondary | ICD-10-CM | POA: Insufficient documentation

## 2018-09-25 DIAGNOSIS — Z79899 Other long term (current) drug therapy: Secondary | ICD-10-CM | POA: Diagnosis not present

## 2018-09-25 DIAGNOSIS — I5022 Chronic systolic (congestive) heart failure: Secondary | ICD-10-CM | POA: Diagnosis not present

## 2018-09-25 DIAGNOSIS — Z8674 Personal history of sudden cardiac arrest: Secondary | ICD-10-CM | POA: Insufficient documentation

## 2018-09-25 DIAGNOSIS — I43 Cardiomyopathy in diseases classified elsewhere: Secondary | ICD-10-CM | POA: Diagnosis not present

## 2018-09-25 DIAGNOSIS — Z8249 Family history of ischemic heart disease and other diseases of the circulatory system: Secondary | ICD-10-CM | POA: Diagnosis not present

## 2018-09-25 DIAGNOSIS — Z800 Family history of malignant neoplasm of digestive organs: Secondary | ICD-10-CM | POA: Diagnosis not present

## 2018-09-25 DIAGNOSIS — Z833 Family history of diabetes mellitus: Secondary | ICD-10-CM | POA: Insufficient documentation

## 2018-09-25 DIAGNOSIS — M1990 Unspecified osteoarthritis, unspecified site: Secondary | ICD-10-CM | POA: Diagnosis not present

## 2018-09-25 DIAGNOSIS — Z885 Allergy status to narcotic agent status: Secondary | ICD-10-CM | POA: Insufficient documentation

## 2018-09-25 DIAGNOSIS — E854 Organ-limited amyloidosis: Secondary | ICD-10-CM

## 2018-09-25 DIAGNOSIS — J90 Pleural effusion, not elsewhere classified: Secondary | ICD-10-CM | POA: Insufficient documentation

## 2018-09-25 DIAGNOSIS — Z7982 Long term (current) use of aspirin: Secondary | ICD-10-CM | POA: Diagnosis not present

## 2018-09-25 DIAGNOSIS — G63 Polyneuropathy in diseases classified elsewhere: Secondary | ICD-10-CM | POA: Insufficient documentation

## 2018-09-25 DIAGNOSIS — I081 Rheumatic disorders of both mitral and tricuspid valves: Secondary | ICD-10-CM | POA: Diagnosis not present

## 2018-09-25 DIAGNOSIS — I5042 Chronic combined systolic (congestive) and diastolic (congestive) heart failure: Secondary | ICD-10-CM | POA: Insufficient documentation

## 2018-09-25 DIAGNOSIS — I951 Orthostatic hypotension: Secondary | ICD-10-CM | POA: Insufficient documentation

## 2018-09-25 NOTE — Progress Notes (Signed)
Referral called into [facility]), they will try to see pt today if not first thing in the AM.

## 2018-09-25 NOTE — Addendum Note (Signed)
Encounter addended by: Noralee Space, RN on: 09/25/2018 10:30 AM  Actions taken: Clinical Note Signed

## 2018-09-25 NOTE — Progress Notes (Signed)
  Echocardiogram 2D Echocardiogram has been performed.  Diane [name] 09/25/2018, 10:02 AM

## 2018-09-25 NOTE — Patient Instructions (Signed)
You have been referred to [facility]) they will contact you to schedule a time to come out to your home.

## 2018-09-25 NOTE — Progress Notes (Signed)
[name], female   DOB: [birth year], 58 y.o.   MRN: [id number]    Advanced Heart Failure Clinic Note   Date:  09/25/2018   ID:  DASHELL RYON, DOB [birth year], MRN [id number]  PCP:  Deeann Saint, MD  Cardiologist:   Dr. Duke Salvia Electrophysiologist: Dr. Berton Mount   [name] is a 58 y.o. female who is referred to the HF Clinic by Dr. Duke Salvia for further evaluation of her chronic systolic and diastolic heart failure and possible infiltrative CM.  Symptoms started in 2016 with syncope and DOE. Referred to Dr. Duke Salvia in 12/16. Echocardiography revealed severe mitral regurgitation and a reduced LVEF (45-50%).  The MR was due to tethering of the posterior leaflet.  She was also noted to have grade 3 diastolic dysfunction with mid basal to mid inferolateral wall hypokinesis.  On 07/22/15 underwent repair of her mitral valve with a 26 mm Sorin Memo 3D annuloplasty ring on 07/22/15 by Dr. Cornelius Moras.  Intra-operatively she was noted to have a PDA with left to right shunting that was ligated in the OR. The left atrial appendage was also clipped.   On 08/05/15 she suffered a cardiac arrest at home and was resuscitated by EMS.  The initial rhythm was unclear but she had VT upon arrival in the ED, which was followed by atrial fibrillation.  She was treated with hypothermia and had prolonged hospital course. Fortunately she has no residual cognitive deficits.  During that hospitalization she underwent cardiac MRI with LVEF 45% with hypokinesis of the distal septum and apex.  There was diffuse, delayed enhancement in the subendocardium and mid epicardial region, most prominent in the inferior wall.  This was concerning for myocarditis or infiltrative cardiomyopathy.  Her EKG showed QT prolongation and she had an ICD placed for secondary prevention.  Her hospitalization was delayed by severe orthostatic hypotension and recurrent syncope requiring high doses of florinef and midodrine  which were weaned down.  She was discharged home but was readmitted with volume overload on 2/16.  Echo showed LVEF 35% with diffuse hypokinesis and her mitral valve was functioning well.    She underwent Pleurex catheter placement for recurrent left pleural effusion on 10/20/16. Pleurex removed 5/19.   We referred her to Dr. Erenest Rasher at [facility] and underwent Tilt Table testing 05/2016 that revealed autonomic failure.  Her BP dropped to 55/32.  She was started on mestanon but has been unable to tolerate it due to diarrhea.  She was also referred for a genetic evaluation given her long QT and SCD.  Repeat cMRI in 9/18 at [facility] showed EF 38% with diffuse myocardial as well as atrial enhancement concerning for infiltrative disease such as amyloid.   Tc-PYP scan 04/28/17 was strongly positive (qualitative = 3, H/CL > 1.5) c/w ATTR-CA. 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 Her family history is also notable for cardiomyopathy in her mother and maternal aunt at age ~30 yo and may suggest they had hereditary ATTR-CM as well. Saw Dr. Carilyn Goodpasture at [facility] and plan was for her to start patisiran here in [place].   She presents today for regular follow up. Continues on patisiran every 3 weeks. Says she doesn't feel the infusions are helping any more. Says she is more SOB. Also notes her neuropathy is much worse and hurts and she can't walk or stand. Now  unable to eat. Has lost weight. Says she wants to switch to Hospice.  ICD interrogated personally: No VY/AF. Volume status mildly elevated but not above threshold Personally reviewed   Studies:  LHC 07/08/15:  Severe mitral regurgitation with large V waves and 4+ MR  There is mild to moderate left ventricular systolic dysfunction. With cardiac output/index moderate-severely reduced.  Angiographically  normal coronary arteries    Past Medical History:  Diagnosis Date  . AICD (automatic cardioverter/defibrillator) present   . Anemia   . Anxiety   . Aortic aneurysm (HCC)   . Arthritis   . Cardiac arrest (HCC)   . Chronic combined systolic and diastolic CHF (congestive heart failure) (HCC) 07/03/2015   Grade 3 diastolic dysfunction.  LVEF 40-45% 05/2015.  Marland Kitchen Depression    SOME DEPRESSION (ON MEDS FOR 2 YRS) WHEN HER MOTHER PASSED  . Patent ductus arteriosus 07/16/2015  . 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 07/22/2015   26 mm Sorin Memo 3D ring annuloplasty with ligation of patent ductus arteriosus and clipping of LA appendage  . Severe mitral regurgitation 07/03/2015  . Shortness of breath dyspnea     Past Surgical History:  Procedure Laterality Date  . CARDIAC CATHETERIZATION N/A 07/08/2015   Procedure: Right/Left Heart Cath and Coronary Angiography;  Surgeon: Marykay Lex, MD;  Location: [facility] INVASIVE CV LAB;  Service: Cardiovascular;  Laterality: N/A;  . CHEST TUBE INSERTION Left 10/21/2015   Procedure: INSERTION left PLEURAL DRAINAGE CATHETER;  Surgeon: Purcell Nails, MD;  Location: [facility] OR;  Service: Thoracic;  Laterality: Left;  . CLIPPING OF ATRIAL APPENDAGE  07/22/2015   Procedure: CLIPPING OF LEFT ATRIAL APPENDAGE;  Surgeon: Purcell Nails, MD;  Location: [facility] OR;  Service: Open Heart Surgery;;  35 [address] Pro  . COLONOSCOPY    . EP IMPLANTABLE DEVICE N/A 08/15/2015   Procedure: ICD Implant;  Surgeon: Will Jorja Loa, MD;  Location: [facility] INVASIVE CV LAB;  Service: Cardiovascular;  Laterality: N/A;  . MICROLARYNGOSCOPY WITH CO2 LASER AND EXCISION OF VOCAL CORD LESION N/A 11/04/2015   Procedure: MICROLARYNGOSCOPY, RADIESSE VOCAL CORD AUGMENTATION;  Surgeon: Flo Shanks, MD;  Location: [facility] OR;  Service: ENT;  Laterality: N/A;  . MITRAL VALVE REPAIR N/A 07/22/2015   Procedure: MITRAL VALVE REPAIR;  Surgeon: Purcell Nails, MD;  Location: [facility] OR;   Service: Open Heart Surgery;  Laterality: N/A;  26 Sorin Memo 3D  . OVARIAN CYST REMOVAL    . PATENT DUCTUS ARTERIOUS REPAIR N/A 07/22/2015   Procedure: PATENT DUCTUS ARTERIOSUS (PDA) ligation;  Surgeon: Purcell Nails, MD;  Location: [facility] OR;  Service: Open Heart Surgery;  Laterality: N/A;  . REMOVAL OF PLEURAL DRAINAGE CATHETER Left 11/25/2017   Procedure: REMOVAL OF PLEURAL DRAINAGE CATHETER;  Surgeon: Purcell Nails, MD;  Location: [facility] OR;  Service: Thoracic;  Laterality: Left;  . TEE WITHOUT CARDIOVERSION N/A 07/07/2015   Procedure: TRANSESOPHAGEAL ECHOCARDIOGRAM (TEE);  Surgeon: Chilton Si, MD;  Location: [facility] ENDOSCOPY;  Service: Cardiovascular;  Laterality: N/A;  . TEE WITHOUT CARDIOVERSION N/A 07/22/2015   Procedure: TRANSESOPHAGEAL ECHOCARDIOGRAM (TEE);  Surgeon: Purcell Nails, MD;  Location: [facility] OR;  Service: Open Heart Surgery;  Laterality: N/A;     Current Outpatient Medications  Medication Sig Dispense Refill  . aspirin EC 81 MG tablet Take 81 mg by mouth daily at 3 pm.    . CORLANOR 5 MG TABS tablet TAKE 1 TABLET (5 MG TOTAL) BY MOUTH 2 (TWO) TIMES DAILY WITH  A MEAL. 180 tablet 1  . midodrine (PROAMATINE) 10 MG tablet Take 1.5 tablets (15 mg total) by mouth 3 (three) times daily with meals. 405 tablet 3  . Polyethyl Glycol-Propyl Glycol (LUBRICANT EYE DROPS) 0.4-0.3 % SOLN Place 1 drop into both eyes 2 (two) times daily.    . potassium chloride (K-DUR) 10 MEQ tablet Take 1 tablet (10 mEq total) by mouth daily. 90 tablet 2  . spironolactone (ALDACTONE) 25 MG tablet Take 0.5 tablets (12.5 mg total) by mouth daily. 90 tablet 3  . torsemide (DEMADEX) 20 MG tablet Take 1 tablet (20 mg total) by mouth daily. 90 tablet 2  . vitamin A 19147 UNIT capsule Take 1 capsule (10,000 Units total) by mouth daily.     No current facility-administered medications for this encounter.     Allergies:   Isopropyl alcohol; Vicodin [hydrocodone-acetaminophen]; Benadryl [diphenhydramine]; Adhesive  [tape]; Cymbalta [duloxetine hcl]; Oxycodone; and Silicone    Social History:  The patient  reports that she has never smoked. She has never used smokeless tobacco. She reports that she does not drink alcohol or use drugs.   Family History:  The patient's family history includes Diabetes in her father; Heart disease in her mother; Hypertension in her mother; Stomach cancer in her father.   Review of systems complete and found to be negative unless listed in HPI.    PHYSICAL EXAM:  Vitals:   09/25/18 0958  BP: 108/74  Pulse: 69  SpO2: 98%  Weight: 41.9 kg (92 lb 6.4 oz)    Wt Readings from Last 3 Encounters:  09/25/18 41.9 kg (92 lb 6.4 oz)  09/14/18 40.8 kg (90 lb)  08/17/18 43.1 kg (95 lb)   Physical Exam General:Thin, chronically ill appearing. In WC HEENT: normal Neck: supple. no JVP 8-9. Carotids 2+ bilat; no bruits. No lymphadenopathy or thryomegaly appreciated. Cor: PMI nondisplaced. Regular rate & rhythm. No rubs, gallops or murmurs. Lungs: clear Abdomen: soft, nontender, minimally distended. No hepatosplenomegaly. No bruits or masses. Good bowel sounds. Extremities: no cyanosis, clubbing, rash, edema Neuro: alert & orientedx3, cranial nerves grossly intact. moves all 4 extremities w/o difficulty but is weak. 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 S/p ICD for SCD. Also with diffuse delaying enhancement concerning for infiltrative disease.  - PYP scan markedly positive for TTR-amyloid.  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 - Has progressive NYHA IV symptoms in setting of TTR amyloid with extensive cardiac  involvement despite patisiran therapy - Sh would like to stop patisiran and move toward Hospice Care. I agree with plan. Will contact Hospice. Will also need to stop Ivabradine due to cost. - ICD interrogated personally as above  2) Recurrent left pleural effusion - Pleurex catheter out in May 2019. Minimal left effusion by CXR 02/16/18 - Followed by Dr. Cornelius Moras - Lungs clear today  3) Mitral regurgitation s/p mitral valve repair:    - MV repair stable on echo today  4) Paroxysmal atrial fibrillation:  - Remains in NSR on exam. No bleeding on coumadin LA appendage was clipped in surgery. - The patients CHA2DS2-VASc Score and unadjusted Ischemic Stroke Rate (% per year) is equal to 2.2 % stroke rate/year  from a score of 2 .   5) Orthostatic hypotension:  - Continue midodrine as needed for symptomatic improvement  Arvilla Meres, MD  10:17 AM

## 2018-09-26 ENCOUNTER — Telehealth (HOSPITAL_COMMUNITY): Payer: Self-pay | Admitting: *Deleted

## 2018-09-26 DIAGNOSIS — I5022 Chronic systolic (congestive) heart failure: Secondary | ICD-10-CM | POA: Diagnosis not present

## 2018-09-26 NOTE — Telephone Encounter (Signed)
Diane [name] called to let us know she has not been able to get in touch with Diane [name], she ask if we had an alternate number or if we could reach Diane [name] and have her call us.  I called and spoke w/Diane [name], she states her phone has some issues, I provided her with Diane [name]'s name and number and she said she would call her.

## 2018-09-28 ENCOUNTER — Encounter (HOSPITAL_COMMUNITY): Payer: Medicare HMO

## 2018-09-29 ENCOUNTER — Telehealth: Payer: Self-pay | Admitting: Internal Medicine

## 2018-09-29 NOTE — Telephone Encounter (Signed)
LMOVM for Diane [name] requesting call back to the DC. Gave direct number for return call.

## 2018-09-29 NOTE — Telephone Encounter (Signed)
Diane [name], the Pt's Hospice Nurse was calling because the pt wanted to know if we could turn off her defibrilator remotely ASAP. Ready to pass away, wants comfort care

## 2018-10-02 NOTE — Telephone Encounter (Signed)
Spoke with Raynelle Fanning with hospice. She will obtain an order from hospice MD to turn off therapies and fax order to our office. Will plan to then setup industry to go to patient's house if possible. Advised plans may change if industry is unable to go to patient's house for any reason. Will plan to f/u once faxed order is received.  Will route to Dr. Graciela Husbands as [place] Childes.

## 2018-10-03 ENCOUNTER — Telehealth: Payer: Self-pay | Admitting: Internal Medicine

## 2018-10-03 NOTE — Telephone Encounter (Signed)
Ok to deactivate defib per Dr Gala Romney as we referred her to hospice last week.  Attempted to call Raynelle Fanning back and left a mess on her ID VM ok to deactivate, let us know what she needs [place] to do, ?order or to contact rep

## 2018-10-03 NOTE — Telephone Encounter (Signed)
New Message:     Diane [name] wants Dr Graciela Husbands to know that she went to deactivate pt's Defibillator. She needs an orderr from Dr Milas Kocher,  he is not in today.

## 2018-10-03 NOTE — Telephone Encounter (Signed)
Spoke to Hospice, order on record for device inactivation  Will plan to go by today and inactivate high voltage therapies

## 2018-10-03 NOTE — Telephone Encounter (Signed)
Spoke w/Julie, order faxed, Dr Graciela Husbands will go out and deactivate device

## 2018-10-04 NOTE — Telephone Encounter (Signed)
Device inactivated

## 2018-10-05 ENCOUNTER — Encounter (HOSPITAL_COMMUNITY): Payer: Medicare HMO

## 2018-10-13 ENCOUNTER — Telehealth: Payer: Self-pay | Admitting: *Deleted

## 2018-10-13 NOTE — Telephone Encounter (Signed)
Spoke with Raynelle Fanning regarding message received from HF clinic about pt's device beeping. Advised alert tone is likely due to VT/VF detection and/or therapies being programmed off. Pt's device will need to be interrogated and alerts programmed off. Per tech services, device will alert every 6 hours and at programmed alert time. Raynelle Fanning will call the pt to discuss options at this point. Pt is bed-bound. Raynelle Fanning will call back with patient's preferences. Direct number given.

## 2018-11-09 ENCOUNTER — Telehealth (HOSPITAL_COMMUNITY): Payer: Self-pay | Admitting: Licensed Clinical Social Worker

## 2018-11-09 NOTE — Telephone Encounter (Signed)
CSW received call from [employer] requesting information regarding information.  They had received a request from the patients spouse that the pt's son be given a furlough from the military in order to see the patient as she is on hospice and at end of life.  [employer] would need the following information provided to them by a medical professional: 1.diagnosis 2.prognosis 3.current condition 4. Life expectancy 5. Recommendation that service member be present  Medical professional to call [phone] to provide information- case #: [id number]  CSW called pt hospice RN at [facility], [place], and informed her of request- she will follow up regarding pt current condition and confirm with family that this request was made.  CSW will continue to follow and assist as needed  Burna Sis, LCSW Clinical Social Worker Advanced Heart Failure Clinic Desk#: [phone] Cell#: [phone]

## 2018-11-10 ENCOUNTER — Telehealth (HOSPITAL_COMMUNITY): Payer: Self-pay

## 2018-11-10 MED ORDER — TORSEMIDE 20 MG PO TABS: 40.0000 mg | ORAL_TABLET | Freq: Every day | ORAL | 3 refills | Status: AC

## 2018-11-10 NOTE — Telephone Encounter (Signed)
Hospice nurse called with pt complaints of worsening abdominal distention and discomfort. Per Diane Maxin NP-C increase pt Torsemide to 40mg  daily. Relayed order to hospice. Updated med list

## 2018-11-13 ENCOUNTER — Other Ambulatory Visit: Payer: Self-pay

## 2018-11-13 ENCOUNTER — Encounter: Payer: Medicare HMO | Admitting: *Deleted

## 2018-11-22 NOTE — Progress Notes (Signed)
Letter  

## 2018-12-20 ENCOUNTER — Telehealth (HOSPITAL_COMMUNITY): Payer: Self-pay | Admitting: Cardiology

## 2018-12-20 NOTE — Telephone Encounter (Signed)
Diane Fanning, RN with hospice called to report worsening edema, increased SOB, BLE (skin is tight and LLL has a blister full of fluid)  B/p 90/60  Advised HF providers defer to hospice physician for symptom management.  Norberta Keens voiced understanding and will get orders from hospice MD on call

## 2018-12-28 ENCOUNTER — Telehealth (HOSPITAL_COMMUNITY): Payer: Self-pay | Admitting: *Deleted

## 2018-12-28 NOTE — Telephone Encounter (Signed)
Hospice orders signed and faxed 

## 2019-01-15 ENCOUNTER — Telehealth (HOSPITAL_COMMUNITY): Payer: Self-pay

## 2019-01-15 NOTE — Telephone Encounter (Signed)
Signed orders faxed back to hospice at [place] 01/11/2019 

## 2019-02-02 ENCOUNTER — Telehealth (HOSPITAL_COMMUNITY): Payer: Self-pay

## 2019-02-02 NOTE — Telephone Encounter (Signed)
Physician orders and POC signed and faxed to [facility] care confirmation received.

## 2019-03-06 ENCOUNTER — Telehealth (HOSPITAL_COMMUNITY): Payer: Self-pay | Admitting: Cardiology

## 2019-03-06 NOTE — Telephone Encounter (Signed)
Incoming fax from [facility] and palliative care received with documentation that patient expired in  Home on 2019/03/28 @ 0626  Message to provide as [facility]

## 2019-03-07 ENCOUNTER — Telehealth: Payer: Self-pay

## 2019-03-07 NOTE — Telephone Encounter (Signed)
Death certificate received from [facility]. Routed to Dr. Caryl Comes for signature. 03/07/19 vlm

## 2019-03-12 ENCOUNTER — Telehealth (HOSPITAL_COMMUNITY): Payer: Self-pay

## 2019-03-12 NOTE — Telephone Encounter (Signed)
DB signed death cert, faxed to [phone]  Called [phone] for funeral service to pick up original.  Copy placed in folder

## 2019-03-20 DEATH — deceased

## 2022-09-29 NOTE — Telephone Encounter (Signed)
done
